# Patient Record
Sex: Female | Born: 1950 | Race: Black or African American | Hispanic: No | Marital: Married | Smoking: Former smoker
Health system: Southern US, Community
[De-identification: ages and names within clinical notes are randomized; demographics above are authoritative.]

## PROBLEM LIST (undated history)

## (undated) DIAGNOSIS — G4733 Obstructive sleep apnea (adult) (pediatric): Secondary | ICD-10-CM

## (undated) DIAGNOSIS — I051 Rheumatic mitral insufficiency: Secondary | ICD-10-CM

## (undated) DIAGNOSIS — R0600 Dyspnea, unspecified: Secondary | ICD-10-CM

## (undated) DIAGNOSIS — J189 Pneumonia, unspecified organism: Secondary | ICD-10-CM

## (undated) DIAGNOSIS — Z954 Presence of other heart-valve replacement: Secondary | ICD-10-CM

## (undated) DIAGNOSIS — E785 Hyperlipidemia, unspecified: Secondary | ICD-10-CM

## (undated) DIAGNOSIS — I5032 Chronic diastolic (congestive) heart failure: Secondary | ICD-10-CM

## (undated) DIAGNOSIS — Z9889 Other specified postprocedural states: Secondary | ICD-10-CM

## (undated) DIAGNOSIS — Z5189 Encounter for other specified aftercare: Secondary | ICD-10-CM

## (undated) DIAGNOSIS — I480 Paroxysmal atrial fibrillation: Secondary | ICD-10-CM

## (undated) DIAGNOSIS — I2510 Atherosclerotic heart disease of native coronary artery without angina pectoris: Secondary | ICD-10-CM

## (undated) DIAGNOSIS — D689 Coagulation defect, unspecified: Secondary | ICD-10-CM

## (undated) DIAGNOSIS — I272 Pulmonary hypertension, unspecified: Secondary | ICD-10-CM

## (undated) DIAGNOSIS — I639 Cerebral infarction, unspecified: Secondary | ICD-10-CM

## (undated) DIAGNOSIS — Z951 Presence of aortocoronary bypass graft: Secondary | ICD-10-CM

## (undated) DIAGNOSIS — T4145XA Adverse effect of unspecified anesthetic, initial encounter: Secondary | ICD-10-CM

## (undated) DIAGNOSIS — R011 Cardiac murmur, unspecified: Secondary | ICD-10-CM

## (undated) DIAGNOSIS — I050 Rheumatic mitral stenosis: Secondary | ICD-10-CM

## (undated) DIAGNOSIS — Z8679 Personal history of other diseases of the circulatory system: Secondary | ICD-10-CM

## (undated) DIAGNOSIS — I10 Essential (primary) hypertension: Secondary | ICD-10-CM

## (undated) DIAGNOSIS — T8859XA Other complications of anesthesia, initial encounter: Secondary | ICD-10-CM

## (undated) DIAGNOSIS — I4891 Unspecified atrial fibrillation: Secondary | ICD-10-CM

## (undated) HISTORY — PX: COLONOSCOPY W/ POLYPECTOMY: SHX1380

## (undated) HISTORY — DX: Coagulation defect, unspecified: D68.9

## (undated) HISTORY — DX: Rheumatic mitral stenosis: I05.0

## (undated) HISTORY — DX: Personal history of other diseases of the circulatory system: Z86.79

## (undated) HISTORY — DX: Unspecified atrial fibrillation: I48.91

## (undated) HISTORY — DX: Encounter for other specified aftercare: Z51.89

## (undated) HISTORY — PX: TUBAL LIGATION: SHX77

## (undated) HISTORY — DX: Other specified postprocedural states: Z98.890

## (undated) HISTORY — DX: Hyperlipidemia, unspecified: E78.5

## (undated) HISTORY — DX: Chronic diastolic (congestive) heart failure: I50.32

## (undated) HISTORY — DX: Obstructive sleep apnea (adult) (pediatric): G47.33

## (undated) HISTORY — DX: Cardiac murmur, unspecified: R01.1

---

## 1987-05-11 HISTORY — PX: APPENDECTOMY: SHX54

## 2006-05-10 HISTORY — PX: BALLOON VALVULOPLASTY: SHX5741

## 2012-05-10 DIAGNOSIS — D689 Coagulation defect, unspecified: Secondary | ICD-10-CM

## 2012-05-10 HISTORY — DX: Coagulation defect, unspecified: D68.9

## 2012-08-13 ENCOUNTER — Encounter (HOSPITAL_BASED_OUTPATIENT_CLINIC_OR_DEPARTMENT_OTHER): Payer: Self-pay | Admitting: *Deleted

## 2012-08-13 ENCOUNTER — Emergency Department (HOSPITAL_BASED_OUTPATIENT_CLINIC_OR_DEPARTMENT_OTHER)
Admission: EM | Admit: 2012-08-13 | Discharge: 2012-08-13 | Disposition: A | Discharge status: Home / Self Care | Payer: BC Managed Care – PPO | Attending: Emergency Medicine | Admitting: Emergency Medicine

## 2012-08-13 ENCOUNTER — Emergency Department (HOSPITAL_BASED_OUTPATIENT_CLINIC_OR_DEPARTMENT_OTHER): Payer: BC Managed Care – PPO

## 2012-08-13 DIAGNOSIS — R112 Nausea with vomiting, unspecified: Secondary | ICD-10-CM

## 2012-08-13 DIAGNOSIS — M545 Low back pain, unspecified: Secondary | ICD-10-CM | POA: Insufficient documentation

## 2012-08-13 DIAGNOSIS — Z8679 Personal history of other diseases of the circulatory system: Secondary | ICD-10-CM | POA: Insufficient documentation

## 2012-08-13 DIAGNOSIS — Z9089 Acquired absence of other organs: Secondary | ICD-10-CM | POA: Insufficient documentation

## 2012-08-13 DIAGNOSIS — R109 Unspecified abdominal pain: Secondary | ICD-10-CM | POA: Insufficient documentation

## 2012-08-13 DIAGNOSIS — Z79899 Other long term (current) drug therapy: Secondary | ICD-10-CM | POA: Insufficient documentation

## 2012-08-13 DIAGNOSIS — I10 Essential (primary) hypertension: Secondary | ICD-10-CM | POA: Insufficient documentation

## 2012-08-13 DIAGNOSIS — Z8673 Personal history of transient ischemic attack (TIA), and cerebral infarction without residual deficits: Secondary | ICD-10-CM | POA: Insufficient documentation

## 2012-08-13 DIAGNOSIS — Z7901 Long term (current) use of anticoagulants: Secondary | ICD-10-CM | POA: Insufficient documentation

## 2012-08-13 HISTORY — DX: Essential (primary) hypertension: I10

## 2012-08-13 HISTORY — DX: Cerebral infarction, unspecified: I63.9

## 2012-08-13 LAB — URINE MICROSCOPIC-ADD ON

## 2012-08-13 LAB — COMPREHENSIVE METABOLIC PANEL
AST: 44 U/L — ABNORMAL HIGH (ref 0–37)
Albumin: 4.4 g/dL (ref 3.5–5.2)
Alkaline Phosphatase: 108 U/L (ref 39–117)
BUN: 13 mg/dL (ref 6–23)
Chloride: 101 mEq/L (ref 96–112)
Creatinine, Ser: 0.8 mg/dL (ref 0.50–1.10)
Potassium: 3.5 mEq/L (ref 3.5–5.1)
Total Bilirubin: 1.1 mg/dL (ref 0.3–1.2)
Total Protein: 9.9 g/dL — ABNORMAL HIGH (ref 6.0–8.3)

## 2012-08-13 LAB — CBC WITH DIFFERENTIAL/PLATELET
Basophils Absolute: 0 10*3/uL (ref 0.0–0.1)
Basophils Relative: 0 % (ref 0–1)
Eosinophils Absolute: 0 10*3/uL (ref 0.0–0.7)
MCH: 27.8 pg (ref 26.0–34.0)
MCHC: 32.8 g/dL (ref 30.0–36.0)
Neutro Abs: 5.6 10*3/uL (ref 1.7–7.7)
Neutrophils Relative %: 82 % — ABNORMAL HIGH (ref 43–77)
RDW: 14.4 % (ref 11.5–15.5)

## 2012-08-13 LAB — URINALYSIS, ROUTINE W REFLEX MICROSCOPIC
Glucose, UA: NEGATIVE mg/dL
Hgb urine dipstick: NEGATIVE
Leukocytes, UA: NEGATIVE
Specific Gravity, Urine: 1.016 (ref 1.005–1.030)
pH: 6 (ref 5.0–8.0)

## 2012-08-13 LAB — LIPASE, BLOOD: Lipase: 16 U/L (ref 11–59)

## 2012-08-13 MED ORDER — HYDROMORPHONE HCL PF 1 MG/ML IJ SOLN
0.5000 mg | Freq: Once | INTRAMUSCULAR | Status: AC
  Administered 2012-08-13: 0.5 mg via INTRAVENOUS
  Filled 2012-08-13: qty 1

## 2012-08-13 MED ORDER — SODIUM CHLORIDE 0.9 % IV SOLN
1000.0000 mL | Freq: Once | INTRAVENOUS | Status: AC
  Administered 2012-08-13: 1000 mL via INTRAVENOUS

## 2012-08-13 MED ORDER — HYDROCODONE-ACETAMINOPHEN 5-325 MG PO TABS: 1.0000 | ORAL_TABLET | Freq: Three times a day (TID) | ORAL | Status: DC | PRN

## 2012-08-13 MED ORDER — ONDANSETRON HCL 4 MG/2ML IJ SOLN
4.0000 mg | Freq: Once | INTRAMUSCULAR | Status: AC
  Administered 2012-08-13: 4 mg via INTRAVENOUS
  Filled 2012-08-13: qty 2

## 2012-08-13 MED ORDER — METOCLOPRAMIDE HCL 10 MG PO TABS: 10.0000 mg | ORAL_TABLET | Freq: Four times a day (QID) | ORAL | Status: DC | PRN

## 2012-08-13 NOTE — ED Provider Notes (Signed)
History  This chart was scribed for Carmin Muskrat, MD by Roe Coombs, ED Scribe. The patient was seen in room MH01/MH01. Patient's care was started at 1549.   CSN: [id number]  Arrival date & time 08/13/12  1527   First MD Initiated Contact with Patient 08/13/12 1549      Chief Complaint  Patient presents with  . Abdominal Pain    The history is provided by the patient and a relative (daughter). No language interpreter was used.    HPI Comments: Marie Malone is a 62 y.o. female with history of hypertension and stroke who presents to the Emergency Department complaining of constant, moderate, right-sided and epigastric abdominal pain onset this morning. There is associated right lower back pain, nausea and vomiting. She had a couple of episodes of bilious emesis thi smorning and says that abdominal pain improved transiently after these episodes. Patient has been able to tolerate small amounts of water, but has had decreased appetite. She also reports that she has had some mild chest pain when abdominal pain is most severe, but states that this is not uncommon. Patient should be taking Warfarin daily. Daughter reports that patient is not always compliant with medicine regimen. She has a surgical history of angioplasty and appendectomy. There is no history of cholecystectomy. Patient does not smoke or use alcohol.    Past Medical History  Diagnosis Date  . Hypertension   . Stroke   . Rheumatic fever     Past Surgical History  Procedure Laterality Date  . Cardiac surgery    . Appendectomy      History reviewed. No pertinent family history.  History  Substance Use Topics  . Smoking status: Never Smoker   . Smokeless tobacco: Not on file  . Alcohol Use: No    OB History   Grav Para Term Preterm Abortions TAB SAB Ect Mult Living                  Review of Systems A complete 10 system review of systems was obtained and all systems are negative except as noted in the HPI and  PMH.   Allergies  Review of patient's allergies indicates no known allergies.  Home Medications   Current Outpatient Rx  Name  Route  Sig  Dispense  Refill  . BENZTROPINE MESYLATE PO   Oral   Take by mouth.         . METOPROLOL SUCCINATE ER PO   Oral   Take by mouth.         . WARFARIN SODIUM PO   Oral   Take by mouth.           Triage Vitals: BP 189/91  Pulse 82  Temp(Src) 98.9 F (37.2 C) (Oral)  Resp 18  Ht 5\' 2"  (1.575 m)  Wt 184 lb (83.462 kg)  BMI 33.65 kg/m2  SpO2 98%  Physical Exam  Nursing note and vitals reviewed. Constitutional: She is oriented to person, place, and time. She appears well-developed and well-nourished. No distress.  HENT:  Head: Normocephalic and atraumatic.  Eyes: Conjunctivae and EOM are normal.  Cardiovascular: Normal rate and regular rhythm.   Pulmonary/Chest: Effort normal and breath sounds normal. No stridor. No respiratory distress.  Abdominal: Soft. Bowel sounds are normal. She exhibits no distension. There is tenderness in the right upper quadrant and epigastric area.  Musculoskeletal: She exhibits no edema.  Neurological: She is alert and oriented to person, place, and time. No cranial nerve deficit.  Skin: Skin is warm and dry.  Psychiatric: She has a normal mood and affect.    ED Course  Procedures (including critical care time) DIAGNOSTIC STUDIES: Oxygen Saturation is 98% on room air, normal by my interpretation.    COORDINATION OF CARE: 4:09 PM- Patient informed of current plan for treatment and evaluation and agrees with plan at this time. Daughter is bedside.   Labs Reviewed  URINALYSIS, ROUTINE W REFLEX MICROSCOPIC - Abnormal; Notable for the following:    Ketones, ur 15 (*)    Protein, ur 30 (*)    All other components within normal limits  URINE MICROSCOPIC-ADD ON - Abnormal; Notable for the following:    Squamous Epithelial / LPF FEW (*)    Bacteria, UA FEW (*)    Casts HYALINE CASTS (*)    All  other components within normal limits  CBC WITH DIFFERENTIAL  COMPREHENSIVE METABOLIC PANEL  LIPASE, BLOOD    US Abdomen Complete  08/13/2012  *RADIOLOGY REPORT*  Clinical Data:  Abdominal pain and nausea.  COMPLETE ABDOMINAL ULTRASOUND  Comparison:  None.  Findings:  Gallbladder:  No shadowing gallstones or echogenic sludge.  No gallbladder wall thickening or pericholecystic fluid.  No sonographic Murphy's sign according to the ultrasound technologist. Wall thickness is 1.3 mm, within normal limits.  Common bile duct:  Normal in caliber. No biliary ductal dilation. The maximal diameter is 5.9 mm.  Liver:  The parenchyma is somewhat heterogeneous.  There may be mild fatty infiltration.  No discrete lesion is evident.  IVC:  Appears normal.  Pancreas:  Although the pancreas is difficult to visualize in its entirety, no focal pancreatic abnormality is identified.  Spleen:  Normal size and echotexture without focal parenchymal abnormality.  The maximal diameter is 6.7 cm.  Right Kidney:  No hydronephrosis.  Well-preserved cortex.  Normal size and parenchymal echotexture without focal abnormalities. The maximal length is 11.5 cm, within normal limits.  Left Kidney:  No hydronephrosis.  Well-preserved cortex.  Normal size and parenchymal echotexture without focal abnormalities. The maximal length is 9.9 cm, within normal limits.  Abdominal aorta:  Atherosclerotic changes are present without an aneurysm.  IMPRESSION: Negative abdominal ultrasound.   Original Report Authenticated By: San Morelle, M.D.      No diagnosis found.  6:38 PM Patient sleeping  I hope the patient, discussed the results with her and her daughter.  (The patient has capacity to follow up with her physician, here.  The patient has no new complaints, remains in no distress, with no hypoxia, evidence of decompensation.   MDM   I personally performed the services described in this documentation, which was scribed in my presence.  The recorded information has been reviewed and is accurate.   Patient presents with abdominal pain, nausea, vomiting.  Notably on my initial exam, and on subsequent exam she was in no distress, with stable vital signs.  She was mildly hypertensive initially, but this improved.  The patient's labs are notable for mild ketonuria, with no overt evidence of systemic infection.  Given the patient's description of nausea, vomiting, bilious emesis, ultrasound was conducted.  This was unremarkable.  However, this presentation seems most consistent with biliary colic.  The patient is a discharged in stable condition with analgesics, antiemetics, primary care followup for further evaluation and management.  Carmin Muskrat, MD 08/13/12 [phone]

## 2012-08-13 NOTE — ED Notes (Signed)
Pt c/o RLQ pain radiating to flank with N/V/D onset this a.m.

## 2012-08-30 ENCOUNTER — Encounter: Payer: Self-pay | Admitting: Family

## 2012-08-30 ENCOUNTER — Ambulatory Visit (INDEPENDENT_AMBULATORY_CARE_PROVIDER_SITE_OTHER): Payer: BC Managed Care – PPO | Admitting: Family

## 2012-08-30 VITALS — BP 140/100 | HR 96 | Temp 98.3°F | Resp 16 | Ht 63.0 in | Wt 180.1 lb

## 2012-08-30 DIAGNOSIS — Z8619 Personal history of other infectious and parasitic diseases: Secondary | ICD-10-CM

## 2012-08-30 DIAGNOSIS — E785 Hyperlipidemia, unspecified: Secondary | ICD-10-CM

## 2012-08-30 DIAGNOSIS — I10 Essential (primary) hypertension: Secondary | ICD-10-CM

## 2012-08-30 DIAGNOSIS — R079 Chest pain, unspecified: Secondary | ICD-10-CM | POA: Insufficient documentation

## 2012-08-30 DIAGNOSIS — Z8679 Personal history of other diseases of the circulatory system: Secondary | ICD-10-CM

## 2012-08-30 LAB — BASIC METABOLIC PANEL WITH GFR
CO2: 28 mEq/L (ref 19–32)
Calcium: 9.8 mg/dL (ref 8.4–10.5)
Chloride: 107 mEq/L (ref 96–112)
Glucose, Bld: 86 mg/dL (ref 70–99)
Sodium: 142 mEq/L (ref 135–145)

## 2012-08-30 LAB — LIPID PANEL
LDL Cholesterol: 107 mg/dL — ABNORMAL HIGH (ref 0–99)
Total CHOL/HDL Ratio: 3.4 Ratio
VLDL: 17 mg/dL (ref 0–40)

## 2012-08-30 LAB — HEPATIC FUNCTION PANEL
ALT: 16 U/L (ref 0–35)
AST: 22 U/L (ref 0–37)
Alkaline Phosphatase: 79 U/L (ref 39–117)
Bilirubin, Direct: 0.2 mg/dL (ref 0.0–0.3)
Total Bilirubin: 0.7 mg/dL (ref 0.3–1.2)

## 2012-08-30 MED ORDER — METOPROLOL SUCCINATE ER 50 MG PO TB24: 50.0000 mg | ORAL_TABLET | Freq: Every day | ORAL | Status: DC

## 2012-08-30 NOTE — Assessment & Plan Note (Signed)
We do not have any records re: her hx of valvular heart disease. Will request records from [facility].  Obtain 2-D echo.  Add aspirin 325mg . Will refer to cardiology. Defer coumadin therapy to cardiology.

## 2012-08-30 NOTE — Progress Notes (Signed)
Subjective:    Patient ID: Marie Malone, female    DOB: 1950/11/30, 62 y.o.   MRN: [id number]  HPI  Marie Malone is a 62 yr old female who presents today to establish care. 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  She was hospitalized at [facility] for this. She reports that this was followed by several TIA's.    Hx Rheumatic fever/valve problem- she reports that she had a "balloon procedure" for the valve performed at [facility] in 2008. She reports that she has occasional DOE.  She reports occasional chest pain with exertion.   This improves with rest.  She is not currently on coumadin- reports that she is supposed to be on the coumadin.    HTN-  Reports that she was first treated for HTN since in her 53's.       Review of Systems  Constitutional: Negative for unexpected weight change.  HENT: Negative for hearing loss and congestion.   Eyes: Negative for visual disturbance.  Respiratory: Negative for cough.   Cardiovascular:       See HPI  Gastrointestinal: Negative for nausea, vomiting and diarrhea.  Genitourinary: Negative for dysuria and hematuria.  Musculoskeletal: Negative for myalgias and arthralgias.  Skin: Negative for rash.  Neurological: Negative for headaches.  Hematological: Negative for adenopathy.  Psychiatric/Behavioral:       Denies depression/anxiety   Past Medical History  Diagnosis Date  . Hypertension   . Stroke   . Rheumatic fever   . History of rheumatic fever     as a child  . Hyperlipidemia   . Heart murmur     History   Social History  . Marital Status: Married    Spouse Name: N/A    Number of Children: N/A  . Years of Education: N/A   Occupational History  . Not on file.   Social History Main Topics  . Smoking status: Never Smoker   . Smokeless tobacco: Not on file  . Alcohol Use: No  . Drug Use: No  . Sexually Active: Not on file   Other Topics Concern  . Not on file   Social History Narrative    Retired Archivist   Married   She has 2 grown children-   Daughter lives with her   Son lives in [place]   6 grandchildren    Past Surgical History  Procedure Laterality Date  . Cardiac surgery  2008    "balloon surgery at [facility]"  . Appendectomy  1989    Family History  Problem Relation Age of Onset  . Diabetes Mother   . Stroke Mother   . Diabetes Father   . Heart disease Father   . Cancer Father     kidney    No Known Allergies  No current outpatient prescriptions on file prior to visit.   No current facility-administered medications on file prior to visit.    BP 140/100  Pulse 96  Temp(Src) 98.3 F (36.8 C) (Oral)  Resp 16  SpO2 98%       Objective:   Physical Exam  Constitutional: She is oriented to person, place, and time. She appears well-developed and well-nourished. No distress.  HENT:  Head: Normocephalic and atraumatic.  Eyes: No scleral icterus.  Cardiovascular: Normal rate and regular rhythm.   No murmur heard. Pulmonary/Chest: Effort normal and breath sounds normal. No respiratory distress. She has no wheezes. She has no rales. She exhibits  no tenderness.  Musculoskeletal: She exhibits no edema.  Lymphadenopathy:    She has no cervical adenopathy.  Neurological: She is alert and oriented to person, place, and time.  Psychiatric: She has a normal mood and affect. Her behavior is normal. Judgment and thought content normal.          Assessment & Plan:

## 2012-08-30 NOTE — Assessment & Plan Note (Signed)
Will add Toprol XL for BP control.

## 2012-08-30 NOTE — Patient Instructions (Addendum)
Complete lab work prior to leaving. You will be contacted about your stress test, echocardiogram, and referral to cardiology. Go to ER if you develop severe or worsening chest pain. Please follow up with [place] in 1 month for a complete physical and blood pressure check.  Welcome to [facility]!

## 2012-08-30 NOTE — Assessment & Plan Note (Signed)
Will obtain stress test.

## 2012-09-04 ENCOUNTER — Ambulatory Visit (HOSPITAL_COMMUNITY): Payer: BC Managed Care – PPO | Attending: Cardiology | Admitting: Radiology

## 2012-09-04 VITALS — BP 166/88 | Ht 62.0 in | Wt 186.0 lb

## 2012-09-04 DIAGNOSIS — R0989 Other specified symptoms and signs involving the circulatory and respiratory systems: Secondary | ICD-10-CM | POA: Insufficient documentation

## 2012-09-04 DIAGNOSIS — E785 Hyperlipidemia, unspecified: Secondary | ICD-10-CM | POA: Insufficient documentation

## 2012-09-04 DIAGNOSIS — R079 Chest pain, unspecified: Secondary | ICD-10-CM | POA: Insufficient documentation

## 2012-09-04 DIAGNOSIS — Z8673 Personal history of transient ischemic attack (TIA), and cerebral infarction without residual deficits: Secondary | ICD-10-CM | POA: Insufficient documentation

## 2012-09-04 DIAGNOSIS — Z8249 Family history of ischemic heart disease and other diseases of the circulatory system: Secondary | ICD-10-CM | POA: Insufficient documentation

## 2012-09-04 DIAGNOSIS — I10 Essential (primary) hypertension: Secondary | ICD-10-CM | POA: Insufficient documentation

## 2012-09-04 DIAGNOSIS — R0609 Other forms of dyspnea: Secondary | ICD-10-CM | POA: Insufficient documentation

## 2012-09-04 MED ORDER — TECHNETIUM TC 99M SESTAMIBI GENERIC - CARDIOLITE
33.0000 | Freq: Once | INTRAVENOUS | Status: AC | PRN
  Administered 2012-09-04: 33 via INTRAVENOUS

## 2012-09-04 MED ORDER — TECHNETIUM TC 99M SESTAMIBI GENERIC - CARDIOLITE
11.0000 | Freq: Once | INTRAVENOUS | Status: AC | PRN
  Administered 2012-09-04: 11 via INTRAVENOUS

## 2012-09-04 MED ORDER — REGADENOSON 0.4 MG/5ML IV SOLN
0.4000 mg | Freq: Once | INTRAVENOUS | Status: AC
  Administered 2012-09-04: 0.4 mg via INTRAVENOUS

## 2012-09-04 NOTE — Progress Notes (Signed)
[facility] [address] [place], [place] [address] [phone]    Cardiology Nuclear Med Study  Marie Malone is a 62 y.o. female     MRN : [id number]     DOB: Aug 28, 1950  Procedure Date: 09/04/2012  Nuclear Med Background Indication for Stress Test:  Evaluation for Ischemia History:  Rheumatic fever: valve problem H/O balloon procedure on valve at [facility] '08 Cardiac Risk Factors: CVA, Family History - CAD, Hypertension, Lipids and TIA  Symptoms:  Chest Pain and DOE   Nuclear Pre-Procedure Caffeine/Decaff Intake:  None NPO After: 11:00pm   Lungs:  clear O2 Sat: 97% on room air. IV 0.9% NS with Angio Cath:  22g  IV Site: R Wrist  IV Started by:  Eliezer Lofts, EMT-P  Chest Size (in):  38 Cup Size: C  Height: 5\' 2"  (1.575 m)  Weight:  186 lb (84.369 kg)  BMI:  Body mass index is 34.01 kg/(m^2). Tech Comments:  Toprol held this am, per patient.    Nuclear Med Study 1 or 2 day study: 1 day  Stress Test Type:  Carlton Adam  Reading MD: [name], MD  Order Authorizing Provider:  Erline Levine Blyth/ Debbrah Alar  Resting Radionuclide: Technetium 60m Sestamibi  Resting Radionuclide Dose: 11.0 mCi   Stress Radionuclide:  Technetium 65m Sestamibi  Stress Radionuclide Dose: 33.0 mCi           Stress Protocol Rest HR: 63 Stress HR: 72  Rest BP: 166/88 Stress BP: 167/96  Exercise Time (min): n/a METS: n/a   Predicted Max HR: 160 bpm % Max HR: 45 bpm Rate Pressure Product: 12024   Dose of Adenosine (mg):  n/a Dose of Lexiscan: 0.4 mg  Dose of Atropine (mg): n/a Dose of Dobutamine: n/a mcg/kg/min (at max HR)  Stress Test Technologist: Perrin Maltese, EMT-P  Nuclear Technologist:  Charlton Amor, CNMT     Rest Procedure:  Myocardial perfusion imaging was performed at rest 45 minutes following the intravenous administration of Technetium 67m Sestamibi. Rest ECG: NSR, first degree AV block  Stress Procedure:  The patient received IV Lexiscan  0.4 mg over 15-seconds.  Technetium 60m Sestamibi injected at 30-seconds. This patient had sob with the Lexiscan injection. Quantitative spect images were obtained after a 45 minute delay. Stress ECG: No significant ST segment change suggestive of ischemia.  QPS Raw Data Images:  Acquisition technically good; normal left ventricular size. Stress Images:  There is decreased uptake in the anterior wall. Rest Images:  There is decreased uptake in the anterior wall. Subtraction (SDS):  No evidence of ischemia. Transient Ischemic Dilatation (Normal <1.22):  1.08 Lung/Heart Ratio (Normal <0.45):  0.23  Quantitative Gated Spect Images QGS EDV:  71 ml QGS ESV:  31 ml  Impression Exercise Capacity:  Lexiscan with no exercise. BP Response:  Normal blood pressure response. Clinical Symptoms:  There is dyspnea. ECG Impression:  No significant ST segment change suggestive of ischemia. Comparison with Prior Nuclear Study: No images to compare  Overall Impression:  Low risk stress nuclear study with a moderate size, mild intensity, fixed anterior defect consistent with soft tissue attenauation; no ischemia.  LV Ejection Fraction: 56%.  LV Wall Motion:  NL LV Function; NL Wall Motion  [name]

## 2012-09-06 ENCOUNTER — Ambulatory Visit (HOSPITAL_BASED_OUTPATIENT_CLINIC_OR_DEPARTMENT_OTHER)
Admission: RE | Admit: 2012-09-06 | Discharge: 2012-09-06 | Disposition: A | Discharge status: Home / Self Care | Payer: BC Managed Care – PPO | Source: Ambulatory Visit | Attending: Family | Admitting: Family

## 2012-09-06 DIAGNOSIS — I10 Essential (primary) hypertension: Secondary | ICD-10-CM | POA: Insufficient documentation

## 2012-09-06 DIAGNOSIS — R079 Chest pain, unspecified: Secondary | ICD-10-CM | POA: Insufficient documentation

## 2012-09-06 DIAGNOSIS — Z8679 Personal history of other diseases of the circulatory system: Secondary | ICD-10-CM

## 2012-09-06 DIAGNOSIS — I359 Nonrheumatic aortic valve disorder, unspecified: Secondary | ICD-10-CM

## 2012-09-06 NOTE — Progress Notes (Signed)
  Echocardiogram 2D Echocardiogram has been performed.  Philipp Deputy 09/06/2012, 12:40 PM

## 2012-09-11 ENCOUNTER — Telehealth: Payer: Self-pay | Admitting: Family

## 2012-09-11 NOTE — Telephone Encounter (Signed)
Received medical records from [facility] °

## 2012-09-13 ENCOUNTER — Encounter: Payer: Self-pay | Admitting: Family

## 2012-09-22 ENCOUNTER — Ambulatory Visit (INDEPENDENT_AMBULATORY_CARE_PROVIDER_SITE_OTHER): Payer: BC Managed Care – PPO | Admitting: Family

## 2012-09-22 ENCOUNTER — Other Ambulatory Visit (HOSPITAL_COMMUNITY)
Admission: RE | Admit: 2012-09-22 | Discharge: 2012-09-22 | Disposition: A | Discharge status: Home / Self Care | Payer: BC Managed Care – PPO | Source: Ambulatory Visit | Attending: Family | Admitting: Family

## 2012-09-22 ENCOUNTER — Encounter: Payer: Self-pay | Admitting: Family

## 2012-09-22 VITALS — BP 140/100 | HR 50 | Temp 98.6°F | Resp 16 | Ht 63.0 in | Wt 181.1 lb

## 2012-09-22 DIAGNOSIS — Z0000 Encounter for general adult medical examination without abnormal findings: Secondary | ICD-10-CM

## 2012-09-22 DIAGNOSIS — Z01419 Encounter for gynecological examination (general) (routine) without abnormal findings: Secondary | ICD-10-CM

## 2012-09-22 DIAGNOSIS — E8809 Other disorders of plasma-protein metabolism, not elsewhere classified: Secondary | ICD-10-CM

## 2012-09-22 DIAGNOSIS — I10 Essential (primary) hypertension: Secondary | ICD-10-CM

## 2012-09-22 DIAGNOSIS — B360 Pityriasis versicolor: Secondary | ICD-10-CM

## 2012-09-22 LAB — CBC WITH DIFFERENTIAL/PLATELET
Basophils Absolute: 0.1 10*3/uL (ref 0.0–0.1)
Basophils Relative: 2 % — ABNORMAL HIGH (ref 0–1)
Eosinophils Absolute: 0.1 10*3/uL (ref 0.0–0.7)
Hemoglobin: 13.7 g/dL (ref 12.0–15.0)
MCH: 26.4 pg (ref 26.0–34.0)
MCHC: 32.3 g/dL (ref 30.0–36.0)
Monocytes Relative: 12 % (ref 3–12)
Neutrophils Relative %: 50 % (ref 43–77)
RDW: 15.4 % (ref 11.5–15.5)

## 2012-09-22 LAB — TSH: TSH: 1.403 u[IU]/mL (ref 0.350–4.500)

## 2012-09-22 LAB — PROTEIN, TOTAL: Total Protein: 8.5 g/dL — ABNORMAL HIGH (ref 6.0–8.3)

## 2012-09-22 MED ORDER — SELENIUM SULFIDE 2.5 % EX LOTN: TOPICAL_LOTION | CUTANEOUS | Status: DC

## 2012-09-22 MED ORDER — AMLODIPINE BESYLATE 5 MG PO TABS: 5.0000 mg | ORAL_TABLET | Freq: Every day | ORAL | Status: DC

## 2012-09-22 NOTE — Assessment & Plan Note (Signed)
New. Will rx with selsun.

## 2012-09-22 NOTE — Assessment & Plan Note (Signed)
Recommended healthy diet/exerise. Due for zostavax- she will check coverage with her insurer.  Declines colo at this time. Agreeable to mammo, dexa, labs.  Pap performed today with chaperone.

## 2012-09-22 NOTE — Progress Notes (Signed)
Subjective:    Patient ID: Marie Malone, female    DOB: [birth year], 62 y.o.   MRN: [id number]  HPI  Patient presents today for complete physical.  Immunizations: Reports tetanus shot was 2-3 years ago.  Due for zostavax. Diet: diet is OK. Exercise: exercises some days.   Colonoscopy: 5 yrs ago. She reports that this was done in [place]. Can't remember which doctor did the colo. Per pt they did remove a polyp.   Dexa: Due  Pap Smear: last pap was more than 3 yrs ago.   Mammogram: due  HTN- last visit toprol xl was added.  Tolerating med.   BP Readings from Last 3 Encounters:  09/22/12 140/100  09/04/12 166/88  08/30/12 140/100     Review of Systems     Past Medical History  Diagnosis Date  . Hypertension   . Stroke   . Rheumatic fever   . History of rheumatic fever     as a child  . Hyperlipidemia   . Heart murmur   . OSA (obstructive sleep apnea)     mild per sleep study 2008    History   Social History  . Marital Status: Married    Spouse Name: N/A    Number of Children: N/A  . Years of Education: N/A   Occupational History  . Not on file.   Social History Main Topics  . Smoking status: Never Smoker   . Smokeless tobacco: Not on file  . Alcohol Use: No  . Drug Use: No  . Sexually Active: Not on file   Other Topics Concern  . 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 Cardiac surgery  2008    "balloon surgery at [facility]"  . Appendectomy  1989    Family History  Problem Relation Age of Onset  . Diabetes Mother   . Stroke Mother   . Diabetes Father   . Heart disease Father   . Cancer Father     kidney    No Known Allergies  Current Outpatient Prescriptions on File Prior to Visit  Medication Sig Dispense Refill  . aspirin 325 MG EC tablet Take  325 mg by mouth daily.      . metoprolol succinate (TOPROL-XL) 50 MG 24 hr tablet Take 1 tablet (50 mg total) by mouth daily. Take with or immediately following a meal.  30 tablet  2   No current facility-administered medications on file prior to visit.    BP 140/100  Pulse 50  Temp(Src) 98.6 F (37 C) (Oral)  Resp 16  Ht 5\' 3"  (1.6 m)  Wt 181 lb 1.9 oz (82.155 kg)  BMI 32.09 kg/m2  SpO2 99%    Objective:   Physical Exam  Physical Exam  Constitutional: She is oriented to person, place, and time. She appears well-developed and well-nourished. No distress.  HENT:  Head: Normocephalic and atraumatic.  Right Ear: Tympanic membrane and ear canal normal.  Left Ear: Tympanic membrane and ear canal normal.  Mouth/Throat: Oropharynx is clear and moist.  Eyes: Pupils are equal, round, and reactive to light. No scleral icterus.  Neck: Normal range of motion. No thyromegaly present.  Cardiovascular: Normal rate and regular rhythm.   No murmur  heard. Pulmonary/Chest: Effort normal and breath sounds normal. No respiratory distress. He has no wheezes. She has no rales. She exhibits no tenderness.  Abdominal: Soft. Bowel sounds are normal. He exhibits no distension and no mass. There is no tenderness. There is no rebound and no guarding.  Musculoskeletal: She exhibits no edema.  Lymphadenopathy:    She has no cervical adenopathy.  Neurological: She is alert and oriented to person, place, and time.  She exhibits normal muscle tone. Coordination normal.  Skin: Skin is warm and dry. Hyperpigmented patches noted on back/chest Psychiatric: She has a normal mood and affect. Her behavior is normal. Judgment and thought content normal.  Breasts: Examined lying Right: Without masses, retractions, discharge or axillary adenopathy.  Left: Without masses, retractions, discharge or axillary adenopathy.  Inguinal/mons: Normal without inguinal adenopathy  External genitalia: Normal  BUS/Urethra/Skene's  glands: Normal  Bladder: Normal  Vagina: Normal  Cervix: Normal  Uterus: normal in size, shape and contour. Midline and mobile  Adnexa/parametria:  Rt: Without masses or tenderness.  Lt: Without masses or tenderness.  Anus and perineum: Normal           Assessment & Plan:         Assessment & Plan:

## 2012-09-22 NOTE — Patient Instructions (Addendum)
Please schedule mammogram on the first floor and bone density at the front desk. Let me know when you are ready to schedule your colonoscopy. Complete blood work prior to leaving. Cut Metoprolol 50mg  and take 1/2 tab (25mg ) once daily.  In addition, we are adding amlodipine 5 mg once daily.  Check with your insurance to see if they will cover Zostavax (shingles vaccine).  If so, we can give this to you next visit.  Follow up in 1 month.

## 2012-09-22 NOTE — Addendum Note (Signed)
Addended by: Kelle Darting A on: 09/22/2012 11:16 AM   Modules accepted: Orders

## 2012-09-22 NOTE — Assessment & Plan Note (Signed)
Some improvement with toprol, but still above goal. She is noted to be bradycardic (HR 50) since starting toprol. Will cut toprol down to 25 mg once daily, add amlodipine.

## 2012-09-23 LAB — URINALYSIS, ROUTINE W REFLEX MICROSCOPIC
Bilirubin Urine: NEGATIVE
Glucose, UA: NEGATIVE mg/dL
Hgb urine dipstick: NEGATIVE
Ketones, ur: NEGATIVE mg/dL
Leukocytes, UA: NEGATIVE
Nitrite: NEGATIVE
Protein, ur: NEGATIVE mg/dL
Specific Gravity, Urine: 1.022 (ref 1.005–1.030)
Urobilinogen, UA: 1 mg/dL (ref 0.0–1.0)
pH: 6 (ref 5.0–8.0)

## 2012-09-26 ENCOUNTER — Encounter: Payer: Self-pay | Admitting: Family

## 2012-09-26 ENCOUNTER — Telehealth: Payer: Self-pay | Admitting: Family

## 2012-09-26 DIAGNOSIS — E8809 Other disorders of plasma-protein metabolism, not elsewhere classified: Secondary | ICD-10-CM

## 2012-09-26 NOTE — Telephone Encounter (Addendum)
Thyroid function is normal. Protein remains elevated.  I would like her to return for spep/upep (pended below).  Pap smear is normal.

## 2012-09-27 NOTE — Telephone Encounter (Signed)
Left message for patient to return call.

## 2012-09-29 NOTE — Telephone Encounter (Signed)
Left message on voicemail to return my call.  

## 2012-10-03 NOTE — Telephone Encounter (Signed)
Attempted to reach pt and left detailed message on home # re: below results/instructions and to call if any questions.

## 2012-10-10 ENCOUNTER — Encounter: Payer: Self-pay | Admitting: *Deleted

## 2012-10-24 ENCOUNTER — Ambulatory Visit: Payer: BC Managed Care – PPO | Admitting: Family

## 2012-10-24 ENCOUNTER — Encounter: Payer: Self-pay | Admitting: Cardiology

## 2012-10-24 ENCOUNTER — Encounter: Payer: Self-pay | Admitting: *Deleted

## 2012-10-25 ENCOUNTER — Ambulatory Visit (INDEPENDENT_AMBULATORY_CARE_PROVIDER_SITE_OTHER): Payer: BC Managed Care – PPO | Admitting: Family

## 2012-10-25 ENCOUNTER — Ambulatory Visit (INDEPENDENT_AMBULATORY_CARE_PROVIDER_SITE_OTHER): Payer: BC Managed Care – PPO | Admitting: Cardiology

## 2012-10-25 ENCOUNTER — Encounter: Payer: Self-pay | Admitting: Family

## 2012-10-25 ENCOUNTER — Encounter: Payer: Self-pay | Admitting: Cardiology

## 2012-10-25 VITALS — BP 158/80 | HR 70 | Temp 98.4°F | Wt 182.8 lb

## 2012-10-25 VITALS — BP 150/82 | HR 72 | Wt 182.0 lb

## 2012-10-25 DIAGNOSIS — I10 Essential (primary) hypertension: Secondary | ICD-10-CM

## 2012-10-25 DIAGNOSIS — R079 Chest pain, unspecified: Secondary | ICD-10-CM

## 2012-10-25 DIAGNOSIS — I050 Rheumatic mitral stenosis: Secondary | ICD-10-CM

## 2012-10-25 MED ORDER — AMLODIPINE BESYLATE 10 MG PO TABS: 10.0000 mg | ORAL_TABLET | Freq: Every day | ORAL | Status: DC

## 2012-10-25 MED ORDER — METOPROLOL SUCCINATE ER 25 MG PO TB24: 25.0000 mg | ORAL_TABLET | Freq: Every day | ORAL | Status: DC

## 2012-10-25 NOTE — Progress Notes (Signed)
HPI: 62 year old female for evaluation of chest pain. Patient has a history of rheumatic fever. Cardiac catheterization in [facility] in June of 2008 showed normal LV function, no coronary disease, moderate mitral stenosis with a valve area of 1.1 cm, and severe pulmonary hypertension. TEE in [facility] in 2008 showed normal LV function and moderate mitral stenosis with a valve area of 1.4 cm, mild MR. Patient had valvuloplasty at [facility] in 2008. Echocardiogram in April of 2014 revealed normal LV function, mild aortic stenosis with a mean gradient of 8 mm of mercury, mild mitral stenosis, mild mitral regurgitation and mild biatrial enlargement. Nuclear study in April of 2014 showed an ejection fraction of 56%. There was a fixed anterior defect consistent with soft tissue attenuation but no ischemia. Abdominal ultrasound in April of 2014 normal. Patient describes occasional chest pain for the past one year. It is similar to her symptoms prior to her valvuloplasty. It is in the left chest area and described as a squeezing. It does not radiate. It occurs both with exertion and at rest. Not pleuritic, positional or related to food. It can last several days at a time. Improves with Tylenol and has also improved with recent addition of blood pressure medications. She has mild dyspnea on exertion but no orthopnea or PND or pedal edema. No syncope.  Current Outpatient Prescriptions  Medication Sig Dispense Refill  . amLODipine (NORVASC) 10 MG tablet Take 1 tablet (10 mg total) by mouth daily.  30 tablet  5  . aspirin 325 MG EC tablet Take 325 mg by mouth daily.      . metoprolol succinate (TOPROL-XL) 25 MG 24 hr tablet Take 1 tablet (25 mg total) by mouth daily.  30 tablet  5  . selenium sulfide (SELSUN) 2.5 % shampoo Massage daily into damp skin for seven days. Leave on for 10 minutes then rinse.  118 mL  0   No current facility-administered medications for this visit.    No Known Allergies  Past Medical  History  Diagnosis Date  . Hypertension   . Stroke   . History of rheumatic fever     as a child  . Hyperlipidemia   . Mitral stenosis   . OSA (obstructive sleep apnea)     mild per sleep study 2008    Past Surgical History  Procedure Laterality Date  . Cardiac surgery  2008    "balloon surgery at [facility]"  . Appendectomy  1989    History   Social History  . Marital Status: Married    Spouse Name: N/A    Number of Children: 2  . Years of Education: N/A   Occupational History  . Not on file.   Social History Main Topics  . Smoking status: Never Smoker   . Smokeless tobacco: Not on file  . Alcohol Use: No  . Drug Use: No  . Sexually Active: Not on file   Other Topics Concern  . 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 Diabetes Mother   . Stroke Mother   . Diabetes Father   . Heart disease Father     CHF  . Cancer Father     kidney    ROS: no fevers  or chills, productive cough, hemoptysis, dysphasia, odynophagia, melena, hematochezia, dysuria, hematuria, rash, seizure activity, orthopnea, PND, pedal edema, claudication. Remaining systems are negative.  Physical Exam:   Blood pressure 150/82, pulse 72, weight 182 lb (82.555 kg).  General:  Well developed/well nourished in NAD Skin warm/dry Patient not depressed No peripheral clubbing Back-normal HEENT-normal/normal eyelids Neck supple/normal carotid upstroke bilaterally; no bruits; no JVD; no thyromegaly chest - CTA/ normal expansion CV - RRR/normal S1 and S2; no rubs or gallops;  PMI nondisplaced, 2/6 systolic murmur apex. Abdomen -NT/ND, no HSM, no mass, + bowel sounds, no bruit 2+ femoral pulses, no bruits Ext-no edema, no chords, 2+ DP, varicosities noted. Neuro-grossly nonfocal  ECG 08/30/2012-sinus  rhythm, first degree AV block, left axis deviation.

## 2012-10-25 NOTE — Assessment & Plan Note (Signed)
History of valvuloplasty. Most recent echo shows mild mitral stenosis. Plan repeat study in one year.

## 2012-10-25 NOTE — Patient Instructions (Addendum)
Please follow up in 1 month.  

## 2012-10-25 NOTE — Assessment & Plan Note (Signed)
Blood pressure is mildly elevated. Her Norvasc was increased by primary care this morning. Continue to follow.

## 2012-10-25 NOTE — Assessment & Plan Note (Addendum)
Uncontrolled. Blood pressure remains above goal, but HR looks good.  Will continue current dose of toprol and plan to increase amlodipine from 5mg  to 10mg .  Follow up in 1 month.

## 2012-10-25 NOTE — Addendum Note (Signed)
Addended by: Earnstine Regal on: 10/25/2012 09:08 AM   Modules accepted: Orders

## 2012-10-25 NOTE — Progress Notes (Signed)
  Subjective:    Patient ID: Marie Malone, female    DOB: 1951-01-31, 62 y.o.   MRN: [id number]  HPI  Marie Malone is a 62 yr old female who presents today for follow up.  HTN- Last visit due to toprol down to 25 mg once daily and amlodipine was added.  Tolerating this regimen. Denies CP/sob or swelling.   Review of Systems See HPI  Past Medical History  Diagnosis Date  . Hypertension   . Stroke   . Rheumatic fever   . History of rheumatic fever     as a child  . Hyperlipidemia   . Heart murmur   . OSA (obstructive sleep apnea)     mild per sleep study 2008    History   Social History  . Marital Status: Married    Spouse Name: N/A    Number of Children: N/A  . Years of Education: N/A   Occupational History  . Not on file.   Social History Main Topics  . Smoking status: Never Smoker   . Smokeless tobacco: Not on file  . Alcohol Use: No  . Drug Use: No  . Sexually Active: Not on file   Other Topics Concern  . Not on file   Social History Narrative   Retired Archivist   Married   She has 2 grown children-   Daughter lives with her   Son lives in [place]   6 grandchildren    Past Surgical History  Procedure Laterality Date  . Cardiac surgery  2008    "balloon surgery at [facility]"  . Appendectomy  1989    Family History  Problem Relation Age of Onset  . Diabetes Mother   . Stroke Mother   . Diabetes Father   . Heart disease Father   . Cancer Father     kidney    No Known Allergies  Current Outpatient Prescriptions on File Prior to Visit  Medication Sig Dispense Refill  . amLODipine (NORVASC) 5 MG tablet Take 1 tablet (5 mg total) by mouth daily.  30 tablet  0  . aspirin 325 MG EC tablet Take 325 mg by mouth daily.      . metoprolol succinate (TOPROL-XL) 50 MG 24 hr tablet Take 25 mg by mouth daily. Take with or immediately following a meal.      . selenium sulfide (SELSUN) 2.5 % shampoo Massage daily into damp skin for seven  days. Leave on for 10 minutes then rinse.  118 mL  0   No current facility-administered medications on file prior to visit.    BP 158/80  Pulse 70  Temp(Src) 98.4 F (36.9 C) (Oral)  Wt 182 lb 12.8 oz (82.918 kg)  BMI 32.39 kg/m2  SpO2 93%       Objective:   Physical Exam  Constitutional: She is oriented to person, place, and time. She appears well-developed and well-nourished. No distress.  HENT:  Head: Normocephalic and atraumatic.  Cardiovascular: Normal rate and regular rhythm.   No murmur heard. Pulmonary/Chest: Effort normal and breath sounds normal. No respiratory distress. She has no wheezes. She has no rales. She exhibits no tenderness.  Neurological: She is alert and oriented to person, place, and time.  Psychiatric: She has a normal mood and affect. Her behavior is normal. Judgment and thought content normal.          Assessment & Plan:

## 2012-10-25 NOTE — Assessment & Plan Note (Signed)
Symptoms are atypical. Recent nuclear study showed no ischemia. No plans for further ischemia evaluation.

## 2012-10-25 NOTE — Patient Instructions (Addendum)
Your physician wants you to follow-up in: ONE YEAR WITH DR CRENSHAW You will receive a reminder letter in the mail two months in advance. If you don't receive a letter, please call our office to schedule the follow-up appointment.  

## 2013-01-05 ENCOUNTER — Other Ambulatory Visit: Payer: Self-pay | Admitting: Family

## 2013-01-05 NOTE — Telephone Encounter (Signed)
Received message from pt. She states she is taking 25mg  daily and did not mean to request the refill of the 50mg . Request discarded. Pt is due for follow up of her blood pressure. Please call pt to arrange appt.

## 2013-01-05 NOTE — Telephone Encounter (Signed)
Received metoprolol request form pharmacy for 50mg  daily. Current med list states 25mg  daily. Left message for pt to return my call with verification of strength at home. Pt also needs to schedule follow up as she was advised 1 month follow up from office visit in June due to medication adjustment.

## 2013-01-09 NOTE — Telephone Encounter (Signed)
Left detailed message informing patient to call our office to schedule an appointment for her blood pressure.

## 2013-02-06 ENCOUNTER — Encounter: Payer: Self-pay | Admitting: Family

## 2013-02-06 ENCOUNTER — Ambulatory Visit (INDEPENDENT_AMBULATORY_CARE_PROVIDER_SITE_OTHER): Payer: BC Managed Care – PPO | Admitting: Family

## 2013-02-06 VITALS — BP 144/78 | HR 89 | Temp 98.9°F | Resp 18 | Ht 63.0 in | Wt 193.1 lb

## 2013-02-06 DIAGNOSIS — J209 Acute bronchitis, unspecified: Secondary | ICD-10-CM

## 2013-02-06 MED ORDER — AZITHROMYCIN 250 MG PO TABS: ORAL_TABLET | ORAL | Status: DC

## 2013-02-06 MED ORDER — BENZONATATE 100 MG PO CAPS: 100.0000 mg | ORAL_CAPSULE | Freq: Three times a day (TID) | ORAL | Status: DC | PRN

## 2013-02-06 NOTE — Progress Notes (Signed)
Subjective:    Patient ID: Marie Malone, female    DOB: [birth year], 62 y.o.   MRN: [id number]  HPI Marie Malone is a 62 year old female who presents to with a chief complaint of cough.  Patient reports cough is productive with yellow green sputum and has been present for 2 weeks. Patient denies fever, reports right upper chest tightness, reports coughing and sputum production is worse in the AM. Patient has taken OTC cough suppressant and nasal decongestant without relief from cough.     Review of Systems  Constitutional: Negative for fever and chills.  HENT: Positive for congestion. Negative for ear pain, sore throat, postnasal drip and sinus pressure.        Mild nasal congestion in AM.  Eyes: Negative for redness and itching.  Respiratory: Positive for cough and chest tightness.        Chest tightness intermittently with cough.  Cough- See HPI  Cardiovascular: Negative for chest pain.  Neurological: Negative for headaches.  Hematological: Negative for adenopathy.   Past Medical History  Diagnosis Date  . Hypertension   . Stroke   . History of rheumatic fever     as a child  . Hyperlipidemia   . Mitral stenosis   . OSA (obstructive sleep apnea)     mild per sleep study 2008    History   Social History  . Marital Status: Married    Spouse Name: N/A    Number of Children: 2  . Years of Education: N/A   Occupational History  . Not on file.   Social History Main Topics  . Smoking status: Never Smoker   . Smokeless tobacco: Not on file  . Alcohol Use: No  . Drug Use: No  . Sexual Activity: Not on file   Other Topics Concern  . Not on file   Social History Narrative   Retired Archivist   Married   She has 2 grown children-   Daughter lives with her   Son lives in [place]   6 grandchildren    Past Surgical History  Procedure Laterality Date  . Cardiac surgery  2008    "balloon surgery at [facility]"  . Appendectomy  1989    Family History   Problem Relation Age of Onset  . Diabetes Mother   . Stroke Mother   . Diabetes Father   . Heart disease Father     CHF  . Cancer Father     kidney    No Known Allergies  Current Outpatient Prescriptions on File Prior to Visit  Medication Sig Dispense Refill  . amLODipine (NORVASC) 10 MG tablet Take 1 tablet (10 mg total) by mouth daily.  30 tablet  5  . aspirin 325 MG EC tablet Take 325 mg by mouth daily.      . metoprolol succinate (TOPROL-XL) 25 MG 24 hr tablet Take 1 tablet (25 mg total) by mouth daily.  30 tablet  5   No current facility-administered medications on file prior to visit.    BP 144/78  Pulse 89  Temp(Src) 98.9 F (37.2 C) (Oral)  Resp 18  Ht 5\' 3"  (1.6 m)  Wt 193 lb 1.9 oz (87.599 kg)  BMI 34.22 kg/m2  SpO2 99%        Objective:   Physical Exam  Vitals reviewed. Constitutional: She is oriented to person, place, and time. She appears well-nourished.  HENT:  Right Ear: External ear normal.  Left Ear:  External ear normal.  Nose: Nose normal.  Mouth/Throat: Oropharynx is clear and moist. No oropharyngeal exudate.  Eyes: Pupils are equal, round, and reactive to light.  Neck: Neck supple.  Cardiovascular: Normal rate, regular rhythm and normal heart sounds.   No murmur heard. Pulmonary/Chest: Breath sounds normal. No respiratory distress. She has no wheezes. She has no rales. She exhibits no tenderness.  Lymphadenopathy:    She has no cervical adenopathy.  Neurological: She is alert and oriented to person, place, and time.  Skin: Skin is warm and dry.  Psychiatric: Her behavior is normal.          Assessment & Plan:

## 2013-02-06 NOTE — Patient Instructions (Addendum)
Start zithromax.   You may use tessalon as needed for cough.  Please call if symptoms worsen, if you develop fever, or if you are not feeling better in 2-3 days.

## 2013-03-15 ENCOUNTER — Other Ambulatory Visit: Payer: Self-pay

## 2013-04-02 ENCOUNTER — Encounter: Payer: Self-pay | Admitting: Family

## 2013-04-02 ENCOUNTER — Ambulatory Visit (INDEPENDENT_AMBULATORY_CARE_PROVIDER_SITE_OTHER): Payer: BC Managed Care – PPO | Admitting: Family

## 2013-04-02 VITALS — BP 146/80 | HR 71 | Temp 98.6°F | Resp 16 | Ht 63.0 in | Wt 197.0 lb

## 2013-04-02 DIAGNOSIS — N611 Abscess of the breast and nipple: Secondary | ICD-10-CM

## 2013-04-02 DIAGNOSIS — N61 Mastitis without abscess: Secondary | ICD-10-CM

## 2013-04-02 DIAGNOSIS — Z23 Encounter for immunization: Secondary | ICD-10-CM

## 2013-04-02 MED ORDER — DOXYCYCLINE HYCLATE 100 MG PO TABS: 100.0000 mg | ORAL_TABLET | Freq: Two times a day (BID) | ORAL | Status: DC

## 2013-04-02 NOTE — Progress Notes (Signed)
  Subjective:    Patient ID: Marie Malone, female    DOB: 07-04-1950, 62 y.o.   MRN: [id number]  HPI  [name] is a 62 yr old female who presents today with chief complaint of boil.  Boil has been present x 1 week and has started to drain. Reports associated pain. Denies associated fever.    Review of Systems    see HPI  Past Medical History  Diagnosis Date  . Hypertension   . Stroke   . History of rheumatic fever     as a child  . Hyperlipidemia   . Mitral stenosis   . OSA (obstructive sleep apnea)     mild per sleep study 2008    History   Social History  . Marital Status: Married    Spouse Name: N/A    Number of Children: 2  . Years of Education: N/A   Occupational History  . Not on file.   Social History Main Topics  . Smoking status: Never Smoker   . Smokeless tobacco: Not on file  . Alcohol Use: No  . Drug Use: No  . Sexual Activity: Not on file   Other Topics Concern  . Not on file   Social History Narrative   Retired Archivist   Married   She has 2 grown children-   Daughter lives with her   Son lives in [place]   6 grandchildren    Past Surgical History  Procedure Laterality Date  . Cardiac surgery  2008    "balloon surgery at [facility]"  . Appendectomy  1989    Family History  Problem Relation Age of Onset  . Diabetes Mother   . Stroke Mother   . Diabetes Father   . Heart disease Father     CHF  . Cancer Father     kidney    No Known Allergies  Current Outpatient Prescriptions on File Prior to Visit  Medication Sig Dispense Refill  . amLODipine (NORVASC) 10 MG tablet Take 1 tablet (10 mg total) by mouth daily.  30 tablet  5  . aspirin 325 MG EC tablet Take 325 mg by mouth daily.      . metoprolol succinate (TOPROL-XL) 25 MG 24 hr tablet Take 1 tablet (25 mg total) by mouth daily.  30 tablet  5   No current facility-administered medications on file prior to visit.    BP 146/80  Pulse 71  Temp(Src) 98.6 F (37  C) (Oral)  Resp 16  Ht 5\' 3"  (1.6 m)  Wt 197 lb 0.6 oz (89.377 kg)  BMI 34.91 kg/m2  SpO2 96%    Objective:   Physical Exam  Constitutional: She appears well-developed and well-nourished. No distress.  Skin: Skin is warm and dry.  + ulcerated lesion noted beneath the left lateral breast with serosanguinous drainage.  Lesion is tender to the touch with surrounding induration.  No fluctuance  Psychiatric: She has a normal mood and affect. Her behavior is normal. Judgment and thought content normal.          Assessment & Plan:

## 2013-04-02 NOTE — Patient Instructions (Signed)
Start doxycycline. Call if you develop increased pain, fever, swelling or if not improved in 2-3 days. Follow up in 1 week.

## 2013-04-02 NOTE — Progress Notes (Signed)
Pre visit review using our clinic review tool, if applicable. No additional management support is needed unless otherwise documented below in the visit note. 

## 2013-04-03 DIAGNOSIS — N611 Abscess of the breast and nipple: Secondary | ICD-10-CM | POA: Insufficient documentation

## 2013-04-03 NOTE — Assessment & Plan Note (Signed)
rx with doxy. Dressing was changed.  Plan follow up in 1 week.

## 2013-04-06 ENCOUNTER — Inpatient Hospital Stay (HOSPITAL_BASED_OUTPATIENT_CLINIC_OR_DEPARTMENT_OTHER)
Admission: EM | Admit: 2013-04-06 | Discharge: 2013-04-10 | DRG: 040 | Disposition: A | Discharge status: Home / Self Care | Payer: BC Managed Care – PPO | Attending: Family Medicine | Admitting: Family Medicine

## 2013-04-06 ENCOUNTER — Emergency Department (HOSPITAL_BASED_OUTPATIENT_CLINIC_OR_DEPARTMENT_OTHER): Payer: BC Managed Care – PPO

## 2013-04-06 ENCOUNTER — Inpatient Hospital Stay (HOSPITAL_COMMUNITY): Payer: BC Managed Care – PPO

## 2013-04-06 ENCOUNTER — Encounter (HOSPITAL_BASED_OUTPATIENT_CLINIC_OR_DEPARTMENT_OTHER): Payer: Self-pay | Admitting: Emergency Medicine

## 2013-04-06 DIAGNOSIS — Z7982 Long term (current) use of aspirin: Secondary | ICD-10-CM

## 2013-04-06 DIAGNOSIS — Z9119 Patient's noncompliance with other medical treatment and regimen: Secondary | ICD-10-CM

## 2013-04-06 DIAGNOSIS — Z833 Family history of diabetes mellitus: Secondary | ICD-10-CM

## 2013-04-06 DIAGNOSIS — I10 Essential (primary) hypertension: Secondary | ICD-10-CM | POA: Diagnosis present

## 2013-04-06 DIAGNOSIS — M6281 Muscle weakness (generalized): Secondary | ICD-10-CM

## 2013-04-06 DIAGNOSIS — Z8249 Family history of ischemic heart disease and other diseases of the circulatory system: Secondary | ICD-10-CM

## 2013-04-06 DIAGNOSIS — Z79899 Other long term (current) drug therapy: Secondary | ICD-10-CM

## 2013-04-06 DIAGNOSIS — I2789 Other specified pulmonary heart diseases: Secondary | ICD-10-CM | POA: Diagnosis present

## 2013-04-06 DIAGNOSIS — Q211 Atrial septal defect: Secondary | ICD-10-CM

## 2013-04-06 DIAGNOSIS — E785 Hyperlipidemia, unspecified: Secondary | ICD-10-CM | POA: Diagnosis present

## 2013-04-06 DIAGNOSIS — E876 Hypokalemia: Secondary | ICD-10-CM | POA: Diagnosis present

## 2013-04-06 DIAGNOSIS — G4733 Obstructive sleep apnea (adult) (pediatric): Secondary | ICD-10-CM | POA: Diagnosis present

## 2013-04-06 DIAGNOSIS — Z6836 Body mass index (BMI) 36.0-36.9, adult: Secondary | ICD-10-CM

## 2013-04-06 DIAGNOSIS — I6350 Cerebral infarction due to unspecified occlusion or stenosis of unspecified cerebral artery: Principal | ICD-10-CM | POA: Diagnosis present

## 2013-04-06 DIAGNOSIS — I272 Pulmonary hypertension, unspecified: Secondary | ICD-10-CM

## 2013-04-06 DIAGNOSIS — E669 Obesity, unspecified: Secondary | ICD-10-CM | POA: Diagnosis present

## 2013-04-06 DIAGNOSIS — R4701 Aphasia: Secondary | ICD-10-CM

## 2013-04-06 DIAGNOSIS — Z8679 Personal history of other diseases of the circulatory system: Secondary | ICD-10-CM | POA: Diagnosis present

## 2013-04-06 DIAGNOSIS — I639 Cerebral infarction, unspecified: Secondary | ICD-10-CM

## 2013-04-06 DIAGNOSIS — I6529 Occlusion and stenosis of unspecified carotid artery: Secondary | ICD-10-CM | POA: Diagnosis present

## 2013-04-06 DIAGNOSIS — Z8673 Personal history of transient ischemic attack (TIA), and cerebral infarction without residual deficits: Secondary | ICD-10-CM

## 2013-04-06 DIAGNOSIS — Z91199 Patient's noncompliance with other medical treatment and regimen due to unspecified reason: Secondary | ICD-10-CM

## 2013-04-06 DIAGNOSIS — G936 Cerebral edema: Secondary | ICD-10-CM | POA: Diagnosis present

## 2013-04-06 DIAGNOSIS — I050 Rheumatic mitral stenosis: Secondary | ICD-10-CM | POA: Diagnosis present

## 2013-04-06 DIAGNOSIS — G8190 Hemiplegia, unspecified affecting unspecified side: Secondary | ICD-10-CM | POA: Diagnosis present

## 2013-04-06 DIAGNOSIS — Z823 Family history of stroke: Secondary | ICD-10-CM

## 2013-04-06 DIAGNOSIS — Z9889 Other specified postprocedural states: Secondary | ICD-10-CM

## 2013-04-06 DIAGNOSIS — Q2111 Secundum atrial septal defect: Secondary | ICD-10-CM

## 2013-04-06 HISTORY — DX: Other complications of anesthesia, initial encounter: T88.59XA

## 2013-04-06 HISTORY — DX: Pneumonia, unspecified organism: J18.9

## 2013-04-06 HISTORY — DX: Adverse effect of unspecified anesthetic, initial encounter: T41.45XA

## 2013-04-06 LAB — URINALYSIS, ROUTINE W REFLEX MICROSCOPIC
Glucose, UA: NEGATIVE mg/dL
Hgb urine dipstick: NEGATIVE
Leukocytes, UA: NEGATIVE
Protein, ur: NEGATIVE mg/dL
Specific Gravity, Urine: 1.013 (ref 1.005–1.030)
pH: 6.5 (ref 5.0–8.0)

## 2013-04-06 LAB — CBC
Hemoglobin: 12.5 g/dL (ref 12.0–15.0)
Hemoglobin: 13 g/dL (ref 12.0–15.0)
MCH: 27.4 pg (ref 26.0–34.0)
MCH: 27.6 pg (ref 26.0–34.0)
MCHC: 31.9 g/dL (ref 30.0–36.0)
MCHC: 32.7 g/dL (ref 30.0–36.0)
MCV: 85.8 fL (ref 78.0–100.0)
Platelets: 182 10*3/uL (ref 150–400)
RBC: 4.57 MIL/uL (ref 3.87–5.11)
RBC: 4.71 MIL/uL (ref 3.87–5.11)

## 2013-04-06 LAB — DIFFERENTIAL
Basophils Relative: 1 % (ref 0–1)
Eosinophils Relative: 3 % (ref 0–5)
Lymphs Abs: 1.5 10*3/uL (ref 0.7–4.0)
Monocytes Absolute: 0.4 10*3/uL (ref 0.1–1.0)
Myelocytes: 1 %
Neutro Abs: 2.8 10*3/uL (ref 1.7–7.7)

## 2013-04-06 LAB — COMPREHENSIVE METABOLIC PANEL
ALT: 23 U/L (ref 0–35)
Alkaline Phosphatase: 98 U/L (ref 39–117)
BUN: 18 mg/dL (ref 6–23)
CO2: 24 mEq/L (ref 19–32)
GFR calc Af Amer: >90 mL/min (ref >90)
GFR calc non Af Amer: >90 mL/min (ref >90)
Glucose, Bld: 105 mg/dL — ABNORMAL HIGH (ref 70–99)
Potassium: 3.2 mEq/L — ABNORMAL LOW (ref 3.5–5.1)
Sodium: 140 mEq/L (ref 135–145)
Total Bilirubin: 0.8 mg/dL (ref 0.3–1.2)

## 2013-04-06 LAB — MRSA PCR SCREENING: MRSA by PCR: NEGATIVE

## 2013-04-06 LAB — RAPID URINE DRUG SCREEN, HOSP PERFORMED
Cocaine: NOT DETECTED
Opiates: NOT DETECTED

## 2013-04-06 LAB — TROPONIN I: Troponin I: <0.3 ng/mL (ref <0.30)

## 2013-04-06 LAB — CREATININE, SERUM: Creatinine, Ser: 0.59 mg/dL (ref 0.50–1.10)

## 2013-04-06 LAB — PROTIME-INR: Prothrombin Time: 14.3 seconds (ref 11.6–15.2)

## 2013-04-06 LAB — GLUCOSE, CAPILLARY: Glucose-Capillary: 94 mg/dL (ref 70–99)

## 2013-04-06 MED ORDER — HEPARIN SODIUM (PORCINE) 5000 UNIT/ML IJ SOLN
5000.0000 [IU] | Freq: Three times a day (TID) | INTRAMUSCULAR | Status: DC
  Administered 2013-04-07 – 2013-04-10 (×9): 5000 [IU] via SUBCUTANEOUS
  Filled 2013-04-06 (×15): qty 1

## 2013-04-06 MED ORDER — ASPIRIN EC 325 MG PO TBEC
325.0000 mg | DELAYED_RELEASE_TABLET | Freq: Every day | ORAL | Status: DC
  Filled 2013-04-06: qty 1

## 2013-04-06 MED ORDER — DOXYCYCLINE HYCLATE 100 MG PO TABS
100.0000 mg | ORAL_TABLET | Freq: Two times a day (BID) | ORAL | Status: DC
  Administered 2013-04-06 – 2013-04-10 (×8): 100 mg via ORAL
  Filled 2013-04-06 (×10): qty 1

## 2013-04-06 MED ORDER — SODIUM CHLORIDE 0.9 % IV BOLUS (SEPSIS)
1000.0000 mL | Freq: Once | INTRAVENOUS | Status: AC
  Administered 2013-04-06: 1000 mL via INTRAVENOUS

## 2013-04-06 NOTE — ED Provider Notes (Signed)
CSN: [id number]     Arrival date & time 04/06/13  1259 History   First MD Initiated Contact with Patient 04/06/13 1307     Chief Complaint  Patient presents with  . Extremity Weakness  . Aphasia   (Consider location/radiation/quality/duration/timing/severity/associated sxs/prior Treatment) Patient is a 62 y.o. female presenting with extremity weakness.  Extremity Weakness Associated symptoms include weakness. Pertinent negatives include no fever, headaches or numbness.    62 year old female with right-sided upper extremity weakness, dysarthria, and slurred speech after a fall last night. He states that last night she is under the bathroom and fell to her knees and then all the way to the ground. She denies loss of consciousness or hitting her head. She states that she did not trip and was not dizzy but simply fell. When she got up she felt that her right arm felt "funny" in week. Today she's had weakness in her right hand dropping things that she was turned to carry. Her daughter is at bedside and states that her speech is slurred, lower than usual, and much lower than usual. The patient denies headache.   Past Medical History  Diagnosis Date  . Hypertension   . Stroke   . History of rheumatic fever     as a child  . Hyperlipidemia   . Mitral stenosis   . OSA (obstructive sleep apnea)     mild per sleep study 2008   Past Surgical History  Procedure Laterality Date  . Cardiac surgery  2008    "balloon surgery at [facility]"  . Appendectomy  1989   Family History  Problem Relation Age of Onset  . Diabetes Mother   . Stroke Mother   . Diabetes Father   . Heart disease Father     CHF  . Cancer Father     kidney   History  Substance Use Topics  . Smoking status: Never Smoker   . Smokeless tobacco: Not on file  . Alcohol Use: No   OB History   Grav Para Term Preterm Abortions TAB SAB Ect Mult Living                 Review of Systems  Constitutional: Negative for fever.   Musculoskeletal: Positive for extremity weakness.  Neurological: Positive for facial asymmetry, speech difficulty and weakness. Negative for dizziness, syncope, light-headedness, numbness and headaches.  All other systems reviewed and are negative.    Allergies  Review of patient's allergies indicates no known allergies.  Home Medications   Current Outpatient Rx  Name  Route  Sig  Dispense  Refill  . amLODipine (NORVASC) 10 MG tablet   Oral   Take 1 tablet (10 mg total) by mouth daily.   30 tablet   5   . aspirin 325 MG EC tablet   Oral   Take 325 mg by mouth daily.         [name] Kitchen doxycycline (VIBRA-TABS) 100 MG tablet   Oral   Take 1 tablet (100 mg total) by mouth 2 (two) times daily.   20 tablet   0   . metoprolol succinate (TOPROL-XL) 25 MG 24 hr tablet   Oral   Take 1 tablet (25 mg total) by mouth daily.   30 tablet   5    Pulse 80  Temp(Src) 98.2 F (36.8 C) (Oral)  Resp 16  Ht 5\' 2"  (1.575 m)  Wt 197 lb (89.359 kg)  BMI 36.02 kg/m2  SpO2 97% Physical Exam  Nursing  note and vitals reviewed. Constitutional: She is oriented to person, place, and time. She appears well-developed and well-nourished. No distress.  HENT:  Head: Normocephalic and atraumatic.  Eyes: EOM are normal. Pupils are equal, round, and reactive to light.  Neck: Neck supple.  Cardiovascular: Normal rate, regular rhythm and normal heart sounds.   No murmur heard. Pulmonary/Chest: Effort normal and breath sounds normal.  Abdominal: Soft. Bowel sounds are normal. There is no tenderness. There is no guarding.  Musculoskeletal: She exhibits no edema.  Neurological: She is alert and oriented to person, place, and time. She has normal strength. She displays no atrophy. A cranial nerve deficit is present. No sensory deficit. She exhibits normal muscle tone.  R toungue deviation, CN 2-12 otherwise intact R labial fold droop. Normal smile Strength 5/5 and sensation intact in all 4 extremities.   Impaired finger to nose on the R>L  Skin: Skin is warm and dry. She is not diaphoretic.  Psychiatric: She has a normal mood and affect.    ED Course  Procedures (including critical care time) Labs Review Labs Reviewed  GLUCOSE, CAPILLARY  ETHANOL  PROTIME-INR  APTT  CBC  DIFFERENTIAL  COMPREHENSIVE METABOLIC PANEL  URINE RAPID DRUG SCREEN (HOSP PERFORMED)  URINALYSIS, ROUTINE W REFLEX MICROSCOPIC  TROPONIN I   Imaging Review No results found.  EKG Interpretation   None       MDM   1. Stroke    62 year old female here with subjective right arm weakness and objective dysarthria starting last night around 11 PM. On exam she is hemodynamically stable and in no acute distress. Her daughter at bedside states that her speech is much different than baseline being louder than usual, slurred, and slower than usual. On exam she has right tongue deviation and cerebellar findings most pronounced on the R.   CT the head shows evolving infarct in the L parietal lobe in the distribution of the L MCA without hemorrhage or mass effect.    I have discussed this case with neurology and feels she needs an MRI admission for workup. I have discussed teh case with the hospitalist who accepts the patient for transfer.    Timmothy Euler, MD 04/06/13 [id number]

## 2013-04-06 NOTE — Progress Notes (Signed)
MRI completed, pt returned to unit, settled comfortable in bed. Pt remained oriented with slurred speech, rt side weakness and decrease sensation tort face,  rt arm and rt leg.  Daughter at bedside and is presently updating family members.B/P 150/56  HR 74 RR 15 02 Sat 97 R/A. We will continue to monitor.

## 2013-04-06 NOTE — H&P (Signed)
[facility] History and Physical  Estefanny Rynkiewicz Zingg [id number] DOB: 1950-12-05 DOA: 04/06/2013  Referring physician: Vanita Panda Ed-P PCP: [name], NP  Specialists: Neurology  Chief Complaint: Stroke  HPI: Marie Malone is a 62 y.o. female, known history of hypertension, prior stroke 2004 [facility], rheumatic fever as a child resulting in valvuloplasty [facility] 2008, mild obstructive sleep apnea per sleep study 2008 with severe pulmonary hypertension, moderate mitral stenosis [valve area 1.1 centimeters squared], low-risk nuclear study 08/27/12, who was last seen normal about 10 PM last night 11/27. She awoke in the morning of 11/28 and noticed that her right arm was weak and had a fall in the bathroom. Her husband who lives with her home had helped her back to bed she woke up again at 8 AM and had significant deficits again and slurred speech. He does this she called her daughter who advised her to seek immediate medical attention. She [facility] and was referred over to [facility] for workup-there was a question of fluctuating mental signs and stability and therefore she was accepted to step down unit. CT scan of the head showed left MCA artery parietal lobe infarct with no hemorrhage-patient presented outside TPA window initial NIHSS was 7 on admission   Review of Systems:  No chest pain no nausea no vomiting no shortness of breath Recently treated for oil with oral antibiotics No diarrhea no constipation no dark stool no tarry stool no falls other than this morning,. Next line  Past Medical History  Diagnosis Date  . Hypertension   . Stroke   . History of rheumatic fever     as a child  . Hyperlipidemia   . Mitral stenosis   . OSA (obstructive sleep apnea)     mild per sleep study 2008   Past Surgical History  Procedure Laterality Date  . Cardiac surgery  2008    "balloon surgery at [facility]"  . Appendectomy  1989    Social History:  reports that she has never smoked. She does not have any smokeless tobacco history on file. She reports that she does not drink alcohol or use illicit drugs. Lives at home with her husband who has significant aneurysms  No Known Allergies  Family History  Problem Relation Age of Onset  . Diabetes Mother   . Stroke Mother   . Diabetes Father   . Heart disease Father     CHF  . Cancer Father     kidney    Prior to Admission medications   Medication Sig Start Date End Date Taking? Authorizing Provider  amLODipine (NORVASC) 10 MG tablet Take 1 tablet (10 mg total) by mouth daily. 10/25/12   Debbrah Alar, NP  aspirin 325 MG EC tablet Take 325 mg by mouth daily.    Historical Provider, MD  doxycycline (VIBRA-TABS) 100 MG tablet Take 1 tablet (100 mg total) by mouth 2 (two) times daily. 04/02/13   Debbrah Alar, NP  metoprolol succinate (TOPROL-XL) 25 MG 24 hr tablet Take 1 tablet (25 mg total) by mouth daily. 10/25/12   Debbrah Alar, NP   Physical Exam: Filed Vitals:   04/06/13 1515  BP: 148/68  Pulse:   Temp:   Resp: 21     General:  Alert pleasant oriented x3  Eyes: Pupils 3 mm, no deviation, vision by direct confrontation is normal  ENT: Mild deviation of the face on the right, uvula is midline  Neck: Soft supple,  no JVD no thyromegaly  Cardiovascular: S1-S2 no murmur rub or gallop  Respiratory: Clinically clear  Abdomen: Soft nontender nondistended  Skin: No lower extremity edema  Musculoskeletal: Range of motion intact good power bilaterally  Psychiatric: Euthymic  Neurologic: Patient exhibits a little bit of confusion and does not follow commands completely although understands eventually when repeated he she has 4/5 power in right upper extremity flexor extensor sensory is decreased in the right side of the body entirely the cool touch. Power is 5 out 5 in both lower extremities and hips knees and ankles. Finger-nose-finger  test shows past-pointing on left side as well as on right side and she has dysdiadochokinesia more so on right and left she does not have any dysnomia or dysarthria currently  Labs on Admission:  Basic Metabolic Panel:  Recent Labs Lab 04/06/13 1325  NA 140  K 3.2*  CL 102  CO2 24  GLUCOSE 105*  BUN 18  CREATININE 0.70  CALCIUM 9.6   Liver Function Tests:  Recent Labs Lab 04/06/13 1325  AST 26  ALT 23  ALKPHOS 98  BILITOT 0.8  PROT 9.5*  ALBUMIN 4.2   No results found for this basename: LIPASE, AMYLASE,  in the last 168 hours No results found for this basename: AMMONIA,  in the last 168 hours CBC:  Recent Labs Lab 04/06/13 1325  WBC 4.8  NEUTROABS 2.8  HGB 12.5  HCT 39.2  MCV 85.8  PLT 182   Cardiac Enzymes:  Recent Labs Lab 04/06/13 1325  TROPONINI <0.30    BNP (last 3 results) No results found for this basename: PROBNP,  in the last 8760 hours CBG:  Recent Labs Lab 04/06/13 1323  GLUCAP 94    Radiological Exams on Admission: Ct Head Wo Contrast  04/06/2013   CLINICAL DATA:  Right-sided weakness and slurred speech  EXAM: CT HEAD WITHOUT CONTRAST  TECHNIQUE: Contiguous axial images were obtained from the base of the skull through the vertex without intravenous contrast.  COMPARISON:  09/25/2006  FINDINGS: The bony calvarium is intact. No gross soft tissue abnormality is noted. There is a geographic area identified in the left parietal lobe which roughly measures 3.9 x 3.4 cm in greatest dimension consistent with an evolving infarct. No significant mass effect is noted. No significant midline shift is seen. No acute hemorrhage or space-occupying mass lesion is noted.  IMPRESSION: Evolving infarct in the distribution of the left middle cerebral artery in the left parietal lobe. No associated hemorrhage is noted. No other focal abnormality is noted.  These results were called by telephone at the time of interpretation on 04/06/2013 at 1:58 PM to Dr.  Wendi Snipes , who verbally acknowledged these results.   Electronically Signed   By: Inez Catalina M.D.   On: 04/06/2013 14:00    EKG: Independently reviewed. Sinus rhythm first degree AV block  Assessment/Plan Principal Problem:   Stroke-left MCA distribution clinically-on stroke pathway.  Admitted to step down secondary to reported initial fluctuating mental status, symptoms and signs. Vital signs every 2 hourly initially, NIHSS now and q12, close step down monitoring-needs risk factor modification and possibly high-intensity statin--okay to travel off telemetry Active Problems:   HTN (hypertension)-allow permissive hypertension over 24-48 hours, reimplement amlodipine 10, metoprolol 25 XL on 11/29    History of rheumatic fever-currently stable    Mitral stenosis-stable   S/P aortic valvotomy 2008-stable   Moderate to severe pulmonary hypertension-patient will get echocardiogram for stroke workup. Monitor.  Possible independent risk  factor for stroke?   Obstructive sleep apnea-mild-patient snores at night, trial CPAP machine per her    Time spent: [place] Pager [phone]  If 7PM-7AM, please contact night-coverage [email or web address] Password [facility] 04/06/2013, 4:51 PM

## 2013-04-06 NOTE — ED Provider Notes (Signed)
The patient was seen in conjunction with the Resident Physician, Dr. Wendi Snipes.  The documentation is an accurate reflection of the patient encounter.  On my exam, the patient had dysarthria and expressive aphasia, very inconsistent speech patterns, and according to her daughter was acting in an unusual fashion.   She also had gross cerebellar deficits.  However, she was smiling, agreeable, and her airway was patent throughout.   With the passage of one day since the onset of symptoms, she was not a candidate for TPA.  However, her CT head evidence of evolving left stroke.  On subsequent repeat evaluation after the nursing staff the patient was becoming less responsive, patient again had a patent airway, was talkative.  I reviewed the CT images with the resident physician (and agree with the interpretation while is clearly visible), discussed all findings with the patient, her daughter, her sister.  Subsequently we discussed her care with our neurology team, our hospitalists, and the patient required transfer to the step down unit given the acute stroke.  I evaluated the EKG, which showed sinus rhythm, rate 65, nonspecific changes, and was abnormal.     CRITICAL CARE Performed by: Carmin Muskrat Total critical care time: 35 Critical care time was exclusive of separately billable procedures and treating other patients. Critical care was necessary to treat or prevent imminent or life-threatening deterioration. Critical care was time spent personally by me on the following activities: development of treatment plan with patient and/or surrogate as well as nursing, discussions with consultants, evaluation of patient's response to treatment, examination of patient, obtaining history from patient or surrogate, ordering and performing treatments and interventions, ordering and review of laboratory studies, ordering and review of radiographic studies, pulse oximetry and re-evaluation of patient's  condition.   Carmin Muskrat, MD 04/06/13 [phone]

## 2013-04-06 NOTE — Consult Note (Addendum)
Neurology Consultation Reason for Consult: Right-sided weakness Referring Physician: Nada Libman  CC: Right-sided weakness  History is obtained from: Patient  HPI: Marie Malone is a 62 y.o. female with a history of stroke who got up to go to the bathroom last night then subsequently fell and since that time has noticed right-sided weakness. It has never got away completely during this time frame. He has been dropping things.  Family has noticed slurred speech as well.  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  LKW: Prior to going to bed at 11/27 tpa given?: no, outside of window NIHSS: 6   ROS: A 14 point ROS was performed and is negative except as noted in the HPI.  Past Medical History  Diagnosis Date  . Hypertension   . Stroke   . History of rheumatic fever     as a child  . Hyperlipidemia   . Mitral stenosis   . OSA (obstructive sleep apnea)     mild per sleep study 2008    Family History: Mother-possible stroke  Social History: Tob: Denies  Exam: Current vital signs: BP 145/67  Pulse 70  Temp(Src) 98.1 F (36.7 C) (Oral)  Resp 21  Ht 5\' 2"  (1.575 m)  Wt 89.9 kg (198 lb 3.1 oz)  BMI 36.24 kg/m2  SpO2 97% Vital signs in last 24 hours: Temp:  [98.1 F (36.7 C)-98.2 F (36.8 C)] 98.1 F (36.7 C) (11/28 1654) Pulse Rate:  [70-80] 70 (11/28 1852) Resp:  [16-24] 21 (11/28 1515) BP: (129-158)/(67-108) 145/67 mmHg (11/28 1852) SpO2:  [97 %-99 %] 97 % (11/28 1852) Weight:  [89.359 kg (197 lb)-89.9 kg (198 lb 3.1 oz)] 89.9 kg (198 lb 3.1 oz) (11/28 1654)  General: In bed, NAD CV: Regular rate and rhythm Mental Status: Patient is awake, alert, oriented to person, place, month, year, and situation. Immediate and remote memory are  intact. Patient is able to give a clear and coherent history. No signs of aphasia or neglect Cranial Nerves: II: Visual Fields are full. Pupils are equal, round, and reactive to light.  Discs are difficult to visualize. III,IV, VI: EOMI without ptosis or diploplia.  V: Facial sensation is decreased on right.  VII: Facial movement is weak on right.  VIII: hearing is intact to voice X: Uvula elevates symmetrically XI: Shoulder shrug is symmetric. XII: tongue deviates mildly to the right Motor: Tone is normal. Bulk is normal. 5/5 strength was present on the left, on the right she has  gross discoordination of her right arm and leg She has at least 3-4/5 strength on the right.  Sensory: Sensation is decreased on the right compared to left  Deep Tendon Reflexes: 2+ and symmetric in the biceps and patellae.  Cerebellar: FNF and HKS are consistent with weakness on the right, intact on the left Gait: Not tested secondary to patient safety concerns  I have reviewed labs in epic and the results pertinent to this consultation are: CMP-mild hypokalemia  I have reviewed the images obtained: CT head-L MCA territory infarct  Impression: Left MCA territory infarct. Patient is outside of any intervention window. She has had some worsening of her symptoms, and therefore will give fluid bolus.   Recommendations: 1. HgbA1c, fasting lipid panel 2. MRI, MRA  of the brain without contrast 3. Frequent neuro checks 4. Echocardiogram 5. Carotid dopplers 6. Prophylactic therapy-Antiplatelet med: Aspirin - dose 325mg  7. Risk factor modification 8. Telemetry monitoring 9. PT consult, OT consult, Speech consult 10 NS 1 L    Roland Rack, MD [facility] [phone]  If 7pm- 7am, please page neurology on call at [phone].

## 2013-04-06 NOTE — Progress Notes (Signed)
Rapid Response nurse accompanied to pt to MRI

## 2013-04-06 NOTE — ED Notes (Addendum)
MD at bedside, notified MD about changes in patient (right upper extremity weaker and patient is more sleepy)

## 2013-04-06 NOTE — Progress Notes (Signed)
Called into room by family.Noted rt. Facial drooping and slurred speech. Able to follow commands; alert and oriented x 3. States I cant get my arm to act right. Noted swing rt. arm in the air. Dr. Verlon Au paged and in to see. Dr. Katherine Roan in to see. Orders for stat MRI .  B/p 145/67 hr 70.

## 2013-04-06 NOTE — ED Notes (Signed)
CBG 94 

## 2013-04-06 NOTE — Progress Notes (Addendum)
62 year old lady coming in from [facility] for evaluation of stroke. She has a h/o hypertension, stroke, last seen normal 11 hours ago, brought in by her daughter for right sided weakness, slurred speech and a fall. Her CT head without contrast is pending. If it is negative for intracranial bleed, she will be transferred to [facility] , for further stroke work up.    Hosie Poisson, MD. [phone]  Patient CT head shows evolving stroke in the left middle cerebral artery. Pt to be transferred to step down unit at [facility].    Hosie Poisson, MD  [phone]

## 2013-04-06 NOTE — ED Notes (Signed)
Pt having right sided weakness, slurred speech this am.  Last known normal last night approx 11pm.  Noted right arm drift.

## 2013-04-07 DIAGNOSIS — I059 Rheumatic mitral valve disease, unspecified: Secondary | ICD-10-CM

## 2013-04-07 DIAGNOSIS — I6350 Cerebral infarction due to unspecified occlusion or stenosis of unspecified cerebral artery: Secondary | ICD-10-CM

## 2013-04-07 LAB — GLUCOSE, CAPILLARY
Glucose-Capillary: 89 mg/dL (ref 70–99)
Glucose-Capillary: 83 mg/dL (ref 70–99)
Glucose-Capillary: 91 mg/dL (ref 70–99)
Glucose-Capillary: 94 mg/dL (ref 70–99)

## 2013-04-07 LAB — LIPID PANEL
Cholesterol: 185 mg/dL (ref 0–200)
LDL Cholesterol: 114 mg/dL — ABNORMAL HIGH (ref 0–99)
Total CHOL/HDL Ratio: 3.7 RATIO
Triglycerides: 106 mg/dL (ref <150)
VLDL: 21 mg/dL (ref 0–40)

## 2013-04-07 LAB — BASIC METABOLIC PANEL
CO2: 23 mEq/L (ref 19–32)
Chloride: 101 mEq/L (ref 96–112)
Glucose, Bld: 116 mg/dL — ABNORMAL HIGH (ref 70–99)
Potassium: 3.1 mEq/L — ABNORMAL LOW (ref 3.5–5.1)
Sodium: 139 mEq/L (ref 135–145)

## 2013-04-07 MED ORDER — AMLODIPINE BESYLATE 10 MG PO TABS
10.0000 mg | ORAL_TABLET | Freq: Every day | ORAL | Status: DC
  Administered 2013-04-07 – 2013-04-10 (×4): 10 mg via ORAL
  Filled 2013-04-07 (×4): qty 1

## 2013-04-07 MED ORDER — ATORVASTATIN CALCIUM 80 MG PO TABS
80.0000 mg | ORAL_TABLET | Freq: Every day | ORAL | Status: DC
  Administered 2013-04-07 – 2013-04-09 (×3): 80 mg via ORAL
  Filled 2013-04-07 (×4): qty 1

## 2013-04-07 MED ORDER — METOPROLOL SUCCINATE ER 25 MG PO TB24
25.0000 mg | ORAL_TABLET | Freq: Every day | ORAL | Status: DC
  Administered 2013-04-07 – 2013-04-10 (×4): 25 mg via ORAL
  Filled 2013-04-07 (×5): qty 1

## 2013-04-07 MED ORDER — POTASSIUM CHLORIDE CRYS ER 20 MEQ PO TBCR
20.0000 meq | EXTENDED_RELEASE_TABLET | Freq: Two times a day (BID) | ORAL | Status: DC
  Administered 2013-04-07 – 2013-04-10 (×6): 20 meq via ORAL
  Filled 2013-04-07 (×7): qty 1

## 2013-04-07 MED ORDER — CLOPIDOGREL BISULFATE 75 MG PO TABS
75.0000 mg | ORAL_TABLET | Freq: Every day | ORAL | Status: DC
  Administered 2013-04-08 – 2013-04-10 (×3): 75 mg via ORAL
  Filled 2013-04-07 (×5): qty 1

## 2013-04-07 MED ORDER — ACETAMINOPHEN 325 MG PO TABS
650.0000 mg | ORAL_TABLET | Freq: Four times a day (QID) | ORAL | Status: DC | PRN
  Administered 2013-04-07 – 2013-04-08 (×3): 650 mg via ORAL
  Filled 2013-04-07 (×3): qty 2

## 2013-04-07 NOTE — Progress Notes (Signed)
  Echocardiogram 2D Echocardiogram has been performed.  Devesh Monforte FRANCES 04/07/2013, 3:24 PM

## 2013-04-07 NOTE — Progress Notes (Signed)
04/07/13 2243  BiPAP/CPAP/SIPAP  BiPAP/CPAP/SIPAP Pt Type Adult  Mask Type Nasal mask  Mask Size Medium  Respiratory Rate 13 breaths/min  IPAP 20 cmH20  EPAP 5 cmH2O  Oxygen Percent 21 %  Flow Rate 0 lpm  BiPAP/CPAP/SIPAP CPAP  Patient Home Equipment No  Auto Titrate Yes  Patient placed on CPAP, Auto Mode with max IPAP 20 and min EPAP 5. She tolerates it very well at this time.

## 2013-04-07 NOTE — Progress Notes (Signed)
Utilization review completed. Lataria Courser, RN, BSN. 

## 2013-04-07 NOTE — Progress Notes (Signed)
VASCULAR LAB PRELIMINARY  PRELIMINARY  PRELIMINARY  PRELIMINARY  Carotid Dopplers completed.    Preliminary report:  There is 60-79% Right ICA stenosis.  There is 1-39% left ICA stenosis.  Vertebral artery flow is antegrade.  Keryl Gholson, RVT 04/07/2013, 10:33 AM

## 2013-04-07 NOTE — Progress Notes (Signed)
Stroke Team Progress Note  HISTORY  Marie Malone is a 61 y.o. female with a history of stroke who got up to go to the bathroom last night then subsequently fell and since that time has noticed right-sided weakness. It has never gone away completely during this time frame. He has been dropping things.  Family has noticed slurred speech as well.  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  LKW: Prior to going to bed at 11/27  tpa given?: no, outside of window  NIHSS: 6  Patient was not a TPA candidate secondary to duration of symptoms.   SUBJECTIVE Her daughter is at bedside. The patient complains of a headache and some ongoing right arm weakness.  OBJECTIVE Most recent Vital Signs: Filed Vitals:   04/07/13 0200 04/07/13 0314 04/07/13 0534 04/07/13 0800  BP: 155/79  148/53 137/64  Pulse: 73 75 71 78  Temp:   98 F (36.7 C) 98.2 F (36.8 C)  TempSrc:   Oral Oral  Resp: 17 17 25 12   Height:      Weight:      SpO2: 99% 99% 98% 98%   CBG (last 3)   Recent Labs  04/06/13 1323 04/07/13 0814  GLUCAP 94 89    IV Fluid Intake:     MEDICATIONS  . amLODipine  10 mg Oral Daily  . aspirin  325 mg Oral Daily  . atorvastatin  80 mg Oral q1800  . doxycycline  100 mg Oral BID  . heparin  5,000 Units Subcutaneous Q8H  . metoprolol succinate  25 mg Oral Daily   PRN:  acetaminophen  Diet:  Cardiac thin liquids Activity:  Bathroom privileges, Up with assistance DVT Prophylaxis:  SQ heparin  CLINICALLY SIGNIFICANT STUDIES Basic Metabolic Panel:  Recent Labs Lab 04/06/13 1325 04/06/13 2033 04/07/13 0920  NA 140  --  139  K 3.2*  --  3.1*  CL 102  --  101  CO2 24  --  23  GLUCOSE 105*  --  116*  BUN 18  --  10  CREATININE 0.70 0.59 0.65  CALCIUM 9.6  --  9.2    Liver Function Tests:  Recent Labs Lab 04/06/13 1325  AST 26  ALT 23  ALKPHOS 98  BILITOT 0.8  PROT 9.5*  ALBUMIN 4.2   CBC:  Recent Labs Lab 04/06/13 1325 04/06/13 2033  WBC 4.8 5.1  NEUTROABS 2.8  --   HGB 12.5 13.0  HCT 39.2 39.7  MCV 85.8 84.3  PLT 182 156   Coagulation:  Recent Labs Lab 04/06/13 1325  LABPROT 14.3  INR 1.13   Cardiac Enzymes:  Recent Labs Lab 04/06/13 1325  TROPONINI <0.30   Urinalysis:  Recent Labs Lab 04/06/13 1520  COLORURINE YELLOW  LABSPEC 1.013  PHURINE 6.5  GLUCOSEU NEGATIVE  HGBUR NEGATIVE  BILIRUBINUR NEGATIVE  KETONESUR NEGATIVE  PROTEINUR NEGATIVE  UROBILINOGEN 1.0  NITRITE NEGATIVE  LEUKOCYTESUR NEGATIVE   Lipid Panel    Component Value Date/Time   CHOL 185 04/07/2013 0527   TRIG 106 04/07/2013 0527   HDL 50 04/07/2013 0527   CHOLHDL 3.7 04/07/2013 0527   VLDL 21 04/07/2013 0527   LDLCALC 114* 04/07/2013 0527   HgbA1C  Lab Results  Component Value Date  HGBA1C 6.3* 04/06/2013    Urine Drug Screen:     Component Value Date/Time   LABOPIA NONE DETECTED 04/06/2013 1520   COCAINSCRNUR NONE DETECTED 04/06/2013 1520   LABBENZ NONE DETECTED 04/06/2013 1520   AMPHETMU NONE DETECTED 04/06/2013 1520   THCU NONE DETECTED 04/06/2013 1520   LABBARB NONE DETECTED 04/06/2013 1520    Alcohol Level:  Recent Labs Lab 04/06/13 1325  ETH <11    Ct Head Wo Contrast  04/06/2013   CLINICAL DATA:  Right-sided weakness and slurred speech  EXAM: CT HEAD WITHOUT CONTRAST  TECHNIQUE: Contiguous axial images were obtained from the base of the skull through the vertex without intravenous contrast.  COMPARISON:  09/25/2006  FINDINGS: The bony calvarium is intact. No gross soft tissue abnormality is noted. There is a geographic area identified in the left parietal lobe which roughly measures 3.9 x 3.4 cm in greatest dimension consistent with an evolving infarct. No significant mass effect is noted. No significant midline  shift is seen. No acute hemorrhage or space-occupying mass lesion is noted.  IMPRESSION: Evolving infarct in the distribution of the left middle cerebral artery in the left parietal lobe. No associated hemorrhage is noted. No other focal abnormality is noted.  These results were called by telephone at the time of interpretation on 04/06/2013 at 1:58 PM to Dr. Wendi Snipes , who verbally acknowledged these results.   Electronically Signed   By: Inez Catalina M.D.   On: 04/06/2013 14:00   Mri Brain Without Contrast  04/06/2013   CLINICAL DATA:  62 year old female with acute onset right side weakness and slurred speech. Left MCA infarcts suspected on CT. Initial encounter. History of remote infarcts.  EXAM: MRI HEAD WITHOUT CONTRAST  MRA HEAD WITHOUT CONTRAST  TECHNIQUE: Multiplanar, multiecho pulse sequences of the brain and surrounding structures were obtained without intravenous contrast. Angiographic images of the head were obtained using MRA technique without contrast.  COMPARISON:  Head CTs 04/06/2013 and earlier.  FINDINGS: MRI HEAD FINDINGS  Moderate size confluent restricted diffusion in the posterior left MCA territory corresponding to the earlier CT finding. The area of diffusion abnormality encompasses 5-6 cm. There is T2 an less pronounced FLAIR hyperintensity an gyral edema. There is an associated conspicuous focus of increased FLAIR signal within a left posterior MCA branch on series 7, image 8. Downstream branches also show asymmetric increased FLAIR signal (images 9 and 10). MRA findings are below.  No acute intracranial hemorrhage identified. No right hemisphere or posterior fossa restricted diffusion. Other Major intracranial vascular flow voids are preserved.  Small chronic left cerebellar lacunar infarct. Small bilateral chronic basal ganglia lacunar infarcts versus perivascular spaces. There is also a small focus of cortical encephalomalacia in the right sylvian fissure on series 7, image 10.  Otherwise minimal superimposed nonspecific right hemisphere periventricular white matter T2 and FLAIR hyperintensity.  No midline shift, mass effect, evidence of mass lesion, ventriculomegaly, extra-axial collection or acute intracranial hemorrhage. Cervicomedullary junction and pituitary are within normal limits. Negative visualized cervical spine. Visualized bone marrow signal is within normal limits. Visualized orbit soft tissues are within normal limits. Minor paranasal sinus mucosal thickening. Mild left mastoid fluid. Negative nasopharynx. Visualized scalp soft tissues are within normal limits.  MRA HEAD FINDINGS  Antegrade flow in the posterior circulation. Dominant distal right vertebral artery. Normal PICA origins. Patent vertebrobasilar junction. No basilar stenosis. SCA and PCA origins are normal. Posterior communicating arteries are diminutive or absent. Bilateral PCA branches are within normal limits.  Antegrade flow  in both ICA siphons. No ICA siphon stenosis. Patent carotid termini. Normal visualized bilateral ACA branches. The anterior communicating artery is diminutive or absent. Visualized right MCA branches are within normal limits.  Normal left MCA origin. The left M1 segment is patent. The dominant left MCA posterior sylvian branch is occluded about 10 mm beyond its origin (series 5, images 69 -73). This corresponds to the level of the abnormal FLAIR signal described above. The left MCA anterior division branches appear within normal limits.  IMPRESSION: 1. Moderate-sized acute posterior left MCA infarct. Edema without significant mass effect. No associated hemorrhage at this time. 2. Underlying small areas of chronic small and medium-sized vessel ischemia. 3. Occluded left MCA posterior M2 branch about 10 mm beyond its origin. 4. Negative more proximal anterior circulation and otherwise negative intracranial MRA.   Electronically Signed   By: Lars Pinks M.D.   On: 04/06/2013 20:38   Mr Jodene Nam  Head/brain Wo Cm  04/06/2013   CLINICAL DATA:  62 year old female with acute onset right side weakness and slurred speech. Left MCA infarcts suspected on CT. Initial encounter. History of remote infarcts.  EXAM: MRI HEAD WITHOUT CONTRAST  MRA HEAD WITHOUT CONTRAST  TECHNIQUE: Multiplanar, multiecho pulse sequences of the brain and surrounding structures were obtained without intravenous contrast. Angiographic images of the head were obtained using MRA technique without contrast.  COMPARISON:  Head CTs 04/06/2013 and earlier.  FINDINGS: MRI HEAD FINDINGS  Moderate size confluent restricted diffusion in the posterior left MCA territory corresponding to the earlier CT finding. The area of diffusion abnormality encompasses 5-6 cm. There is T2 an less pronounced FLAIR hyperintensity an gyral edema. There is an associated conspicuous focus of increased FLAIR signal within a left posterior MCA branch on series 7, image 8. Downstream branches also show asymmetric increased FLAIR signal (images 9 and 10). MRA findings are below.  No acute intracranial hemorrhage identified. No right hemisphere or posterior fossa restricted diffusion. Other Major intracranial vascular flow voids are preserved.  Small chronic left cerebellar lacunar infarct. Small bilateral chronic basal ganglia lacunar infarcts versus perivascular spaces. There is also a small focus of cortical encephalomalacia in the right sylvian fissure on series 7, image 10. Otherwise minimal superimposed nonspecific right hemisphere periventricular white matter T2 and FLAIR hyperintensity.  No midline shift, mass effect, evidence of mass lesion, ventriculomegaly, extra-axial collection or acute intracranial hemorrhage. Cervicomedullary junction and pituitary are within normal limits. Negative visualized cervical spine. Visualized bone marrow signal is within normal limits. Visualized orbit soft tissues are within normal limits. Minor paranasal sinus mucosal thickening.  Mild left mastoid fluid. Negative nasopharynx. Visualized scalp soft tissues are within normal limits.  MRA HEAD FINDINGS  Antegrade flow in the posterior circulation. Dominant distal right vertebral artery. Normal PICA origins. Patent vertebrobasilar junction. No basilar stenosis. SCA and PCA origins are normal. Posterior communicating arteries are diminutive or absent. Bilateral PCA branches are within normal limits.  Antegrade flow in both ICA siphons. No ICA siphon stenosis. Patent carotid termini. Normal visualized bilateral ACA branches. The anterior communicating artery is diminutive or absent. Visualized right MCA branches are within normal limits.  Normal left MCA origin. The left M1 segment is patent. The dominant left MCA posterior sylvian branch is occluded about 10 mm beyond its origin (series 5, images 69 -73). This corresponds to the level of the abnormal FLAIR signal described above. The left MCA anterior division branches appear within normal limits.  IMPRESSION: 1. Moderate-sized acute posterior left MCA infarct. Edema  without significant mass effect. No associated hemorrhage at this time. 2. Underlying small areas of chronic small and medium-sized vessel ischemia. 3. Occluded left MCA posterior M2 branch about 10 mm beyond its origin. 4. Negative more proximal anterior circulation and otherwise negative intracranial MRA.   Electronically Signed   By: Lars Pinks M.D.   On: 04/06/2013 20:38    CT of the brain   IMPRESSION:  Evolving infarct in the distribution of the left middle cerebral  artery in the left parietal lobe. No associated hemorrhage is noted.  No other focal abnormality is noted.  MRI of the brain   IMPRESSION:  1. Moderate-sized acute posterior left MCA infarct. Edema without  significant mass effect. No associated hemorrhage at this time.  2. Underlying small areas of chronic small and medium-sized vessel  ischemia.  3. Occluded left MCA posterior M2 branch about 10 mm  beyond its  origin.  4. Negative more proximal anterior circulation and otherwise  negative intracranial MRA.  MRA of the brain    See above. 2D Echocardiogram    Carotid Doppler    CXR    EKG   Sinus rhythm with 1st degree A-V block with Premature supraventricular complexes Nonspecific T wave abnormality Prolonged QT Abnormal ECG  Therapy Recommendations Pending  Physical Exam  General: The patient is alert and cooperative at the time of the examination. The patient is moderately obese.  Skin: No significant peripheral edema is noted.   Neurologic Exam  Mental status: The patient is oriented x 3.  Cranial nerves: Facial symmetry is not present. Minimal depression of the right NL fold. Speech is normal, no aphasia or dysarthria is noted. Extraocular movements are full. Visual fields are full.  Motor: The patient has good strength in all 4 extremities, with the exception that there is 4-/5 grip strength of the right hand, 4+/5 strength proximately in the right arm, left extremities are of normal strength. The right leg is trace weak.  Sensory examination: Soft touch sensation is symmetric on the legs, slightly decreased on the right face and arm.  Coordination: The patient has good finger-nose-finger and heel-to-shin bilaterally, except for minimal ataxia of the right arm.  Gait and station: The gait was not tested. No drift is seen.  Reflexes: Deep tendon reflexes are symmetric, but are depressed.    ASSESSMENT Ms. Marie Malone is a 62 y.o. female presenting with a mild right hemiparesis. The patient was on 325 mg aspirin prior to admission. Patient was not a TPA candidate secondary to delay in presentation. The patient has a residual weakness mainly involving the right upper extremity. The patient has unusual movement of the right arm, and a half "Alien limb syndrome". MRI showed occlusion of M2 branch is, moderate-sized left hemisphere infarct.   Left middle  cerebral artery infarct  Hypertension  History of rheumatic fever  Dyslipidemia  Obstructive sleep apnea  Obesity  Hospital day # 1  TREATMENT/PLAN  Change to plavix  Carotid Doppler  EEG study  Physical and occupational therapy evaluation  2-D echocardiogram  WILLIS,CHARLES KEITH  04/07/2013 10:17 AM

## 2013-04-07 NOTE — Progress Notes (Signed)
Marie Malone [id number] DOB: 1950/12/31 DOA: 04/06/2013 PCP: [name], NP  Brief narrative: 62 year old ?, prior stroke 2004, rheumatic fever S./B. valvuloplasty [facility] 2008, severe pulmonary hypertension, mild OSA for sleep study 2008, mod mitral valve stenosis area of 1.1 cm, low-risk nuclear study 08/27/12, admitted 11/28 right-sided weakness-CT head = left MCA artery parietal lobe infarct, MRI confirms moderate acute ST or left MCA infarct with edema and no significant hemorrhage TPA not given as outside of the window Initial NIHSS= 7  Past medical history-As per Problem list Chart reviewed as below- Reviewed as above  Consultants:  Neurology  Procedures:  MRI as above  Carotids pending  Echo pending  Antibiotics:  None   Subjective  Doing fair. No further issues overnight. Seems to be back at baseline. Patient's neurological exam is inconsistent   Objective    Interim History:   Telemetry: Sinus rhythm  Objective: Filed Vitals:   04/07/13 0200 04/07/13 0314 04/07/13 0534 04/07/13 0800  BP: 155/79  148/53 137/64  Pulse: 73 75 71 78  Temp:   98 F (36.7 C)   TempSrc:   Oral   Resp: 17 17 25 12   Height:      Weight:      SpO2: 99% 99% 98% 98%    Intake/Output Summary (Last 24 hours) at 04/07/13 0813 Last data filed at 04/07/13 0600  Gross per 24 hour  Intake      0 ml  Output   1650 ml  Net  -1650 ml    Exam:  General: EOMI, NCAT, no pallor Cardiovascular: S1-S2 no murmur rub or gallop Respiratory: Clinically clear Abdomen: Soft nontender nondistended Skin no lower extremity edema Neuro finger-nose-finger test on left side is normal, right side he should has past-pointing, however she is able to coordinate putting her that back on her head properly. Vision by direct confrontation is normal. Smile symmetrical Power 5/5 bilaterally Reflexes equivocal 8 not assessed  Data Reviewed: Basic Metabolic Panel:  Recent  Labs Lab 04/06/13 1325 04/06/13 2033  NA 140  --   K 3.2*  --   CL 102  --   CO2 24  --   GLUCOSE 105*  --   BUN 18  --   CREATININE 0.70 0.59  CALCIUM 9.6  --    Liver Function Tests:  Recent Labs Lab 04/06/13 1325  AST 26  ALT 23  ALKPHOS 98  BILITOT 0.8  PROT 9.5*  ALBUMIN 4.2   No results found for this basename: LIPASE, AMYLASE,  in the last 168 hours No results found for this basename: AMMONIA,  in the last 168 hours CBC:  Recent Labs Lab 04/06/13 1325 04/06/13 2033  WBC 4.8 5.1  NEUTROABS 2.8  --   HGB 12.5 13.0  HCT 39.2 39.7  MCV 85.8 84.3  PLT 182 156   Cardiac Enzymes:  Recent Labs Lab 04/06/13 1325  TROPONINI <0.30   BNP: No components found with this basename: POCBNP,  CBG:  Recent Labs Lab 04/06/13 1323  GLUCAP 94    Recent Results (from the past 240 hour(s))  MRSA PCR SCREENING     Status: None   Collection Time    04/06/13  5:08 PM      Result Value Range Status   MRSA by PCR NEGATIVE  NEGATIVE Final   Comment:            The GeneXpert MRSA Assay (FDA     approved for NASAL specimens  only), is one component of a     comprehensive MRSA colonization     surveillance program. It is not     intended to diagnose MRSA     infection nor to guide or     monitor treatment for     MRSA infections.     Studies:              All Imaging reviewed and is as per above notation   Scheduled Meds: . aspirin  325 mg Oral Daily  . doxycycline  100 mg Oral BID  . heparin  5,000 Units Subcutaneous Q8H   Continuous Infusions:    Assessment/Plan: Stroke-left MCA distribution clinically-on stroke pathway. Admitted to step down secondary to reported initial fluctuating mental status, symptoms and signs. Vital signs every 2 hourly initially, NIHSS now and q12, close step down monitoring-continue aspirin 325 mg daily Active Problems:   Hyperlipidemia-LDL 114, per guidelines needs high-intensity statin-started Lipitor 80 mg daily HTN  (hypertension)-allow permissive hypertension over 24-48 hours, reimplement amlodipine 10, metoprolol 25 XL on 11/29  History of rheumatic fever-currently stable  Mitral stenosis-stable.  Follow echocardiogram  S/P aortic valvotomy 2008-stable -follow echocardiogram Moderate to severe pulmonary hypertension-patient will get echocardiogram for stroke workup. Monitor. Possible independent risk factor for stroke?  Obstructive sleep apnea-mild-patient snores at night, trial CPAP machine.   Code Status: Full Family Communication: Discussed with daughter as well as sister at bedside Disposition Plan: For now keep in step down unit-may be able to transfer out 11/29/p.m.   Verneita Griffes, MD  [facility] Pager [phone] 04/07/2013, 8:13 AM    LOS: 1 day

## 2013-04-08 LAB — BASIC METABOLIC PANEL
Chloride: 103 mEq/L (ref 96–112)
GFR calc Af Amer: >90 mL/min (ref >90)
GFR calc non Af Amer: 90 mL/min — ABNORMAL LOW (ref >90)
Glucose, Bld: 95 mg/dL (ref 70–99)
Potassium: 3.5 mEq/L (ref 3.5–5.1)
Sodium: 139 mEq/L (ref 135–145)

## 2013-04-08 LAB — GLUCOSE, CAPILLARY
Glucose-Capillary: 94 mg/dL (ref 70–99)
Glucose-Capillary: 98 mg/dL (ref 70–99)
Glucose-Capillary: 83 mg/dL (ref 70–99)
Glucose-Capillary: 87 mg/dL (ref 70–99)

## 2013-04-08 MED ORDER — WHITE PETROLATUM GEL
Status: AC
  Administered 2013-04-08: 0.2
  Filled 2013-04-08: qty 5

## 2013-04-08 NOTE — Evaluation (Signed)
Occupational Therapy Evaluation Patient Details Name: Marie Malone MRN: [id number] DOB: December 20, 1950 Today's Date: 04/08/2013 Time: 1352-1430 OT Time Calculation (min): 38 min  OT Assessment / Plan / Recommendation History of present illness Marie Malone is a 62 y.o. female with a history of stroke who got up to go to the bathroom 04/05/2013 and then subsequently fell. Since that time has noticed right-sided weakness. It has never gone away completely during this time frame. She has been dropping things; MRI showed occlusion of M2 branch is, moderate-sized left hemisphere infarct.    Clinical Impression   Pt admitted with above.  Pt will benefit from continued acute OT services to address below problem list. Continues to present with decreased RUE strength and coordination. Recommend outpatient OT to further progress rehab of RUE.  Also recommend a speech consult due to pt with some expressive difficulty and pocketing food in left side of mouth per family and PT report.    OT Assessment  Patient needs continued OT Services    Follow Up Recommendations  Outpatient OT    Barriers to Discharge      Equipment Recommendations  None recommended by OT    Recommendations for Other Services Speech consult  Frequency  Min 3X/week    Precautions / Restrictions     Pertinent Vitals/Pain See vitals    ADL  Eating/Feeding: Performed;Set up Where Assessed - Eating/Feeding: Chair Grooming: Performed;Wash/dry hands;Wash/dry face;Set up Where Assessed - Grooming: Supported sitting Toilet Transfer: Simulated;Modified independent Toilet Transfer Method: Sit to Loss adjuster, chartered:  (chair>ambulating in room>return to chair) Transfers/Ambulation Related to ADLs: mod I ADL Comments: Pt presents with significant decreased functional use in RUE.  Pt with decreased sensation and unable to detect noxious stimuli (pinching nail bed of fingers, pinching hand).  Educated pt and family on  safety awareness at home and in community and to be aware RUE in order to prevent injury.  Pt is very impulsive with her RUE movements and requires VCs to slow down and focus.  Provided pt with therapy ball and soft theraputty (yellow) with handouts for exercises.  Also educated pt and family on household activities that pt can participate in to help increase functional use of RUE, such as folding laundry, picking up coins, turning pages in magazine.     OT Diagnosis: Paresis  OT Problem List: Decreased strength;Impaired UE functional use;Impaired sensation;Decreased coordination OT Treatment Interventions: Self-care/ADL training;Therapeutic exercise;Neuromuscular education;Patient/family education;Therapeutic activities   OT Goals(Current goals can be found in the care plan section) Acute Rehab OT Goals Patient Stated Goal: to be independent OT Goal Formulation: With patient/family Time For Goal Achievement: 04/15/13 Potential to Achieve Goals: Good  Visit Information  Last OT Received On: 04/08/13 Assistance Needed: +1 History of Present Illness: Marie Malone is a 62 y.o. female with a history of stroke who got up to go to the bathroom 04/05/2013 and then subsequently fell. Since that time has noticed right-sided weakness. It has never gone away completely during this time frame. She has been dropping things; MRI showed occlusion of M2 branch is, moderate-sized left hemisphere infarct.        Prior [place] expects to be discharged to:: Private residence Living Arrangements: Alone Available Help at Discharge: Family;Available 24 hours/day Type of Home: House Home Access: Stairs to enter [employer] of Steps: 4 Home Layout: One level Prior Function Level of Independence: Independent Dominant Hand: Left  Vision/Perception Vision - History Baseline Vision: Wears glasses all the time Patient Visual Report: No change from  baseline   Cognition  Cognition Arousal/Alertness: Awake/alert Behavior During Therapy: WFL for tasks assessed/performed (slightly impulsive with use of right hand) Overall Cognitive Status: Within Functional Limits for tasks assessed    Extremity/Trunk Assessment Upper Extremity Assessment Upper Extremity Assessment: RUE deficits/detail RUE Deficits / Details: 4/5 throughout RUE Sensation: decreased light touch;decreased proprioception RUE Coordination: decreased fine motor;decreased gross motor     Mobility Transfers Transfers: Stand to Sit;Sit to Stand Sit to Stand: 6: Modified independent (Device/Increase time);From chair/3-in-1 Stand to Sit: 6: Modified independent (Device/Increase time);To chair/3-in-1     Exercise     Balance     End of Session OT - End of Session Activity Tolerance: Patient tolerated treatment well Patient left: in chair;with call bell/phone within reach;with family/visitor present Nurse Communication: Mobility status  GO   04/08/2013 Darrol Jump OTR/L Pager [phone] Office [phone]   Darrol Jump 04/08/2013, 3:25 PM

## 2013-04-08 NOTE — Progress Notes (Signed)
Stroke Team Progress Note  HISTORY  Marie Malone is a 62 y.o. female with a history of stroke who got up to go to the bathroom 04/05/2013 and then subsequently fell. Since that time has noticed right-sided weakness. It has never gone away completely during this time frame. She has been dropping things.  Family has noticed slurred speech as well.  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    LKW: Prior to going to bed at 11/27  tpa given?: no, outside of window  NIHSS: 6  Patient was not a TPA candidate secondary to duration of symptoms.   SUBJECTIVE The patient is alert and cooperative, feels well. The patient wants to go home.  OBJECTIVE Most recent Vital Signs: Filed Vitals:   04/07/13 2027 04/07/13 2221 04/08/13 0529 04/08/13 1038  BP: 138/55 149/65 146/56 143/60  Pulse: 71 73 63 64  Temp: 98.5 F (36.9 C) 98.9 F (37.2 C) 98.9 F (37.2 C) 97.9 F (36.6 C)  TempSrc: Oral Oral Oral Oral  Resp: 18 16 18 20   Height:      Weight:      SpO2: 100% 96% 100% 100%   CBG (last 3)   Recent Labs  04/07/13 1650 04/07/13 2219 04/08/13 0711  GLUCAP 91 94 94    IV Fluid Intake:     MEDICATIONS  . amLODipine  10 mg Oral Daily  . atorvastatin  80 mg Oral q1800  . clopidogrel  75 mg Oral Q breakfast  . doxycycline  100 mg Oral BID  . heparin  5,000 Units Subcutaneous Q8H  . metoprolol succinate  25 mg Oral Daily  . potassium chloride  20 mEq Oral BID   PRN:  acetaminophen  Diet:  General thin liquids Activity:  Bathroom privileges, Up with assistance DVT Prophylaxis:  SQ heparin  CLINICALLY SIGNIFICANT STUDIES Basic Metabolic Panel:   Recent Labs Lab 04/07/13 0920 04/08/13 0630  NA 139 139  K 3.1* 3.5  CL 101 103  CO2 23 21  GLUCOSE 116* 95   BUN 10 14  CREATININE 0.65 0.74  CALCIUM 9.2 9.1   Liver Function Tests:   Recent Labs Lab 04/06/13 1325  AST 26  ALT 23  ALKPHOS 98  BILITOT 0.8  PROT 9.5*  ALBUMIN 4.2   CBC:   Recent Labs Lab 04/06/13 1325 04/06/13 2033  WBC 4.8 5.1  NEUTROABS 2.8  --   HGB 12.5 13.0  HCT 39.2 39.7  MCV 85.8 84.3  PLT 182 156   Coagulation:   Recent Labs Lab 04/06/13 1325  LABPROT 14.3  INR 1.13   Cardiac Enzymes:   Recent Labs Lab 04/06/13 1325  TROPONINI <0.30   Urinalysis:   Recent Labs Lab 04/06/13 1520  COLORURINE YELLOW  LABSPEC 1.013  PHURINE 6.5  GLUCOSEU NEGATIVE  HGBUR NEGATIVE  BILIRUBINUR NEGATIVE  KETONESUR NEGATIVE  PROTEINUR NEGATIVE  UROBILINOGEN 1.0  NITRITE NEGATIVE  LEUKOCYTESUR NEGATIVE   Lipid Panel    Component Value Date/Time   CHOL 185 04/07/2013 0527   TRIG 106 04/07/2013 0527   HDL 50 04/07/2013 0527   CHOLHDL 3.7 04/07/2013 0527   VLDL 21 04/07/2013 0527   LDLCALC 114* 04/07/2013 0527   HgbA1C  Lab Results  Component Value Date   HGBA1C  6.3* 04/06/2013    Urine Drug Screen:     Component Value Date/Time   LABOPIA NONE DETECTED 04/06/2013 1520   COCAINSCRNUR NONE DETECTED 04/06/2013 1520   LABBENZ NONE DETECTED 04/06/2013 1520   AMPHETMU NONE DETECTED 04/06/2013 1520   THCU NONE DETECTED 04/06/2013 1520   LABBARB NONE DETECTED 04/06/2013 1520    Alcohol Level:   Recent Labs Lab 04/06/13 1325  ETH <11    Ct Head Wo Contrast 04/06/2013    Evolving infarct in the distribution of the left middle cerebral artery in the left parietal lobe. No associated hemorrhage is noted. No other focal abnormality is noted.     Mri/ MRA Brain Without Contrast 04/06/2013     1. Moderate-sized acute posterior left MCA infarct. Edema without significant mass effect. No associated hemorrhage at this time. 2. Underlying small areas of chronic small and medium-sized vessel ischemia. 3. Occluded left MCA posterior M2 branch  about 10 mm beyond its origin. 4. Negative more proximal anterior circulation and otherwise negative intracranial MRA.     CT of the brain   IMPRESSION:  Evolving infarct in the distribution of the left middle cerebral  artery in the left parietal lobe. No associated hemorrhage is noted.  No other focal abnormality is noted.  MRI of the brain   IMPRESSION:  1. Moderate-sized acute posterior left MCA infarct. Edema without  significant mass effect. No associated hemorrhage at this time.  2. Underlying small areas of chronic small and medium-sized vessel  ischemia.  3. Occluded left MCA posterior M2 branch about 10 mm beyond its  origin.  4. Negative more proximal anterior circulation and otherwise  negative intracranial MRA.  MRA of the brain    See above.  2D Echocardiogram  - ejection fraction 55-60%. No cardiac source of emboli identified.  Carotid Doppler  Preliminary report: There is 60-79% Right ICA stenosis. There is 1-39% left ICA stenosis. Vertebral artery flow is antegrade.   CXR    EKG   Sinus rhythm with 1st degree A-V block with Premature supraventricular complexes Nonspecific T wave abnormality Prolonged QT Abnormal ECG  Therapy Recommendations Pending  Physical Exam  General: The patient is alert and cooperative at the time of the examination. The patient is moderately obese.  Skin: No significant peripheral edema is noted.   Neurologic Exam  Mental status: The patient is oriented x 3.  Cranial nerves: Facial symmetry is not present. Minimal depression of the right NL fold. Speech is normal, no aphasia or dysarthria is noted. Extraocular movements are full. Visual fields are full.  Motor: The patient has good strength in all 4 extremities, with the exception that there is 4/5 grip strength of the right hand, 4+/5 strength proximately in the right arm, left extremities are of normal strength. The right leg is trace weak.  Sensory examination: Soft  touch sensation is symmetric on the legs, slightly decreased on the right face and arm.  Coordination: The patient has good finger-nose-finger and heel-to-shin bilaterally, except for minimal ataxia of the right arm.  Gait and station: The gait was not tested. No drift is seen.  Reflexes: Deep tendon reflexes are symmetric, but are depressed.    ASSESSMENT Marie Malone is a 62 y.o. female presenting with a mild right hemiparesis. The patient was on 325 mg aspirin prior to admission. Patient was not a TPA candidate secondary to delay in presentation. The patient has a residual weakness mainly involving the right upper extremity. The patient  has unusual movement of the right arm, and a half "Alien limb syndrome". MRI showed occlusion of M2 branch is, moderate-sized left hemisphere infarct.   Left middle cerebral artery infarct  Hypertension  History of rheumatic fever  Dyslipidemia  Obstructive sleep apnea  Obesity  Hospital day # 2  No further events of right arm movements or confusion.  TREATMENT/PLAN   Changed to Plavix 75 mg daily.  Carotid Doppler - 60-79% right internal carotid artery stenosis - will need to follow, does not correlate with the current stroke event  EEG study - 04/07/2013 - report pending.  Physical and occupational therapy evaluation - pending   Lowry Ram [facility] Pager [phone] 04/08/2013, 11:25 AM  Lenor Coffin

## 2013-04-08 NOTE — Progress Notes (Addendum)
Marie Malone [id number] DOB: 11/21/1950 DOA: 04/06/2013 PCP: [name], NP  Brief narrative: 62 year old ?, prior stroke 2004, rheumatic fever S./B. valvuloplasty [facility] 2008, severe pulmonary hypertension, mild OSA for sleep study 2008, mod mitral valve stenosis area of 1.1 cm, low-risk nuclear study 08/27/12, admitted 11/28 right-sided weakness-CT head = left MCA artery parietal lobe infarct, MRI confirms moderate acute ST or left MCA infarct with edema and no significant hemorrhage TPA not given as outside of the window Initial NIHSS= 7 Has had repetitive event with mildly altered mental status as well as right arm weakness therefore an EEG was ordered by neurology to rule out seizure activity which could be causing this in the setting of CVA  Past medical history-As per Problem list Chart reviewed as below- Reviewed as above  Consultants:  Neurology  Procedures:  MRI as above  Carotids 1-39% L  Carotid 60-75% Right carotid  Echo grade 2 diastolic dysfunction, moderate mitral regurgitation with calcified mitral valve  Antibiotics:  None   Subjective  Doing fair. Still with some right-sided weakness and deficit Tolerating diet well-is noncompliant with salt restriction at home Otherwise doing fairly well   Objective    Interim History:   Telemetry: Sinus rhythm  Objective: Filed Vitals:   04/07/13 2027 04/07/13 2221 04/08/13 0529 04/08/13 1038  BP: 138/55 149/65 146/56 143/60  Pulse: 71 73 63 64  Temp: 98.5 F (36.9 C) 98.9 F (37.2 C) 98.9 F (37.2 C) 97.9 F (36.6 C)  TempSrc: Oral Oral Oral Oral  Resp: 18 16 18 20   Height:      Weight:      SpO2: 100% 96% 100% 100%    Intake/Output Summary (Last 24 hours) at 04/08/13 1123 Last data filed at 04/08/13 0845  Gross per 24 hour  Intake    240 ml  Output      0 ml  Net    240 ml    Exam:  General: EOMI, NCAT, no pallor Cardiovascular: S1-S2 no murmur rub or  gallop Respiratory: Clinically clear Abdomen: Soft nontender nondistended Skin no lower extremity edema Neuro finger-nose-finger test on left side is normal, right side he should has past-pointing, however she is able to coordinate putting her that back on her head properly. Vision by direct confrontation is normal. Smile symmetrical Power 5/5 bilaterally Reflexes equivocal HEENT not assessed  Data Reviewed: Basic Metabolic Panel:  Recent Labs Lab 04/06/13 1325 04/06/13 2033 04/07/13 0920 04/08/13 0630  NA 140  --  139 139  K 3.2*  --  3.1* 3.5  CL 102  --  101 103  CO2 24  --  23 21  GLUCOSE 105*  --  116* 95  BUN 18  --  10 14  CREATININE 0.70 0.59 0.65 0.74  CALCIUM 9.6  --  9.2 9.1   Liver Function Tests:  Recent Labs Lab 04/06/13 1325  AST 26  ALT 23  ALKPHOS 98  BILITOT 0.8  PROT 9.5*  ALBUMIN 4.2   No results found for this basename: LIPASE, AMYLASE,  in the last 168 hours No results found for this basename: AMMONIA,  in the last 168 hours CBC:  Recent Labs Lab 04/06/13 1325 04/06/13 2033  WBC 4.8 5.1  NEUTROABS 2.8  --   HGB 12.5 13.0  HCT 39.2 39.7  MCV 85.8 84.3  PLT 182 156   Cardiac Enzymes:  Recent Labs Lab 04/06/13 1325  TROPONINI <0.30   BNP: No components found with this basename:  POCBNP,  CBG:  Recent Labs Lab 04/07/13 0814 04/07/13 1155 04/07/13 1650 04/07/13 2219 04/08/13 0711  GLUCAP 89 83 91 94 94    Recent Results (from the past 240 hour(s))  MRSA PCR SCREENING     Status: None   Collection Time    04/06/13  5:08 PM      Result Value Range Status   MRSA by PCR NEGATIVE  NEGATIVE Final   Comment:            The GeneXpert MRSA Assay (FDA     approved for NASAL specimens     only), is one component of a     comprehensive MRSA colonization     surveillance program. It is not     intended to diagnose MRSA     infection nor to guide or     monitor treatment for     MRSA infections.     Studies:               All Imaging reviewed and is as per above notation   Scheduled Meds: . amLODipine  10 mg Oral Daily  . atorvastatin  80 mg Oral q1800  . clopidogrel  75 mg Oral Q breakfast  . doxycycline  100 mg Oral BID  . heparin  5,000 Units Subcutaneous Q8H  . metoprolol succinate  25 mg Oral Daily  . potassium chloride  20 mEq Oral BID   Continuous Infusions:    Assessment/Plan: Stroke-left MCA distribution clinically-on stroke pathway. Doing better-EEG ordered by neurology is pending -continue aspirin 325 mg daily for secondary prevention-this was changed to Plavix 75 mg by neurology Active Problems:   Hyperlipidemia-LDL 114, per guidelines needs high-intensity statin-started Lipitor 80 mg daily HTN (hypertension)-allow permissive hypertension over 24-48 hours, reimplement amlodipine 10, metoprolol 25 XL on 11/29  Compensated chronic grade 2 diastolic dysfunction-needs [facility]-continue metoprolol 25 daily History of rheumatic fever-currently stable  Mitral stenosis-stable.   Hypokalemia-continue replacement 20 mEq twice a day S/P aortic valvotomy 2008-stable -follow echocardiogram Moderate to severe pulmonary hypertension-Possible independent risk factor for stroke?  Obstructive sleep apnea-mild-patient snores at night, trial CPAP machine-need pulmonary referral as an outpt R Carotid disease 60-79%-needs follow up Dopplers in 6 mo c Vascular   Code Status: Full Family Communication: Discussed with daughter as well as sister at bedside Disposition Plan: per neurology   Verneita Griffes, MD  [facility] Pager [phone] 04/08/2013, 11:23 AM    LOS: 2 days

## 2013-04-08 NOTE — Evaluation (Signed)
Physical Therapy Evaluation Patient Details Name: Marie Malone MRN: [id number] DOB: [birth year] Today's Date: 04/08/2013 Time: [id number] PT Time Calculation (min): 25 min  PT Assessment / Plan / Recommendation History of Present Illness  Marie Malone is a 62 y.o. female with a history of stroke who got up to go to the bathroom 04/05/2013 and then subsequently fell. Since that time has noticed right-sided weakness. It has never gone away completely during this time frame. She has been dropping things; MRI showed occlusion of M2 branch is, moderate-sized left hemisphere infarct.   Clinical Impression  Patient evaluated by Physical Therapy with no further acute PT needs identified. All education has been completed and the patient has no further questions.  See below for any follow-up Physial Therapy or equipment needs. PT is signing off. Thank you for this referral.     PT Assessment  Patent does not need any further PT services    Follow Up Recommendations  Other (comment);Supervision/Assistance - 24 hour (Outpt OT)    Does the patient have the potential to tolerate intense rehabilitation      Barriers to Discharge        Equipment Recommendations  None recommended by PT    Recommendations for Other Services Speech consult (per daughter, pt is pocketing food)   Frequency      Precautions / Restrictions     Pertinent Vitals/Pain no apparent distress       Mobility  Bed Mobility Bed Mobility: Supine to Sit;Sitting - Scoot to Edge of Bed Supine to Sit: 6: Modified independent (Device/Increase time) Sitting - Scoot to Edge of Bed: 6: Modified independent (Device/Increase time) Details for Bed Mobility Assistance: smooth transitions Transfers Transfers: Sit to Stand;Stand to Sit Sit to Stand: 6: Modified independent (Device/Increase time);From chair/3-in-1 Stand to Sit: 6: Modified independent (Device/Increase time);To chair/3-in-1 Ambulation/Gait Ambulation/Gait  Assistance: 7: Independent Ambulation Distance (Feet): 520 Feet Assistive device: None Ambulation/Gait Assistance Details: Grossly WNL; no loss of balance noted Gait Pattern: Within Functional Limits;Step-through pattern Gait velocity: Grossly WNL General Gait Details: Educated pt on signs and symptoms of CVA during walk; she remembered the acronym BE FAST, but had trouble remembering what the letters stand for Stairs: Yes Stairs Assistance: 4: Min assist [name] Details (indicate cue type and reason): Handheld assist to simulate daughter assist at home; Pt able to step reciprocally Stair Management Technique: No rails Number of Stairs: 5    Exercises     PT Diagnosis:    PT Problem List:   PT Treatment Interventions:       PT Goals(Current goals can be found in the care plan section) Acute Rehab PT Goals Patient Stated Goal: to be independent PT Goal Formulation: No goals set, d/c therapy  Visit Information  Last PT Received On: 04/08/13 Assistance Needed: +1 History of Present Illness: Marie Malone is a 62 y.o. female with a history of stroke who got up to go to the bathroom 04/05/2013 and then subsequently fell. Since that time has noticed right-sided weakness. It has never gone away completely during this time frame. She has been dropping things; MRI showed occlusion of M2 branch is, moderate-sized left hemisphere infarct.        Prior [place] expects to be discharged to:: Private residence Living Arrangements: Alone Available Help at Discharge: Family;Available 24 hours/day Type of Home: House Home Access: Stairs to enter [employer] of Steps: 4 Entrance Stairs-Rails: None Home Layout: One level Prior Function Level  of Independence: Independent Dominant Hand: Left    Cognition  Cognition Arousal/Alertness: Awake/alert Behavior During Therapy: WFL for tasks assessed/performed (slightly impulsive with use of  right hand) Overall Cognitive Status: Within Functional Limits for tasks assessed    Extremity/Trunk Assessment Upper Extremity Assessment Upper Extremity Assessment: Defer to OT evaluation RUE Deficits / Details: 4/5 throughout RUE Sensation: decreased light touch;decreased proprioception RUE Coordination: decreased fine motor;decreased gross motor Lower Extremity Assessment Lower Extremity Assessment: Overall WFL for tasks assessed   Balance    End of Session PT - End of Session Activity Tolerance: Patient tolerated treatment well Patient left: in chair;with call bell/phone within reach;Other (comment) (with OT initiating session) Nurse Communication: Mobility status  GP     Roney Marion [facility] [place], [place]  04/08/2013, 4:42 PM

## 2013-04-09 ENCOUNTER — Ambulatory Visit: Payer: BC Managed Care – PPO | Admitting: Family

## 2013-04-09 ENCOUNTER — Telehealth: Payer: Self-pay | Admitting: Family

## 2013-04-09 ENCOUNTER — Inpatient Hospital Stay (HOSPITAL_COMMUNITY): Payer: BC Managed Care – PPO

## 2013-04-09 LAB — GLUCOSE, CAPILLARY
Glucose-Capillary: 85 mg/dL (ref 70–99)
Glucose-Capillary: 85 mg/dL (ref 70–99)
Glucose-Capillary: 109 mg/dL — ABNORMAL HIGH (ref 70–99)

## 2013-04-09 MED ORDER — CLOPIDOGREL BISULFATE 75 MG PO TABS: 75.0000 mg | ORAL_TABLET | Freq: Every day | ORAL | Status: DC

## 2013-04-09 MED ORDER — SODIUM CHLORIDE 0.9 % IV SOLN
INTRAVENOUS | Status: DC
  Administered 2013-04-09: 23:00:00 via INTRAVENOUS

## 2013-04-09 MED ORDER — ATORVASTATIN CALCIUM 80 MG PO TABS: 80.0000 mg | ORAL_TABLET | Freq: Every day | ORAL | Status: DC

## 2013-04-09 NOTE — Progress Notes (Signed)
Spoke with [name] who doesn't recall afib as a problem in the past Consulted [place] cards for possible TEE am 12/2.  Hold d/c home for now  Verneita Griffes, MD [facility] [phone]

## 2013-04-09 NOTE — Progress Notes (Signed)
Stroke Team Progress Note  HISTORY Marie Malone is a 62 y.o. female with a history of stroke who got up to go to the bathroom 04/05/2013 and then subsequently fell. Since that time has noticed right-sided weakness. It has never gone away completely during this time frame. She has been dropping things. Family has noticed slurred speech as well. 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 Patient was not a TPA candidate secondary to delay in arrival.  SUBJECTIVE Patient just back in room after having EEG.  OBJECTIVE Most recent Vital Signs: Filed Vitals:   04/09/13 0025 04/09/13 0118 04/09/13 0546 04/09/13 1049  BP:  155/81 156/71 133/61  Pulse: 76 71 67 76  Temp:   98.2 F (36.8 C) 97.8 F (36.6 C)  TempSrc:   Axillary Oral  Resp: 14 20 18 18   Height:      Weight:      SpO2: 96% 98% 95% 100%   CBG (last 3)   Recent Labs  04/08/13 1627 04/08/13 2141 04/09/13 0648  GLUCAP 83 87 85    IV Fluid Intake:     MEDICATIONS  . amLODipine  10 mg Oral Daily  . atorvastatin  80 mg Oral q1800  . clopidogrel  75 mg Oral Q breakfast  . doxycycline  100 mg Oral BID  . heparin  5,000 Units Subcutaneous Q8H  . metoprolol succinate  25 mg Oral Daily  . potassium chloride  20 mEq Oral BID   PRN:  acetaminophen  Diet:  Dysphagia 3 thin liquids Activity:  Bathroom privileges, Up with assistance DVT Prophylaxis:  SQ heparin  CLINICALLY SIGNIFICANT STUDIES Basic Metabolic Panel:   Recent Labs Lab 04/07/13 0920 04/08/13 0630  NA 139 139  K 3.1* 3.5  CL 101 103  CO2 23 21  GLUCOSE 116* 95  BUN 10 14  CREATININE 0.65 0.74  CALCIUM 9.2 9.1   Liver Function Tests:   Recent Labs Lab 04/06/13 1325  AST 26  ALT 23  ALKPHOS 98  BILITOT 0.8  PROT 9.5*  ALBUMIN 4.2    CBC:   Recent Labs Lab 04/06/13 1325 04/06/13 2033  WBC 4.8 5.1  NEUTROABS 2.8  --   HGB 12.5 13.0  HCT 39.2 39.7  MCV 85.8 84.3  PLT 182 156   Coagulation:   Recent Labs Lab 04/06/13 1325  LABPROT 14.3  INR 1.13   Cardiac Enzymes:   Recent Labs Lab 04/06/13 1325  TROPONINI <0.30   Urinalysis:   Recent Labs Lab 04/06/13 1520  COLORURINE YELLOW  LABSPEC 1.013  PHURINE 6.5  GLUCOSEU NEGATIVE  HGBUR NEGATIVE  BILIRUBINUR NEGATIVE  KETONESUR NEGATIVE  PROTEINUR NEGATIVE  UROBILINOGEN 1.0  NITRITE NEGATIVE  LEUKOCYTESUR NEGATIVE   Lipid Panel    Component Value Date/Time   CHOL 185 04/07/2013 0527   TRIG 106 04/07/2013 0527   HDL 50 04/07/2013 0527   CHOLHDL 3.7 04/07/2013 0527   VLDL 21 04/07/2013 0527   LDLCALC 114* 04/07/2013 0527   HgbA1C  Lab Results  Component Value Date   HGBA1C 6.3* 04/06/2013    Urine Drug Screen:     Component Value Date/Time   LABOPIA NONE DETECTED 04/06/2013 1520   COCAINSCRNUR NONE DETECTED 04/06/2013 1520   LABBENZ NONE DETECTED 04/06/2013 1520  AMPHETMU NONE DETECTED 04/06/2013 [place] DETECTED 04/06/2013 [place] DETECTED 04/06/2013 1520    Alcohol Level:   Recent Labs Lab 04/06/13 [place] <11    CT Head 04/06/2013   Evolving infarct in the distribution of the left middle cerebral artery in the left parietal lobe. No associated hemorrhage is noted. No other focal abnormality is noted.    MRI Brain  04/06/2013   1. Moderate-sized acute posterior left MCA infarct. Edema without significant mass effect. No associated hemorrhage at this time. 2. Underlying small areas of chronic small and medium-sized vessel ischemia.   MRA Brain  04/06/2013 Occluded left MCA posterior M2 branch about 10 mm beyond its origin. . Negative more proximal anterior circulation and otherwise negative intracranial MRA.     2D Echocardiogram  - ejection fraction 55-60%. No cardiac source of emboli  identified.  Carotid Doppler   There is 60-79% Right ICA stenosis. There is 1-39% left ICA stenosis. Vertebral artery flow is antegrade.  EEG no seizures    EKG  Sinus rhythm with 1st degree A-V block with. Premature supraventricular complexes. Nonspecific T wave abnormality. Prolonged QT. Abnormal ECG  Therapy Recommendations OP OT  Physical Exam General: The patient is alert and cooperative at the time of the examination. The patient is moderately obese. Skin: No significant peripheral edema is noted.  Neurologic Exam Mental status: The patient is oriented x 3. Cranial nerves: Facial symmetry is not present. Minimal depression of the right NL fold. Speech is normal, no aphasia or dysarthria is noted. Extraocular movements are full. Visual fields are full. Motor: The patient has good strength in all 4 extremities, with the exception that there is 4/5 grip strength of the right hand, 4+/5 strength proximately in the right arm, left extremities are of normal strength. The right leg is trace weak. Sensory examination: Soft touch sensation is symmetric on the legs, slightly decreased on the right face and arm.astereognosia right hand and mild sensory ataxia RUE Coordination: The patient has good finger-nose-finger and heel-to-shin bilaterally, except for minimal ataxia of the right arm. Gait and station: The gait was not tested. No drift is seen. Reflexes: Deep tendon reflexes are symmetric, but are depressed.  ASSESSMENT Ms. Marie Malone is a 63 y.o. female presenting with a mild right hemiparesis. 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  Infarct felt to be embolic  secondary to unknown source. The patient was on 325 mg aspirin prior to admission. The patient is now on Plavix 75 mg daily. The patient has a residual weakness mainly involving the right upper extremity,RUE with sensory ataxia, stereognosia.    Hypertension  History of rheumatic fever - pt has  been on couamdin in the past - ? Mitral stenosis vs atrial fibrillation - would like comment from cardiology  Dyslipidemia, LDL 114, on no statin prior to admission, now on lipitor 80 mg daily.  Obstructive sleep apnea, on CPAP  Obesity, Body mass index is 36.24 kg/(m^2).   60-79% right internal carotid artery stenosis - incidental finding.  does not correlate with the current stroke event  Hospital day # 3  TREATMENT/PLAN  Continue Plavix 75 mg daily for secondary stroke prevention  F/u Carotid Doppler in 6 mos to assess incidental  60-79% right internal carotid artery stenosis   OP OT  Continue statin at discharge.  Consult cardiology related ? Need for coumadin vs TEE to look for embolic source.  If positive for PFO (patent foramen ovale), check bilateral lower extremity venous dopplers to rule out DVT as possible source of stroke.   If TEE determined necessary and then negative, recommend implantable loop recorder to evaluate for atrial fibrillation as etiology of stroke.   Dr. Leonie Man discussed diagnosis, prognosis,  treatment options and plan of care with pt, sister and Dr. Verlon Au. Dr. Verlon Au will call cardiology - pt has seen [name] in the past.   Burnetta Sabin, MSN, RN, ANVP-BC, ANP-BC, GNP-BC [facility] Pager: [phone] 04/09/2013 12:12 PM  I have personally obtained a history, examined the patient, evaluated imaging results, and formulated the assessment and plan of care. I agree with the above. Antony Contras, MD

## 2013-04-09 NOTE — Progress Notes (Signed)
Occupational Therapy Treatment Patient Details Name: Marie Malone MRN: [id number] DOB: 1950-09-04 Today's Date: 04/09/2013 Time: [id number] OT Time Calculation (min): 31 min  OT Assessment / Plan / Recommendation  History of present illness Marie Malone is a 62 y.o. female with a history of stroke who got up to go to the bathroom 04/05/2013 and then subsequently fell. Since that time has noticed right-sided weakness. It has never gone away completely during this time frame. She has been dropping things; MRI showed occlusion of M2 branch is, moderate-sized left hemisphere infarct.    OT comments  Session focused on theraputty and squeeze ball exercises for RUE. Also, fine motor coordination handout given to pt. Provided education to pt and family member.   Follow Up Recommendations  Outpatient OT;Supervision/Assistance - 24 hour    Barriers to Discharge       Equipment Recommendations  None recommended by OT    Recommendations for Other Services Speech consult  Frequency Min 3X/week   Progress towards OT Goals Progress towards OT goals: Progressing toward goals  Plan Discharge plan needs to be updated    Precautions / Restrictions     Pertinent Vitals/Pain No pain reported.     ADL  Lower Body Dressing: Other (comment);Moderate assistance (tying shoe) Where Assessed - Lower Body Dressing: Supported sitting Toilet Transfer: Modified independent Toilet Transfer Method: Sit to Loss adjuster, chartered:  (from bed) Equipment Used: Other (comment) (theraputty and squeeze ball) Transfers/Ambulation Related to ADLs: Mod I ADL Comments: Session focused on exercises for Rt hand with theraputty and squeeze ball. Pt with decreased sensation in RUE as well as decreased coordination. Handout for fine motor coordination activities given to pt and explained to family member as well. Pt practiced tying shoe. Educated to avoid stove and knives for safety as pt has decreased sensation  in RUE. Educated that if pt uses scissors as fine motor activity that daughter be present for safety.  Educated on cutting back on sodium intake as it increases chance for stroke.    OT Diagnosis:    OT Problem List:   OT Treatment Interventions:     OT Goals(current goals can now be found in the care plan section) Acute Rehab OT Goals Patient Stated Goal: not stated OT Goal Formulation: With patient/family Time For Goal Achievement: 04/15/13 Potential to Achieve Goals: Good ADL Goals Additional ADL Goal #1: Pt will be able to indepednently demonstrate 3 fine motor activities she can perform at home using right hand. Additional ADL Goal #2: Pt will independently perform theraputty HEP 2x daily with RUE. Additional ADL Goal #3: Pt will demonstrate good safety awareness of RUE during all ADL and mobility tasks.  Visit Information  Last OT Received On: 04/09/13 Assistance Needed: +1 History of Present Illness: Marie Malone is a 62 y.o. female with a history of stroke who got up to go to the bathroom 04/05/2013 and then subsequently fell. Since that time has noticed right-sided weakness. It has never gone away completely during this time frame. She has been dropping things; MRI showed occlusion of M2 branch is, moderate-sized left hemisphere infarct.     Subjective Data      Prior Functioning       Cognition  Cognition Arousal/Alertness: Awake/alert Behavior During Therapy: WFL for tasks assessed/performed Overall Cognitive Status: Within Functional Limits for tasks assessed    Mobility  Bed Mobility Bed Mobility: Supine to Sit Supine to Sit: 5: Supervision Details for Bed Mobility Assistance: Cues to  be aware where RUE is at  Transfers Transfers: Sit to Stand;Stand to Sit Sit to Stand: From bed;6: Modified independent (Device/Increase time) Stand to Sit: To chair/3-in-1;6: Modified independent (Device/Increase time)    Exercises  Other Exercises Other Exercises:  theraputty and squeeze ball exercises   Balance     End of Session OT - End of Session Activity Tolerance: Patient tolerated treatment well Patient left: in chair;with call bell/phone within reach;with family/visitor present  GO     Benito Mccreedy OTR/L [id number] 04/09/2013, 2:47 PM

## 2013-04-09 NOTE — Procedures (Signed)
History: 62 yo F with stroke and abnormal arm movements  Background: Most of the EEG was recorded in drowsiness or sleep.There is irregular slow activity associated with these states.  There is an asymmetry to sleep structures, seen better on the right than left.   During periods of maximal wakefullness, there is a well defined posterior dominant rhythm of 9 Hz that attenuates with eye opening.   Photic stimulation: Physiologic driving is not performed  EEG Abnormalities: 1) asymetric sleep structures  Clinical Interpretation: This EEG is suggestive of a left sided cerebral dysfunction consistent with the patient's known infarct. There was no seizure or seizure predisposition recorded on this study.   Roland Rack, MD [facility] [phone]  If 7pm- 7am, please page neurology on call at [phone].

## 2013-04-09 NOTE — Progress Notes (Signed)
Talked to patient about DCP; patient will be staying with her daughter Marie Malone at discharge [phone]); patient is agreeable to Outpatient therapies at [facility]- orders/ clinical information faxed as requested to [phone]; Noted orders for CPAP - awaiting for CPAP settings for [facility] to deliver the CPAP to the room. Patient needs a sleep study also ; Sleep study scheduled for 05/07/2013 at 8pm at [facility]- sleep study orders need to be completed by  MD and faxed to confirm apt; Marie Malone

## 2013-04-09 NOTE — Progress Notes (Signed)
EEG Completed; Results Pending  

## 2013-04-09 NOTE — Discharge Summary (Addendum)
Physician Discharge Summary  Marie Malone [id number] DOB: 07-28-1950 DOA: 04/06/2013  PCP: [name], NP  Admit date: 04/06/2013 Discharge date: 04/09/2013  Time spent: 35 minutes  Recommendations for Outpatient Follow-up:  1. Needs outpatient followup and Outpt PT  2. Continue Plavix daily 3. Secondary prevention with statin another medication as well as tight blood pressure control 4. Followup with neurology in about one month to 2 months 5. followup with cardiology electrophysiologist Dr. Rayann Heman for loop recorder reading in about one to 2 months  Discharge Diagnoses:  Principal Problem:   Stroke Active Problems:   HTN (hypertension)   History of rheumatic fever   Mitral stenosis   S/P aortic valvotomy 2008   Moderate to severe pulmonary hypertension   Discharge Condition: Stable  Diet recommendation: Heart healthy  Filed Weights   04/06/13 1311 04/06/13 1654  Weight: 89.359 kg (197 lb) 89.9 kg (198 lb 3.1 oz)    History of present illness:  62 year old ?, prior stroke 2004, rheumatic fever S./B. valvuloplasty [facility] 2008, severe pulmonary hypertension, mild OSA for sleep study 2008, mod mitral valve stenosis area of 1.1 cm, low-risk nuclear study 08/27/12, admitted 11/28 right-sided weakness-CT head = left MCA artery parietal lobe infarct, MRI confirms moderate acute ST or left MCA infarct with edema and no significant hemorrhage  TPA not given as outside of the window  Initial NIHSS= 7  Has had repetitive event with mildly altered mental status as well as right arm weakness therefore an EEG was ordered by neurology to rule out seizure activity which could be causing this in the setting of CVA Neurology felt it was appropriate to get a rule out PFO and therefore TEE was performed on 04/10/13 which was negative and subsequently loop recorder was placed to rule out subclinical/occult atrial fibrillation   Hospital Course:  Stroke-left MCA distribution  clinically-on stroke pathway. Doing better-EEG ordered by neurology is negative for any type of seizure activity-aspirin 325 mg daily  changed to Plavix 75 mg by neurology Further workup inclusive of TEE as well as loop recorder were performed  Active Problems:  Hyperlipidemia-LDL 114, per guidelines needs high-intensity statin-started Lipitor 80 mg daily  HTN (hypertension)-allow permissive hypertension over 24-48 hours, reimplement amlodipine 10, metoprolol 25 XL on 11/29  Compensated chronic grade 2 diastolic dysfunction-needs followup at [facility] soon-continue metoprolol 25 daily  History of rheumatic fever-currently stable  Mitral stenosis-stable.  Hypokalemia-continue replacement 20 mEq twice a day  S/P aortic valvotomy 2008-stable -see above Moderate to severe pulmonary hypertension-Possible independent risk factor for stroke?  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%-needs follow up Dopplers in 6 mo c Vascular   Consultants:  Neurology Procedures:  MRI as above  Carotids 1-39% L  Carotid 60-75% Right carotid  Echo grade 2 diastolic dysfunction, moderate mitral regurgitation with calcified mitral valve TEE 04/10/13 Loop recorder implantation 04/11/39 Antibiotics:  None  Discharge Exam: Filed Vitals:   04/09/13 1049  BP: 133/61  Pulse: 76  Temp: 97.8 F (36.6 C)  Resp: 18   Alert pleasant oriented no apparent distress Just back from EEG  General: EOMI NCAT Resolving upper lower extremity deficit  Discharge Instructions     Medication List    STOP taking these medications       aspirin 325 MG EC tablet      TAKE these medications       amLODipine 10 MG tablet  Commonly known as:  NORVASC  Take 1 tablet (10 mg total) by mouth daily.     atorvastatin 80 MG tablet  Commonly known as:  LIPITOR  Take 1 tablet (80 mg total) by mouth daily at 6 PM.     clopidogrel 75 MG  tablet  Commonly known as:  PLAVIX  Take 1 tablet (75 mg total) by mouth daily with breakfast.     doxycycline 100 MG tablet  Commonly known as:  VIBRA-TABS  Take 1 tablet (100 mg total) by mouth 2 (two) times daily.     metoprolol succinate 25 MG 24 hr tablet  Commonly known as:  TOPROL-XL  Take 1 tablet (25 mg total) by mouth daily.       No Known Allergies     Follow-up Information   Follow up with Forbes Cellar, MD. Schedule an appointment as soon as possible for a visit in 2 months. (stroke clinic)    Specialties:  Neurology, Radiology   Contact information:   [address] [place] [address] [phone]        The results of significant diagnostics from this hospitalization (including imaging, microbiology, ancillary and laboratory) are listed below for reference.    Significant Diagnostic Studies: Ct Head Wo Contrast  04/06/2013   CLINICAL DATA:  Right-sided weakness and slurred speech  EXAM: CT HEAD WITHOUT CONTRAST  TECHNIQUE: Contiguous axial images were obtained from the base of the skull through the vertex without intravenous contrast.  COMPARISON:  09/25/2006  FINDINGS: The bony calvarium is intact. No gross soft tissue abnormality is noted. There is a geographic area identified in the left parietal lobe which roughly measures 3.9 x 3.4 cm in greatest dimension consistent with an evolving infarct. No significant mass effect is noted. No significant midline shift is seen. No acute hemorrhage or space-occupying mass lesion is noted.  IMPRESSION: Evolving infarct in the distribution of the left middle cerebral artery in the left parietal lobe. No associated hemorrhage is noted. No other focal abnormality is noted.  These results were called by telephone at the time of interpretation on 04/06/2013 at 1:58 PM to Dr. Wendi Snipes , who verbally acknowledged these results.   Electronically Signed   By: Inez Catalina M.D.   On: 04/06/2013 14:00   Mri Brain  Without Contrast  04/06/2013   CLINICAL DATA:  62 year old female with acute onset right side weakness and slurred speech. Left MCA infarcts suspected on CT. Initial encounter. History of remote infarcts.  EXAM: MRI HEAD WITHOUT CONTRAST  MRA HEAD WITHOUT CONTRAST  TECHNIQUE: Multiplanar, multiecho pulse sequences of the brain and surrounding structures were obtained without intravenous contrast. Angiographic images of the head were obtained using MRA technique without contrast.  COMPARISON:  Head CTs 04/06/2013 and earlier.  FINDINGS: MRI HEAD FINDINGS  Moderate size confluent restricted diffusion in the posterior left MCA territory corresponding to the earlier CT finding. The area of diffusion abnormality encompasses 5-6 cm. There is T2 an less pronounced FLAIR hyperintensity an gyral edema. There is an associated conspicuous focus of increased FLAIR signal within a left posterior MCA branch on series 7, image 8. Downstream branches also show asymmetric increased FLAIR signal (images 9 and 10). MRA findings are below.  No acute intracranial hemorrhage identified. No right hemisphere or posterior fossa restricted diffusion. Other Major intracranial vascular flow voids are preserved.  Small chronic left cerebellar lacunar infarct. Small bilateral chronic basal ganglia lacunar infarcts versus perivascular spaces. There is also a small focus of cortical encephalomalacia  in the right sylvian fissure on series 7, image 10. Otherwise minimal superimposed nonspecific right hemisphere periventricular white matter T2 and FLAIR hyperintensity.  No midline shift, mass effect, evidence of mass lesion, ventriculomegaly, extra-axial collection or acute intracranial hemorrhage. Cervicomedullary junction and pituitary are within normal limits. Negative visualized cervical spine. Visualized bone marrow signal is within normal limits. Visualized orbit soft tissues are within normal limits. Minor paranasal sinus mucosal thickening.  Mild left mastoid fluid. Negative nasopharynx. Visualized scalp soft tissues are within normal limits.  MRA HEAD FINDINGS  Antegrade flow in the posterior circulation. Dominant distal right vertebral artery. Normal PICA origins. Patent vertebrobasilar junction. No basilar stenosis. SCA and PCA origins are normal. Posterior communicating arteries are diminutive or absent. Bilateral PCA branches are within normal limits.  Antegrade flow in both ICA siphons. No ICA siphon stenosis. Patent carotid termini. Normal visualized bilateral ACA branches. The anterior communicating artery is diminutive or absent. Visualized right MCA branches are within normal limits.  Normal left MCA origin. The left M1 segment is patent. The dominant left MCA posterior sylvian branch is occluded about 10 mm beyond its origin (series 5, images 69 -73). This corresponds to the level of the abnormal FLAIR signal described above. The left MCA anterior division branches appear within normal limits.  IMPRESSION: 1. Moderate-sized acute posterior left MCA infarct. Edema without significant mass effect. No associated hemorrhage at this time. 2. Underlying small areas of chronic small and medium-sized vessel ischemia. 3. Occluded left MCA posterior M2 branch about 10 mm beyond its origin. 4. Negative more proximal anterior circulation and otherwise negative intracranial MRA.   Electronically Signed   By: Lars Pinks M.D.   On: 04/06/2013 20:38   Mr Jodene Nam Head/brain Wo Cm  04/06/2013   CLINICAL DATA:  62 year old female with acute onset right side weakness and slurred speech. Left MCA infarcts suspected on CT. Initial encounter. History of remote infarcts.  EXAM: MRI HEAD WITHOUT CONTRAST  MRA HEAD WITHOUT CONTRAST  TECHNIQUE: Multiplanar, multiecho pulse sequences of the brain and surrounding structures were obtained without intravenous contrast. Angiographic images of the head were obtained using MRA technique without contrast.  COMPARISON:  Head CTs  04/06/2013 and earlier.  FINDINGS: MRI HEAD FINDINGS  Moderate size confluent restricted diffusion in the posterior left MCA territory corresponding to the earlier CT finding. The area of diffusion abnormality encompasses 5-6 cm. There is T2 an less pronounced FLAIR hyperintensity an gyral edema. There is an associated conspicuous focus of increased FLAIR signal within a left posterior MCA branch on series 7, image 8. Downstream branches also show asymmetric increased FLAIR signal (images 9 and 10). MRA findings are below.  No acute intracranial hemorrhage identified. No right hemisphere or posterior fossa restricted diffusion. Other Major intracranial vascular flow voids are preserved.  Small chronic left cerebellar lacunar infarct. Small bilateral chronic basal ganglia lacunar infarcts versus perivascular spaces. There is also a small focus of cortical encephalomalacia in the right sylvian fissure on series 7, image 10. Otherwise minimal superimposed nonspecific right hemisphere periventricular white matter T2 and FLAIR hyperintensity.  No midline shift, mass effect, evidence of mass lesion, ventriculomegaly, extra-axial collection or acute intracranial hemorrhage. Cervicomedullary junction and pituitary are within normal limits. Negative visualized cervical spine. Visualized bone marrow signal is within normal limits. Visualized orbit soft tissues are within normal limits. Minor paranasal sinus mucosal thickening. Mild left mastoid fluid. Negative nasopharynx. Visualized scalp soft tissues are within normal limits.  MRA HEAD FINDINGS  Antegrade flow in  the posterior circulation. Dominant distal right vertebral artery. Normal PICA origins. Patent vertebrobasilar junction. No basilar stenosis. SCA and PCA origins are normal. Posterior communicating arteries are diminutive or absent. Bilateral PCA branches are within normal limits.  Antegrade flow in both ICA siphons. No ICA siphon stenosis. Patent carotid termini.  Normal visualized bilateral ACA branches. The anterior communicating artery is diminutive or absent. Visualized right MCA branches are within normal limits.  Normal left MCA origin. The left M1 segment is patent. The dominant left MCA posterior sylvian branch is occluded about 10 mm beyond its origin (series 5, images 69 -73). This corresponds to the level of the abnormal FLAIR signal described above. The left MCA anterior division branches appear within normal limits.  IMPRESSION: 1. Moderate-sized acute posterior left MCA infarct. Edema without significant mass effect. No associated hemorrhage at this time. 2. Underlying small areas of chronic small and medium-sized vessel ischemia. 3. Occluded left MCA posterior M2 branch about 10 mm beyond its origin. 4. Negative more proximal anterior circulation and otherwise negative intracranial MRA.   Electronically Signed   By: Lars Pinks M.D.   On: 04/06/2013 20:38    Microbiology: Recent Results (from the past 240 hour(s))  MRSA PCR SCREENING     Status: None   Collection Time    04/06/13  5:08 PM      Result Value Range Status   MRSA by PCR NEGATIVE  NEGATIVE Final   Comment:            The GeneXpert MRSA Assay (FDA     approved for NASAL specimens     only), is one component of a     comprehensive MRSA colonization     surveillance program. It is not     intended to diagnose MRSA     infection nor to guide or     monitor treatment for     MRSA infections.     Labs: Basic Metabolic Panel:  Recent Labs Lab 04/06/13 1325 04/06/13 2033 04/07/13 0920 04/08/13 0630  NA 140  --  139 139  K 3.2*  --  3.1* 3.5  CL 102  --  101 103  CO2 24  --  23 21  GLUCOSE 105*  --  116* 95  BUN 18  --  10 14  CREATININE 0.70 0.59 0.65 0.74  CALCIUM 9.6  --  9.2 9.1   Liver Function Tests:  Recent Labs Lab 04/06/13 1325  AST 26  ALT 23  ALKPHOS 98  BILITOT 0.8  PROT 9.5*  ALBUMIN 4.2   No results found for this basename: LIPASE, AMYLASE,  in  the last 168 hours No results found for this basename: AMMONIA,  in the last 168 hours CBC:  Recent Labs Lab 04/06/13 1325 04/06/13 2033  WBC 4.8 5.1  NEUTROABS 2.8  --   HGB 12.5 13.0  HCT 39.2 39.7  MCV 85.8 84.3  PLT 182 156   Cardiac Enzymes:  Recent Labs Lab 04/06/13 1325  TROPONINI <0.30   BNP: BNP (last 3 results) No results found for this basename: PROBNP,  in the last 8760 hours CBG:  Recent Labs Lab 04/08/13 0711 04/08/13 1135 04/08/13 1627 04/08/13 2141 04/09/13 0648  GLUCAP 94 98 83 87 85   Signed:  Layne Lebon, JAI-GURMUKH  [facility] 04/09/2013, 12:16 PM

## 2013-04-09 NOTE — Evaluation (Signed)
Clinical/Bedside Swallow Evaluation Patient Details  Name: Marie Malone MRN: [id number] Date of Birth: 12-09-1950  Today's Date: 04/09/2013 Time: [id number] SLP Time Calculation (min): 39 min  Past Medical History:  Past Medical History  Diagnosis Date  . Hypertension   . Stroke   . History of rheumatic fever     as a child  . Hyperlipidemia   . Mitral stenosis   . OSA (obstructive sleep apnea)     mild per sleep study 2008   Past Surgical History:  Past Surgical History  Procedure Laterality Date  . Cardiac surgery  2008    "balloon surgery at [facility]"  . Appendectomy  1989   HPI:  Marie Malone is a 62 y.o. female with a history of stroke who got up to go to the bathroom last night then subsequently fell and since that time has noticed right-sided weakness. It has never gone away completely during this time frame. He has been dropping things.  Family has noticed slurred speech as well.  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 appears to have a mild oral phase dysphagia, combined with missing and poor condition of existing dentition.  Her oral dysphagia is characterized by reduced labial ROM and decreased oral sensation, resulting in pocketing of foods in right lateral sulcus.  Pt. also has several missing teeth, and reports she normally chews on the right due to missing lower teeth on left.  Pt. wears a partial plate for front uppers.  Pharyngeal function appears to be intact.    Aspiration Risk  Mild    Diet Recommendation Dysphagia 3 (Mechanical Soft);Thin liquid (Chopped meats)   Liquid Administration via: Cup;Straw Medication Administration: Whole meds with liquid Supervision: Patient able to  self feed;Intermittent supervision to cue for compensatory strategies Compensations: Slow rate;Small sips/bites;Check for pocketing Postural Changes and/or Swallow Maneuvers: Seated upright 90 degrees    Other  Recommendations Oral Care Recommendations: Oral care Q4 per protocol (After each meal) Other Recommendations: Clarify dietary restrictions   Follow Up Recommendations  24 hour supervision/assistance    Frequency and Duration        Pertinent Vitals/Pain    Lung sounds clear; afebrile.    SLP Swallow Goals   n/a  Swallow Study Prior Functional Status       General HPI: Marie Malone is a 62 y.o. female with a history of stroke who got up to go to the bathroom last night then subsequently fell and since that time has noticed right-sided weakness. It has never gone away completely during this time frame. He has been dropping things.  Family has noticed slurred speech as well.  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 is currently in the hospital recovering from cardiac problems Type of Study: Bedside swallow evaluation Previous Swallow Assessment: None Diet Prior to this Study: Regular;Thin liquids Temperature Spikes Noted: No Respiratory Status: Room air History of Recent Intubation: No Behavior/Cognition: Alert;Cooperative;Pleasant mood;Distractible;Decreased sustained attention Oral Cavity - Dentition: Missing dentition;Poor condition Self-Feeding Abilities: Needs assist Patient Positioning: Upright in bed  Baseline Vocal Quality: Clear Volitional Cough: Strong Volitional Swallow: Able to elicit    Oral/Motor/Sensory Function Overall Oral Motor/Sensory Function: Impaired Labial ROM: Reduced right Labial Symmetry: Abnormal symmetry right Labial Strength: Reduced Labial Sensation: Reduced Lingual ROM:  (Unable to  elevate tongue tip) Lingual Strength: Reduced Lingual Sensation: Reduced Facial ROM: Reduced right Facial Symmetry: Right droop Facial Strength: Reduced Facial Sensation: Reduced Velum: Within Functional Limits Mandible: Within Functional Limits   Ice Chips Ice chips: Within functional limits Presentation: Spoon   Thin Liquid Thin Liquid: Within functional limits Presentation: Cup;Straw    Nectar Thick Nectar Thick Liquid: Not tested   Honey Thick Honey Thick Liquid: Not tested   Puree Puree: Within functional limits Presentation: Spoon   Solid   GO    Solid: Impaired Oral Phase Impairments: Reduced lingual movement/coordination Oral Phase Functional Implications: Right lateral sulci pocketing       Marie Malone 04/09/2013,9:46 AM

## 2013-04-09 NOTE — Telephone Encounter (Signed)
Please call pt and arrange hospital follow up in 1-2 weeks.

## 2013-04-09 NOTE — Progress Notes (Signed)
Pt. Was placed on CPAP auto titrate (Min: 5, Max: 20) via nasal mask. Pt. Is tolerating CPAP well at this time without any complications.

## 2013-04-10 ENCOUNTER — Encounter (HOSPITAL_COMMUNITY)
Admission: EM | Disposition: A | Discharge status: Home / Self Care | Payer: Self-pay | Source: Home / Self Care | Attending: Family Medicine

## 2013-04-10 ENCOUNTER — Encounter (HOSPITAL_COMMUNITY): Payer: Self-pay | Admitting: *Deleted

## 2013-04-10 DIAGNOSIS — I6350 Cerebral infarction due to unspecified occlusion or stenosis of unspecified cerebral artery: Secondary | ICD-10-CM

## 2013-04-10 DIAGNOSIS — I059 Rheumatic mitral valve disease, unspecified: Secondary | ICD-10-CM

## 2013-04-10 HISTORY — PX: LOOP RECORDER IMPLANT: SHX5477

## 2013-04-10 HISTORY — PX: LOOP RECORDER IMPLANT: SHX5954

## 2013-04-10 HISTORY — PX: TEE WITHOUT CARDIOVERSION: SHX5443

## 2013-04-10 LAB — GLUCOSE, CAPILLARY: Glucose-Capillary: 86 mg/dL (ref 70–99)

## 2013-04-10 SURGERY — LOOP RECORDER IMPLANT
Anesthesia: LOCAL

## 2013-04-10 SURGERY — ECHOCARDIOGRAM, TRANSESOPHAGEAL
Anesthesia: Moderate Sedation

## 2013-04-10 MED ORDER — MIDAZOLAM HCL 10 MG/2ML IJ SOLN
INTRAMUSCULAR | Status: DC | PRN
  Administered 2013-04-10 (×2): 2 mg via INTRAVENOUS

## 2013-04-10 MED ORDER — FENTANYL CITRATE 0.05 MG/ML IJ SOLN
INTRAMUSCULAR | Status: AC
  Filled 2013-04-10: qty 2

## 2013-04-10 MED ORDER — LIDOCAINE HCL (PF) 1 % IJ SOLN
INTRAMUSCULAR | Status: AC
  Filled 2013-04-10: qty 30

## 2013-04-10 MED ORDER — MIDAZOLAM HCL 5 MG/ML IJ SOLN
INTRAMUSCULAR | Status: AC
  Filled 2013-04-10: qty 2

## 2013-04-10 MED ORDER — FENTANYL CITRATE 0.05 MG/ML IJ SOLN
INTRAMUSCULAR | Status: DC | PRN
  Administered 2013-04-10 (×2): 25 ug via INTRAVENOUS

## 2013-04-10 NOTE — Telephone Encounter (Signed)
Left detailed message informing patient to call our office to schedule a hospital follow up visit.

## 2013-04-10 NOTE — H&P (View-Only) (Signed)
ELECTROPHYSIOLOGY CONSULT NOTE  Patient ID: Marie Malone MRN: [id number], DOB/AGE: 1950-10-30   Admit date: 04/06/2013 Date of Consult: 04/10/2013  Primary Physician: [name], NP Primary Cardiologist: [name], MD Reason for Consultation: Cryptogenic stroke; recommendations regarding Implantable Loop Recorder  History of Present Illness Marie Malone was admitted on 04/06/2013 with right sided weakness.  Imaging demonstrated moderate sized acute posterior left MCA infarct. Past medical history is notable for hypertension, hyperlipidemia, sleep apnea, and mitral stenosis (s/p valvuloplasty at [facility] in 2008). She has been monitored on telemetry which has demonstrated no arrhythmias. No cause has been identified. Inpatient stroke work-up is to be completed with a TEE. EP has been asked to evaluate for placement of an implantable loop recorder to monitor for atrial fibrillation.  Past Medical History Past Medical History  Diagnosis Date  . Hypertension   . Stroke   . History of rheumatic fever     as a child  . Hyperlipidemia   . Mitral stenosis   . OSA (obstructive sleep apnea)     mild per sleep study 2008    Past Surgical History Past Surgical History  Procedure Laterality Date  . Cardiac surgery  2008    "balloon surgery at [facility]  . Appendectomy  1989    Allergies/Intolerances No Known Allergies Inpatient Medications . amLODipine  10 mg Oral Daily  . atorvastatin  80 mg Oral q1800  . clopidogrel  75 mg Oral Q breakfast  . doxycycline  100 mg Oral BID  . heparin  5,000 Units Subcutaneous Q8H  . metoprolol succinate  25 mg Oral Daily  . potassium chloride  20 mEq Oral BID   . sodium chloride 20 mL/hr at 04/09/13 2248   Social History History   Social History  . Marital Status: Married    Spouse Name: N/A    Number of Children: 2  . Years of Education: N/A   Occupational History  . Not on file.   Social History Main Topics  . Smoking  status: Never Smoker   . Smokeless tobacco: Not on file  . Alcohol Use: No  . Drug Use: No  . Sexual Activity: Not on file   Other Topics Concern  . Not on file   Social History Narrative   Retired Archivist   Married   She has 2 grown children-   Daughter lives with her   Son lives in [place]   6 grandchildren    Review of Systems General: No chills, fever, night sweats or weight changes  Cardiovascular:  No chest pain, dyspnea on exertion, edema, orthopnea, palpitations, paroxysmal nocturnal dyspnea Dermatological: No rash, lesions or masses Respiratory: No cough, dyspnea Urologic: No hematuria, dysuria Abdominal: No nausea, vomiting, diarrhea, bright red blood per rectum, melena, or hematemesis Neurologic: No visual changes, weakness, changes in mental status All other systems reviewed and are otherwise negative except as noted above.  Physical Exam Blood pressure 139/84, pulse 64, temperature 97.7 F (36.5 C), temperature source Oral, resp. rate 20, height 5\' 2"  (1.575 m), weight 198 lb 3.1 oz (89.9 kg), SpO2 96.00%.  General: Well developed, well appearing 62 y.o. female in no acute distress. HEENT: Normocephalic, atraumatic. EOMs intact. Sclera nonicteric. Oropharynx clear.  Neck: Supple without bruits. No JVD. Lungs: Respirations regular and unlabored, CTA bilaterally. No wheezes, rales or rhonchi. Heart: RRR  Abdomen: Soft, non-tender, non-distended. BS present x 4 quadrants  Extremities: No clubbing, cyanosis or edema. DP/PT/Radials 2+ and equal bilaterally.  Psych: Normal affect. Musculoskeletal: No kyphosis. Skin: Intact. Warm and dry. No rashes or petechiae in exposed areas.   Labs Lab Results  Component Value Date   WBC 5.1 04/06/2013   HGB 13.0 04/06/2013   HCT 39.7 04/06/2013   MCV 84.3 04/06/2013   PLT 156 04/06/2013    Recent Labs Lab 04/06/13 1325  04/08/13 0630  NA 140  < > 139  K 3.2*  < > 3.5  CL 102  < > 103  CO2 24  < > 21   BUN 18  < > 14  CREATININE 0.70  < > 0.74  CALCIUM 9.6  < > 9.1  PROT 9.5*  --   --   BILITOT 0.8  --   --   ALKPHOS 98  --   --   ALT 23  --   --   AST 26  --   --   GLUCOSE 105*  < > 95  < > = values in this interval not displayed. No results found for this basename: INR,  in the last 72 hours  Radiology/Studies Ct Head Wo Contrast 04/06/2013   CLINICAL DATA:  Right-sided weakness and slurred speech  EXAM: CT HEAD WITHOUT CONTRAST  TECHNIQUE: Contiguous axial images were obtained from the base of the skull through the vertex without intravenous contrast.  COMPARISON:  09/25/2006  FINDINGS: The bony calvarium is intact. No gross soft tissue abnormality is noted. There is a geographic area identified in the left parietal lobe which roughly measures 3.9 x 3.4 cm in greatest dimension consistent with an evolving infarct. No significant mass effect is noted. No significant midline shift is seen. No acute hemorrhage or space-occupying mass lesion is noted.  IMPRESSION: Evolving infarct in the distribution of the left middle cerebral artery in the left parietal lobe. No associated hemorrhage is noted. No other focal abnormality is noted.  These results were called by telephone at the time of interpretation on 04/06/2013 at 1:58 PM to Dr. Wendi Snipes , who verbally acknowledged these results.   Electronically Signed   By: Inez Catalina M.D.   On: 04/06/2013 14:00   Mri Brain Without Contrast 04/06/2013   CLINICAL DATA:  62 year old female with acute onset right side weakness and slurred speech. Left MCA infarcts suspected on CT. Initial encounter. History of remote infarcts.  EXAM: MRI HEAD WITHOUT CONTRAST  MRA HEAD WITHOUT CONTRAST  TECHNIQUE: Multiplanar, multiecho pulse sequences of the brain and surrounding structures were obtained without intravenous contrast. Angiographic images of the head were obtained using MRA technique without contrast.  COMPARISON:  Head CTs 04/06/2013 and earlier.  FINDINGS: MRI  HEAD FINDINGS  Moderate size confluent restricted diffusion in the posterior left MCA territory corresponding to the earlier CT finding. The area of diffusion abnormality encompasses 5-6 cm. There is T2 an less pronounced FLAIR hyperintensity an gyral edema. There is an associated conspicuous focus of increased FLAIR signal within a left posterior MCA branch on series 7, image 8. Downstream branches also show asymmetric increased FLAIR signal (images 9 and 10). MRA findings are below.  No acute intracranial hemorrhage identified. No right hemisphere or posterior fossa restricted diffusion. Other Major intracranial vascular flow voids are preserved.  Small chronic left cerebellar lacunar infarct. Small bilateral chronic basal ganglia lacunar infarcts versus perivascular spaces. There is also a small focus of cortical encephalomalacia in the right sylvian fissure on series 7, image 10. Otherwise minimal superimposed nonspecific right hemisphere periventricular white matter T2 and FLAIR hyperintensity.  No midline shift, mass effect, evidence of mass lesion, ventriculomegaly, extra-axial collection or acute intracranial hemorrhage. Cervicomedullary junction and pituitary are within normal limits. Negative visualized cervical spine. Visualized bone marrow signal is within normal limits. Visualized orbit soft tissues are within normal limits. Minor paranasal sinus mucosal thickening. Mild left mastoid fluid. Negative nasopharynx. Visualized scalp soft tissues are within normal limits.  MRA HEAD FINDINGS  Antegrade flow in the posterior circulation. Dominant distal right vertebral artery. Normal PICA origins. Patent vertebrobasilar junction. No basilar stenosis. SCA and PCA origins are normal. Posterior communicating arteries are diminutive or absent. Bilateral PCA branches are within normal limits.  Antegrade flow in both ICA siphons. No ICA siphon stenosis. Patent carotid termini. Normal visualized bilateral ACA  branches. The anterior communicating artery is diminutive or absent. Visualized right MCA branches are within normal limits.  Normal left MCA origin. The left M1 segment is patent. The dominant left MCA posterior sylvian branch is occluded about 10 mm beyond its origin (series 5, images 69 -73). This corresponds to the level of the abnormal FLAIR signal described above. The left MCA anterior division branches appear within normal limits.  IMPRESSION: 1. Moderate-sized acute posterior left MCA infarct. Edema without significant mass effect. No associated hemorrhage at this time. 2. Underlying small areas of chronic small and medium-sized vessel ischemia. 3. Occluded left MCA posterior M2 branch about 10 mm beyond its origin. 4. Negative more proximal anterior circulation and otherwise negative intracranial MRA.   Electronically Signed   By: Lars Pinks M.D.   On: 04/06/2013 20:38   Echocardiogram  04-07-13 demonstrated an EF of 55-60%, no RWMA, grade 2 diastolic dysfunction, mild to moderate MS, LA 47, trivial pericardial effusion  12-lead ECG ekg shows sinus rhythm Telemetry sinus rhythm with no afib  Assessment and Plan:  1. Cryptogenic stroke The patient presents with cryptogenic stroke.  The patient has a TEE planned for this AM.  I spoke at length with the patient about monitoring for afib with either a 30 day event monitor or an implantable loop recorder.  Risks, benefits, and alteratives to implantable loop recorder were discussed with the patient today.   At this time, the patient is very clear in their decision to proceed with implantable loop recorder.   Please call with questions.

## 2013-04-10 NOTE — CV Procedure (Signed)
SURGEON:  Thompson Grayer, MD     PREPROCEDURE DIAGNOSIS:  Cryptogenic Stroke    POSTPROCEDURE DIAGNOSIS:  Cryptogenic Stroke     PROCEDURES:   1. Implantable loop recorder implantation    INTRODUCTION:  Marie Malone is a 62 y.o. female with a history of unexplained stroke who presents today for implantable loop implantation.  The patient has had a cryptogenic stroke.  Despite an extensive workup by neurology, no reversible causes have been identified.  she has worn telemetry during which she did not have arrhythmias.  There is significant concern for possible atrial fibrillation as the cause for the patients stroke.  The patient therefore presents today for implantable loop implantation.     DESCRIPTION OF PROCEDURE:  Informed written consent was obtained, and the patient was brought to the electrophysiology lab in a fasting state.  The patient required no sedation for the procedure today.  Mapping over the patient's chest was performed by the EP lab staff to identify the area where electrograms were most prominent for ILR recording.  This area was found to be the left parasternal region over the 3rd-4th intercostal space. The patients left chest was therefore prepped and draped in the usual sterile fashion by the EP lab staff. The skin overlying the left parasternal region was infiltrated with lidocaine for local analgesia.  A 0.5-cm incision was made over the left parasternal region over the 3rd intercostal space.  A subcutaneous ILR pocket was fashioned using a combination of sharp and blunt dissection.  A Medtronic Reveal [place] model [id number] implantable loop recorder was then placed into the pocket  R waves were very prominent and measured 0.60mV.  Steri- Strips and a sterile dressing were then applied.  There were no early apparent complications.     CONCLUSIONS:   1. Successful implantation of a Medtronic Reveal LINQ implantable loop recorder for cryptogenic stroke  2. No early  apparent complications.

## 2013-04-10 NOTE — Progress Notes (Signed)
Discharge instructions given. Patient and patient's daughter verbalized understanding.

## 2013-04-10 NOTE — CV Procedure (Signed)
See full TEE report in [employer].  Brian Crenshaw  

## 2013-04-10 NOTE — Progress Notes (Signed)
Occupational Therapy Treatment Patient Details Name: SHANAE BEDNAREK MRN: [id number] DOB: [birth year] Today's Date: 04/10/2013 Time: [id number] OT Time Calculation (min): 32 min  OT Assessment / Plan / Recommendation  History of present illness PRANSHI VANHOUTEN is a 62 y.o. female with a history of stroke who got up to go to the bathroom 04/05/2013 and then subsequently fell. Since that time has noticed right-sided weakness. It has never gone away completely during this time frame. She has been dropping things; MRI showed occlusion of M2 branch is, moderate-sized left hemisphere infarct.    OT comments  Session focused on activities for RUE. Continue to recommend Outpatient OT upon d/c.  Follow Up Recommendations  Outpatient OT;Supervision/Assistance - 24 hour    Barriers to Discharge       Equipment Recommendations  None recommended by OT    Recommendations for Other Services Speech consult  Frequency Min 3X/week   Progress towards OT Goals Progress towards OT goals: Progressing toward goals  Plan Discharge plan remains appropriate    Precautions / Restrictions     Pertinent Vitals/Pain No pain reported.     ADL  Toilet Transfer: Modified independent Toilet Transfer Method: Sit to Loss adjuster, chartered: Other (comment) (from bed) Transfers/Ambulation Related to ADLs: Mod I ADL Comments: Session focused on activities for RUE. Pt folded towels, completed dot to dot worksheet, stacked coins and cards, practiced dealing cards, finding coins in putty and placing them in cup. Pt seemed to have difficulty finding letters on dot to dot. OT briefly checked vision and pt inconsistent with field testing. Recommended having MD check pt's vision and talk about her driving prior to d/c.    OT Diagnosis:    OT Problem List:   OT Treatment Interventions:     OT Goals(current goals can now be found in the care plan section) Acute Rehab OT Goals Patient Stated Goal: not stated OT  Goal Formulation: With patient/family Time For Goal Achievement: 04/15/13 Potential to Achieve Goals: Good ADL Goals Additional ADL Goal #1: Pt will be able to indepednently demonstrate 3 fine motor activities she can perform at home using right hand. Additional ADL Goal #2: Pt will independently perform theraputty HEP 2x daily with RUE. Additional ADL Goal #3: Pt will demonstrate good safety awareness of RUE during all ADL and mobility tasks.  Visit Information  Last OT Received On: 04/10/13 Assistance Needed: +1 History of Present Illness: EDRIANA MOAN is a 62 y.o. female with a history of stroke who got up to go to the bathroom 04/05/2013 and then subsequently fell. Since that time has noticed right-sided weakness. It has never gone away completely during this time frame. She has been dropping things; MRI showed occlusion of M2 branch is, moderate-sized left hemisphere infarct.     Subjective Data      Prior Functioning       Cognition  Cognition Arousal/Alertness: Awake/alert Behavior During Therapy: WFL for tasks assessed/performed Overall Cognitive Status: Within Functional Limits for tasks assessed    Mobility  Bed Mobility Bed Mobility: Supine to Sit Supine to Sit: 5: Supervision Transfers Transfers: Sit to Stand;Stand to Sit Sit to Stand: 6: Modified independent (Device/Increase time);From bed Stand to Sit: 6: Modified independent (Device/Increase time);To chair/3-in-1    Exercises      Balance     End of Session OT - End of Session Activity Tolerance: Patient tolerated treatment well Patient left: in chair;with call bell/phone within reach;with family/visitor present  GO  Benito Mccreedy OTR/L [id number] 04/10/2013, 11:59 AM

## 2013-04-10 NOTE — Progress Notes (Signed)
Stroke Team Progress Note  HISTORY Marie Malone is a 62 y.o. female with a history of stroke who got up to go to the bathroom 04/05/2013 and then subsequently fell. Since that time has noticed right-sided weakness. It has never gone away completely during this time frame. She has been dropping things. Family has noticed slurred speech as well. 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 Patient was not a TPA candidate secondary to delay in arrival.  SUBJECTIVE Family at bedside. Dr. Leonie Man spoke with DR. Crenshaw, who does not think pt has had a hx of afib. He is agreeable to TEE and loop. This was explained to pt and her family.  OBJECTIVE Most recent Vital Signs: Filed Vitals:   04/09/13 1809 04/09/13 2116 04/10/13 0542 04/10/13 0944  BP: 133/52 139/60 166/75 139/84  Pulse: 77 80 72 64  Temp: 98.2 F (36.8 C) 98.3 F (36.8 C) 98 F (36.7 C) 97.7 F (36.5 C)  TempSrc: Oral Oral Oral Oral  Resp: 18 18 19 20   Height:      Weight:      SpO2: 100% 99% 98% 96%   CBG (last 3)   Recent Labs  04/09/13 1626 04/09/13 2252 04/10/13 0631  GLUCAP 85 109* 86    IV Fluid Intake:   . sodium chloride 20 mL/hr at 04/09/13 2248    MEDICATIONS  . amLODipine  10 mg Oral Daily  . atorvastatin  80 mg Oral q1800  . clopidogrel  75 mg Oral Q breakfast  . doxycycline  100 mg Oral BID  . heparin  5,000 Units Subcutaneous Q8H  . metoprolol succinate  25 mg Oral Daily  . potassium chloride  20 mEq Oral BID   PRN:  acetaminophen  Diet:  NPO  Activity:  Bathroom privileges, Up with assistance DVT Prophylaxis:  SQ heparin  CLINICALLY SIGNIFICANT STUDIES Basic Metabolic Panel:   Recent Labs Lab 04/07/13 0920 04/08/13 0630  NA 139 139  K 3.1* 3.5  CL 101 103  CO2 23 21   GLUCOSE 116* 95  BUN 10 14  CREATININE 0.65 0.74  CALCIUM 9.2 9.1   Liver Function Tests:   Recent Labs Lab 04/06/13 1325  AST 26  ALT 23  ALKPHOS 98  BILITOT 0.8  PROT 9.5*  ALBUMIN 4.2   CBC:   Recent Labs Lab 04/06/13 1325 04/06/13 2033  WBC 4.8 5.1  NEUTROABS 2.8  --   HGB 12.5 13.0  HCT 39.2 39.7  MCV 85.8 84.3  PLT 182 156   Coagulation:   Recent Labs Lab 04/06/13 1325  LABPROT 14.3  INR 1.13   Cardiac Enzymes:   Recent Labs Lab 04/06/13 1325  TROPONINI <0.30   Urinalysis:   Recent Labs Lab 04/06/13 1520  COLORURINE YELLOW  LABSPEC 1.013  PHURINE 6.5  GLUCOSEU NEGATIVE  HGBUR NEGATIVE  BILIRUBINUR NEGATIVE  KETONESUR NEGATIVE  PROTEINUR NEGATIVE  UROBILINOGEN 1.0  NITRITE NEGATIVE  LEUKOCYTESUR NEGATIVE   Lipid Panel    Component Value Date/Time   CHOL 185 04/07/2013 0527   TRIG 106 04/07/2013 0527   HDL 50 04/07/2013 0527   CHOLHDL 3.7 04/07/2013 0527   VLDL 21 04/07/2013 0527   LDLCALC 114* 04/07/2013 0527   HgbA1C  Lab Results  Component Value Date  HGBA1C 6.3* 04/06/2013    Urine Drug Screen:     Component Value Date/Time   LABOPIA NONE DETECTED 04/06/2013 1520   COCAINSCRNUR NONE DETECTED 04/06/2013 1520   LABBENZ NONE DETECTED 04/06/2013 1520   AMPHETMU NONE DETECTED 04/06/2013 1520   THCU NONE DETECTED 04/06/2013 1520   LABBARB NONE DETECTED 04/06/2013 1520    Alcohol Level:   Recent Labs Lab 04/06/13 [place] <11    CT Head 04/06/2013   Evolving infarct in the distribution of the left middle cerebral artery in the left parietal lobe. No associated hemorrhage is noted. No other focal abnormality is noted.    MRI Brain  04/06/2013   1. Moderate-sized acute posterior left MCA infarct. Edema without significant mass effect. No associated hemorrhage at this time. 2. Underlying small areas of chronic small and medium-sized vessel ischemia.   MRA Brain  04/06/2013 Occluded left MCA posterior M2 branch about  10 mm beyond its origin. . Negative more proximal anterior circulation and otherwise negative intracranial MRA.     2D Echocardiogram  - ejection fraction 55-60%. No cardiac source of emboli identified.  Carotid Doppler   There is 60-79% Right ICA stenosis. There is 1-39% left ICA stenosis. Vertebral artery flow is antegrade.  EEG no seizures    EKG  Sinus rhythm with 1st degree A-V block with. Premature supraventricular complexes. Nonspecific T wave abnormality. Prolonged QT. Abnormal ECG  Therapy Recommendations OP OT  Physical Exam General: The patient is alert and cooperative at the time of the examination. The patient is moderately obese. Skin: No significant peripheral edema is noted.  Neurologic Exam Mental status: The patient is oriented x 3. Cranial nerves: Facial symmetry is not present. Minimal depression of the right NL fold. Speech is normal, no aphasia or dysarthria is noted. Extraocular movements are full. Visual fields are full. Motor: The patient has good strength in all 4 extremities, with the exception that there is 4/5 grip strength of the right hand, 4+/5 strength proximately in the right arm, left extremities are of normal strength. The right leg is trace weak. Sensory examination: Soft touch sensation is symmetric on the legs, slightly decreased on the right face and arm.astereognosia right hand and mild sensory ataxia RUE Coordination: The patient has good finger-nose-finger and heel-to-shin bilaterally, except for minimal ataxia of the right arm. Gait and station: The gait was not tested. No drift is seen. Reflexes: Deep tendon reflexes are symmetric, but are depressed.  ASSESSMENT Ms. Marie Malone is a 62 y.o. female presenting with a mild right hemiparesis. 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  Infarct felt to be embolic  secondary to unknown source. The patient was on 325 mg aspirin prior to admission. The patient is now on  Plavix 75 mg daily. The patient has a residual weakness mainly involving the right upper extremity,RUE with sensory ataxia, stereognosia.    Hypertension  History of rheumatic fever - pt has been on couamdin in the past - ? Mitral stenosis vs atrial fibrillation - would like comment from cardiology  Dyslipidemia, LDL 114, on no statin prior to admission, now on lipitor 80 mg daily.  Obstructive sleep apnea, on CPAP  Obesity, Body mass index is 36.24 kg/(m^2).   60-79% right internal carotid artery stenosis - incidental finding.  does not correlate with the current stroke event  Hospital day # 4  TREATMENT/PLAN  Continue Plavix 75 mg daily for secondary stroke prevention  F/u Carotid  Doppler in 6 mos to assess incidental  60-79% right internal carotid artery stenosis   OP OT  Continue statin at discharge.  TEE today to look for embolic source.  If positive for PFO (patent foramen ovale), check bilateral lower extremity venous dopplers to rule out DVT as possible source of stroke.   If TEE negative, recommend implantable loop recorder to evaluate for atrial fibrillation as etiology of stroke. Cardiology has been notified.  [place] for discharge following TEE/loop today.   Burnetta Sabin, MSN, RN, ANVP-BC, ANP-BC, [facility] Pager: [phone] 04/10/2013 10:32 AM  I have personally obtained a history, examined the patient, evaluated imaging results, and formulated the assessment and plan of care. I agree with the above.  Antony Contras, MD

## 2013-04-10 NOTE — Progress Notes (Signed)
  Echocardiogram Echocardiogram Transesophageal has been performed.  Mauricio Po 04/10/2013, 1:53 PM

## 2013-04-10 NOTE — Consult Note (Signed)
ELECTROPHYSIOLOGY CONSULT NOTE  Patient ID: Marie Malone MRN: [id number], DOB/AGE: [birth year]   Admit date: 04/06/2013 Date of Consult: 04/10/2013  Primary Physician: [name], NP Primary Cardiologist: [name], MD Reason for Consultation: Cryptogenic stroke; recommendations regarding Implantable Loop Recorder  History of Present Illness Marie Malone was admitted on 04/06/2013 with right sided weakness.  Imaging demonstrated moderate sized acute posterior left MCA infarct. Past medical history is notable for hypertension, hyperlipidemia, sleep apnea, and mitral stenosis (s/p valvuloplasty at [facility] in 2008). She has been monitored on telemetry which has demonstrated no arrhythmias. No cause has been identified. Inpatient stroke work-up is to be completed with a TEE. EP has been asked to evaluate for placement of an implantable loop recorder to monitor for atrial fibrillation.  Past Medical History Past Medical History  Diagnosis Date  . Hypertension   . Stroke   . History of rheumatic fever     as a child  . Hyperlipidemia   . Mitral stenosis   . OSA (obstructive sleep apnea)     mild per sleep study 2008    Past Surgical History Past Surgical History  Procedure Laterality Date  . Cardiac surgery  2008    "balloon surgery at [facility]"  . Appendectomy  1989    Allergies/Intolerances No Known Allergies Inpatient Medications . amLODipine  10 mg Oral Daily  . atorvastatin  80 mg Oral q1800  . clopidogrel  75 mg Oral Q breakfast  . doxycycline  100 mg Oral BID  . heparin  5,000 Units Subcutaneous Q8H  . metoprolol succinate  25 mg Oral Daily  . potassium chloride  20 mEq Oral BID   . sodium chloride 20 mL/hr at 04/09/13 2248   Social History History   Social History  . Marital Status: Married    Spouse Name: N/A    Number of Children: 2  . Years of Education: N/A   Occupational History  . Not on file.   Social History Main Topics  . Smoking  status: Never Smoker   . Smokeless tobacco: Not on file  . Alcohol Use: No  . Drug Use: No  . Sexual Activity: Not on file   Other Topics Concern  . Not on file   Social History Narrative   Retired Archivist   Married   She has 2 grown children-   Daughter lives with her   Son lives in [place]   6 grandchildren    Review of Systems General: No chills, fever, night sweats or weight changes  Cardiovascular:  No chest pain, dyspnea on exertion, edema, orthopnea, palpitations, paroxysmal nocturnal dyspnea Dermatological: No rash, lesions or masses Respiratory: No cough, dyspnea Urologic: No hematuria, dysuria Abdominal: No nausea, vomiting, diarrhea, bright red blood per rectum, melena, or hematemesis Neurologic: No visual changes, weakness, changes in mental status All other systems reviewed and are otherwise negative except as noted above.  Physical Exam Blood pressure 139/84, pulse 64, temperature 97.7 F (36.5 C), temperature source Oral, resp. rate 20, height 5\' 2"  (1.575 m), weight 198 lb 3.1 oz (89.9 kg), SpO2 96.00%.  General: Well developed, well appearing 62 y.o. female in no acute distress. HEENT: Normocephalic, atraumatic. EOMs intact. Sclera nonicteric. Oropharynx clear.  Neck: Supple without bruits. No JVD. Lungs: Respirations regular and unlabored, CTA bilaterally. No wheezes, rales or rhonchi. Heart: RRR  Abdomen: Soft, non-tender, non-distended. BS present x 4 quadrants  Extremities: No clubbing, cyanosis or edema. DP/PT/Radials 2+ and equal bilaterally.  Psych: Normal affect. Musculoskeletal: No kyphosis. Skin: Intact. Warm and dry. No rashes or petechiae in exposed areas.   Labs Lab Results  Component Value Date   WBC 5.1 04/06/2013   HGB 13.0 04/06/2013   HCT 39.7 04/06/2013   MCV 84.3 04/06/2013   PLT 156 04/06/2013    Recent Labs Lab 04/06/13 1325  04/08/13 0630  NA 140  < > 139  K 3.2*  < > 3.5  CL 102  < > 103  CO2 24  < > 21   BUN 18  < > 14  CREATININE 0.70  < > 0.74  CALCIUM 9.6  < > 9.1  PROT 9.5*  --   --   BILITOT 0.8  --   --   ALKPHOS 98  --   --   ALT 23  --   --   AST 26  --   --   GLUCOSE 105*  < > 95  < > = values in this interval not displayed. No results found for this basename: INR,  in the last 72 hours  Radiology/Studies Ct Head Wo Contrast 04/06/2013   CLINICAL DATA:  Right-sided weakness and slurred speech  EXAM: CT HEAD WITHOUT CONTRAST  TECHNIQUE: Contiguous axial images were obtained from the base of the skull through the vertex without intravenous contrast.  COMPARISON:  09/25/2006  FINDINGS: The bony calvarium is intact. No gross soft tissue abnormality is noted. There is a geographic area identified in the left parietal lobe which roughly measures 3.9 x 3.4 cm in greatest dimension consistent with an evolving infarct. No significant mass effect is noted. No significant midline shift is seen. No acute hemorrhage or space-occupying mass lesion is noted.  IMPRESSION: Evolving infarct in the distribution of the left middle cerebral artery in the left parietal lobe. No associated hemorrhage is noted. No other focal abnormality is noted.  These results were called by telephone at the time of interpretation on 04/06/2013 at 1:58 PM to Dr. Wendi Snipes , who verbally acknowledged these results.   Electronically Signed   By: Inez Catalina M.D.   On: 04/06/2013 14:00   Mri Brain Without Contrast 04/06/2013   CLINICAL DATA:  62 year old female with acute onset right side weakness and slurred speech. Left MCA infarcts suspected on CT. Initial encounter. History of remote infarcts.  EXAM: MRI HEAD WITHOUT CONTRAST  MRA HEAD WITHOUT CONTRAST  TECHNIQUE: Multiplanar, multiecho pulse sequences of the brain and surrounding structures were obtained without intravenous contrast. Angiographic images of the head were obtained using MRA technique without contrast.  COMPARISON:  Head CTs 04/06/2013 and earlier.  FINDINGS: MRI  HEAD FINDINGS  Moderate size confluent restricted diffusion in the posterior left MCA territory corresponding to the earlier CT finding. The area of diffusion abnormality encompasses 5-6 cm. There is T2 an less pronounced FLAIR hyperintensity an gyral edema. There is an associated conspicuous focus of increased FLAIR signal within a left posterior MCA branch on series 7, image 8. Downstream branches also show asymmetric increased FLAIR signal (images 9 and 10). MRA findings are below.  No acute intracranial hemorrhage identified. No right hemisphere or posterior fossa restricted diffusion. Other Major intracranial vascular flow voids are preserved.  Small chronic left cerebellar lacunar infarct. Small bilateral chronic basal ganglia lacunar infarcts versus perivascular spaces. There is also a small focus of cortical encephalomalacia in the right sylvian fissure on series 7, image 10. Otherwise minimal superimposed nonspecific right hemisphere periventricular white matter T2 and FLAIR hyperintensity.  No midline shift, mass effect, evidence of mass lesion, ventriculomegaly, extra-axial collection or acute intracranial hemorrhage. Cervicomedullary junction and pituitary are within normal limits. Negative visualized cervical spine. Visualized bone marrow signal is within normal limits. Visualized orbit soft tissues are within normal limits. Minor paranasal sinus mucosal thickening. Mild left mastoid fluid. Negative nasopharynx. Visualized scalp soft tissues are within normal limits.  MRA HEAD FINDINGS  Antegrade flow in the posterior circulation. Dominant distal right vertebral artery. Normal PICA origins. Patent vertebrobasilar junction. No basilar stenosis. SCA and PCA origins are normal. Posterior communicating arteries are diminutive or absent. Bilateral PCA branches are within normal limits.  Antegrade flow in both ICA siphons. No ICA siphon stenosis. Patent carotid termini. Normal visualized bilateral ACA  branches. The anterior communicating artery is diminutive or absent. Visualized right MCA branches are within normal limits.  Normal left MCA origin. The left M1 segment is patent. The dominant left MCA posterior sylvian branch is occluded about 10 mm beyond its origin (series 5, images 69 -73). This corresponds to the level of the abnormal FLAIR signal described above. The left MCA anterior division branches appear within normal limits.  IMPRESSION: 1. Moderate-sized acute posterior left MCA infarct. Edema without significant mass effect. No associated hemorrhage at this time. 2. Underlying small areas of chronic small and medium-sized vessel ischemia. 3. Occluded left MCA posterior M2 branch about 10 mm beyond its origin. 4. Negative more proximal anterior circulation and otherwise negative intracranial MRA.   Electronically Signed   By: Lars Pinks M.D.   On: 04/06/2013 20:38   Echocardiogram  04-07-13 demonstrated an EF of 55-60%, no RWMA, grade 2 diastolic dysfunction, mild to moderate MS, LA 47, trivial pericardial effusion  12-lead ECG ekg shows sinus rhythm Telemetry sinus rhythm with no afib  Assessment and Plan:  1. Cryptogenic stroke The patient presents with cryptogenic stroke.  The patient has a TEE planned for this AM.  I spoke at length with the patient about monitoring for afib with either a 30 day event monitor or an implantable loop recorder.  Risks, benefits, and alteratives to implantable loop recorder were discussed with the patient today.   At this time, the patient is very clear in their decision to proceed with implantable loop recorder.   Please call with questions.

## 2013-04-10 NOTE — Progress Notes (Signed)
RT Note:  Placed pt. On CPAP via nasal mask, auto titrate settings.  Pt. Tolerating well at this time.  RN aware.

## 2013-04-10 NOTE — Progress Notes (Signed)
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 There is also note of spontaneous contrast in LAA and RA. Patient has been DCed. I have reviewed labs and hospital course. She has not been febrile and WBC normal. No hematuria and Hgb normal. I think based on above, SBE is unlikely but will contact patient in AM and have blood cultures and ESR drawn. I am also not clear that MV density caused CVA; she is certainly at risk for atrial fibrillation given mitral stenosis and spontaneous contrast noted in LAA. If blood cultures negative, will await results of implantable loop. If atrial fibrillation documented, she would need coumadin (no NOAC given valvular heart disease). Continue plavix for now; I will see her back in the office later this week if possible.  [name]

## 2013-04-10 NOTE — Interval H&P Note (Signed)
History and Physical Interval Note:  04/10/2013 1:03 PM  Marie Malone  has presented today for surgery, with the diagnosis of STROKE  The various methods of treatment have been discussed with the patient and family. After consideration of risks, benefits and other options for treatment, the patient has consented to  Procedure(s): TRANSESOPHAGEAL ECHOCARDIOGRAM (TEE) (N/A) as a surgical intervention .  The patient's history has been reviewed, patient examined, no change in status, stable for surgery.  I have reviewed the patient's chart and labs.  Questions were answered to the patient's satisfaction.     [name]

## 2013-04-11 ENCOUNTER — Encounter (HOSPITAL_COMMUNITY): Payer: Self-pay | Admitting: Cardiology

## 2013-04-11 ENCOUNTER — Other Ambulatory Visit: Payer: BC Managed Care – PPO

## 2013-04-11 ENCOUNTER — Telehealth: Payer: Self-pay | Admitting: *Deleted

## 2013-04-11 DIAGNOSIS — I059 Rheumatic mitral valve disease, unspecified: Secondary | ICD-10-CM

## 2013-04-11 NOTE — Telephone Encounter (Signed)
Spoke with pt dtr, they will go to [address] office today for lab work and appt made for pt to see [place] tomorrow

## 2013-04-11 NOTE — Telephone Encounter (Signed)
Appointment scheduled for 04/17/13

## 2013-04-11 NOTE — Progress Notes (Signed)
Talked to Golden with [facility] about delivery of CPAP machine; CM was informed that the patient is to take the respiratory tubing home with her at discharge and a tech will deliver the CPAP machine to her home today/ adjust settings; Patient was also encouraged to keep her sleep study apt 05/07/2013 and about the copay - daughter was present during the conversation; Aneta Mins [id number]

## 2013-04-11 NOTE — Telephone Encounter (Signed)
Left message for pt to call, per dr [name] the pt had a TEE yesterday and there is something on the mitral valve. He would like the pt to have 2 sets of blood cultures and ESR and then an appt to see him tomorrow.

## 2013-04-12 ENCOUNTER — Encounter: Payer: Self-pay | Admitting: Cardiology

## 2013-04-12 ENCOUNTER — Ambulatory Visit (INDEPENDENT_AMBULATORY_CARE_PROVIDER_SITE_OTHER): Payer: BC Managed Care – PPO | Admitting: Cardiology

## 2013-04-12 VITALS — BP 140/70 | HR 78 | Ht 62.0 in | Wt 186.4 lb

## 2013-04-12 DIAGNOSIS — I639 Cerebral infarction, unspecified: Secondary | ICD-10-CM

## 2013-04-12 DIAGNOSIS — E785 Hyperlipidemia, unspecified: Secondary | ICD-10-CM | POA: Insufficient documentation

## 2013-04-12 DIAGNOSIS — R079 Chest pain, unspecified: Secondary | ICD-10-CM

## 2013-04-12 DIAGNOSIS — I050 Rheumatic mitral stenosis: Secondary | ICD-10-CM

## 2013-04-12 DIAGNOSIS — I679 Cerebrovascular disease, unspecified: Secondary | ICD-10-CM

## 2013-04-12 DIAGNOSIS — Z8673 Personal history of transient ischemic attack (TIA), and cerebral infarction without residual deficits: Secondary | ICD-10-CM | POA: Insufficient documentation

## 2013-04-12 DIAGNOSIS — I059 Rheumatic mitral valve disease, unspecified: Secondary | ICD-10-CM

## 2013-04-12 DIAGNOSIS — I6350 Cerebral infarction due to unspecified occlusion or stenosis of unspecified cerebral artery: Secondary | ICD-10-CM

## 2013-04-12 DIAGNOSIS — I10 Essential (primary) hypertension: Secondary | ICD-10-CM

## 2013-04-12 NOTE — Assessment & Plan Note (Signed)
Continue statin. 

## 2013-04-12 NOTE — Assessment & Plan Note (Signed)
Mild on most recent echocardiogram.

## 2013-04-12 NOTE — Assessment & Plan Note (Signed)
Continue Plavix and statin. Followup carotid Dopplers November 2015.

## 2013-04-12 NOTE — Patient Instructions (Signed)
Your physician recommends that you schedule a follow-up appointment in: [facility] has requested that you have an echocardiogram. Echocardiography is a painless test that uses sound waves to create images of your heart. It provides your doctor with information about the size and shape of your heart and how well your heart's chambers and valves are working. This procedure takes approximately one hour. There are no restrictions for this procedure.SCHEDULE IN 2 WEEKS

## 2013-04-12 NOTE — Progress Notes (Signed)
HPI: FU MS and recent CVA. Patient has a history of rheumatic fever. Cardiac catheterization in [facility] in June of 2008 showed normal LV function, no coronary disease, moderate mitral stenosis with a valve area of 1.1 cm, and severe pulmonary hypertension. TEE in [facility] in 2008 showed normal LV function and moderate mitral stenosis with a valve area of 1.4 cm, mild MR. Patient had valvuloplasty at [facility] in 2008. Nuclear study in April of 2014 showed an ejection fraction of 56%. There was a fixed anterior defect consistent with soft tissue attenuation but no ischemia. Abdominal ultrasound in April of 2014 normal. Patient was admitted on November 28 with a CVA. Note white blood cell count was normal and she had no fevers. 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 Transesophageal echocardiogram on December 2 showed normal LV function. There was mild mitral stenosis and mild mitral regurgitation. The left atrium was dilated and there was spontaneous contrast in the left atrial appendage but no thrombus. There was mild spontaneous contrast in the right atrium. There was a small oscillating density on the anterior mitral valve leaflet of uncertain etiology and vegetation could not be excluded. There was a small oscillating density on the aortic valve felt most likely to be Lambl's excrescence. Sedimentation rate was checked yesterday and was 69. Patient did have an implantable loop placed prior to discharge. Note the patient did have a small abscess in the lateral axillary area develop approximately one and one half weeks ago. She was placed on doxycycline and is on day 9 today. Since discharge she denies any dyspnea. No chest pain. No syncope. No fevers or chills. She has not had recent dental work and had no fevers or chills in the past several months.   Current Outpatient Prescriptions  Medication Sig Dispense Refill  . amLODipine (NORVASC) 10 MG  tablet Take 1 tablet (10 mg total) by mouth daily.  30 tablet  5  . atorvastatin (LIPITOR) 80 MG tablet Take 1 tablet (80 mg total) by mouth daily at 6 PM.  30 tablet  0  . clopidogrel (PLAVIX) 75 MG tablet Take 1 tablet (75 mg total) by mouth daily with breakfast.  30 tablet  0  . doxycycline (VIBRA-TABS) 100 MG tablet Take 1 tablet (100 mg total) by mouth 2 (two) times daily.  20 tablet  0  . metoprolol succinate (TOPROL-XL) 25 MG 24 hr tablet Take 1 tablet (25 mg total) by mouth daily.  30 tablet  5   No current facility-administered medications for this visit.     Past Medical History  Diagnosis Date  . Hypertension   . Stroke   . History of rheumatic fever     as a child  . Hyperlipidemia   . Mitral stenosis   . OSA (obstructive sleep apnea)     mild per sleep study 2008  . Heart murmur   . Complication of anesthesia     difficult to wake up from anesthesia  . Pneumonia     double pneumonia once    Past Surgical History  Procedure Laterality Date  . Cardiac surgery  2008    "balloon surgery at [facility]"  . Abdominal hysterectomy    . Colonoscopy w/ polypectomy    . Appendectomy  1989    appendix ruptured,had peritonitis  . Tee without cardioversion N/A 04/10/2013    Procedure: TRANSESOPHAGEAL ECHOCARDIOGRAM (TEE);  Surgeon: [name], MD;  Location: [facility] ENDOSCOPY;  Service: Cardiovascular;  Laterality: N/A;  . Loop recorder implant  04-10-2013    MDT LinQ implanted by Dr Rayann Heman for cryptogenic stroke    History   Social History  . Marital Status: Married    Spouse Name: N/A    Number of Children: 2  . Years of Education: N/A   Occupational History  . Not on file.   Social History Main Topics  . Smoking status: Former Smoker -- 1.00 packs/day for 2 years  . Smokeless tobacco: Not on file  . Alcohol Use: No     Comment: drank years ago when young  . Drug Use: No  . Sexual Activity: Not on file   Other Topics Concern  . 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 Remaining systems are negative.  Physical Exam: Well-developed well-nourished in no acute distress.  Skin is warm and dry.  HEENT is normal.  Neck is supple.  Chest is clear to auscultation with normal expansion. Patient has a small fluctuant area and the lateral chest area that is improving by her report. Cardiovascular exam is regular rate and rhythm.  Abdominal exam nontender or distended. No masses palpated. Extremities show no edema. No peripheral stigmata of SBE. neuro residual weakness right upper extremity  ECG sinus rhythm with first degree AV block.

## 2013-04-12 NOTE — Assessment & Plan Note (Signed)
Continue present blood pressure medications. 

## 2013-04-12 NOTE — Assessment & Plan Note (Addendum)
Patient had a recent stroke. She is improving. Her transesophageal echocardiogram showed normal LV function and mild mitral stenosis/mitral insufficiency secondary to rheumatic disease. There was an oscillating density on the mitral valve and vegetation is a possibility. She also was noted to have spontaneous contrast in her left atrial appendage but no thrombus. She certainly could have had an embolic event from the density on the mitral valve but intermittent atrial fibrillation is also possible given her mitral stenosis and note of smoke in her left atrial appendage. Her implantable loop is in place. If atrial fibrillation is documented she would require long-term anticoagulation. Note her recent sedimentation rate was elevated. I also had blood cultures drawn yesterday to screen for endocarditis. However she had a recent small abscess in her left axillary area. She was placed on doxycycline 9 days ago for this and this is improving. I am concerned that this could possibly be a portal of entry for bacteria that could lead to endocarditis. However she has not had fevers and her white blood cell count was normal on recent admission. Her blood cultures may remain sterile given her recent doxycycline use. I discussed the patient with Dr. Drucilla Schmidt of infectious disease. He will see her tomorrow to assist with recommendations concerning whether she needs treatment for endocarditis. We will plan repeat echocardiogram in 2 weeks. Note she does have a first degree AV block on her electrocardiogram but there was no abscess noted at the time of her transesophageal echocardiogram and her first degree AV block was evident back in April of this year. Continue Plavix for recent stroke.

## 2013-04-13 ENCOUNTER — Encounter (HOSPITAL_COMMUNITY): Payer: Self-pay | Admitting: Cardiology

## 2013-04-13 ENCOUNTER — Inpatient Hospital Stay (HOSPITAL_COMMUNITY): Payer: BC Managed Care – PPO

## 2013-04-13 ENCOUNTER — Inpatient Hospital Stay (HOSPITAL_COMMUNITY)
Admission: AD | Admit: 2013-04-13 | Discharge: 2013-04-16 | DRG: 290 | Disposition: A | Discharge status: Home Health | Payer: BC Managed Care – PPO | Source: Ambulatory Visit | Attending: Internal Medicine | Admitting: Internal Medicine

## 2013-04-13 ENCOUNTER — Encounter: Payer: Self-pay | Admitting: Infectious Disease

## 2013-04-13 ENCOUNTER — Ambulatory Visit (INDEPENDENT_AMBULATORY_CARE_PROVIDER_SITE_OTHER): Payer: BC Managed Care – PPO | Admitting: Infectious Disease

## 2013-04-13 VITALS — BP 156/91 | HR 87 | Temp 98.4°F | Wt 186.0 lb

## 2013-04-13 DIAGNOSIS — Z7902 Long term (current) use of antithrombotics/antiplatelets: Secondary | ICD-10-CM

## 2013-04-13 DIAGNOSIS — I272 Pulmonary hypertension, unspecified: Secondary | ICD-10-CM

## 2013-04-13 DIAGNOSIS — R079 Chest pain, unspecified: Secondary | ICD-10-CM

## 2013-04-13 DIAGNOSIS — Z79899 Other long term (current) drug therapy: Secondary | ICD-10-CM

## 2013-04-13 DIAGNOSIS — I050 Rheumatic mitral stenosis: Secondary | ICD-10-CM

## 2013-04-13 DIAGNOSIS — Z113 Encounter for screening for infections with a predominantly sexual mode of transmission: Secondary | ICD-10-CM

## 2013-04-13 DIAGNOSIS — N61 Mastitis without abscess: Secondary | ICD-10-CM

## 2013-04-13 DIAGNOSIS — I38 Endocarditis, valve unspecified: Secondary | ICD-10-CM

## 2013-04-13 DIAGNOSIS — J209 Acute bronchitis, unspecified: Secondary | ICD-10-CM

## 2013-04-13 DIAGNOSIS — Z9889 Other specified postprocedural states: Secondary | ICD-10-CM

## 2013-04-13 DIAGNOSIS — G4733 Obstructive sleep apnea (adult) (pediatric): Secondary | ICD-10-CM | POA: Diagnosis present

## 2013-04-13 DIAGNOSIS — I4891 Unspecified atrial fibrillation: Secondary | ICD-10-CM | POA: Diagnosis present

## 2013-04-13 DIAGNOSIS — E785 Hyperlipidemia, unspecified: Secondary | ICD-10-CM

## 2013-04-13 DIAGNOSIS — Z0000 Encounter for general adult medical examination without abnormal findings: Secondary | ICD-10-CM

## 2013-04-13 DIAGNOSIS — Z8673 Personal history of transient ischemic attack (TIA), and cerebral infarction without residual deficits: Secondary | ICD-10-CM | POA: Diagnosis present

## 2013-04-13 DIAGNOSIS — I639 Cerebral infarction, unspecified: Secondary | ICD-10-CM

## 2013-04-13 DIAGNOSIS — I6350 Cerebral infarction due to unspecified occlusion or stenosis of unspecified cerebral artery: Secondary | ICD-10-CM

## 2013-04-13 DIAGNOSIS — I809 Phlebitis and thrombophlebitis of unspecified site: Secondary | ICD-10-CM | POA: Diagnosis not present

## 2013-04-13 DIAGNOSIS — L0291 Cutaneous abscess, unspecified: Secondary | ICD-10-CM

## 2013-04-13 DIAGNOSIS — I339 Acute and subacute endocarditis, unspecified: Secondary | ICD-10-CM

## 2013-04-13 DIAGNOSIS — B9562 Methicillin resistant Staphylococcus aureus infection as the cause of diseases classified elsewhere: Secondary | ICD-10-CM

## 2013-04-13 DIAGNOSIS — A4902 Methicillin resistant Staphylococcus aureus infection, unspecified site: Secondary | ICD-10-CM

## 2013-04-13 DIAGNOSIS — Z87891 Personal history of nicotine dependence: Secondary | ICD-10-CM

## 2013-04-13 DIAGNOSIS — I6340 Cerebral infarction due to embolism of unspecified cerebral artery: Secondary | ICD-10-CM

## 2013-04-13 DIAGNOSIS — I4430 Unspecified atrioventricular block: Secondary | ICD-10-CM

## 2013-04-13 DIAGNOSIS — I2789 Other specified pulmonary heart diseases: Secondary | ICD-10-CM | POA: Diagnosis present

## 2013-04-13 DIAGNOSIS — I440 Atrioventricular block, first degree: Secondary | ICD-10-CM | POA: Diagnosis present

## 2013-04-13 DIAGNOSIS — Z8679 Personal history of other diseases of the circulatory system: Secondary | ICD-10-CM

## 2013-04-13 DIAGNOSIS — I679 Cerebrovascular disease, unspecified: Secondary | ICD-10-CM

## 2013-04-13 DIAGNOSIS — I330 Acute and subacute infective endocarditis: Secondary | ICD-10-CM

## 2013-04-13 DIAGNOSIS — B360 Pityriasis versicolor: Secondary | ICD-10-CM

## 2013-04-13 DIAGNOSIS — N611 Abscess of the breast and nipple: Secondary | ICD-10-CM

## 2013-04-13 DIAGNOSIS — Z8619 Personal history of other infectious and parasitic diseases: Secondary | ICD-10-CM

## 2013-04-13 DIAGNOSIS — I10 Essential (primary) hypertension: Secondary | ICD-10-CM

## 2013-04-13 LAB — CBC
Hemoglobin: 14.6 g/dL (ref 12.0–15.0)
MCV: 85.9 fL (ref 78.0–100.0)
Platelets: 145 10*3/uL — ABNORMAL LOW (ref 150–400)
RBC: 5.33 MIL/uL — ABNORMAL HIGH (ref 3.87–5.11)
RDW: 14.6 % (ref 11.5–15.5)
WBC: 4.3 10*3/uL (ref 4.0–10.5)

## 2013-04-13 LAB — COMPREHENSIVE METABOLIC PANEL
ALT: 37 U/L — ABNORMAL HIGH (ref 0–35)
AST: 50 U/L — ABNORMAL HIGH (ref 0–37)
Albumin: 4.1 g/dL (ref 3.5–5.2)
CO2: 21 mEq/L (ref 19–32)
Calcium: 9.9 mg/dL (ref 8.4–10.5)
Chloride: 100 mEq/L (ref 96–112)
Creatinine, Ser: 0.96 mg/dL (ref 0.50–1.10)
GFR calc non Af Amer: 63 mL/min — ABNORMAL LOW (ref >90)
Glucose, Bld: 81 mg/dL (ref 70–99)
Sodium: 136 mEq/L (ref 135–145)
Total Bilirubin: 0.7 mg/dL (ref 0.3–1.2)

## 2013-04-13 MED ORDER — GUAIFENESIN-DM 100-10 MG/5ML PO SYRP: 5.0000 mL | ORAL_SOLUTION | ORAL | Status: DC | PRN

## 2013-04-13 MED ORDER — ALUM & MAG HYDROXIDE-SIMETH 200-200-20 MG/5ML PO SUSP: 30.0000 mL | Freq: Four times a day (QID) | ORAL | Status: DC | PRN

## 2013-04-13 MED ORDER — FLUCONAZOLE 100 MG PO TABS
100.0000 mg | ORAL_TABLET | Freq: Every day | ORAL | Status: DC
  Administered 2013-04-14 – 2013-04-16 (×3): 100 mg via ORAL
  Filled 2013-04-13 (×3): qty 1

## 2013-04-13 MED ORDER — CLOPIDOGREL BISULFATE 75 MG PO TABS
75.0000 mg | ORAL_TABLET | Freq: Every day | ORAL | Status: DC
  Administered 2013-04-14 – 2013-04-16 (×3): 75 mg via ORAL
  Filled 2013-04-13 (×5): qty 1

## 2013-04-13 MED ORDER — ONDANSETRON HCL 4 MG/2ML IJ SOLN: 4.0000 mg | Freq: Four times a day (QID) | INTRAMUSCULAR | Status: DC | PRN

## 2013-04-13 MED ORDER — ONDANSETRON HCL 4 MG PO TABS: 4.0000 mg | ORAL_TABLET | Freq: Four times a day (QID) | ORAL | Status: DC | PRN

## 2013-04-13 MED ORDER — DEXTROSE 5 % IV SOLN
2.0000 g | Freq: Two times a day (BID) | INTRAVENOUS | Status: DC
  Administered 2013-04-13 – 2013-04-16 (×7): 2 g via INTRAVENOUS
  Filled 2013-04-13 (×12): qty 2

## 2013-04-13 MED ORDER — ATORVASTATIN CALCIUM 80 MG PO TABS
80.0000 mg | ORAL_TABLET | Freq: Every day | ORAL | Status: DC
  Administered 2013-04-13 – 2013-04-15 (×3): 80 mg via ORAL
  Filled 2013-04-13 (×4): qty 1

## 2013-04-13 MED ORDER — ALBUTEROL SULFATE (5 MG/ML) 0.5% IN NEBU: 2.5000 mg | INHALATION_SOLUTION | RESPIRATORY_TRACT | Status: DC | PRN

## 2013-04-13 MED ORDER — POLYETHYLENE GLYCOL 3350 17 G PO PACK
17.0000 g | PACK | Freq: Every day | ORAL | Status: DC | PRN
  Filled 2013-04-13: qty 1

## 2013-04-13 MED ORDER — VANCOMYCIN HCL IN DEXTROSE 1-5 GM/200ML-% IV SOLN
1000.0000 mg | Freq: Two times a day (BID) | INTRAVENOUS | Status: DC
  Administered 2013-04-13 – 2013-04-16 (×6): 1000 mg via INTRAVENOUS
  Filled 2013-04-13 (×8): qty 200

## 2013-04-13 MED ORDER — AMLODIPINE BESYLATE 10 MG PO TABS
10.0000 mg | ORAL_TABLET | Freq: Every day | ORAL | Status: DC
  Administered 2013-04-14 – 2013-04-16 (×3): 10 mg via ORAL
  Filled 2013-04-13 (×4): qty 1

## 2013-04-13 MED ORDER — METOPROLOL SUCCINATE ER 25 MG PO TB24
25.0000 mg | ORAL_TABLET | Freq: Every day | ORAL | Status: DC
  Administered 2013-04-14 – 2013-04-16 (×3): 25 mg via ORAL
  Filled 2013-04-13 (×4): qty 1

## 2013-04-13 MED ORDER — HEPARIN SODIUM (PORCINE) 5000 UNIT/ML IJ SOLN
5000.0000 [IU] | Freq: Three times a day (TID) | INTRAMUSCULAR | Status: DC
  Administered 2013-04-13 – 2013-04-16 (×8): 5000 [IU] via SUBCUTANEOUS
  Filled 2013-04-13 (×12): qty 1

## 2013-04-13 MED ORDER — HYDROCODONE-ACETAMINOPHEN 5-325 MG PO TABS: 1.0000 | ORAL_TABLET | ORAL | Status: DC | PRN

## 2013-04-13 NOTE — Consult Note (Signed)
PHARMACY CONSULT NOTE - INITIAL  Pharmacy Consult for :   Vancomycin Indication:  R/O infectious endocarditis vs nonbacterial thrombotic endocarditis  Hospital Problems: Principal Problem:   Acute endocarditis, unspecified Active Problems:   HTN (hypertension)   History of rheumatic fever   Mitral stenosis   Stroke   S/P aortic valvotomy 2008   Moderate to severe pulmonary hypertension   Hyperlipidemia   Cerebrovascular disease   Endocarditis   Allergies: No Known Allergies  Patient Measurements: Height: 5\' 2"  (157.5 cm) Weight: 183 lb 3.2 oz (83.099 kg) IBW/kg (Calculated) : 50.1  Vital Signs: BP 144/77  Pulse 89  Temp(Src) 98.1 F (36.7 C) (Oral)  Resp 18  Ht 5\' 2"  (1.575 m)  Wt 183 lb 3.2 oz (83.099 kg)  BMI 33.50 kg/m2  SpO2 99%  Labs:  Estimated Creatinine Clearance: 73.8 ml/min (by C-G formula based on Cr of 0.74).   Microbiology: Recent Results (from the past 720 hour(s))  MRSA PCR SCREENING     Status: None   Collection Time    04/06/13  5:08 PM      Result Value Range Status   MRSA by PCR NEGATIVE  NEGATIVE Final  CULTURE, BLOOD (SINGLE)     Status: None   Collection Time    04/11/13  2:25 PM      Result Value Range Status   Preliminary Report Blood Culture received; No Growth to date;   Preliminary   Preliminary Report Culture will be held for 5 days before issuing   Preliminary   Preliminary Report a Final Negative report.   Preliminary  CULTURE, BLOOD (SINGLE)     Status: None   Collection Time    04/11/13  2:25 PM      Result Value Range Status   Preliminary Report Blood Culture received; No Growth to date;   Preliminary   Preliminary Report Culture will be held for 5 days before issuing   Preliminary   Preliminary Report a Final Negative report.   Preliminary    Medical/Surgical History: Past Medical History  Diagnosis Date  . Hypertension   . Stroke   . History of rheumatic fever     as a child  . Hyperlipidemia   . Mitral  stenosis   . OSA (obstructive sleep apnea)     mild per sleep study 2008  . Heart murmur   . Complication of anesthesia     difficult to wake up from anesthesia  . Pneumonia     double pneumonia once   Past Surgical History  Procedure Laterality Date  . Cardiac surgery  2008    "balloon surgery at [facility]"  . Abdominal hysterectomy    . Colonoscopy w/ polypectomy    . Appendectomy  1989    appendix ruptured,had peritonitis  . Tee without cardioversion N/A 04/10/2013    Procedure: TRANSESOPHAGEAL ECHOCARDIOGRAM (TEE);  Surgeon: [name], MD;  Location: [facility] ENDOSCOPY;  Service: Cardiovascular;  Laterality: N/A;  . Loop recorder implant  04-10-2013    MDT LinQ implanted by Dr Rayann Heman for cryptogenic stroke    Medications:  Medication Sig  . amLODipine (NORVASC) 10 MG tablet Take 1 tablet (10 mg total) by mouth daily.  [name] Kitchen atorvastatin (LIPITOR) 80 MG tablet Take 1 tablet (80 mg total) by mouth daily at 6 PM.  . clopidogrel (PLAVIX) 75 MG tablet Take 1 tablet (75 mg total) by mouth daily with breakfast.  . metoprolol succinate (TOPROL-XL) 25 MG 24 hr tablet Take 1 tablet (  25 mg total) by mouth daily.  [name] Kitchen DOXYCYCLINE 100 MG tablet Take 1 tablet (100 mg total) by mouth 2 (two) times daily.   Scheduled:  . amLODipine  10 mg Oral Daily  . atorvastatin  80 mg Oral q1800  . cefTRIAXone (ROCEPHIN)  IV  2 g Intravenous Q12H  . clopidogrel  75 mg Oral Q breakfast  . heparin  5,000 Units Subcutaneous Q8H  . metoprolol succinate  25 mg Oral Daily   Anti-infectives   Start     Dose/Rate Route Frequency Ordered Stop   04/13/13 1445  cefTRIAXone (ROCEPHIN) 2 g in dextrose 5 % 50 mL IVPB     2 g 100 mL/hr over 30 Minutes Intravenous Every 12 hours 04/13/13 1431        Assessment:  62 year old recently admitted to [facility] with CVA.  During hospitalization a TEE workup showed mild mitral stenosis/ regurgitation that appears c/w a vegetation in the mitral valve    Patient is admitted for Abx tx  for presumed infectious endocarditis.  Goal of Therapy:   Vancomycin trough level 15-20 mcg/ml Antibiotics selected for infection/cultures and adjusted for renal function.   Plan:  1. Begin Vancomycin 1 gm IV q 12 hours.  Continue Ceftriaxone as ordered. Follow up SCr, UOP, cultures, clinical course and adjust as clinically indicated.   Vancomycin as clinically indicated.  Rakwon Letourneau, Craig Guess,  Pharm.D.,    12/5/20143:06 PM

## 2013-04-13 NOTE — Progress Notes (Signed)
Subjective:    Patient ID: Marie Malone, female    DOB: February 17, 1951, 62 y.o.   MRN: [id number]  HPI  62 year old recently admitted to [facility] with CVA. She was found to have a Moderate-sized acute posterior left MCA infarct. Edema without  significant mass effect. No associated hemorrhage MRA showed an Occluded left MCA posterior M2 branch about 10 mm beyond its origin.  During the hospitalization she also had workup with TEE which showed :  mild mitral stenosis and mild mitral regurgitation. The left atrium was dilated and there was spontaneous contrast in the left atrial appendage but no thrombus. There was mild spontaneous contrast in the right atrium. There was a small oscillating density on the anterior mitral valve leaflet of uncertain etiology and vegetation could not be excluded. There was a small oscillating density on the aortic valve felt most likely to be Lambl's excrescence  Patient had a set of blood cultures drawn and was dc to home. [name] called me yestereday due to his concerns re the area that appeared c/w a vegetation in the mitral valve.   He noted that pt had had a recent course of doxycycline begun that was finished yesterday and which likely will obfuscate the blood cultures.  Pt has had a loop recorder placed yesterday to look for arrythmias including atrial fibrillation.     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  I arranged to see the pt in my office in consultation re the possibility of infectious vs non infectious endocarditis.  Note [name] did place an implantable loop recorder to look for atrial fibrillation and/or other arrhythmias  And labs have also been recently pertinent for elevated sedimentation rate. She has not been known to have a fever but she has not been checking her temperatures at home.  She has no history of having had a hypercoagulable state no history of any  miscarriages she has had no DVTs or pulmonary embolism. She has no family history for connective tissue disease.  She did have the colonoscopy although I believe was more than 10 years ago done in [facility] with a polypectomy. She has a few false teeth but not had any recent dental work.  [place]. There are no farm animals nearby she has a small young cat but has not been around the birth of this catheter any other animals there are no pets birds or recently deceased pet birds.  I thoroughly reviewed her history and her past medical history surgical social history family history and her clinical picture. I do not see away around not treating her for infectious endocarditis see below.  Review of Systems  Constitutional: Negative for fever, chills, diaphoresis, activity change, appetite change, fatigue and unexpected weight change.  HENT: Negative for congestion, dental problem, rhinorrhea, sinus pressure, sneezing, sore throat and trouble swallowing.   Eyes: Negative for photophobia and visual disturbance.  Respiratory: Negative for cough, chest tightness, shortness of breath, wheezing and stridor.   Cardiovascular: Negative for chest pain, palpitations and leg swelling.  Gastrointestinal: Negative for nausea, vomiting, abdominal pain, diarrhea, constipation, blood in stool, abdominal distention and anal bleeding.  Genitourinary: Negative for dysuria, hematuria, flank pain and difficulty urinating.  Musculoskeletal: Negative for arthralgias, back pain, gait problem, joint swelling and myalgias.  Skin: Positive for wound. Negative for color change, pallor  and rash.  Neurological: Positive for facial asymmetry, speech difficulty and weakness. Negative for dizziness, tremors and light-headedness.  Hematological: Negative for adenopathy. Does not bruise/bleed easily.  Psychiatric/Behavioral: Negative for behavioral problems, confusion, sleep  disturbance, dysphoric mood, decreased concentration and agitation.       Objective:   Physical Exam  Constitutional: She is oriented to person, place, and time. She appears well-developed and well-nourished. No distress.  HENT:  Head: Normocephalic and atraumatic.  Mouth/Throat: Oropharynx is clear and moist.    Eyes: Conjunctivae and EOM are normal. No scleral icterus.  Neck: Normal range of motion. Neck supple. No JVD present.  Cardiovascular: Normal rate, regular rhythm and normal heart sounds.  Exam reveals no gallop and no friction rub.   No murmur heard. Pulmonary/Chest: Effort normal and breath sounds normal. No respiratory distress. She has no wheezes. She has no rales. She exhibits no tenderness.  Abdominal: She exhibits no distension. There is no tenderness. There is no rebound and no guarding.  Musculoskeletal: She exhibits no edema and no tenderness.  Lymphadenopathy:    She has no cervical adenopathy.  Neurological: She is alert and oriented to person, place, and time. No cranial nerve deficit. She exhibits normal muscle tone. Coordination normal.  Her strength on right UE is less vigorous than left but still 5 out of 5  Skin: Skin is warm and dry. She is not diaphoretic. No erythema. No pallor.     She has a few areas of linear hyperpigmentation on 2 of her toenails that are of uncertain acuity. See picture  Her hands had no splinter hemorrhages Janeway lesions or Osler nodes.  Psychiatric: She has a normal mood and affect. Her behavior is normal. Judgment and thought content normal.              Assessment & Plan:   #1 Rule out Mitral valve endocarditis vs nonbacterial thrombotic endocarditis. I do NOT see away to quickly making one diagnosis or the other truly in light of her ten-day course of doxycycline.  Would feel uncomfortable however not treating her for infectious endocarditis especially if it is possible that her stroke was a sequela of an  embolization from her heart valve.  --I. am arranging for [facility] have talked to Dr. Barton Dubois.  --We'll obtain another set of blood cultures today and start the patient on Bactrim I said dosed by pharmacy for at goal trough of 15-20 along with  --Ceftriaxone 2 g IV daily --we will wait for at least 48 to 72 hours of negative blood cultures prior to placing PICC line and arranging for home abx  --obvious if cultures yield organism we can narrow on this  --I would also get panorex of teeth  --I. am not anxious at this point is to gentamicin though one could consider that.  --WE will also certainly consider NBTE and the location is the correct one for such pathology but there is nothing else in history to suggest that she has a colon cancer for ex to produce a hypercoagulable state or an antiphospholipid ab syndrome  --We will send and antiphospholipid , anticardiolipin antibodies and DIC panel   -- we will also send off coxiella, bartonella abs and chlamydia abs  I spent greater than 60 minutes with the patient including greater than 50% of time in face to face counsel of the patient and in coordination of their care.  #2 ? Atrial fibrillation:  Pt with implantable loop  recorder to look for this: My understanding is that this device is simply in the sub q tissue and muscle rather than in endovascular space  We will also have pt on tele with admission  #3 MRSA abscess: presumptive and highly likely and she had prior lesion on her chin, this is to be resolving.  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 Johnnye Sima will pick back up on Monday

## 2013-04-13 NOTE — H&P (Signed)
[facility]                                                                                    Patient Demographics  Marie Malone, is a 62 y.o. female  MRN: [id number]   DOB - February 01, 1951  Admit Date - 04/13/2013  Outpatient Primary MD for the patient is [name], NP   With History of -  Past Medical History  Diagnosis Date  . Hypertension   . Stroke   . History of rheumatic fever     as a child  . Hyperlipidemia   . Mitral stenosis   . OSA (obstructive sleep apnea)     mild per sleep study 2008  . Heart murmur   . Complication of anesthesia     difficult to wake up from anesthesia  . Pneumonia     double pneumonia once      Past Surgical History  Procedure Laterality Date  . Cardiac surgery  2008    "balloon surgery at [facility]"  . Abdominal hysterectomy    . Colonoscopy w/ polypectomy    . Appendectomy  1989    appendix ruptured,had peritonitis  . Tee without cardioversion N/A 04/10/2013    Procedure: TRANSESOPHAGEAL ECHOCARDIOGRAM (TEE);  Surgeon: [name], MD;  Location: [facility] ENDOSCOPY;  Service: Cardiovascular;  Laterality: N/A;  . Loop recorder implant  04-10-2013    MDT LinQ implanted by Dr Rayann Heman for cryptogenic stroke    in for   No chief complaint on file.    HPI  Marie Malone  is a 62 y.o. female, with known history of hypertension, prior stroke 2004 [facility], rheumatic fever as a child resulting in valvuloplasty [facility] 2008, mild obstructive sleep apnea per sleep study 2008 with severe pulmonary hypertension, moderate mitral stenosis [valve area 1.1 centimeters squared], low-risk nuclear study 08/27/12, who was last seen normal about 10 PM last night 11/27. She awoke in the morning of 11/28 and noticed that her right arm was weak and had a fall in the bathroom, she was diagnosed with left MCA territory embolic infarct, she was placed on antiplatelet therapy, of note patient has had a left breast abscess which has self drained  and is on oral doxycycline for it for the last several days. This abscess started prior to her CVA.   During her CVA workup patient had a TEE and TEE after her discharge her TEE results updated as being positive for a possible vegetation on the mitral valve, she was then seen by infectious disease physician Dr. Drucilla Schmidt on 04/13/2013 and referred to the hospital for admission, blood cultures, possible PICC line placement and prolonged IV antibiotics. Patient herself at this time denies any fever chills. Denies any new deficits, no headache, no chest pain, no problems with vision or hearing, no tender sores or boils on her body. Her left breast abscess is almost completely healed. Her right-sided deficit from previous CVA has almost completely resolved and she is almost regained complete strength has some tingling and numbness in the right upper extremity and some loss of sensation to fine touch.     Review of Systems  In addition to the HPI above, No Fever-chills, No Headache, No changes with Vision or hearing, No problems swallowing food or Liquids, No Chest pain, Cough or Shortness of Breath, No Abdominal pain, No Nausea or Vommitting, Bowel movements are regular, No Blood in stool or Urine, No dysuria, No new skin rashes or bruises, No new joints pains-aches,  No new weakness, tingling, numbness in any extremity, No recent weight gain or loss, No polyuria, polydypsia or polyphagia, No significant Mental Stressors.  A full 10 point Review of Systems was done, except as stated above, all other Review of Systems were negative.   Social History History  Substance Use Topics  . Smoking status: Former Smoker -- 1.00 packs/day for 2 years  . Smokeless tobacco: Not on file  . Alcohol Use: No     Comment: drank years ago when young      Family History Family History  Problem Relation Age of Onset  . Diabetes Mother   . Stroke Mother   . Diabetes Father   . Heart disease Father      CHF  . Cancer Father     kidney  . Diabetes Sister   . Diabetes Brother   . Hypertension Daughter       Prior to Admission medications   Medication Sig Start Date End Date Taking? Authorizing Provider  amLODipine (NORVASC) 10 MG tablet Take 1 tablet (10 mg total) by mouth daily. 10/25/12   Debbrah Alar, NP  atorvastatin (LIPITOR) 80 MG tablet Take 1 tablet (80 mg total) by mouth daily at 6 PM. 04/09/13   Nita Sells, MD  clopidogrel (PLAVIX) 75 MG tablet Take 1 tablet (75 mg total) by mouth daily with breakfast. 04/09/13   Nita Sells, MD  doxycycline (VIBRA-TABS) 100 MG tablet Take 1 tablet (100 mg total) by mouth 2 (two) times daily. 04/02/13   Debbrah Alar, NP  metoprolol succinate (TOPROL-XL) 25 MG 24 hr tablet Take 1 tablet (25 mg total) by mouth daily. 10/25/12   Debbrah Alar, NP    No Known Allergies  Physical Exam  Vitals  Blood pressure 144/77, pulse 89, temperature 98.1 F (36.7 C), temperature source Oral, resp. rate 18, height 5\' 2"  (1.575 m), weight 83.099 kg (183 lb 3.2 oz), SpO2 99.00%.   1. General elderly African American female lying in bed in NAD,    2. Normal affect and insight, Not Suicidal or Homicidal, Awake Alert, Oriented X 3.  3. No F.N deficits, ALL C.Nerves Intact, Strength 5/5 all 4 extremities, Sensation intact all 4 extremities, Plantars down going.  4. Ears and Eyes appear Normal, Conjunctivae clear, PERRLA. Moist Oral Mucosa.  5. Supple Neck, No JVD, No cervical lymphadenopathy appriciated, No Carotid Bruits.  6. Symmetrical Chest wall movement, Good air movement bilaterally, CTAB. Left breast under her axilla has a small open wound, no remaining fluctuance. No surrounding cellulitis. No warmth.  7. RRR, No Gallops, Rubs or Murmurs, No Parasternal Heave.  8. Positive Bowel Sounds, Abdomen Soft, Non tender, No organomegaly appriciated,No rebound -guarding or rigidity.  9.  No Cyanosis, Normal Skin Turgor, No  Skin Rash or Bruise.  10. Good muscle tone,  joints appear normal , no effusions, Normal ROM.  11. No Palpable Lymph Nodes in Neck or Axillae     Data Review  CBC  Recent Labs Lab 04/06/13 2033  WBC 5.1  HGB 13.0  HCT 39.7  PLT 156  MCV 84.3  MCH 27.6  MCHC 32.7  RDW 14.3   ------------------------------------------------------------------------------------------------------------------  Chemistries   Recent Labs Lab 04/06/13 2033 04/07/13 0920 04/08/13 0630  NA  --  139 139  K  --  3.1* 3.5  CL  --  101 103  CO2  --  23 21  GLUCOSE  --  116* 95  BUN  --  10 14  CREATININE 0.59 0.65 0.74  CALCIUM  --  9.2 9.1   ------------------------------------------------------------------------------------------------------------------ estimated creatinine clearance is 73.8 ml/min (by C-G formula based on Cr of 0.74). ------------------------------------------------------------------------------------------------------------------ No results found for this basename: TSH, T4TOTAL, FREET3, T3FREE, THYROIDAB,  in the last 72 hours   Coagulation profile No results found for this basename: INR, PROTIME,  in the last 168 hours ------------------------------------------------------------------------------------------------------------------- No results found for this basename: DDIMER,  in the last 72 hours -------------------------------------------------------------------------------------------------------------------  Cardiac Enzymes No results found for this basename: CK, CKMB, TROPONINI, MYOGLOBIN,  in the last 168 hours ------------------------------------------------------------------------------------------------------------------ No components found with this basename: POCBNP,    ---------------------------------------------------------------------------------------------------------------  Urinalysis    Component Value Date/Time   COLORURINE YELLOW 04/06/2013  [place] 04/06/2013 1520   LABSPEC 1.013 04/06/2013 1520   PHURINE 6.5 04/06/2013 [employer] 04/06/2013 [place] 04/06/2013 [place] 04/06/2013 [place] 04/06/2013 [place] 04/06/2013 1520   UROBILINOGEN 1.0 04/06/2013 1520   NITRITE NEGATIVE 04/06/2013 [place] 04/06/2013 1520    ----------------------------------------------------------------------------------------------------------------  Imaging results:   Ct Head Wo Contrast  04/06/2013   CLINICAL DATA:  Right-sided weakness and slurred speech  EXAM: CT HEAD WITHOUT CONTRAST  TECHNIQUE: Contiguous axial images were obtained from the base of the skull through the vertex without intravenous contrast.  COMPARISON:  09/25/2006  FINDINGS: The bony calvarium is intact. No gross soft tissue abnormality is noted. There is a geographic area identified in the left parietal lobe which roughly measures 3.9 x 3.4 cm in greatest dimension consistent with an evolving infarct. No significant mass effect is noted. No significant midline shift is seen. No acute hemorrhage or space-occupying mass lesion is noted.  IMPRESSION: Evolving infarct in the distribution of the left middle cerebral artery in the left parietal lobe. No associated hemorrhage is noted. No other focal abnormality is noted.  These results were called by telephone at the time of interpretation on 04/06/2013 at 1:58 PM to Dr. Wendi Snipes , who verbally acknowledged these results.   Electronically Signed   By: Inez Catalina M.D.   On: 04/06/2013 14:00   Mri Brain Without Contrast  04/06/2013   CLINICAL DATA:  62 year old female with acute onset right side weakness and slurred speech. Left MCA infarcts suspected on CT. Initial encounter. History of remote infarcts.  EXAM: MRI HEAD WITHOUT CONTRAST  MRA HEAD WITHOUT CONTRAST  TECHNIQUE: Multiplanar, multiecho pulse sequences of  the brain and surrounding structures were obtained without intravenous contrast. Angiographic images of the head were obtained using MRA technique without contrast.  COMPARISON:  Head CTs 04/06/2013 and earlier.  FINDINGS: MRI HEAD FINDINGS  Moderate size confluent restricted diffusion in the posterior left MCA territory corresponding to the earlier CT finding. The area of diffusion abnormality encompasses 5-6 cm. There is T2 an less pronounced FLAIR hyperintensity an gyral edema. There is an associated conspicuous focus of increased FLAIR signal within a left posterior MCA branch on series 7, image 8. Downstream branches also show asymmetric increased FLAIR signal (images 9 and 10). MRA findings are below.  No acute intracranial  hemorrhage identified. No right hemisphere or posterior fossa restricted diffusion. Other Major intracranial vascular flow voids are preserved.  Small chronic left cerebellar lacunar infarct. Small bilateral chronic basal ganglia lacunar infarcts versus perivascular spaces. There is also a small focus of cortical encephalomalacia in the right sylvian fissure on series 7, image 10. Otherwise minimal superimposed nonspecific right hemisphere periventricular white matter T2 and FLAIR hyperintensity.  No midline shift, mass effect, evidence of mass lesion, ventriculomegaly, extra-axial collection or acute intracranial hemorrhage. Cervicomedullary junction and pituitary are within normal limits. Negative visualized cervical spine. Visualized bone marrow signal is within normal limits. Visualized orbit soft tissues are within normal limits. Minor paranasal sinus mucosal thickening. Mild left mastoid fluid. Negative nasopharynx. Visualized scalp soft tissues are within normal limits.  MRA HEAD FINDINGS  Antegrade flow in the posterior circulation. Dominant distal right vertebral artery. Normal PICA origins. Patent vertebrobasilar junction. No basilar stenosis. SCA and PCA origins are normal.  Posterior communicating arteries are diminutive or absent. Bilateral PCA branches are within normal limits.  Antegrade flow in both ICA siphons. No ICA siphon stenosis. Patent carotid termini. Normal visualized bilateral ACA branches. The anterior communicating artery is diminutive or absent. Visualized right MCA branches are within normal limits.  Normal left MCA origin. The left M1 segment is patent. The dominant left MCA posterior sylvian branch is occluded about 10 mm beyond its origin (series 5, images 69 -73). This corresponds to the level of the abnormal FLAIR signal described above. The left MCA anterior division branches appear within normal limits.  IMPRESSION: 1. Moderate-sized acute posterior left MCA infarct. Edema without significant mass effect. No associated hemorrhage at this time. 2. Underlying small areas of chronic small and medium-sized vessel ischemia. 3. Occluded left MCA posterior M2 branch about 10 mm beyond its origin. 4. Negative more proximal anterior circulation and otherwise negative intracranial MRA.   Electronically Signed   By: Lars Pinks M.D.   On: 04/06/2013 20:38   Mr Jodene Nam Head/brain Wo Cm  04/06/2013   CLINICAL DATA:  62 year old female with acute onset right side weakness and slurred speech. Left MCA infarcts suspected on CT. Initial encounter. History of remote infarcts.  EXAM: MRI HEAD WITHOUT CONTRAST  MRA HEAD WITHOUT CONTRAST  TECHNIQUE: Multiplanar, multiecho pulse sequences of the brain and surrounding structures were obtained without intravenous contrast. Angiographic images of the head were obtained using MRA technique without contrast.  COMPARISON:  Head CTs 04/06/2013 and earlier.  FINDINGS: MRI HEAD FINDINGS  Moderate size confluent restricted diffusion in the posterior left MCA territory corresponding to the earlier CT finding. The area of diffusion abnormality encompasses 5-6 cm. There is T2 an less pronounced FLAIR hyperintensity an gyral edema. There is an  associated conspicuous focus of increased FLAIR signal within a left posterior MCA branch on series 7, image 8. Downstream branches also show asymmetric increased FLAIR signal (images 9 and 10). MRA findings are below.  No acute intracranial hemorrhage identified. No right hemisphere or posterior fossa restricted diffusion. Other Major intracranial vascular flow voids are preserved.  Small chronic left cerebellar lacunar infarct. Small bilateral chronic basal ganglia lacunar infarcts versus perivascular spaces. There is also a small focus of cortical encephalomalacia in the right sylvian fissure on series 7, image 10. Otherwise minimal superimposed nonspecific right hemisphere periventricular white matter T2 and FLAIR hyperintensity.  No midline shift, mass effect, evidence of mass lesion, ventriculomegaly, extra-axial collection or acute intracranial hemorrhage. Cervicomedullary junction and pituitary are within normal limits. Negative visualized cervical  spine. Visualized bone marrow signal is within normal limits. Visualized orbit soft tissues are within normal limits. Minor paranasal sinus mucosal thickening. Mild left mastoid fluid. Negative nasopharynx. Visualized scalp soft tissues are within normal limits.  MRA HEAD FINDINGS  Antegrade flow in the posterior circulation. Dominant distal right vertebral artery. Normal PICA origins. Patent vertebrobasilar junction. No basilar stenosis. SCA and PCA origins are normal. Posterior communicating arteries are diminutive or absent. Bilateral PCA branches are within normal limits.  Antegrade flow in both ICA siphons. No ICA siphon stenosis. Patent carotid termini. Normal visualized bilateral ACA branches. The anterior communicating artery is diminutive or absent. Visualized right MCA branches are within normal limits.  Normal left MCA origin. The left M1 segment is patent. The dominant left MCA posterior sylvian branch is occluded about 10 mm beyond its origin (series  5, images 69 -73). This corresponds to the level of the abnormal FLAIR signal described above. The left MCA anterior division branches appear within normal limits.  IMPRESSION: 1. Moderate-sized acute posterior left MCA infarct. Edema without significant mass effect. No associated hemorrhage at this time. 2. Underlying small areas of chronic small and medium-sized vessel ischemia. 3. Occluded left MCA posterior M2 branch about 10 mm beyond its origin. 4. Negative more proximal anterior circulation and otherwise negative intracranial MRA.   Electronically Signed   By: Lars Pinks M.D.   On: 04/06/2013 20:38         Assessment & Plan    1. 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 Drucilla Schmidt in his office, plan is to obtain 2 sets of blood cultures, once cultures obtained pharmacy will be requested to start dosing IV vancomycin and Rocephin, preferred dose of Rocephin will be 2 g twice a day per ID. If blood cultures remain negative for 72 hours we will proceed with PICC line for prolonged IV antibiotics to home health.   2. Recent left MCA embolic stroke. Continue antiplatelet treatment as above. Her deficits have almost completely resolved. Lasix and statin will be continued for secondary prevention.    3. Hypertension stable. Continue comminution of beta blocker and Norvasc.    4. Moderate to severe pulmonary hypertension. She's not short of breath, has no pedal edema, outpatient followup with pulmonary post discharge.     5. Recent left breast abscess. Site looks almost completely healed, it drained by itself, she would has completed 10 days of oral doxycycline. Will obtain an ultrasound of the left breast, for now vancomycin and Rocephin should suffice.    6. Recent implantable loop recorder  to look for paroxysmal atrial fibrillation, first degree AV block. Stable patient follows with [facility] in the outpatient setting.     DVT Prophylaxis Heparin    AM Labs Ordered, also please review Full Orders  Family Communication: Admission, patients condition and plan of care including tests being ordered have been discussed with the patient and family who indicate understanding and agree with the plan and Code Status.  Code Status Full  Likely DC to  Home  Condition GUARDED   Time spent in minutes : 35    SINGH,PRASHANT K M.D on 04/13/2013 at 1:26 PM  Between 7am to 7pm - Pager - [phone]  After 7pm go to [email or web address] - password [email or web address]  And look for the night coverage  person covering me after hours  [facility] Office  [phone]

## 2013-04-14 ENCOUNTER — Inpatient Hospital Stay (HOSPITAL_COMMUNITY): Payer: BC Managed Care – PPO

## 2013-04-14 DIAGNOSIS — I330 Acute and subacute infective endocarditis: Principal | ICD-10-CM

## 2013-04-14 LAB — HEPATITIS PANEL, ACUTE
HCV Ab: NEGATIVE
Hepatitis B Surface Ag: NEGATIVE

## 2013-04-14 LAB — BASIC METABOLIC PANEL
BUN: 22 mg/dL (ref 6–23)
Chloride: 102 mEq/L (ref 96–112)
Creatinine, Ser: 0.85 mg/dL (ref 0.50–1.10)
GFR calc Af Amer: 84 mL/min — ABNORMAL LOW (ref >90)
GFR calc non Af Amer: 72 mL/min — ABNORMAL LOW (ref >90)
Potassium: 3.8 mEq/L (ref 3.5–5.1)
Sodium: 137 mEq/L (ref 135–145)

## 2013-04-14 LAB — DIC (DISSEMINATED INTRAVASCULAR COAGULATION)PANEL
D-Dimer, Quant: 0.86 ug/mL-FEU — ABNORMAL HIGH (ref 0.00–0.48)
Platelets: 148 10*3/uL — ABNORMAL LOW (ref 150–400)
Smear Review: NONE SEEN

## 2013-04-14 LAB — CBC
HCT: 41.6 % (ref 36.0–46.0)
Hemoglobin: 13.8 g/dL (ref 12.0–15.0)
MCH: 27.9 pg (ref 26.0–34.0)
MCHC: 33.2 g/dL (ref 30.0–36.0)
RDW: 14.4 % (ref 11.5–15.5)

## 2013-04-14 NOTE — Progress Notes (Signed)
[facility] for Infectious Disease  Day # 2 rocephin and vancomycin   Subjective: No new complaints   Antibiotics:  Anti-infectives   Start     Dose/Rate Route Frequency Ordered Stop   04/14/13 1000  fluconazole (DIFLUCAN) tablet 100 mg     100 mg Oral Daily 04/13/13 2219     04/13/13 1545  cefTRIAXone (ROCEPHIN) 2 g in dextrose 5 % 50 mL IVPB     2 g 100 mL/hr over 30 Minutes Intravenous Every 12 hours 04/13/13 1431     04/13/13 1530  vancomycin (VANCOCIN) IVPB 1000 mg/200 mL premix     1,000 mg 200 mL/hr over 60 Minutes Intravenous Every 12 hours 04/13/13 1514        Medications: Scheduled Meds: . amLODipine  10 mg Oral Daily  . atorvastatin  80 mg Oral q1800  . cefTRIAXone (ROCEPHIN)  IV  2 g Intravenous Q12H  . clopidogrel  75 mg Oral Q breakfast  . fluconazole  100 mg Oral Daily  . heparin  5,000 Units Subcutaneous Q8H  . metoprolol succinate  25 mg Oral Daily  . vancomycin  1,000 mg Intravenous Q12H   Continuous Infusions:  PRN Meds:.albuterol, alum & mag hydroxide-simeth, guaiFENesin-dextromethorphan, HYDROcodone-acetaminophen, ondansetron (ZOFRAN) IV, ondansetron, polyethylene glycol   Objective: Weight change:   Intake/Output Summary (Last 24 hours) at 04/14/13 1406 Last data filed at 04/14/13 0700  Gross per 24 hour  Intake    970 ml  Output    300 ml  Net    670 ml   Blood pressure 144/71, pulse 77, temperature 98.2 F (36.8 C), temperature source Oral, resp. rate 17, height 5\' 2"  (1.575 m), weight 184 lb (83.462 kg), SpO2 98.00%. Temp:  [98.2 F (36.8 C)-98.3 F (36.8 C)] 98.2 F (36.8 C) (12/06 0406) Pulse Rate:  [76-77] 77 (12/06 0406) Resp:  [17-18] 17 (12/06 0406) BP: (144-150)/(63-71) 144/71 mmHg (12/06 0406) SpO2:  [98 %-100 %] 98 % (12/06 0406) Weight:  [184 lb (83.462 kg)] 184 lb (83.462 kg) (12/06 0406)  Physical Exam: General: Alert and awake, oriented x3, not in any acute distress. HEENT: anicteric sclera,  EOMI CVS regular  rate, normal r,  no murmur rubs or gallops Chest: clear to auscultation bilaterally, no wheezing, rales or rhonchi Abdomen: soft nontender, nondistended, normal bowel sounds, Extremities: no  clubbing or edema noted bilaterally Skin: no rashes, no new skin findings Neuro: nonfocal  Lab Results:  Recent Labs  04/13/13 1425 04/14/13 0345 04/14/13 0937  WBC 4.3 3.9*  --   HGB 14.6 13.8  --   HCT 45.8 41.6  --   PLT 145* 151 148*    BMET  Recent Labs  04/13/13 1425 04/14/13 0345  NA 136 137  K 4.1 3.8  CL 100 102  CO2 21 22  GLUCOSE 81 95  BUN 27* 22  CREATININE 0.96 0.85  CALCIUM 9.9 9.4    Micro Results: Recent Results (from the past 240 hour(s))  MRSA PCR SCREENING     Status: None   Collection Time    04/06/13  5:08 PM      Result Value Range Status   MRSA by PCR NEGATIVE  NEGATIVE Final   Comment:            The GeneXpert MRSA Assay (FDA     approved for NASAL specimens     only), is one component of a     comprehensive MRSA colonization     surveillance  program. It is not     intended to diagnose MRSA     infection nor to guide or     monitor treatment for     MRSA infections.  CULTURE, BLOOD (SINGLE)     Status: None   Collection Time    04/11/13  2:25 PM      Result Value Range Status   Preliminary Report Blood Culture received; No Growth to date;   Preliminary   Preliminary Report Culture will be held for 5 days before issuing   Preliminary   Preliminary Report a Final Negative report.   Preliminary  CULTURE, BLOOD (SINGLE)     Status: None   Collection Time    04/11/13  2:25 PM      Result Value Range Status   Preliminary Report Blood Culture received; No Growth to date;   Preliminary   Preliminary Report Culture will be held for 5 days before issuing   Preliminary   Preliminary Report a Final Negative report.   Preliminary  CULTURE, BLOOD (ROUTINE X 2)     Status: None   Collection Time    04/13/13  2:25 PM      Result Value Range Status    Specimen Description BLOOD LEFT ARM   Final   Special Requests BOTTLES DRAWN AEROBIC ONLY 8CC   Final   Culture  Setup Time     Final   Value: 04/13/2013 21:04     Performed at [facility]   Culture     Final   Value:        BLOOD CULTURE RECEIVED NO GROWTH TO DATE CULTURE WILL BE HELD FOR 5 DAYS BEFORE ISSUING A FINAL NEGATIVE REPORT     Performed at [facility]   Report Status PENDING   Incomplete  CULTURE, BLOOD (ROUTINE X 2)     Status: None   Collection Time    04/13/13  2:40 PM      Result Value Range Status   Specimen Description BLOOD RIGHT HAND   Final   Special Requests BOTTLES DRAWN AEROBIC ONLY 5CC   Final   Culture  Setup Time     Final   Value: 04/13/2013 21:03     Performed at [facility]   Culture     Final   Value:        BLOOD CULTURE RECEIVED NO GROWTH TO DATE CULTURE WILL BE HELD FOR 5 DAYS BEFORE ISSUING A FINAL NEGATIVE REPORT     Performed at [facility]   Report Status PENDING   Incomplete    Studies/Results: Dg Orthopantogram  04/14/2013   CLINICAL DATA:  Endocarditis.  EXAM: ORTHOPANTOGRAM/PANORAMIC  COMPARISON:  Brain MRI dated 04/04/2013  FINDINGS: There are multiple missing teeth. There is sclerotic bone around the roots of teeth 27, 28, and 29 which is felt to be a benign bone island. There is a tiny area of mixed density at the tip of the root of tooth number 21 which I doubt represents a periapical abscess.  There is a prominent cavity of the base of what is probably tooth 14 or 15.  IMPRESSION: Cavity at the base of tooth number 14 or 15.   Electronically Signed   By: Rozetta Nunnery M.D.   On: 04/14/2013 12:00   [place] Chest  04/13/2013   CLINICAL DATA:  Possible left breast abscess, status post antibiotic therapy  EXAM: CHEST ULTRASOUND  COMPARISON:  None.  FINDINGS: The lateral aspect  of the left breast and adjacent soft tissues were interrogated with grayscale ultrasound. Doppler interrogation was employed as well. There  is no evidence of an abnormal fluid collection. No discrete abscess is demonstrated. The echotexture of the image parenchyma is relatively nonspecific in appearance. No hypervascular areas are demonstrated.  IMPRESSION: There is no evidence of an abscess in the area of the known wound in the lateral aspect of the left breast   Electronically Signed   By: David  Martinique   On: 04/13/2013 15:44      Assessment/Plan: Marie Malone is a 62 y.o. 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

## 2013-04-14 NOTE — Progress Notes (Signed)
[facility]                                                                                Patient Demographics  Marie Malone, is a 62 y.o. female, DOB - 1950-05-27, [place] date - 04/13/2013   Admitting Physician No admitting provider for patient encounter.  Outpatient Primary MD for the patient is O'SULLIVAN,MELISSA S., NP  LOS - 1   No chief complaint on file.       Assessment & Plan    1. 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 Drucilla Schmidt who will follow the patient peripherally. If blood cultures remain negative for 72 hours we will proceed with PICC line for prolonged IV antibiotics to home health.      2. Recent left MCA embolic stroke. Continue antiplatelet treatment as above. Her deficits have almost completely resolved. Plavix and statin will be continued for secondary prevention.      3. Hypertension stable. Continue  beta blocker and Norvasc.      4. Moderate to severe pulmonary hypertension. She's not short of breath, has no pedal edema, outpatient followup with pulmonary post discharge.       5. Recent left breast abscess. Site looks almost completely healed, it drained by itself, the sound of the left breast shows no signs of abscess anymore.      6. Recent implantable loop recorder to look for paroxysmal atrial fibrillation, first degree AV block. Stable patient follows with [facility] in the outpatient setting.      Code Status: Full  Family Communication: daughter  Disposition Plan: Home   Procedures     Consults  ID   Medications  Scheduled Meds: . amLODipine  10 mg Oral Daily  . atorvastatin  80 mg Oral q1800  . cefTRIAXone (ROCEPHIN)  IV  2 g Intravenous Q12H  .  clopidogrel  75 mg Oral Q breakfast  . fluconazole  100 mg Oral Daily  . heparin  5,000 Units Subcutaneous Q8H  . metoprolol succinate  25 mg Oral Daily  . vancomycin  1,000 mg Intravenous Q12H   Continuous Infusions:  PRN Meds:.albuterol, alum & mag hydroxide-simeth, guaiFENesin-dextromethorphan, HYDROcodone-acetaminophen, ondansetron (ZOFRAN) IV, ondansetron, polyethylene glycol  DVT Prophylaxis   Heparin    Lab Results  Component Value Date   PLT 151 04/14/2013   PLT 148* 04/14/2013    Antibiotics     Anti-infectives   Start     Dose/Rate Route Frequency Ordered Stop   04/14/13 1000  fluconazole (DIFLUCAN) tablet 100 mg     100 mg Oral Daily 04/13/13 2219     04/13/13 1545  cefTRIAXone (ROCEPHIN) 2 g in dextrose 5 % 50 mL IVPB     2 g 100 mL/hr over 30 Minutes Intravenous Every 12 hours 04/13/13 1431     04/13/13 1530  vancomycin (VANCOCIN) IVPB 1000 mg/200 mL premix     1,000 mg 200 mL/hr over 60 Minutes Intravenous Every 12  hours 04/13/13 1514            Subjective:   Marie Malone today has, No headache, No chest pain, No abdominal pain - No Nausea, No new weakness tingling or numbness, No Cough - SOB.    Objective:   Filed Vitals:   04/13/13 1251 04/13/13 1302 04/13/13 2049 04/14/13 0406  BP: 144/77  150/63 144/71  Pulse: 89  76 77  Temp: 98.1 F (36.7 C)  98.3 F (36.8 C) 98.2 F (36.8 C)  TempSrc: Oral  Oral Oral  Resp: 18  18 17   Height:  5\' 2"  (1.575 m)    Weight:  83.099 kg (183 lb 3.2 oz)  83.462 kg (184 lb)  SpO2: 99%  100% 98%    Wt Readings from Last 3 Encounters:  04/14/13 83.462 kg (184 lb)  04/13/13 84.369 kg (186 lb)  04/12/13 84.55 kg (186 lb 6.4 oz)     Intake/Output Summary (Last 24 hours) at 04/14/13 1013 Last data filed at 04/14/13 0700  Gross per 24 hour  Intake    970 ml  Output    300 ml  Net    670 ml    Exam Awake Alert, Oriented X 3, No new F.N deficits, Normal affect [place].AT,PERRAL Supple Neck,No JVD, No cervical  lymphadenopathy appriciated.  Symmetrical Chest wall movement, Good air movement bilaterally, CTAB RRR,No Gallops,Rubs or new Murmurs, No Parasternal Heave +ve B.Sounds, Abd Soft, Non tender, No organomegaly appriciated, No rebound - guarding or rigidity. No Cyanosis, Clubbing or edema, No new Rash or bruise      Data Review   Micro Results Recent Results (from the past 240 hour(s))  MRSA PCR SCREENING     Status: None   Collection Time    04/06/13  5:08 PM      Result Value Range Status   MRSA by PCR NEGATIVE  NEGATIVE Final   Comment:            The GeneXpert MRSA Assay (FDA     approved for NASAL specimens     only), is one component of a     comprehensive MRSA colonization     surveillance program. It is not     intended to diagnose MRSA     infection nor to guide or     monitor treatment for     MRSA infections.  CULTURE, BLOOD (SINGLE)     Status: None   Collection Time    04/11/13  2:25 PM      Result Value Range Status   Preliminary Report Blood Culture received; No Growth to date;   Preliminary   Preliminary Report Culture will be held for 5 days before issuing   Preliminary   Preliminary Report a Final Negative report.   Preliminary  CULTURE, BLOOD (SINGLE)     Status: None   Collection Time    04/11/13  2:25 PM      Result Value Range Status   Preliminary Report Blood Culture received; No Growth to date;   Preliminary   Preliminary Report Culture will be held for 5 days before issuing   Preliminary   Preliminary Report a Final Negative report.   Preliminary  CULTURE, BLOOD (ROUTINE X 2)     Status: None   Collection Time    04/13/13  2:25 PM      Result Value Range Status   Specimen Description BLOOD LEFT ARM   Final   Special Requests BOTTLES DRAWN AEROBIC ONLY  [facility]   Final   Culture  Setup Time     Final   Value: 04/13/2013 21:04     Performed at [facility]   Culture     Final   Value:        BLOOD CULTURE RECEIVED NO GROWTH TO DATE CULTURE WILL  BE HELD FOR 5 DAYS BEFORE ISSUING A FINAL NEGATIVE REPORT     Performed at [facility]   Report Status PENDING   Incomplete  CULTURE, BLOOD (ROUTINE X 2)     Status: None   Collection Time    04/13/13  2:40 PM      Result Value Range Status   Specimen Description BLOOD RIGHT HAND   Final   Special Requests BOTTLES DRAWN AEROBIC ONLY 5CC   Final   Culture  Setup Time     Final   Value: 04/13/2013 21:03     Performed at [facility]   Culture     Final   Value:        BLOOD CULTURE RECEIVED NO GROWTH TO DATE CULTURE WILL BE HELD FOR 5 DAYS BEFORE ISSUING A FINAL NEGATIVE REPORT     Performed at [facility]   Report Status PENDING   Incomplete    Radiology Reports Ct Head Wo Contrast  04/06/2013   CLINICAL DATA:  Right-sided weakness and slurred speech  EXAM: CT HEAD WITHOUT CONTRAST  TECHNIQUE: Contiguous axial images were obtained from the base of the skull through the vertex without intravenous contrast.  COMPARISON:  09/25/2006  FINDINGS: The bony calvarium is intact. No gross soft tissue abnormality is noted. There is a geographic area identified in the left parietal lobe which roughly measures 3.9 x 3.4 cm in greatest dimension consistent with an evolving infarct. No significant mass effect is noted. No significant midline shift is seen. No acute hemorrhage or space-occupying mass lesion is noted.  IMPRESSION: Evolving infarct in the distribution of the left middle cerebral artery in the left parietal lobe. No associated hemorrhage is noted. No other focal abnormality is noted.  These results were called by telephone at the time of interpretation on 04/06/2013 at 1:58 PM to Dr. Wendi Snipes , who verbally acknowledged these results.   Electronically Signed   By: Inez Catalina M.D.   On: 04/06/2013 14:00   Mri Brain Without Contrast  04/06/2013   CLINICAL DATA:  62 year old female with acute onset right side weakness and slurred speech. Left MCA infarcts suspected on  CT. Initial encounter. History of remote infarcts.  EXAM: MRI HEAD WITHOUT CONTRAST  MRA HEAD WITHOUT CONTRAST  TECHNIQUE: Multiplanar, multiecho pulse sequences of the brain and surrounding structures were obtained without intravenous contrast. Angiographic images of the head were obtained using MRA technique without contrast.  COMPARISON:  Head CTs 04/06/2013 and earlier.  FINDINGS: MRI HEAD FINDINGS  Moderate size confluent restricted diffusion in the posterior left MCA territory corresponding to the earlier CT finding. The area of diffusion abnormality encompasses 5-6 cm. There is T2 an less pronounced FLAIR hyperintensity an gyral edema. There is an associated conspicuous focus of increased FLAIR signal within a left posterior MCA branch on series 7, image 8. Downstream branches also show asymmetric increased FLAIR signal (images 9 and 10). MRA findings are below.  No acute intracranial hemorrhage identified. No right hemisphere or posterior fossa restricted diffusion. Other Major intracranial vascular flow voids are preserved.  Small chronic left cerebellar lacunar infarct. Small bilateral chronic basal ganglia lacunar  infarcts versus perivascular spaces. There is also a small focus of cortical encephalomalacia in the right sylvian fissure on series 7, image 10. Otherwise minimal superimposed nonspecific right hemisphere periventricular white matter T2 and FLAIR hyperintensity.  No midline shift, mass effect, evidence of mass lesion, ventriculomegaly, extra-axial collection or acute intracranial hemorrhage. Cervicomedullary junction and pituitary are within normal limits. Negative visualized cervical spine. Visualized bone marrow signal is within normal limits. Visualized orbit soft tissues are within normal limits. Minor paranasal sinus mucosal thickening. Mild left mastoid fluid. Negative nasopharynx. Visualized scalp soft tissues are within normal limits.  MRA HEAD FINDINGS  Antegrade flow in the posterior  circulation. Dominant distal right vertebral artery. Normal PICA origins. Patent vertebrobasilar junction. No basilar stenosis. SCA and PCA origins are normal. Posterior communicating arteries are diminutive or absent. Bilateral PCA branches are within normal limits.  Antegrade flow in both ICA siphons. No ICA siphon stenosis. Patent carotid termini. Normal visualized bilateral ACA branches. The anterior communicating artery is diminutive or absent. Visualized right MCA branches are within normal limits.  Normal left MCA origin. The left M1 segment is patent. The dominant left MCA posterior sylvian branch is occluded about 10 mm beyond its origin (series 5, images 69 -73). This corresponds to the level of the abnormal FLAIR signal described above. The left MCA anterior division branches appear within normal limits.  IMPRESSION: 1. Moderate-sized acute posterior left MCA infarct. Edema without significant mass effect. No associated hemorrhage at this time. 2. Underlying small areas of chronic small and medium-sized vessel ischemia. 3. Occluded left MCA posterior M2 branch about 10 mm beyond its origin. 4. Negative more proximal anterior circulation and otherwise negative intracranial MRA.   Electronically Signed   By: Lars Pinks M.D.   On: 04/06/2013 20:38   [place] Chest  04/13/2013   CLINICAL DATA:  Possible left breast abscess, status post antibiotic therapy  EXAM: CHEST ULTRASOUND  COMPARISON:  None.  FINDINGS: The lateral aspect of the left breast and adjacent soft tissues were interrogated with grayscale ultrasound. Doppler interrogation was employed as well. There is no evidence of an abnormal fluid collection. No discrete abscess is demonstrated. The echotexture of the image parenchyma is relatively nonspecific in appearance. No hypervascular areas are demonstrated.  IMPRESSION: There is no evidence of an abscess in the area of the known wound in the lateral aspect of the left breast   Electronically Signed    By: David  [place]   On: 04/13/2013 15:44   Mr Jodene Nam Head/brain Wo Cm  04/06/2013   CLINICAL DATA:  62 year old female with acute onset right side weakness and slurred speech. Left MCA infarcts suspected on CT. Initial encounter. History of remote infarcts.  EXAM: MRI HEAD WITHOUT CONTRAST  MRA HEAD WITHOUT CONTRAST  TECHNIQUE: Multiplanar, multiecho pulse sequences of the brain and surrounding structures were obtained without intravenous contrast. Angiographic images of the head were obtained using MRA technique without contrast.  COMPARISON:  Head CTs 04/06/2013 and earlier.  FINDINGS: MRI HEAD FINDINGS  Moderate size confluent restricted diffusion in the posterior left MCA territory corresponding to the earlier CT finding. The area of diffusion abnormality encompasses 5-6 cm. There is T2 an less pronounced FLAIR hyperintensity an gyral edema. There is an associated conspicuous focus of increased FLAIR signal within a left posterior MCA branch on series 7, image 8. Downstream branches also show asymmetric increased FLAIR signal (images 9 and 10). MRA findings are below.  No acute intracranial hemorrhage identified. No right hemisphere or  posterior fossa restricted diffusion. Other Major intracranial vascular flow voids are preserved.  Small chronic left cerebellar lacunar infarct. Small bilateral chronic basal ganglia lacunar infarcts versus perivascular spaces. There is also a small focus of cortical encephalomalacia in the right sylvian fissure on series 7, image 10. Otherwise minimal superimposed nonspecific right hemisphere periventricular white matter T2 and FLAIR hyperintensity.  No midline shift, mass effect, evidence of mass lesion, ventriculomegaly, extra-axial collection or acute intracranial hemorrhage. Cervicomedullary junction and pituitary are within normal limits. Negative visualized cervical spine. Visualized bone marrow signal is within normal limits. Visualized orbit soft tissues are within  normal limits. Minor paranasal sinus mucosal thickening. Mild left mastoid fluid. Negative nasopharynx. Visualized scalp soft tissues are within normal limits.  MRA HEAD FINDINGS  Antegrade flow in the posterior circulation. Dominant distal right vertebral artery. Normal PICA origins. Patent vertebrobasilar junction. No basilar stenosis. SCA and PCA origins are normal. Posterior communicating arteries are diminutive or absent. Bilateral PCA branches are within normal limits.  Antegrade flow in both ICA siphons. No ICA siphon stenosis. Patent carotid termini. Normal visualized bilateral ACA branches. The anterior communicating artery is diminutive or absent. Visualized right MCA branches are within normal limits.  Normal left MCA origin. The left M1 segment is patent. The dominant left MCA posterior sylvian branch is occluded about 10 mm beyond its origin (series 5, images 69 -73). This corresponds to the level of the abnormal FLAIR signal described above. The left MCA anterior division branches appear within normal limits.  IMPRESSION: 1. Moderate-sized acute posterior left MCA infarct. Edema without significant mass effect. No associated hemorrhage at this time. 2. Underlying small areas of chronic small and medium-sized vessel ischemia. 3. Occluded left MCA posterior M2 branch about 10 mm beyond its origin. 4. Negative more proximal anterior circulation and otherwise negative intracranial MRA.   Electronically Signed   By: Lars Pinks M.D.   On: 04/06/2013 20:38    CBC  Recent Labs Lab 04/13/13 1425 04/14/13 0345  WBC 4.3 3.9*  HGB 14.6 13.8  HCT 45.8 41.6  PLT 145* 151  148*  MCV 85.9 84.0  MCH 27.4 27.9  MCHC 31.9 33.2  RDW 14.6 14.4    Chemistries   Recent Labs Lab 04/08/13 0630 04/13/13 1425 04/14/13 0345  NA 139 136 137  K 3.5 4.1 3.8  CL 103 100 102  CO2 21 21 22   GLUCOSE 95 81 95  BUN 14 27* 22  CREATININE 0.74 0.96 0.85  CALCIUM 9.1 9.9 9.4  AST  --  50*  --   ALT  --  37*   --   ALKPHOS  --  99  --   BILITOT  --  0.7  --    ------------------------------------------------------------------------------------------------------------------ estimated creatinine clearance is 69.7 ml/min (by C-G formula based on Cr of 0.85). ------------------------------------------------------------------------------------------------------------------ No results found for this basename: HGBA1C,  in the last 72 hours ------------------------------------------------------------------------------------------------------------------ No results found for this basename: CHOL, HDL, LDLCALC, TRIG, CHOLHDL, LDLDIRECT,  in the last 72 hours ------------------------------------------------------------------------------------------------------------------ No results found for this basename: TSH, T4TOTAL, FREET3, T3FREE, THYROIDAB,  in the last 72 hours ------------------------------------------------------------------------------------------------------------------ No results found for this basename: VITAMINB12, FOLATE, FERRITIN, TIBC, IRON, RETICCTPCT,  in the last 72 hours  Coagulation profile  Recent Labs Lab 04/14/13 0345  INR PENDING     Recent Labs  04/14/13 0345  DDIMER PENDING    Cardiac Enzymes No results found for this basename: CK, CKMB, TROPONINI, MYOGLOBIN,  in the last 168 hours ------------------------------------------------------------------------------------------------------------------  No components found with this basename: POCBNP,      Time Spent in minutes   35   Lala Lund K M.D on 04/14/2013 at 10:13 AM  Between 7am to 7pm - Pager - [phone]  After 7pm go to [email or web address] - password [email or web address]  And look for the night coverage person covering for me after hours  [facility] Office  [phone]

## 2013-04-15 ENCOUNTER — Inpatient Hospital Stay (HOSPITAL_COMMUNITY): Payer: BC Managed Care – PPO

## 2013-04-15 DIAGNOSIS — N61 Mastitis without abscess: Secondary | ICD-10-CM

## 2013-04-15 DIAGNOSIS — R52 Pain, unspecified: Secondary | ICD-10-CM

## 2013-04-15 LAB — VANCOMYCIN, TROUGH: Vancomycin Tr: 19.4 ug/mL (ref 10.0–20.0)

## 2013-04-15 MED ORDER — SODIUM CHLORIDE 0.9 % IJ SOLN
10.0000 mL | INTRAMUSCULAR | Status: DC | PRN
  Administered 2013-04-16: 10 mL

## 2013-04-15 MED ORDER — SODIUM CHLORIDE 0.9 % IJ SOLN: 10.0000 mL | Freq: Two times a day (BID) | INTRAMUSCULAR | Status: DC

## 2013-04-15 NOTE — Progress Notes (Signed)
[facility]                                                                                Patient Demographics  Marie Malone, is a 62 y.o. female, DOB - May 24, 1950, [place] date - 04/13/2013   Admitting Physician No admitting provider for patient encounter.  Outpatient Primary MD for the patient is O'SULLIVAN,MELISSA S., NP  LOS - 2   No chief complaint on file.       Assessment & Plan    1. 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 Drucilla Schmidt who will follow the patient peripherally. If blood cultures remain negative for 72 hours we will proceed with PICC line for prolonged IV antibiotics to home health.    Requested PICC line team to place a PICC line 04/16/2013, will review blood culture report again in the morning of 04/16/2013.    2. Recent left MCA embolic stroke. Continue antiplatelet treatment as above. Her deficits have almost completely resolved. Plavix and statin will be continued for secondary prevention.      3. Hypertension stable. Continue  beta blocker and Norvasc.      4. Moderate to severe pulmonary hypertension. She's not short of breath, has no pedal edema, outpatient followup with pulmonary post discharge.       5. Recent left breast abscess. Site looks almost completely healed, it drained by itself, the sound of the left breast shows no signs of abscess anymore.      6. Recent implantable loop recorder to look for paroxysmal atrial fibrillation, first degree AV block. Stable patient follows with [place] [facility] in the outpatient setting.      Code Status: Full  Family Communication: daughter  Disposition Plan: Home   Procedures     Consults  ID   Medications  Scheduled  Meds: . amLODipine  10 mg Oral Daily  . atorvastatin  80 mg Oral q1800  . cefTRIAXone (ROCEPHIN)  IV  2 g Intravenous Q12H  . clopidogrel  75 mg Oral Q breakfast  . fluconazole  100 mg Oral Daily  . heparin  5,000 Units Subcutaneous Q8H  . metoprolol succinate  25 mg Oral Daily  . vancomycin  1,000 mg Intravenous Q12H   Continuous Infusions:  PRN Meds:.albuterol, alum & mag hydroxide-simeth, guaiFENesin-dextromethorphan, HYDROcodone-acetaminophen, ondansetron (ZOFRAN) IV, ondansetron, polyethylene glycol  DVT Prophylaxis   Heparin    Lab Results  Component Value Date   PLT 148* 04/14/2013    Antibiotics     Anti-infectives   Start     Dose/Rate Route Frequency Ordered Stop   04/14/13 1000  fluconazole (DIFLUCAN) tablet 100 mg     100 mg Oral Daily 04/13/13 2219     04/13/13 1545  cefTRIAXone (ROCEPHIN) 2 g in dextrose 5 % 50 mL IVPB     2 g 100 mL/hr over 30 Minutes Intravenous Every 12 hours 04/13/13 1431     04/13/13 1530  vancomycin (VANCOCIN) IVPB 1000  mg/200 mL premix     1,000 mg 200 mL/hr over 60 Minutes Intravenous Every 12 hours 04/13/13 1514            Subjective:   Marie Malone today has, No headache, No chest pain, No abdominal pain - No Nausea, No new weakness tingling or numbness, No Cough - SOB.    Objective:   Filed Vitals:   04/14/13 0406 04/14/13 1421 04/14/13 2023 04/15/13 0414  BP: 144/71 129/53 119/70 115/72  Pulse: 77 77 75 74  Temp: 98.2 F (36.8 C) 98.3 F (36.8 C) 98.8 F (37.1 C) 98.5 F (36.9 C)  TempSrc: Oral Oral Oral Oral  Resp: 17 16 17 15   Height:      Weight: 83.462 kg (184 lb)   83.416 kg (183 lb 14.4 oz)  SpO2: 98% 100% 96% 98%    Wt Readings from Last 3 Encounters:  04/15/13 83.416 kg (183 lb 14.4 oz)  04/13/13 84.369 kg (186 lb)  04/12/13 84.55 kg (186 lb 6.4 oz)     Intake/Output Summary (Last 24 hours) at 04/15/13 0941 Last data filed at 04/15/13 0700  Gross per 24 hour  Intake   1280 ml  Output    550 ml   Net    730 ml    Exam Awake Alert, Oriented X 3, No new F.N deficits, Normal affect [place].AT,PERRAL Supple Neck,No JVD, No cervical lymphadenopathy appriciated.  Symmetrical Chest wall movement, Good air movement bilaterally, CTAB RRR,No Gallops,Rubs or new Murmurs, No Parasternal Heave +ve B.Sounds, Abd Soft, Non tender, No organomegaly appriciated, No rebound - guarding or rigidity. No Cyanosis, Clubbing or edema, No new Rash or bruise      Data Review   Micro Results Recent Results (from the past 240 hour(s))  MRSA PCR SCREENING     Status: None   Collection Time    04/06/13  5:08 PM      Result Value Range Status   MRSA by PCR NEGATIVE  NEGATIVE Final   Comment:            The GeneXpert MRSA Assay (FDA     approved for NASAL specimens     only), is one component of a     comprehensive MRSA colonization     surveillance program. It is not     intended to diagnose MRSA     infection nor to guide or     monitor treatment for     MRSA infections.  CULTURE, BLOOD (SINGLE)     Status: None   Collection Time    04/11/13  2:25 PM      Result Value Range Status   Preliminary Report Blood Culture received; No Growth to date;   Preliminary   Preliminary Report Culture will be held for 5 days before issuing   Preliminary   Preliminary Report a Final Negative report.   Preliminary  CULTURE, BLOOD (SINGLE)     Status: None   Collection Time    04/11/13  2:25 PM      Result Value Range Status   Preliminary Report Blood Culture received; No Growth to date;   Preliminary   Preliminary Report Culture will be held for 5 days before issuing   Preliminary   Preliminary Report a Final Negative report.   Preliminary  CULTURE, BLOOD (ROUTINE X 2)     Status: None   Collection Time    04/13/13  2:25 PM      Result Value Range Status  Specimen Description BLOOD LEFT ARM   Final   Special Requests BOTTLES DRAWN AEROBIC ONLY 8CC   Final   Culture  Setup Time     Final   Value: 04/13/2013  21:04     Performed at [facility]   Culture     Final   Value:        BLOOD CULTURE RECEIVED NO GROWTH TO DATE CULTURE WILL BE HELD FOR 5 DAYS BEFORE ISSUING A FINAL NEGATIVE REPORT     Performed at [facility]   Report Status PENDING   Incomplete  CULTURE, BLOOD (ROUTINE X 2)     Status: None   Collection Time    04/13/13  2:40 PM      Result Value Range Status   Specimen Description BLOOD RIGHT HAND   Final   Special Requests BOTTLES DRAWN AEROBIC ONLY 5CC   Final   Culture  Setup Time     Final   Value: 04/13/2013 21:03     Performed at [facility]   Culture     Final   Value:        BLOOD CULTURE RECEIVED NO GROWTH TO DATE CULTURE WILL BE HELD FOR 5 DAYS BEFORE ISSUING A FINAL NEGATIVE REPORT     Performed at [facility]   Report Status PENDING   Incomplete    Radiology Reports Ct Head Wo Contrast  04/06/2013   CLINICAL DATA:  Right-sided weakness and slurred speech  EXAM: CT HEAD WITHOUT CONTRAST  TECHNIQUE: Contiguous axial images were obtained from the base of the skull through the vertex without intravenous contrast.  COMPARISON:  09/25/2006  FINDINGS: The bony calvarium is intact. No gross soft tissue abnormality is noted. There is a geographic area identified in the left parietal lobe which roughly measures 3.9 x 3.4 cm in greatest dimension consistent with an evolving infarct. No significant mass effect is noted. No significant midline shift is seen. No acute hemorrhage or space-occupying mass lesion is noted.  IMPRESSION: Evolving infarct in the distribution of the left middle cerebral artery in the left parietal lobe. No associated hemorrhage is noted. No other focal abnormality is noted.  These results were called by telephone at the time of interpretation on 04/06/2013 at 1:58 PM to Dr. Wendi Snipes , who verbally acknowledged these results.   Electronically Signed   By: Inez Catalina M.D.   On: 04/06/2013 14:00   Mri Brain Without  Contrast  04/06/2013   CLINICAL DATA:  61 year old female with acute onset right side weakness and slurred speech. Left MCA infarcts suspected on CT. Initial encounter. History of remote infarcts.  EXAM: MRI HEAD WITHOUT CONTRAST  MRA HEAD WITHOUT CONTRAST  TECHNIQUE: Multiplanar, multiecho pulse sequences of the brain and surrounding structures were obtained without intravenous contrast. Angiographic images of the head were obtained using MRA technique without contrast.  COMPARISON:  Head CTs 04/06/2013 and earlier.  FINDINGS: MRI HEAD FINDINGS  Moderate size confluent restricted diffusion in the posterior left MCA territory corresponding to the earlier CT finding. The area of diffusion abnormality encompasses 5-6 cm. There is T2 an less pronounced FLAIR hyperintensity an gyral edema. There is an associated conspicuous focus of increased FLAIR signal within a left posterior MCA branch on series 7, image 8. Downstream branches also show asymmetric increased FLAIR signal (images 9 and 10). MRA findings are below.  No acute intracranial hemorrhage identified. No right hemisphere or posterior fossa restricted diffusion. Other Major intracranial vascular flow  voids are preserved.  Small chronic left cerebellar lacunar infarct. Small bilateral chronic basal ganglia lacunar infarcts versus perivascular spaces. There is also a small focus of cortical encephalomalacia in the right sylvian fissure on series 7, image 10. Otherwise minimal superimposed nonspecific right hemisphere periventricular white matter T2 and FLAIR hyperintensity.  No midline shift, mass effect, evidence of mass lesion, ventriculomegaly, extra-axial collection or acute intracranial hemorrhage. Cervicomedullary junction and pituitary are within normal limits. Negative visualized cervical spine. Visualized bone marrow signal is within normal limits. Visualized orbit soft tissues are within normal limits. Minor paranasal sinus mucosal thickening. Mild  left mastoid fluid. Negative nasopharynx. Visualized scalp soft tissues are within normal limits.  MRA HEAD FINDINGS  Antegrade flow in the posterior circulation. Dominant distal right vertebral artery. Normal PICA origins. Patent vertebrobasilar junction. No basilar stenosis. SCA and PCA origins are normal. Posterior communicating arteries are diminutive or absent. Bilateral PCA branches are within normal limits.  Antegrade flow in both ICA siphons. No ICA siphon stenosis. Patent carotid termini. Normal visualized bilateral ACA branches. The anterior communicating artery is diminutive or absent. Visualized right MCA branches are within normal limits.  Normal left MCA origin. The left M1 segment is patent. The dominant left MCA posterior sylvian branch is occluded about 10 mm beyond its origin (series 5, images 69 -73). This corresponds to the level of the abnormal FLAIR signal described above. The left MCA anterior division branches appear within normal limits.  IMPRESSION: 1. Moderate-sized acute posterior left MCA infarct. Edema without significant mass effect. No associated hemorrhage at this time. 2. Underlying small areas of chronic small and medium-sized vessel ischemia. 3. Occluded left MCA posterior M2 branch about 10 mm beyond its origin. 4. Negative more proximal anterior circulation and otherwise negative intracranial MRA.   Electronically Signed   By: Lars Pinks M.D.   On: 04/06/2013 20:38   [place] Chest  04/13/2013   CLINICAL DATA:  Possible left breast abscess, status post antibiotic therapy  EXAM: CHEST ULTRASOUND  COMPARISON:  None.  FINDINGS: The lateral aspect of the left breast and adjacent soft tissues were interrogated with grayscale ultrasound. Doppler interrogation was employed as well. There is no evidence of an abnormal fluid collection. No discrete abscess is demonstrated. The echotexture of the image parenchyma is relatively nonspecific in appearance. No hypervascular areas are  demonstrated.  IMPRESSION: There is no evidence of an abscess in the area of the known wound in the lateral aspect of the left breast   Electronically Signed   By: David  [place]   On: 04/13/2013 15:44   Mr Jodene Nam Head/brain Wo Cm  04/06/2013   CLINICAL DATA:  62 year old female with acute onset right side weakness and slurred speech. Left MCA infarcts suspected on CT. Initial encounter. History of remote infarcts.  EXAM: MRI HEAD WITHOUT CONTRAST  MRA HEAD WITHOUT CONTRAST  TECHNIQUE: Multiplanar, multiecho pulse sequences of the brain and surrounding structures were obtained without intravenous contrast. Angiographic images of the head were obtained using MRA technique without contrast.  COMPARISON:  Head CTs 04/06/2013 and earlier.  FINDINGS: MRI HEAD FINDINGS  Moderate size confluent restricted diffusion in the posterior left MCA territory corresponding to the earlier CT finding. The area of diffusion abnormality encompasses 5-6 cm. There is T2 an less pronounced FLAIR hyperintensity an gyral edema. There is an associated conspicuous focus of increased FLAIR signal within a left posterior MCA branch on series 7, image 8. Downstream branches also show asymmetric increased FLAIR signal (images 9  and 10). MRA findings are below.  No acute intracranial hemorrhage identified. No right hemisphere or posterior fossa restricted diffusion. Other Major intracranial vascular flow voids are preserved.  Small chronic left cerebellar lacunar infarct. Small bilateral chronic basal ganglia lacunar infarcts versus perivascular spaces. There is also a small focus of cortical encephalomalacia in the right sylvian fissure on series 7, image 10. Otherwise minimal superimposed nonspecific right hemisphere periventricular white matter T2 and FLAIR hyperintensity.  No midline shift, mass effect, evidence of mass lesion, ventriculomegaly, extra-axial collection or acute intracranial hemorrhage. Cervicomedullary junction and pituitary  are within normal limits. Negative visualized cervical spine. Visualized bone marrow signal is within normal limits. Visualized orbit soft tissues are within normal limits. Minor paranasal sinus mucosal thickening. Mild left mastoid fluid. Negative nasopharynx. Visualized scalp soft tissues are within normal limits.  MRA HEAD FINDINGS  Antegrade flow in the posterior circulation. Dominant distal right vertebral artery. Normal PICA origins. Patent vertebrobasilar junction. No basilar stenosis. SCA and PCA origins are normal. Posterior communicating arteries are diminutive or absent. Bilateral PCA branches are within normal limits.  Antegrade flow in both ICA siphons. No ICA siphon stenosis. Patent carotid termini. Normal visualized bilateral ACA branches. The anterior communicating artery is diminutive or absent. Visualized right MCA branches are within normal limits.  Normal left MCA origin. The left M1 segment is patent. The dominant left MCA posterior sylvian branch is occluded about 10 mm beyond its origin (series 5, images 69 -73). This corresponds to the level of the abnormal FLAIR signal described above. The left MCA anterior division branches appear within normal limits.  IMPRESSION: 1. Moderate-sized acute posterior left MCA infarct. Edema without significant mass effect. No associated hemorrhage at this time. 2. Underlying small areas of chronic small and medium-sized vessel ischemia. 3. Occluded left MCA posterior M2 branch about 10 mm beyond its origin. 4. Negative more proximal anterior circulation and otherwise negative intracranial MRA.   Electronically Signed   By: Lars Pinks M.D.   On: 04/06/2013 20:38    CBC  Recent Labs Lab 04/13/13 1425 04/14/13 0345 04/14/13 0937  WBC 4.3 3.9*  --   HGB 14.6 13.8  --   HCT 45.8 41.6  --   PLT 145* 151 148*  MCV 85.9 84.0  --   MCH 27.4 27.9  --   MCHC 31.9 33.2  --   RDW 14.6 14.4  --     Chemistries   Recent Labs Lab 04/13/13 1425  04/14/13 0345  NA 136 137  K 4.1 3.8  CL 100 102  CO2 21 22  GLUCOSE 81 95  BUN 27* 22  CREATININE 0.96 0.85  CALCIUM 9.9 9.4  AST 50*  --   ALT 37*  --   ALKPHOS 99  --   BILITOT 0.7  --    ------------------------------------------------------------------------------------------------------------------ estimated creatinine clearance is 69.6 ml/min (by C-G formula based on Cr of 0.85). ------------------------------------------------------------------------------------------------------------------ No results found for this basename: HGBA1C,  in the last 72 hours ------------------------------------------------------------------------------------------------------------------ No results found for this basename: CHOL, HDL, LDLCALC, TRIG, CHOLHDL, LDLDIRECT,  in the last 72 hours ------------------------------------------------------------------------------------------------------------------ No results found for this basename: TSH, T4TOTAL, FREET3, T3FREE, THYROIDAB,  in the last 72 hours ------------------------------------------------------------------------------------------------------------------ No results found for this basename: VITAMINB12, FOLATE, FERRITIN, TIBC, IRON, RETICCTPCT,  in the last 72 hours  Coagulation profile  Recent Labs Lab 04/14/13 0937  INR 1.07     Recent Labs  04/14/13 0937  DDIMER 0.86*    Cardiac Enzymes  No results found for this basename: CK, CKMB, TROPONINI, MYOGLOBIN,  in the last 168 hours ------------------------------------------------------------------------------------------------------------------ No components found with this basename: POCBNP,      Time Spent in minutes   35   Hakim Minniefield K M.D on 04/15/2013 at 9:41 AM  Between 7am to 7pm - Pager - [phone]  After 7pm go to [email or web address] - password [email or web address]  And look for the night coverage person covering for me after hours  [facility] Office   [phone]

## 2013-04-15 NOTE — Progress Notes (Addendum)
[facility]   Day # 3 rocephin and vancomycin   Subjective: No new complaints   Antibiotics:  Anti-infectives   Start     Dose/Rate Route Frequency Ordered Stop   04/14/13 1000  fluconazole (DIFLUCAN) tablet 100 mg     100 mg Oral Daily 04/13/13 2219     04/13/13 1545  cefTRIAXone (ROCEPHIN) 2 g in dextrose 5 % 50 mL IVPB     2 g 100 mL/hr over 30 Minutes Intravenous Every 12 hours 04/13/13 1431     04/13/13 1530  vancomycin (VANCOCIN) IVPB 1000 mg/200 mL premix     1,000 mg 200 mL/hr over 60 Minutes Intravenous Every 12 hours 04/13/13 1514        Medications: Scheduled Meds: . amLODipine  10 mg Oral Daily  . atorvastatin  80 mg Oral q1800  . cefTRIAXone (ROCEPHIN)  IV  2 g Intravenous Q12H  . clopidogrel  75 mg Oral Q breakfast  . fluconazole  100 mg Oral Daily  . heparin  5,000 Units Subcutaneous Q8H  . metoprolol succinate  25 mg Oral Daily  . vancomycin  1,000 mg Intravenous Q12H   Continuous Infusions:  PRN Meds:.albuterol, alum & mag hydroxide-simeth, guaiFENesin-dextromethorphan, HYDROcodone-acetaminophen, ondansetron (ZOFRAN) IV, ondansetron, polyethylene glycol   Objective: Weight change: 11.2 oz (0.318 kg)  Intake/Output Summary (Last 24 hours) at 04/15/13 1310 Last data filed at 04/15/13 0700  Gross per 24 hour  Intake   1160 ml  Output    350 ml  Net    810 ml   Blood pressure 115/72, pulse 74, temperature 98.5 F (36.9 C), temperature source Oral, resp. rate 15, height 5\' 2"  (1.575 m), weight 183 lb 14.4 oz (83.416 kg), SpO2 98.00%. Temp:  [98.3 F (36.8 C)-98.8 F (37.1 C)] 98.5 F (36.9 C) (12/07 0414) Pulse Rate:  [74-77] 74 (12/07 0414) Resp:  [15-17] 15 (12/07 0414) BP: (115-129)/(53-72) 115/72 mmHg (12/07 0414) SpO2:  [96 %-100 %] 98 % (12/07 0414) Weight:  [183 lb 14.4 oz (83.416 kg)] 183 lb 14.4 oz (83.416 kg) (12/07 0414)  Physical Exam: General: Alert and awake, oriented x3, not in any acute  distress. HEENT: anicteric sclera,  EOMI CVS regular rate, normal r,  no murmur rubs or gallops Chest: clear to auscultation bilaterally, no wheezing, rales or rhonchi Abdomen: soft nontender, nondistended, normal bowel sounds, Extremities: she has tenderness around her PIV Skin: no rashes, no new skin findings that I can appreciate, I took pix, see below:            Neuro: nonfocal  Lab Results:  Recent Labs  04/13/13 1425 04/14/13 0345 04/14/13 0937  WBC 4.3 3.9*  --   HGB 14.6 13.8  --   HCT 45.8 41.6  --   PLT 145* 151 148*    BMET  Recent Labs  04/13/13 1425 04/14/13 0345  NA 136 137  K 4.1 3.8  CL 100 102  CO2 21 22  GLUCOSE 81 95  BUN 27* 22  CREATININE 0.96 0.85  CALCIUM 9.9 9.4    Micro Results: Recent Results (from the past 240 hour(s))  MRSA PCR SCREENING     Status: None   Collection Time    04/06/13  5:08 PM      Result Value Range Status   MRSA by PCR NEGATIVE  NEGATIVE Final   Comment:            The GeneXpert MRSA Assay (FDA  approved for NASAL specimens     only), is one component of a     comprehensive MRSA colonization     surveillance program. It is not     intended to diagnose MRSA     infection nor to guide or     monitor treatment for     MRSA infections.  CULTURE, BLOOD (SINGLE)     Status: None   Collection Time    04/11/13  2:25 PM      Result Value Range Status   Preliminary Report Blood Culture received; No Growth to date;   Preliminary   Preliminary Report Culture will be held for 5 days before issuing   Preliminary   Preliminary Report a Final Negative report.   Preliminary  CULTURE, BLOOD (SINGLE)     Status: None   Collection Time    04/11/13  2:25 PM      Result Value Range Status   Preliminary Report Blood Culture received; No Growth to date;   Preliminary   Preliminary Report Culture will be held for 5 days before issuing   Preliminary   Preliminary Report a Final Negative report.   Preliminary    CULTURE, BLOOD (ROUTINE X 2)     Status: None   Collection Time    04/13/13  2:25 PM      Result Value Range Status   Specimen Description BLOOD LEFT ARM   Final   Special Requests BOTTLES DRAWN AEROBIC ONLY 8CC   Final   Culture  Setup Time     Final   Value: 04/13/2013 21:04     Performed at [facility]   Culture     Final   Value:        BLOOD CULTURE RECEIVED NO GROWTH TO DATE CULTURE WILL BE HELD FOR 5 DAYS BEFORE ISSUING A FINAL NEGATIVE REPORT     Performed at [facility]   Report Status PENDING   Incomplete  CULTURE, BLOOD (ROUTINE X 2)     Status: None   Collection Time    04/13/13  2:40 PM      Result Value Range Status   Specimen Description BLOOD RIGHT HAND   Final   Special Requests BOTTLES DRAWN AEROBIC ONLY 5CC   Final   Culture  Setup Time     Final   Value: 04/13/2013 21:03     Performed at [facility]   Culture     Final   Value:        BLOOD CULTURE RECEIVED NO GROWTH TO DATE CULTURE WILL BE HELD FOR 5 DAYS BEFORE ISSUING A FINAL NEGATIVE REPORT     Performed at [facility]   Report Status PENDING   Incomplete    Studies/Results: Dg Orthopantogram  04/14/2013   CLINICAL DATA:  Endocarditis.  EXAM: ORTHOPANTOGRAM/PANORAMIC  COMPARISON:  Brain MRI dated 04/04/2013  FINDINGS: There are multiple missing teeth. There is sclerotic bone around the roots of teeth 27, 28, and 29 which is felt to be a benign bone island. There is a tiny area of mixed density at the tip of the root of tooth number 21 which I doubt represents a periapical abscess.  There is a prominent cavity of the base of what is probably tooth 14 or 15.  IMPRESSION: Cavity at the base of tooth number 14 or 15.   Electronically Signed   By: Rozetta Nunnery M.D.   On: 04/14/2013 12:00   [place] Chest  04/13/2013   CLINICAL DATA:  Possible left breast abscess, status post antibiotic therapy  EXAM: CHEST ULTRASOUND  COMPARISON:  None.  FINDINGS: The lateral aspect of the left  breast and adjacent soft tissues were interrogated with grayscale ultrasound. Doppler interrogation was employed as well. There is no evidence of an abnormal fluid collection. No discrete abscess is demonstrated. The echotexture of the image parenchyma is relatively nonspecific in appearance. No hypervascular areas are demonstrated.  IMPRESSION: There is no evidence of an abscess in the area of the known wound in the lateral aspect of the left breast   Electronically Signed   By: David  [place]   On: 04/13/2013 15:44      Assessment/Plan: Stpehanie Andert Vandenbrink is a 62 y.o. 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  #1 Endocarditis of Mitral valve: --followup blood cultures --followup serologies --followup anti-cardiolipin abs, labs --if all cultures negative and we do not establish a cause for NBTE then would make sure that she receives 6 weeks of effective antibiotics --OK to place PICC today  #2 PIV pain: likely with some peripheral thrombophlebitis would apply warm compresses and monitor site  #3 Screening: checking HIV and hep panel is negative  #4 breast abscess: was appearing to be resolving though some area of induration, will follow as outpt  I think she can be DC tomorrow with vancomycin and rocephin with plan of 6 weeks of these abx  She will need weekly cbc, bmp and vancomycin faxed to my office at [phone]  Home infusion pharmacy will need to dose the vancomycin for goal trough of 15-20  I will Arrange followup in my clinic in the next one to 2 weeks.    LOS: 2 days   Alcide Evener 04/15/2013, 1:10 PM

## 2013-04-15 NOTE — Progress Notes (Signed)
ANTIBIOTIC CONSULT NOTE-Preliminary  Pharmacy Consult for  Vancomycin Indication:  infectious endocarditis   No Known Allergies  Dosing Weight:  83 kg   Vital Signs: Temp: 98.5 F (36.9 C) (12/07 1343) Temp src: Oral (12/07 1343) BP: 133/80 mmHg (12/07 1343) Pulse Rate: 77 (12/07 1343)  Labs:  Recent Labs  04/13/13 1425 04/14/13 0345 04/14/13 0937  WBC 4.3 3.9*  --   HGB 14.6 13.8  --   PLT 145* 151 148*  CREATININE 0.96 0.85  --     Estimated Creatinine Clearance: 69.6 ml/min (by C-G formula based on Cr of 0.85).   Recent Labs  04/15/13 1600  VANCOTROUGH 19.4     Medical History: Past Medical History  Diagnosis Date  . Hypertension   . Stroke   . History of rheumatic fever     as a child  . Hyperlipidemia   . Mitral stenosis   . OSA (obstructive sleep apnea)     mild per sleep study 2008  . Heart murmur   . Complication of anesthesia     difficult to wake up from anesthesia  . Pneumonia     double pneumonia once    Medications:  Scheduled:  . amLODipine  10 mg Oral Daily  . atorvastatin  80 mg Oral q1800  . cefTRIAXone (ROCEPHIN)  IV  2 g Intravenous Q12H  . clopidogrel  75 mg Oral Q breakfast  . fluconazole  100 mg Oral Daily  . heparin  5,000 Units Subcutaneous Q8H  . metoprolol succinate  25 mg Oral Daily  . sodium chloride  10-40 mL Intracatheter Q12H  . vancomycin  1,000 mg Intravenous Q12H   Infusions:   Anti-infectives   Start     Dose/Rate Route Frequency Ordered Stop   04/14/13 1000  fluconazole (DIFLUCAN) tablet 100 mg     100 mg Oral Daily 04/13/13 2219     04/13/13 1545  cefTRIAXone (ROCEPHIN) 2 g in dextrose 5 % 50 mL IVPB     2 g 100 mL/hr over 30 Minutes Intravenous Every 12 hours 04/13/13 1431     04/13/13 1530  vancomycin (VANCOCIN) IVPB 1000 mg/200 mL premix     1,000 mg 200 mL/hr over 60 Minutes Intravenous Every 12 hours 04/13/13 1514        Assessment:  Day # 3 Vancomycin and Ceftriaxone for presumed infectious  endocarditis.  Patient is afebrile, WBC 3.9.  Cultures negative to date.  Renal function stable.  Goal of Therapy:  Vancomycin trough level 15-20 mcg/ml  Plan:  1. Continue Vancomycin 1000 mg IV q 12 hours. Follow up SCr, UOP, cultures, clinical course and adjust as clinically indicated.   Akisha Sturgill, Craig Guess,  Pharm.D. 04/15/2013,5:30 PM

## 2013-04-15 NOTE — Progress Notes (Signed)
Peripherally Inserted Central Catheter/Midline Placement  The IV Nurse has discussed with the patient and/or persons authorized to consent for the patient, the purpose of this procedure and the potential benefits and risks involved with this procedure.  The benefits include less needle sticks, lab draws from the catheter and patient may be discharged home with the catheter.  Risks include, but not limited to, infection, bleeding, blood clot (thrombus formation), and puncture of an artery; nerve damage and irregular heat beat.  Alternatives to this procedure were also discussed.  PICC/Midline Placement Documentation  PICC / Midline Single Lumen [id number] PICC Right Basilic 43 cm 1 cm (Active)  Indication for Insertion or Continuance of Line Home intravenous therapies (PICC only) 04/15/2013  4:00 PM  Exposed Catheter (cm) 1 cm 04/15/2013  4:00 PM  Site Assessment Clean;Dry;Intact 04/15/2013  4:00 PM  Line Status Flushed;Saline locked;Blood return noted 04/15/2013  4:00 PM  Dressing Type Transparent 04/15/2013  4:00 PM  Dressing Status Clean;Dry;Intact 04/15/2013  4:00 PM  Dressing Intervention New dressing 04/15/2013  4:00 PM  Dressing Change Due 04/22/13 04/15/2013  4:00 PM       Gordan Payment 04/15/2013, 4:26 PM

## 2013-04-16 ENCOUNTER — Telehealth: Payer: Self-pay | Admitting: Family

## 2013-04-16 ENCOUNTER — Telehealth: Payer: Self-pay | Admitting: *Deleted

## 2013-04-16 LAB — CARDIOLIPIN ANTIBODIES, IGG, IGM, IGA: Anticardiolipin IgA: 26 APL U/mL — ABNORMAL HIGH (ref <22)

## 2013-04-16 MED ORDER — HEPARIN SOD (PORK) LOCK FLUSH 100 UNIT/ML IV SOLN
250.0000 [IU] | INTRAVENOUS | Status: DC | PRN
  Administered 2013-04-16: 250 [IU]
  Filled 2013-04-16: qty 3

## 2013-04-16 MED ORDER — DEXTROSE 5 % IV SOLN: 2.0000 g | Freq: Two times a day (BID) | INTRAVENOUS | Status: DC

## 2013-04-16 MED ORDER — VANCOMYCIN HCL IN DEXTROSE 1-5 GM/200ML-% IV SOLN: 1000.0000 mg | Freq: Two times a day (BID) | INTRAVENOUS | Status: DC

## 2013-04-16 MED ORDER — HEPARIN SOD (PORK) LOCK FLUSH 100 UNIT/ML IV SOLN
250.0000 [IU] | Freq: Every day | INTRAVENOUS | Status: DC
  Filled 2013-04-16: qty 3

## 2013-04-16 NOTE — Progress Notes (Signed)
    Subjective:  Denies CP or dyspnea   Objective:  Filed Vitals:   04/15/13 0414 04/15/13 1343 04/15/13 1957 04/16/13 0536  BP: 115/72 133/80 141/83 130/76  Pulse: 74 77 77 75  Temp: 98.5 F (36.9 C) 98.5 F (36.9 C) 99 F (37.2 C) 97.8 F (36.6 C)  TempSrc: Oral Oral Oral Oral  Resp: 15 16 18 18   Height:      Weight: 183 lb 14.4 oz (83.416 kg)   184 lb 9.6 oz (83.734 kg)  SpO2: 98% 95% 94% 97%    Intake/Output from previous day:  Intake/Output Summary (Last 24 hours) at 04/16/13 [id number] Last data filed at 04/16/13 [id number]  Gross per 24 hour  Intake    610 ml  Output    400 ml  Net    210 ml    Physical Exam: Physical exam: Well-developed well-nourished in no acute distress.  Skin is warm and dry.  HEENT is normal.  Neck is supple.  Chest is clear to auscultation with normal expansion. Implantable loop recorder site without evidence of infection. Cardiovascular exam is regular rate and rhythm. 1/6 systolic murmur apex. Abdominal exam nontender or distended. No masses palpated. Extremities show no edema. neuro grossly intact    Lab Results: Basic Metabolic Panel:  Recent Labs  04/13/13 1425 04/14/13 0345  NA 136 137  K 4.1 3.8  CL 100 102  CO2 21 22  GLUCOSE 81 95  BUN 27* 22  CREATININE 0.96 0.85  CALCIUM 9.9 9.4   CBC:  Recent Labs  04/13/13 1425 04/14/13 0345 04/14/13 0937  WBC 4.3 3.9*  --   HGB 14.6 13.8  --   HCT 45.8 41.6  --   MCV 85.9 84.0  --   PLT 145* 151 148*     Assessment/Plan:  1 question subacute bacterial endocarditis-previous transesophageal echocardiogram showed oscillating density on mitral valve and vegetation could not be excluded. Her blood cultures are negative to date. However she was on po antibiotics at the time they were drawn for small abscess left lateral chest area. Blood cultures could therefore be falsely negative. It is not clear that she definitively has endocarditis. However infectious disease has seen and  recommended 6 weeks of antibiotics. We will plan to repeat her echocardiogram in approximately 6 weeks. She will need repeat blood cultures once she completes her course of antibiotics. 2 recent CVA-question related to vegetation on mitral valve. However she has mitral stenosis and there was note of spontaneous contrast in her left atrial appendage at the time of her transesophageal echocardiogram. Implantable loop recorder is in place and if she has documented atrial fibrillation she will need long-term anticoagulation. Continue Plavix. 3 cerebrovascular disease-continue Plavix and statin. Followup carotid Dopplers November 2015.  Please schedule patient to see me in the office today for weeks following discharge.   [name] 04/16/2013, 6:52 AM

## 2013-04-16 NOTE — Progress Notes (Signed)
Pt. Has cpap from home. RT inspected pt. Cpap, no frayed wires or cords. Machine appears to be intact and is a new unit. Pt. Family member states she will help pt. Place on nasal mask. Family member states that pt. Wears cpap every night at home. RN is aware and has paged MD to make him aware that pt. Has home cpap.

## 2013-04-16 NOTE — Telephone Encounter (Signed)
Called patient in regards to her recent LINQ transmission.  All NSR.  Left the patient a message

## 2013-04-16 NOTE — Discharge Summary (Signed)
[facility]                                                                                   DEZARAY TOMBS, is a 62 y.o. female  DOB April 27, 1951  MRN [id number].  Admission date:  04/13/2013  Admitting Physician  No admitting provider for patient encounter.  Discharge Date:  04/16/2013   Primary MD  [name], NP  Recommendations for primary care physician for things to follow:   Monitor vancomycin levels, BMP   Admission Diagnosis  Endocarditis  Discharge Diagnosis   endocarditis  Principal Problem:   Acute endocarditis, unspecified Active Problems:   HTN (hypertension)   History of rheumatic fever   Mitral stenosis   Stroke   S/P aortic valvotomy 2008   Moderate to severe pulmonary hypertension   Hyperlipidemia   Cerebrovascular disease   Endocarditis      Past Medical History  Diagnosis Date  . Hypertension   . Stroke   . History of rheumatic fever     as a child  . Hyperlipidemia   . Mitral stenosis   . OSA (obstructive sleep apnea)     mild per sleep study 2008  . Heart murmur   . Complication of anesthesia     difficult to wake up from anesthesia  . Pneumonia     double pneumonia once    Past Surgical History  Procedure Laterality Date  . Cardiac surgery  2008    "balloon surgery at [facility]"  . Abdominal hysterectomy    . Colonoscopy w/ polypectomy    . Appendectomy  1989    appendix ruptured,had peritonitis  . Tee without cardioversion N/A 04/10/2013    Procedure: TRANSESOPHAGEAL ECHOCARDIOGRAM (TEE);  Surgeon: [name], MD;  Location: [facility] ENDOSCOPY;  Service: Cardiovascular;  Laterality: N/A;  . Loop recorder implant  04-10-2013    MDT LinQ implanted by Dr Rayann Heman for cryptogenic stroke     Discharge Condition: Stable   Follow-up Information   Follow up with [name], NP. Schedule an appointment as soon as possible for a visit in 1 week.   Specialty:  Internal Medicine   Contact information:   [place] [address] [phone]       Follow up with Alcide Evener, MD. Schedule an appointment as soon as possible for a visit in 2 days.   Specialty:  Infectious Diseases   Contact information:   [address] [place] [address] [phone]         Consults obtained - ID   Discharge Medications      Medication List    STOP taking these medications       doxycycline 100 MG tablet  Commonly known as:  VIBRA-TABS      TAKE these medications       amLODipine 10 MG tablet  Commonly known as:  NORVASC  Take 1 tablet (10 mg total) by mouth daily.     atorvastatin 80 MG tablet  Commonly known as:  LIPITOR  Take 1 tablet (80 mg total) by mouth daily at 6 PM.  clopidogrel 75 MG tablet  Commonly known as:  PLAVIX  Take 1 tablet (75 mg total) by mouth daily with breakfast.     dextrose 5 % SOLN 50 mL with cefTRIAXone 2 G SOLR 2 g  Inject 2 g into the vein every 12 (twelve) hours. 6 week supply     metoprolol succinate 25 MG 24 hr tablet  Commonly known as:  TOPROL-XL  Take 1 tablet (25 mg total) by mouth daily.     vancomycin 1 GM/200ML Soln  Commonly known as:  VANCOCIN  Inject 200 mLs (1,000 mg total) into the vein every 12 (twelve) hours.         Diet and Activity recommendation: See Discharge Instructions below   Discharge Instructions     Follow with Primary MD [name], NP in 7 days   Get CBC, CMP, checked 7 days by Primary MD and again as instructed by your Primary MD. Get a 2 view Chest X ray done next visit if you had Pneumonia of Lung problems at the [place] reviewed and adjusted.  Please request your Prim.MD to go over all Hospital Tests and Procedure/Radiological results at the follow up, please get all Hospital records sent to your Prim MD by signing hospital release before you go home.  Activity: As tolerated with Full fall precautions use walker/cane &  assistance as needed   Diet:  Heart Healthy  For Heart failure patients - Check your Weight same time everyday, if you gain over 2 pounds, or you develop in leg swelling, experience more shortness of breath or chest pain, call your Primary MD immediately. Follow Cardiac Low Salt Diet and 1.8 lit/day fluid restriction.  Disposition Home    If you experience worsening of your admission symptoms, develop shortness of breath, life threatening emergency, suicidal or homicidal thoughts you must seek medical attention immediately by calling 911 or calling your MD immediately  if symptoms less severe.  You Must read complete instructions/literature along with all the possible adverse reactions/side effects for all the Medicines you take and that have been prescribed to you. Take any new Medicines after you have completely understood and accpet all the possible adverse reactions/side effects.   Do not drive and provide baby sitting services if your were admitted for syncope or siezures until you have seen by Primary MD or a Neurologist and advised to do so again.  Do not drive when taking Pain medications.    Do not take more than prescribed Pain, Sleep and Anxiety Medications  Special Instructions: If you have smoked or chewed Tobacco  in the last 2 yrs please stop smoking, stop any regular Alcohol  and or any Recreational drug use.  Wear Seat belts while driving.   Please note  You were cared for by a hospitalist during your hospital stay. If you have any questions about your discharge medications or the care you received while you were in the hospital after you are discharged, you can call the unit and asked to speak with the hospitalist on call if the hospitalist that took care of you is not available. Once you are discharged, your primary care physician will handle any further medical issues. Please note that NO REFILLS for any discharge medications will be authorized once you are discharged,  as it is imperative that you return to your primary care physician (or establish a relationship with a primary care physician if you do not have one) for your aftercare needs so that  they can reassess your need for medications and monitor your lab values.   Major procedures and Radiology Reports - PLEASE review detailed and final reports for all details, in brief -     Right arm single lumen PICC line placed 04/15/2013 .    Dg Orthopantogram  04/14/2013   CLINICAL DATA:  Endocarditis.  EXAM: ORTHOPANTOGRAM/PANORAMIC  COMPARISON:  Brain MRI dated 04/04/2013  FINDINGS: There are multiple missing teeth. There is sclerotic bone around the roots of teeth 27, 28, and 29 which is felt to be a benign bone island. There is a tiny area of mixed density at the tip of the root of tooth number 21 which I doubt represents a periapical abscess.  There is a prominent cavity of the base of what is probably tooth 14 or 15.  IMPRESSION: Cavity at the base of tooth number 14 or 15.   Electronically Signed   By: Rozetta Nunnery M.D.   On: 04/14/2013 12:00      CHEST ULTRASOUND   FINDINGS:  The lateral aspect of the left breast and adjacent soft tissues were interrogated with grayscale ultrasound. Doppler interrogation was employed as well. There is no evidence of an abnormal fluid collection. No discrete abscess is demonstrated. The echotexture of the image parenchyma is relatively nonspecific in appearance. No hypervascular areas are demonstrated.   IMPRESSION:  There is no evidence of an abscess in the area of the known wound in the lateral aspect of the left breast     Micro Results      Recent Results (from the past 240 hour(s))  MRSA PCR SCREENING     Status: None   Collection Time    04/06/13  5:08 PM      Result Value Range Status   MRSA by PCR NEGATIVE  NEGATIVE Final   Comment:            The GeneXpert MRSA Assay (FDA     approved for NASAL specimens     only), is one component of a      comprehensive MRSA colonization     surveillance program. It is not     intended to diagnose MRSA     infection nor to guide or     monitor treatment for     MRSA infections.  CULTURE, BLOOD (SINGLE)     Status: None   Collection Time    04/11/13  2:25 PM      Result Value Range Status   Preliminary Report Blood Culture received; No Growth to date;   Preliminary   Preliminary Report Culture will be held for 5 days before issuing   Preliminary   Preliminary Report a Final Negative report.   Preliminary  CULTURE, BLOOD (SINGLE)     Status: None   Collection Time    04/11/13  2:25 PM      Result Value Range Status   Preliminary Report Blood Culture received; No Growth to date;   Preliminary   Preliminary Report Culture will be held for 5 days before issuing   Preliminary   Preliminary Report a Final Negative report.   Preliminary  CULTURE, BLOOD (ROUTINE X 2)     Status: None   Collection Time    04/13/13  2:25 PM      Result Value Range Status   Specimen Description BLOOD LEFT ARM   Final   Special Requests BOTTLES DRAWN AEROBIC ONLY [place]   Final   Culture  Setup Time  Final   Value: 04/13/2013 21:04     Performed at [facility]   Culture     Final   Value:        BLOOD CULTURE RECEIVED NO GROWTH TO DATE CULTURE WILL BE HELD FOR 5 DAYS BEFORE ISSUING A FINAL NEGATIVE REPORT     Performed at [facility]   Report Status PENDING   Incomplete  CULTURE, BLOOD (ROUTINE X 2)     Status: None   Collection Time    04/13/13  2:40 PM      Result Value Range Status   Specimen Description BLOOD RIGHT HAND   Final   Special Requests BOTTLES DRAWN AEROBIC ONLY 5CC   Final   Culture  Setup Time     Final   Value: 04/13/2013 21:03     Performed at [facility]   Culture     Final   Value:        BLOOD CULTURE RECEIVED NO GROWTH TO DATE CULTURE WILL BE HELD FOR 5 DAYS BEFORE ISSUING A FINAL NEGATIVE REPORT     Performed at [facility]   Report Status  PENDING   Incomplete     History of present illness and  Hospital Course:     Kindly see H&P for history of present illness and admission details, please review complete Labs, Consult reports and Test reports for all details in brief GYDA BRICKER, is a 62 y.o. female, patient with history of  rheumatic heart fever, recent CVA with right-sided hemiparesis which is almost completely resolved, left breast abscess which is now healed, essential hypertension, who had her finding consistent with endocarditis/vegetation on the mitral valve on a TEE done on a previous hospital visit was admitted here from the ID office of Dr. Drucilla Schmidt for endocarditis workup. She had blood cultures drawn which are negative to date, she is currently afebrile and has had no further sequelae of CVA.    She has been kept on vancomycin along with Rocephin under the guidance of ID, she underwent PICC line placement in the evening of 04/15/2013 for prolonged IV vancomycin and Rocephin treatment. For now we are shooting for 6 weeks of treatment however patient will closely follow with Dr. Drucilla Schmidt who titrate the dose and duration as needed. She will also get a home health RN who drove vancomycin levels and forward them to Dr. Arlyss Queen office.     Patient recently had left breast abscess which has not completely healed. Ultrasound obtained this admission was unremarkable. She also has underlying history of essential hypertension, recent history of left MCA territory ischemic CVA, she will commence her home medications unchanged for her chronic medical problems.   Is completely symptom free this morning either to go home the    Today   Subjective:   Shanquilla Mancias today has no headache,no chest abdominal pain,no new weakness tingling or numbness, feels much better wants to go home today.    Objective:   Blood pressure 130/76, pulse 75, temperature 97.8 F (36.6 C), temperature source Oral, resp. rate 18, height 5\' 2"   (1.575 m), weight 83.734 kg (184 lb 9.6 oz), SpO2 97.00%.   Intake/Output Summary (Last 24 hours) at 04/16/13 1012 Last data filed at 04/16/13 0339  Gross per 24 hour  Intake    490 ml  Output    400 ml  Net     90 ml    Exam Awake Alert, Oriented *3, No new F.N deficits,  Normal affect [place].AT,PERRAL Supple Neck,No JVD, No cervical lymphadenopathy appriciated.  Symmetrical Chest wall movement, Good air movement bilaterally, CTAB RRR,No Gallops,Rubs or new Murmurs, No Parasternal Heave +ve B.Sounds, Abd Soft, Non tender, No organomegaly appriciated, No rebound -guarding or rigidity. No Cyanosis, Clubbing or edema, No new Rash or bruise him a right arm single lumen PICC line is placed 04/15/2013  Data Review   CBC w Diff: Lab Results  Component Value Date   WBC 3.9* 04/14/2013   HGB 13.8 04/14/2013   HCT 41.6 04/14/2013   PLT 148* 04/14/2013   LYMPHOPCT 31 04/06/2013   MONOPCT 8 04/06/2013   EOSPCT 3 04/06/2013   BASOPCT 1 04/06/2013    CMP: Lab Results  Component Value Date   NA 137 04/14/2013   K 3.8 04/14/2013   CL 102 04/14/2013   CO2 22 04/14/2013   BUN 22 04/14/2013   CREATININE 0.85 04/14/2013   CREATININE 0.86 08/30/2012   PROT 9.8* 04/13/2013   ALBUMIN 4.1 04/13/2013   BILITOT 0.7 04/13/2013   ALKPHOS 99 04/13/2013   AST 50* 04/13/2013   ALT 37* 04/13/2013  .   Total Time in preparing paper work, data evaluation and todays exam - 35 minutes  Thurnell Lose M.D on 04/16/2013 at 10:12 AM  [place]  [phone]

## 2013-04-16 NOTE — Telephone Encounter (Signed)
Please call pt and arrange a hospital follow up visit in 1 week.

## 2013-04-16 NOTE — Progress Notes (Signed)
PICC line flushed and prepped per IVT. [facility] to see patient. Discharge instructions provided. Patient transported via wheelchair to lobby for d/c home. Belongings with patient.Cindee Salt

## 2013-04-16 NOTE — Care Management Note (Signed)
    Page 1 of 2   04/16/2013     4:31:28 PM   CARE MANAGEMENT NOTE 04/16/2013  Patient:  Marie Malone, Marie Malone   Account Number:  [id number]  Date Initiated:  04/16/2013  Documentation initiated by:  Rion Catala  Subjective/Objective Assessment:   PT ADM ON 04/13/13 WITH ENDOCARDITIS.  PTA, PT LIVES AT HOME WITH HUSBAND AND DAUGHTER.     Action/Plan:   CM REFERRAL FOR HH NEEDS.  PT WILL REQUIRE 6 WEEKS IV ANTIBIOTICS AND HHPT AT DC.   Anticipated DC Date:  04/16/2013   Anticipated DC Plan:  [place]  CM consult      [facility] Choice  HOME HEALTH   Choice offered to / List presented to:  C-1 Patient   DME arranged  IV PUMP/EQUIPMENT      DME agency  [place] arranged  HH-1 RN  [place].   Status of service:  Completed, signed off Medicare Important Message given?   (If response is "NO", the following Medicare IM given date fields will be blank) Date Medicare IM given:   Date Additional Medicare IM given:    Discharge Disposition:  [place]  Per UR Regulation:  Reviewed for med. necessity/level of care/duration of stay  If discussed at [place] of Stay Meetings, dates discussed:    Comments:  04/16/13 Wade Sigala,RN,BSN [id number] PT [place].  MET WITH PT TO DISCUSS HOME ARRANGEMENTS.  PT STATES HUSBAND AND DTR CAN ASSIST WITH IV INFUSIONS.  REFERRAL TO [facility] FOR HH NEEDS, PER PT CHOICE. PT WILL HAVE COPAY WITH INSURANCE, BUT [facility] ALLOWING PT TO WORK OUT PAYMENT PLAN, TO PT/DTR'S SATISFACTION.  START OF CARE 04/16/13 PM.

## 2013-04-16 NOTE — Telephone Encounter (Signed)
Can you call this pt please and schedule appt

## 2013-04-17 ENCOUNTER — Encounter: Payer: Self-pay | Admitting: Family

## 2013-04-17 ENCOUNTER — Ambulatory Visit (INDEPENDENT_AMBULATORY_CARE_PROVIDER_SITE_OTHER): Payer: BC Managed Care – PPO | Admitting: Family

## 2013-04-17 VITALS — BP 120/70 | HR 80 | Temp 97.5°F | Resp 16 | Ht 63.0 in | Wt 186.0 lb

## 2013-04-17 DIAGNOSIS — I6350 Cerebral infarction due to unspecified occlusion or stenosis of unspecified cerebral artery: Secondary | ICD-10-CM

## 2013-04-17 DIAGNOSIS — I10 Essential (primary) hypertension: Secondary | ICD-10-CM

## 2013-04-17 DIAGNOSIS — I639 Cerebral infarction, unspecified: Secondary | ICD-10-CM

## 2013-04-17 DIAGNOSIS — I38 Endocarditis, valve unspecified: Secondary | ICD-10-CM

## 2013-04-17 DIAGNOSIS — I4891 Unspecified atrial fibrillation: Secondary | ICD-10-CM

## 2013-04-17 DIAGNOSIS — E785 Hyperlipidemia, unspecified: Secondary | ICD-10-CM

## 2013-04-17 LAB — CULTURE, BLOOD (SINGLE): Organism ID, Bacteria: NO GROWTH

## 2013-04-17 NOTE — Progress Notes (Signed)
Subjective:    Patient ID: Marie Malone, female    DOB: 04-Apr-1951, 62 y.o.   MRN: [id number]  HPI  Marie Malone is a 62 yr old female who presents today for hospital follow up. Hospital records are reviewed. She was admitted initially on 04/06/13 with right sided weakness and slurred speech.  Daughter noted right sided facial weakness.  CT head noted evolving infarct in the distribution of the LMCA and the left parietal lobe. TEE was performed as part of her stroke evaluation on 12/2.  Note was made of possible vegetation on the anterior MV leaflet. She was started on plavix and a statin during her hospitalization.    She has a referral for [facility] OT.  But she went back to the hospital. She has a Higher education careers adviser from [facility].  She has regained full speech. Right arm is still numb.    Cardiology ([place]shaw) and ID Tommy Medal) were consulted.  Blood cultures were drawn. 12/5 blood cultures remain NGTD. 12/3 blood cultures neg x 2.  She was started on ceftriaxone 2g iv daily along with vancomycin 200mg  every 12 hrs. Daughter has been trained to administer iv abx under supervision of [facility].   Review of Systems See HPI  Past Medical History  Diagnosis Date  . Hypertension   . Stroke   . History of rheumatic fever     as a child  . Hyperlipidemia   . Mitral stenosis   . OSA (obstructive sleep apnea)     mild per sleep study 2008  . Heart murmur   . Complication of anesthesia     difficult to wake up from anesthesia  . Pneumonia     double pneumonia once    History   Social History  . Marital Status: Married    Spouse Name: N/A    Number of Children: 2  . Years of Education: N/A   Occupational History  . Not on file.   Social History Main Topics  . Smoking status: Former Smoker -- 1.00 packs/day for 2 years  . Smokeless tobacco: Not on file  . Alcohol Use: No     Comment: drank years ago when young  . Drug Use: No  . Sexual Activity: Not on file   Other Topics  Concern  . Not on file   Social History Narrative   Retired Archivist   Married   She has 2 grown children-   Daughter lives with her   Son lives in [place]   6 grandchildren    Past Surgical History  Procedure Laterality Date  . Cardiac surgery  2008    "balloon surgery at [facility]"  . Abdominal hysterectomy    . Colonoscopy w/ polypectomy    . Appendectomy  1989    appendix ruptured,had peritonitis  . Tee without cardioversion N/A 04/10/2013    Procedure: TRANSESOPHAGEAL ECHOCARDIOGRAM (TEE);  Surgeon: [name], MD;  Location: [facility] ENDOSCOPY;  Service: Cardiovascular;  Laterality: N/A;  . Loop recorder implant  04-10-2013    MDT LinQ implanted by Dr Rayann Heman for cryptogenic stroke    Family History  Problem Relation Age of Onset  . Diabetes Mother   . Stroke Mother   . Diabetes Father   . Heart disease Father     CHF  . Cancer Father     kidney  . Diabetes Sister   . Diabetes Brother   . Hypertension Daughter  No Known Allergies  Current Outpatient Prescriptions on File Prior to Visit  Medication Sig Dispense Refill  . amLODipine (NORVASC) 10 MG tablet Take 1 tablet (10 mg total) by mouth daily.  30 tablet  5  . atorvastatin (LIPITOR) 80 MG tablet Take 1 tablet (80 mg total) by mouth daily at 6 PM.  30 tablet  0  . clopidogrel (PLAVIX) 75 MG tablet Take 1 tablet (75 mg total) by mouth daily with breakfast.  30 tablet  0  . dextrose 5 % SOLN 50 mL with cefTRIAXone 2 G SOLR 2 g Inject 2 g into the vein every 12 (twelve) hours. 6 week supply  60 ampule  1  . metoprolol succinate (TOPROL-XL) 25 MG 24 hr tablet Take 1 tablet (25 mg total) by mouth daily.  30 tablet  5  . vancomycin (VANCOCIN) 1 GM/200ML SOLN Inject 200 mLs (1,000 mg total) into the vein every 12 (twelve) hours.  4000 mL  1   No current facility-administered medications on file prior to visit.    BP 120/70  Pulse 80  Temp(Src) 97.5 F (36.4 C) (Oral)  Resp 16  Ht 5\' 3"  (1.6 m)   Wt 186 lb (84.369 kg)  BMI 32.96 kg/m2  SpO2 97%       Objective:   Physical Exam  Constitutional: She is oriented to person, place, and time. She appears well-developed and well-nourished. No distress.  HENT:  Head: Normocephalic and atraumatic.  Cardiovascular: Normal rate and regular rhythm.   No murmur heard. Pulmonary/Chest: Effort normal and breath sounds normal. No respiratory distress. She has no wheezes. She has no rales. She exhibits no tenderness.  Musculoskeletal: She exhibits no edema.  Neurological: She is alert and oriented to person, place, and time.  Bilateral UE/LE strength is 5/5 + facial symmetry Bilateral UE/LE strength is 5/5  Psychiatric: She has a normal mood and affect. Her behavior is normal. Judgment and thought content normal.          Assessment & Plan:

## 2013-04-17 NOTE — Patient Instructions (Signed)
Please follow up in 1 month. Call if you have problems or concerns.

## 2013-04-17 NOTE — Progress Notes (Signed)
Pre visit review using our clinic review tool, if applicable. No additional management support is needed unless otherwise documented below in the visit note. 

## 2013-04-18 ENCOUNTER — Ambulatory Visit: Payer: BC Managed Care – PPO | Admitting: Infectious Disease

## 2013-04-18 LAB — CHLAMYDIA ANTIBODIES, IGG
Chlamydia Pneumoniae IgG: <1:64 {titer}
Chlamydia Psittaci IgG: <1:64 {titer}
Chlamydia trachomatis IgM: <1:10 {titer}

## 2013-04-18 LAB — BARTONELLA ANTIBODY PANEL

## 2013-04-18 LAB — BARTONELLA ANITBODY PANEL: B henselae IgG: NEGATIVE

## 2013-04-18 NOTE — Telephone Encounter (Signed)
All set- pt was seen yesterday.  Thanks.

## 2013-04-19 ENCOUNTER — Encounter: Payer: Self-pay | Admitting: Cardiology

## 2013-04-19 ENCOUNTER — Encounter: Payer: Self-pay | Admitting: Internal Medicine

## 2013-04-19 ENCOUNTER — Ambulatory Visit (INDEPENDENT_AMBULATORY_CARE_PROVIDER_SITE_OTHER): Payer: BC Managed Care – PPO | Admitting: Infectious Disease

## 2013-04-19 ENCOUNTER — Ambulatory Visit (INDEPENDENT_AMBULATORY_CARE_PROVIDER_SITE_OTHER): Payer: BC Managed Care – PPO | Admitting: Internal Medicine

## 2013-04-19 ENCOUNTER — Ambulatory Visit (INDEPENDENT_AMBULATORY_CARE_PROVIDER_SITE_OTHER): Payer: BC Managed Care – PPO | Admitting: *Deleted

## 2013-04-19 ENCOUNTER — Encounter: Payer: Self-pay | Admitting: Infectious Disease

## 2013-04-19 VITALS — BP 126/79 | HR 93 | Temp 98.4°F | Wt 187.0 lb

## 2013-04-19 VITALS — BP 137/79 | HR 90

## 2013-04-19 DIAGNOSIS — R110 Nausea: Secondary | ICD-10-CM

## 2013-04-19 DIAGNOSIS — I38 Endocarditis, valve unspecified: Secondary | ICD-10-CM

## 2013-04-19 DIAGNOSIS — I480 Paroxysmal atrial fibrillation: Secondary | ICD-10-CM | POA: Insufficient documentation

## 2013-04-19 DIAGNOSIS — N61 Mastitis without abscess: Secondary | ICD-10-CM

## 2013-04-19 DIAGNOSIS — I4891 Unspecified atrial fibrillation: Secondary | ICD-10-CM

## 2013-04-19 DIAGNOSIS — I6350 Cerebral infarction due to unspecified occlusion or stenosis of unspecified cerebral artery: Secondary | ICD-10-CM

## 2013-04-19 DIAGNOSIS — N611 Abscess of the breast and nipple: Secondary | ICD-10-CM

## 2013-04-19 DIAGNOSIS — Z959 Presence of cardiac and vascular implant and graft, unspecified: Secondary | ICD-10-CM | POA: Insufficient documentation

## 2013-04-19 DIAGNOSIS — R197 Diarrhea, unspecified: Secondary | ICD-10-CM

## 2013-04-19 DIAGNOSIS — I639 Cerebral infarction, unspecified: Secondary | ICD-10-CM

## 2013-04-19 LAB — HIV-1 RNA QUANT-NO REFLEX-BLD
HIV 1 RNA Quant: <20 copies/mL (ref <20)
HIV-1 RNA Quant, Log: <1.3 {Log} (ref <1.30)

## 2013-04-19 LAB — MDC_IDC_ENUM_SESS_TYPE_INCLINIC: Zone Setting Detection Interval: 2000 ms

## 2013-04-19 LAB — CULTURE, BLOOD (ROUTINE X 2)

## 2013-04-19 LAB — Q FEVER ABS IGG, IGM W/ REFLEX TITER
Q fever IgG phase II screen 1: NEGATIVE
Q fever IgM phase I screen 1: NEGATIVE

## 2013-04-19 MED ORDER — WARFARIN SODIUM 5 MG PO TABS: 5.0000 mg | ORAL_TABLET | Freq: Every day | ORAL | Status: DC

## 2013-04-19 MED ORDER — ONDANSETRON HCL 4 MG PO TABS: 4.0000 mg | ORAL_TABLET | Freq: Three times a day (TID) | ORAL | Status: DC | PRN

## 2013-04-19 MED ORDER — DEXTROSE 5 % IV SOLN: 2.0000 g | INTRAVENOUS | Status: DC

## 2013-04-19 NOTE — Patient Instructions (Addendum)
Your physician has recommended you make the following change in your medication:  1) Start Coumadin 5mg  2) Stop Plavix  Your physician recommends that you schedule a follow-up appointment on Monday with Coumadin clinic to establish yourself with them.   No follow up with Dr Caryl Comes is necessary

## 2013-04-19 NOTE — Progress Notes (Signed)
Subjective:    Patient ID: Marie Malone, female    DOB: 1950-09-27, 62 y.o.   MRN: [id number]  Diarrhea  This is a new problem. The current episode started in the past 7 days. The problem occurs 2 to 4 times per day. The problem has been gradually worsening. The stool consistency is described as watery. Pertinent negatives include no abdominal pain, arthralgias, chills, coughing, fever, myalgias or vomiting. The symptoms are aggravated by diary products. Risk factors include recent antibiotic use and suspect food intake. She has tried nothing for the symptoms. There is no history of bowel resection, inflammatory bowel disease, irritable bowel syndrome, malabsorption, a recent abdominal surgery or short gut syndrome.    62 year old recently admitted [facility] with [facility]A. She was found to have a Moderate-sized acute posterior left MCA infarct. Edema without  significant mass effect. No associated hemorrhage MRA showed an Occluded left MCA posterior M2 branch about 10 mm beyond its origin.  During the hospitalization she also had workup with TEE which showed :  mild mitral stenosis and mild mitral regurgitation. The left atrium was dilated and there was spontaneous contrast in the left atrial appendage but no thrombus. There was mild spontaneous contrast in the right atrium. There was a small oscillating density on the anterior mitral valve leaflet of uncertain etiology and vegetation could not be excluded. There was a small oscillating density on the aortic valve felt most likely to be Lambl's excrescence  Patient had a set of blood cultures drawn and was dc to home. [name] called medue to his concerns re the area that appeared c/w a vegetation in the mitral valve.   He noted that pt had had a recent course of doxycycline begun that was finished yesterday and which likely will obfuscate the blood cultures.  Pt has had a loop recorder placed yesterday to look for arrythmias including atrial  fibrillation.     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  I arranged to see the pt in my office in consultation  And then arrange for her to be admitted to the hospital where we perform blood cultures and check serologies for Coxiella Bartonella and Chlamydia.  We started the patient on vancomycin dose for trough of 15-20 and on ceftriaxone which we dosed at 2 g every 12 hours to achieve maximal central nervous system penetration.  When I last saw the patient on Sunday she appear to be doing well but after having gone home she has developed loose stools at least 3 times a day whenever she. The stool comes out quickly and she's been wearing depends to protect herself from soiling her close. She has not tried antimotility agents she feels fatigued and also has nausea but no vomiting. She has been taking a probiotic. And labs have also been recently pertinent for elevated sedimentation rate. She has not been known to have a fever but she has not been checking her temperatures at home.  She has no history of having had a hypercoagulable state no history of any miscarriages she has had no DVTs or pulmonary embolism. She has no family history for connective tissue disease.  She did have the colonoscopy although I believe was more than 10 years ago done in [facility] with a polypectomy. She has a few false teeth but not had any recent dental work.  [place]  [place]. There are no farm animals nearby she has a small young cat but has not been around the birth of this catheter any other animals there are no pets birds or recently deceased pet birds.  I thoroughly reviewed her history and her past medical history surgical social history family history and her clinical picture. I do not see away around not treating her for infectious endocarditis see below.  Review of Systems    Constitutional: Negative for fever, chills, diaphoresis, activity change, appetite change, fatigue and unexpected weight change.  HENT: Negative for congestion, dental problem, rhinorrhea, sinus pressure, sneezing, sore throat and trouble swallowing.   Eyes: Negative for photophobia and visual disturbance.  Respiratory: Negative for cough, chest tightness, shortness of breath, wheezing and stridor.   Cardiovascular: Negative for chest pain, palpitations and leg swelling.  Gastrointestinal: Positive for nausea and diarrhea. Negative for vomiting, abdominal pain, constipation, blood in stool, abdominal distention and anal bleeding.  Genitourinary: Negative for dysuria, hematuria, flank pain and difficulty urinating.  Musculoskeletal: Negative for arthralgias, back pain, gait problem, joint swelling and myalgias.  Skin: Positive for wound. Negative for color change, pallor and rash.  Neurological: Positive for facial asymmetry and weakness. Negative for dizziness, tremors, speech difficulty and light-headedness.  Hematological: Negative for adenopathy. Does not bruise/bleed easily.  Psychiatric/Behavioral: Negative for behavioral problems, confusion, sleep disturbance, dysphoric mood, decreased concentration and agitation.       Objective:   Physical Exam  Constitutional: She is oriented to person, place, and time. She appears well-developed and well-nourished. No distress.  HENT:  Head: Normocephalic and atraumatic.  Mouth/Throat: Oropharynx is clear and moist.    Eyes: Conjunctivae and EOM are normal. No scleral icterus.  Neck: Normal range of motion. Neck supple. No JVD present.  Cardiovascular: Normal rate, regular rhythm and normal heart sounds.  Exam reveals no gallop and no friction rub.   No murmur heard. Pulmonary/Chest: Effort normal and breath sounds normal. No respiratory distress. She has no wheezes. She has no rales. She exhibits no tenderness.  Abdominal: She exhibits no  distension. There is tenderness. There is no rebound and no guarding.  Musculoskeletal: She exhibits no edema and no tenderness.  Lymphadenopathy:    She has no cervical adenopathy.  Neurological: She is alert and oriented to person, place, and time. No cranial nerve deficit. She exhibits normal muscle tone. Coordination normal.  Her strength on right UE is less vigorous than left but still 5 out of 5  Skin: Skin is warm and dry. She is not diaphoretic. No erythema. No pallor.     She has a few areas of linear hyperpigmentation on 2 of her toenails that are of uncertain acuity. See picture  Her hands had no splinter hemorrhages Janeway lesions or Osler nodes.  Psychiatric: She has a normal mood and affect. Her behavior is normal. Judgment and thought content normal.                  Assessment & Plan:   #1 Rule out Mitral valve endocarditis vs nonbacterial thrombotic endocarditis. I  --Her blood cultures on doxycycline and her blood cultures after doxycycline and a continued have been negative.  --Continue ceftriaxone but reduce dose to 2 g daily due to her GI intolerance.  Continue vancomycin dose for trough of 15-20.  Finish 4 weeks of ceftriaxone and finished 6 weeks of vancomycin.  Check surveillance cultures 2 weeks after completing antibiotics and recheck serologies for Bartonella Coxiella  and Chlamydia) were not targeting these agents with current drugs).  # 2 Diarrhea: Sounds to be antibiotic related. We'll reduce dose of ceftriaxone. If she has worsening of her diarrhea will check C. difficile PCR.  I spent greater than 20 minutes with the patient including greater than 50% of time in face to face counsel of the patient and in coordination of their care.   #3 nausea malaise: We'll give her Zofran as needed for nausea.  #4 ? Atrial fibrillation:  Pt with implantable loop recorder to look for this: My understanding is that this device is simply in the sub q  tissue and muscle rather than in endovascular space  #5  MRSA abscess: presumptive and highly likely and she had prior lesion on her chin, this is to be resolving.

## 2013-04-19 NOTE — Progress Notes (Signed)
      Patient Care Team: Debbrah Alar, NP as PCP - General (Internal Medicine)   HPI  Marie Malone is a 62 y.o. female Seen today because her LINQ monitor implanted for cryptogenic stroke as demonstrated atrial fibrillation.  She has a history of mitral stenosis for which underwent.tuboplasty 2008 and most recent echocardiogram demonstrated mild mitral stenosis. She has some carotid obstructive disease. She by atrial enlargement.  She is being treated for endocarditis   Past Medical History  Diagnosis Date  . Hypertension   . Stroke   . History of rheumatic fever     as a child  . Hyperlipidemia   . Mitral stenosis   . OSA (obstructive sleep apnea)     mild per sleep study 2008  . Heart murmur   . Complication of anesthesia     difficult to wake up from anesthesia  . Pneumonia     double pneumonia once    Past Surgical History  Procedure Laterality Date  . Cardiac surgery  2008    "balloon surgery at [facility]"  . Abdominal hysterectomy    . Colonoscopy w/ polypectomy    . Appendectomy  1989    appendix ruptured,had peritonitis  . Tee without cardioversion N/A 04/10/2013    Procedure: TRANSESOPHAGEAL ECHOCARDIOGRAM (TEE);  Surgeon: [name], MD;  Location: [facility] ENDOSCOPY;  Service: Cardiovascular;  Laterality: N/A;  . Loop recorder implant  04-10-2013    MDT LinQ implanted by Dr Rayann Heman for cryptogenic stroke    Current Outpatient Prescriptions  Medication Sig Dispense Refill  . amLODipine (NORVASC) 10 MG tablet Take 1 tablet (10 mg total) by mouth daily.  30 tablet  5  . atorvastatin (LIPITOR) 80 MG tablet Take 1 tablet (80 mg total) by mouth daily at 6 PM.  30 tablet  0  . clopidogrel (PLAVIX) 75 MG tablet Take 1 tablet (75 mg total) by mouth daily with breakfast.  30 tablet  0  . dextrose 5 % SOLN 50 mL with cefTRIAXone 2 G SOLR 2 g Inject 2 g into the vein daily. 6 week supply  60 ampule  1  . metoprolol succinate (TOPROL-XL) 25 MG 24 hr tablet Take 1  tablet (25 mg total) by mouth daily.  30 tablet  5  . ondansetron (ZOFRAN) 4 MG tablet Take 1 tablet (4 mg total) by mouth every 8 (eight) hours as needed for nausea or vomiting.  120 tablet  4  . vancomycin (VANCOCIN) 1 GM/200ML SOLN Inject 200 mLs (1,000 mg total) into the vein every 12 (twelve) hours.  4000 mL  1   No current facility-administered medications for this visit.    No Known Allergies  Review of Systems negative except from HPI and PMH  Physical Exam into during the  BP 137/79  Pulse 90  Well developed and well nourished in no acute distress HENT normal E scleral and icterus clear Neck Supple JVP flat; carotids brisk and full Clear to ausculation irr egular rate and rhythm, no murmurs gallops or rub Soft with active bowel sounds No clubbing cyanosis no Edema Alert and oriented, grossly normal motor and sensory function Skin Warm and Dry  ECG demonstrated atrial fibrillation at 90 Intervals-/07/36  Assessment and  Plan

## 2013-04-19 NOTE — Assessment & Plan Note (Signed)
She is now identified with atrial fibrillation in the setting of prior mitral stenosis status post valvuloplasty. This constitutes valvular atrial fibrillation and hence she is not a candidate for a NOAC. She has previously been on Coumadin so we will stop her Plavix and resume warfarin and have her follow up with the Coumadin clinic next week. She previously had been on about 5 mg a day so we will start it at that dose. She is aware of the risks. I reviewed this with [name]

## 2013-04-19 NOTE — Assessment & Plan Note (Signed)
Will need to be removed

## 2013-04-19 NOTE — Progress Notes (Signed)
Loop check in clinic (wound check).  Pt with 0 tachy episodes; 0 brady episodes; 0 asystole. 2 symptom episodes---no arrhythmia present. 168 AF episodes(22.6%)---max dur. 1 hour, Max V 150, Max Avg V 109---AF noted (implanted for CVA). Episodes were appropriate/undersensing/oversensing.  Plan will follow up with [facility] today to discuss anticoagulation.

## 2013-04-20 ENCOUNTER — Telehealth: Payer: Self-pay | Admitting: Surgery

## 2013-04-20 ENCOUNTER — Telehealth: Payer: Self-pay | Admitting: Family

## 2013-04-20 NOTE — Telephone Encounter (Addendum)
Message copied by Gena Fray on Fri Apr 20, 2013 11:47 AM ------      Message from: Denman George      Created: Thu Apr 19, 2013  6:41 PM      Regarding: RE: ? follow up      Contact: [place], I noted this statement in Dr. Clydene Fake consult note from 04/10/13: " F/u Carotid Doppler in 6 mos to assess incidental  60-79% right internal carotid artery stenosis ".   This may be scheduled in his office.  We did not consult on her with this recent hospitalization.  I couldn't tell if we have ever seen her.              ----- Message -----         From: Gena Fray         Sent: 04/19/2013   9:41 AM           To: Sherrye Payor, RN      Subject: ? follow up                                              [name],            Marie Lipfords daughter Marie Malone (pronounced Jen-ay) called to question follow up. She states that her mothers discharge papers instruct her to see our office. I cannot locate any information in EPIC referring to our office. Do you mind looking her up, at your convenience,  to see if I have missed something?             Thank you,      Hinton Dyer       ------        04/20/13: I spoke with [name] to notify that we were not consulted in the hospital, and our records indicate that Dr Leonie Man is following up with her. I advised to speak with either his office or Dr Sudie Bailey office at their post hosp follow up appts. If a referral is needed to Vascular Surgery, we would be happy to see her once a referral has been made. Marie Malone expressed understanding and will speak with [place] or Sethi and get back in touch if needed, dpm

## 2013-04-20 NOTE — Telephone Encounter (Signed)
Message copied by Debbrah Alar on Fri Apr 20, 2013 12:23 PM ------      Message from: Tommy Medal, CORNELIUS N      Created: Fri Apr 20, 2013 10:52 AM       They should be sending them to me and their pharmacy should be adjusting the dose      ----- Message -----         From: Debbrah Alar, NP         Sent: 04/19/2013   9:09 PM           To: Truman Hayward, MD            Dr. Tommy Medal,            Will you be following her vanc trough levels through [facility] or should I be following up on these?            Thanks,            Narada Uzzle       ------

## 2013-04-22 ENCOUNTER — Telehealth: Payer: Self-pay | Admitting: Family

## 2013-04-22 DIAGNOSIS — E785 Hyperlipidemia, unspecified: Secondary | ICD-10-CM

## 2013-04-22 DIAGNOSIS — I639 Cerebral infarction, unspecified: Secondary | ICD-10-CM | POA: Insufficient documentation

## 2013-04-22 NOTE — Assessment & Plan Note (Signed)
BP Readings from Last 3 Encounters:  04/19/13 137/79  04/19/13 126/79  04/17/13 120/70   BP stable on current meds.  Continue same.

## 2013-04-22 NOTE — Assessment & Plan Note (Signed)
Rate stable on beta blocker. On coumadin, management per coumadin clinic.

## 2013-04-22 NOTE — Telephone Encounter (Signed)
I would like her to complete FLP/LFT in 1 month please dx is hyperlipidemia.

## 2013-04-22 NOTE — Assessment & Plan Note (Addendum)
Coumadin, strict control of blood pressure, lipids.

## 2013-04-22 NOTE — Assessment & Plan Note (Signed)
IV abx per home health.  ID to manage dosing/management of abx.

## 2013-04-22 NOTE — Assessment & Plan Note (Signed)
Lab Results  Component Value Date   LDLCALC 114* 04/07/2013   Was started on atorvastatin during hospitalization.

## 2013-04-23 ENCOUNTER — Encounter: Payer: Self-pay | Admitting: Internal Medicine

## 2013-04-23 ENCOUNTER — Telehealth: Payer: Self-pay | Admitting: *Deleted

## 2013-04-23 ENCOUNTER — Ambulatory Visit (INDEPENDENT_AMBULATORY_CARE_PROVIDER_SITE_OTHER): Payer: BC Managed Care – PPO | Admitting: *Deleted

## 2013-04-23 DIAGNOSIS — I4891 Unspecified atrial fibrillation: Secondary | ICD-10-CM

## 2013-04-23 DIAGNOSIS — I639 Cerebral infarction, unspecified: Secondary | ICD-10-CM

## 2013-04-23 DIAGNOSIS — I6350 Cerebral infarction due to unspecified occlusion or stenosis of unspecified cerebral artery: Secondary | ICD-10-CM

## 2013-04-23 LAB — POCT INR: INR: 2.1

## 2013-04-23 NOTE — Telephone Encounter (Signed)
[facility] nurse notified Marie Malone

## 2013-04-23 NOTE — Patient Instructions (Signed)

## 2013-04-23 NOTE — Telephone Encounter (Signed)
Can you make sure they do a C difficile PCR?

## 2013-04-23 NOTE — Telephone Encounter (Signed)
Call from [place], nurse with [facility], patient is c/o worsening diarrhea, several loose stools a day. Per Dr. Derek Mound office note if this continued he wanted to check for C-diff. Nurse will go out and obtain a stool sample.  Myrtis Hopping

## 2013-04-23 NOTE — Telephone Encounter (Signed)
Notified pt and she voices understanding. Lab order entered. 

## 2013-04-24 ENCOUNTER — Ambulatory Visit (INDEPENDENT_AMBULATORY_CARE_PROVIDER_SITE_OTHER): Payer: BC Managed Care – PPO | Admitting: Emergency Medicine

## 2013-04-24 ENCOUNTER — Encounter: Payer: Self-pay | Admitting: Emergency Medicine

## 2013-04-24 VITALS — BP 160/100 | HR 95 | Ht 62.0 in | Wt 184.9 lb

## 2013-04-24 DIAGNOSIS — I2789 Other specified pulmonary heart diseases: Secondary | ICD-10-CM

## 2013-04-24 DIAGNOSIS — I272 Pulmonary hypertension, unspecified: Secondary | ICD-10-CM

## 2013-04-24 NOTE — Assessment & Plan Note (Signed)
PAH identified by echocardiogram in her hospitalization. Suspect that this relates to her long-standing mitral valve disease and now to the contribution of her endocarditis. At this point it would seem to be most important that she complete her antibiotic therapy. She has a followup echocardiogram planned and then discuss with [name] in cardiology. Depending on her pulmonary pressures and the suspected contributions to her pulmonary hypertension we may decide to evaluate her further or other secondary causes. I will defer to [name] regarding the need or timing for this. The patient will call me as needed or for any questions

## 2013-04-24 NOTE — Patient Instructions (Signed)
Please continue your antibiotics as directed by Dr Tommy Medal Get your echocardiogram as planned and follow with Dr [name] Dr [name] and Dr Lamonte Sakai will speak if there is any reason to pursue pulmonary hypertension or a breathing problem.  Call our office with any questions or concerns.

## 2013-04-24 NOTE — Progress Notes (Signed)
Subjective:    Patient ID: Marie Malone, female    DOB: February 22, 1951, 62 y.o.   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 She went home on IV abx to complete 6 weeks. Part of her eval included TTE and TEE, showed some evidence PAH. No ASD was seen. She follows here post-hospital.    Review of Systems  Constitutional: Negative for fever and unexpected weight change.  HENT: Positive for postnasal drip and trouble swallowing. Negative for congestion, dental problem, ear pain, nosebleeds, rhinorrhea, sinus pressure, sneezing and sore throat.   Eyes: Negative for redness and itching.  Respiratory: Positive for cough and shortness of breath. Negative for chest tightness and wheezing.   Cardiovascular: Negative for palpitations and leg swelling.  Gastrointestinal: Negative for nausea and vomiting.  Genitourinary: Negative for dysuria.  Musculoskeletal: Negative for joint swelling.  Skin: Negative for rash.  Neurological: Negative for headaches.  Hematological: Bruises/bleeds easily.  Psychiatric/Behavioral: Negative for dysphoric mood. The patient is nervous/anxious.     Past Medical History  Diagnosis Date  . Hypertension   . Stroke   . History of rheumatic fever     as a child  . Hyperlipidemia   . Mitral stenosis   . OSA (obstructive sleep apnea)     mild per sleep study 2008  . Heart murmur   . Complication of anesthesia     difficult to wake up from anesthesia  . Pneumonia     double pneumonia once     Family History  Problem Relation Age of Onset  . Diabetes Mother   . Stroke Mother   . Diabetes Father   . Heart disease Father     CHF  . Cancer Father     kidney  . Diabetes Sister   . Diabetes Brother   . Hypertension Daughter      History   Social History  . Marital Status: Married    Spouse Name: N/A    Number of Children: 2  . Years of Education: N/A   Occupational History   . Not on file.   Social History Main Topics  . Smoking status: Former Smoker -- 1.00 packs/day for 2 years    Types: Cigarettes    Quit date: 05/11/1975  . Smokeless tobacco: Not on file  . Alcohol Use: No     Comment: drank years ago when young  . Drug Use: No  . Sexual Activity: Not on file   Other Topics Concern  . Not on file   Social History Narrative   Retired Archivist   Married   She has 2 grown children-   Daughter lives with her   Son lives in [place]   6 grandchildren     No Known Allergies   Outpatient Prescriptions Prior to Visit  Medication Sig Dispense Refill  . amLODipine (NORVASC) 10 MG tablet Take 1 tablet (10 mg total) by mouth daily.  30 tablet  5  . atorvastatin (LIPITOR) 80 MG tablet Take 1 tablet (80 mg total) by mouth daily at 6 PM.  30 tablet  0  . dextrose 5 % SOLN 50 mL with cefTRIAXone 2 G SOLR 2 g Inject 2 g into the vein daily. 6 week supply  60 ampule  1  . metoprolol succinate (TOPROL-XL) 25 MG 24 hr tablet Take 1 tablet (25 mg total) by mouth daily.  30 tablet  5  . ondansetron (ZOFRAN) 4 MG tablet Take 1 tablet (4 mg total) by mouth every 8 (eight) hours as needed for nausea or vomiting.  120 tablet  4  . vancomycin (VANCOCIN) 1 GM/200ML SOLN Inject 200 mLs (1,000 mg total) into the vein every 12 (twelve) hours.  4000 mL  1  . warfarin (COUMADIN) 5 MG tablet Take 1 tablet (5 mg total) by mouth daily.  30 tablet  11   No facility-administered medications prior to visit.        Objective:   Physical Exam Filed Vitals:   04/24/13 1427  BP: 160/100  Pulse: 95  Height: 5\' 2"  (1.575 m)  Weight: 184 lb 14.4 oz (83.87 kg)  SpO2: 99%  Gen: Pleasant, well-nourished, in no distress,  normal affect  ENT: No lesions,  mouth clear,  oropharynx clear, no postnasal drip  Neck: No JVD, no TMG, no carotid bruits  Lungs: No use of accessory muscles, clear without rales or rhonchi  Cardiovascular: RRR, heart sounds normal, no  murmur or gallops, no peripheral edema  Musculoskeletal: No deformities, no cyanosis or clubbing  Neuro: alert, non focal, decreased fine motor R hand, good strength throughout  Skin: Warm, no lesions or rashes    04/24/13 -- TEE Study Conclusions - Left ventricle: Systolic function was normal. The estimated ejection fraction was in the range of 55% to 60%. Wall motion was normal; there were no regional wall motion abnormalities. - Aortic valve: No evidence of vegetation. - Mitral valve: Moderate thickening, consistent with rheumatic disease. The findings are consistent with mild stenosis. Mild regurgitation. - Left atrium: The atrium was moderately dilated. There was mild spontaneous echo contrast ("smoke") in the appendage. - Right atrium: There was mild spontaneous echo contrast ("smoke") in the cavity. - Atrial septum: No defect or patent foramen ovale was identified. Echo contrast study showed no right-to-left atrial level shunt, following an increase in RA pressure induced by provocative maneuvers. - Tricuspid valve: No evidence of vegetation. - Pulmonic valve: No evidence of vegetation. - Pericardium, extracardiac: A trivial pericardial effusion was identified.  Impressions: - Small oscillating density on anteriorMV leaflet of uncertain etiology (cannot R/O vegetation); suggest blood cultures and clinical correlation. 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 Negative saline microcavitation study.       Assessment & Plan:  Moderate to severe pulmonary hypertension PAH identified by echocardiogram in her hospitalization. Suspect that this relates to her long-standing mitral valve disease and now to the contribution of her endocarditis. At this point it would seem to be most important that she complete her antibiotic therapy. She has a followup echocardiogram planned and then discuss with [name] in cardiology. Depending on her  pulmonary pressures and the suspected contributions to her pulmonary hypertension we may decide to evaluate her further or other secondary causes. I will defer to [name] regarding the need or timing for this. The patient will call me as needed or for any questions

## 2013-04-25 ENCOUNTER — Ambulatory Visit (HOSPITAL_BASED_OUTPATIENT_CLINIC_OR_DEPARTMENT_OTHER)
Admission: RE | Admit: 2013-04-25 | Discharge: 2013-04-25 | Disposition: A | Discharge status: Home / Self Care | Payer: BC Managed Care – PPO | Source: Ambulatory Visit | Attending: Cardiology | Admitting: Cardiology

## 2013-04-25 DIAGNOSIS — I4891 Unspecified atrial fibrillation: Secondary | ICD-10-CM | POA: Insufficient documentation

## 2013-04-25 DIAGNOSIS — I050 Rheumatic mitral stenosis: Secondary | ICD-10-CM | POA: Insufficient documentation

## 2013-04-25 DIAGNOSIS — I079 Rheumatic tricuspid valve disease, unspecified: Secondary | ICD-10-CM | POA: Insufficient documentation

## 2013-04-25 DIAGNOSIS — I059 Rheumatic mitral valve disease, unspecified: Secondary | ICD-10-CM

## 2013-04-25 DIAGNOSIS — I379 Nonrheumatic pulmonary valve disorder, unspecified: Secondary | ICD-10-CM | POA: Insufficient documentation

## 2013-04-25 DIAGNOSIS — I2789 Other specified pulmonary heart diseases: Secondary | ICD-10-CM | POA: Insufficient documentation

## 2013-04-25 DIAGNOSIS — E785 Hyperlipidemia, unspecified: Secondary | ICD-10-CM | POA: Insufficient documentation

## 2013-04-25 DIAGNOSIS — R079 Chest pain, unspecified: Secondary | ICD-10-CM

## 2013-04-25 NOTE — Progress Notes (Signed)
  Echocardiogram 2D Echocardiogram has been performed.  Marie Malone 04/25/2013, 11:37 AM

## 2013-04-26 ENCOUNTER — Ambulatory Visit (INDEPENDENT_AMBULATORY_CARE_PROVIDER_SITE_OTHER): Payer: BC Managed Care – PPO | Admitting: Pharmacist

## 2013-04-26 DIAGNOSIS — I6350 Cerebral infarction due to unspecified occlusion or stenosis of unspecified cerebral artery: Secondary | ICD-10-CM

## 2013-04-26 DIAGNOSIS — I639 Cerebral infarction, unspecified: Secondary | ICD-10-CM

## 2013-04-26 DIAGNOSIS — I4891 Unspecified atrial fibrillation: Secondary | ICD-10-CM

## 2013-04-26 LAB — POCT INR: INR: 2.8

## 2013-04-27 ENCOUNTER — Encounter: Payer: Self-pay | Admitting: Internal Medicine

## 2013-05-02 ENCOUNTER — Encounter: Payer: Self-pay | Admitting: Internal Medicine

## 2013-05-04 ENCOUNTER — Ambulatory Visit (INDEPENDENT_AMBULATORY_CARE_PROVIDER_SITE_OTHER): Payer: BC Managed Care – PPO | Admitting: Pharmacist

## 2013-05-04 ENCOUNTER — Telehealth: Payer: Self-pay | Admitting: Pharmacist

## 2013-05-04 DIAGNOSIS — I4891 Unspecified atrial fibrillation: Secondary | ICD-10-CM

## 2013-05-04 DIAGNOSIS — I639 Cerebral infarction, unspecified: Secondary | ICD-10-CM

## 2013-05-04 DIAGNOSIS — I6350 Cerebral infarction due to unspecified occlusion or stenosis of unspecified cerebral artery: Secondary | ICD-10-CM

## 2013-05-04 NOTE — Telephone Encounter (Signed)
I called [facility] to get result as nothing was posted in Epic, and the nurse Norva Pavlov) told me INR was 6.13 (done at [facility]).  I notified patient's daughter of the result.  They deny any bleeding or bruising problems.  Patient's appetite is still not back to normal since her stroke, and the diarrhea has continued.  Patient and daughter were instructed to skip warfarin today and tomorrow, then reduce to 2.5 mg daily.  [facility] has already been given orders to recheck INR in 3 days and call result to coumadin clinic.  I instructed patient's daughter Marcie Bal) to take her mother to the ER if any significant bleeding starts, and she agrees to this.  I gave them my cell phone to call this weekend if any questions or problems as she is relatively new to coumadin.  To [name] who was DOD today as Juluis Rainier.  Encounter also created under anticoag template.

## 2013-05-04 NOTE — Telephone Encounter (Signed)
Patient had INR drawn today by [facility] and INR on point of care machine was > 8.0.  Nurse took a venipuncture sample to be run at lab STAT, however the vial wasn't labeled correctly, so nurse had to return to patient's home after 4 pm to redraw sample.  Patient has apparently had lots of diarrhea and hasn't been eating well for past few days.  This could contribute to an elevated INR. Nurse was instructed to take sample to [facility] so I could review result later today.  I will log on to [facility] and review the lab in a few hours and call result and instruction to patient and/or daughter.  I have already notified [facility] to recheck INR again in 3 days on Monday 05/07/13.

## 2013-05-07 ENCOUNTER — Encounter: Payer: Self-pay | Admitting: Internal Medicine

## 2013-05-07 ENCOUNTER — Ambulatory Visit (HOSPITAL_BASED_OUTPATIENT_CLINIC_OR_DEPARTMENT_OTHER): Payer: BC Managed Care – PPO | Attending: Family Medicine | Admitting: Radiology

## 2013-05-07 VITALS — Ht 62.0 in | Wt 184.0 lb

## 2013-05-07 DIAGNOSIS — G4710 Hypersomnia, unspecified: Secondary | ICD-10-CM | POA: Insufficient documentation

## 2013-05-07 DIAGNOSIS — G4733 Obstructive sleep apnea (adult) (pediatric): Secondary | ICD-10-CM

## 2013-05-08 ENCOUNTER — Telehealth: Payer: Self-pay | Admitting: Pharmacist

## 2013-05-08 NOTE — Telephone Encounter (Signed)
Patient was suppose to have a protime drawn yesterday by [facility], but the lab was never drawn.  I called [facility], and they told me blood work wasn't done, but that they will go tomorrow Marie Malone 12/31) to do it, and will call us with result.  I called daughter Marcie Bal to instruct her to continue warfarin 2.5 mg qd and we will call tomorrow once we get INR result.  Daughter shows verbal understanding of plan.

## 2013-05-09 ENCOUNTER — Telehealth: Payer: Self-pay | Admitting: Licensed Clinical Social Worker

## 2013-05-09 ENCOUNTER — Ambulatory Visit (INDEPENDENT_AMBULATORY_CARE_PROVIDER_SITE_OTHER): Payer: BC Managed Care – PPO | Admitting: Pharmacist

## 2013-05-09 ENCOUNTER — Telehealth: Payer: Self-pay | Admitting: Pharmacist

## 2013-05-09 DIAGNOSIS — I6350 Cerebral infarction due to unspecified occlusion or stenosis of unspecified cerebral artery: Secondary | ICD-10-CM

## 2013-05-09 DIAGNOSIS — I4891 Unspecified atrial fibrillation: Secondary | ICD-10-CM

## 2013-05-09 DIAGNOSIS — I639 Cerebral infarction, unspecified: Secondary | ICD-10-CM

## 2013-05-09 LAB — PROTIME-INR: INR: 5.4 — AB (ref 0.9–1.1)

## 2013-05-09 LAB — POCT INR: INR: 7.9

## 2013-05-09 NOTE — Telephone Encounter (Signed)
RN called stating that patient is having itching after infusion of antibiotics. RN would like an order to administer benadryl prior to infusion. Patient is also having some loose stools that have gotten better over the past week. RN collected a sample for C-Diff last week but did not get the results back and I did see the results here. RN suspects it is not C-Diff since symptoms have gotten better. Can patient take something like imodium as needed? Please advise.

## 2013-05-09 NOTE — Telephone Encounter (Signed)
[place] for benadryl and ok for immodium

## 2013-05-09 NOTE — Telephone Encounter (Signed)
Received call from [employer] from [facility], STAT INR 5.40. POCT INR to be checked via [facility] on 05/11/2013. Patient instructed to hold doses until then.   Oval Linsey. Leitha Schuller, PharmD Clinical Pharmacist - Resident Pager: [phone] Phone: [phone] 05/09/2013 1:18 PM

## 2013-05-11 ENCOUNTER — Ambulatory Visit (INDEPENDENT_AMBULATORY_CARE_PROVIDER_SITE_OTHER): Payer: BC Managed Care – PPO | Admitting: *Deleted

## 2013-05-11 ENCOUNTER — Ambulatory Visit (INDEPENDENT_AMBULATORY_CARE_PROVIDER_SITE_OTHER): Payer: BC Managed Care – PPO | Admitting: Pharmacist

## 2013-05-11 DIAGNOSIS — I639 Cerebral infarction, unspecified: Secondary | ICD-10-CM

## 2013-05-11 DIAGNOSIS — I6350 Cerebral infarction due to unspecified occlusion or stenosis of unspecified cerebral artery: Secondary | ICD-10-CM

## 2013-05-11 DIAGNOSIS — I4891 Unspecified atrial fibrillation: Secondary | ICD-10-CM

## 2013-05-11 LAB — POCT INR: INR: 2.6

## 2013-05-11 LAB — MDC_IDC_ENUM_SESS_TYPE_REMOTE

## 2013-05-11 MED ORDER — WARFARIN SODIUM 1 MG PO TABS: 1.0000 mg | ORAL_TABLET | Freq: Every day | ORAL | Status: DC

## 2013-05-12 DIAGNOSIS — G4733 Obstructive sleep apnea (adult) (pediatric): Secondary | ICD-10-CM

## 2013-05-12 NOTE — Sleep Study (Signed)
   NAME: Marie Malone DATE OF BIRTH:  04/08/1951 MEDICAL RECORD NUMBER [id number]  LOCATION: [facility]  PHYSICIAN: Charletha Dalpe D  DATE OF STUDY: 05/07/2013  SLEEP STUDY TYPE: Nocturnal Polysomnogram               REFERRING PHYSICIAN: Nita Sells, MD  INDICATION FOR STUDY: Hypersomnia with sleep apnea  EPWORTH SLEEPINESS SCORE:   22/24 HEIGHT: 5\' 2"  (157.5 cm)  WEIGHT: 184 lb (83.462 kg)    Body mass index is 33.65 kg/(m^2).  NECK SIZE: 14 in.  MEDICATIONS: Charted for review  SLEEP ARCHITECTURE: Total sleep time 446.5 minutes with sleep efficiency 96.7%. Stage I was 1.2%, stage II 80.2%, stage III 0.2%, REM at 18.4% of total sleep time. Sleep latency 1.5 minutes, REM latency 79.5 minutes, awake after sleep onset 13.5 minutes, arousal index 7.8. Bedtime medication: None  RESPIRATORY DATA: Apnea hypopnea index(AHI) 1.1 per hour. A total of 8 events were scored including 2 central apneas and 6 hypopneas. The events were not positional. REM AHI of 4.4 per hour.  OXYGEN DATA: Moderate snoring with oxygen desaturation to a nadir of 91% and mean oxygen saturation to the study of 96.8% on room air.  CARDIAC DATA: Sinus rhythm with occasional PVC  MOVEMENT/PARASOMNIA: A total of 95 limb jerks were counted of which 7 were associated with arousals or awakenings for periodic limb movement with arousal index of 0.9 per hour. No bathroom trips.  IMPRESSION/ RECOMMENDATION:   1) Normal sleep architecture was noted with long sleep time and little apparent disturbance.  2) Occasional respiratory sleep disturbance, within normal limits. AHI 1.1 per hour (the normal range for adults is an AHI between 0 and 5 events per hour). Moderate snoring with oxygen desaturation 20 dear of 91% and mean oxygen saturation through the study of 96.8% on room air.   Signed Baird Lyons M.D. Deneise Lever Diplomate, American Board of Sleep Medicine  ELECTRONICALLY SIGNED ON:   05/12/2013, 9:41 AM [place] PH: [phone]   FX: [phone] [place]

## 2013-05-15 ENCOUNTER — Ambulatory Visit (INDEPENDENT_AMBULATORY_CARE_PROVIDER_SITE_OTHER): Payer: BC Managed Care – PPO | Admitting: Pharmacist

## 2013-05-15 DIAGNOSIS — I639 Cerebral infarction, unspecified: Secondary | ICD-10-CM

## 2013-05-15 DIAGNOSIS — I6350 Cerebral infarction due to unspecified occlusion or stenosis of unspecified cerebral artery: Secondary | ICD-10-CM

## 2013-05-15 DIAGNOSIS — I4891 Unspecified atrial fibrillation: Secondary | ICD-10-CM

## 2013-05-15 LAB — POCT INR: INR: 3.3

## 2013-05-18 ENCOUNTER — Ambulatory Visit: Payer: BC Managed Care – PPO | Admitting: Cardiology

## 2013-05-20 ENCOUNTER — Telehealth: Payer: Self-pay | Admitting: Infectious Diseases

## 2013-05-20 NOTE — Telephone Encounter (Signed)
Cr now 1.8 from 1.62 (05-16-13). Asked [facility] to hold vancno til her primary Dr Tommy Medal can address in AM. They will fax labs to Dr Tommy Medal.

## 2013-05-21 ENCOUNTER — Encounter: Payer: Self-pay | Admitting: Infectious Disease

## 2013-05-21 ENCOUNTER — Ambulatory Visit (INDEPENDENT_AMBULATORY_CARE_PROVIDER_SITE_OTHER): Payer: BC Managed Care – PPO | Admitting: Infectious Disease

## 2013-05-21 ENCOUNTER — Ambulatory Visit (INDEPENDENT_AMBULATORY_CARE_PROVIDER_SITE_OTHER): Payer: BC Managed Care – PPO | Admitting: Pharmacist

## 2013-05-21 VITALS — BP 127/75 | HR 90 | Temp 98.1°F | Wt 174.0 lb

## 2013-05-21 DIAGNOSIS — I639 Cerebral infarction, unspecified: Secondary | ICD-10-CM

## 2013-05-21 DIAGNOSIS — A4902 Methicillin resistant Staphylococcus aureus infection, unspecified site: Secondary | ICD-10-CM

## 2013-05-21 DIAGNOSIS — R197 Diarrhea, unspecified: Secondary | ICD-10-CM

## 2013-05-21 DIAGNOSIS — L0291 Cutaneous abscess, unspecified: Secondary | ICD-10-CM

## 2013-05-21 DIAGNOSIS — K521 Toxic gastroenteritis and colitis: Secondary | ICD-10-CM

## 2013-05-21 DIAGNOSIS — I38 Endocarditis, valve unspecified: Secondary | ICD-10-CM

## 2013-05-21 DIAGNOSIS — L0390 Cellulitis, unspecified: Secondary | ICD-10-CM

## 2013-05-21 DIAGNOSIS — I6350 Cerebral infarction due to unspecified occlusion or stenosis of unspecified cerebral artery: Secondary | ICD-10-CM

## 2013-05-21 DIAGNOSIS — T3695XA Adverse effect of unspecified systemic antibiotic, initial encounter: Secondary | ICD-10-CM

## 2013-05-21 DIAGNOSIS — I4891 Unspecified atrial fibrillation: Secondary | ICD-10-CM

## 2013-05-21 LAB — BASIC METABOLIC PANEL WITH GFR
BUN: 10 mg/dL (ref 6–23)
CO2: 29 mEq/L (ref 19–32)
Calcium: 8.5 mg/dL (ref 8.4–10.5)
Chloride: 99 mEq/L (ref 96–112)
Creat: 1.6 mg/dL — ABNORMAL HIGH (ref 0.50–1.10)
GFR, EST AFRICAN AMERICAN: 40 mL/min — AB
GFR, Est Non African American: 35 mL/min — ABNORMAL LOW
Glucose, Bld: 76 mg/dL (ref 70–99)
POTASSIUM: 2.9 meq/L — AB (ref 3.5–5.3)
SODIUM: 140 meq/L (ref 135–145)

## 2013-05-21 LAB — POCT INR: INR: 1.4

## 2013-05-21 NOTE — Progress Notes (Signed)
RN received verbal order to discontinue the patient's PICC line.  Patient identified with name and date of birth. PICC dressing removed, site unremarkable.    PICC line removed using sterile procedure @ 0930. PICC length equal to that noted in patient's hospital chart of 43 cm. Sterile petroleum gauze + sterile 4X4 applied to PICC site, pressure applied for 10 minutes and covered with Medipore tape as a pressure dressing. Patient tolerated procedure without complaints.  Patient instructed to limit use of arm for 1 hour. Patient instructed that the pressure dressing should remain in place for 24 hours. Patient verbalized understanding of these instructions.

## 2013-05-21 NOTE — Progress Notes (Signed)
Subjective:    Patient ID: Marie Malone, female    DOB: [birth year], 63 y.o.   MRN: [id number]  HPI  63 year old [facility]. She was found to have a Moderate-sized acute posterior left MCA infarct. Edema without  significant mass effect. No associated hemorrhage MRA showed an Occluded left MCA posterior M2 branch about 10 mm beyond its origin.  During the hospitalization she also had workup with TEE which showed :  mild mitral stenosis and mild mitral regurgitation. The left atrium was dilated and there was spontaneous contrast in the left atrial appendage but no thrombus. There was mild spontaneous contrast in the right atrium. There was a small oscillating density on the anterior mitral valve leaflet of uncertain etiology and vegetation could not be excluded. There was a small oscillating density on the aortic valve felt most likely to be Lambl's excrescence  Patient had a set of blood cultures drawn and was dc to home. [name] called [facility] to his concerns re the area that appeared c/w a vegetation in the mitral valve.   He noted that pt had had a recent course of doxycycline begun that was finished yesterday and which likely will obfuscate the blood cultures.  Pt has had a loop recorder placed yesterday to look for arrythmias including atrial fibrillation.     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  I arranged to see the pt in my office in consultation  And then arrange for her to be admitted to the hospital where we perform blood cultures and check serologies for Coxiella Bartonella and Chlamydia.  We started the patient on vancomycin dose for trough of 15-20 and on ceftriaxone which we dosed at 2 g every 12 hours to achieve maximal central nervous system penetration.   When I saw her in clinic in followup she was suffering from diarrhea and had down titrated her ceftriaxone dose to 2 g  daily. This resulted in improvement in her diarrhea. We also checked her for C. difficile by PCR and this was negative.  Since then she has done well and she is nearly completed 6 weeks of both ceftriaxone and vancomycin. She recently had elevated serum creatinine to 1.81 over the weekend her vancomycin trough of the time was 11.5. My partner Dr.*pace and he had the patient continue to hold the vancomycin until the patient is seen today in followup. She doing quite well without fevers or malaise. 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    Review of Systems  Constitutional: Negative for diaphoresis, activity change, appetite change, fatigue and unexpected weight change.  HENT: Negative for congestion, dental problem, rhinorrhea, sinus pressure, sneezing, sore throat and trouble swallowing.   Eyes: Negative for photophobia and visual disturbance.  Respiratory: Negative for chest tightness, shortness of breath, wheezing and stridor.   Cardiovascular: Negative for chest pain, palpitations and leg swelling.  Gastrointestinal: Negative for nausea, diarrhea, constipation, blood in stool, abdominal distention and anal bleeding.  Genitourinary: Negative for dysuria, hematuria, flank pain and difficulty urinating.  Musculoskeletal: Negative for back pain, gait problem and joint swelling.  Skin: Positive for wound. Negative for color change, pallor and rash.  Neurological: Negative for dizziness, tremors, speech difficulty and light-headedness.  Hematological: Negative for adenopathy. Does not bruise/bleed easily.  Psychiatric/Behavioral: Negative for behavioral problems, confusion, sleep disturbance, dysphoric mood, decreased  concentration and agitation.       Objective:   Physical Exam  Constitutional: She is oriented to person, place, and time. She appears well-developed and well-nourished. No distress.  HENT:  Head: Normocephalic and atraumatic.    Mouth/Throat: Oropharynx is clear and moist. No oropharyngeal exudate.    Eyes: Conjunctivae and EOM are normal. No scleral icterus.  Neck: Normal range of motion. Neck supple. No JVD present.  Cardiovascular: Normal rate, regular rhythm and normal heart sounds.  Exam reveals no gallop and no friction rub.   No murmur heard. Pulmonary/Chest: Effort normal and breath sounds normal. No respiratory distress. She has no wheezes. She has no rales. She exhibits no tenderness.  Abdominal: She exhibits no distension. There is tenderness. There is no rebound and no guarding.  Musculoskeletal: She exhibits no edema and no tenderness.  Lymphadenopathy:    She has no cervical adenopathy.  Neurological: She is alert and oriented to person, place, and time. No cranial nerve deficit. She exhibits normal muscle tone. Coordination normal.  Her strength on right UE is less vigorous than left but still 5 out of 5  Skin: Skin is warm and dry. She is not diaphoretic. No erythema. No pallor.  She has a few areas of linear hyperpigmentation on 2 of her toenails that are of uncertain acuity. See picture  Her hands had no splinter hemorrhages Janeway lesions or Osler nodes.  Psychiatric: She has a normal mood and affect. Her behavior is normal. Judgment and thought content normal.   PICC is clean and without erythema                   Assessment & Plan:   #1 Rule out Mitral valve endocarditis vs nonbacterial thrombotic endocarditis. I  --Discontinue her ceftriaxone and vancomycin today  She did have one elevated anticardiolipin antibody but thinks is of no clinical significance.  Have her come back to clinic in 2 weeks' time to check surveillance blood cultures and recheck serologies for Bartonella and Coxiella were not targeting these agents with current drugs).  Pull PICC line I spent greater than 20 minutes with the patient including greater than 50% of time in face to face counsel of  the patient and in coordination of their care.  # 2 Diarrhea: Sounds to be antibiotic related. It has resolved  #3  MRSA abscess: presumptive and highly likely and she had prior lesion on her chin, this is resolved

## 2013-05-22 ENCOUNTER — Encounter: Payer: Self-pay | Admitting: Infectious Disease

## 2013-05-24 ENCOUNTER — Encounter: Payer: Self-pay | Admitting: Family

## 2013-05-24 DIAGNOSIS — E785 Hyperlipidemia, unspecified: Secondary | ICD-10-CM

## 2013-05-24 MED ORDER — ATORVASTATIN CALCIUM 80 MG PO TABS: 80.0000 mg | ORAL_TABLET | Freq: Every day | ORAL | Status: DC

## 2013-05-24 NOTE — Telephone Encounter (Signed)
See orders/mychart message.

## 2013-05-25 NOTE — Telephone Encounter (Signed)
Lab orders entered

## 2013-05-28 ENCOUNTER — Encounter: Payer: Self-pay | Admitting: *Deleted

## 2013-05-28 ENCOUNTER — Ambulatory Visit (INDEPENDENT_AMBULATORY_CARE_PROVIDER_SITE_OTHER): Payer: BC Managed Care – PPO | Admitting: Pharmacist

## 2013-05-28 ENCOUNTER — Encounter: Payer: Self-pay | Admitting: Internal Medicine

## 2013-05-28 ENCOUNTER — Encounter: Payer: Self-pay | Admitting: Cardiology

## 2013-05-28 ENCOUNTER — Ambulatory Visit (INDEPENDENT_AMBULATORY_CARE_PROVIDER_SITE_OTHER): Payer: BC Managed Care – PPO | Admitting: Cardiology

## 2013-05-28 ENCOUNTER — Encounter: Payer: Self-pay | Admitting: Infectious Disease

## 2013-05-28 VITALS — BP 138/72 | HR 85 | Ht 62.0 in | Wt 171.0 lb

## 2013-05-28 DIAGNOSIS — I10 Essential (primary) hypertension: Secondary | ICD-10-CM

## 2013-05-28 DIAGNOSIS — I639 Cerebral infarction, unspecified: Secondary | ICD-10-CM

## 2013-05-28 DIAGNOSIS — I6350 Cerebral infarction due to unspecified occlusion or stenosis of unspecified cerebral artery: Secondary | ICD-10-CM

## 2013-05-28 DIAGNOSIS — I679 Cerebrovascular disease, unspecified: Secondary | ICD-10-CM

## 2013-05-28 DIAGNOSIS — E785 Hyperlipidemia, unspecified: Secondary | ICD-10-CM

## 2013-05-28 DIAGNOSIS — I050 Rheumatic mitral stenosis: Secondary | ICD-10-CM

## 2013-05-28 DIAGNOSIS — I38 Endocarditis, valve unspecified: Secondary | ICD-10-CM

## 2013-05-28 DIAGNOSIS — I4891 Unspecified atrial fibrillation: Secondary | ICD-10-CM

## 2013-05-28 DIAGNOSIS — I059 Rheumatic mitral valve disease, unspecified: Secondary | ICD-10-CM

## 2013-05-28 LAB — POCT INR: INR: 1.3

## 2013-05-28 NOTE — Progress Notes (Signed)
HPI: FU MS and recent CVA. Patient has a history of rheumatic fever. Cardiac catheterization in [facility] in June of 2008 showed normal LV function, no coronary disease, moderate mitral stenosis with a valve area of 1.1 cm, and severe pulmonary hypertension. TEE in [facility] in 2008 showed normal LV function and moderate mitral stenosis with a valve area of 1.4 cm, mild MR. Patient had valvuloplasty at [facility] in 2008. Nuclear study in April of 2014 showed an ejection fraction of 56%. There was a fixed anterior defect consistent with soft tissue attenuation but no ischemia. Abdominal ultrasound in April of 2014 normal. Patient was admitted on November 28 with a CVA. 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 Transesophageal echocardiogram on December 2 showed normal LV function. There was mild mitral stenosis and mild mitral regurgitation. The left atrium was dilated and there was spontaneous contrast in the left atrial appendage but no thrombus. There was mild spontaneous contrast in the right atrium. There was a small oscillating density on the anterior mitral valve leaflet of uncertain etiology and vegetation could not be excluded. There was a small oscillating density on the aortic valve felt most likely to be Lambl's excrescence. Sedimentation rate was checked yesterday and was 69. Patient did have an implantable loop placed prior to discharge. followup interrogation showed atrial fibrillation. She was also seen by infectious disease and it was felt she could have endocarditis based on TEE results and she was treated. Since I last saw her, she denies dyspnea, chest pain, palpitations, syncope, bleeding or fever. She has completed her course of antibiotics.   Current Outpatient Prescriptions  Medication Sig Dispense Refill  . amLODipine (NORVASC) 10 MG tablet Take 1 tablet (10 mg total) by mouth daily.  30 tablet  5  . atorvastatin (LIPITOR) 80 MG  tablet Take 1 tablet (80 mg total) by mouth daily at 6 PM.  30 tablet  2  . metoprolol succinate (TOPROL-XL) 25 MG 24 hr tablet Take 1 tablet (25 mg total) by mouth daily.  30 tablet  5  . warfarin (COUMADIN) 1 MG tablet Take 1 tablet (1 mg total) by mouth daily.  30 tablet  3   No current facility-administered medications for this visit.     Past Medical History  Diagnosis Date  . Hypertension   . Stroke   . History of rheumatic fever     as a child  . Hyperlipidemia   . Mitral stenosis   . OSA (obstructive sleep apnea)     mild per sleep study 2008  . Heart murmur   . Complication of anesthesia     difficult to wake up from anesthesia  . Pneumonia     double pneumonia once  . Atrial fibrillation     Past Surgical History  Procedure Laterality Date  . Cardiac surgery  2008    "balloon surgery at [facility]"  . Abdominal hysterectomy    . Colonoscopy w/ polypectomy    . Appendectomy  1989    appendix ruptured,had peritonitis  . Tee without cardioversion N/A 04/10/2013    Procedure: TRANSESOPHAGEAL ECHOCARDIOGRAM (TEE);  Surgeon: [name], MD;  Location: [facility] ENDOSCOPY;  Service: Cardiovascular;  Laterality: N/A;  . Loop recorder implant  04-10-2013    MDT LinQ implanted by Dr Rayann Heman for cryptogenic stroke    History   Social History  . Marital Status: Married    Spouse Name: N/A  Number of Children: 2  . Years of Education: N/A   Occupational History  . Not on file.   Social History Main Topics  . Smoking status: Former Smoker -- 1.00 packs/day for 2 years    Types: Cigarettes    Quit date: 05/11/1975  . Smokeless tobacco: Not on file  . Alcohol Use: No     Comment: drank years ago when young  . Drug Use: No  . Sexual Activity: Not on file   Other Topics Concern  . Not on file   Social History Narrative   Retired Archivist   Married   She has 2 grown children-   Daughter lives with her   Son lives in [place]   6 grandchildren     ROS: no fevers or chills, productive cough, hemoptysis, dysphasia, odynophagia, melena, hematochezia, dysuria, hematuria, rash, seizure activity, orthopnea, PND, pedal edema, claudication. Remaining systems are negative.  Physical Exam: Well-developed well-nourished in no acute distress.  Skin is warm and dry.  HEENT is normal.  Neck is supple.  Chest is clear to auscultation with normal expansion.  Cardiovascular exam is irregular Abdominal exam nontender or distended. No masses palpated. Extremities show no edema. neuro grossly intact  ECG atrial fibrillation at a rate of 85. Right axis deviation. Nonspecific ST changes.

## 2013-05-28 NOTE — Assessment & Plan Note (Signed)
Continue statin. Not on aspirin given need for Coumadin. Followup carotid Dopplers November 2015.

## 2013-05-28 NOTE — Assessment & Plan Note (Addendum)
Patient was found to have atrial fibrillation on her implantable loop. She is also in atrial fibrillation today. She has a recent CVA. She will therefore require lifelong Coumadin. Continue Toprol for rate control. She is not symptomatic from her atrial fibrillation. We will arrange to have her loop recorder explanted.

## 2013-05-28 NOTE — Assessment & Plan Note (Signed)
Continue statin. 

## 2013-05-28 NOTE — Assessment & Plan Note (Signed)
Blood pressure controlled.continue present medications. 

## 2013-05-28 NOTE — Assessment & Plan Note (Signed)
Patient noted previously to have a small oscillating density on the mitral valve. This was felt to potentially have caused her prior CVA prior to her atrial fibrillation been diagnosed. It was also felt to potentially be a vegetation. She was treated with antibiotics empirically. She is scheduled to have repeat blood cultures in 2 weeks now that she is off of antibiotics. Plan repeat echocardiogram.

## 2013-05-28 NOTE — Patient Instructions (Signed)
Your physician wants you to follow-up in: 3 MONTHS WITH DR CRENSHAW You will receive a reminder letter in the mail two months in advance. If you don't receive a letter, please call our office to schedule the follow-up appointment.   Your physician has requested that you have an echocardiogram. Echocardiography is a painless test that uses sound waves to create images of your heart. It provides your doctor with information about the size and shape of your heart and how well your heart's chambers and valves are working. This procedure takes approximately one hour. There are no restrictions for this procedure.   

## 2013-05-28 NOTE — Assessment & Plan Note (Signed)
Continue SBE prophylaxis. 

## 2013-06-01 ENCOUNTER — Ambulatory Visit (INDEPENDENT_AMBULATORY_CARE_PROVIDER_SITE_OTHER): Payer: BC Managed Care – PPO | Admitting: Cardiology

## 2013-06-01 DIAGNOSIS — I4891 Unspecified atrial fibrillation: Secondary | ICD-10-CM

## 2013-06-01 DIAGNOSIS — I6350 Cerebral infarction due to unspecified occlusion or stenosis of unspecified cerebral artery: Secondary | ICD-10-CM

## 2013-06-01 DIAGNOSIS — I639 Cerebral infarction, unspecified: Secondary | ICD-10-CM

## 2013-06-01 LAB — HEPATIC FUNCTION PANEL
ALT: 21 U/L (ref 0–35)
AST: 33 U/L (ref 0–37)
Albumin: 3.7 g/dL (ref 3.5–5.2)
Alkaline Phosphatase: 111 U/L (ref 39–117)
BILIRUBIN INDIRECT: 0.6 mg/dL (ref 0.0–0.9)
Bilirubin, Direct: 0.2 mg/dL (ref 0.0–0.3)
Total Bilirubin: 0.8 mg/dL (ref 0.3–1.2)
Total Protein: 7.9 g/dL (ref 6.0–8.3)

## 2013-06-01 LAB — LIPID PANEL
Cholesterol: 126 mg/dL (ref 0–200)
HDL: 45 mg/dL (ref >39)
LDL CALC: 62 mg/dL (ref 0–99)
Total CHOL/HDL Ratio: 2.8 Ratio
Triglycerides: 96 mg/dL (ref <150)
VLDL: 19 mg/dL (ref 0–40)

## 2013-06-01 LAB — POCT INR: INR: 1.8

## 2013-06-04 ENCOUNTER — Ambulatory Visit (INDEPENDENT_AMBULATORY_CARE_PROVIDER_SITE_OTHER): Payer: BC Managed Care – PPO | Admitting: Infectious Disease

## 2013-06-04 ENCOUNTER — Encounter: Payer: Self-pay | Admitting: Infectious Disease

## 2013-06-04 ENCOUNTER — Encounter: Payer: Self-pay | Admitting: Family

## 2013-06-04 ENCOUNTER — Ambulatory Visit (INDEPENDENT_AMBULATORY_CARE_PROVIDER_SITE_OTHER): Payer: BC Managed Care – PPO | Admitting: *Deleted

## 2013-06-04 VITALS — BP 136/78 | HR 96 | Temp 97.6°F | Wt 171.5 lb

## 2013-06-04 DIAGNOSIS — I639 Cerebral infarction, unspecified: Secondary | ICD-10-CM

## 2013-06-04 DIAGNOSIS — L270 Generalized skin eruption due to drugs and medicaments taken internally: Secondary | ICD-10-CM

## 2013-06-04 DIAGNOSIS — I4891 Unspecified atrial fibrillation: Secondary | ICD-10-CM

## 2013-06-04 DIAGNOSIS — Z5181 Encounter for therapeutic drug level monitoring: Secondary | ICD-10-CM

## 2013-06-04 DIAGNOSIS — I6350 Cerebral infarction due to unspecified occlusion or stenosis of unspecified cerebral artery: Secondary | ICD-10-CM

## 2013-06-04 DIAGNOSIS — I38 Endocarditis, valve unspecified: Secondary | ICD-10-CM

## 2013-06-04 DIAGNOSIS — I6340 Cerebral infarction due to embolism of unspecified cerebral artery: Secondary | ICD-10-CM

## 2013-06-04 LAB — POCT INR: INR: 4.3

## 2013-06-04 NOTE — Progress Notes (Signed)
Subjective:    Patient ID: Marie Malone, female    DOB: 07-19-1950, 63 y.o.   MRN: [id number]  HPI   63 year old recently admitted [facility] with [facility]. She was found to have a Moderate-sized acute posterior left MCA infarct. Edema without  significant mass effect. No associated hemorrhage MRA showed an Occluded left MCA posterior M2 branch about 10 mm beyond its origin.  During the hospitalization she also had workup with TEE which showed :  mild mitral stenosis and mild mitral regurgitation. The left atrium was dilated and there was spontaneous contrast in the left atrial appendage but no thrombus. There was mild spontaneous contrast in the right atrium. There was a small oscillating density on the anterior mitral valve leaflet of uncertain etiology and vegetation could not be excluded. There was a small oscillating density on the aortic valve felt most likely to be Lambl's excrescence  Patient had a set of blood cultures drawn and was dc to home. [name] called [facility] to his concerns re the area that appeared c/w a vegetation in the mitral valve.   He noted that pt had had a recent course of doxycycline begun that was finished yesterday and which likely will obfuscate the blood cultures.  Pt has had a loop recorder placed yesterday to look for arrythmias including atrial fibrillation.     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  I arranged to see the pt in my office in consultation  And then arrange for her to be admitted to the hospital where we perform blood cultures and check serologies for Coxiella Bartonella and Chlamydia.  We started the patient on vancomycin dose for trough of 15-20 and on ceftriaxone which we dosed at 2 g every 12 hours to achieve maximal central nervous system penetration.   When I saw her in clinic in followup she was suffering from diarrhea and had down titrated her ceftriaxone dose to 2  g daily. This resulted in improvement in her diarrhea. We also checked her for C. difficile by PCR and this was negative.  Since then she has done well and she iscompleted 6 weeks of both ceftriaxone and vancomycin.   She has been found on implantable loop recorder to be suffering from ATrial fibrillation by [name] and she is no on coumadin therapy  SHe is in good spirits and playing a video game on her smart phone. No fevers, nausea, malaise, focal weakness.  She did have very dry skin on hands, lips with exfoliation on hands still recovering from this.    Review of Systems  Constitutional: Negative for diaphoresis, activity change, appetite change, fatigue and unexpected weight change.  HENT: Negative for congestion, dental problem, rhinorrhea, sinus pressure, sneezing, sore throat and trouble swallowing.   Eyes: Negative for photophobia and visual disturbance.  Respiratory: Negative for chest tightness, shortness of breath, wheezing and stridor.   Cardiovascular: Negative for chest pain, palpitations and leg swelling.  Gastrointestinal: Negative for nausea, diarrhea, constipation, blood in stool, abdominal distention and anal bleeding.  Genitourinary: Negative for dysuria, hematuria, flank pain and difficulty urinating.  Musculoskeletal: Negative for back pain, gait problem and joint swelling.  Skin: Negative for color change, pallor and rash.  Neurological: Negative for dizziness, tremors, speech difficulty and light-headedness.  Hematological: Negative for adenopathy. Does not bruise/bleed easily.  Psychiatric/Behavioral: Negative for behavioral problems, confusion, sleep disturbance, dysphoric mood, decreased  concentration and agitation.       Objective:   Physical Exam  Constitutional: She is oriented to person, place, and time. She appears well-developed and well-nourished. No distress.  HENT:  Head: Normocephalic and atraumatic.  Mouth/Throat: Oropharynx is clear and  moist. No oropharyngeal exudate.    Eyes: Conjunctivae and EOM are normal. No scleral icterus.  Neck: Normal range of motion. Neck supple. No JVD present.  Cardiovascular: Normal rate, regular rhythm and normal heart sounds.  Exam reveals no gallop and no friction rub.   No murmur heard. Pulmonary/Chest: Effort normal and breath sounds normal. No respiratory distress. She has no wheezes. She has no rales. She exhibits no tenderness.  Abdominal: She exhibits no distension. There is no rebound and no guarding.  Musculoskeletal: She exhibits no edema and no tenderness.  Lymphadenopathy:    She has no cervical adenopathy.  Neurological: She is alert and oriented to person, place, and time. No cranial nerve deficit. She exhibits normal muscle tone. Coordination normal.  Her strength on right UE is less vigorous than left but still 5 out of 5  Skin: Skin is warm. She is not diaphoretic. No erythema. No pallor.     She has a few areas of linear hyperpigmentation on 2 of her toenails that are of uncertain acuity.   inter hemorrhages Janeway lesions or Osler nodes. Her hands had no spl  Psychiatric: She has a normal mood and affect. Her behavior is normal. Judgment and thought content normal.   Hands:                        Assessment & Plan:   #1 Rule out Mitral valve endocarditis vs nonbacterial thrombotic endocarditis. I  --recheck blood cultures x 2 sites --recheck serologies for Bartonella and Coxiella   She did have one elevated anticardiolipin antibody but thinks is of no clinical significance--and now she will be on coumadin anyway   #2  MRSA abscess: presumptive and highly likely and she had prior lesion on her chin, this is resolved  #3 Dry skin with exfoliation: could have been severe "[place]" would be cautious with vancomycin (and rocephin /cephalosporins as well) since these were 2 drugs she was on when she developed this cutaneous reaction    #4  Atrial fibrillation: on coumadin and followed closely by [name].

## 2013-06-06 LAB — BARTONELLA ANTIBODY PANEL: B henselae IgG: NEGATIVE

## 2013-06-06 LAB — BARTONELLA ANITBODY PANEL: B HENSELAE IGM: NEGATIVE

## 2013-06-11 ENCOUNTER — Ambulatory Visit (INDEPENDENT_AMBULATORY_CARE_PROVIDER_SITE_OTHER): Payer: BC Managed Care – PPO

## 2013-06-11 ENCOUNTER — Ambulatory Visit (INDEPENDENT_AMBULATORY_CARE_PROVIDER_SITE_OTHER): Payer: BC Managed Care – PPO | Admitting: *Deleted

## 2013-06-11 DIAGNOSIS — I6350 Cerebral infarction due to unspecified occlusion or stenosis of unspecified cerebral artery: Secondary | ICD-10-CM

## 2013-06-11 DIAGNOSIS — Z5181 Encounter for therapeutic drug level monitoring: Secondary | ICD-10-CM

## 2013-06-11 DIAGNOSIS — I639 Cerebral infarction, unspecified: Secondary | ICD-10-CM

## 2013-06-11 DIAGNOSIS — I4891 Unspecified atrial fibrillation: Secondary | ICD-10-CM

## 2013-06-11 LAB — MDC_IDC_ENUM_SESS_TYPE_REMOTE: Date Time Interrogation Session: 20150220050500

## 2013-06-11 LAB — POCT INR: INR: 1.6

## 2013-06-11 LAB — CULTURE, BLOOD (SINGLE)
Organism ID, Bacteria: NO GROWTH
Organism ID, Bacteria: NO GROWTH

## 2013-06-15 ENCOUNTER — Ambulatory Visit (HOSPITAL_COMMUNITY): Payer: BC Managed Care – PPO | Attending: Internal Medicine | Admitting: Radiology

## 2013-06-15 DIAGNOSIS — E669 Obesity, unspecified: Secondary | ICD-10-CM | POA: Insufficient documentation

## 2013-06-15 DIAGNOSIS — I639 Cerebral infarction, unspecified: Secondary | ICD-10-CM

## 2013-06-15 DIAGNOSIS — I339 Acute and subacute endocarditis, unspecified: Secondary | ICD-10-CM

## 2013-06-15 DIAGNOSIS — I10 Essential (primary) hypertension: Secondary | ICD-10-CM | POA: Insufficient documentation

## 2013-06-15 DIAGNOSIS — I052 Rheumatic mitral stenosis with insufficiency: Secondary | ICD-10-CM

## 2013-06-15 DIAGNOSIS — I4891 Unspecified atrial fibrillation: Secondary | ICD-10-CM | POA: Insufficient documentation

## 2013-06-15 DIAGNOSIS — Z6831 Body mass index (BMI) 31.0-31.9, adult: Secondary | ICD-10-CM | POA: Insufficient documentation

## 2013-06-15 DIAGNOSIS — I38 Endocarditis, valve unspecified: Secondary | ICD-10-CM

## 2013-06-15 DIAGNOSIS — I050 Rheumatic mitral stenosis: Secondary | ICD-10-CM

## 2013-06-15 DIAGNOSIS — Z8673 Personal history of transient ischemic attack (TIA), and cerebral infarction without residual deficits: Secondary | ICD-10-CM | POA: Insufficient documentation

## 2013-06-15 DIAGNOSIS — E785 Hyperlipidemia, unspecified: Secondary | ICD-10-CM | POA: Insufficient documentation

## 2013-06-15 DIAGNOSIS — I059 Rheumatic mitral valve disease, unspecified: Secondary | ICD-10-CM

## 2013-06-15 DIAGNOSIS — I679 Cerebrovascular disease, unspecified: Secondary | ICD-10-CM

## 2013-06-15 NOTE — Progress Notes (Signed)
Echocardiogram performed.  

## 2013-06-20 ENCOUNTER — Telehealth: Payer: Self-pay | Admitting: *Deleted

## 2013-06-20 NOTE — Telephone Encounter (Signed)
Spoke with patient's daughter and informed that we will place on cancellation list, no sooner appt and for her to call back if any questions and concerns. She said that patient is not having any problems at present time,verbalized understanding.

## 2013-06-21 ENCOUNTER — Ambulatory Visit (INDEPENDENT_AMBULATORY_CARE_PROVIDER_SITE_OTHER): Payer: BC Managed Care – PPO | Admitting: *Deleted

## 2013-06-21 DIAGNOSIS — I6350 Cerebral infarction due to unspecified occlusion or stenosis of unspecified cerebral artery: Secondary | ICD-10-CM

## 2013-06-21 DIAGNOSIS — I639 Cerebral infarction, unspecified: Secondary | ICD-10-CM

## 2013-06-21 DIAGNOSIS — Z5181 Encounter for therapeutic drug level monitoring: Secondary | ICD-10-CM

## 2013-06-21 DIAGNOSIS — I4891 Unspecified atrial fibrillation: Secondary | ICD-10-CM

## 2013-06-21 LAB — POCT INR: INR: 1.6

## 2013-06-26 ENCOUNTER — Ambulatory Visit: Payer: Self-pay | Admitting: Neurology

## 2013-06-29 ENCOUNTER — Encounter: Payer: Self-pay | Admitting: Internal Medicine

## 2013-07-03 ENCOUNTER — Encounter: Payer: Self-pay | Admitting: Infectious Disease

## 2013-07-09 ENCOUNTER — Ambulatory Visit (INDEPENDENT_AMBULATORY_CARE_PROVIDER_SITE_OTHER): Payer: BC Managed Care – PPO

## 2013-07-09 DIAGNOSIS — I639 Cerebral infarction, unspecified: Secondary | ICD-10-CM

## 2013-07-09 DIAGNOSIS — I4891 Unspecified atrial fibrillation: Secondary | ICD-10-CM

## 2013-07-09 DIAGNOSIS — I6350 Cerebral infarction due to unspecified occlusion or stenosis of unspecified cerebral artery: Secondary | ICD-10-CM

## 2013-07-09 DIAGNOSIS — Z5181 Encounter for therapeutic drug level monitoring: Secondary | ICD-10-CM

## 2013-07-09 LAB — POCT INR: INR: 2.4

## 2013-07-09 MED ORDER — WARFARIN SODIUM 1 MG PO TABS: ORAL_TABLET | ORAL | Status: DC

## 2013-07-11 ENCOUNTER — Encounter: Payer: Self-pay | Admitting: Family

## 2013-07-11 ENCOUNTER — Ambulatory Visit (INDEPENDENT_AMBULATORY_CARE_PROVIDER_SITE_OTHER): Payer: BC Managed Care – PPO | Admitting: *Deleted

## 2013-07-11 DIAGNOSIS — I6350 Cerebral infarction due to unspecified occlusion or stenosis of unspecified cerebral artery: Secondary | ICD-10-CM

## 2013-07-11 DIAGNOSIS — I639 Cerebral infarction, unspecified: Secondary | ICD-10-CM

## 2013-07-12 NOTE — Telephone Encounter (Signed)
Please let pt know that I reviewed her mychart message. She needs to be re-evaluated in office please.

## 2013-07-12 NOTE — Telephone Encounter (Signed)
Spoke with Marie Malone. Reports she ate dinner keeping it down.  Reports 4-5 soft stools a day. Poor appetite.  Offered her my only apt tomorrow at [facility].  She has another appointment and cannot come at that time.  Advised her that I would have someone call her tomorrow to schedule an appointment for Monday.  Also, advised Marie Malone to to to ER if unable to keep down food/liquid or if diarrhea becomes severe. She verbalizes understanding. Colletta Maryland, can you pls call Marie Malone and arrange apt for Monday?

## 2013-07-16 ENCOUNTER — Encounter: Payer: Self-pay | Admitting: Family

## 2013-07-16 ENCOUNTER — Ambulatory Visit (INDEPENDENT_AMBULATORY_CARE_PROVIDER_SITE_OTHER): Payer: BC Managed Care – PPO | Admitting: Family

## 2013-07-16 VITALS — BP 122/80 | HR 77 | Temp 98.4°F | Resp 16 | Ht 63.0 in | Wt 155.1 lb

## 2013-07-16 DIAGNOSIS — R197 Diarrhea, unspecified: Secondary | ICD-10-CM

## 2013-07-16 DIAGNOSIS — R10814 Left lower quadrant abdominal tenderness: Secondary | ICD-10-CM

## 2013-07-16 LAB — CBC WITH DIFFERENTIAL/PLATELET
BASOS PCT: 1 % (ref 0–1)
Basophils Absolute: 0 10*3/uL (ref 0.0–0.1)
Eosinophils Absolute: 0.1 10*3/uL (ref 0.0–0.7)
Eosinophils Relative: 2 % (ref 0–5)
HCT: 33.5 % — ABNORMAL LOW (ref 36.0–46.0)
Hemoglobin: 11.3 g/dL — ABNORMAL LOW (ref 12.0–15.0)
LYMPHS ABS: 1.5 10*3/uL (ref 0.7–4.0)
Lymphocytes Relative: 34 % (ref 12–46)
MCH: 27.7 pg (ref 26.0–34.0)
MCHC: 33.7 g/dL (ref 30.0–36.0)
MCV: 82.1 fL (ref 78.0–100.0)
Monocytes Absolute: 0.7 10*3/uL (ref 0.1–1.0)
Monocytes Relative: 16 % — ABNORMAL HIGH (ref 3–12)
NEUTROS ABS: 2.1 10*3/uL (ref 1.7–7.7)
NEUTROS PCT: 47 % (ref 43–77)
PLATELETS: 174 10*3/uL (ref 150–400)
RBC: 4.08 MIL/uL (ref 3.87–5.11)
RDW: 15.7 % — ABNORMAL HIGH (ref 11.5–15.5)
WBC: 4.5 10*3/uL (ref 4.0–10.5)

## 2013-07-16 LAB — BASIC METABOLIC PANEL WITH GFR
BUN: 11 mg/dL (ref 6–23)
CALCIUM: 7.6 mg/dL — AB (ref 8.4–10.5)
CO2: 33 mEq/L — ABNORMAL HIGH (ref 19–32)
Chloride: 101 mEq/L (ref 96–112)
Creat: 1.04 mg/dL (ref 0.50–1.10)
GFR, EST AFRICAN AMERICAN: 67 mL/min
GFR, Est Non African American: 58 mL/min — ABNORMAL LOW
GLUCOSE: 81 mg/dL (ref 70–99)
POTASSIUM: 2.6 meq/L — AB (ref 3.5–5.3)
SODIUM: 145 meq/L (ref 135–145)

## 2013-07-16 MED ORDER — CIPROFLOXACIN HCL 500 MG PO TABS: 500.0000 mg | ORAL_TABLET | Freq: Two times a day (BID) | ORAL | Status: DC

## 2013-07-16 MED ORDER — METRONIDAZOLE 500 MG PO TABS: 500.0000 mg | ORAL_TABLET | Freq: Two times a day (BID) | ORAL | Status: DC

## 2013-07-16 NOTE — Patient Instructions (Signed)
Please complete your lab work prior to leaving. Complete stool kits and return at your earliest convenience. Make sure you are drinking plenty of fluid. Call if symptoms worsen.  Follow up in 1 week.

## 2013-07-16 NOTE — Progress Notes (Signed)
Pre visit review using our clinic review tool, if applicable. No additional management support is needed unless otherwise documented below in the visit note. 

## 2013-07-16 NOTE — Progress Notes (Signed)
Subjective:    Patient ID: Marie Malone, female    DOB: 1951-04-14, 63 y.o.   MRN: [id number]  GI Problem The primary symptoms include fatigue, abdominal pain and diarrhea. Primary symptoms do not include nausea or vomiting.   Marie Malone is a 63 year old female who presents today with a chief complaint of diarrhea, fatigue, and decrease in appetite.  Patient reports watery diarrhea at least three times daily for about four to six weeks since being on antibiotics both in the hospital and at home. Patient has not and is not taking any medications for diarrhea.   Patient reports decrease in appetite and a decreased sensation of taste as well. Patient also reporting generalized fatigue and change in activity level since her stroke in November 2014.    Review of Systems  Constitutional: Positive for appetite change and fatigue.  Respiratory: Negative for shortness of breath.   Cardiovascular: Negative for chest pain.  Gastrointestinal: Positive for abdominal pain and diarrhea. Negative for nausea and vomiting.       Reports intermittent LLQ abdominal pain.  Genitourinary: Negative for difficulty urinating.  Neurological: Negative for headaches.   Past Medical History  Diagnosis Date  . Hypertension   . Stroke   . History of rheumatic fever     as a child  . Hyperlipidemia   . Mitral stenosis   . OSA (obstructive sleep apnea)     mild per sleep study 2008  . Heart murmur   . Complication of anesthesia     difficult to wake up from anesthesia  . Pneumonia     double pneumonia once  . Atrial fibrillation     History   Social History  . Marital Status: Married    Spouse Name: N/A    Number of Children: 2  . Years of Education: N/A   Occupational History  . Not on file.   Social History Main Topics  . Smoking status: Former Smoker -- 1.00 packs/day for 2 years    Types: Cigarettes    Quit date: 05/11/1975  . Smokeless tobacco: Not on file  . Alcohol Use: No   Comment: drank years ago when young  . Drug Use: No  . Sexual Activity: Not on file   Other Topics Concern  . Not on file   Social History Narrative   Retired Archivist   Married   She has 2 grown children-   Daughter lives with her   Son lives in [place]   6 grandchildren    Past Surgical History  Procedure Laterality Date  . Cardiac surgery  2008    "balloon surgery at [facility]"  . Abdominal hysterectomy    . Colonoscopy w/ polypectomy    . Appendectomy  1989    appendix ruptured,had peritonitis  . Tee without cardioversion N/A 04/10/2013    Procedure: TRANSESOPHAGEAL ECHOCARDIOGRAM (TEE);  Surgeon: [name], MD;  Location: [facility] ENDOSCOPY;  Service: Cardiovascular;  Laterality: N/A;  . Loop recorder implant  04-10-2013    MDT LinQ implanted by Dr Rayann Heman for cryptogenic stroke    Family History  Problem Relation Age of Onset  . Diabetes Mother   . Stroke Mother   . Diabetes Father   . Heart disease Father     CHF  . Cancer Father     kidney  . Diabetes Sister   . Diabetes Brother   . Hypertension Daughter     No Known Allergies  Current  Outpatient Prescriptions on File Prior to Visit  Medication Sig Dispense Refill  . amLODipine (NORVASC) 10 MG tablet Take 1 tablet (10 mg total) by mouth daily.  30 tablet  5  . atorvastatin (LIPITOR) 80 MG tablet Take 1 tablet (80 mg total) by mouth daily at 6 PM.  30 tablet  2  . metoprolol succinate (TOPROL-XL) 25 MG 24 hr tablet Take 1 tablet (25 mg total) by mouth daily.  30 tablet  5  . warfarin (COUMADIN) 1 MG tablet Take as directed by anticoagulation clinic  70 tablet  3   No current facility-administered medications on file prior to visit.    BP 122/80  Pulse 77  Temp(Src) 98.4 F (36.9 C) (Oral)  Resp 16  Ht 5\' 3"  (1.6 m)  Wt 155 lb 1.3 oz (70.344 kg)  BMI 27.48 kg/m2  SpO2 99%       Objective:   Physical Exam  Constitutional: She is oriented to person, place, and time. She appears  well-nourished.  Cardiovascular: Normal rate, S1 normal and S2 normal.  An irregularly irregular rhythm present.  Pulses:      Radial pulses are 2+ on the right side, and 2+ on the left side.  Pulmonary/Chest: Breath sounds normal. She is in respiratory distress.  Abdominal: Soft. Bowel sounds are normal. She exhibits no distension. There is tenderness in the left upper quadrant and left lower quadrant. There is no rebound and no guarding.    Neurological: She is alert and oriented to person, place, and time.  Skin: Skin is warm and dry.  Psychiatric: She has a normal mood and affect.          Assessment & Plan:  I have personally seen and examined patient and agree with Marie Ivory NP student's assessment and plan.

## 2013-07-16 NOTE — Assessment & Plan Note (Signed)
Differential includes viral etiology, c-diff, diverticulitis. Ff workup is negative consider sending patient to GI for consult.  Stool culture, c-diff, and O&P labs to be obtained as well as CBC and BMET. CT abdomen/pelvis. Follow up in one week.

## 2013-07-17 ENCOUNTER — Encounter (HOSPITAL_BASED_OUTPATIENT_CLINIC_OR_DEPARTMENT_OTHER): Payer: Self-pay

## 2013-07-17 ENCOUNTER — Ambulatory Visit (HOSPITAL_BASED_OUTPATIENT_CLINIC_OR_DEPARTMENT_OTHER)
Admission: RE | Admit: 2013-07-17 | Discharge: 2013-07-17 | Disposition: A | Discharge status: Home / Self Care | Payer: BC Managed Care – PPO | Source: Ambulatory Visit | Attending: Family | Admitting: Family

## 2013-07-17 ENCOUNTER — Emergency Department (HOSPITAL_BASED_OUTPATIENT_CLINIC_OR_DEPARTMENT_OTHER)
Admission: EM | Admit: 2013-07-17 | Discharge: 2013-07-17 | Disposition: A | Discharge status: Home / Self Care | Payer: BC Managed Care – PPO | Attending: Emergency Medicine | Admitting: Emergency Medicine

## 2013-07-17 ENCOUNTER — Telehealth: Payer: Self-pay | Admitting: *Deleted

## 2013-07-17 ENCOUNTER — Other Ambulatory Visit: Payer: Self-pay | Admitting: Family

## 2013-07-17 ENCOUNTER — Encounter (HOSPITAL_BASED_OUTPATIENT_CLINIC_OR_DEPARTMENT_OTHER): Payer: Self-pay | Admitting: Emergency Medicine

## 2013-07-17 ENCOUNTER — Telehealth: Payer: Self-pay | Admitting: Family

## 2013-07-17 DIAGNOSIS — Z79899 Other long term (current) drug therapy: Secondary | ICD-10-CM | POA: Insufficient documentation

## 2013-07-17 DIAGNOSIS — Z8669 Personal history of other diseases of the nervous system and sense organs: Secondary | ICD-10-CM | POA: Insufficient documentation

## 2013-07-17 DIAGNOSIS — R197 Diarrhea, unspecified: Secondary | ICD-10-CM

## 2013-07-17 DIAGNOSIS — E876 Hypokalemia: Secondary | ICD-10-CM

## 2013-07-17 DIAGNOSIS — Z7901 Long term (current) use of anticoagulants: Secondary | ICD-10-CM | POA: Insufficient documentation

## 2013-07-17 DIAGNOSIS — R1032 Left lower quadrant pain: Secondary | ICD-10-CM | POA: Insufficient documentation

## 2013-07-17 DIAGNOSIS — R10814 Left lower quadrant abdominal tenderness: Secondary | ICD-10-CM

## 2013-07-17 DIAGNOSIS — Z8701 Personal history of pneumonia (recurrent): Secondary | ICD-10-CM | POA: Insufficient documentation

## 2013-07-17 DIAGNOSIS — E785 Hyperlipidemia, unspecified: Secondary | ICD-10-CM | POA: Insufficient documentation

## 2013-07-17 DIAGNOSIS — I10 Essential (primary) hypertension: Secondary | ICD-10-CM | POA: Insufficient documentation

## 2013-07-17 DIAGNOSIS — D649 Anemia, unspecified: Secondary | ICD-10-CM

## 2013-07-17 DIAGNOSIS — R011 Cardiac murmur, unspecified: Secondary | ICD-10-CM | POA: Insufficient documentation

## 2013-07-17 DIAGNOSIS — I4891 Unspecified atrial fibrillation: Secondary | ICD-10-CM | POA: Insufficient documentation

## 2013-07-17 DIAGNOSIS — Z8673 Personal history of transient ischemic attack (TIA), and cerebral infarction without residual deficits: Secondary | ICD-10-CM | POA: Insufficient documentation

## 2013-07-17 DIAGNOSIS — Z87891 Personal history of nicotine dependence: Secondary | ICD-10-CM | POA: Insufficient documentation

## 2013-07-17 LAB — BASIC METABOLIC PANEL
BUN: 11 mg/dL (ref 6–23)
CALCIUM: 7.9 mg/dL — AB (ref 8.4–10.5)
CO2: 25 meq/L (ref 19–32)
Chloride: 98 mEq/L (ref 96–112)
Creatinine, Ser: 1.2 mg/dL — ABNORMAL HIGH (ref 0.50–1.10)
GFR, EST AFRICAN AMERICAN: 55 mL/min — AB (ref >90)
GFR, EST NON AFRICAN AMERICAN: 48 mL/min — AB (ref >90)
Glucose, Bld: 91 mg/dL (ref 70–99)
Potassium: 2.7 mEq/L — CL (ref 3.7–5.3)
Sodium: 141 mEq/L (ref 137–147)

## 2013-07-17 MED ORDER — POTASSIUM CHLORIDE CRYS ER 20 MEQ PO TBCR
40.0000 meq | EXTENDED_RELEASE_TABLET | Freq: Once | ORAL | Status: AC
  Administered 2013-07-17: 40 meq via ORAL
  Filled 2013-07-17: qty 2

## 2013-07-17 MED ORDER — POTASSIUM CHLORIDE CRYS ER 20 MEQ PO TBCR: EXTENDED_RELEASE_TABLET | ORAL | Status: DC

## 2013-07-17 MED ORDER — IOHEXOL 300 MG/ML  SOLN: 100.0000 mL | Freq: Once | INTRAMUSCULAR | Status: DC | PRN

## 2013-07-17 MED ORDER — POTASSIUM CHLORIDE ER 10 MEQ PO TBCR: 20.0000 meq | EXTENDED_RELEASE_TABLET | Freq: Two times a day (BID) | ORAL | Status: DC

## 2013-07-17 MED ORDER — SODIUM CHLORIDE 0.9 % IV BOLUS (SEPSIS)
1000.0000 mL | Freq: Once | INTRAVENOUS | Status: AC
  Administered 2013-07-17: 1000 mL via INTRAVENOUS

## 2013-07-17 MED ORDER — POTASSIUM CHLORIDE 10 MEQ/100ML IV SOLN
10.0000 meq | Freq: Once | INTRAVENOUS | Status: AC
  Administered 2013-07-17: 10 meq via INTRAVENOUS
  Filled 2013-07-17: qty 100

## 2013-07-17 NOTE — Telephone Encounter (Signed)
Also, she is mildly anemic. I would like her to complete IFOB dx anemia please.

## 2013-07-17 NOTE — ED Notes (Signed)
[facility] office called and sts they are sending pt here due to diarrhea and potassium level 2.6 yesterday.

## 2013-07-17 NOTE — ED Notes (Signed)
Pt states she has had diarrhea for several days she saw Livingston Diones and had labs drawn also came in today for ct scan pt has had 2 episodes of severe diarrhea while at ct scan they called Marie Malone and she had pt sent here for further evaluation. She had a low potassium yesterday was prescribed KDur pt has not started taking it yet

## 2013-07-17 NOTE — Telephone Encounter (Signed)
Potassium is very low likely due to recent diarrhea.  I would like her to pick up potassium rx today.  She should take first dose ASAP, second dose in 4 hours, third dose in 8 hours.  Come to lab tomorrow and repeat BMET and magnesium level- dx is hypokalemia.

## 2013-07-17 NOTE — ED Provider Notes (Signed)
CSN: [id number]     Arrival date & time 07/17/13  1030 History   First MD Initiated Contact with Patient 07/17/13 1046     Chief Complaint  Patient presents with  . Diarrhea     (Consider location/radiation/quality/duration/timing/severity/associated sxs/prior Treatment) HPI Comments: Patient is a 63 year old female with history of hypertension, hypertension, CVA. She was recently admitted and diagnosed with endocarditis. She has been on IV antibiotics for approximately 6 weeks. She developed diarrhea towards the end of the antibiotic course which has persisted. She was seen by her primary Dr. yesterday and had a CT scan and labs ordered. She returned today for CT scan is having persistent diarrhea. She was informed that her potassium was low and that she should come to the ER to be evaluated.  Patient is a 63 y.o. female presenting with diarrhea. The history is provided by the patient.  Diarrhea Quality:  Watery Severity:  Moderate Onset quality:  Gradual Duration:  4 weeks Timing:  Constant Progression:  Worsening Relieved by:  Nothing Worsened by:  Nothing tried Ineffective treatments:  None tried Associated symptoms: no abdominal pain, no chills and no fever   Risk factors: recent antibiotic use     Past Medical History  Diagnosis Date  . Hypertension   . Stroke   . History of rheumatic fever     as a child  . Hyperlipidemia   . Mitral stenosis   . OSA (obstructive sleep apnea)     mild per sleep study 2008  . Heart murmur   . Complication of anesthesia     difficult to wake up from anesthesia  . Pneumonia     double pneumonia once  . Atrial fibrillation    Past Surgical History  Procedure Laterality Date  . Cardiac surgery  2008    "balloon surgery at [facility]"  . Abdominal hysterectomy    . Colonoscopy w/ polypectomy    . Appendectomy  1989    appendix ruptured,had peritonitis  . Tee without cardioversion N/A 04/10/2013    Procedure: TRANSESOPHAGEAL  ECHOCARDIOGRAM (TEE);  Surgeon: [name], MD;  Location: [facility] ENDOSCOPY;  Service: Cardiovascular;  Laterality: N/A;  . Loop recorder implant  04-10-2013    MDT LinQ implanted by Dr Rayann Heman for cryptogenic stroke   Family History  Problem Relation Age of Onset  . Diabetes Mother   . Stroke Mother   . Diabetes Father   . Heart disease Father     CHF  . Cancer Father     kidney  . Diabetes Sister   . Diabetes Brother   . Hypertension Daughter    History  Substance Use Topics  . Smoking status: Former Smoker -- 1.00 packs/day for 2 years    Types: Cigarettes    Quit date: 05/11/1975  . Smokeless tobacco: Not on file  . Alcohol Use: No     Comment: drank years ago when young   OB History   Grav Para Term Preterm Abortions TAB SAB Ect Mult Living                 Review of Systems  Constitutional: Negative for fever and chills.  Gastrointestinal: Positive for diarrhea. Negative for abdominal pain.  All other systems reviewed and are negative.      Allergies  Review of patient's allergies indicates no known allergies.  Home Medications   Current Outpatient Rx  Name  Route  Sig  Dispense  Refill  . amLODipine (NORVASC) 10 MG tablet  Oral   Take 1 tablet (10 mg total) by mouth daily.   30 tablet   5   . atorvastatin (LIPITOR) 80 MG tablet   Oral   Take 1 tablet (80 mg total) by mouth daily at 6 PM.   30 tablet   2   . ciprofloxacin (CIPRO) 500 MG tablet   Oral   Take 1 tablet (500 mg total) by mouth 2 (two) times daily.   20 tablet   0   . metoprolol succinate (TOPROL-XL) 25 MG 24 hr tablet   Oral   Take 1 tablet (25 mg total) by mouth daily.   30 tablet   5   . metroNIDAZOLE (FLAGYL) 500 MG tablet   Oral   Take 1 tablet (500 mg total) by mouth 2 (two) times daily.   20 tablet   0   . potassium chloride SA (K-DUR,KLOR-CON) 20 MEQ tablet      2 tabs by mouth now, then 2 tabs in 4 hours and 2 tabs in 8 hours   15 tablet   0   . warfarin  (COUMADIN) 1 MG tablet      Take as directed by anticoagulation clinic   70 tablet   3     30 day supply    BP 112/58  Pulse 87  Temp(Src) 98.5 F (36.9 C) (Oral)  Resp 18  Ht 5\' 2"  (1.575 m)  Wt 155 lb (70.308 kg)  BMI 28.34 kg/m2  SpO2 100% Physical Exam  Nursing note and vitals reviewed. Constitutional: She is oriented to person, place, and time. She appears well-developed and well-nourished. No distress.  HENT:  Head: Normocephalic and atraumatic.  Neck: Normal range of motion. Neck supple.  Cardiovascular: Normal rate and regular rhythm.  Exam reveals no gallop and no friction rub.   No murmur heard. Pulmonary/Chest: Effort normal and breath sounds normal. No respiratory distress. She has no wheezes.  Abdominal: Soft. Bowel sounds are normal. She exhibits no distension. There is tenderness. There is no rebound and no guarding.  There is mild tenderness to palpation in the left lower quadrant with no rebound and no guarding.  Musculoskeletal: Normal range of motion.  Neurological: She is alert and oriented to person, place, and time.  Skin: Skin is warm and dry. She is not diaphoretic.    ED Course  Procedures (including critical care time) Labs Review Labs Reviewed - No data to display Imaging Review Ct Abdomen Pelvis Wo Contrast  07/17/2013   CLINICAL DATA:  Diarrhea and left lower quadrant tenderness  EXAM: CT ABDOMEN AND PELVIS WITHOUT CONTRAST  TECHNIQUE: Multidetector CT imaging of the abdomen and pelvis was performed following the standard protocol without intravenous contrast.  COMPARISON:  None.  FINDINGS: BODY WALL: Unremarkable.  LOWER CHEST: Mild cardiomegaly. Mitral and aortic annular calcification. Trace pericardial fluid or thickening. Calcified granuloma at the right base  ABDOMEN/PELVIS:  Liver: No focal abnormality.  Biliary: Layering calcified gallstones in the non dilated , noninflamed gallbladder.  Pancreas: Unremarkable.  Spleen: Unremarkable.   Adrenals: Unremarkable.  Kidneys and ureters: No hydronephrosis or stone.  Bladder: Limited evaluation given decompressed state.  Reproductive: Unremarkable.  Bowel: Liquid stool, compatible with history of diarrhea. There is no bowel wall thickening to suggest bowel inflammation. No obstruction. Suspect appendectomy. No pericecal inflammation.  Retroperitoneum: No mass or adenopathy.  Peritoneum: No free fluid or gas.  Vascular: No acute abnormality.  OSSEOUS: No acute abnormalities.  IMPRESSION: 1. No acute intra-abdominal findings. 2.  Cholelithiasis without cholecystitis.   Electronically Signed   By: Jorje Guild M.D.   On: 07/17/2013 10:49     EKG Interpretation None      MDM   Final diagnoses:  None    Patient is a 63 year old female referred here for evaluation of low potassium and diarrhea. She was seen by her primary Dr. yesterday and was started on Cipro and Flagyl for presumed C. difficile. She denies abdominal pain and her abdominal exam is benign. Workup reveals a potassium of 2.7 which is virtually unchanged from 2.6 yesterday. She was given IV and oral potassium in the ER and appears to be quite stable. She will be discharged with potassium replacement and when necessary followup. She is to return if her symptoms worsen or change.    Veryl Speak, MD 07/17/13 [phone]

## 2013-07-17 NOTE — Telephone Encounter (Signed)
Notified pt and she voices understanding. Scheduled follow up for 07/24/13 at 8am.

## 2013-07-17 NOTE — Telephone Encounter (Signed)
Please call pt and let her know that I reviewed her ED record.  CT does not show diverticulitis.  I would like her to continue metronidazole and stop cipro.  She should follow up in our office in 1 week. Continue K dur 58m MeQ once daily.  Return stool studies as soon as possible if she has not already. Important that she keep coumadin clinic appointment.

## 2013-07-17 NOTE — Telephone Encounter (Signed)
Per verbal from Provider, notified charge nurse in ER that pt is completing CT abdomen and then has been advised to be evaluated in the ER for low Potassium and worsened diarrhea per radiology.  Pt was prescribed potassium supplement this morning but we do not think pt was able to start it yet.

## 2013-07-17 NOTE — Discharge Instructions (Signed)
Potassium as prescribed.  Followup with your Dr. towards the end of the week for a recheck and a potassium checked. Return to the ER if you develop bloody stool, severe abdominal pain, high fever, or other new or concerning symptoms.   Diarrhea Diarrhea is frequent loose and watery bowel movements. It can cause you to feel weak and dehydrated. Dehydration can cause you to become tired and thirsty, have a dry mouth, and have decreased urination that often is dark yellow. Diarrhea is a sign of another problem, most often an infection that will not last long. In most cases, diarrhea typically lasts 2 3 days. However, it can last longer if it is a sign of something more serious. It is important to treat your diarrhea as directed by your caregive to lessen or prevent future episodes of diarrhea. CAUSES  Some common causes include:  Gastrointestinal infections caused by viruses, bacteria, or parasites.  Food poisoning or food allergies.  Certain medicines, such as antibiotics, chemotherapy, and laxatives.  Artificial sweeteners and fructose.  Digestive disorders. HOME CARE INSTRUCTIONS  Ensure adequate fluid intake (hydration): have 1 cup (8 oz) of fluid for each diarrhea episode. Avoid fluids that contain simple sugars or sports drinks, fruit juices, whole milk products, and sodas. Your urine should be clear or pale yellow if you are drinking enough fluids. Hydrate with an oral rehydration solution that you can purchase at pharmacies, retail stores, and online. You can prepare an oral rehydration solution at home by mixing the following ingredients together:    tsp table salt.   tsp baking soda.   tsp salt substitute containing potassium chloride.  1  tablespoons sugar.  1 L (34 oz) of water.  Certain foods and beverages may increase the speed at which food moves through the gastrointestinal (GI) tract. These foods and beverages should be avoided and include:  Caffeinated and alcoholic  beverages.  High-fiber foods, such as raw fruits and vegetables, nuts, seeds, and whole grain breads and cereals.  Foods and beverages sweetened with sugar alcohols, such as xylitol, sorbitol, and mannitol.  Some foods may be well tolerated and may help thicken stool including:  Starchy foods, such as rice, toast, pasta, low-sugar cereal, oatmeal, grits, baked potatoes, crackers, and bagels.  Bananas.  Applesauce.  Add probiotic-rich foods to help increase healthy bacteria in the GI tract, such as yogurt and fermented milk products.  Wash your hands well after each diarrhea episode.  Only take over-the-counter or prescription medicines as directed by your caregiver.  Take a warm bath to relieve any burning or pain from frequent diarrhea episodes. SEEK IMMEDIATE MEDICAL CARE IF:   You are unable to keep fluids down.  You have persistent vomiting.  You have blood in your stool, or your stools are black and tarry.  You do not urinate in 6 8 hours, or there is only a small amount of very dark urine.  You have abdominal pain that increases or localizes.  You have weakness, dizziness, confusion, or lightheadedness.  You have a severe headache.  Your diarrhea gets worse or does not get better.  You have a fever or persistent symptoms for more than 2 3 days.  You have a fever and your symptoms suddenly get worse. MAKE SURE YOU:   Understand these instructions.  Will watch your condition.  Will get help right away if you are not doing well or get worse. Document Released: 04/16/2002 Document Revised: 04/12/2012 Document Reviewed: 01/02/2012 [facility] Patient Information 2014 [place], [place].

## 2013-07-17 NOTE — Telephone Encounter (Signed)
Notified pt and she voices understanding.  Lab order entered and IFOB placed at front desk for pick up.

## 2013-07-18 ENCOUNTER — Telehealth: Payer: Self-pay | Admitting: Family

## 2013-07-18 LAB — OVA AND PARASITE EXAMINATION: OP: NONE SEEN

## 2013-07-18 LAB — CLOSTRIDIUM DIFFICILE BY PCR: CDIFFPCR: DETECTED — AB

## 2013-07-18 MED ORDER — METRONIDAZOLE 500 MG PO TABS: 500.0000 mg | ORAL_TABLET | Freq: Three times a day (TID) | ORAL | Status: DC

## 2013-07-18 NOTE — Telephone Encounter (Signed)
Left message on voicemail to return my call.  

## 2013-07-18 NOTE — Telephone Encounter (Signed)
Lab called and reports + C .Diff. Please notify pt that she has overgrowth of bad bacteria in her colon. This is likely related to recent prolonged antibiotics.  I would like her to add a probiotic such as florastor one cap twice daily and continue flagyl. Flagyl should be given 3 times daily for 14 days. Original rx was for 2 times daily. I am sending additional abx to her pharmacy as she will not have enough.  Follow up on 3/17 as scheduled.

## 2013-07-19 NOTE — Telephone Encounter (Signed)
Patient returning your call.

## 2013-07-20 NOTE — Telephone Encounter (Signed)
Notified pt and she voices understanding. 

## 2013-07-21 LAB — STOOL CULTURE

## 2013-07-23 ENCOUNTER — Ambulatory Visit (INDEPENDENT_AMBULATORY_CARE_PROVIDER_SITE_OTHER): Payer: BC Managed Care – PPO

## 2013-07-23 DIAGNOSIS — I4891 Unspecified atrial fibrillation: Secondary | ICD-10-CM

## 2013-07-23 DIAGNOSIS — Z5181 Encounter for therapeutic drug level monitoring: Secondary | ICD-10-CM

## 2013-07-23 DIAGNOSIS — I6350 Cerebral infarction due to unspecified occlusion or stenosis of unspecified cerebral artery: Secondary | ICD-10-CM

## 2013-07-23 DIAGNOSIS — I639 Cerebral infarction, unspecified: Secondary | ICD-10-CM

## 2013-07-23 LAB — POCT INR: INR: 3.9

## 2013-07-24 ENCOUNTER — Ambulatory Visit (INDEPENDENT_AMBULATORY_CARE_PROVIDER_SITE_OTHER): Payer: BC Managed Care – PPO | Admitting: Family

## 2013-07-24 ENCOUNTER — Telehealth: Payer: Self-pay | Admitting: Family

## 2013-07-24 ENCOUNTER — Encounter: Payer: Self-pay | Admitting: Family

## 2013-07-24 VITALS — BP 104/64 | HR 76 | Temp 97.5°F | Ht 63.0 in | Wt 155.1 lb

## 2013-07-24 DIAGNOSIS — I10 Essential (primary) hypertension: Secondary | ICD-10-CM

## 2013-07-24 DIAGNOSIS — A0472 Enterocolitis due to Clostridium difficile, not specified as recurrent: Secondary | ICD-10-CM

## 2013-07-24 DIAGNOSIS — E876 Hypokalemia: Secondary | ICD-10-CM

## 2013-07-24 LAB — BASIC METABOLIC PANEL
BUN: 6 mg/dL (ref 6–23)
CALCIUM: 8.7 mg/dL (ref 8.4–10.5)
CO2: 28 meq/L (ref 19–32)
CREATININE: 1.08 mg/dL (ref 0.50–1.10)
Chloride: 103 mEq/L (ref 96–112)
Glucose, Bld: 78 mg/dL (ref 70–99)
Potassium: 3.3 mEq/L — ABNORMAL LOW (ref 3.5–5.3)
Sodium: 141 mEq/L (ref 135–145)

## 2013-07-24 MED ORDER — METRONIDAZOLE 500 MG PO TABS: 500.0000 mg | ORAL_TABLET | Freq: Three times a day (TID) | ORAL | Status: DC

## 2013-07-24 MED ORDER — CHOLESTYRAMINE 4 G PO PACK: 4.0000 g | PACK | Freq: Three times a day (TID) | ORAL | Status: DC

## 2013-07-24 NOTE — Progress Notes (Signed)
Subjective:    Patient ID: Marie Malone, female    DOB: 1951-04-01, 63 y.o.   MRN: [id number]  HPI  Ms. Marie Malone is a 63 yr old female who presents today for ER follow up.  ED records are reviewed.  She was seen on 07/17/13 with chief complaint of diarrhea.  She was noted to be significantly hypokalemic with a potassium of 2.7.  She was discharged home with a potassium supplement. She was noted on CT scan to have cholelithiasis without cholecystitis. She continues to have multiple loose stools. Denies associated fever.  Reports that the diarrhea is "uncontrollable."  Denies associated abdominal pain. She denies associated nausea/vomitting.  Reports anorexia. At an apple last night.  She is taking in water.    Wt Readings from Last 3 Encounters:  07/24/13 155 lb 1.3 oz (70.344 kg)  07/17/13 155 lb (70.308 kg)  07/16/13 155 lb 1.3 oz (70.344 kg)      Review of Systems See HPI  Past Medical History  Diagnosis Date  . Hypertension   . Stroke   . History of rheumatic fever     as a child  . Hyperlipidemia   . Mitral stenosis   . OSA (obstructive sleep apnea)     mild per sleep study 2008  . Heart murmur   . Complication of anesthesia     difficult to wake up from anesthesia  . Pneumonia     double pneumonia once  . Atrial fibrillation     History   Social History  . Marital Status: Married    Spouse Name: Marie Malone    Number of Children: 2  . Years of Education: Marie Malone   Occupational History  . Not on file.   Social History Main Topics  . Smoking status: Former Smoker -- 1.00 packs/day for 2 years    Types: Cigarettes    Quit date: 05/11/1975  . Smokeless tobacco: Not on file  . Alcohol Use: No     Comment: drank years ago when young  . Drug Use: No  . Sexual Activity: Not on file   Other Topics Concern  . Not on file   Social History Narrative   Retired Archivist   Married   She has 2 grown children-   Daughter lives with her   Son lives in [place]   6 grandchildren    Past Surgical History  Procedure Laterality Date  . Cardiac surgery  2008    "balloon surgery at [facility]"  . Abdominal hysterectomy    . Colonoscopy w/ polypectomy    . Appendectomy  1989    appendix ruptured,had peritonitis  . Tee without cardioversion Marie Malone 04/10/2013    Procedure: TRANSESOPHAGEAL ECHOCARDIOGRAM (TEE);  Surgeon: [name], MD;  Location: [facility] ENDOSCOPY;  Service: Cardiovascular;  Laterality: Marie Malone;  . Loop recorder implant  04-10-2013    MDT LinQ implanted by Dr Rayann Heman for cryptogenic stroke    Family History  Problem Relation Age of Onset  . Diabetes Mother   . Stroke Mother   . Diabetes Father   . Heart disease Father     CHF  . Cancer Father     kidney  . Diabetes Sister   . Diabetes Brother   . Hypertension Daughter     No Known Allergies  Current Outpatient Prescriptions on File Prior to Visit  Medication Sig Dispense Refill  . amLODipine (NORVASC) 10 MG tablet Take 1 tablet (10 mg total) by  mouth daily.  30 tablet  5  . atorvastatin (LIPITOR) 80 MG tablet Take 1 tablet (80 mg total) by mouth daily at 6 PM.  30 tablet  2  . metoprolol succinate (TOPROL-XL) 25 MG 24 hr tablet Take 1 tablet (25 mg total) by mouth daily.  30 tablet  5  . potassium chloride SA (K-DUR,KLOR-CON) 20 MEQ tablet Take 20 mEq by mouth daily.      [name] Kitchen warfarin (COUMADIN) 1 MG tablet Take as directed by anticoagulation clinic  70 tablet  3  . metroNIDAZOLE (FLAGYL) 500 MG tablet Take 1 tablet (500 mg total) by mouth 3 (three) times daily.  21 tablet  0  . potassium chloride (K-DUR) 10 MEQ tablet Take 2 tablets (20 mEq total) by mouth 2 (two) times daily.  20 tablet  0  . saccharomyces boulardii (FLORASTOR) 250 MG capsule Take 250 mg by mouth.       No current facility-administered medications on file prior to visit.    BP 104/64  Pulse 76  Temp(Src) 97.5 F (36.4 C) (Oral)  Ht 5\' 3"  (1.6 m)  Wt 155 lb 1.3 oz (70.344 kg)  BMI 27.48 kg/m2  SpO2  99%       Objective:   Physical Exam  Constitutional: She is oriented to person, place, and time. She appears well-developed and well-nourished. No distress.  Cardiovascular: Normal rate, regular rhythm and intact distal pulses.   No murmur heard. Pulmonary/Chest: Effort normal and breath sounds normal. No respiratory distress. She has no wheezes. She has no rales. She exhibits no tenderness.  Abdominal: Soft. Bowel sounds are decreased. There is no rigidity, no rebound and no guarding.  + generalized abdominal tenderness without guarding.  Musculoskeletal: She exhibits no edema.  Neurological: She is alert and oriented to person, place, and time.  Psychiatric: She has a normal mood and affect. Her behavior is normal. Judgment and thought content normal.          Assessment & Plan:

## 2013-07-24 NOTE — Telephone Encounter (Signed)
Potassium better but still mildly low.  Take potassium 40 mEQ 3 times daily on 3/18, then go back to 40 mEQ bid. Follow up next week as scheduled.

## 2013-07-24 NOTE — Assessment & Plan Note (Signed)
Continue potassium supplement, obtain bmet.

## 2013-07-24 NOTE — Progress Notes (Signed)
Pre visit review using our clinic review tool, if applicable. No additional management support is needed unless otherwise documented below in the visit note. 

## 2013-07-24 NOTE — Patient Instructions (Signed)
Start questran for diarrhea. Continue florastor (Probiotic for your bowels) Make sure you are drinking plenty of fluids to avoid becoming dehydrated. Continue metronidazole 3 times daily- I have sent an additional 7 days to your pharmacy. I would like you to continue for a total of 14 days. Continue potassium supplement.  Call if you develop increased weakness, if diarrhea worsens, or if diarrhea does not ease up over the next few days. Follow up in 1 week.   Clostridium Difficile Infection Clostridium difficile (C. difficile) is a germ found in the intestines. C. difficile infection can occur after taking some medicines. C. difficile infection can cause watery poop (diarrhea) or severe disease. HOME CARE   Drink enough fluids to keep your pee (urine) clear or pale yellow. Avoid milk, caffeine, and alcohol.  Ask your doctor how to replace body fluid losses (rehydrate).  Eat small meals more often rather than large meals.  Take your medicine (antibiotics) as told. Finish it even if you start to feel better.  Do not  use medicines to slow the watery poop.  Wash your hands well after using the bathroom and before preparing food.  Make sure people who live with you wash their hands often.  Clean all surfaces. Use a product that contains chlorine bleach. GET HELP RIGHT AWAY IF:   The watery poop does not stop, or it comes back after you finish your medicine.  You feel very dry or thirsty (dehydrated).  You have a fever.  You have more belly (abdominal) pain or tenderness.  There is blood in your poop (stool), or your poop is black and tar-like.  You cannot eat food or drink liquids without throwing up (vomiting). MAKE SURE YOU:   Understand these instructions.  Will watch your condition.  Will get help right away if you are not doing well or get worse. Document Released: 02/21/2009 Document Revised: 08/21/2012 Document Reviewed: 10/02/2010 [facility] Patient Information  2014 [place], [place].

## 2013-07-24 NOTE — Assessment & Plan Note (Signed)
Continues to have severe diarrhea. Recommended that she add questran to help with diarrhea, continue probiotic, extend metronidazole x 14 days total.  Increase hydration to avoid dehydration.

## 2013-07-25 ENCOUNTER — Telehealth: Payer: Self-pay | Admitting: Family

## 2013-07-25 MED ORDER — POTASSIUM CHLORIDE CRYS ER 20 MEQ PO TBCR: 40.0000 meq | EXTENDED_RELEASE_TABLET | Freq: Two times a day (BID) | ORAL | Status: DC

## 2013-07-25 NOTE — Telephone Encounter (Signed)
Relevant patient education assigned to patient using Emmi. ° °

## 2013-07-25 NOTE — Telephone Encounter (Signed)
Notified pt and she voices understanding and requests refill be sent to pharmacy. Rx sent.

## 2013-07-30 ENCOUNTER — Ambulatory Visit (INDEPENDENT_AMBULATORY_CARE_PROVIDER_SITE_OTHER): Payer: BC Managed Care – PPO | Admitting: *Deleted

## 2013-07-30 DIAGNOSIS — I4891 Unspecified atrial fibrillation: Secondary | ICD-10-CM

## 2013-07-30 DIAGNOSIS — Z5181 Encounter for therapeutic drug level monitoring: Secondary | ICD-10-CM

## 2013-07-30 DIAGNOSIS — I6350 Cerebral infarction due to unspecified occlusion or stenosis of unspecified cerebral artery: Secondary | ICD-10-CM

## 2013-07-30 DIAGNOSIS — I639 Cerebral infarction, unspecified: Secondary | ICD-10-CM

## 2013-07-30 LAB — POCT INR: INR: 5

## 2013-07-31 ENCOUNTER — Encounter: Payer: Self-pay | Admitting: Family

## 2013-07-31 ENCOUNTER — Ambulatory Visit (INDEPENDENT_AMBULATORY_CARE_PROVIDER_SITE_OTHER): Payer: BC Managed Care – PPO | Admitting: Family

## 2013-07-31 VITALS — BP 120/90 | HR 77 | Temp 97.9°F | Ht 63.0 in | Wt 154.1 lb

## 2013-07-31 DIAGNOSIS — E876 Hypokalemia: Secondary | ICD-10-CM

## 2013-07-31 DIAGNOSIS — A0472 Enterocolitis due to Clostridium difficile, not specified as recurrent: Secondary | ICD-10-CM

## 2013-07-31 LAB — BASIC METABOLIC PANEL
BUN: 2 mg/dL — AB (ref 6–23)
CO2: 27 mEq/L (ref 19–32)
Calcium: 8.4 mg/dL (ref 8.4–10.5)
Chloride: 104 mEq/L (ref 96–112)
Creat: 0.92 mg/dL (ref 0.50–1.10)
Glucose, Bld: 82 mg/dL (ref 70–99)
POTASSIUM: 3.4 meq/L — AB (ref 3.5–5.3)
Sodium: 141 mEq/L (ref 135–145)

## 2013-07-31 MED ORDER — ATORVASTATIN CALCIUM 80 MG PO TABS: 80.0000 mg | ORAL_TABLET | Freq: Every day | ORAL | Status: DC

## 2013-07-31 MED ORDER — AMLODIPINE BESYLATE 10 MG PO TABS
10.0000 mg | ORAL_TABLET | Freq: Every day | ORAL | Status: DC
Start: 2013-07-31 — End: 2013-09-19

## 2013-07-31 NOTE — Assessment & Plan Note (Signed)
Clinically improving, complete metronidazole, continue probiotic, check follow up stool for C. Diff.

## 2013-07-31 NOTE — Patient Instructions (Signed)
Please complete lab work prior to leaving. Complete stool sample and return at your earliest convenience. Call if diarrhea does not continue to resolved or if appetite does not continue to improve. Complete metronidazole, continue probiotic. Follow up in 1 month.

## 2013-07-31 NOTE — Progress Notes (Signed)
Subjective:    Patient ID: Marie Malone, female    DOB: 09/27/1950, 63 y.o.   MRN: [id number]  HPI  Pt presents today for follow up.  Last week she was seen for ED follow up of hypokalemia, diarrhea, and C. Diff colitis. She was continued on metronidazole and questran was added.  Follow up potassium level was improved at 3.3.  She reports 3 loose stools a day. Reports that she is tolerating liquids but eating very little due to anorexia. Reports that she has not been using [place].  Wt Readings from Last 3 Encounters:  07/31/13 154 lb 1.3 oz (69.89 kg)  07/24/13 155 lb 1.3 oz (70.344 kg)  07/17/13 155 lb (70.308 kg)     Review of Systems See HPI  Past Medical History  Diagnosis Date  . Hypertension   . Stroke   . History of rheumatic fever     as a child  . Hyperlipidemia   . Mitral stenosis   . OSA (obstructive sleep apnea)     mild per sleep study 2008  . Heart murmur   . Complication of anesthesia     difficult to wake up from anesthesia  . Pneumonia     double pneumonia once  . Atrial fibrillation     History   Social History  . Marital Status: Married    Spouse Name: N/A    Number of Children: 2  . Years of Education: N/A   Occupational History  . Not on file.   Social History Main Topics  . Smoking status: Former Smoker -- 1.00 packs/day for 2 years    Types: Cigarettes    Quit date: 05/11/1975  . Smokeless tobacco: Not on file  . Alcohol Use: No     Comment: drank years ago when young  . Drug Use: No  . Sexual Activity: Not on file   Other Topics Concern  . Not on file   Social History Narrative   Retired Archivist   Married   She has 2 grown children-   Daughter lives with her   Son lives in [place]   6 grandchildren    Past Surgical History  Procedure Laterality Date  . Cardiac surgery  2008    "balloon surgery at [facility]"  . Abdominal hysterectomy    . Colonoscopy w/ polypectomy    . Appendectomy  1989   appendix ruptured,had peritonitis  . Tee without cardioversion N/A 04/10/2013    Procedure: TRANSESOPHAGEAL ECHOCARDIOGRAM (TEE);  Surgeon: [name], MD;  Location: [facility] ENDOSCOPY;  Service: Cardiovascular;  Laterality: N/A;  . Loop recorder implant  04-10-2013    MDT LinQ implanted by Dr Rayann Heman for cryptogenic stroke    Family History  Problem Relation Age of Onset  . Diabetes Mother   . Stroke Mother   . Diabetes Father   . Heart disease Father     CHF  . Cancer Father     kidney  . Diabetes Sister   . Diabetes Brother   . Hypertension Daughter     No Known Allergies  Current Outpatient Prescriptions on File Prior to Visit  Medication Sig Dispense Refill  . amLODipine (NORVASC) 10 MG tablet Take 1 tablet (10 mg total) by mouth daily.  30 tablet  5  . atorvastatin (LIPITOR) 80 MG tablet Take 1 tablet (80 mg total) by mouth daily at 6 PM.  30 tablet  2  . cholestyramine (QUESTRAN) 4 G packet Take  1 packet (4 g total) by mouth 3 (three) times daily with meals.  30 each  0  . metoprolol succinate (TOPROL-XL) 25 MG 24 hr tablet Take 1 tablet (25 mg total) by mouth daily.  30 tablet  5  . metroNIDAZOLE (FLAGYL) 500 MG tablet Take 1 tablet (500 mg total) by mouth 3 (three) times daily.  21 tablet  0  . potassium chloride (K-DUR) 10 MEQ tablet Take 2 tablets (20 mEq total) by mouth 2 (two) times daily.  20 tablet  0  . potassium chloride SA (K-DUR,KLOR-CON) 20 MEQ tablet Take 2 tablets (40 mEq total) by mouth 2 (two) times daily.  120 tablet  0  . saccharomyces boulardii (FLORASTOR) 250 MG capsule Take 250 mg by mouth.      . warfarin (COUMADIN) 1 MG tablet Take as directed by anticoagulation clinic  70 tablet  3   No current facility-administered medications on file prior to visit.    BP 120/90  Pulse 77  Temp(Src) 97.9 F (36.6 C) (Oral)  Ht 5\' 3"  (1.6 m)  Wt 154 lb 1.3 oz (69.89 kg)  BMI 27.30 kg/m2  SpO2 99%       Objective:   Physical Exam  Constitutional: She  is oriented to person, place, and time. She appears well-developed and well-nourished. No distress.  Cardiovascular: Normal rate and regular rhythm.   No murmur heard. Pulmonary/Chest: Effort normal and breath sounds normal. No respiratory distress. She has no wheezes. She has no rales. She exhibits no tenderness.  Abdominal: Soft.  Mild abdominal tenderness to palpation without guarding or rebound tenderness.  Musculoskeletal: She exhibits no edema.  Lymphadenopathy:    She has no cervical adenopathy.  Neurological: She is alert and oriented to person, place, and time.  Psychiatric: She has a normal mood and affect. Her behavior is normal. Judgment and thought content normal.          Assessment & Plan:

## 2013-07-31 NOTE — Assessment & Plan Note (Signed)
Repeat bmet. She admits to not taking K dur regularly.

## 2013-07-31 NOTE — Progress Notes (Signed)
Pre visit review using our clinic review tool, if applicable. No additional management support is needed unless otherwise documented below in the visit note. 

## 2013-08-01 NOTE — Telephone Encounter (Signed)
Please call pt and let her know potassium still a little low.  Please ask her to take 2 tabs of Kdur today, then make sure to take one every day to keep level up.

## 2013-08-03 NOTE — Telephone Encounter (Signed)
Left message for pt to return my call.

## 2013-08-03 NOTE — Telephone Encounter (Signed)
Left message on home # re: below instructions and to call if any questions.

## 2013-08-06 ENCOUNTER — Ambulatory Visit (INDEPENDENT_AMBULATORY_CARE_PROVIDER_SITE_OTHER): Payer: BC Managed Care – PPO | Admitting: Pharmacist

## 2013-08-06 DIAGNOSIS — I4891 Unspecified atrial fibrillation: Secondary | ICD-10-CM

## 2013-08-06 DIAGNOSIS — I639 Cerebral infarction, unspecified: Secondary | ICD-10-CM

## 2013-08-06 DIAGNOSIS — Z5181 Encounter for therapeutic drug level monitoring: Secondary | ICD-10-CM

## 2013-08-06 DIAGNOSIS — I6350 Cerebral infarction due to unspecified occlusion or stenosis of unspecified cerebral artery: Secondary | ICD-10-CM

## 2013-08-06 LAB — POCT INR: INR: 1.7

## 2013-08-10 ENCOUNTER — Ambulatory Visit (INDEPENDENT_AMBULATORY_CARE_PROVIDER_SITE_OTHER): Payer: BC Managed Care – PPO | Admitting: *Deleted

## 2013-08-10 DIAGNOSIS — I6350 Cerebral infarction due to unspecified occlusion or stenosis of unspecified cerebral artery: Secondary | ICD-10-CM

## 2013-08-10 DIAGNOSIS — I639 Cerebral infarction, unspecified: Secondary | ICD-10-CM

## 2013-08-11 LAB — MDC_IDC_ENUM_SESS_TYPE_REMOTE: MDC IDC SESS DTM: 20150404040500

## 2013-08-13 ENCOUNTER — Ambulatory Visit (INDEPENDENT_AMBULATORY_CARE_PROVIDER_SITE_OTHER): Payer: BC Managed Care – PPO

## 2013-08-13 DIAGNOSIS — I6350 Cerebral infarction due to unspecified occlusion or stenosis of unspecified cerebral artery: Secondary | ICD-10-CM

## 2013-08-13 DIAGNOSIS — Z5181 Encounter for therapeutic drug level monitoring: Secondary | ICD-10-CM

## 2013-08-13 DIAGNOSIS — I639 Cerebral infarction, unspecified: Secondary | ICD-10-CM

## 2013-08-13 DIAGNOSIS — I4891 Unspecified atrial fibrillation: Secondary | ICD-10-CM

## 2013-08-13 LAB — POCT INR: INR: 1.4

## 2013-08-14 ENCOUNTER — Encounter: Payer: Self-pay | Admitting: Internal Medicine

## 2013-08-20 ENCOUNTER — Other Ambulatory Visit: Payer: Self-pay | Admitting: Family

## 2013-08-20 ENCOUNTER — Ambulatory Visit (INDEPENDENT_AMBULATORY_CARE_PROVIDER_SITE_OTHER): Payer: BC Managed Care – PPO | Admitting: Pharmacist

## 2013-08-20 DIAGNOSIS — I639 Cerebral infarction, unspecified: Secondary | ICD-10-CM

## 2013-08-20 DIAGNOSIS — I6350 Cerebral infarction due to unspecified occlusion or stenosis of unspecified cerebral artery: Secondary | ICD-10-CM

## 2013-08-20 DIAGNOSIS — I4891 Unspecified atrial fibrillation: Secondary | ICD-10-CM

## 2013-08-20 DIAGNOSIS — Z5181 Encounter for therapeutic drug level monitoring: Secondary | ICD-10-CM

## 2013-08-20 LAB — POCT INR: INR: 1.2

## 2013-08-20 NOTE — Telephone Encounter (Signed)
Informed patient of medication refill and she states that she will have to call back to reschedule

## 2013-08-20 NOTE — Telephone Encounter (Signed)
Pt is due for 1 month follow up with Melissa after 08/31/13. Please call pt to arrange appt.

## 2013-08-26 LAB — MDC_IDC_ENUM_SESS_TYPE_REMOTE: Date Time Interrogation Session: 20150324040500

## 2013-08-27 ENCOUNTER — Ambulatory Visit (INDEPENDENT_AMBULATORY_CARE_PROVIDER_SITE_OTHER): Payer: BC Managed Care – PPO | Admitting: *Deleted

## 2013-08-27 DIAGNOSIS — Z5181 Encounter for therapeutic drug level monitoring: Secondary | ICD-10-CM

## 2013-08-27 DIAGNOSIS — I6350 Cerebral infarction due to unspecified occlusion or stenosis of unspecified cerebral artery: Secondary | ICD-10-CM

## 2013-08-27 DIAGNOSIS — I4891 Unspecified atrial fibrillation: Secondary | ICD-10-CM

## 2013-08-27 DIAGNOSIS — I639 Cerebral infarction, unspecified: Secondary | ICD-10-CM

## 2013-08-27 LAB — POCT INR: INR: 1.2

## 2013-08-28 ENCOUNTER — Encounter: Payer: Self-pay | Admitting: Internal Medicine

## 2013-09-03 ENCOUNTER — Ambulatory Visit (INDEPENDENT_AMBULATORY_CARE_PROVIDER_SITE_OTHER): Payer: BC Managed Care – PPO

## 2013-09-03 DIAGNOSIS — Z5181 Encounter for therapeutic drug level monitoring: Secondary | ICD-10-CM

## 2013-09-03 DIAGNOSIS — I6350 Cerebral infarction due to unspecified occlusion or stenosis of unspecified cerebral artery: Secondary | ICD-10-CM

## 2013-09-03 DIAGNOSIS — I4891 Unspecified atrial fibrillation: Secondary | ICD-10-CM

## 2013-09-03 DIAGNOSIS — I639 Cerebral infarction, unspecified: Secondary | ICD-10-CM

## 2013-09-03 LAB — POCT INR: INR: 1.2

## 2013-09-11 ENCOUNTER — Encounter: Payer: Self-pay | Admitting: Internal Medicine

## 2013-09-12 ENCOUNTER — Ambulatory Visit (INDEPENDENT_AMBULATORY_CARE_PROVIDER_SITE_OTHER): Payer: BC Managed Care – PPO | Admitting: *Deleted

## 2013-09-12 DIAGNOSIS — I639 Cerebral infarction, unspecified: Secondary | ICD-10-CM

## 2013-09-12 DIAGNOSIS — I6350 Cerebral infarction due to unspecified occlusion or stenosis of unspecified cerebral artery: Secondary | ICD-10-CM

## 2013-09-13 ENCOUNTER — Encounter: Payer: Self-pay | Admitting: Cardiology

## 2013-09-13 ENCOUNTER — Ambulatory Visit (INDEPENDENT_AMBULATORY_CARE_PROVIDER_SITE_OTHER): Payer: BC Managed Care – PPO | Admitting: Cardiology

## 2013-09-13 ENCOUNTER — Ambulatory Visit (INDEPENDENT_AMBULATORY_CARE_PROVIDER_SITE_OTHER): Payer: BC Managed Care – PPO

## 2013-09-13 VITALS — BP 116/70 | HR 61 | Ht 63.0 in | Wt 154.0 lb

## 2013-09-13 DIAGNOSIS — I050 Rheumatic mitral stenosis: Secondary | ICD-10-CM

## 2013-09-13 DIAGNOSIS — I6350 Cerebral infarction due to unspecified occlusion or stenosis of unspecified cerebral artery: Secondary | ICD-10-CM

## 2013-09-13 DIAGNOSIS — Z5181 Encounter for therapeutic drug level monitoring: Secondary | ICD-10-CM

## 2013-09-13 DIAGNOSIS — E785 Hyperlipidemia, unspecified: Secondary | ICD-10-CM

## 2013-09-13 DIAGNOSIS — I10 Essential (primary) hypertension: Secondary | ICD-10-CM

## 2013-09-13 DIAGNOSIS — I679 Cerebrovascular disease, unspecified: Secondary | ICD-10-CM

## 2013-09-13 DIAGNOSIS — I4891 Unspecified atrial fibrillation: Secondary | ICD-10-CM

## 2013-09-13 DIAGNOSIS — I639 Cerebral infarction, unspecified: Secondary | ICD-10-CM

## 2013-09-13 LAB — POCT INR: INR: 1.2

## 2013-09-13 NOTE — Patient Instructions (Signed)
Your physician wants you to follow-up in: [place] will receive a reminder letter in the mail two months in advance. If you don't receive a letter, please call our office to schedule the follow-up appointment.   Your physician has requested that you have a carotid duplex. This test is an ultrasound of the carotid arteries in your neck. It looks at blood flow through these arteries that supply the brain with blood. Allow one hour for this exam. There are no restrictions or special instructions.DUE IN [place]

## 2013-09-13 NOTE — Assessment & Plan Note (Signed)
Continue statin. Not on aspirin given need for Coumadin. Followup carotid Dopplers November 2015.

## 2013-09-13 NOTE — Assessment & Plan Note (Signed)
Patient was found to have atrial fibrillation on her implantable loop. She remains in atrial fibrillation today. She has a previous CVA. She will therefore require lifelong Coumadin. Continue Toprol for rate control. She is not symptomatic from her atrial fibrillation.

## 2013-09-13 NOTE — Assessment & Plan Note (Signed)
Blood pressure controlled. Continue present medications. 

## 2013-09-13 NOTE — Assessment & Plan Note (Addendum)
Continue SBE prophylaxis. History a mitral valve valvuloplasty.

## 2013-09-13 NOTE — Progress Notes (Signed)
HPI: FU MS and atrial fibrillation. Patient has a history of rheumatic fever. Cardiac catheterization in [facility] in June of 2008 showed normal LV function, no coronary disease, moderate mitral stenosis with a valve area of 1.1 cm, and severe pulmonary hypertension. TEE in [facility] in 2008 showed normal LV function and moderate mitral stenosis with a valve area of 1.4 cm, mild MR. Patient had valvuloplasty at [facility] in 2008. Nuclear study in April of 2014 showed an ejection fraction of 56%. There was a fixed anterior defect consistent with soft tissue attenuation but no ischemia. Abdominal ultrasound in April of 2014 normal. Patient was admitted on November 28 with a CVA. 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 Transesophageal echocardiogram on December 2 showed normal LV function. There was mild mitral stenosis and mild mitral regurgitation. The left atrium was dilated and there was spontaneous contrast in the left atrial appendage but no thrombus. There was mild spontaneous contrast in the right atrium. There was a small oscillating density on the anterior mitral valve leaflet of uncertain etiology and vegetation could not be excluded. There was a small oscillating density on the aortic valve felt most likely to be Lambl's excrescence. Patient was treated for SBE blood cultures negative. Patient did have an implantable loop placed prior to discharge. This ultimately revealed paroxysmal atrial fibrillation and anticoagulation was initiated. Echo repeated in February 2013 and showed normal LV function, mild mitral stenosis and mitral regurgitation, moderate biatrial enlargement and trace aortic insufficiency. Since last seen, the patient denies any dyspnea on exertion, orthopnea, PND, pedal edema, palpitations, syncope or chest pain.    Current Outpatient Prescriptions  Medication Sig Dispense Refill  . amLODipine (NORVASC) 10 MG tablet Take 1  tablet (10 mg total) by mouth daily.  30 tablet  0  . atorvastatin (LIPITOR) 80 MG tablet Take 1 tablet (80 mg total) by mouth daily at 6 PM.  30 tablet  0  . cholestyramine (QUESTRAN) 4 G packet Take 1 packet (4 g total) by mouth 3 (three) times daily with meals.  30 each  0  . metoprolol succinate (TOPROL-XL) 25 MG 24 hr tablet TAKE ONE TABLET BY MOUTH ONCE DAILY  30 tablet  3  . metroNIDAZOLE (FLAGYL) 500 MG tablet Take 1 tablet (500 mg total) by mouth 3 (three) times daily.  21 tablet  0  . potassium chloride (K-DUR) 10 MEQ tablet Take 2 tablets (20 mEq total) by mouth 2 (two) times daily.  20 tablet  0  . potassium chloride SA (K-DUR,KLOR-CON) 20 MEQ tablet Take 2 tablets (40 mEq total) by mouth 2 (two) times daily.  120 tablet  0  . warfarin (COUMADIN) 1 MG tablet Take as directed by anticoagulation clinic  70 tablet  3   No current facility-administered medications for this visit.     Past Medical History  Diagnosis Date  . Hypertension   . Stroke   . History of rheumatic fever     as a child  . Hyperlipidemia   . Mitral stenosis   . OSA (obstructive sleep apnea)     mild per sleep study 2008  . Heart murmur   . Complication of anesthesia     difficult to wake up from anesthesia  . Pneumonia     double pneumonia once  . Atrial fibrillation     Past Surgical History  Procedure Laterality Date  . Cardiac surgery  2008    "  balloon surgery at [facility]"  . Abdominal hysterectomy    . Colonoscopy w/ polypectomy    . Appendectomy  1989    appendix ruptured,had peritonitis  . Tee without cardioversion N/A 04/10/2013    Procedure: TRANSESOPHAGEAL ECHOCARDIOGRAM (TEE);  Surgeon: [name], MD;  Location: [facility] ENDOSCOPY;  Service: Cardiovascular;  Laterality: N/A;  . Loop recorder implant  04-10-2013    MDT LinQ implanted by Dr Rayann Heman for cryptogenic stroke    History   Social History  . Marital Status: Married    Spouse Name: N/A    Number of Children: 2  . Years of  Education: N/A   Occupational History  . Not on file.   Social History Main Topics  . Smoking status: Former Smoker -- 1.00 packs/day for 2 years    Types: Cigarettes    Quit date: 05/11/1975  . Smokeless tobacco: Not on file  . Alcohol Use: No     Comment: drank years ago when young  . Drug Use: No  . Sexual Activity: Not on file   Other Topics Concern  . 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 Remaining systems are negative.  Physical Exam: Well-developed well-nourished in no acute distress.  Skin is warm and dry.  HEENT is normal.  Neck is supple.  Chest is clear to auscultation with normal expansion.  Cardiovascular exam is irregular Abdominal exam nontender or distended. No masses palpated. Extremities show no edema. neuro grossly intact  ECG Atrial fibrillationAt a rate of 61. Low voltage.

## 2013-09-13 NOTE — Assessment & Plan Note (Signed)
Continue statin. 

## 2013-09-18 ENCOUNTER — Ambulatory Visit (INDEPENDENT_AMBULATORY_CARE_PROVIDER_SITE_OTHER): Payer: BC Managed Care – PPO | Admitting: *Deleted

## 2013-09-18 DIAGNOSIS — I639 Cerebral infarction, unspecified: Secondary | ICD-10-CM

## 2013-09-18 DIAGNOSIS — I4891 Unspecified atrial fibrillation: Secondary | ICD-10-CM

## 2013-09-18 DIAGNOSIS — Z5181 Encounter for therapeutic drug level monitoring: Secondary | ICD-10-CM

## 2013-09-18 DIAGNOSIS — I6350 Cerebral infarction due to unspecified occlusion or stenosis of unspecified cerebral artery: Secondary | ICD-10-CM

## 2013-09-18 LAB — POCT INR: INR: 1.6

## 2013-09-19 ENCOUNTER — Other Ambulatory Visit: Payer: Self-pay | Admitting: Family

## 2013-09-20 NOTE — Telephone Encounter (Signed)
Rx request to pharmacy/SLS  

## 2013-09-24 ENCOUNTER — Ambulatory Visit (INDEPENDENT_AMBULATORY_CARE_PROVIDER_SITE_OTHER): Payer: BC Managed Care – PPO | Admitting: Surgery

## 2013-09-24 DIAGNOSIS — I6350 Cerebral infarction due to unspecified occlusion or stenosis of unspecified cerebral artery: Secondary | ICD-10-CM

## 2013-09-24 DIAGNOSIS — I639 Cerebral infarction, unspecified: Secondary | ICD-10-CM

## 2013-09-24 DIAGNOSIS — Z5181 Encounter for therapeutic drug level monitoring: Secondary | ICD-10-CM

## 2013-09-24 DIAGNOSIS — I4891 Unspecified atrial fibrillation: Secondary | ICD-10-CM

## 2013-09-24 LAB — POCT INR: INR: 2.1

## 2013-09-24 MED ORDER — WARFARIN SODIUM 5 MG PO TABS: ORAL_TABLET | ORAL | Status: DC

## 2013-10-08 ENCOUNTER — Ambulatory Visit (INDEPENDENT_AMBULATORY_CARE_PROVIDER_SITE_OTHER): Payer: BC Managed Care – PPO | Admitting: *Deleted

## 2013-10-08 DIAGNOSIS — I639 Cerebral infarction, unspecified: Secondary | ICD-10-CM

## 2013-10-08 DIAGNOSIS — Z5181 Encounter for therapeutic drug level monitoring: Secondary | ICD-10-CM

## 2013-10-08 DIAGNOSIS — I4891 Unspecified atrial fibrillation: Secondary | ICD-10-CM

## 2013-10-08 DIAGNOSIS — I6350 Cerebral infarction due to unspecified occlusion or stenosis of unspecified cerebral artery: Secondary | ICD-10-CM

## 2013-10-08 LAB — POCT INR: INR: 2.6

## 2013-10-09 ENCOUNTER — Ambulatory Visit (INDEPENDENT_AMBULATORY_CARE_PROVIDER_SITE_OTHER): Payer: BC Managed Care – PPO | Admitting: Nurse Practitioner

## 2013-10-09 ENCOUNTER — Encounter: Payer: Self-pay | Admitting: Nurse Practitioner

## 2013-10-09 VITALS — BP 136/78 | HR 79 | Temp 98.7°F | Ht 63.0 in | Wt 158.0 lb

## 2013-10-09 DIAGNOSIS — I639 Cerebral infarction, unspecified: Secondary | ICD-10-CM

## 2013-10-09 DIAGNOSIS — I6350 Cerebral infarction due to unspecified occlusion or stenosis of unspecified cerebral artery: Secondary | ICD-10-CM

## 2013-10-09 NOTE — Progress Notes (Signed)
Loop recorder 

## 2013-10-09 NOTE — Patient Instructions (Signed)
Continue coumadin  for atrial fibrillation and secondary stroke prevention and maintain strict control of hypertension with blood pressure goal below 140/90, and lipids with LDL cholesterol goal below 100 mg/dL.  Followup in the future in 6 months, sooner as needed.  Stroke Prevention Some medical conditions and behaviors are associated with an increased chance of having a stroke. You may prevent a stroke by making healthy choices and managing medical conditions. HOW CAN I REDUCE MY RISK OF HAVING A STROKE?   Stay physically active. Get at least 30 minutes of activity on most or all days.  Do not smoke. It may also be helpful to avoid exposure to secondhand smoke.  Limit alcohol use. Moderate alcohol use is considered to be:  No more than 2 drinks per day for men.  No more than 1 drink per day for nonpregnant women.  Eat healthy foods. This involves  Eating 5 or more servings of fruits and vegetables a day.  Following a diet that addresses high blood pressure (hypertension), high cholesterol, diabetes, or obesity.  Manage your cholesterol levels.  A diet low in saturated fat, trans fat, and cholesterol and high in fiber may control cholesterol levels.  Take any prescribed medicines to control cholesterol as directed by your health care provider.  Manage your diabetes.  A controlled-carbohydrate, controlled-sugar diet is recommended to manage diabetes.  Take any prescribed medicines to control diabetes as directed by your health care provider.  Control your hypertension.  A low-salt (sodium), low-saturated fat, low-trans fat, and low-cholesterol diet is recommended to manage hypertension.  Take any prescribed medicines to control hypertension as directed by your health care provider.  Maintain a healthy weight.  A reduced-calorie, low-sodium, low-saturated fat, low-trans fat, low-cholesterol diet is recommended to manage weight.  Stop drug abuse.  Avoid taking birth  control pills.  Talk to your health care provider about the risks of taking birth control pills if you are over 75 years old, smoke, get migraines, or have ever had a blood clot.  Get evaluated for sleep disorders (sleep apnea).  Talk to your health care provider about getting a sleep evaluation if you snore a lot or have excessive sleepiness.  Take medicines as directed by your health care provider.  For some people, aspirin or blood thinners (anticoagulants) are helpful in reducing the risk of forming abnormal blood clots that can lead to stroke. If you have the irregular heart rhythm of atrial fibrillation, you should be on a blood thinner unless there is a good reason you cannot take them.  Understand all your medicine instructions.  Make sure that other other conditions (such as anemia or atherosclerosis) are addressed. SEEK IMMEDIATE MEDICAL CARE IF:   You have sudden weakness or numbness of the face, arm, or leg, especially on one side of the body.  Your face or eyelid droops to one side.  You have sudden confusion.  You have trouble speaking (aphasia) or understanding.  You have sudden trouble seeing in one or both eyes.  You have sudden trouble walking.  You have dizziness.  You have a loss of balance or coordination.  You have a sudden, severe headache with no known cause.  You have new chest pain or an irregular heartbeat. Any of these symptoms may represent a serious problem that is an emergency. Do not wait to see if the symptoms will go away. Get medical help at once. Call your local emergency services  (911 in [place] Do not drive yourself to the  hospital. Document Released: 06/03/2004 Document Revised: 02/14/2013 Document Reviewed: 10/27/2012 [facility] Patient Information 2014 [place].

## 2013-10-09 NOTE — Progress Notes (Signed)
PATIENT: Marie Malone DOB: 09/05/1950  REASON FOR VISIT: hospital follow up for stroke HISTORY FROM: patient  HISTORY OF PRESENT ILLNESS: Marie Malone is a 63 y.o. AA female with a history of stroke who got up to go to the bathroom 04/05/2013 and then subsequently fell. Since that time has noticed right-sided weakness. Family noticed slurred speech as well. MRI showed a moderate-sized acute posterior left MCA infarct.  MRA Brain showed an occluded left MCA posterior M2 branch about 10 mm beyond its origin.  Negative more proximal anterior circulation and otherwise negative intracranial MRA. Carotid Doppler showed 60-79% Right ICA stenosis. There was 1-39% left ICA stenosis.  Cardiology has plans to follow up with dopplers in November.  Patient did have an implantable loop placed prior to discharge. Followup interrogation showed atrial fibrillation and she was started on Coumadin.  She was also seen by infectious disease and it was felt she could have endocarditis based on TEE results and she was treated with several weeks of IV antibiotics. Her blood pressure has ben well controlled, it is 136/78 in the office today.  She was started on atorvastatin during hospitalization and she has tolerated it well, with good response.  REVIEW OF SYSTEMS: Full 14 system review of systems performed and notable only for:  No complaints.  ALLERGIES: No Known Allergies  HOME MEDICATIONS: Outpatient Prescriptions Prior to Visit  Medication Sig Dispense Refill  . amLODipine (NORVASC) 10 MG tablet TAKE ONE TABLET BY MOUTH ONCE DAILY  30 tablet  0  . atorvastatin (LIPITOR) 80 MG tablet Take 1 tablet (80 mg total) by mouth daily at 6 PM.  30 tablet  0  . metoprolol succinate (TOPROL-XL) 25 MG 24 hr tablet TAKE ONE TABLET BY MOUTH ONCE DAILY  30 tablet  3  . warfarin (COUMADIN) 5 MG tablet Take as directed by coumadin clinic  40 tablet  3  . potassium chloride SA (K-DUR,KLOR-CON) 20 MEQ tablet Take 2  tablets (40 mEq total) by mouth 2 (two) times daily.  120 tablet  0  . cholestyramine (QUESTRAN) 4 G packet Take 1 packet (4 g total) by mouth 3 (three) times daily with meals.  30 each  0  . metroNIDAZOLE (FLAGYL) 500 MG tablet Take 1 tablet (500 mg total) by mouth 3 (three) times daily.  21 tablet  0  . potassium chloride (K-DUR) 10 MEQ tablet Take 2 tablets (20 mEq total) by mouth 2 (two) times daily.  20 tablet  0   No facility-administered medications prior to visit.     PHYSICAL EXAM  Filed Vitals:   10/09/13 1524  BP: 136/78  Pulse: 79  Temp: 98.7 F (37.1 C)  TempSrc: Oral  Height: 5\' 3"  (1.6 m)  Weight: 158 lb (71.668 kg)   Body mass index is 28 kg/(m^2). No exam data present   Generalized: Well developed, in no acute distress  Head: normocephalic and atraumatic. Oropharynx benign  Neck: Supple, no carotid bruits  Cardiac: Irregular rate and rhythm, soft 2/6 systolic murmur  Musculoskeletal: No deformity   Neurological examination  Mentation: Alert oriented to time, place, history taking. Follows all commands speech and language fluent Cranial nerve II-XII: Pupils were equal round reactive to light extraocular movements were full, visual field were full on confrontational test. Facial symmetry is not present. Minimal depression of the right NL fold.  Sensation diminished on the right side of the face. Hearing was intact to finger rubbing bilaterally. Uvula tongue midline. head  turning and shoulder shrug and were normal and symmetric.Tongue protrusion into cheek strength was normal. Motor: The patient has good strength in all 4 extremities, with the exception that there is 4/5 grip strength of the right hand, 4+/5 strength proximately in the right arm, left extremities are of normal strength.  Sensory: Soft touch sensation is symmetric on the legs, slightly decreased on the right face and arm. astereognosia right hand and mild sensory ataxia RUE  Coordination: The patient  has good finger-nose-finger and heel-to-shin bilaterally, except for minimal ataxia of the right arm.  Gait and station: Gait is normal. Tandem gait is normal. Romberg is negative.  Reflexes: Deep tendon reflexes are symmetric, but are depressed.    NIHSS: 2 mRs: 1  ASSESSMENT AND PLAN  63 y.o. year old female  has a past medical history of Hypertension; Stroke; History of rheumatic fever; Hyperlipidemia; Mitral stenosis; OSA (obstructive sleep apnea); Heart murmur; Complication of anesthesia; Pneumonia; and Atrial fibrillation. here with posterior left MCA infarct due to M2 branch occlusion on 11/15. Infarct felt to be embolic secondary to atrial fibrillation which was detected by loop recorder post-discharge.   The patient has a residual weakness mainly involving the right upper extremity, with sensory ataxia and stereognosia.   Vascular riak factors of Hypertension, History of rheumatic fever, atrial fibrillation, and Dyslipidemia.  PLAN: I had a long discussion with the patient and family regarding her recent strokes, discussed results of evaluation in the hospital and answered questions.  Continue coumadin  for atrial fibrillation and secondary stroke prevention and maintain strict control of hypertension with blood pressure goal below 140/90, and lipids with LDL cholesterol goal below 100 mg/dL.  Followup in the future in 6 months, sooner as needed.  Marie Pali, MSN, NP-C 10/09/2013, 3:34 PM [facility] [address], [place], [place] [address] [phone]  Note: This document was prepared with digital dictation and possible smart phrase technology. Any transcriptional errors that result from this process are unintentional.

## 2013-10-12 ENCOUNTER — Ambulatory Visit (INDEPENDENT_AMBULATORY_CARE_PROVIDER_SITE_OTHER): Payer: BC Managed Care – PPO | Admitting: *Deleted

## 2013-10-12 DIAGNOSIS — I639 Cerebral infarction, unspecified: Secondary | ICD-10-CM

## 2013-10-12 DIAGNOSIS — I6350 Cerebral infarction due to unspecified occlusion or stenosis of unspecified cerebral artery: Secondary | ICD-10-CM

## 2013-10-12 LAB — MDC_IDC_ENUM_SESS_TYPE_REMOTE
MDC IDC SESS DTM: 20150618232516
MDC IDC SET ZONE DETECTION INTERVAL: 360 ms
Zone Setting Detection Interval: 2000 ms
Zone Setting Detection Interval: 3000 ms

## 2013-10-16 NOTE — Progress Notes (Signed)
I agree with above 

## 2013-10-22 ENCOUNTER — Other Ambulatory Visit: Payer: Self-pay | Admitting: Family

## 2013-10-22 ENCOUNTER — Ambulatory Visit (INDEPENDENT_AMBULATORY_CARE_PROVIDER_SITE_OTHER): Payer: BC Managed Care – PPO | Admitting: *Deleted

## 2013-10-22 DIAGNOSIS — I4891 Unspecified atrial fibrillation: Secondary | ICD-10-CM

## 2013-10-22 DIAGNOSIS — Z5181 Encounter for therapeutic drug level monitoring: Secondary | ICD-10-CM

## 2013-10-22 DIAGNOSIS — I6350 Cerebral infarction due to unspecified occlusion or stenosis of unspecified cerebral artery: Secondary | ICD-10-CM

## 2013-10-22 DIAGNOSIS — I639 Cerebral infarction, unspecified: Secondary | ICD-10-CM

## 2013-10-22 DIAGNOSIS — I10 Essential (primary) hypertension: Secondary | ICD-10-CM

## 2013-10-22 DIAGNOSIS — E876 Hypokalemia: Secondary | ICD-10-CM

## 2013-10-22 LAB — POCT INR: INR: 2.4

## 2013-10-22 NOTE — Progress Notes (Signed)
I agree with above 

## 2013-10-24 ENCOUNTER — Encounter: Payer: Self-pay | Admitting: Internal Medicine

## 2013-10-29 LAB — MDC_IDC_ENUM_SESS_TYPE_REMOTE

## 2013-11-13 ENCOUNTER — Ambulatory Visit (INDEPENDENT_AMBULATORY_CARE_PROVIDER_SITE_OTHER): Payer: BC Managed Care – PPO | Admitting: *Deleted

## 2013-11-13 DIAGNOSIS — I639 Cerebral infarction, unspecified: Secondary | ICD-10-CM

## 2013-11-13 DIAGNOSIS — I6350 Cerebral infarction due to unspecified occlusion or stenosis of unspecified cerebral artery: Secondary | ICD-10-CM

## 2013-11-13 LAB — MDC_IDC_ENUM_SESS_TYPE_REMOTE
MDC IDC SESS DTM: 20150729110220
MDC IDC SET ZONE DETECTION INTERVAL: 360 ms
Zone Setting Detection Interval: 2000 ms
Zone Setting Detection Interval: 3000 ms

## 2013-11-19 ENCOUNTER — Ambulatory Visit (INDEPENDENT_AMBULATORY_CARE_PROVIDER_SITE_OTHER): Payer: BC Managed Care – PPO | Admitting: *Deleted

## 2013-11-19 DIAGNOSIS — Z5181 Encounter for therapeutic drug level monitoring: Secondary | ICD-10-CM

## 2013-11-19 DIAGNOSIS — I4891 Unspecified atrial fibrillation: Secondary | ICD-10-CM

## 2013-11-19 LAB — POCT INR: INR: 2.7

## 2013-11-20 ENCOUNTER — Other Ambulatory Visit: Payer: Self-pay | Admitting: Family

## 2013-11-20 ENCOUNTER — Other Ambulatory Visit: Payer: Self-pay | Admitting: Physician Assistant

## 2013-11-20 DIAGNOSIS — E876 Hypokalemia: Secondary | ICD-10-CM

## 2013-11-20 DIAGNOSIS — I10 Essential (primary) hypertension: Secondary | ICD-10-CM

## 2013-11-20 MED ORDER — AMLODIPINE BESYLATE 10 MG PO TABS: ORAL_TABLET | ORAL | Status: DC

## 2013-11-20 MED ORDER — POTASSIUM CHLORIDE CRYS ER 20 MEQ PO TBCR: 40.0000 meq | EXTENDED_RELEASE_TABLET | Freq: Every day | ORAL | Status: DC

## 2013-11-20 NOTE — Telephone Encounter (Signed)
2 week supply sent for amlodipine, atorvastatin and potassium as pt is past due for follow up with Melissa. Please call pt to arrange appt before further refills are needed.

## 2013-11-22 NOTE — Telephone Encounter (Signed)
Left detailed message informing patient of medication refill and that she needs to be seen before further refills can be given.

## 2013-11-22 NOTE — Telephone Encounter (Signed)
Appointment scheduled for 12/03/13

## 2013-11-23 ENCOUNTER — Encounter: Payer: Self-pay | Admitting: Internal Medicine

## 2013-11-26 NOTE — Progress Notes (Signed)
Loop recorder 

## 2013-12-03 ENCOUNTER — Encounter: Payer: Self-pay | Admitting: Family

## 2013-12-03 ENCOUNTER — Ambulatory Visit (INDEPENDENT_AMBULATORY_CARE_PROVIDER_SITE_OTHER): Payer: BC Managed Care – PPO | Admitting: Family

## 2013-12-03 VITALS — BP 124/86 | HR 71 | Temp 98.8°F | Resp 16 | Ht 63.0 in | Wt 163.0 lb

## 2013-12-03 DIAGNOSIS — A0472 Enterocolitis due to Clostridium difficile, not specified as recurrent: Secondary | ICD-10-CM

## 2013-12-03 DIAGNOSIS — I10 Essential (primary) hypertension: Secondary | ICD-10-CM

## 2013-12-03 DIAGNOSIS — E785 Hyperlipidemia, unspecified: Secondary | ICD-10-CM

## 2013-12-03 DIAGNOSIS — E876 Hypokalemia: Secondary | ICD-10-CM

## 2013-12-03 DIAGNOSIS — R3915 Urgency of urination: Secondary | ICD-10-CM | POA: Insufficient documentation

## 2013-12-03 DIAGNOSIS — R350 Frequency of micturition: Secondary | ICD-10-CM

## 2013-12-03 MED ORDER — METOPROLOL SUCCINATE ER 25 MG PO TB24: ORAL_TABLET | ORAL | Status: DC

## 2013-12-03 MED ORDER — POTASSIUM CHLORIDE CRYS ER 20 MEQ PO TBCR: 20.0000 meq | EXTENDED_RELEASE_TABLET | Freq: Every day | ORAL | Status: DC

## 2013-12-03 MED ORDER — AMLODIPINE BESYLATE 10 MG PO TABS: ORAL_TABLET | ORAL | Status: DC

## 2013-12-03 MED ORDER — ATORVASTATIN CALCIUM 80 MG PO TABS: ORAL_TABLET | ORAL | Status: DC

## 2013-12-03 NOTE — Assessment & Plan Note (Signed)
Taking atorvastatin. Reports no myalgias. Obtain LFTs today.

## 2013-12-03 NOTE — Assessment & Plan Note (Addendum)
Stable on amlodipine and metoprolol. Follow up in three months. BP Readings from Last 3 Encounters:  12/03/13 124/86  10/09/13 136/78  09/13/13 116/70

## 2013-12-03 NOTE — Assessment & Plan Note (Signed)
Taking one chlor-con daily. Obtain BMET to monitor.

## 2013-12-03 NOTE — Patient Instructions (Signed)
Please complete lab work prior to leaving. Follow up in 3 months.  

## 2013-12-03 NOTE — Progress Notes (Signed)
Subjective:    Patient ID: Marie Malone, female    DOB: November 09, 1950, 63 y.o.   MRN: [id number]  HPI Marie Malone is a 63 year old female here today for follow up.  HTN: Denies chest pain, SOB, cough. She has some edema in feet bilaterally left worse than right. Reports taking her potassium. BP Readings from Last 3 Encounters:  12/03/13 124/86  10/09/13 136/78  09/13/13 116/70   A fib: follows with [name]. Last saw him in May 2015. She will go back in November and will have study on carotids.  She reports urgency when she urinates. When she urinates, she feels like she needs to have a bowel movement also.  Review of Systems  Constitutional: Negative for fever, chills and diaphoresis.  Cardiovascular: Negative for chest pain and palpitations.  Gastrointestinal: Negative for diarrhea and blood in stool.  Genitourinary: Negative for hematuria.  Musculoskeletal: Negative for myalgias.   Past Medical History  Diagnosis Date  . Hypertension   . Stroke   . History of rheumatic fever     as a child  . Hyperlipidemia   . Mitral stenosis   . OSA (obstructive sleep apnea)     mild per sleep study 2008  . Heart murmur   . Complication of anesthesia     difficult to wake up from anesthesia  . Pneumonia     double pneumonia once  . Atrial fibrillation     History   Social History  . Marital Status: Married    Spouse Name: N/A    Number of Children: 2  . Years of Education: N/A   Occupational History  . Not on file.   Social History Main Topics  . Smoking status: Former Smoker -- 1.00 packs/day for 2 years    Types: Cigarettes    Quit date: 05/11/1975  . Smokeless tobacco: Not on file  . Alcohol Use: No     Comment: drank years ago when young  . Drug Use: No  . Sexual Activity: Not on file   Other Topics Concern  . Not on file   Social History Narrative   Retired Archivist   Married   She has 2 grown children-   Daughter lives with her   Son lives in [place]   6 grandchildren    Past Surgical History  Procedure Laterality Date  . Cardiac surgery  2008    "balloon surgery at [facility]"  . Abdominal hysterectomy    . Colonoscopy w/ polypectomy    . Appendectomy  1989    appendix ruptured,had peritonitis  . Tee without cardioversion N/A 04/10/2013    Procedure: TRANSESOPHAGEAL ECHOCARDIOGRAM (TEE);  Surgeon: [name], MD;  Location: [facility] ENDOSCOPY;  Service: Cardiovascular;  Laterality: N/A;  . Loop recorder implant  04-10-2013    MDT LinQ implanted by Dr Rayann Heman for cryptogenic stroke    Family History  Problem Relation Age of Onset  . Diabetes Mother   . Stroke Mother   . Diabetes Father   . Heart disease Father     CHF  . Cancer Father     kidney  . Diabetes Sister   . Diabetes Brother   . Hypertension Daughter     No Known Allergies  Current Outpatient Prescriptions on File Prior to Visit  Medication Sig Dispense Refill  . warfarin (COUMADIN) 5 MG tablet Take as directed by coumadin clinic  40 tablet  3   No current facility-administered medications  on file prior to visit.    BP 124/86  Pulse 71  Temp(Src) 98.8 F (37.1 C) (Oral)  Resp 16  Ht 5\' 3"  (1.6 m)  Wt 163 lb (73.936 kg)  BMI 28.88 kg/m2  SpO2 99%       Objective:   Physical Exam  Constitutional: She is oriented to person, place, and time. She appears well-developed and well-nourished. No distress.  HENT:  Head: Normocephalic and atraumatic.  Mouth/Throat: No oropharyngeal exudate.  Eyes: Pupils are equal, round, and reactive to light. No scleral icterus.  Cardiovascular: Normal rate and intact distal pulses.  An irregular rhythm present. Exam reveals no gallop and no friction rub.   No murmur heard. Pulmonary/Chest: Effort normal. No respiratory distress. She has no wheezes. She has no rales.  Abdominal: Soft. Bowel sounds are normal. She exhibits no distension and no mass. There is no tenderness. There is no rebound and no  guarding.  Musculoskeletal:  Slight edema in feet bilaterally, left > right.  Lymphadenopathy:    She has no cervical adenopathy.  Neurological: She is alert and oriented to person, place, and time.  Skin: Skin is warm and dry. She is not diaphoretic.  Psychiatric: She has a normal mood and affect. Her behavior is normal. Judgment and thought content normal.          Assessment & Plan:  Patient seen by [facility] NP-student.  I have personally seen and examined patient and agree with Ms. Whitmire's assessment and plan.

## 2013-12-03 NOTE — Progress Notes (Signed)
Pre visit review using our clinic review tool, if applicable. No additional management support is needed unless otherwise documented below in the visit note. 

## 2013-12-03 NOTE — Assessment & Plan Note (Signed)
She reports diarrhea is resolved.

## 2013-12-04 LAB — HEPATIC FUNCTION PANEL
ALT: 33 U/L (ref 0–35)
AST: 35 U/L (ref 0–37)
Albumin: 4 g/dL (ref 3.5–5.2)
Alkaline Phosphatase: 93 U/L (ref 39–117)
Bilirubin, Direct: 0.2 mg/dL (ref 0.0–0.3)
Indirect Bilirubin: 0.8 mg/dL (ref 0.2–1.2)
Total Bilirubin: 1 mg/dL (ref 0.2–1.2)
Total Protein: 8.4 g/dL — ABNORMAL HIGH (ref 6.0–8.3)

## 2013-12-04 LAB — BASIC METABOLIC PANEL
BUN: 19 mg/dL (ref 6–23)
CALCIUM: 8.9 mg/dL (ref 8.4–10.5)
CO2: 23 mEq/L (ref 19–32)
Chloride: 105 mEq/L (ref 96–112)
Creat: 0.93 mg/dL (ref 0.50–1.10)
Glucose, Bld: 88 mg/dL (ref 70–99)
Potassium: 4.4 mEq/L (ref 3.5–5.3)
Sodium: 140 mEq/L (ref 135–145)

## 2013-12-04 LAB — URINALYSIS, ROUTINE W REFLEX MICROSCOPIC
Bilirubin Urine: NEGATIVE
Glucose, UA: NEGATIVE mg/dL
Hgb urine dipstick: NEGATIVE
Ketones, ur: NEGATIVE mg/dL
Leukocytes, UA: NEGATIVE
NITRITE: NEGATIVE
Protein, ur: NEGATIVE mg/dL
SPECIFIC GRAVITY, URINE: 1.019 (ref 1.005–1.030)
UROBILINOGEN UA: 1 mg/dL (ref 0.0–1.0)
pH: 5.5 (ref 5.0–8.0)

## 2013-12-04 LAB — URINE CULTURE
Colony Count: NO GROWTH
Organism ID, Bacteria: NO GROWTH

## 2013-12-06 ENCOUNTER — Telehealth: Payer: Self-pay | Admitting: Family

## 2013-12-06 DIAGNOSIS — R779 Abnormality of plasma protein, unspecified: Secondary | ICD-10-CM

## 2013-12-06 NOTE — Telephone Encounter (Signed)
Potassium looks good and urine culture is negative for infeciton.

## 2013-12-06 NOTE — Telephone Encounter (Signed)
Please contact pt and let her know that protein is mildly elevated.  I would like to her to return for additional labs.

## 2013-12-07 NOTE — Telephone Encounter (Signed)
Notified pt and she voices understanding. Lab orders signed.

## 2013-12-13 ENCOUNTER — Encounter: Payer: Self-pay | Admitting: Internal Medicine

## 2013-12-13 ENCOUNTER — Ambulatory Visit (INDEPENDENT_AMBULATORY_CARE_PROVIDER_SITE_OTHER): Payer: BC Managed Care – PPO | Admitting: *Deleted

## 2013-12-13 DIAGNOSIS — I6350 Cerebral infarction due to unspecified occlusion or stenosis of unspecified cerebral artery: Secondary | ICD-10-CM

## 2013-12-13 LAB — MDC_IDC_ENUM_SESS_TYPE_REMOTE: Date Time Interrogation Session: 20150807040500

## 2013-12-17 ENCOUNTER — Ambulatory Visit (INDEPENDENT_AMBULATORY_CARE_PROVIDER_SITE_OTHER): Payer: BC Managed Care – PPO | Admitting: *Deleted

## 2013-12-17 DIAGNOSIS — Z5181 Encounter for therapeutic drug level monitoring: Secondary | ICD-10-CM

## 2013-12-17 DIAGNOSIS — I4891 Unspecified atrial fibrillation: Secondary | ICD-10-CM

## 2013-12-17 LAB — POCT INR: INR: 2.8

## 2013-12-17 NOTE — Progress Notes (Signed)
Loop recorder 

## 2013-12-18 ENCOUNTER — Encounter: Payer: Self-pay | Admitting: Family

## 2013-12-18 LAB — BASIC METABOLIC PANEL WITH GFR
BUN: 24 mg/dL — ABNORMAL HIGH (ref 6–23)
CHLORIDE: 108 meq/L (ref 96–112)
CO2: 25 meq/L (ref 19–32)
Calcium: 9 mg/dL (ref 8.4–10.5)
Creat: 0.96 mg/dL (ref 0.50–1.10)
GFR, Est African American: 74 mL/min
GFR, Est Non African American: 64 mL/min
Glucose, Bld: 76 mg/dL (ref 70–99)
Potassium: 3.7 mEq/L (ref 3.5–5.3)
SODIUM: 139 meq/L (ref 135–145)

## 2013-12-18 LAB — HEMOGLOBIN A1C
Hgb A1c MFr Bld: 5.8 % — ABNORMAL HIGH (ref <5.7)
MEAN PLASMA GLUCOSE: 120 mg/dL — AB (ref <117)

## 2013-12-19 LAB — PROTEIN ELECTROPHORESIS, SERUM
ALPHA-1-GLOBULIN: 3.5 % (ref 2.9–4.9)
ALPHA-2-GLOBULIN: 8.3 % (ref 7.1–11.8)
Albumin ELP: 48.5 % — ABNORMAL LOW (ref 55.8–66.1)
BETA 2: 8 % — AB (ref 3.2–6.5)
Beta Globulin: 6.9 % (ref 4.7–7.2)
GAMMA GLOBULIN: 24.8 % — AB (ref 11.1–18.8)
Total Protein, Serum Electrophoresis: 8.5 g/dL — ABNORMAL HIGH (ref 6.0–8.3)

## 2013-12-19 LAB — PROTEIN ELECTROPHORESIS, URINE REFLEX
Total Protein, Urine/Day: 43 mg/d — ABNORMAL LOW (ref 50–100)
Total Protein, Urine: 5 mg/dL

## 2013-12-20 ENCOUNTER — Telehealth: Payer: Self-pay | Admitting: Family

## 2013-12-20 DIAGNOSIS — R778 Other specified abnormalities of plasma proteins: Secondary | ICD-10-CM

## 2013-12-20 NOTE — Telephone Encounter (Signed)
Please contact pt and let her know that I reviewed the lab work that she complete and it shows some elevations of protein in her blood.  I would like to have her meet with hematology for further evaluation.

## 2013-12-21 NOTE — Telephone Encounter (Signed)
Notified pt and she voices understanding and is agreeable to proceed with referral. 

## 2013-12-25 ENCOUNTER — Encounter: Payer: Self-pay | Admitting: Internal Medicine

## 2013-12-26 ENCOUNTER — Encounter: Payer: Self-pay | Admitting: Internal Medicine

## 2014-01-11 ENCOUNTER — Ambulatory Visit (INDEPENDENT_AMBULATORY_CARE_PROVIDER_SITE_OTHER): Payer: BC Managed Care – PPO | Admitting: *Deleted

## 2014-01-11 DIAGNOSIS — I6350 Cerebral infarction due to unspecified occlusion or stenosis of unspecified cerebral artery: Secondary | ICD-10-CM

## 2014-01-11 DIAGNOSIS — I639 Cerebral infarction, unspecified: Secondary | ICD-10-CM

## 2014-01-11 LAB — MDC_IDC_ENUM_SESS_TYPE_REMOTE: Date Time Interrogation Session: 20150808040500

## 2014-01-18 NOTE — Progress Notes (Signed)
Loop recorder 

## 2014-01-21 ENCOUNTER — Ambulatory Visit (INDEPENDENT_AMBULATORY_CARE_PROVIDER_SITE_OTHER): Payer: BC Managed Care – PPO | Admitting: *Deleted

## 2014-01-21 DIAGNOSIS — I4891 Unspecified atrial fibrillation: Secondary | ICD-10-CM

## 2014-01-21 DIAGNOSIS — Z5181 Encounter for therapeutic drug level monitoring: Secondary | ICD-10-CM

## 2014-01-21 LAB — POCT INR: INR: 2.6

## 2014-01-23 ENCOUNTER — Encounter: Payer: Self-pay | Admitting: Internal Medicine

## 2014-01-30 ENCOUNTER — Encounter: Payer: Self-pay | Admitting: Internal Medicine

## 2014-02-11 ENCOUNTER — Ambulatory Visit (INDEPENDENT_AMBULATORY_CARE_PROVIDER_SITE_OTHER): Payer: BC Managed Care – PPO | Admitting: *Deleted

## 2014-02-11 DIAGNOSIS — I639 Cerebral infarction, unspecified: Secondary | ICD-10-CM

## 2014-02-11 LAB — MDC_IDC_ENUM_SESS_TYPE_REMOTE

## 2014-02-12 ENCOUNTER — Telehealth: Payer: Self-pay | Admitting: Hematology & Oncology

## 2014-02-12 NOTE — Telephone Encounter (Signed)
I spoke w NEW PATIENT today to remind them of their appointment with Dr. Ennever. Also, advised them to bring all medication bottles and insurance card information. ° °

## 2014-02-13 ENCOUNTER — Ambulatory Visit (HOSPITAL_BASED_OUTPATIENT_CLINIC_OR_DEPARTMENT_OTHER): Payer: BC Managed Care – PPO | Admitting: Lab

## 2014-02-13 ENCOUNTER — Encounter: Payer: Self-pay | Admitting: Hematology & Oncology

## 2014-02-13 ENCOUNTER — Ambulatory Visit: Payer: BC Managed Care – PPO

## 2014-02-13 ENCOUNTER — Ambulatory Visit (HOSPITAL_BASED_OUTPATIENT_CLINIC_OR_DEPARTMENT_OTHER): Payer: BC Managed Care – PPO | Admitting: Hematology & Oncology

## 2014-02-13 ENCOUNTER — Ambulatory Visit (HOSPITAL_BASED_OUTPATIENT_CLINIC_OR_DEPARTMENT_OTHER): Payer: BC Managed Care – PPO

## 2014-02-13 VITALS — BP 141/64 | HR 73 | Temp 98.2°F | Resp 14 | Ht 62.0 in | Wt 171.0 lb

## 2014-02-13 DIAGNOSIS — I10 Essential (primary) hypertension: Secondary | ICD-10-CM

## 2014-02-13 DIAGNOSIS — D472 Monoclonal gammopathy: Secondary | ICD-10-CM

## 2014-02-13 DIAGNOSIS — D72819 Decreased white blood cell count, unspecified: Secondary | ICD-10-CM

## 2014-02-13 DIAGNOSIS — Z23 Encounter for immunization: Secondary | ICD-10-CM

## 2014-02-13 LAB — CBC WITH DIFFERENTIAL (CANCER CENTER ONLY)
BASO#: 0 10*3/uL (ref 0.0–0.2)
BASO%: 0.3 % (ref 0.0–2.0)
EOS%: 3.1 % (ref 0.0–7.0)
Eosinophils Absolute: 0.1 10*3/uL (ref 0.0–0.5)
HCT: 37.9 % (ref 34.8–46.6)
HGB: 12.2 g/dL (ref 11.6–15.9)
LYMPH#: 1.1 10*3/uL (ref 0.9–3.3)
LYMPH%: 29.5 % (ref 14.0–48.0)
MCH: 27.9 pg (ref 26.0–34.0)
MCHC: 32.2 g/dL (ref 32.0–36.0)
MCV: 87 fL (ref 81–101)
MONO#: 0.5 10*3/uL (ref 0.1–0.9)
MONO%: 14.2 % — ABNORMAL HIGH (ref 0.0–13.0)
NEUT%: 52.9 % (ref 39.6–80.0)
NEUTROS ABS: 1.9 10*3/uL (ref 1.5–6.5)
Platelets: 157 10*3/uL (ref 145–400)
RBC: 4.37 10*6/uL (ref 3.70–5.32)
RDW: 14.6 % (ref 11.1–15.7)
WBC: 3.6 10*3/uL — AB (ref 3.9–10.0)

## 2014-02-13 LAB — CHCC SATELLITE - SMEAR

## 2014-02-13 MED ORDER — INFLUENZA VAC SPLIT QUAD 0.5 ML IM SUSY
0.5000 mL | PREFILLED_SYRINGE | Freq: Once | INTRAMUSCULAR | Status: AC
  Administered 2014-02-13: 0.5 mL via INTRAMUSCULAR
  Filled 2014-02-13: qty 0.5

## 2014-02-13 NOTE — Progress Notes (Signed)
Referral MD  Reason for Referral: Elevated serum protein   Chief Complaint  Patient presents with  . NEW PATIENT  : Not sure why I am here  HPI: Marie Malone is a very nice 63 year old Afro-American female. She has had a past history of stroke. His family have been because of endocarditis. He did have atrial fibrillation. 10 mitral stenosis. She had cardiac surgery at [facility].  She had a stroke in the last year. She has some right-sided affected weakness. However, this appears to be getting better.  She has some routine lab work done. This apparently showed she had an elevated total protein. A person with her pheresis was done. She did not have a monoclonal spike. She had an elevated gamma globulin. She had a urine electropheresis. I don't think this was a 24-hour urine. Again, there is no Bence-Jones protein noted.  She was referred to the [facility] because of concern about some underlying plasma cell disorder.  Her last lab work that showed is a minimal anemia back in March. She's not leukopenia or thrombocytopenia. Her total protein back in July was 8.4. Her albumin was 4.0.  She's had no weight loss or weight gain. She's had no headache. She's had no bleeding. She is on blood thinner. She's had no rashes. She's had no cough. There's been no change in bowel or bladder habits. She says she's never had a mammogram. She's never had a colonoscopy. She needs both of these.  She comes in with her daughter. Her daughter says that she's been doing fairly well.  Again, she is eating okay. There is no nausea or vomiting. She's had no issues smoking. She's had no alcohol use.  Overall, her performance status is ECOG 1.        Past Medical History  Diagnosis Date  . Hypertension   . Stroke   . History of rheumatic fever     as a child  . Hyperlipidemia   . Mitral stenosis   . OSA (obstructive sleep apnea)     mild per sleep study 2008  . Heart murmur   . Complication of anesthesia      difficult to wake up from anesthesia  . Pneumonia     double pneumonia once  . Atrial fibrillation   :  Past Surgical History  Procedure Laterality Date  . Cardiac surgery  2008    "balloon surgery at [facility]"  . Abdominal hysterectomy    . Colonoscopy w/ polypectomy    . Appendectomy  1989    appendix ruptured,had peritonitis  . Tee without cardioversion N/A 04/10/2013    Procedure: TRANSESOPHAGEAL ECHOCARDIOGRAM (TEE);  Surgeon: [name], MD;  Location: [facility] ENDOSCOPY;  Service: Cardiovascular;  Laterality: N/A;  . Loop recorder implant  04-10-2013    MDT LinQ implanted by Dr Rayann Heman for cryptogenic stroke  :  Current outpatient prescriptions:acetaminophen (TYLENOL) 325 MG tablet, Take 650 mg by mouth as needed., Disp: , Rfl: ;  amLODipine (NORVASC) 10 MG tablet, TAKE ONE TABLET BY MOUTH ONCE DAILY, Disp: 30 tablet, Rfl: 3;  atorvastatin (LIPITOR) 80 MG tablet, TAKE ONE TABLET BY MOUTH ONCE DAILY AT 6 PM, Disp: 30 tablet, Rfl: 3;  diphenhydrAMINE (BENADRYL) 25 mg capsule, Take 25 mg by mouth as needed., Disp: , Rfl:  metoprolol succinate (TOPROL-XL) 25 MG 24 hr tablet, TAKE ONE TABLET BY MOUTH ONCE DAILY, Disp: 30 tablet, Rfl: 3;  potassium chloride SA (K-DUR,KLOR-CON) 20 MEQ tablet, Take 1 tablet (20 mEq total) by  mouth daily., Disp: 30 tablet, Rfl: 3;  warfarin (COUMADIN) 5 MG tablet, Take as directed by coumadin clinic, Disp: 40 tablet, Rfl: 3:  :  No Known Allergies:  Family History  Problem Relation Age of Onset  . Diabetes Mother   . Stroke Mother   . Diabetes Father   . Heart disease Father     CHF  . Cancer Father     kidney  . Diabetes Sister   . Diabetes Brother   . Hypertension Daughter   :  History   Social History  . Marital Status: Married    Spouse Name: N/A    Number of Children: 2  . Years of Education: N/A   Occupational History  . Not on file.   Social History Main Topics  . Smoking status: Former Smoker -- 1.00 packs/day for 5 years     Types: Cigarettes    Start date: 07/15/1970    Quit date: 05/11/1975  . Smokeless tobacco: Never Used     Comment: quit smoking 36 years ago  . Alcohol Use: No     Comment: drank years ago when young  . Drug Use: No  . Sexual Activity: Not on file   Other Topics Concern  . Not on file   Social History Narrative   Retired Archivist   Married   She has 2 grown children-   Daughter lives with her   Son lives in [place]   6 grandchildren  :  Pertinent items are noted in HPI.  Exam: @IPVITALS @  well-developed and well-nourished Afro-American female in no obvious distress. Her vital signs show a temperature of 98.2. Pulse 73. Blood pressure 141/64. Weight is 171 pounds. Head and neck exam shows no ocular or oral lesions. There are no palpable cervical or supraclavicular lymph nodes. Lungs are clear. Cardiac exam regular in rhythm with no murmurs, rubs or bruits. Abdomen soft. Has good bowel sounds. There is no fluid wave. There is no palpable liver or spleen tip. Exam shows no tenderness over the spine, ribs or hips. Extremities shows no clubbing, cyanosis or edema. She has a slight weakness in lower right side. There may be some slight muscle atrophy on the right side. Neurological exam shows no focal neurological deficits.    Recent Labs  02/13/14 1331  WBC 3.6*  HGB 12.2  HCT 37.9  PLT 157   No results found for this basename: NA, K, CL, CO2, GLUCOSE, BUN, CREATININE, CALCIUM,  in the last 72 hours  Blood smear review: Normochromic and normocytic oblational red blood cells. She is no rouleau formation. There is no teardrop cells. There is no nucleated red cells. There is no target cells. There is no she sites or spherocytes. White cells are normal in morphology maturation. There is no immature myeloid or lymphoid forms. Platelets appear adequate in number and size.  Pathology: None     Assessment and Plan: 63 year old Afro-American female. She has minimal  leukopenia. I again, I don't think this is going to be a problem for her.  I do not see any evidence of a plasma cell disorder. She does not have a monoclonal spike on her protein or urine electrophoresis. Since there is no monoclonal spike, there is no reason to suspect any type of plasma cell dyscrasia.  She does not be a bone marrow biopsy. I would not do a 24-hour urine on her.  Her total serum protein was mildly elevated. Her albumin was not low.  Typically, with myeloma or similar problems, one would see a low albumin.  She came to daughter. I spent a good 45 minutes with him. I gave her a prayer blanket which she was very appreciative of.  I explained lab work. I find her why I did not think that she had a problem.  She left quite happy.  We do not have to see her back. She really needs a mammogram and colonoscopy. Her daughter understands this and will see that Dr. Inda Castle will arrange this.

## 2014-02-15 LAB — PROTEIN ELECTROPHORESIS, SERUM, WITH REFLEX
Albumin ELP: 46.7 % — ABNORMAL LOW (ref 55.8–66.1)
Alpha-1-Globulin: 3.3 % (ref 2.9–4.9)
Alpha-2-Globulin: 8.4 % (ref 7.1–11.8)
BETA GLOBULIN: 6.4 % (ref 4.7–7.2)
Beta 2: 9.4 % — ABNORMAL HIGH (ref 3.2–6.5)
Gamma Globulin: 25.8 % — ABNORMAL HIGH (ref 11.1–18.8)
Total Protein, Serum Electrophoresis: 8.6 g/dL — ABNORMAL HIGH (ref 6.0–8.3)

## 2014-02-15 LAB — COMPREHENSIVE METABOLIC PANEL
ALBUMIN: 4 g/dL (ref 3.5–5.2)
ALK PHOS: 87 U/L (ref 39–117)
ALT: 24 U/L (ref 0–35)
AST: 29 U/L (ref 0–37)
BILIRUBIN TOTAL: 0.6 mg/dL (ref 0.2–1.2)
BUN: 26 mg/dL — AB (ref 6–23)
CO2: 21 meq/L (ref 19–32)
CREATININE: 0.94 mg/dL (ref 0.50–1.10)
Calcium: 9.3 mg/dL (ref 8.4–10.5)
Chloride: 107 mEq/L (ref 96–112)
GLUCOSE: 89 mg/dL (ref 70–99)
Potassium: 3.8 mEq/L (ref 3.5–5.3)
SODIUM: 139 meq/L (ref 135–145)
Total Protein: 8.6 g/dL — ABNORMAL HIGH (ref 6.0–8.3)

## 2014-02-15 LAB — KAPPA/LAMBDA LIGHT CHAINS
Kappa free light chain: 7.18 mg/dL — ABNORMAL HIGH (ref 0.33–1.94)
Kappa:Lambda Ratio: 1.37 (ref 0.26–1.65)
LAMBDA FREE LGHT CHN: 5.26 mg/dL — AB (ref 0.57–2.63)

## 2014-02-15 LAB — IGG, IGA, IGM
IGA: 980 mg/dL — AB (ref 69–380)
IGG (IMMUNOGLOBIN G), SERUM: 2110 mg/dL — AB (ref 690–1700)
IgM, Serum: 114 mg/dL (ref 52–322)

## 2014-02-15 LAB — BETA 2 MICROGLOBULIN, SERUM: BETA 2 MICROGLOBULIN: 5.19 mg/L — AB (ref <=2.51)

## 2014-02-15 LAB — IFE INTERPRETATION

## 2014-02-15 NOTE — Progress Notes (Signed)
Loop recorder 

## 2014-02-26 ENCOUNTER — Other Ambulatory Visit: Payer: Self-pay | Admitting: Internal Medicine

## 2014-02-26 ENCOUNTER — Encounter: Payer: Self-pay | Admitting: Internal Medicine

## 2014-03-04 ENCOUNTER — Ambulatory Visit (INDEPENDENT_AMBULATORY_CARE_PROVIDER_SITE_OTHER): Payer: BC Managed Care – PPO | Admitting: *Deleted

## 2014-03-04 DIAGNOSIS — I4891 Unspecified atrial fibrillation: Secondary | ICD-10-CM

## 2014-03-04 DIAGNOSIS — Z5181 Encounter for therapeutic drug level monitoring: Secondary | ICD-10-CM

## 2014-03-04 LAB — POCT INR: INR: 2.3

## 2014-03-13 ENCOUNTER — Ambulatory Visit (INDEPENDENT_AMBULATORY_CARE_PROVIDER_SITE_OTHER): Payer: BC Managed Care – PPO | Admitting: *Deleted

## 2014-03-13 DIAGNOSIS — I639 Cerebral infarction, unspecified: Secondary | ICD-10-CM

## 2014-03-14 LAB — MDC_IDC_ENUM_SESS_TYPE_REMOTE

## 2014-03-19 NOTE — Progress Notes (Signed)
Loop recorder 

## 2014-03-27 ENCOUNTER — Encounter: Payer: Self-pay | Admitting: Internal Medicine

## 2014-04-09 ENCOUNTER — Ambulatory Visit (INDEPENDENT_AMBULATORY_CARE_PROVIDER_SITE_OTHER): Payer: BC Managed Care – PPO | Admitting: Neurology

## 2014-04-09 ENCOUNTER — Encounter: Payer: Self-pay | Admitting: Neurology

## 2014-04-09 VITALS — BP 127/64 | HR 71 | Ht 62.0 in | Wt 178.8 lb

## 2014-04-09 DIAGNOSIS — I63412 Cerebral infarction due to embolism of left middle cerebral artery: Secondary | ICD-10-CM

## 2014-04-09 DIAGNOSIS — E785 Hyperlipidemia, unspecified: Secondary | ICD-10-CM

## 2014-04-09 DIAGNOSIS — I6529 Occlusion and stenosis of unspecified carotid artery: Secondary | ICD-10-CM | POA: Insufficient documentation

## 2014-04-09 DIAGNOSIS — I6521 Occlusion and stenosis of right carotid artery: Secondary | ICD-10-CM

## 2014-04-09 DIAGNOSIS — I10 Essential (primary) hypertension: Secondary | ICD-10-CM

## 2014-04-09 DIAGNOSIS — I330 Acute and subacute infective endocarditis: Secondary | ICD-10-CM

## 2014-04-09 NOTE — Patient Instructions (Signed)
-   continue coumadin and lipitor for stroke prevention - follow up with PCP for INR monitoring, INR goal 2-3 - check BP at home - continue to follow up with your cardiologist - Follow up with your primary care physician for stroke risk factor modification. Recommend maintain blood pressure goal <130/80, diabetes with hemoglobin A1c goal below 6.5% and lipids with LDL cholesterol goal below 70 mg/dL.  - will check carotid doppler for monitoring right ICA stenosis - follow up in 6 months.

## 2014-04-09 NOTE — Progress Notes (Signed)
PATIENT: Marie Malone DOB: 04-12-1951  REASON FOR VISIT: hospital follow up for stroke HISTORY FROM: patient  HISTORY OF PRESENT ILLNESS: Marie Malone is a 63 y.o. AA female with a history of HTN who was admitted to [facility] on 04/05/2013 due to right-sided weakness and slurred speech. MRI showed a moderate-sized acute posterior left MCA infarct.  MRA Brain showed an occluded left MCA posterior M2 branch about 10 mm beyond its origin. Carotid Doppler showed 60-79% Right ICA stenosis. There was 1-39% left ICA stenosis. Patient did have an implantable loop placed prior to discharge. Shortly after placement, she was found to have atrial fibrillation and she was started on Coumadin. However, her TEE on 04/10/13 did show a small oscillating density on anterior MV leaflet ofuncertain etiology (cannot R/O vegetation) and a small oscillation density on aortic valve most likey Lambl's excresence. ID involved and blood culture negative but she got PICC line and finished off IV antibiotics for 6 weeks. She was also started on atorvastatin during hospitalization and she has tolerated it well with good response.   She was last seen by LL in 10/2013 and she was doing well on coumadin and lipitor. BP well controlled.  Interval history: During the interval time, she was doing well without acute issues. She continued on coumadin without problems. INR last check 2.3. Her BP well controlled at home, today is 127/64. She stated that she is compliant with medications.  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  REVIEW OF SYSTEMS: Full 14 system review of systems performed and notable only for:  No complaints.  ALLERGIES: No Known Allergies  HOME MEDICATIONS: Outpatient Prescriptions Prior to Visit  Medication Sig Dispense Refill  . acetaminophen (TYLENOL) 325 MG tablet Take 650 mg by mouth as needed.    [name] Kitchen amLODipine  (NORVASC) 10 MG tablet TAKE ONE TABLET BY MOUTH ONCE DAILY 30 tablet 3  . atorvastatin (LIPITOR) 80 MG tablet TAKE ONE TABLET BY MOUTH ONCE DAILY AT 6 PM 30 tablet 3  . diphenhydrAMINE (BENADRYL) 25 mg capsule Take 25 mg by mouth as needed.    . metoprolol succinate (TOPROL-XL) 25 MG 24 hr tablet TAKE ONE TABLET BY MOUTH ONCE DAILY 30 tablet 3  . potassium chloride SA (K-DUR,KLOR-CON) 20 MEQ tablet Take 1 tablet (20 mEq total) by mouth daily. 30 tablet 3  . warfarin (COUMADIN) 5 MG tablet TAKE AS DIRECTED BY COUMADIN CLINIC 40 tablet 3   No facility-administered medications prior to visit.     PHYSICAL EXAM  Filed Vitals:   04/09/14 1341  BP: 127/64  Pulse: 71  Height: 5\' 2"  (1.575 m)  Weight: 178 lb 12.8 oz (81.103 kg)   Body mass index is 32.69 kg/(m^2). No exam data present   Generalized: Well developed, in no acute distress  Head: normocephalic and atraumatic. Oropharynx benign  Neck: Supple, no carotid bruits  Cardiac: Irregular rate and rhythm, soft 2/6 systolic murmur  Musculoskeletal: No deformity   Neurological examination  Mentation: Alert oriented to time, place, history taking. Follows all commands speech and language fluent Cranial nerve II-XII: Pupils were equal round reactive to light extraocular movements were full, visual field were full on confrontational test. Facial asymmetry is not present. Minimal depression of the right NL fold.  Sensation diminished on the right side of the face. Hearing was intact to finger rubbing bilaterally. Uvula tongue midline. head turning and shoulder  shrug and were normal and symmetric.Tongue protrusion into cheek strength was normal. Motor: The patient has good strength in all 4 extremities, with the exception that there is 4/5 grip strength of the right hand with decreased dexterity, 4+/5 strength proximately in the right leg, left extremities are of normal strength.  Sensory: Soft touch sensation is symmetric on the legs, slightly  decreased on the right face and arm. astereognosia right hand and mild sensory ataxia RUE  Coordination: The patient has good finger-nose-finger and heel-to-shin bilaterally, except for minimal ataxia of the right arm.  Gait and station: Gait is normal. Tandem gait is normal. Romberg is negative.  Reflexes: Deep tendon reflexes are symmetric, but are depressed.    Images and labs  I have personally reviewed the radiological images below and agree with the radiology interpretations.  04/06/13 - MRI and MRA 1. Moderate-sized acute posterior left MCA infarct. Edema without significant mass effect. No associated hemorrhage at this time. 2. Underlying small areas of chronic small and medium-sized vessel ischemia. 3. Occluded left MCA posterior M2 branch about 10 mm beyond its origin. 4. Negative more proximal anterior circulation and otherwise negative intracranial MRA.  07/17/13 CT abdomen and pelvis IMPRESSION: 1. No acute intra-abdominal findings. 2. Cholelithiasis without cholecystitis.  04/07/14 CUS Right: moderate to severe soft plaque origin and proximal ICA and ECA. 60-79% ICA stenosis. Left: mild to moderate soft plaque CCA and origin and proximal ICA and ECA. 1-39% ICA stenosis. Bilateral vertebral artery flow is antegrade. ICA/CCA ratio: R-3.15 L-1.0  06/15/13 2D echo - Left ventricle: The cavity size was normal. Wall thickness was normal. Systolic function was normal. The estimated ejection fraction was in the range of 55% to 60%. Wall motion was normal; there were no regional wall motion abnormalities. - Aortic valve: Trivial regurgitation. - Mitral valve: The findings are consistent with mild stenosis. Mild regurgitation. Mean gradient: 91mm Hg (D). Peak gradient: 4mm Hg (D). Valve area by pressure half-time: 1.72cm^2. Valve area by continuity equation (using LVOT flow): 1.09cm^2. - Left atrium: The atrium was moderately dilated. - Right atrium: The  atrium was moderately dilated.  04/10/13 TEE - Left ventricle: Systolic function was normal. The estimated ejection fraction was in the range of 55% to 60%. Wall motion was normal; there were no regional wall motion abnormalities. - Aortic valve: No evidence of vegetation. - Mitral valve: Moderate thickening, consistent with rheumatic disease. The findings are consistent with mild stenosis. Mild regurgitation. - Left atrium: The atrium was moderately dilated. There was mild spontaneous echo contrast ("smoke") in the appendage. - Right atrium: There was mild spontaneous echo contrast ("smoke") in the cavity. - Atrial septum: No defect or patent foramen ovale was identified. Echo contrast study showed no right-to-left atrial level shunt, following an increase in RA pressure induced by provocative maneuvers. - Tricuspid valve: No evidence of vegetation. - Pulmonic valve: No evidence of vegetation. - Pericardium, extracardiac: A trivial pericardial effusion was identified. Impressions: - Small oscillating density on anteriorMV leaflet of uncertain etiology (cannot R/O vegetation); suggest blood cultures and clinical correlation. 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 Negative saline microcavitation study.  Component     Latest Ref Rng 08/30/2012 04/06/2013 05/31/2013  Cholesterol     0 - 200 mg/dL 176  126  Triglycerides     <150 mg/dL 86  96  HDL     >39 mg/dL 52  45  Total CHOL/HDL Ratio      3.4  2.8  VLDL  0 - 40 mg/dL 17  19  LDL (calc)     0 - 99 mg/dL 107 (H)  62  Hgb A1c MFr Bld     <5.7 %  6.3 (H)   Mean Plasma Glucose     <117 mg/dL  134 (H)     ASSESSMENT   63 y.o. year old female  has a past medical history of HTN, Hx of rheumatic fever, HLD Hyperlipidemia, Mitral stenosis, OSA (obstructive sleep apnea) was admitted in 03/2013 for posterior left MCA infarct due to M2 branch occlusion.  Infarct felt to be embolic secondary to atrial fibrillation which was detected by loop recorder post-discharge.  TEE also found possible vegetations on MV and AV, endocarditis vs. Lambl's excresence. Pt finished 6 weeks of IV antibiotics anyway. The patient has a residual mild weakness mainly involving the right hand dexterity and right leg proximally. Pt on coumadin and lipitor for stroke prevention and INR on the goal.  PLAN: - continue coumadin and lipitor for stroke prevention - INR goal 2-3 - check BP at home - follow up with cardiologist - Follow up with your primary care physician for stroke risk factor modification. Recommend maintain blood pressure goal <130/80, diabetes with hemoglobin A1c goal below 6.5% and lipids with LDL cholesterol goal below 70 mg/dL.  - will monitor CUS for right ICA stenosis - RTC in 6 months.  Rosalin Hawking, MD PhD [facility] [address] [place], [place] [address] [phone]  Orders Placed This Encounter  Procedures  . US Carotid Bilateral    Standing Status: Future     Number of Occurrences:      Standing Expiration Date: 06/11/2015    Order Specific Question:  Reason for Exam (SYMPTOM  OR DIAGNOSIS REQUIRED)    Answer:  right ICA stenosis    Order Specific Question:  Preferred imaging location?    Answer:  Internal   Patient Instructions  - continue coumadin and lipitor for stroke prevention - follow up with PCP for INR monitoring, INR goal 2-3 - check BP at home - continue to follow up with your cardiologist - Follow up with your primary care physician for stroke risk factor modification. Recommend maintain blood pressure goal <130/80, diabetes with hemoglobin A1c goal below 6.5% and lipids with LDL cholesterol goal below 70 mg/dL.  - will check carotid doppler for monitoring right ICA stenosis - follow up in 6 months.

## 2014-04-12 ENCOUNTER — Ambulatory Visit (INDEPENDENT_AMBULATORY_CARE_PROVIDER_SITE_OTHER): Payer: BC Managed Care – PPO | Admitting: *Deleted

## 2014-04-12 ENCOUNTER — Encounter: Payer: Self-pay | Admitting: Internal Medicine

## 2014-04-12 DIAGNOSIS — I639 Cerebral infarction, unspecified: Secondary | ICD-10-CM

## 2014-04-12 LAB — MDC_IDC_ENUM_SESS_TYPE_REMOTE

## 2014-04-15 ENCOUNTER — Ambulatory Visit (INDEPENDENT_AMBULATORY_CARE_PROVIDER_SITE_OTHER): Payer: BC Managed Care – PPO | Admitting: *Deleted

## 2014-04-15 DIAGNOSIS — I4891 Unspecified atrial fibrillation: Secondary | ICD-10-CM

## 2014-04-15 DIAGNOSIS — Z5181 Encounter for therapeutic drug level monitoring: Secondary | ICD-10-CM

## 2014-04-15 LAB — POCT INR: INR: 2.5

## 2014-04-16 ENCOUNTER — Ambulatory Visit: Payer: BC Managed Care – PPO | Admitting: Neurology

## 2014-04-16 ENCOUNTER — Other Ambulatory Visit: Payer: Self-pay

## 2014-04-16 ENCOUNTER — Other Ambulatory Visit: Payer: Self-pay | Admitting: Family

## 2014-04-16 DIAGNOSIS — I10 Essential (primary) hypertension: Secondary | ICD-10-CM

## 2014-04-16 MED ORDER — AMLODIPINE BESYLATE 10 MG PO TABS: ORAL_TABLET | ORAL | Status: DC

## 2014-04-17 NOTE — Progress Notes (Signed)
Loop recorder 

## 2014-04-18 ENCOUNTER — Encounter (HOSPITAL_COMMUNITY): Payer: Self-pay | Admitting: Internal Medicine

## 2014-04-22 ENCOUNTER — Ambulatory Visit (INDEPENDENT_AMBULATORY_CARE_PROVIDER_SITE_OTHER): Payer: BC Managed Care – PPO | Admitting: Family

## 2014-04-22 ENCOUNTER — Encounter: Payer: Self-pay | Admitting: Family

## 2014-04-22 VITALS — BP 110/70 | HR 69 | Temp 98.6°F | Resp 16 | Ht 63.0 in | Wt 174.4 lb

## 2014-04-22 DIAGNOSIS — I10 Essential (primary) hypertension: Secondary | ICD-10-CM

## 2014-04-22 DIAGNOSIS — E785 Hyperlipidemia, unspecified: Secondary | ICD-10-CM

## 2014-04-22 DIAGNOSIS — M25579 Pain in unspecified ankle and joints of unspecified foot: Secondary | ICD-10-CM

## 2014-04-22 MED ORDER — METOPROLOL SUCCINATE ER 25 MG PO TB24: ORAL_TABLET | ORAL | Status: DC

## 2014-04-22 MED ORDER — ATORVASTATIN CALCIUM 80 MG PO TABS: ORAL_TABLET | ORAL | Status: DC

## 2014-04-22 MED ORDER — POTASSIUM CHLORIDE CRYS ER 20 MEQ PO TBCR: 20.0000 meq | EXTENDED_RELEASE_TABLET | Freq: Every day | ORAL | Status: DC

## 2014-04-22 MED ORDER — AMLODIPINE BESYLATE 10 MG PO TABS: ORAL_TABLET | ORAL | Status: DC

## 2014-04-22 NOTE — Assessment & Plan Note (Signed)
BP stable on current meds, continue same. Obtain bmet.

## 2014-04-22 NOTE — Patient Instructions (Signed)
Please complete lab work prior to leaving. Please follow up in 4 months.

## 2014-04-22 NOTE — Progress Notes (Signed)
Pre visit review using our clinic review tool, if applicable. No additional management support is needed unless otherwise documented below in the visit note. 

## 2014-04-22 NOTE — Progress Notes (Signed)
Subjective:    Patient ID: Marie Malone, female    DOB: 06/15/1950, 63 y.o.   MRN: [id number]  HPI   Marie Malone is a 63 yr old female who presents today for follow up of multiple medical problems.  1) HTN- Patient is currently maintained on the following medications for blood pressure: amlodipine, toprol xl Patient reports good compliance with blood pressure medications. Patient denies chest pain, shortness of breath or swelling. Last 3 blood pressure readings in our office are as follows: BP Readings from Last 3 Encounters:  04/22/14 110/70  04/09/14 127/64  02/13/14 141/64   2) Hyperlipidemia- Patient is currently maintained on the following medication for hyperlipidemia: lipitor 80mg . Last lipid panel as follows:  Lab Results  Component Value Date   CHOL 126 05/31/2013   HDL 45 05/31/2013   LDLCALC 62 05/31/2013   TRIG 96 05/31/2013   CHOLHDL 2.8 05/31/2013  Patient denies myalgia. Patient reports fair compliance with low fat/low cholesterol diet.   3) Pain- reports pain in hands/feet at night x 5 months. She reports that the pain is worse at night when she tries to sleep. Like cramping maybe- but she cannot describe.  Resolves with tablespoon of mustard. Only occurs about once a week "after a real busy day.          Review of Systems See HPI  Past Medical History  Diagnosis Date  . Hypertension   . Stroke   . History of rheumatic fever     as a child  . Hyperlipidemia   . Mitral stenosis   . OSA (obstructive sleep apnea)     mild per sleep study 2008  . Heart murmur   . Complication of anesthesia     difficult to wake up from anesthesia  . Pneumonia     double pneumonia once  . Atrial fibrillation     History   Social History  . Marital Status: Married    Spouse Name: N/A    Number of Children: 2  . Years of Education: Associates   Occupational History  . Not on file.   Social History Main Topics  . Smoking status: Former Smoker -- 1.00  packs/day for 5 years    Types: Cigarettes    Start date: 07/15/1970    Quit date: 05/11/1975  . Smokeless tobacco: Never Used     Comment: quit smoking 36 years ago  . Alcohol Use: No     Comment: drank years ago when young  . Drug Use: No  . Sexual Activity: Not on file   Other Topics Concern  . Not on file   Social History Narrative   Retired Archivist   Married   She has 2 grown children-   Daughter lives with her   Son lives in [place]   6 grandchildren    Past Surgical History  Procedure Laterality Date  . Cardiac surgery  2008    "balloon surgery at [facility]"  . Abdominal hysterectomy    . Colonoscopy w/ polypectomy    . Appendectomy  1989    appendix ruptured,had peritonitis  . Tee without cardioversion N/A 04/10/2013    Procedure: TRANSESOPHAGEAL ECHOCARDIOGRAM (TEE);  Surgeon: [name], MD;  Location: [facility] ENDOSCOPY;  Service: Cardiovascular;  Laterality: N/A;  . Loop recorder implant  04-10-2013    MDT LinQ implanted by Dr Rayann Heman for cryptogenic stroke  . Loop recorder implant N/A 04/10/2013    Procedure: LOOP RECORDER IMPLANT;  Surgeon: Coralyn Mark, MD;  Location: [facility] CATH LAB;  Service: Cardiovascular;  Laterality: N/A;    Family History  Problem Relation Age of Onset  . Diabetes Mother   . Stroke Mother   . Diabetes Father   . Heart disease Father     CHF  . Cancer Father     kidney  . Diabetes Sister   . Diabetes Brother   . Hypertension Daughter     No Known Allergies  Current Outpatient Prescriptions on File Prior to Visit  Medication Sig Dispense Refill  . acetaminophen (TYLENOL) 325 MG tablet Take 650 mg by mouth as needed.    [name] Kitchen amLODipine (NORVASC) 10 MG tablet TAKE ONE TABLET BY MOUTH ONCE DAILY 30 tablet 1  . atorvastatin (LIPITOR) 80 MG tablet TAKE ONE TABLET BY MOUTH ONCE DAILY AT 6 PM 30 tablet 3  . diphenhydrAMINE (BENADRYL) 25 mg capsule Take 25 mg by mouth as needed.    . metoprolol succinate (TOPROL-XL) 25 MG  24 hr tablet TAKE ONE TABLET BY MOUTH ONCE DAILY 30 tablet 3  . potassium chloride SA (K-DUR,KLOR-CON) 20 MEQ tablet Take 1 tablet (20 mEq total) by mouth daily. 30 tablet 3  . warfarin (COUMADIN) 5 MG tablet TAKE AS DIRECTED BY COUMADIN CLINIC 40 tablet 3   No current facility-administered medications on file prior to visit.    BP 110/70 mmHg  Pulse 69  Temp(Src) 98.6 F (37 C) (Oral)  Resp 16  Ht 5\' 3"  (1.6 m)  Wt 174 lb 6.4 oz (79.107 kg)  BMI 30.90 kg/m2  SpO2 99%       Objective:   Physical Exam  Constitutional: She is oriented to person, place, and time. She appears well-developed and well-nourished. No distress.  Cardiovascular: Normal rate and regular rhythm.   No murmur heard. Pulmonary/Chest: Effort normal and breath sounds normal. No respiratory distress. She has no wheezes. She has no rales. She exhibits no tenderness.  Musculoskeletal: She exhibits no edema.  Neurological: She is alert and oriented to person, place, and time.  Psychiatric: She has a normal mood and affect. Her behavior is normal. Judgment and thought content normal.          Assessment & Plan:  Pain in hands/feet- new- suspect OA, but will check b12 and folate to rule out deficiency.

## 2014-04-22 NOTE — Assessment & Plan Note (Signed)
Obtain non-fasting lipids. Continue statin.

## 2014-04-23 ENCOUNTER — Encounter: Payer: Self-pay | Admitting: Family

## 2014-04-23 LAB — LIPID PANEL
CHOL/HDL RATIO: 3
CHOLESTEROL: 128 mg/dL (ref 0–200)
HDL: 44.5 mg/dL (ref >39.00)
LDL CALC: 55 mg/dL (ref 0–99)
NonHDL: 83.5
Triglycerides: 145 mg/dL (ref 0.0–149.0)
VLDL: 29 mg/dL (ref 0.0–40.0)

## 2014-04-23 LAB — BASIC METABOLIC PANEL
BUN: 23 mg/dL (ref 6–23)
CHLORIDE: 111 meq/L (ref 96–112)
CO2: 19 meq/L (ref 19–32)
Calcium: 9.4 mg/dL (ref 8.4–10.5)
Creatinine, Ser: 1.1 mg/dL (ref 0.4–1.2)
GFR: 66.04 mL/min (ref >60.00)
Glucose, Bld: 78 mg/dL (ref 70–99)
Potassium: 3.8 mEq/L (ref 3.5–5.1)
Sodium: 138 mEq/L (ref 135–145)

## 2014-04-23 LAB — FOLATE: FOLATE: 9 ng/mL (ref >5.9)

## 2014-04-23 LAB — VITAMIN B12: Vitamin B-12: 309 pg/mL (ref 211–911)

## 2014-05-13 ENCOUNTER — Ambulatory Visit (INDEPENDENT_AMBULATORY_CARE_PROVIDER_SITE_OTHER): Payer: BC Managed Care – PPO | Admitting: *Deleted

## 2014-05-13 DIAGNOSIS — I639 Cerebral infarction, unspecified: Secondary | ICD-10-CM

## 2014-05-13 LAB — MDC_IDC_ENUM_SESS_TYPE_REMOTE

## 2014-05-16 ENCOUNTER — Other Ambulatory Visit: Payer: BC Managed Care – PPO

## 2014-05-17 NOTE — Progress Notes (Signed)
Loop recorder 

## 2014-05-27 ENCOUNTER — Ambulatory Visit (INDEPENDENT_AMBULATORY_CARE_PROVIDER_SITE_OTHER): Payer: BC Managed Care – PPO

## 2014-05-27 DIAGNOSIS — I4891 Unspecified atrial fibrillation: Secondary | ICD-10-CM

## 2014-05-27 DIAGNOSIS — Z5181 Encounter for therapeutic drug level monitoring: Secondary | ICD-10-CM

## 2014-05-27 DIAGNOSIS — I6310 Cerebral infarction due to embolism of unspecified precerebral artery: Secondary | ICD-10-CM

## 2014-05-27 LAB — POCT INR: INR: 4.4

## 2014-06-11 ENCOUNTER — Ambulatory Visit (INDEPENDENT_AMBULATORY_CARE_PROVIDER_SITE_OTHER): Payer: BC Managed Care – PPO | Admitting: *Deleted

## 2014-06-11 DIAGNOSIS — I6310 Cerebral infarction due to embolism of unspecified precerebral artery: Secondary | ICD-10-CM

## 2014-06-11 LAB — MDC_IDC_ENUM_SESS_TYPE_REMOTE

## 2014-06-13 ENCOUNTER — Encounter: Payer: Self-pay | Admitting: Internal Medicine

## 2014-06-13 ENCOUNTER — Other Ambulatory Visit: Payer: BC Managed Care – PPO

## 2014-06-14 NOTE — Progress Notes (Signed)
Loop recorder 

## 2014-06-26 ENCOUNTER — Ambulatory Visit (INDEPENDENT_AMBULATORY_CARE_PROVIDER_SITE_OTHER): Payer: BC Managed Care – PPO | Admitting: *Deleted

## 2014-06-26 DIAGNOSIS — I6310 Cerebral infarction due to embolism of unspecified precerebral artery: Secondary | ICD-10-CM

## 2014-06-26 DIAGNOSIS — I4891 Unspecified atrial fibrillation: Secondary | ICD-10-CM

## 2014-06-26 DIAGNOSIS — Z5181 Encounter for therapeutic drug level monitoring: Secondary | ICD-10-CM

## 2014-06-26 LAB — POCT INR: INR: 2.2

## 2014-06-27 ENCOUNTER — Ambulatory Visit (INDEPENDENT_AMBULATORY_CARE_PROVIDER_SITE_OTHER): Payer: BC Managed Care – PPO

## 2014-06-27 DIAGNOSIS — I6521 Occlusion and stenosis of right carotid artery: Secondary | ICD-10-CM

## 2014-07-11 ENCOUNTER — Ambulatory Visit (INDEPENDENT_AMBULATORY_CARE_PROVIDER_SITE_OTHER): Payer: BC Managed Care – PPO | Admitting: *Deleted

## 2014-07-11 DIAGNOSIS — I6310 Cerebral infarction due to embolism of unspecified precerebral artery: Secondary | ICD-10-CM

## 2014-07-11 LAB — MDC_IDC_ENUM_SESS_TYPE_REMOTE

## 2014-07-12 ENCOUNTER — Encounter: Payer: Self-pay | Admitting: Internal Medicine

## 2014-07-16 NOTE — Progress Notes (Signed)
Loop recorder 

## 2014-07-31 ENCOUNTER — Ambulatory Visit (INDEPENDENT_AMBULATORY_CARE_PROVIDER_SITE_OTHER): Payer: BC Managed Care – PPO | Admitting: *Deleted

## 2014-07-31 DIAGNOSIS — I4891 Unspecified atrial fibrillation: Secondary | ICD-10-CM

## 2014-07-31 DIAGNOSIS — Z5181 Encounter for therapeutic drug level monitoring: Secondary | ICD-10-CM

## 2014-07-31 LAB — POCT INR: INR: 2.6

## 2014-08-09 ENCOUNTER — Ambulatory Visit (INDEPENDENT_AMBULATORY_CARE_PROVIDER_SITE_OTHER): Payer: BC Managed Care – PPO | Admitting: *Deleted

## 2014-08-09 DIAGNOSIS — I639 Cerebral infarction, unspecified: Secondary | ICD-10-CM | POA: Diagnosis not present

## 2014-08-13 NOTE — Progress Notes (Signed)
Loop recorder 

## 2014-08-14 ENCOUNTER — Encounter: Payer: Self-pay | Admitting: Internal Medicine

## 2014-09-03 LAB — MDC_IDC_ENUM_SESS_TYPE_REMOTE

## 2014-09-04 ENCOUNTER — Ambulatory Visit (INDEPENDENT_AMBULATORY_CARE_PROVIDER_SITE_OTHER): Payer: BC Managed Care – PPO | Admitting: *Deleted

## 2014-09-04 DIAGNOSIS — I4891 Unspecified atrial fibrillation: Secondary | ICD-10-CM | POA: Diagnosis not present

## 2014-09-04 DIAGNOSIS — Z5181 Encounter for therapeutic drug level monitoring: Secondary | ICD-10-CM

## 2014-09-04 LAB — POCT INR: INR: 2.7

## 2014-09-09 ENCOUNTER — Ambulatory Visit (INDEPENDENT_AMBULATORY_CARE_PROVIDER_SITE_OTHER): Payer: BC Managed Care – PPO | Admitting: *Deleted

## 2014-09-09 DIAGNOSIS — I639 Cerebral infarction, unspecified: Secondary | ICD-10-CM

## 2014-09-11 NOTE — Progress Notes (Signed)
Loop recorder 

## 2014-09-17 ENCOUNTER — Encounter: Payer: Self-pay | Admitting: Internal Medicine

## 2014-09-26 LAB — CUP PACEART REMOTE DEVICE CHECK: Date Time Interrogation Session: 20160519114004

## 2014-10-09 ENCOUNTER — Ambulatory Visit (INDEPENDENT_AMBULATORY_CARE_PROVIDER_SITE_OTHER): Payer: BC Managed Care – PPO | Admitting: *Deleted

## 2014-10-09 ENCOUNTER — Ambulatory Visit: Payer: BC Managed Care – PPO | Admitting: Neurology

## 2014-10-09 DIAGNOSIS — I639 Cerebral infarction, unspecified: Secondary | ICD-10-CM | POA: Diagnosis not present

## 2014-10-10 ENCOUNTER — Encounter: Payer: Self-pay | Admitting: Internal Medicine

## 2014-10-11 NOTE — Progress Notes (Signed)
Loop recorder 

## 2014-10-16 ENCOUNTER — Ambulatory Visit (INDEPENDENT_AMBULATORY_CARE_PROVIDER_SITE_OTHER): Payer: BC Managed Care – PPO | Admitting: *Deleted

## 2014-10-16 DIAGNOSIS — I4891 Unspecified atrial fibrillation: Secondary | ICD-10-CM

## 2014-10-16 DIAGNOSIS — I6310 Cerebral infarction due to embolism of unspecified precerebral artery: Secondary | ICD-10-CM

## 2014-10-16 DIAGNOSIS — Z5181 Encounter for therapeutic drug level monitoring: Secondary | ICD-10-CM | POA: Diagnosis not present

## 2014-10-16 LAB — POCT INR: INR: 2.6

## 2014-10-23 LAB — CUP PACEART REMOTE DEVICE CHECK: Date Time Interrogation Session: 20160615134920

## 2014-11-05 ENCOUNTER — Encounter: Payer: Self-pay | Admitting: Internal Medicine

## 2014-11-08 ENCOUNTER — Ambulatory Visit (INDEPENDENT_AMBULATORY_CARE_PROVIDER_SITE_OTHER): Payer: BC Managed Care – PPO | Admitting: *Deleted

## 2014-11-08 DIAGNOSIS — I6310 Cerebral infarction due to embolism of unspecified precerebral artery: Secondary | ICD-10-CM

## 2014-11-13 LAB — CUP PACEART REMOTE DEVICE CHECK: Date Time Interrogation Session: 20160706170648

## 2014-11-13 NOTE — Progress Notes (Signed)
Loop recorder 

## 2014-11-19 ENCOUNTER — Encounter: Payer: Self-pay | Admitting: Internal Medicine

## 2014-11-19 ENCOUNTER — Telehealth: Payer: Self-pay | Admitting: *Deleted

## 2014-11-19 ENCOUNTER — Ambulatory Visit (INDEPENDENT_AMBULATORY_CARE_PROVIDER_SITE_OTHER): Payer: BC Managed Care – PPO | Admitting: *Deleted

## 2014-11-19 DIAGNOSIS — R55 Syncope and collapse: Secondary | ICD-10-CM

## 2014-11-19 LAB — CUP PACEART INCLINIC DEVICE CHECK: Date Time Interrogation Session: 20160712091027

## 2014-11-19 MED ORDER — METOPROLOL SUCCINATE ER 25 MG PO TB24: ORAL_TABLET | ORAL | Status: DC

## 2014-11-19 NOTE — Telephone Encounter (Signed)
Received fax request for toprol XL 25mg  refill. 30 day supply sent to pharmacy. Pt was due for follow up in April and is past due. Please call pt to arrange appt. Thanks!

## 2014-11-19 NOTE — Progress Notes (Signed)
ILR software upgraded. Pt informed to send manual transmission. Follow up as planned.

## 2014-11-22 NOTE — Telephone Encounter (Signed)
Patient scheduled follow up for 12/10/2014

## 2014-11-26 ENCOUNTER — Encounter: Payer: Self-pay | Admitting: Internal Medicine

## 2014-11-27 ENCOUNTER — Encounter: Payer: Self-pay | Admitting: Internal Medicine

## 2014-11-27 ENCOUNTER — Ambulatory Visit (INDEPENDENT_AMBULATORY_CARE_PROVIDER_SITE_OTHER): Payer: BC Managed Care – PPO | Admitting: *Deleted

## 2014-11-27 DIAGNOSIS — I6310 Cerebral infarction due to embolism of unspecified precerebral artery: Secondary | ICD-10-CM | POA: Diagnosis not present

## 2014-11-27 DIAGNOSIS — Z5181 Encounter for therapeutic drug level monitoring: Secondary | ICD-10-CM

## 2014-11-27 DIAGNOSIS — I4891 Unspecified atrial fibrillation: Secondary | ICD-10-CM

## 2014-11-27 LAB — POCT INR: INR: 2.6

## 2014-12-02 NOTE — Progress Notes (Signed)
HPI: FU MS and atrial fibrillation. Patient has a history of rheumatic fever. Cardiac catheterization in [facility] in June of 2008 showed normal LV function, no coronary disease, moderate mitral stenosis with a valve area of 1.1 cm, and severe pulmonary hypertension. TEE in [facility] in 2008 showed normal LV function and moderate mitral stenosis with a valve area of 1.4 cm, mild MR. Patient had valvuloplasty at [facility] in 2008. Nuclear study in April of 2014 showed an ejection fraction of 56%. There was a fixed anterior defect consistent with soft tissue attenuation but no ischemia. Abdominal ultrasound in April of 2014 normal. Patient was admitted 11/14 with a CVA. Transesophageal echocardiogram 12/14 showed normal LV function. There was mild mitral stenosis and mild mitral regurgitation. The left atrium was dilated and there was spontaneous contrast in the left atrial appendage but no thrombus. There was mild spontaneous contrast in the right atrium. There was a small oscillating density on the anterior mitral valve leaflet of uncertain etiology and vegetation could not be excluded. There was a small oscillating density on the aortic valve felt most likely to be Lambl's excrescence. Patient was treated for SBE; blood cultures negative. Patient did have an implantable loop placed prior to discharge. This ultimately revealed paroxysmal atrial fibrillation and anticoagulation was initiated. Echo repeated in February 2015 and showed normal LV function, mild mitral stenosis and mitral regurgitation, moderate biatrial enlargement and trace aortic insufficiency. Carotid Dopplers February 2016 showed 50-60% right stenosis. Since last seen,   Current Outpatient Prescriptions  Medication Sig Dispense Refill  . acetaminophen (TYLENOL) 325 MG tablet Take 650 mg by mouth as needed.    [name] Kitchen amLODipine (NORVASC) 10 MG tablet TAKE ONE TABLET BY MOUTH ONCE DAILY 30 tablet 5  . atorvastatin (LIPITOR) 80 MG tablet TAKE  ONE TABLET BY MOUTH ONCE DAILY AT 6 PM 30 tablet 5  . diphenhydrAMINE (BENADRYL) 25 mg capsule Take 25 mg by mouth as needed.    . metoprolol succinate (TOPROL-XL) 25 MG 24 hr tablet TAKE ONE TABLET BY MOUTH ONCE DAILY 30 tablet 0  . potassium chloride SA (K-DUR,KLOR-CON) 20 MEQ tablet Take 1 tablet (20 mEq total) by mouth daily. 30 tablet 5  . warfarin (COUMADIN) 5 MG tablet TAKE AS DIRECTED BY COUMADIN CLINIC 40 tablet 3   No current facility-administered medications for this visit.     Past Medical History  Diagnosis Date  . Hypertension   . Stroke   . History of rheumatic fever     as a child  . Hyperlipidemia   . Mitral stenosis   . OSA (obstructive sleep apnea)     mild per sleep study 2008  . Heart murmur   . Complication of anesthesia     difficult to wake up from anesthesia  . Pneumonia     double pneumonia once  . Atrial fibrillation     Past Surgical History  Procedure Laterality Date  . Cardiac surgery  2008    "balloon surgery at [facility]"  . Abdominal hysterectomy    . Colonoscopy w/ polypectomy    . Appendectomy  1989    appendix ruptured,had peritonitis  . Tee without cardioversion N/A 04/10/2013    Procedure: TRANSESOPHAGEAL ECHOCARDIOGRAM (TEE);  Surgeon: [name], MD;  Location: [facility] ENDOSCOPY;  Service: Cardiovascular;  Laterality: N/A;  . Loop recorder implant  04-10-2013    MDT LinQ implanted by Dr Rayann Heman for cryptogenic stroke  . Loop recorder implant N/A 04/10/2013  Procedure: LOOP RECORDER IMPLANT;  Surgeon: Coralyn Mark, MD;  Location: [facility] CATH LAB;  Service: Cardiovascular;  Laterality: N/A;    History   Social History  . Marital Status: Married    Spouse Name: N/A  . Number of Children: 2  . Years of Education: Associates   Occupational History  . Not on file.   Social History Main Topics  . Smoking status: Former Smoker -- 1.00 packs/day for 5 years    Types: Cigarettes    Start date: 07/15/1970    Quit date: 05/11/1975  .  Smokeless tobacco: Never Used     Comment: quit smoking 36 years ago  . Alcohol Use: No     Comment: drank years ago when young  . Drug Use: No  . Sexual Activity: Not on file   Other Topics Concern  . 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 Remaining systems are negative.  Physical Exam: Well-developed well-nourished in no acute distress.  Skin is warm and dry.  HEENT is normal.  Neck is supple.  Chest is clear to auscultation with normal expansion.  Cardiovascular exam is regular rate and rhythm.  Abdominal exam nontender or distended. No masses palpated. Extremities show no edema. neuro grossly intact  ECG sinus rhythm at a rate of 72. First-degree AV block.

## 2014-12-03 ENCOUNTER — Ambulatory Visit (INDEPENDENT_AMBULATORY_CARE_PROVIDER_SITE_OTHER): Payer: BC Managed Care – PPO | Admitting: Cardiology

## 2014-12-03 ENCOUNTER — Encounter: Payer: Self-pay | Admitting: *Deleted

## 2014-12-03 ENCOUNTER — Encounter: Payer: Self-pay | Admitting: Cardiology

## 2014-12-03 VITALS — BP 138/74 | HR 72 | Ht 62.0 in | Wt 180.0 lb

## 2014-12-03 DIAGNOSIS — I480 Paroxysmal atrial fibrillation: Secondary | ICD-10-CM | POA: Diagnosis not present

## 2014-12-03 DIAGNOSIS — E785 Hyperlipidemia, unspecified: Secondary | ICD-10-CM | POA: Diagnosis not present

## 2014-12-03 DIAGNOSIS — I10 Essential (primary) hypertension: Secondary | ICD-10-CM

## 2014-12-03 DIAGNOSIS — I059 Rheumatic mitral valve disease, unspecified: Secondary | ICD-10-CM | POA: Diagnosis not present

## 2014-12-03 NOTE — Assessment & Plan Note (Signed)
Continue statin. 

## 2014-12-03 NOTE — Assessment & Plan Note (Signed)
Blood pressure controlled. Continue present medications. 

## 2014-12-03 NOTE — Assessment & Plan Note (Signed)
Patient in sinus rhythm today. Given history of CVA she will need long-term Coumadin. Check hemoglobin. Continue Toprol.

## 2014-12-03 NOTE — Assessment & Plan Note (Signed)
History of balloon valvuloplasty. Mild mitral stenosis on previous echo. Plan repeat study. Continue SBE prophylaxis.

## 2014-12-03 NOTE — Patient Instructions (Addendum)
Your physician wants you to follow-up in: ONE YEAR WITH DR CRENSHAW You will receive a reminder letter in the mail two months in advance. If you don't receive a letter, please call our office to schedule the follow-up appointment.   Your physician has requested that you have an echocardiogram. Echocardiography is a painless test that uses sound waves to create images of your heart. It provides your doctor with information about the size and shape of your heart and how well your heart's chambers and valves are working. This procedure takes approximately one hour. There are no restrictions for this procedure.   Your physician recommends that you HAVE LAB WORK TODAY 

## 2014-12-03 NOTE — Assessment & Plan Note (Signed)
Follow-up carotid Dopplers February 2017.

## 2014-12-04 LAB — CBC
HCT: 38.2 % (ref 36.0–46.0)
Hemoglobin: 12.2 g/dL (ref 12.0–15.0)
MCH: 27.3 pg (ref 26.0–34.0)
MCHC: 31.9 g/dL (ref 30.0–36.0)
MCV: 85.5 fL (ref 78.0–100.0)
PLATELETS: 139 10*3/uL — AB (ref 150–400)
RBC: 4.47 MIL/uL (ref 3.87–5.11)
RDW: 14.8 % (ref 11.5–15.5)
WBC: 3.9 10*3/uL — ABNORMAL LOW (ref 4.0–10.5)

## 2014-12-09 ENCOUNTER — Ambulatory Visit (INDEPENDENT_AMBULATORY_CARE_PROVIDER_SITE_OTHER): Payer: BC Managed Care – PPO | Admitting: *Deleted

## 2014-12-09 DIAGNOSIS — I6310 Cerebral infarction due to embolism of unspecified precerebral artery: Secondary | ICD-10-CM | POA: Diagnosis not present

## 2014-12-10 ENCOUNTER — Encounter: Payer: Self-pay | Admitting: Family

## 2014-12-10 ENCOUNTER — Ambulatory Visit (INDEPENDENT_AMBULATORY_CARE_PROVIDER_SITE_OTHER): Payer: BC Managed Care – PPO | Admitting: Family

## 2014-12-10 VITALS — BP 134/80 | HR 76 | Temp 98.2°F | Resp 16 | Ht 63.0 in | Wt 181.4 lb

## 2014-12-10 DIAGNOSIS — I10 Essential (primary) hypertension: Secondary | ICD-10-CM | POA: Diagnosis not present

## 2014-12-10 DIAGNOSIS — Z0000 Encounter for general adult medical examination without abnormal findings: Secondary | ICD-10-CM

## 2014-12-10 DIAGNOSIS — E785 Hyperlipidemia, unspecified: Secondary | ICD-10-CM

## 2014-12-10 DIAGNOSIS — E876 Hypokalemia: Secondary | ICD-10-CM | POA: Diagnosis not present

## 2014-12-10 LAB — BASIC METABOLIC PANEL
BUN: 16 mg/dL (ref 6–23)
CALCIUM: 9.3 mg/dL (ref 8.4–10.5)
CO2: 29 meq/L (ref 19–32)
Chloride: 106 mEq/L (ref 96–112)
Creatinine, Ser: 0.89 mg/dL (ref 0.40–1.20)
GFR: 82.4 mL/min (ref >60.00)
Glucose, Bld: 88 mg/dL (ref 70–99)
Potassium: 3.9 mEq/L (ref 3.5–5.1)
Sodium: 140 mEq/L (ref 135–145)

## 2014-12-10 LAB — LIPID PANEL
Cholesterol: 170 mg/dL (ref 0–200)
HDL: 58.3 mg/dL (ref >39.00)
LDL CALC: 77 mg/dL (ref 0–99)
NONHDL: 111.48
Total CHOL/HDL Ratio: 3
Triglycerides: 170 mg/dL — ABNORMAL HIGH (ref 0.0–149.0)
VLDL: 34 mg/dL (ref 0.0–40.0)

## 2014-12-10 MED ORDER — METOPROLOL SUCCINATE ER 25 MG PO TB24: ORAL_TABLET | ORAL | Status: DC

## 2014-12-10 MED ORDER — AMLODIPINE BESYLATE 10 MG PO TABS: ORAL_TABLET | ORAL | Status: DC

## 2014-12-10 MED ORDER — ATORVASTATIN CALCIUM 80 MG PO TABS: ORAL_TABLET | ORAL | Status: DC

## 2014-12-10 MED ORDER — POTASSIUM CHLORIDE CRYS ER 20 MEQ PO TBCR: 20.0000 meq | EXTENDED_RELEASE_TABLET | Freq: Every day | ORAL | Status: DC

## 2014-12-10 NOTE — Progress Notes (Signed)
Subjective:    Patient ID: Marie Malone, female    DOB: August 21, 1950, 64 y.o.   MRN: [id number]  HPI  Marie Malone is a 64 yr old female who presents today for follow up. She follows with [name] (cardiology) for hx of mitral stenosis, carotid disease, AF- maintained on coumadin given hx of CVA. Per coumadin clinic.   HTN-  Currently maintained on toprol xl, amlodipine.  Denies CP/SOB or swelling.  BP Readings from Last 3 Encounters:  12/10/14 134/80  12/03/14 138/74  04/22/14 110/70   Hyperlipidemia-  Pt is currently maintained on atorvastatin. Denies myalgia.  Lab Results  Component Value Date   CHOL 128 04/22/2014   HDL 44.50 04/22/2014   LDLCALC 55 04/22/2014   TRIG 145.0 04/22/2014   CHOLHDL 3 04/22/2014   Hypokalemia- maintained on kdur once daily.    Review of Systems    see HPI  Past Medical History  Diagnosis Date  . Hypertension   . Stroke   . History of rheumatic fever     as a child  . Hyperlipidemia   . Mitral stenosis   . OSA (obstructive sleep apnea)     mild per sleep study 2008  . Heart murmur   . Complication of anesthesia     difficult to wake up from anesthesia  . Pneumonia     double pneumonia once  . Atrial fibrillation     History   Social History  . Marital Status: Married    Spouse Name: N/A  . Number of Children: 2  . Years of Education: Associates   Occupational History  . Not on file.   Social History Main Topics  . Smoking status: Former Smoker -- 1.00 packs/day for 5 years    Types: Cigarettes    Start date: 07/15/1970    Quit date: 05/11/1975  . Smokeless tobacco: Never Used     Comment: quit smoking 36 years ago  . Alcohol Use: No     Comment: drank years ago when young  . Drug Use: No  . Sexual Activity: Not on file   Other Topics Concern  . 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 Cardiac surgery  2008    "balloon surgery at [facility]"  . Abdominal hysterectomy    . Colonoscopy w/ polypectomy    . Appendectomy  1989    appendix ruptured,had peritonitis  . Tee without cardioversion N/A 04/10/2013    Procedure: TRANSESOPHAGEAL ECHOCARDIOGRAM (TEE);  Surgeon: [name], MD;  Location: [facility] ENDOSCOPY;  Service: Cardiovascular;  Laterality: N/A;  . Loop recorder implant  04-10-2013    MDT LinQ implanted by Dr Rayann Heman for cryptogenic stroke  . Loop recorder implant N/A 04/10/2013    Procedure: LOOP RECORDER IMPLANT;  Surgeon: Coralyn Mark, MD;  Location: [place] CATH LAB;  Service: Cardiovascular;  Laterality: N/A;    Family History  Problem Relation Age of Onset  . Diabetes Mother   . Stroke Mother   . Diabetes Father   . Heart disease Father     CHF  . Cancer Father     kidney  . Diabetes Sister   . Diabetes Brother   . Hypertension Daughter  No Known Allergies  Current Outpatient Prescriptions on File Prior to Visit  Medication Sig Dispense Refill  . acetaminophen (TYLENOL) 325 MG tablet Take 650 mg by mouth as needed.    [name] Kitchen amLODipine (NORVASC) 10 MG tablet TAKE ONE TABLET BY MOUTH ONCE DAILY 30 tablet 5  . atorvastatin (LIPITOR) 80 MG tablet TAKE ONE TABLET BY MOUTH ONCE DAILY AT 6 PM 30 tablet 5  . diphenhydrAMINE (BENADRYL) 25 mg capsule Take 25 mg by mouth as needed.    . metoprolol succinate (TOPROL-XL) 25 MG 24 hr tablet TAKE ONE TABLET BY MOUTH ONCE DAILY 30 tablet 0  . potassium chloride SA (K-DUR,KLOR-CON) 20 MEQ tablet Take 1 tablet (20 mEq total) by mouth daily. 30 tablet 5  . warfarin (COUMADIN) 5 MG tablet TAKE AS DIRECTED BY COUMADIN CLINIC 40 tablet 3   No current facility-administered medications on file prior to visit.    BP 134/80 mmHg  Pulse 76  Temp(Src) 98.2 F (36.8 C) (Oral)  Resp 16  Ht 5\' 3"  (1.6 m)  Wt 181 lb 6.4 oz (82.283 kg)  BMI 32.14  kg/m2  SpO2 98%    Objective:   Physical Exam  Constitutional: She is oriented to person, place, and time. She appears well-developed and well-nourished.  HENT:  Head: Normocephalic and atraumatic.  Cardiovascular: Normal rate, regular rhythm and normal heart sounds.   No murmur heard. Pulmonary/Chest: Effort normal and breath sounds normal. No respiratory distress. She has no wheezes.  Musculoskeletal: She exhibits no edema.  Neurological: She is alert and oriented to person, place, and time.  Psychiatric: She has a normal mood and affect. Her behavior is normal. Judgment and thought content normal.          Assessment & Plan:

## 2014-12-10 NOTE — Progress Notes (Signed)
Pre visit review using our clinic review tool, if applicable. No additional management support is needed unless otherwise documented below in the visit note. 

## 2014-12-10 NOTE — Patient Instructions (Addendum)
Please complete lab work prior to leaving. Please schedule a complete physical at the front desk.

## 2014-12-10 NOTE — Assessment & Plan Note (Signed)
LDL at goal in December, tolerating statin. Continue same, obtain follow up FLP.

## 2014-12-10 NOTE — Assessment & Plan Note (Signed)
Obtain follow up bmet to assess K.

## 2014-12-10 NOTE — Assessment & Plan Note (Signed)
BP stable on current meds.   

## 2014-12-11 ENCOUNTER — Encounter: Payer: Self-pay | Admitting: Family

## 2014-12-11 NOTE — Progress Notes (Signed)
Loop recorder 

## 2014-12-12 ENCOUNTER — Other Ambulatory Visit: Payer: Self-pay

## 2014-12-12 ENCOUNTER — Ambulatory Visit (HOSPITAL_COMMUNITY): Payer: BC Managed Care – PPO | Attending: Cardiovascular Disease

## 2014-12-12 DIAGNOSIS — I10 Essential (primary) hypertension: Secondary | ICD-10-CM | POA: Diagnosis not present

## 2014-12-12 DIAGNOSIS — I517 Cardiomegaly: Secondary | ICD-10-CM | POA: Insufficient documentation

## 2014-12-12 DIAGNOSIS — I059 Rheumatic mitral valve disease, unspecified: Secondary | ICD-10-CM | POA: Diagnosis not present

## 2014-12-12 DIAGNOSIS — I340 Nonrheumatic mitral (valve) insufficiency: Secondary | ICD-10-CM | POA: Diagnosis not present

## 2015-01-02 ENCOUNTER — Encounter: Payer: Self-pay | Admitting: Internal Medicine

## 2015-01-07 ENCOUNTER — Ambulatory Visit (INDEPENDENT_AMBULATORY_CARE_PROVIDER_SITE_OTHER): Payer: BC Managed Care – PPO | Admitting: *Deleted

## 2015-01-07 ENCOUNTER — Encounter: Payer: Self-pay | Admitting: Internal Medicine

## 2015-01-07 DIAGNOSIS — I6310 Cerebral infarction due to embolism of unspecified precerebral artery: Secondary | ICD-10-CM

## 2015-01-08 ENCOUNTER — Ambulatory Visit (INDEPENDENT_AMBULATORY_CARE_PROVIDER_SITE_OTHER): Payer: BC Managed Care – PPO | Admitting: *Deleted

## 2015-01-08 DIAGNOSIS — Z5181 Encounter for therapeutic drug level monitoring: Secondary | ICD-10-CM

## 2015-01-08 DIAGNOSIS — I6310 Cerebral infarction due to embolism of unspecified precerebral artery: Secondary | ICD-10-CM

## 2015-01-08 DIAGNOSIS — I480 Paroxysmal atrial fibrillation: Secondary | ICD-10-CM

## 2015-01-08 DIAGNOSIS — I4891 Unspecified atrial fibrillation: Secondary | ICD-10-CM | POA: Diagnosis not present

## 2015-01-08 LAB — POCT INR: INR: 4.3

## 2015-01-08 NOTE — Progress Notes (Signed)
Loop recorder 

## 2015-01-22 ENCOUNTER — Ambulatory Visit (INDEPENDENT_AMBULATORY_CARE_PROVIDER_SITE_OTHER): Payer: BC Managed Care – PPO | Admitting: *Deleted

## 2015-01-22 DIAGNOSIS — I6310 Cerebral infarction due to embolism of unspecified precerebral artery: Secondary | ICD-10-CM

## 2015-01-22 DIAGNOSIS — I4891 Unspecified atrial fibrillation: Secondary | ICD-10-CM

## 2015-01-22 DIAGNOSIS — I480 Paroxysmal atrial fibrillation: Secondary | ICD-10-CM

## 2015-01-22 DIAGNOSIS — Z5181 Encounter for therapeutic drug level monitoring: Secondary | ICD-10-CM

## 2015-01-22 LAB — POCT INR: INR: 2.6

## 2015-02-05 LAB — CUP PACEART REMOTE DEVICE CHECK: MDC IDC SESS DTM: 20160831024106

## 2015-02-05 NOTE — Progress Notes (Signed)
Carelink summary report received. Battery status OK. Normal device function. No new symptom, tachy, brady, or pause episodes. 353 AF episodes (burden 28.3%), +warfarin and Toprol-XL 25mg , V rates well controlled. Monthly summary reports and ROV with SK PRN.

## 2015-02-06 ENCOUNTER — Ambulatory Visit (INDEPENDENT_AMBULATORY_CARE_PROVIDER_SITE_OTHER): Payer: BC Managed Care – PPO | Admitting: *Deleted

## 2015-02-06 ENCOUNTER — Encounter: Payer: Self-pay | Admitting: Internal Medicine

## 2015-02-06 DIAGNOSIS — I6310 Cerebral infarction due to embolism of unspecified precerebral artery: Secondary | ICD-10-CM | POA: Diagnosis not present

## 2015-02-07 NOTE — Progress Notes (Signed)
Loop recorder 

## 2015-02-10 ENCOUNTER — Other Ambulatory Visit: Payer: Self-pay | Admitting: Internal Medicine

## 2015-02-18 ENCOUNTER — Encounter: Payer: BC Managed Care – PPO | Admitting: Family

## 2015-02-19 ENCOUNTER — Ambulatory Visit (INDEPENDENT_AMBULATORY_CARE_PROVIDER_SITE_OTHER): Payer: BC Managed Care – PPO | Admitting: *Deleted

## 2015-02-19 DIAGNOSIS — I480 Paroxysmal atrial fibrillation: Secondary | ICD-10-CM

## 2015-02-19 DIAGNOSIS — Z5181 Encounter for therapeutic drug level monitoring: Secondary | ICD-10-CM

## 2015-02-19 DIAGNOSIS — I6310 Cerebral infarction due to embolism of unspecified precerebral artery: Secondary | ICD-10-CM | POA: Diagnosis not present

## 2015-02-19 DIAGNOSIS — I4891 Unspecified atrial fibrillation: Secondary | ICD-10-CM

## 2015-02-19 LAB — POCT INR: INR: 2.6

## 2015-03-03 LAB — CUP PACEART REMOTE DEVICE CHECK: Date Time Interrogation Session: 20160930023727

## 2015-03-03 NOTE — Progress Notes (Signed)
Carelink summary report received. Battery status OK. Normal device function. No new symptom episodes, tachy episodes, brady, or pause episodes. 299 AF episodes (burden 16.2%), +warfarin, avg V rate controlled. Monthly summary reports and ROV with JA PRN.

## 2015-03-10 ENCOUNTER — Ambulatory Visit (INDEPENDENT_AMBULATORY_CARE_PROVIDER_SITE_OTHER): Payer: BC Managed Care – PPO | Admitting: *Deleted

## 2015-03-10 DIAGNOSIS — I6310 Cerebral infarction due to embolism of unspecified precerebral artery: Secondary | ICD-10-CM

## 2015-03-13 NOTE — Progress Notes (Signed)
Loop recorder 

## 2015-03-19 ENCOUNTER — Ambulatory Visit (INDEPENDENT_AMBULATORY_CARE_PROVIDER_SITE_OTHER): Payer: BC Managed Care – PPO | Admitting: *Deleted

## 2015-03-19 DIAGNOSIS — I4891 Unspecified atrial fibrillation: Secondary | ICD-10-CM

## 2015-03-19 DIAGNOSIS — Z5181 Encounter for therapeutic drug level monitoring: Secondary | ICD-10-CM | POA: Diagnosis not present

## 2015-03-19 DIAGNOSIS — I480 Paroxysmal atrial fibrillation: Secondary | ICD-10-CM | POA: Diagnosis not present

## 2015-03-19 DIAGNOSIS — I6310 Cerebral infarction due to embolism of unspecified precerebral artery: Secondary | ICD-10-CM | POA: Diagnosis not present

## 2015-03-19 LAB — POCT INR: INR: 2.1

## 2015-04-05 LAB — CUP PACEART REMOTE DEVICE CHECK: MDC IDC SESS DTM: 20161030030656

## 2015-04-05 NOTE — Progress Notes (Signed)
Carelink summary report received. Battery status OK. Normal device function. No new symptom episodes, tachy episodes, brady, or pause episodes. 300 AF episodes (burden 8.6%), +warfarin and metoprolol, avg V rate well controlled. Monthly summary reports and ROV with JA PRN.

## 2015-04-07 ENCOUNTER — Ambulatory Visit (INDEPENDENT_AMBULATORY_CARE_PROVIDER_SITE_OTHER): Payer: BC Managed Care – PPO | Admitting: *Deleted

## 2015-04-07 DIAGNOSIS — I6310 Cerebral infarction due to embolism of unspecified precerebral artery: Secondary | ICD-10-CM | POA: Diagnosis not present

## 2015-04-08 NOTE — Progress Notes (Signed)
Carelink Summary Report / Loop Recorder 

## 2015-04-11 ENCOUNTER — Encounter: Payer: Self-pay | Admitting: Internal Medicine

## 2015-04-30 ENCOUNTER — Ambulatory Visit (INDEPENDENT_AMBULATORY_CARE_PROVIDER_SITE_OTHER): Payer: BC Managed Care – PPO | Admitting: *Deleted

## 2015-04-30 DIAGNOSIS — Z5181 Encounter for therapeutic drug level monitoring: Secondary | ICD-10-CM | POA: Diagnosis not present

## 2015-04-30 DIAGNOSIS — I480 Paroxysmal atrial fibrillation: Secondary | ICD-10-CM | POA: Diagnosis not present

## 2015-04-30 DIAGNOSIS — I4891 Unspecified atrial fibrillation: Secondary | ICD-10-CM

## 2015-04-30 DIAGNOSIS — I6310 Cerebral infarction due to embolism of unspecified precerebral artery: Secondary | ICD-10-CM

## 2015-04-30 LAB — POCT INR: INR: 2.6

## 2015-05-07 ENCOUNTER — Ambulatory Visit (INDEPENDENT_AMBULATORY_CARE_PROVIDER_SITE_OTHER): Payer: BC Managed Care – PPO | Admitting: *Deleted

## 2015-05-07 DIAGNOSIS — I6310 Cerebral infarction due to embolism of unspecified precerebral artery: Secondary | ICD-10-CM

## 2015-05-08 NOTE — Progress Notes (Signed)
Carelink Summary Report / Loop Recorder 

## 2015-05-17 LAB — CUP PACEART REMOTE DEVICE CHECK: Date Time Interrogation Session: 20161129030824

## 2015-06-06 ENCOUNTER — Ambulatory Visit (INDEPENDENT_AMBULATORY_CARE_PROVIDER_SITE_OTHER): Payer: BC Managed Care – PPO | Admitting: *Deleted

## 2015-06-06 DIAGNOSIS — I6310 Cerebral infarction due to embolism of unspecified precerebral artery: Secondary | ICD-10-CM | POA: Diagnosis not present

## 2015-06-09 NOTE — Progress Notes (Signed)
Carelink Summary Report / Loop Recorder 

## 2015-06-11 ENCOUNTER — Ambulatory Visit (INDEPENDENT_AMBULATORY_CARE_PROVIDER_SITE_OTHER): Payer: BC Managed Care – PPO | Admitting: *Deleted

## 2015-06-11 DIAGNOSIS — Z5181 Encounter for therapeutic drug level monitoring: Secondary | ICD-10-CM

## 2015-06-11 DIAGNOSIS — I6310 Cerebral infarction due to embolism of unspecified precerebral artery: Secondary | ICD-10-CM

## 2015-06-11 DIAGNOSIS — I480 Paroxysmal atrial fibrillation: Secondary | ICD-10-CM

## 2015-06-11 DIAGNOSIS — I4891 Unspecified atrial fibrillation: Secondary | ICD-10-CM

## 2015-06-11 LAB — POCT INR: INR: 2.3

## 2015-06-14 LAB — CUP PACEART REMOTE DEVICE CHECK: Date Time Interrogation Session: 20161229033650

## 2015-06-30 ENCOUNTER — Telehealth: Payer: Self-pay | Admitting: Family

## 2015-06-30 NOTE — Telephone Encounter (Signed)
30 day supply amlodipine sent to pharmacy. Pt is due for fasting physical.  Please call pt to arrange before further refills are needed. Thanks!

## 2015-07-01 MED ORDER — POTASSIUM CHLORIDE CRYS ER 20 MEQ PO TBCR: 20.0000 meq | EXTENDED_RELEASE_TABLET | Freq: Every day | ORAL | Status: DC

## 2015-07-01 MED ORDER — METOPROLOL SUCCINATE ER 25 MG PO TB24: ORAL_TABLET | ORAL | Status: DC

## 2015-07-01 NOTE — Telephone Encounter (Signed)
Pt has been scheduled her CPE for 08/12/15

## 2015-07-01 NOTE — Telephone Encounter (Signed)
Shiquita-- can we find a CPE slot on or before 07/28/15. As indicated below, pt needs CPE before further refills are due and 08/12/15 is beyond 30 days.  Thanks! 30 day supply of metoprolol and potassium sent to pharmacy.

## 2015-07-01 NOTE — Telephone Encounter (Signed)
Pt is also requesting a refill on her potassium chloride , metoprolol succinate     Pharmacy: [facility] [address], [place].

## 2015-07-07 ENCOUNTER — Ambulatory Visit (INDEPENDENT_AMBULATORY_CARE_PROVIDER_SITE_OTHER): Payer: BC Managed Care – PPO | Admitting: *Deleted

## 2015-07-07 DIAGNOSIS — I6310 Cerebral infarction due to embolism of unspecified precerebral artery: Secondary | ICD-10-CM | POA: Diagnosis not present

## 2015-07-07 NOTE — Telephone Encounter (Signed)
Pt has been scheduled for 3/14 at 1:45.

## 2015-07-07 NOTE — Progress Notes (Signed)
Carelink Summary Report / Loop Recorder 

## 2015-07-21 ENCOUNTER — Telehealth: Payer: Self-pay | Admitting: Behavioral Health

## 2015-07-21 ENCOUNTER — Encounter: Payer: Self-pay | Admitting: Behavioral Health

## 2015-07-21 NOTE — Telephone Encounter (Signed)
Pre-Visit Call completed with patient and chart updated.   Pre-Visit Info documented in Specialty Comments under SnapShot.    

## 2015-07-22 ENCOUNTER — Ambulatory Visit (INDEPENDENT_AMBULATORY_CARE_PROVIDER_SITE_OTHER): Payer: BC Managed Care – PPO | Admitting: Family

## 2015-07-22 ENCOUNTER — Other Ambulatory Visit (HOSPITAL_COMMUNITY)
Admission: RE | Admit: 2015-07-22 | Discharge: 2015-07-22 | Disposition: A | Discharge status: Home / Self Care | Payer: BC Managed Care – PPO | Source: Ambulatory Visit | Attending: Family | Admitting: Family

## 2015-07-22 ENCOUNTER — Encounter: Payer: Self-pay | Admitting: Family

## 2015-07-22 ENCOUNTER — Telehealth: Payer: Self-pay | Admitting: Family

## 2015-07-22 VITALS — BP 136/59 | HR 77 | Temp 98.7°F | Resp 18 | Ht 62.5 in | Wt 180.4 lb

## 2015-07-22 DIAGNOSIS — N63 Unspecified lump in unspecified breast: Secondary | ICD-10-CM

## 2015-07-22 DIAGNOSIS — Z959 Presence of cardiac and vascular implant and graft, unspecified: Secondary | ICD-10-CM

## 2015-07-22 DIAGNOSIS — Z01411 Encounter for gynecological examination (general) (routine) with abnormal findings: Secondary | ICD-10-CM | POA: Insufficient documentation

## 2015-07-22 DIAGNOSIS — Z0000 Encounter for general adult medical examination without abnormal findings: Secondary | ICD-10-CM | POA: Diagnosis not present

## 2015-07-22 MED ORDER — AMLODIPINE BESYLATE 10 MG PO TABS: 10.0000 mg | ORAL_TABLET | Freq: Every day | ORAL | Status: DC

## 2015-07-22 MED ORDER — METOPROLOL SUCCINATE ER 25 MG PO TB24: ORAL_TABLET | ORAL | Status: DC

## 2015-07-22 MED ORDER — POTASSIUM CHLORIDE CRYS ER 20 MEQ PO TBCR: 20.0000 meq | EXTENDED_RELEASE_TABLET | Freq: Every day | ORAL | Status: DC

## 2015-07-22 MED ORDER — ATORVASTATIN CALCIUM 80 MG PO TABS: ORAL_TABLET | ORAL | Status: DC

## 2015-07-22 NOTE — Progress Notes (Signed)
Subjective:    Patient ID: Marie Malone, female    DOB: Aug 01, 1950, 65 y.o.   MRN: [id number]  HPI Patient presents today for complete physical.  Immunizations: tetanus 2011.  Diet: poor, feels that her diet could be improved by eating less junk food, fast food.  Exercise: not exercising  Colonoscopy: 2006  Dexa: due Pap Smear: due 5/17 Mammogram: due Dental:  due Vision:  due    Review of Systems  Constitutional: Negative for unexpected weight change.  HENT: Negative for rhinorrhea.   Respiratory: Negative for cough and shortness of breath.   Cardiovascular: Negative for chest pain.  Gastrointestinal: Negative for diarrhea and constipation.  Genitourinary: Negative for dysuria and frequency.  Musculoskeletal: Negative for myalgias.       Notes some arthritis in hands/feet  Skin: Negative for rash.  Neurological: Negative for headaches.  Hematological: Negative for adenopathy.  Psychiatric/Behavioral:       Denies depression/anxiety.   Past Medical History  Diagnosis Date  . Hypertension   . Stroke ([place])   . History of rheumatic fever     as a child  . Hyperlipidemia   . Mitral stenosis   . OSA (obstructive sleep apnea)     mild per sleep study 2008  . Heart murmur   . Complication of anesthesia     difficult to wake up from anesthesia  . Pneumonia     double pneumonia once  . Atrial fibrillation [facility])     Social History   Social History  . Marital Status: Married    Spouse Name: N/A  . Number of Children: 2  . Years of Education: Associates   Occupational History  . Not on file.   Social History Main Topics  . Smoking status: Former Smoker -- 1.00 packs/day for 5 years    Types: Cigarettes    Start date: 07/15/1970    Quit date: 05/11/1975  . Smokeless tobacco: Never Used     Comment: quit smoking 36 years ago  . Alcohol Use: No     Comment: drank years ago when young  . Drug Use: No  . Sexual Activity: Not on file   Other Topics  Concern  . Not on file   Social History Narrative   Retired Archivist   Married   She has 2 grown children-   Daughter lives with her   Son lives in [place]   6 grandchildren    Past Surgical History  Procedure Laterality Date  . Cardiac surgery  2008    "balloon surgery at [facility]"  . Abdominal hysterectomy    . Colonoscopy w/ polypectomy    . Appendectomy  1989    appendix ruptured,had peritonitis  . Tee without cardioversion N/A 04/10/2013    Procedure: TRANSESOPHAGEAL ECHOCARDIOGRAM (TEE);  Surgeon: [name], MD;  Location: [facility] ENDOSCOPY;  Service: Cardiovascular;  Laterality: N/A;  . Loop recorder implant  04-10-2013    MDT LinQ implanted by Dr Rayann Heman for cryptogenic stroke  . Loop recorder implant N/A 04/10/2013    Procedure: LOOP RECORDER IMPLANT;  Surgeon: Coralyn Mark, MD;  Location: [place] CATH LAB;  Service: Cardiovascular;  Laterality: N/A;    Family History  Problem Relation Age of Onset  . Diabetes Mother   . Stroke Mother   . Diabetes Father   . Heart disease Father     CHF  . Cancer Father     kidney  . Diabetes Sister   .  Diabetes Brother   . Hypertension Daughter     No Known Allergies  Current Outpatient Prescriptions on File Prior to Visit  Medication Sig Dispense Refill  . acetaminophen (TYLENOL) 325 MG tablet Take 650 mg by mouth as needed.    [name] Kitchen amLODipine (NORVASC) 10 MG tablet TAKE ONE TABLET BY MOUTH ONCE DAILY 30 tablet 0  . atorvastatin (LIPITOR) 80 MG tablet TAKE ONE TABLET BY MOUTH ONCE DAILY AT 6 PM 30 tablet 5  . diphenhydrAMINE (BENADRYL) 25 mg capsule Take 25 mg by mouth as needed.    . metoprolol succinate (TOPROL-XL) 25 MG 24 hr tablet TAKE ONE TABLET BY MOUTH ONCE DAILY 30 tablet 0  . potassium chloride SA (K-DUR,KLOR-CON) 20 MEQ tablet Take 1 tablet (20 mEq total) by mouth daily. 30 tablet 0  . warfarin (COUMADIN) 5 MG tablet TAKE AS DIRECTED BY  COUMADIN  CLINIC 40 tablet 3   No current facility-administered  medications on file prior to visit.    Pulse 77  Temp(Src) 98.7 F (37.1 C) (Oral)  Resp 18  Ht 5' 2.5" (1.588 m)  Wt 180 lb 6.4 oz (81.829 kg)  BMI 32.45 kg/m2  SpO2 100%       Objective:   Physical Exam Physical Exam  Constitutional: She is oriented to person, place, and time. She appears well-developed and well-nourished. No distress.  HENT:  Head: Normocephalic and atraumatic.  Right Ear: Tympanic membrane and ear canal normal.  Left Ear: Tympanic membrane and ear canal normal.  Mouth/Throat: Oropharynx is clear and moist.  Eyes: Pupils are equal, round, and reactive to light. No scleral icterus.  Neck: Normal range of motion. No thyromegaly present.  Cardiovascular: Normal rate and regular rhythm.   No murmur heard. Pulmonary/Chest: Effort normal and breath sounds normal. No respiratory distress. He has no wheezes. She has no rales. She exhibits no tenderness.  Abdominal: Soft. Bowel sounds are normal. He exhibits no distension and no mass. There is no tenderness. There is no rebound and no guarding.  Musculoskeletal: She exhibits no edema.  Lymphadenopathy:    She has no cervical adenopathy.  Neurological: She is alert and oriented to person, place, and time. She has normal reflexes. She exhibits normal muscle tone. Coordination normal.  Skin: Skin is warm and dry.  Psychiatric: She has a normal mood and affect. Her behavior is normal. Judgment and thought content normal.  Breasts: Examined lying Right: Without masses, retractions, discharge or axillary adenopathy.  Left: palpable mass left breast at 11 oclock  Inguinal/mons: Normal without inguinal adenopathy  External genitalia: Normal  BUS/Urethra/Skene's glands: Normal  Bladder: Normal  Vagina: Normal  Cervix: Normal  Uterus: normal in size, shape and contour. Midline and mobile- 1st degress uterine prolapse Adnexa/parametria:  Rt: Without masses or tenderness.  Lt: Without masses or tenderness.  Anus and  perineum: Normal           Assessment & Plan:          Assessment & Plan:

## 2015-07-22 NOTE — Progress Notes (Signed)
Pre visit review using our clinic review tool, if applicable. No additional management support is needed unless otherwise documented below in the visit note. 

## 2015-07-22 NOTE — Assessment & Plan Note (Signed)
She is interested in removal. I did review with [name] and his office will arrange follow up with [name].

## 2015-07-22 NOTE — Patient Instructions (Addendum)
Please schedule follow up eye exam and dental exam. Schedule mammogram on the first floor. We will contact you about your appointment for colonoscopy and bone density. Try to add regular exercise such as walking. Goal is to work toward 30 minutes 5 days a week.

## 2015-07-22 NOTE — Telephone Encounter (Signed)
See mychart.  

## 2015-07-22 NOTE — Assessment & Plan Note (Signed)
Discussed healthy diet, exercise.  Refer for colo, dexa, mammo (diagnositic due to breast mass- it is possible that the mass is the loop recorder that I am palpating). Advised pt to arrange vision/dental follow up.  She has a uterine prolapse. It is not causing her any discomfort. Monitor.

## 2015-07-23 ENCOUNTER — Ambulatory Visit (INDEPENDENT_AMBULATORY_CARE_PROVIDER_SITE_OTHER): Payer: BC Managed Care – PPO | Admitting: *Deleted

## 2015-07-23 DIAGNOSIS — I480 Paroxysmal atrial fibrillation: Secondary | ICD-10-CM

## 2015-07-23 DIAGNOSIS — I4891 Unspecified atrial fibrillation: Secondary | ICD-10-CM | POA: Diagnosis not present

## 2015-07-23 DIAGNOSIS — I6310 Cerebral infarction due to embolism of unspecified precerebral artery: Secondary | ICD-10-CM | POA: Diagnosis not present

## 2015-07-23 DIAGNOSIS — Z5181 Encounter for therapeutic drug level monitoring: Secondary | ICD-10-CM

## 2015-07-23 LAB — HEPATIC FUNCTION PANEL
ALBUMIN: 4 g/dL (ref 3.5–5.2)
ALT: 23 U/L (ref 0–35)
AST: 31 U/L (ref 0–37)
Alkaline Phosphatase: 82 U/L (ref 39–117)
Bilirubin, Direct: 0.1 mg/dL (ref 0.0–0.3)
TOTAL PROTEIN: 8.7 g/dL — AB (ref 6.0–8.3)
Total Bilirubin: 0.6 mg/dL (ref 0.2–1.2)

## 2015-07-23 LAB — LIPID PANEL
CHOL/HDL RATIO: 2
Cholesterol: 164 mg/dL (ref 0–200)
HDL: 68.9 mg/dL (ref >39.00)
LDL CALC: 77 mg/dL (ref 0–99)
NonHDL: 94.72
Triglycerides: 91 mg/dL (ref 0.0–149.0)
VLDL: 18.2 mg/dL (ref 0.0–40.0)

## 2015-07-23 LAB — CBC WITH DIFFERENTIAL/PLATELET
BASOS PCT: 0.1 % (ref 0.0–3.0)
Basophils Absolute: 0 10*3/uL (ref 0.0–0.1)
Eosinophils Absolute: 0.2 10*3/uL (ref 0.0–0.7)
Eosinophils Relative: 3.1 % (ref 0.0–5.0)
HCT: 38.5 % (ref 36.0–46.0)
Hemoglobin: 12.4 g/dL (ref 12.0–15.0)
LYMPHS ABS: 1.1 10*3/uL (ref 0.7–4.0)
Lymphocytes Relative: 22.9 % (ref 12.0–46.0)
MCHC: 32.3 g/dL (ref 30.0–36.0)
MCV: 84.8 fl (ref 78.0–100.0)
MONO ABS: 0.6 10*3/uL (ref 0.1–1.0)
Monocytes Relative: 11.9 % (ref 3.0–12.0)
Neutro Abs: 3.1 10*3/uL (ref 1.4–7.7)
Neutrophils Relative %: 62 % (ref 43.0–77.0)
Platelets: 131 10*3/uL — ABNORMAL LOW (ref 150.0–400.0)
RBC: 4.55 Mil/uL (ref 3.87–5.11)
RDW: 16.1 % — AB (ref 11.5–15.5)
WBC: 4.9 10*3/uL (ref 4.0–10.5)

## 2015-07-23 LAB — BASIC METABOLIC PANEL
BUN: 21 mg/dL (ref 6–23)
CHLORIDE: 107 meq/L (ref 96–112)
CO2: 26 mEq/L (ref 19–32)
CREATININE: 0.84 mg/dL (ref 0.40–1.20)
Calcium: 9.7 mg/dL (ref 8.4–10.5)
GFR: 87.91 mL/min (ref >60.00)
GLUCOSE: 83 mg/dL (ref 70–99)
Potassium: 4.4 mEq/L (ref 3.5–5.1)
Sodium: 139 mEq/L (ref 135–145)

## 2015-07-23 LAB — URINALYSIS, ROUTINE W REFLEX MICROSCOPIC
Bilirubin Urine: NEGATIVE
Ketones, ur: NEGATIVE
Leukocytes, UA: NEGATIVE
NITRITE: NEGATIVE
RBC / HPF: NONE SEEN (ref 0–?)
Specific Gravity, Urine: 1.025 (ref 1.000–1.030)
Total Protein, Urine: NEGATIVE
Urine Glucose: NEGATIVE
Urobilinogen, UA: 0.2 (ref 0.0–1.0)
WBC UA: NONE SEEN (ref 0–?)
pH: 5.5 (ref 5.0–8.0)

## 2015-07-23 LAB — POCT INR: INR: 2.4

## 2015-07-23 LAB — TSH: TSH: 1.48 u[IU]/mL (ref 0.35–4.50)

## 2015-07-23 MED ORDER — WARFARIN SODIUM 5 MG PO TABS: ORAL_TABLET | ORAL | Status: DC

## 2015-07-23 NOTE — Addendum Note (Signed)
Addended by: Harl Bowie on: 07/23/2015 03:23 PM   Modules accepted: Orders

## 2015-07-24 LAB — CYTOLOGY - PAP

## 2015-07-24 NOTE — Addendum Note (Signed)
Addended by: Debbrah Alar on: 07/24/2015 09:29 PM   Modules accepted: Orders, SmartSet

## 2015-07-25 ENCOUNTER — Encounter: Payer: Self-pay | Admitting: Family

## 2015-07-27 LAB — CUP PACEART REMOTE DEVICE CHECK: Date Time Interrogation Session: 20170128033820

## 2015-07-27 NOTE — Progress Notes (Signed)
Carelink summary report received. Battery status OK. Normal device function. No new symptom episodes, tachy episodes, brady, or pause episodes. 37 AF episodes 0.2% burden +Warfarin. Monthly summary reports and ROV/PRN

## 2015-08-06 ENCOUNTER — Ambulatory Visit (INDEPENDENT_AMBULATORY_CARE_PROVIDER_SITE_OTHER): Payer: BC Managed Care – PPO | Admitting: *Deleted

## 2015-08-06 DIAGNOSIS — I6310 Cerebral infarction due to embolism of unspecified precerebral artery: Secondary | ICD-10-CM | POA: Diagnosis not present

## 2015-08-06 NOTE — Progress Notes (Signed)
Carelink Summary Report / Loop Recorder 

## 2015-08-12 ENCOUNTER — Encounter: Payer: BC Managed Care – PPO | Admitting: Family

## 2015-08-22 ENCOUNTER — Other Ambulatory Visit: Payer: Self-pay | Admitting: Family

## 2015-09-03 ENCOUNTER — Ambulatory Visit (INDEPENDENT_AMBULATORY_CARE_PROVIDER_SITE_OTHER): Payer: BC Managed Care – PPO | Admitting: *Deleted

## 2015-09-03 DIAGNOSIS — Z5181 Encounter for therapeutic drug level monitoring: Secondary | ICD-10-CM

## 2015-09-03 DIAGNOSIS — I6310 Cerebral infarction due to embolism of unspecified precerebral artery: Secondary | ICD-10-CM

## 2015-09-03 DIAGNOSIS — I480 Paroxysmal atrial fibrillation: Secondary | ICD-10-CM | POA: Diagnosis not present

## 2015-09-03 DIAGNOSIS — I4891 Unspecified atrial fibrillation: Secondary | ICD-10-CM | POA: Diagnosis not present

## 2015-09-03 LAB — POCT INR: INR: 2

## 2015-09-05 ENCOUNTER — Ambulatory Visit (INDEPENDENT_AMBULATORY_CARE_PROVIDER_SITE_OTHER): Payer: BC Managed Care – PPO | Admitting: *Deleted

## 2015-09-05 DIAGNOSIS — I6310 Cerebral infarction due to embolism of unspecified precerebral artery: Secondary | ICD-10-CM

## 2015-09-05 NOTE — Progress Notes (Signed)
Carelink Summary Report / Loop Recorder 

## 2015-09-30 LAB — CUP PACEART REMOTE DEVICE CHECK: MDC IDC SESS DTM: 20170227040727

## 2015-09-30 NOTE — Progress Notes (Signed)
Carelink summary report received. Battery status OK. Normal device function. No new symptom episodes, tachy episodes, brady, or pause episodes. 56 AF episodes- 3.8% AF burden. 1 with ECG appears true. +Warfarin. Monthly summary reports and ROV/PRN

## 2015-10-04 LAB — CUP PACEART REMOTE DEVICE CHECK: MDC IDC SESS DTM: 20170329133955

## 2015-10-04 NOTE — Progress Notes (Signed)
Carelink summary report received. Battery status OK. Normal device function. No new symptom episodes, tachy episodes, brady, or pause episodes. 2 AF episodes, 0% burden, +Warfarin. Monthly summary reports and ROV/PRN

## 2015-10-07 ENCOUNTER — Encounter: Payer: Self-pay | Admitting: Medical

## 2015-10-07 ENCOUNTER — Ambulatory Visit (INDEPENDENT_AMBULATORY_CARE_PROVIDER_SITE_OTHER): Payer: BC Managed Care – PPO | Admitting: Medical

## 2015-10-07 ENCOUNTER — Ambulatory Visit (INDEPENDENT_AMBULATORY_CARE_PROVIDER_SITE_OTHER): Payer: BC Managed Care – PPO | Admitting: *Deleted

## 2015-10-07 ENCOUNTER — Ambulatory Visit (HOSPITAL_BASED_OUTPATIENT_CLINIC_OR_DEPARTMENT_OTHER)
Admission: RE | Admit: 2015-10-07 | Discharge: 2015-10-07 | Disposition: A | Discharge status: Home / Self Care | Payer: BC Managed Care – PPO | Source: Ambulatory Visit | Attending: Medical | Admitting: Medical

## 2015-10-07 VITALS — BP 119/86 | HR 94 | Temp 100.6°F | Ht 62.5 in | Wt 176.6 lb

## 2015-10-07 DIAGNOSIS — R05 Cough: Secondary | ICD-10-CM

## 2015-10-07 DIAGNOSIS — J309 Allergic rhinitis, unspecified: Secondary | ICD-10-CM

## 2015-10-07 DIAGNOSIS — R059 Cough, unspecified: Secondary | ICD-10-CM

## 2015-10-07 DIAGNOSIS — R195 Other fecal abnormalities: Secondary | ICD-10-CM | POA: Diagnosis not present

## 2015-10-07 DIAGNOSIS — R509 Fever, unspecified: Secondary | ICD-10-CM

## 2015-10-07 DIAGNOSIS — R5383 Other fatigue: Secondary | ICD-10-CM | POA: Diagnosis not present

## 2015-10-07 DIAGNOSIS — I6310 Cerebral infarction due to embolism of unspecified precerebral artery: Secondary | ICD-10-CM

## 2015-10-07 DIAGNOSIS — I517 Cardiomegaly: Secondary | ICD-10-CM | POA: Insufficient documentation

## 2015-10-07 LAB — COMPREHENSIVE METABOLIC PANEL
ALBUMIN: 3.6 g/dL (ref 3.6–5.1)
ALT: 22 U/L (ref 6–29)
AST: 41 U/L — ABNORMAL HIGH (ref 10–35)
Alkaline Phosphatase: 59 U/L (ref 33–130)
BILIRUBIN TOTAL: 1 mg/dL (ref 0.2–1.2)
BUN: 21 mg/dL (ref 7–25)
CALCIUM: 8.7 mg/dL (ref 8.6–10.4)
CHLORIDE: 102 mmol/L (ref 98–110)
CO2: 21 mmol/L (ref 20–31)
Creat: 1.37 mg/dL — ABNORMAL HIGH (ref 0.50–0.99)
Glucose, Bld: 88 mg/dL (ref 65–99)
POTASSIUM: 4.6 mmol/L (ref 3.5–5.3)
Sodium: 134 mmol/L — ABNORMAL LOW (ref 135–146)
Total Protein: 8.6 g/dL — ABNORMAL HIGH (ref 6.1–8.1)

## 2015-10-07 LAB — CBC WITH DIFFERENTIAL/PLATELET
BASOS ABS: 0 {cells}/uL (ref 0–200)
Basophils Relative: 0 %
Eosinophils Absolute: 0 cells/uL — ABNORMAL LOW (ref 15–500)
Eosinophils Relative: 0 %
HEMATOCRIT: 36.2 % (ref 35.0–45.0)
HEMOGLOBIN: 11.7 g/dL (ref 11.7–15.5)
LYMPHS ABS: 1615 {cells}/uL (ref 850–3900)
Lymphocytes Relative: 19 %
MCH: 27.1 pg (ref 27.0–33.0)
MCHC: 32.3 g/dL (ref 32.0–36.0)
MCV: 84 fL (ref 80.0–100.0)
MONO ABS: 935 {cells}/uL (ref 200–950)
Monocytes Relative: 11 %
NEUTROS PCT: 70 %
Neutro Abs: 5950 cells/uL (ref 1500–7800)
Platelets: 132 10*3/uL — ABNORMAL LOW (ref 140–400)
RBC: 4.31 MIL/uL (ref 3.80–5.10)
RDW: 14.3 % (ref 11.0–15.0)
WBC: 8.5 10*3/uL (ref 3.8–10.8)

## 2015-10-07 MED ORDER — LEVOCETIRIZINE DIHYDROCHLORIDE 5 MG PO TABS: 5.0000 mg | ORAL_TABLET | Freq: Every evening | ORAL | Status: DC

## 2015-10-07 MED ORDER — CEPHALEXIN 500 MG PO CAPS: 500.0000 mg | ORAL_CAPSULE | Freq: Two times a day (BID) | ORAL | Status: DC

## 2015-10-07 NOTE — Progress Notes (Signed)
Pre visit review using our clinic review tool, if applicable. No additional management support is needed unless otherwise documented below in the visit note. 

## 2015-10-07 NOTE — Progress Notes (Signed)
Subjective:    Patient ID: Marie Malone, female    DOB: Nov 29, 1950, 65 y.o.   MRN: [id number]  HPI  Pt in with some nasal congestion with some occasional pnd. Pt feels congested for about 1 wk. Decreased smell and taste. Pt states no mucous when blows nose. Non productive cough. Some sneezing.   Friday and Saturday had loose stools. Since Saturday that has gotten better. But some fatigue since then. Pt appetite decreased.  Pt states on Friday and Saturday maybe 7-8 loose stools.None since Monday. She is not wanting to eat much decreased appetite.   Fever today of 100.6. Today.    Review of Systems  Constitutional: Positive for fever and fatigue. Negative for chills.  HENT: Positive for congestion and postnasal drip. Negative for rhinorrhea, sinus pressure, sneezing and sore throat.   Respiratory: Positive for cough. Negative for choking, shortness of breath and wheezing.   Cardiovascular: Negative for chest pain and palpitations.  Gastrointestinal: Negative for abdominal pain.  Musculoskeletal: Negative for back pain.  Skin: Negative for pallor and rash.  Neurological: Negative for dizziness and headaches.  Hematological: Negative for adenopathy. Does not bruise/bleed easily.  Psychiatric/Behavioral: Negative for behavioral problems and confusion.    Past Medical History  Diagnosis Date  . Hypertension   . Stroke ([place])   . History of rheumatic fever     as a child  . Hyperlipidemia   . Mitral stenosis   . OSA (obstructive sleep apnea)     mild per sleep study 2008  . Heart murmur   . Complication of anesthesia     difficult to wake up from anesthesia  . Pneumonia     double pneumonia once  . Atrial fibrillation [facility])      Social History   Social History  . Marital Status: Married    Spouse Name: N/A  . Number of Children: 2  . Years of Education: Associates   Occupational History  . Not on file.   Social History Main Topics  . Smoking status: Former  Smoker -- 1.00 packs/day for 5 years    Types: Cigarettes    Start date: 07/15/1970    Quit date: 05/11/1975  . Smokeless tobacco: Never Used     Comment: quit smoking 36 years ago  . Alcohol Use: No     Comment: drank years ago when young  . Drug Use: No  . Sexual Activity: Not on file   Other Topics Concern  . 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 Cardiac surgery  2008    "balloon surgery at [facility]"  . Abdominal hysterectomy    . Colonoscopy w/ polypectomy    . Appendectomy  1989    appendix ruptured,had peritonitis  . Tee without cardioversion N/A 04/10/2013    Procedure: TRANSESOPHAGEAL ECHOCARDIOGRAM (TEE);  Surgeon: [name], MD;  Location: [facility] ENDOSCOPY;  Service: Cardiovascular;  Laterality: N/A;  . Loop recorder implant  04-10-2013    MDT LinQ implanted by Dr Rayann Heman for cryptogenic stroke  . Loop recorder implant N/A 04/10/2013    Procedure: LOOP RECORDER IMPLANT;  Surgeon: Coralyn Mark, MD;  Location: [place] CATH LAB;  Service: Cardiovascular;  Laterality: N/A;  Family History  Problem Relation Age of Onset  . Diabetes Mother   . Stroke Mother   . Diabetes Father   . Heart disease Father     CHF  . Cancer Father     kidney  . Diabetes Sister   . Diabetes Brother   . Hypertension Daughter     No Known Allergies  Current Outpatient Prescriptions on File Prior to Visit  Medication Sig Dispense Refill  . acetaminophen (TYLENOL) 325 MG tablet Take 650 mg by mouth as needed.    [name] Kitchen amLODipine (NORVASC) 10 MG tablet Take 1 tablet (10 mg total) by mouth daily. 30 tablet 5  . atorvastatin (LIPITOR) 80 MG tablet TAKE ONE TABLET BY MOUTH ONCE DAILY AT 6 PM 30 tablet 5  . diphenhydrAMINE (BENADRYL) 25 mg capsule Take 25 mg by mouth as needed.    .  metoprolol succinate (TOPROL-XL) 25 MG 24 hr tablet TAKE ONE TABLET BY MOUTH ONCE DAILY 30 tablet 5  . potassium chloride SA (K-DUR,KLOR-CON) 20 MEQ tablet Take 1 tablet (20 mEq total) by mouth daily. 30 tablet 5  . warfarin (COUMADIN) 5 MG tablet Take as directed by coumadin clinic 45 tablet 3   No current facility-administered medications on file prior to visit.    BP 119/86 mmHg  Pulse 94  Temp(Src) 100.6 F (38.1 C) (Oral)  Ht 5' 2.5" (1.588 m)  Wt 176 lb 9.6 oz (80.105 kg)  BMI 31.77 kg/m2       Objective:   Physical Exam  General  Mental Status - Alert. General Appearance - Well groomed. Not in acute distress.  Skin Rashes- No Rashes.  HEENT Head- Normal. Ear Auditory Canal - Left- Normal. Right - Normal.Tympanic Membrane- Left- Normal. Right- Normal. Eye Sclera/Conjunctiva- Left- Normal. Right- Normal. Nose & Sinuses Nasal Mucosa- Left-  Boggy and Congested. Right-  Boggy and  Congested.Bilateral  No maxillary and no  frontal sinus pressure. Mouth & Throat Lips: Upper Lip- Normal: no dryness, cracking, pallor, cyanosis, or vesicular eruption. Lower Lip-Normal: no dryness, cracking, pallor, cyanosis or vesicular eruption. Buccal Mucosa- Bilateral- No Aphthous ulcers. Oropharynx- No Discharge or Erythema. +pnd. Tonsils: Characteristics- Bilateral- No Erythema or Congestion. Size/Enlargement- Bilateral- No enlargement. Discharge- bilateral-None.  Neck Neck- Supple. No Masses.   Chest and Lung Exam Auscultation: Breath Sounds:-Clear even and unlabored.  Cardiovascular Auscultation:Rythm- Regular, rate and rhythm. Murmurs & Other Heart Sounds:Ausculatation of the heart reveal- No Murmurs.  Lymphatic Head & Neck General Head & Neck Lymphatics: Bilateral: Description- No Localized lymphadenopathy.       Assessment & Plan:  For allergies rx flonase and xyzal.  For fever, fatigue and cough will get cbc, cmp and cxr. If fever worsening with produuctive  cough then start cephalexin. Otherwise advised wait on advise on taking antibiotic after labs reviewed.  For fever take tylenol   If any recurrent loose stools then would need to get stool panel kit. I think you had viral gi illness that is now resolved.  You may have some mild dehydration related to loose stools. Recommend propel hydration and eat bland foods.  Follow up in 7 days or as needed

## 2015-10-07 NOTE — Patient Instructions (Addendum)
For allergies rx flonase and xyzal.  For fever, fatigue and cough will get cbc, cmp and cxr. If fever worsening with produuctive cough then start cephalexin. Otherwise advised wait on advise on taking antibiotic after labs reviewed.  For fever take tylenol   If any recurrent loose stools then would need to get stool panel kit. I think you had viral gi illness that is now resolved.  You may have some mild dehydration related to loose stools. Recommend propel hydration and eat bland foods.  Follow up in 7 days or as needed

## 2015-10-08 DIAGNOSIS — I517 Cardiomegaly: Secondary | ICD-10-CM

## 2015-10-08 DIAGNOSIS — R0989 Other specified symptoms and signs involving the circulatory and respiratory systems: Secondary | ICD-10-CM

## 2015-10-08 NOTE — Progress Notes (Signed)
Quick Note:  Pt has seen results on MyChart and message also sent for patient to call back if any questions. ______ 

## 2015-10-08 NOTE — Telephone Encounter (Signed)
bnp order placed.

## 2015-10-08 NOTE — Progress Notes (Signed)
Carelink Summary Report / Loop Recorder 

## 2015-10-09 NOTE — Telephone Encounter (Signed)
Spoke with pt and she was concerned why the doctor has ordered repeat lab work and I advised her that per E. Saguier he wanted her to have routine blood work. Pt did not have any further questions.

## 2015-10-09 NOTE — Telephone Encounter (Signed)
Patient would like to speak with CMA directly on the phone regarding why she has to come in for labs. Please advise best [phone] (M)

## 2015-10-10 ENCOUNTER — Other Ambulatory Visit (INDEPENDENT_AMBULATORY_CARE_PROVIDER_SITE_OTHER): Payer: BC Managed Care – PPO

## 2015-10-10 DIAGNOSIS — R0989 Other specified symptoms and signs involving the circulatory and respiratory systems: Secondary | ICD-10-CM | POA: Diagnosis not present

## 2015-10-10 DIAGNOSIS — I517 Cardiomegaly: Secondary | ICD-10-CM | POA: Diagnosis not present

## 2015-10-10 LAB — BRAIN NATRIURETIC PEPTIDE: PRO B NATRI PEPTIDE: 238 pg/mL — AB (ref 0.0–100.0)

## 2015-10-15 ENCOUNTER — Ambulatory Visit (INDEPENDENT_AMBULATORY_CARE_PROVIDER_SITE_OTHER): Payer: BC Managed Care – PPO | Admitting: *Deleted

## 2015-10-15 DIAGNOSIS — I4891 Unspecified atrial fibrillation: Secondary | ICD-10-CM

## 2015-10-15 DIAGNOSIS — I480 Paroxysmal atrial fibrillation: Secondary | ICD-10-CM

## 2015-10-15 DIAGNOSIS — Z5181 Encounter for therapeutic drug level monitoring: Secondary | ICD-10-CM

## 2015-10-15 DIAGNOSIS — I6310 Cerebral infarction due to embolism of unspecified precerebral artery: Secondary | ICD-10-CM | POA: Diagnosis not present

## 2015-10-15 LAB — PROTIME-INR
INR: 5.91 (ref 0.00–1.49)
PROTHROMBIN TIME: 50.9 s — AB (ref 11.6–15.2)

## 2015-10-15 LAB — POCT INR

## 2015-10-17 LAB — CUP PACEART REMOTE DEVICE CHECK: MDC IDC SESS DTM: 20170428043652

## 2015-10-24 ENCOUNTER — Ambulatory Visit (INDEPENDENT_AMBULATORY_CARE_PROVIDER_SITE_OTHER): Payer: BC Managed Care – PPO | Admitting: *Deleted

## 2015-10-24 DIAGNOSIS — Z5181 Encounter for therapeutic drug level monitoring: Secondary | ICD-10-CM

## 2015-10-24 DIAGNOSIS — I6310 Cerebral infarction due to embolism of unspecified precerebral artery: Secondary | ICD-10-CM | POA: Diagnosis not present

## 2015-10-24 DIAGNOSIS — I4891 Unspecified atrial fibrillation: Secondary | ICD-10-CM | POA: Diagnosis not present

## 2015-10-24 DIAGNOSIS — I480 Paroxysmal atrial fibrillation: Secondary | ICD-10-CM | POA: Diagnosis not present

## 2015-10-24 LAB — POCT INR: INR: 3.4

## 2015-10-28 ENCOUNTER — Ambulatory Visit (HOSPITAL_BASED_OUTPATIENT_CLINIC_OR_DEPARTMENT_OTHER)
Admission: RE | Admit: 2015-10-28 | Discharge: 2015-10-28 | Disposition: A | Discharge status: Home / Self Care | Payer: BC Managed Care – PPO | Source: Ambulatory Visit | Attending: Family | Admitting: Family

## 2015-10-28 DIAGNOSIS — Z780 Asymptomatic menopausal state: Secondary | ICD-10-CM | POA: Diagnosis not present

## 2015-10-28 DIAGNOSIS — Z13820 Encounter for screening for osteoporosis: Secondary | ICD-10-CM | POA: Diagnosis not present

## 2015-10-28 DIAGNOSIS — Z0000 Encounter for general adult medical examination without abnormal findings: Secondary | ICD-10-CM | POA: Insufficient documentation

## 2015-10-28 DIAGNOSIS — Z87891 Personal history of nicotine dependence: Secondary | ICD-10-CM | POA: Insufficient documentation

## 2015-11-04 ENCOUNTER — Ambulatory Visit (INDEPENDENT_AMBULATORY_CARE_PROVIDER_SITE_OTHER): Payer: BC Managed Care – PPO | Admitting: *Deleted

## 2015-11-04 ENCOUNTER — Ambulatory Visit (INDEPENDENT_AMBULATORY_CARE_PROVIDER_SITE_OTHER): Payer: BC Managed Care – PPO | Admitting: Pharmacist

## 2015-11-04 DIAGNOSIS — I6310 Cerebral infarction due to embolism of unspecified precerebral artery: Secondary | ICD-10-CM | POA: Diagnosis not present

## 2015-11-04 DIAGNOSIS — Z5181 Encounter for therapeutic drug level monitoring: Secondary | ICD-10-CM | POA: Diagnosis not present

## 2015-11-04 DIAGNOSIS — I480 Paroxysmal atrial fibrillation: Secondary | ICD-10-CM

## 2015-11-04 DIAGNOSIS — I4891 Unspecified atrial fibrillation: Secondary | ICD-10-CM | POA: Diagnosis not present

## 2015-11-04 LAB — POCT INR: INR: 2.9

## 2015-11-04 NOTE — Progress Notes (Signed)
Carelink Summary Report / Loop Recorder 

## 2015-11-13 LAB — CUP PACEART REMOTE DEVICE CHECK
Date Time Interrogation Session: 20170528050743
MDC IDC SESS DTM: 20170627050740

## 2015-12-01 ENCOUNTER — Other Ambulatory Visit: Payer: Self-pay | Admitting: Cardiology

## 2015-12-01 ENCOUNTER — Encounter: Payer: Self-pay | Admitting: Cardiology

## 2015-12-02 ENCOUNTER — Ambulatory Visit (INDEPENDENT_AMBULATORY_CARE_PROVIDER_SITE_OTHER): Payer: BC Managed Care – PPO | Admitting: *Deleted

## 2015-12-02 DIAGNOSIS — Z5181 Encounter for therapeutic drug level monitoring: Secondary | ICD-10-CM | POA: Diagnosis not present

## 2015-12-02 DIAGNOSIS — I4891 Unspecified atrial fibrillation: Secondary | ICD-10-CM

## 2015-12-02 DIAGNOSIS — I6310 Cerebral infarction due to embolism of unspecified precerebral artery: Secondary | ICD-10-CM | POA: Diagnosis not present

## 2015-12-02 LAB — POCT INR: INR: 2.8

## 2015-12-04 ENCOUNTER — Ambulatory Visit (INDEPENDENT_AMBULATORY_CARE_PROVIDER_SITE_OTHER): Payer: BC Managed Care – PPO | Admitting: *Deleted

## 2015-12-04 DIAGNOSIS — I6310 Cerebral infarction due to embolism of unspecified precerebral artery: Secondary | ICD-10-CM

## 2015-12-04 NOTE — Progress Notes (Signed)
Carelink Summary Report / Loop Recorder 

## 2015-12-10 NOTE — Progress Notes (Signed)
HPI: FU MS and atrial fibrillation. Patient has a history of rheumatic fever. Cardiac catheterization in [facility] in June of 2008 showed normal LV function, no coronary disease, moderate mitral stenosis with a valve area of 1.1 cm, and severe pulmonary hypertension. TEE in [facility] in 2008 showed normal LV function and moderate mitral stenosis with a valve area of 1.4 cm, mild MR. Patient had valvuloplasty at [facility] in 2008. Nuclear study in April of 2014 showed an ejection fraction of 56%. There was a fixed anterior defect consistent with soft tissue attenuation but no ischemia. Abdominal ultrasound in April of 2014 normal. Patient was admitted 11/14 with a CVA. Transesophageal echocardiogram 12/14 showed normal LV function. There was mild mitral stenosis and mild mitral regurgitation. The left atrium was dilated and there was spontaneous contrast in the left atrial appendage but no thrombus. There was mild spontaneous contrast in the right atrium. There was a small oscillating density on the anterior mitral valve leaflet of uncertain etiology and vegetation could not be excluded. There was a small oscillating density on the aortic valve felt most likely to be Lambl's excrescence. Patient was treated for SBE; blood cultures negative. Patient did have an implantable loop placed prior to discharge. This ultimately revealed paroxysmal atrial fibrillation and anticoagulation was initiated. Carotid Dopplers February 2016 showed 50-60% right stenosis. Last echocardiogram August 2016 showed normal LV systolic function, mild mitral stenosis and regurgitation, mitral valve area 1.7 cm2, biatrial enlargement. Since last seen, she denies dyspnea, chest pain, palpitations or syncope. No bleeding. Occasional mild pedal edema.   Current Outpatient Prescriptions  Medication Sig Dispense Refill  . acetaminophen (TYLENOL) 325 MG tablet Take 650 mg by mouth as needed.    [name] Kitchen amLODipine (NORVASC) 10 MG tablet Take 1  tablet (10 mg total) by mouth daily. 30 tablet 5  . atorvastatin (LIPITOR) 80 MG tablet TAKE ONE TABLET BY MOUTH ONCE DAILY AT 6 PM 30 tablet 5  . diphenhydrAMINE (BENADRYL) 25 mg capsule Take 25 mg by mouth as needed.    . metoprolol succinate (TOPROL-XL) 25 MG 24 hr tablet TAKE ONE TABLET BY MOUTH ONCE DAILY 30 tablet 5  . potassium chloride SA (K-DUR,KLOR-CON) 20 MEQ tablet Take 1 tablet (20 mEq total) by mouth daily. 30 tablet 5  . warfarin (COUMADIN) 5 MG tablet TAKE AS DIRECTED BY  COUMADIN  CLINIC 45 tablet 0   No current facility-administered medications for this visit.      Past Medical History:  Diagnosis Date  . Atrial fibrillation ([place])   . Complication of anesthesia    difficult to wake up from anesthesia  . Heart murmur   . History of rheumatic fever    as a child  . Hyperlipidemia   . Hypertension   . Mitral stenosis   . OSA (obstructive sleep apnea)    mild per sleep study 2008  . Pneumonia    double pneumonia once  . Stroke [facility])     Past Surgical History:  Procedure Laterality Date  . ABDOMINAL HYSTERECTOMY    . APPENDECTOMY  1989   appendix ruptured,had peritonitis  . CARDIAC SURGERY  2008   "balloon surgery at [facility]"  . COLONOSCOPY W/ POLYPECTOMY    . 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 LOOP RECORDER IMPLANT N/A 04/10/2013   Procedure: LOOP RECORDER IMPLANT;  Surgeon: Coralyn Mark, MD;  Location: [place] CATH LAB;  Service:  Cardiovascular;  Laterality: N/A;  . TEE WITHOUT CARDIOVERSION N/A 04/10/2013   Procedure: TRANSESOPHAGEAL ECHOCARDIOGRAM (TEE);  Surgeon: [name], MD;  Location: [facility] ENDOSCOPY;  Service: Cardiovascular;  Laterality: N/A;    Social History   Social History  . Marital status: Married    Spouse name: N/A  . Number of children: 2  . Years of education: Associates   Occupational History  . Not on file.   Social History Main Topics  . Smoking status: Former Smoker     Packs/day: 1.00    Years: 5.00    Types: Cigarettes    Start date: 07/15/1970    Quit date: 05/11/1975  . Smokeless tobacco: Never Used     Comment: quit smoking 36 years ago  . Alcohol use No     Comment: drank years ago when young  . Drug use: No  . Sexual activity: Not on file   Other Topics Concern  . 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 Diabetes Mother   . Stroke Mother   . Diabetes Father   . Heart disease Father     CHF  . Cancer Father     kidney  . Diabetes Sister   . Diabetes Brother   . Hypertension Daughter     ROS: no fevers or chills, productive cough, hemoptysis, dysphasia, odynophagia, melena, hematochezia, dysuria, hematuria, rash, seizure activity, orthopnea, PND, pedal edema, claudication. Remaining systems are negative.  Physical Exam: Well-developed well-nourished in no acute distress.  Skin is warm and dry.  HEENT is normal.  Neck is supple.  Chest is clear to auscultation with normal expansion.  Cardiovascular exam is irregular, 2/6 systolic murmur LSB Abdominal exam nontender or distended. No masses palpated. Extremities show no edema. neuro grossly intact  ECG Atrial flutter at a rate of 64. Low voltage.  A/P  1 Paroxysmal atrial fibrillation/flutter-patient is in atrial flutter today. Her rate is controlled.Continue metoprolol at present dose. Continue Coumadin.  2 history of implantable loop recorder-we will have [name] see patient back for removal.  3 hypertension-blood pressure controlled. Continue present medications.  4 history of mitral stenosis/balloon valvuloplasty-continue SBE prophylaxis. Repeat echocardiogram.  5 carotid artery disease-continue statin. No aspirin given need for anticoagulation. Schedule follow-up  carotid Dopplers.  6 hyperlipidemia-Continue statin. Lipids and liver monitored by primary care.  [name], MD

## 2015-12-18 ENCOUNTER — Encounter: Payer: Self-pay | Admitting: Cardiology

## 2015-12-18 ENCOUNTER — Ambulatory Visit (INDEPENDENT_AMBULATORY_CARE_PROVIDER_SITE_OTHER): Payer: BC Managed Care – PPO | Admitting: Cardiology

## 2015-12-18 VITALS — BP 128/74 | HR 64 | Ht 62.0 in | Wt 176.0 lb

## 2015-12-18 DIAGNOSIS — E785 Hyperlipidemia, unspecified: Secondary | ICD-10-CM | POA: Diagnosis not present

## 2015-12-18 DIAGNOSIS — I059 Rheumatic mitral valve disease, unspecified: Secondary | ICD-10-CM | POA: Diagnosis not present

## 2015-12-18 DIAGNOSIS — I480 Paroxysmal atrial fibrillation: Secondary | ICD-10-CM

## 2015-12-18 DIAGNOSIS — I10 Essential (primary) hypertension: Secondary | ICD-10-CM

## 2015-12-18 NOTE — Patient Instructions (Signed)
Procedures  Your physician has requested that you have a carotid duplex. This test is an ultrasound of the carotid arteries in your neck. It looks at blood flow through these arteries that supply the brain with blood. Allow one hour for this exam. There are no restrictions or special instructions.  Your physician has requested that you have an echocardiogram. Echocardiography is a painless test that uses sound waves to create images of your heart. It provides your doctor with information about the size and shape of your heart and how well your heart's chambers and valves are working. This procedure takes approximately one hour. There are no restrictions for this procedure.   Follow-up  Schedule appointment with [name] for implantable loop removal   Your physician wants you to follow-up in: 1 year with [name]. You will receive a reminder letter in the mail two months in advance. If you don't receive a letter, please call our office to schedule the follow-up appointment.  If you need a refill on your cardiac medications before your next appointment, please call your pharmacy.

## 2015-12-26 ENCOUNTER — Encounter: Payer: BC Managed Care – PPO | Admitting: Internal Medicine

## 2015-12-26 DIAGNOSIS — R0989 Other specified symptoms and signs involving the circulatory and respiratory systems: Secondary | ICD-10-CM

## 2015-12-29 ENCOUNTER — Other Ambulatory Visit: Payer: Self-pay | Admitting: Cardiology

## 2015-12-29 ENCOUNTER — Encounter: Payer: Self-pay | Admitting: Internal Medicine

## 2015-12-29 DIAGNOSIS — I6523 Occlusion and stenosis of bilateral carotid arteries: Secondary | ICD-10-CM

## 2015-12-30 ENCOUNTER — Other Ambulatory Visit: Payer: Self-pay

## 2015-12-30 ENCOUNTER — Ambulatory Visit (HOSPITAL_COMMUNITY): Payer: BC Managed Care – PPO | Attending: Cardiology

## 2015-12-30 ENCOUNTER — Ambulatory Visit (INDEPENDENT_AMBULATORY_CARE_PROVIDER_SITE_OTHER): Payer: BC Managed Care – PPO | Admitting: *Deleted

## 2015-12-30 DIAGNOSIS — G4733 Obstructive sleep apnea (adult) (pediatric): Secondary | ICD-10-CM | POA: Insufficient documentation

## 2015-12-30 DIAGNOSIS — I6310 Cerebral infarction due to embolism of unspecified precerebral artery: Secondary | ICD-10-CM | POA: Diagnosis not present

## 2015-12-30 DIAGNOSIS — I4891 Unspecified atrial fibrillation: Secondary | ICD-10-CM | POA: Diagnosis not present

## 2015-12-30 DIAGNOSIS — I059 Rheumatic mitral valve disease, unspecified: Secondary | ICD-10-CM

## 2015-12-30 DIAGNOSIS — I358 Other nonrheumatic aortic valve disorders: Secondary | ICD-10-CM | POA: Diagnosis not present

## 2015-12-30 DIAGNOSIS — I340 Nonrheumatic mitral (valve) insufficiency: Secondary | ICD-10-CM | POA: Diagnosis not present

## 2015-12-30 DIAGNOSIS — Z5181 Encounter for therapeutic drug level monitoring: Secondary | ICD-10-CM | POA: Diagnosis not present

## 2015-12-30 DIAGNOSIS — I050 Rheumatic mitral stenosis: Secondary | ICD-10-CM | POA: Diagnosis present

## 2015-12-30 DIAGNOSIS — I119 Hypertensive heart disease without heart failure: Secondary | ICD-10-CM | POA: Insufficient documentation

## 2015-12-30 LAB — POCT INR: INR: 2.8

## 2016-01-01 ENCOUNTER — Encounter: Payer: Self-pay | Admitting: Cardiology

## 2016-01-01 ENCOUNTER — Telehealth: Payer: Self-pay | Admitting: *Deleted

## 2016-01-01 ENCOUNTER — Ambulatory Visit (HOSPITAL_COMMUNITY)
Admission: RE | Admit: 2016-01-01 | Discharge: 2016-01-01 | Disposition: A | Discharge status: Home / Self Care | Payer: BC Managed Care – PPO | Source: Ambulatory Visit | Attending: Cardiology | Admitting: Cardiology

## 2016-01-01 DIAGNOSIS — I6523 Occlusion and stenosis of bilateral carotid arteries: Secondary | ICD-10-CM

## 2016-01-01 DIAGNOSIS — I10 Essential (primary) hypertension: Secondary | ICD-10-CM | POA: Diagnosis not present

## 2016-01-01 DIAGNOSIS — G4733 Obstructive sleep apnea (adult) (pediatric): Secondary | ICD-10-CM | POA: Insufficient documentation

## 2016-01-01 DIAGNOSIS — E785 Hyperlipidemia, unspecified: Secondary | ICD-10-CM | POA: Insufficient documentation

## 2016-01-01 NOTE — Telephone Encounter (Signed)
Left message for pt to call.

## 2016-01-01 NOTE — Telephone Encounter (Signed)
Returning call,thinks it might been about her test results.

## 2016-01-01 NOTE — Telephone Encounter (Signed)
-----   Message from [name], MD sent at 01/01/2016  5:09 PM EDT ----- Arrange PV eval with Dr Fletcher Anon or Dr Helayne Seminole

## 2016-01-01 NOTE — Telephone Encounter (Signed)
This encounter was created in error - please disregard.

## 2016-01-02 ENCOUNTER — Other Ambulatory Visit: Payer: Self-pay | Admitting: Cardiology

## 2016-01-02 NOTE — Telephone Encounter (Signed)
Spoke to patient. She understands recommendation for PV consult.  Has further questions which I will relay to Dr. Kennon Holter nurse.  PV consult ordered.

## 2016-01-02 NOTE — Telephone Encounter (Signed)
New Message ° °Pt returning RN call. Please call back to discuss  °

## 2016-01-02 NOTE — Telephone Encounter (Signed)
Returned call to patient. She wanted to know what she is supposed to do next. Explained to her that the referral is in the system and someone will call her to schedule an appointment with on of the [facility] doctors. Then at that point the doctor will make his evaluation. She verbalized understanding and did not have any further questions.

## 2016-01-02 NOTE — Telephone Encounter (Signed)
Left msg to call.

## 2016-01-04 LAB — CUP PACEART REMOTE DEVICE CHECK: Date Time Interrogation Session: 20170727050801

## 2016-01-05 ENCOUNTER — Ambulatory Visit (INDEPENDENT_AMBULATORY_CARE_PROVIDER_SITE_OTHER): Payer: BC Managed Care – PPO | Admitting: *Deleted

## 2016-01-05 DIAGNOSIS — I6310 Cerebral infarction due to embolism of unspecified precerebral artery: Secondary | ICD-10-CM

## 2016-01-06 NOTE — Progress Notes (Signed)
Carelink Summary Report / Loop Recorder 

## 2016-01-20 ENCOUNTER — Encounter: Payer: Self-pay | Admitting: Family

## 2016-01-20 ENCOUNTER — Ambulatory Visit (INDEPENDENT_AMBULATORY_CARE_PROVIDER_SITE_OTHER): Payer: BC Managed Care – PPO | Admitting: Family

## 2016-01-20 VITALS — BP 152/78 | HR 65 | Temp 98.7°F | Resp 18 | Ht 62.5 in | Wt 173.6 lb

## 2016-01-20 DIAGNOSIS — E785 Hyperlipidemia, unspecified: Secondary | ICD-10-CM

## 2016-01-20 DIAGNOSIS — R5383 Other fatigue: Secondary | ICD-10-CM

## 2016-01-20 DIAGNOSIS — R079 Chest pain, unspecified: Secondary | ICD-10-CM

## 2016-01-20 DIAGNOSIS — I10 Essential (primary) hypertension: Secondary | ICD-10-CM

## 2016-01-20 NOTE — Progress Notes (Signed)
Pre visit review using our clinic review tool, if applicable. No additional management support is needed unless otherwise documented below in the visit note. 

## 2016-01-20 NOTE — Patient Instructions (Addendum)
Please complete your lab work prior to leaving. You will be contacted by Dr. Jacalyn Lefevre nurse about follow up with him in the office.  Please go to the Emergency Room if you develop recurrent chest pain.

## 2016-01-20 NOTE — Assessment & Plan Note (Signed)
Stable on statin, monitor.

## 2016-01-20 NOTE — Assessment & Plan Note (Signed)
BP mildly elevated today. Monitor on current meds for now.

## 2016-01-20 NOTE — Progress Notes (Signed)
Subjective:    Patient ID: Marie Malone, female    DOB: Dec 19, 1950, 65 y.o.   MRN: [id number]  HPI   Marie Malone is a 65 yr old female who presents today for follow up.  1) HTN- reports good compliance with current medications.  BP Readings from Last 3 Encounters:  01/20/16 (!) 141/61  12/18/15 128/74  10/07/15 119/86   2) Hyperlipidemia- on lipitor 80mg .  Lab Results  Component Value Date   CHOL 164 07/22/2015   HDL 68.90 07/22/2015   LDLCALC 77 07/22/2015   TRIG 91.0 07/22/2015   CHOLHDL 2 07/22/2015   3) Chest pain- pt reports intermittent tightness in her chest.  This occurs with exertion. Has had weakness with activity x 2 months.     Review of Systems See HPI  Past Medical History:  Diagnosis Date  . Atrial fibrillation ([place])   . Complication of anesthesia    difficult to wake up from anesthesia  . Heart murmur   . History of rheumatic fever    as a child  . Hyperlipidemia   . Hypertension   . Mitral stenosis   . OSA (obstructive sleep apnea)    mild per sleep study 2008  . Pneumonia    double pneumonia once  . Stroke [facility])      Social History   Social History  . Marital status: Married    Spouse name: N/A  . Number of children: 2  . Years of education: Associates   Occupational History  . Not on file.   Social History Main Topics  . Smoking status: Former Smoker    Packs/day: 1.00    Years: 5.00    Types: Cigarettes    Start date: 07/15/1970    Quit date: 05/11/1975  . Smokeless tobacco: Never Used     Comment: quit smoking 36 years ago  . Alcohol use No     Comment: drank years ago when young  . Drug use: No  . Sexual activity: Not on file   Other Topics Concern  . Not on file   Social History Narrative   Retired Archivist   Married   She has 2 grown children-   Daughter lives in [place]   Son lives in [place]   6 grandchildren        Past Surgical History:  Procedure Laterality Date  . ABDOMINAL  HYSTERECTOMY    . APPENDECTOMY  1989   appendix ruptured,had peritonitis  . CARDIAC SURGERY  2008   "balloon surgery at [facility]"  . COLONOSCOPY W/ POLYPECTOMY    . 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 LOOP RECORDER IMPLANT N/A 04/10/2013   Procedure: LOOP RECORDER IMPLANT;  Surgeon: Coralyn Mark, MD;  Location: [place] CATH LAB;  Service: Cardiovascular;  Laterality: N/A;  . TEE WITHOUT CARDIOVERSION N/A 04/10/2013   Procedure: TRANSESOPHAGEAL ECHOCARDIOGRAM (TEE);  Surgeon: [name], MD;  Location: [facility] ENDOSCOPY;  Service: Cardiovascular;  Laterality: N/A;    Family History  Problem Relation Age of Onset  . Diabetes Mother   . Stroke Mother   . Diabetes Father   . Heart disease Father     CHF  . Cancer Father     kidney  . Diabetes Sister   . Diabetes Brother   . Hypertension Daughter     No Known Allergies  Current Outpatient Prescriptions on File Prior to Visit  Medication Sig  Dispense Refill  . acetaminophen (TYLENOL) 325 MG tablet Take 650 mg by mouth as needed.    [name] Kitchen amLODipine (NORVASC) 10 MG tablet Take 1 tablet (10 mg total) by mouth daily. 30 tablet 5  . atorvastatin (LIPITOR) 80 MG tablet TAKE ONE TABLET BY MOUTH ONCE DAILY AT 6 PM 30 tablet 5  . diphenhydrAMINE (BENADRYL) 25 mg capsule Take 25 mg by mouth as needed.    . metoprolol succinate (TOPROL-XL) 25 MG 24 hr tablet TAKE ONE TABLET BY MOUTH ONCE DAILY 30 tablet 5  . potassium chloride SA (K-DUR,KLOR-CON) 20 MEQ tablet Take 1 tablet (20 mEq total) by mouth daily. 30 tablet 5  . warfarin (COUMADIN) 5 MG tablet Take 1 to 1.5 tablets by mouth daily as directed by coumadin clinic 45 tablet 3   No current facility-administered medications on file prior to visit.     BP (!) 152/78   Pulse 65   Temp 98.7 F (37.1 C) (Oral)   Resp 18   Ht 5' 2.5" (1.588 m)   Wt 173 lb 9.6 oz (78.7 kg)   SpO2 100% Comment: room air  BMI 31.25 kg/m       Objective:    Physical Exam  Constitutional: She is oriented to person, place, and time. She appears well-developed and well-nourished.  HENT:  Head: Normocephalic and atraumatic.  Cardiovascular: Normal rate, regular rhythm and normal heart sounds.   No murmur heard. Pulmonary/Chest: Effort normal and breath sounds normal. No respiratory distress. She has no wheezes.  Musculoskeletal: She exhibits no edema.  Neurological: She is alert and oriented to person, place, and time.  Psychiatric: She has a normal mood and affect. Her behavior is normal. Judgment and thought content normal.          Assessment & Plan:  Chest pain with exertion- EKG is reviewed and show no acute ischemic changes.  Hx is concerning. I reviewed the case with her cardiologist, [name] and he will make arrangements to bring her back into his office for further evaluation.   Fatigue- will check CBC and TSH to further evaluate.

## 2016-01-20 NOTE — Progress Notes (Signed)
HPI: FU MS and atrial fibrillation. Patient has a history of rheumatic fever. Cardiac catheterization in [facility] in June of 2008 showed normal LV function, no coronary disease, moderate mitral stenosis with a valve area of 1.1 cm, and severe pulmonary hypertension. TEE in [facility] in 2008 showed normal LV function and moderate mitral stenosis with a valve area of 1.4 cm, mild MR. Patient had valvuloplasty at [facility] in 2008. Nuclear study in April of 2014 showed an ejection fraction of 56%. There was a fixed anterior defect consistent with soft tissue attenuation but no ischemia. Abdominal ultrasound in April of 2014 normal. Patient was admitted 11/14 with a CVA. Transesophageal echocardiogram 12/14 showed normal LV function. There was mild mitral stenosis and mild mitral regurgitation. The left atrium was dilated and there was spontaneous contrast in the left atrial appendage but no thrombus. There was mild spontaneous contrast in the right atrium. There was a small oscillating density on the anterior mitral valve leaflet of uncertain etiology and vegetation could not be excluded. There was a small oscillating density on the aortic valve felt most likely to be Lambl's excrescence. Patient was treated for SBE; blood cultures negative. Patient did have an implantable loop placed prior to discharge. This ultimately revealed paroxysmal atrial fibrillation and anticoagulation was initiated. Carotid dopplers 8/17 showed 60-79 Right and 1-39 left stenosis; CTA recommended. Echo 8/17 showed normal LV function; moderate MS (mean gradient 8 mmHg), moderate MR, severe LAE. Since last seen, Patient notes some tightnessIn her chest and dyspnea with exertion. It is typically relieved with rest but can last up to one hour. There is no orthopnea, PND or pedal edema. No syncope or bleeding.  Current Outpatient Prescriptions  Medication Sig Dispense Refill  . acetaminophen (TYLENOL) 325 MG tablet Take 650 mg by  mouth as needed.    [name] Kitchen amLODipine (NORVASC) 10 MG tablet Take 1 tablet (10 mg total) by mouth daily. 30 tablet 5  . atorvastatin (LIPITOR) 80 MG tablet TAKE ONE TABLET BY MOUTH ONCE DAILY AT 6 PM 30 tablet 5  . diphenhydrAMINE (BENADRYL) 25 mg capsule Take 25 mg by mouth as needed.    . metoprolol succinate (TOPROL-XL) 25 MG 24 hr tablet TAKE ONE TABLET BY MOUTH ONCE DAILY 30 tablet 5  . potassium chloride SA (K-DUR,KLOR-CON) 20 MEQ tablet Take 1 tablet (20 mEq total) by mouth daily. 30 tablet 5  . warfarin (COUMADIN) 5 MG tablet Take 1 to 1.5 tablets by mouth daily as directed by coumadin clinic 45 tablet 3   No current facility-administered medications for this visit.      Past Medical History:  Diagnosis Date  . Atrial fibrillation ([place])   . Complication of anesthesia    difficult to wake up from anesthesia  . Heart murmur   . History of rheumatic fever    as a child  . Hyperlipidemia   . Hypertension   . Mitral stenosis   . OSA (obstructive sleep apnea)    mild per sleep study 2008  . Pneumonia    double pneumonia once  . Stroke [facility])     Past Surgical History:  Procedure Laterality Date  . ABDOMINAL HYSTERECTOMY    . APPENDECTOMY  1989   appendix ruptured,had peritonitis  . CARDIAC SURGERY  2008   "balloon surgery at [facility]"  . COLONOSCOPY W/ POLYPECTOMY    . 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  LOOP RECORDER IMPLANT N/A 04/10/2013   Procedure: LOOP RECORDER IMPLANT;  Surgeon: Coralyn Mark, MD;  Location: [place] CATH LAB;  Service: Cardiovascular;  Laterality: N/A;  . TEE WITHOUT CARDIOVERSION N/A 04/10/2013   Procedure: TRANSESOPHAGEAL ECHOCARDIOGRAM (TEE);  Surgeon: [name], MD;  Location: [facility] ENDOSCOPY;  Service: Cardiovascular;  Laterality: N/A;    Social History   Social History  . Marital status: Married    Spouse name: N/A  . Number of children: 2  . Years of education: Associates   Occupational  History  . Not on file.   Social History Main Topics  . Smoking status: Former Smoker    Packs/day: 1.00    Years: 5.00    Types: Cigarettes    Start date: 07/15/1970    Quit date: 05/11/1975  . Smokeless tobacco: Never Used     Comment: quit smoking 36 years ago  . Alcohol use No     Comment: drank years ago when young  . Drug use: No  . Sexual activity: Not on file   Other Topics Concern  . 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 Diabetes Mother   . Stroke Mother   . Diabetes Father   . Heart disease Father     CHF  . Cancer Father     kidney  . Diabetes Sister   . Diabetes Brother   . Hypertension Daughter     ROS: no fevers or chills, productive cough, hemoptysis, dysphasia, odynophagia, melena, hematochezia, dysuria, hematuria, rash, seizure activity, orthopnea, PND, pedal edema, claudication. Remaining systems are negative.  Physical Exam: Well-developed obese in no acute distress.  Skin is warm and dry.  HEENT is normal.  Neck is supple.  Chest is clear to auscultation with normal expansion.  Cardiovascular exam is regular rate and rhythm.  Abdominal exam nontender or distended. No masses palpated. Extremities show no edema. neuro grossly intact  A/P  1 Paroxysmal atrial fibrillation/flutter-continue metoprolol. Continue Coumadin.  2 exertional shortness of breath/chest tightness-symptoms with both typical and atypical features. Plan Lexiscan nuclear study for risk stratification.  3 history of implantable loop recorder-patient has an appointment with [name] to have her device removed.  4 Carotid artery disease-continue statin. No aspirin given need for anticoagulation. I will arrange a CTA to further assess recent documentation of significant  disease. She has an appointment to see Dr. Gwenlyn Found as well. We will check renal function and BNP prior to CTA.  5 hypertension-blood pressure controlled. Continue present medications.  6 history of mitral stenosis/balloon valvuloplasty-follow-up echocardiogram shows moderate mitral stenosis and moderate mitral regurgitation. I have explained she may require mitral valve replacement in the future.  7 Hyperlipidemia-continue statin.  [name], MD

## 2016-01-21 ENCOUNTER — Telehealth: Payer: Self-pay | Admitting: *Deleted

## 2016-01-21 ENCOUNTER — Ambulatory Visit (INDEPENDENT_AMBULATORY_CARE_PROVIDER_SITE_OTHER): Payer: BC Managed Care – PPO | Admitting: Cardiology

## 2016-01-21 ENCOUNTER — Encounter: Payer: Self-pay | Admitting: Family

## 2016-01-21 ENCOUNTER — Encounter: Payer: Self-pay | Admitting: Cardiology

## 2016-01-21 VITALS — BP 129/75 | HR 79 | Ht 62.5 in | Wt 175.0 lb

## 2016-01-21 DIAGNOSIS — I480 Paroxysmal atrial fibrillation: Secondary | ICD-10-CM | POA: Diagnosis not present

## 2016-01-21 DIAGNOSIS — R072 Precordial pain: Secondary | ICD-10-CM

## 2016-01-21 DIAGNOSIS — I6523 Occlusion and stenosis of bilateral carotid arteries: Secondary | ICD-10-CM | POA: Diagnosis not present

## 2016-01-21 DIAGNOSIS — I059 Rheumatic mitral valve disease, unspecified: Secondary | ICD-10-CM | POA: Diagnosis not present

## 2016-01-21 DIAGNOSIS — I10 Essential (primary) hypertension: Secondary | ICD-10-CM

## 2016-01-21 DIAGNOSIS — E875 Hyperkalemia: Secondary | ICD-10-CM

## 2016-01-21 DIAGNOSIS — E785 Hyperlipidemia, unspecified: Secondary | ICD-10-CM

## 2016-01-21 DIAGNOSIS — R0602 Shortness of breath: Secondary | ICD-10-CM

## 2016-01-21 LAB — BASIC METABOLIC PANEL
BUN: 17 mg/dL (ref 6–23)
CHLORIDE: 109 meq/L (ref 96–112)
CO2: 27 mEq/L (ref 19–32)
Calcium: 9.3 mg/dL (ref 8.4–10.5)
Creatinine, Ser: 0.95 mg/dL (ref 0.40–1.20)
GFR: 76.15 mL/min (ref >60.00)
Glucose, Bld: 79 mg/dL (ref 70–99)
POTASSIUM: 5.2 meq/L — AB (ref 3.5–5.1)
Sodium: 142 mEq/L (ref 135–145)

## 2016-01-21 LAB — CBC WITH DIFFERENTIAL/PLATELET
BASOS ABS: 0 10*3/uL (ref 0.0–0.1)
Basophils Relative: 0.4 % (ref 0.0–3.0)
Eosinophils Absolute: 0.1 10*3/uL (ref 0.0–0.7)
Eosinophils Relative: 2.8 % (ref 0.0–5.0)
HCT: 39.4 % (ref 36.0–46.0)
Hemoglobin: 12.7 g/dL (ref 12.0–15.0)
LYMPHS ABS: 1.5 10*3/uL (ref 0.7–4.0)
Lymphocytes Relative: 36.9 % (ref 12.0–46.0)
MCHC: 32.3 g/dL (ref 30.0–36.0)
MCV: 83.8 fl (ref 78.0–100.0)
MONO ABS: 0.7 10*3/uL (ref 0.1–1.0)
MONOS PCT: 17.3 % — AB (ref 3.0–12.0)
NEUTROS PCT: 42.6 % — AB (ref 43.0–77.0)
Neutro Abs: 1.8 10*3/uL (ref 1.4–7.7)
Platelets: 170 10*3/uL (ref 150.0–400.0)
RBC: 4.7 Mil/uL (ref 3.87–5.11)
RDW: 15.6 % — ABNORMAL HIGH (ref 11.5–15.5)
WBC: 4.1 10*3/uL (ref 4.0–10.5)

## 2016-01-21 LAB — TSH: TSH: 0.92 u[IU]/mL (ref 0.35–4.50)

## 2016-01-21 LAB — BRAIN NATRIURETIC PEPTIDE: PRO B NATRI PEPTIDE: 136 pg/mL — AB (ref 0.0–100.0)

## 2016-01-21 NOTE — Patient Instructions (Signed)
.  Medication Instructions:   NO CHANGE  Labwork:  Your physician recommends that you HAVE LAB WORK TODAY ON THE SECOND FLOOR- [facility]  Testing/Procedures:  Non-Cardiac CT Angiography (CTA), is a special type of CT scan that uses a computer to produce multi-dimensional views of major blood vessels throughout the body. In CT angiography, a contrast material is injected through an IV to help visualize the blood vessels   Your physician has requested that you have a lexiscan myoview. For further information please visit [email or web address]. Please follow instruction sheet, as given.    Follow-Up:  Your physician recommends that you schedule a follow-up appointment in: [place]

## 2016-01-21 NOTE — Telephone Encounter (Signed)
Noted and agree. 

## 2016-01-21 NOTE — Progress Notes (Signed)
Debra-- see bmet and BNP results that were added on for your pt today.

## 2016-01-21 NOTE — Addendum Note (Signed)
Addended by: Peggyann Shoals on: 01/21/2016 09:42 AM   Modules accepted: Orders

## 2016-01-21 NOTE — Telephone Encounter (Signed)
Received call from Hilda Blades, [place] with Dr [name] (cardiology) asking if we could add labs to bloodwork we did yesterday for basic metabolic panel and BNP for CTA carotids (carotid stenosis and sob). Add on form faxed to the lab.

## 2016-01-22 ENCOUNTER — Telehealth (HOSPITAL_COMMUNITY): Payer: Self-pay

## 2016-01-22 NOTE — Telephone Encounter (Signed)
Encounter complete. 

## 2016-01-23 ENCOUNTER — Encounter (HOSPITAL_COMMUNITY): Payer: BC Managed Care – PPO

## 2016-01-23 NOTE — Telephone Encounter (Signed)
-----   Message from Debbrah Alar, NP sent at 01/21/2016  3:02 PM EDT ----- Potassium is high. Stop Kdur, repeat bmet in 1 week, dx hyperkalemia.

## 2016-01-23 NOTE — Telephone Encounter (Signed)
Notified pt and she voices understanding. Scheduled lab appt for 01/30/16, order entered.

## 2016-01-23 NOTE — Progress Notes (Signed)
This encounter was created in error - please disregard.

## 2016-01-27 ENCOUNTER — Ambulatory Visit (INDEPENDENT_AMBULATORY_CARE_PROVIDER_SITE_OTHER): Payer: BC Managed Care – PPO | Admitting: Pharmacist

## 2016-01-27 ENCOUNTER — Ambulatory Visit (HOSPITAL_COMMUNITY)
Admission: RE | Admit: 2016-01-27 | Discharge: 2016-01-27 | Disposition: A | Discharge status: Home / Self Care | Payer: BC Managed Care – PPO | Source: Ambulatory Visit | Attending: Cardiovascular Disease | Admitting: Cardiovascular Disease

## 2016-01-27 DIAGNOSIS — Z5181 Encounter for therapeutic drug level monitoring: Secondary | ICD-10-CM | POA: Diagnosis not present

## 2016-01-27 DIAGNOSIS — I4891 Unspecified atrial fibrillation: Secondary | ICD-10-CM

## 2016-01-27 DIAGNOSIS — I6310 Cerebral infarction due to embolism of unspecified precerebral artery: Secondary | ICD-10-CM

## 2016-01-27 DIAGNOSIS — R072 Precordial pain: Secondary | ICD-10-CM

## 2016-01-27 LAB — MYOCARDIAL PERFUSION IMAGING
LVDIAVOL: 70 mL (ref 46–106)
LVSYSVOL: 29 mL
Peak HR: 72 {beats}/min
Rest HR: 55 {beats}/min
SDS: 3
SRS: 0
SSS: 3
TID: 1.32

## 2016-01-27 LAB — POCT INR: INR: 4.1

## 2016-01-27 MED ORDER — REGADENOSON 0.4 MG/5ML IV SOLN
0.4000 mg | Freq: Once | INTRAVENOUS | Status: AC
Start: 2016-01-27 — End: 2016-01-27
  Administered 2016-01-27: 0.4 mg via INTRAVENOUS

## 2016-01-27 MED ORDER — TECHNETIUM TC 99M TETROFOSMIN IV KIT
31.3000 | PACK | Freq: Once | INTRAVENOUS | Status: AC | PRN
  Administered 2016-01-27: 31.3 via INTRAVENOUS
  Filled 2016-01-27: qty 31

## 2016-01-27 MED ORDER — AMINOPHYLLINE 25 MG/ML IV SOLN
75.0000 mg | Freq: Once | INTRAVENOUS | Status: AC
  Administered 2016-01-27: 75 mg via INTRAVENOUS

## 2016-01-27 MED ORDER — TECHNETIUM TC 99M TETROFOSMIN IV KIT
9.3000 | PACK | Freq: Once | INTRAVENOUS | Status: AC | PRN
  Administered 2016-01-27: 9.3 via INTRAVENOUS
  Filled 2016-01-27: qty 9

## 2016-01-29 ENCOUNTER — Ambulatory Visit (HOSPITAL_BASED_OUTPATIENT_CLINIC_OR_DEPARTMENT_OTHER)
Admission: RE | Admit: 2016-01-29 | Discharge: 2016-01-29 | Disposition: A | Discharge status: Home / Self Care | Payer: BC Managed Care – PPO | Source: Ambulatory Visit | Attending: Cardiology | Admitting: Cardiology

## 2016-01-29 ENCOUNTER — Encounter (HOSPITAL_BASED_OUTPATIENT_CLINIC_OR_DEPARTMENT_OTHER): Payer: Self-pay

## 2016-01-29 DIAGNOSIS — I6523 Occlusion and stenosis of bilateral carotid arteries: Secondary | ICD-10-CM

## 2016-01-29 DIAGNOSIS — I6503 Occlusion and stenosis of bilateral vertebral arteries: Secondary | ICD-10-CM | POA: Diagnosis not present

## 2016-01-29 DIAGNOSIS — I700 Atherosclerosis of aorta: Secondary | ICD-10-CM | POA: Insufficient documentation

## 2016-01-29 DIAGNOSIS — M4804 Spinal stenosis, thoracic region: Secondary | ICD-10-CM | POA: Diagnosis not present

## 2016-01-29 MED ORDER — IOPAMIDOL (ISOVUE-370) INJECTION 76%
100.0000 mL | Freq: Once | INTRAVENOUS | Status: AC | PRN
  Administered 2016-01-29: 100 mL via INTRAVENOUS

## 2016-01-30 ENCOUNTER — Other Ambulatory Visit (INDEPENDENT_AMBULATORY_CARE_PROVIDER_SITE_OTHER): Payer: BC Managed Care – PPO

## 2016-01-30 DIAGNOSIS — E875 Hyperkalemia: Secondary | ICD-10-CM

## 2016-01-30 NOTE — Addendum Note (Signed)
Addended by: Caffie Pinto on: 01/30/2016 02:10 PM   Modules accepted: Orders

## 2016-01-30 NOTE — Addendum Note (Signed)
Addended by: Caffie Pinto on: 01/30/2016 02:09 PM   Modules accepted: Orders

## 2016-01-31 LAB — CUP PACEART REMOTE DEVICE CHECK: Date Time Interrogation Session: 20170826051040

## 2016-01-31 LAB — BASIC METABOLIC PANEL
BUN: 15 mg/dL (ref 7–25)
CHLORIDE: 105 mmol/L (ref 98–110)
CO2: 22 mmol/L (ref 20–31)
Calcium: 9.4 mg/dL (ref 8.6–10.4)
Creat: 0.98 mg/dL (ref 0.50–0.99)
GLUCOSE: 82 mg/dL (ref 65–99)
POTASSIUM: 3.4 mmol/L — AB (ref 3.5–5.3)
Sodium: 141 mmol/L (ref 135–146)

## 2016-01-31 NOTE — Progress Notes (Signed)
Carelink summary report received. Battery status OK. Normal device function. No new symptom episodes, tachy episodes, brady, or pause episodes. 45% AF, V rates controlled, +Warfarin. Monthly summary reports and ROV/PRN

## 2016-02-01 ENCOUNTER — Telehealth: Payer: Self-pay | Admitting: Family

## 2016-02-01 DIAGNOSIS — E876 Hypokalemia: Secondary | ICD-10-CM

## 2016-02-01 MED ORDER — POTASSIUM CHLORIDE ER 10 MEQ PO TBCR: 10.0000 meq | EXTENDED_RELEASE_TABLET | Freq: Every day | ORAL | 2 refills | Status: DC

## 2016-02-01 NOTE — Telephone Encounter (Signed)
Potassium is now a bit low.  Lets have her restart Kdur at a lower dose once daily. Repeat bmet in 2 weeks, dx hypokalemia.

## 2016-02-02 ENCOUNTER — Ambulatory Visit (INDEPENDENT_AMBULATORY_CARE_PROVIDER_SITE_OTHER): Payer: BC Managed Care – PPO | Admitting: *Deleted

## 2016-02-02 DIAGNOSIS — I6310 Cerebral infarction due to embolism of unspecified precerebral artery: Secondary | ICD-10-CM

## 2016-02-02 NOTE — Progress Notes (Signed)
Carelink Summary Report / Loop Recorder 

## 2016-02-03 NOTE — Telephone Encounter (Signed)
Attempted to reach pt. Left message to check mychart acct. Message sent. Lab order entered.

## 2016-02-04 ENCOUNTER — Other Ambulatory Visit: Payer: Self-pay | Admitting: Family

## 2016-02-04 DIAGNOSIS — Z0000 Encounter for general adult medical examination without abnormal findings: Secondary | ICD-10-CM

## 2016-02-10 ENCOUNTER — Encounter: Payer: Self-pay | Admitting: Cardiovascular Disease

## 2016-02-10 ENCOUNTER — Ambulatory Visit (INDEPENDENT_AMBULATORY_CARE_PROVIDER_SITE_OTHER): Payer: BC Managed Care – PPO | Admitting: Cardiovascular Disease

## 2016-02-10 ENCOUNTER — Encounter: Payer: Self-pay | Admitting: Surgery

## 2016-02-10 ENCOUNTER — Ambulatory Visit (INDEPENDENT_AMBULATORY_CARE_PROVIDER_SITE_OTHER): Payer: BC Managed Care – PPO | Admitting: Pharmacist Clinician (PhC)/ Clinical Pharmacy Specialist

## 2016-02-10 VITALS — BP 128/79 | HR 78 | Ht 62.0 in | Wt 170.8 lb

## 2016-02-10 DIAGNOSIS — I4891 Unspecified atrial fibrillation: Secondary | ICD-10-CM | POA: Diagnosis not present

## 2016-02-10 DIAGNOSIS — Z5181 Encounter for therapeutic drug level monitoring: Secondary | ICD-10-CM

## 2016-02-10 DIAGNOSIS — I6529 Occlusion and stenosis of unspecified carotid artery: Secondary | ICD-10-CM | POA: Diagnosis not present

## 2016-02-10 LAB — POCT INR: INR: 2.1

## 2016-02-10 NOTE — Progress Notes (Signed)
02/10/2016 Marie Malone   09-14-1950  [id number]  Primary Physician [name], NP Primary Cardiologist: Lorretta Harp MD Renae Gloss  HPI:  Marie Malone is a 65 year old mildly overweight married African-American female mother of 2, her mother of 6 grandchildren who is retired from working at the [employer]. She was referred by [name] for peripheral vascular evaluation because of a high-grade asymptomatic right ICA stenosis. Recent Doppler performed 8/24's/17 suggested a "string sign of the right ICA. A follow-up CTA performed 01/29/16 confirm this. She has a history of rheumatic heart disease status post valvuloplasty performed at [facility] in 2008. Her problems include treated hypertension and hyperlipidemia. She apparently had a remote stroke. She denies chest pain or shortness of breath. Recent 2-D echo revealed preserved LV function and Myoview stress test was low risk.   Current Outpatient Prescriptions  Medication Sig Dispense Refill  . acetaminophen (TYLENOL) 325 MG tablet Take 650 mg by mouth as needed.    [name] Kitchen amLODipine (NORVASC) 10 MG tablet TAKE ONE TABLET BY MOUTH ONCE DAILY 30 tablet 5  . atorvastatin (LIPITOR) 80 MG tablet TAKE ONE TABLET BY MOUTH ONCE DAILY AT 6 PM 30 tablet 5  . diphenhydrAMINE (BENADRYL) 25 mg capsule Take 25 mg by mouth as needed.    . metoprolol succinate (TOPROL-XL) 25 MG 24 hr tablet TAKE ONE TABLET BY MOUTH ONCE DAILY 30 tablet 5  . potassium chloride (K-DUR) 10 MEQ tablet Take 1 tablet (10 mEq total) by mouth daily. 30 tablet 2  . warfarin (COUMADIN) 5 MG tablet Take 1 to 1.5 tablets by mouth daily as directed by coumadin clinic 45 tablet 3   No current facility-administered medications for this visit.     No Known Allergies  Social History   Social History  . Marital status: Married    Spouse name: N/A  . Number of children: 2  . Years of education: Associates   Occupational History  . Not on file.    Social History Main Topics  . Smoking status: Former Smoker    Packs/day: 1.00    Years: 5.00    Types: Cigarettes    Start date: 07/15/1970    Quit date: 05/11/1975  . Smokeless tobacco: Never Used     Comment: quit smoking 36 years ago  . Alcohol use No     Comment: drank years ago when young  . Drug use: No  . Sexual activity: Not on file   Other Topics Concern  . 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  Cardiovascular: negative for chest pain, dyspnea on exertion, edema, orthopnea, palpitations, paroxysmal nocturnal dyspnea or shortness of breath Dermatological: negative for rash Respiratory: negative for cough or wheezing Urologic: negative for hematuria Abdominal: negative for nausea, vomiting, diarrhea, bright red blood per rectum, melena, or hematemesis Neurologic: negative for visual changes, syncope, or dizziness All other systems reviewed and are otherwise negative except as noted above.    Blood pressure 128/79, pulse 78, height 5\' 2"  (1.575 m), weight 170 lb 12.8 oz (77.5 kg), SpO2 96 %.  General appearance: alert and no distress Neck: no adenopathy, no carotid bruit, no JVD,  supple, symmetrical, trachea midline and thyroid not enlarged, symmetric, no tenderness/mass/nodules Lungs: clear to auscultation bilaterally Heart: regular rate and rhythm, S1, S2 normal, no murmur, click, rub or gallop Extremities: extremities normal, atraumatic, no cyanosis or edema  EKG not performed today  ASSESSMENT AND PLAN:   Carotid stenosis Marie Creer  has high-grade right ICA stenosis both by duplex ultrasound and CTA (string sign). She apparently has had a remote stroke. Recent 2-D echo revealed normal LV function and Myoview stress  test was low risk. She is a suitable candidate for endarterectomy. She is on Coumadin for PAF. I'm going to refer her to [facility] for elective right carotid endarterectomy.      Lorretta Harp MD FACP,FACC,FAHA, FSCAI 02/10/2016 9:00 AM

## 2016-02-10 NOTE — Patient Instructions (Signed)
Medication Instructions:  NO CHANGES.   Follow-Up: You have been referred to [facility] FOR EVALUATION IN THE NEXT 7-10 DAYS.  Your physician recommends that you schedule a follow-up appointment WITH DR CRENSHAW AS SCHEDULED.   If you need a refill on your cardiac medications before your next appointment, please call your pharmacy.

## 2016-02-10 NOTE — Assessment & Plan Note (Signed)
Marie Malone  has high-grade right ICA stenosis both by duplex ultrasound and CTA (string sign). She apparently has had a remote stroke. Recent 2-D echo revealed normal LV function and Myoview stress test was low risk. She is a suitable candidate for endarterectomy. She is on Coumadin for PAF. I'm going to refer her to [facility] for elective right carotid endarterectomy.

## 2016-02-11 ENCOUNTER — Other Ambulatory Visit: Payer: Self-pay

## 2016-02-11 ENCOUNTER — Encounter: Payer: Self-pay | Admitting: Surgery

## 2016-02-11 ENCOUNTER — Ambulatory Visit (INDEPENDENT_AMBULATORY_CARE_PROVIDER_SITE_OTHER): Payer: BC Managed Care – PPO | Admitting: Surgery

## 2016-02-11 ENCOUNTER — Telehealth: Payer: Self-pay | Admitting: Cardiology

## 2016-02-11 VITALS — BP 154/75 | HR 70 | Temp 98.2°F | Ht 62.0 in | Wt 169.0 lb

## 2016-02-11 DIAGNOSIS — I6521 Occlusion and stenosis of right carotid artery: Secondary | ICD-10-CM

## 2016-02-11 NOTE — Telephone Encounter (Signed)
Ok for surgery Marie Malone  

## 2016-02-11 NOTE — Progress Notes (Signed)
[facility]  Patient name: Marie Malone MRN: [id number] DOB: 1950/07/21 Sex: female  REFERRING PHYSICIAN: Dr. Gwenlyn Found  REASON FOR CONSULT: Carotid stenosis  HPI: Marie Malone is a 65 y.o. female, who is referred for management of a high grade asymptomatic right carotid stenosis.  The patient underwent u/s testing which revealed 60-79% stenosis (at the top of the range) which led to a CTA of the neck revealing a radiographic string sign.  She denies any current symptoms.  She does have a history of a stroke in the remote past in 2014 where she developed slurred speech or facial droop and right arm weakness.  She will occasionally get fatigue in her right arm.  She will occasionally slower her words.  She has never had any symptoms affecting the left side of her body.  She denies amaurosis.  The patient has a history of rheumatic heart disease and underwent valvuloplasty at [facility] in 2008.  She is medically managed for hypercholesterolemia with a statin.  She is treated for hypertension.  She is a former smoker.  She does take Coumadin for atrial fibrillation  Past Medical History:  Diagnosis Date  . Atrial fibrillation ([place])   . Complication of anesthesia    difficult to wake up from anesthesia  . Heart murmur   . History of rheumatic fever    as a child  . Hyperlipidemia   . Hypertension   . Mitral stenosis   . OSA (obstructive sleep apnea)    mild per sleep study 2008  . Pneumonia    double pneumonia once  . Stroke [facility])     Family History  Problem Relation Age of Onset  . Diabetes Mother   . Stroke Mother   . Diabetes Father   . Heart disease Father     CHF  . Cancer Father     kidney  . Diabetes Sister   . Diabetes Brother   . Hypertension Daughter     SOCIAL HISTORY: Social History   Social History  . Marital status: Married    Spouse name: N/A  . Number of children: 2  . Years of education:  Associates   Occupational History  . Not on file.   Social History Main Topics  . Smoking status: Former Smoker    Packs/day: 1.00    Years: 5.00    Types: Cigarettes    Start date: 07/15/1970    Quit date: 05/11/1975  . Smokeless tobacco: Never Used     Comment: quit smoking 36 years ago  . Alcohol use No     Comment: drank years ago when young  . Drug use: No  . Sexual activity: Not on file   Other Topics Concern  . Not on file   Social History Narrative   Retired Archivist   Married   She has 2 grown children-   Daughter lives in [place]   Son lives in [place]   6 grandchildren        No Known Allergies  Current Outpatient Prescriptions  Medication Sig Dispense Refill  . acetaminophen (TYLENOL) 325 MG tablet Take 650 mg by mouth as needed.    [name] Kitchen amLODipine (NORVASC) 10 MG tablet TAKE ONE TABLET BY MOUTH ONCE DAILY 30 tablet 5  . atorvastatin (LIPITOR) 80 MG tablet TAKE ONE TABLET BY MOUTH ONCE DAILY AT 6 PM 30 tablet 5  . diphenhydrAMINE (BENADRYL) 25 mg capsule Take 25 mg by mouth as  needed.    . metoprolol succinate (TOPROL-XL) 25 MG 24 hr tablet TAKE ONE TABLET BY MOUTH ONCE DAILY 30 tablet 5  . potassium chloride (K-DUR) 10 MEQ tablet Take 1 tablet (10 mEq total) by mouth daily. 30 tablet 2  . warfarin (COUMADIN) 5 MG tablet Take 1 to 1.5 tablets by mouth daily as directed by coumadin clinic 45 tablet 3   No current facility-administered medications for this visit.     REVIEW OF SYSTEMS:  [X]  denotes positive finding, [ ]  denotes negative finding Cardiac  Comments:  Chest pain or chest pressure:    Shortness of breath upon exertion:    Short of breath when lying flat:    Irregular heart rhythm: x       Vascular    Pain in calf, thigh, or hip brought on by ambulation:    Pain in feet at night that wakes you up from your sleep:  x   Blood clot in your veins:    Leg swelling:         Pulmonary    Oxygen at home:    Productive cough:       Wheezing:         Neurologic    Sudden weakness in arms or legs:     Sudden numbness in arms or legs:     Sudden onset of difficulty speaking or slurred speech:    Temporary loss of vision in one eye:     Problems with dizziness:         Gastrointestinal    Blood in stool:     Vomited blood:         Genitourinary    Burning when urinating:     Blood in urine:        Psychiatric    Malone depression:         Hematologic    Bleeding problems:    Problems with blood clotting too easily:        Skin    Rashes or ulcers:        Constitutional    Fever or chills:      PHYSICAL EXAM: Vitals:   02/11/16 0933 02/11/16 0935  BP: 129/73 (!) 154/75  Pulse: 68 70  Temp:  98.2 F (36.8 C)  TempSrc:  Oral  Weight:  169 lb (76.7 kg)  Height:  5\' 2"  (1.575 m)    GENERAL: The patient is a well-nourished female, in no acute distress. The vital signs are documented above. CARDIAC: There is a regular rate and rhythm.  VASCULAR: No carotid bruits PULMONARY: There is good air exchange bilaterally without wheezing or rales. ABDOMEN: Soft and non-tender with normal pitched bowel sounds.  MUSCULOSKELETAL: There are no Malone deformities or cyanosis. NEUROLOGIC: No focal weakness or paresthesias are detected. SKIN: There are no ulcers or rashes noted. PSYCHIATRIC: The patient has a normal affect.  DATA:  I have reviewed her ultrasound studies as well as her CT angiogram which reveals a high-grade right carotid stenosis.  The bifurcation is in the mid neck.  ASSESSMENT AND PLAN: The matted right carotid stenosis.  We discussed proceeding with right carotid endarterectomy.  I discussed the risks and benefits of the procedure with the patient as well as with her daughter via telephone.  We discussed risks of stroke, nerve injury, cardiopulmonary complications.  All other questions were answered.  Her operations been scheduled for Wednesday, November 1.  The patient takes Coumadin for  atrial fibrillation.  I suspect that this was the likely cause of her stroke several years ago and therefore I would recommend bridging her with Lovenox.  I would also like for her to begin a baby aspirin starting one week prior to her operation.   Marie Major, MD [facility] [phone] Pager [phone]

## 2016-02-11 NOTE — Telephone Encounter (Signed)
New message      Request for surgical clearance:  What type of surgery is being performed? Carotid endarterectomy 1. When is this surgery scheduled? 03-10-16  Are there any medications that need to be held prior to surgery and how long? Hold coumadin 5 days prior 2. Name of physician performing surgery?  Dr Trula Slade  3. What is your office phone and fax number?  Fax [phone] or send thru epic

## 2016-02-11 NOTE — Telephone Encounter (Signed)
Clearance and coumadin hold request routed to provider and coumadin clinic.

## 2016-02-12 NOTE — Telephone Encounter (Signed)
Routed via e-fax to requesting provider.

## 2016-02-12 NOTE — Telephone Encounter (Signed)
Pt with AFIB and history of embolic stroke will need to bridge with lovenox. She has appt scheduled for 10/24 to discuss Lovenox bridge.

## 2016-02-18 ENCOUNTER — Other Ambulatory Visit (INDEPENDENT_AMBULATORY_CARE_PROVIDER_SITE_OTHER): Payer: BC Managed Care – PPO

## 2016-02-18 DIAGNOSIS — E876 Hypokalemia: Secondary | ICD-10-CM

## 2016-02-18 LAB — BASIC METABOLIC PANEL
BUN: 20 mg/dL (ref 6–23)
CO2: 24 mEq/L (ref 19–32)
Calcium: 9.8 mg/dL (ref 8.4–10.5)
Chloride: 104 mEq/L (ref 96–112)
Creatinine, Ser: 1.12 mg/dL (ref 0.40–1.20)
GFR: 62.96 mL/min (ref >60.00)
GLUCOSE: 72 mg/dL (ref 70–99)
POTASSIUM: 3.5 meq/L (ref 3.5–5.1)
SODIUM: 139 meq/L (ref 135–145)

## 2016-02-20 ENCOUNTER — Encounter (HOSPITAL_COMMUNITY): Payer: BC Managed Care – PPO

## 2016-02-20 ENCOUNTER — Encounter: Payer: BC Managed Care – PPO | Admitting: Vascular Surgery

## 2016-02-24 ENCOUNTER — Encounter: Payer: Self-pay | Admitting: Internal Medicine

## 2016-02-24 ENCOUNTER — Ambulatory Visit (INDEPENDENT_AMBULATORY_CARE_PROVIDER_SITE_OTHER): Payer: BC Managed Care – PPO | Admitting: Internal Medicine

## 2016-02-24 VITALS — BP 128/64 | HR 88 | Ht 62.0 in | Wt 171.2 lb

## 2016-02-24 DIAGNOSIS — Z959 Presence of cardiac and vascular implant and graft, unspecified: Secondary | ICD-10-CM

## 2016-02-24 DIAGNOSIS — I4891 Unspecified atrial fibrillation: Secondary | ICD-10-CM | POA: Diagnosis not present

## 2016-02-24 NOTE — Patient Instructions (Signed)
Medication Instructions: - Your physician recommends that you continue on your current medications as directed. Please refer to the Current Medication list given to you today.  Labwork: - none ordered  Procedures/Testing: - [name] will remove your loop recorder on 03/10/16 when you go to the hospital for your carotid surgery.   Follow-Up: - [name] will see you back on an as needed basis.  Any Additional Special Instructions Will Be Listed Below (If Applicable).     If you need a refill on your cardiac medications before your next appointment, please call your pharmacy.

## 2016-02-24 NOTE — Progress Notes (Signed)
Patient Care Team: Debbrah Alar, NP as PCP - General (Internal Medicine) Deneise Lever, MD as Consulting Physician (Pulmonary Disease) Collene Gobble, MD as Consulting Physician (Pulmonary Disease) Deboraha Sprang, MD as Consulting Physician (Cardiology) [name], MD as Consulting Physician (Cardiology) Lorretta Harp, MD as Consulting Physician (Cardiology)   HPI  Marie Malone is a 65 y.o. female Seen at the request of Dr. London Sheer to discuss removal of a link implanted 2014 for cryptogenic stroke is also having demonstrated atrial fibrillation     Past Medical History:  Diagnosis Date  . Atrial fibrillation ([place])   . Complication of anesthesia    difficult to wake up from anesthesia  . Heart murmur   . History of rheumatic fever    as a child  . Hyperlipidemia   . Hypertension   . Mitral stenosis   . OSA (obstructive sleep apnea)    mild per sleep study 2008  . Pneumonia    double pneumonia once  . Stroke [facility])     Past Surgical History:  Procedure Laterality Date  . ABDOMINAL HYSTERECTOMY    . APPENDECTOMY  1989   appendix ruptured,had peritonitis  . CARDIAC SURGERY  2008   "balloon surgery at [facility]"  . COLONOSCOPY W/ POLYPECTOMY    . 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 LOOP RECORDER IMPLANT N/A 04/10/2013   Procedure: LOOP RECORDER IMPLANT;  Surgeon: Coralyn Mark, MD;  Location: [place] CATH LAB;  Service: Cardiovascular;  Laterality: N/A;  . TEE WITHOUT CARDIOVERSION N/A 04/10/2013   Procedure: TRANSESOPHAGEAL ECHOCARDIOGRAM (TEE);  Surgeon: [name], MD;  Location: [facility] ENDOSCOPY;  Service: Cardiovascular;  Laterality: N/A;    Current Outpatient Prescriptions  Medication Sig Dispense Refill  . acetaminophen (TYLENOL) 325 MG tablet Take 650 mg by mouth as needed.    [name] Kitchen amLODipine (NORVASC) 10 MG tablet TAKE ONE TABLET BY MOUTH ONCE DAILY 30 tablet 5  . atorvastatin (LIPITOR) 80  MG tablet TAKE ONE TABLET BY MOUTH ONCE DAILY AT 6 PM 30 tablet 5  . diphenhydrAMINE (BENADRYL) 25 mg capsule Take 25 mg by mouth as needed.    . metoprolol succinate (TOPROL-XL) 25 MG 24 hr tablet TAKE ONE TABLET BY MOUTH ONCE DAILY 30 tablet 5  . potassium chloride (K-DUR) 10 MEQ tablet Take 1 tablet (10 mEq total) by mouth daily. 30 tablet 2  . warfarin (COUMADIN) 5 MG tablet Take 1 to 1.5 tablets by mouth daily as directed by coumadin clinic 45 tablet 3   No current facility-administered medications for this visit.     No Known Allergies    Review of Systems negative except from HPI and PMH  Physical Exam BP 128/64   Pulse 88   Ht 5\' 2"  (1.575 m)   Wt 171 lb 4 oz (77.7 kg)   SpO2 98%   BMI 31.32 kg/m  Well developed and well nourished in no acute distress HENT normal E scleral and icterus clear Neck Supple JVP flat; carotids brisk and full Clear to ausculation Device pocket well healed; without hematoma or erythema.  There is no tethering  Regular rate and rhythm, no murmurs gallops or rub Soft with active bowel sounds No clubbing cyanosis  Edema Alert and oriented, grossly normal motor and sensory function Skin Warm and Dry  ECG demonstrates sinus rhythm at 76 Intervals 23/07/38   Assessment and  Plan  Implantable loop recorder  Cryptogenic stroke  Atrial fibrillation   The patient is currently managed with warfarin. It is the desire for primary care physician to get the loop recorder out. She is scheduled to have carotid endarterectomy on 03/10/16. I have discussed with [name] as to undertake loop recorder explantation at the time of her endarterectomy. He is agreeable. We will arrange wound check appointment with him as well       Current medicines are reviewed at length with the patient today .  The patient does not  have concerns regarding medicines.

## 2016-02-26 ENCOUNTER — Encounter: Payer: BC Managed Care – PPO | Admitting: Internal Medicine

## 2016-02-26 LAB — CUP PACEART INCLINIC DEVICE CHECK: Date Time Interrogation Session: 20171019170229

## 2016-02-27 ENCOUNTER — Telehealth: Payer: Self-pay | Admitting: Internal Medicine

## 2016-02-27 NOTE — Telephone Encounter (Signed)
Marie Malone with Dr. Vito Berger office calling regarding surgery scheduled for patient to remove her loop 03-11-16 at 830a, [name] to follow, OR # [id number]

## 2016-03-01 NOTE — Telephone Encounter (Signed)
Colletta Maryland called back- confirmed the patient's procedure is set for 03/10/16 at 8:30 am. She just asked that I send this for pre-cert. Pre-cert dept notified.

## 2016-03-01 NOTE — Telephone Encounter (Signed)
I left a message for Colletta Maryland to call.

## 2016-03-02 ENCOUNTER — Ambulatory Visit (INDEPENDENT_AMBULATORY_CARE_PROVIDER_SITE_OTHER): Payer: BC Managed Care – PPO | Admitting: Pharmacist Clinician (PhC)/ Clinical Pharmacy Specialist

## 2016-03-02 DIAGNOSIS — I4891 Unspecified atrial fibrillation: Secondary | ICD-10-CM

## 2016-03-02 DIAGNOSIS — Z5181 Encounter for therapeutic drug level monitoring: Secondary | ICD-10-CM | POA: Diagnosis not present

## 2016-03-02 LAB — POCT INR: INR: 3

## 2016-03-02 MED ORDER — ENOXAPARIN SODIUM 80 MG/0.8ML ~~LOC~~ SOLN: 80.0000 mg | Freq: Two times a day (BID) | SUBCUTANEOUS | 0 refills | Status: DC

## 2016-03-02 NOTE — Patient Instructions (Addendum)
Enoxaprin Dosing Schedule  Enoxparin dose: 80 mg  Date  Warfarin Dose (evenings) Enoxaprin Dose  10-26 6 5  mg (1 tab)   10-27 5 0   10-28 4 0 8 am   8 pm  10-29 3 0 8 am   8 pm  10-30 2 0 8 am   8 pm  10-31 1 0 8 am  11-1 Procedure    11-2 1 7.5 mg (1.5 tabs) 8 am   8 pm  11-3 2 7.5 mg (1.5 tabs) 8 am   8 pm  11-4 3 7.5 mg (1.5 tabs) 8 am   8 pm  11-5 4 5  mg (1 tab) 8 am   8 pm  11-6 5 5  mg (1 tab) 8 am   8 pm  11-7 6 Repeat INR

## 2016-03-03 ENCOUNTER — Ambulatory Visit (INDEPENDENT_AMBULATORY_CARE_PROVIDER_SITE_OTHER): Payer: BC Managed Care – PPO | Admitting: *Deleted

## 2016-03-03 DIAGNOSIS — I6310 Cerebral infarction due to embolism of unspecified precerebral artery: Secondary | ICD-10-CM | POA: Diagnosis not present

## 2016-03-03 NOTE — Progress Notes (Signed)
Carelink Summary Report / Loop Recorder 

## 2016-03-04 ENCOUNTER — Telehealth: Payer: Self-pay | Admitting: Internal Medicine

## 2016-03-04 ENCOUNTER — Encounter (HOSPITAL_COMMUNITY): Payer: Self-pay

## 2016-03-04 ENCOUNTER — Encounter (HOSPITAL_COMMUNITY)
Admission: RE | Admit: 2016-03-04 | Discharge: 2016-03-04 | Disposition: A | Discharge status: Home / Self Care | Payer: BC Managed Care – PPO | Source: Ambulatory Visit | Attending: Surgery | Admitting: Surgery

## 2016-03-04 DIAGNOSIS — I10 Essential (primary) hypertension: Secondary | ICD-10-CM | POA: Diagnosis not present

## 2016-03-04 DIAGNOSIS — Z7901 Long term (current) use of anticoagulants: Secondary | ICD-10-CM | POA: Diagnosis not present

## 2016-03-04 DIAGNOSIS — Z87891 Personal history of nicotine dependence: Secondary | ICD-10-CM | POA: Diagnosis not present

## 2016-03-04 DIAGNOSIS — Z0183 Encounter for blood typing: Secondary | ICD-10-CM | POA: Diagnosis not present

## 2016-03-04 DIAGNOSIS — G4733 Obstructive sleep apnea (adult) (pediatric): Secondary | ICD-10-CM | POA: Diagnosis not present

## 2016-03-04 DIAGNOSIS — I4891 Unspecified atrial fibrillation: Secondary | ICD-10-CM | POA: Insufficient documentation

## 2016-03-04 DIAGNOSIS — E785 Hyperlipidemia, unspecified: Secondary | ICD-10-CM | POA: Diagnosis not present

## 2016-03-04 DIAGNOSIS — I6521 Occlusion and stenosis of right carotid artery: Secondary | ICD-10-CM | POA: Diagnosis not present

## 2016-03-04 DIAGNOSIS — Z01818 Encounter for other preprocedural examination: Secondary | ICD-10-CM | POA: Insufficient documentation

## 2016-03-04 DIAGNOSIS — Z79899 Other long term (current) drug therapy: Secondary | ICD-10-CM | POA: Diagnosis not present

## 2016-03-04 DIAGNOSIS — Z95818 Presence of other cardiac implants and grafts: Secondary | ICD-10-CM | POA: Insufficient documentation

## 2016-03-04 DIAGNOSIS — Z01812 Encounter for preprocedural laboratory examination: Secondary | ICD-10-CM | POA: Diagnosis not present

## 2016-03-04 DIAGNOSIS — Z8673 Personal history of transient ischemic attack (TIA), and cerebral infarction without residual deficits: Secondary | ICD-10-CM | POA: Diagnosis not present

## 2016-03-04 LAB — TYPE AND SCREEN
ABO/RH(D): A POS
ANTIBODY SCREEN: NEGATIVE

## 2016-03-04 LAB — COMPREHENSIVE METABOLIC PANEL
ALBUMIN: 3.7 g/dL (ref 3.5–5.0)
ALK PHOS: 87 U/L (ref 38–126)
ALT: 23 U/L (ref 14–54)
AST: 33 U/L (ref 15–41)
Anion gap: 9 (ref 5–15)
BILIRUBIN TOTAL: 0.7 mg/dL (ref 0.3–1.2)
BUN: 14 mg/dL (ref 6–20)
CO2: 23 mmol/L (ref 22–32)
Calcium: 9.3 mg/dL (ref 8.9–10.3)
Chloride: 107 mmol/L (ref 101–111)
Creatinine, Ser: 0.97 mg/dL (ref 0.44–1.00)
GFR calc Af Amer: >60 mL/min (ref >60)
GFR calc non Af Amer: >60 mL/min (ref >60)
GLUCOSE: 92 mg/dL (ref 65–99)
POTASSIUM: 3.4 mmol/L — AB (ref 3.5–5.1)
SODIUM: 139 mmol/L (ref 135–145)
TOTAL PROTEIN: 9.2 g/dL — AB (ref 6.5–8.1)

## 2016-03-04 LAB — CBC
HEMATOCRIT: 38.5 % (ref 36.0–46.0)
HEMOGLOBIN: 12.3 g/dL (ref 12.0–15.0)
MCH: 26.9 pg (ref 26.0–34.0)
MCHC: 31.9 g/dL (ref 30.0–36.0)
MCV: 84.2 fL (ref 78.0–100.0)
Platelets: 156 10*3/uL (ref 150–400)
RBC: 4.57 MIL/uL (ref 3.87–5.11)
RDW: 14.7 % (ref 11.5–15.5)
WBC: 6 10*3/uL (ref 4.0–10.5)

## 2016-03-04 LAB — ABO/RH: ABO/RH(D): A POS

## 2016-03-04 LAB — URINALYSIS, ROUTINE W REFLEX MICROSCOPIC
BILIRUBIN URINE: NEGATIVE
Glucose, UA: NEGATIVE mg/dL
HGB URINE DIPSTICK: NEGATIVE
KETONES UR: NEGATIVE mg/dL
Leukocytes, UA: NEGATIVE
NITRITE: NEGATIVE
PROTEIN: NEGATIVE mg/dL
SPECIFIC GRAVITY, URINE: 1.02 (ref 1.005–1.030)
pH: 6 (ref 5.0–8.0)

## 2016-03-04 LAB — SURGICAL PCR SCREEN
MRSA, PCR: NEGATIVE
STAPHYLOCOCCUS AUREUS: NEGATIVE

## 2016-03-04 LAB — APTT: APTT: 41 s — AB (ref 24–36)

## 2016-03-04 LAB — PROTIME-INR
INR: 2.64
Prothrombin Time: 28.7 seconds — ABNORMAL HIGH (ref 11.4–15.2)

## 2016-03-04 NOTE — Pre-Procedure Instructions (Signed)
    Marie Malone  03/04/2016      [facility] - [place], [place] - [address] [place] [address] Phone: [phone] Fax: [phone]    Your procedure is scheduled on  Wed.  Nov. 1  Report to [facility] Admitting at 6:30A.M.  Call this number if you have problems the morning of surgery:  [phone]   Remember:  Do not eat food or drink liquids after midnight.  Take these medicines the morning of surgery with A SIP OF WATER : tylenol if needed, amlodipine, aspirin, metoprolol              Stop motrin, advil, ibuprofen, aleve,BC Powders, goody's.   Do not wear jewelry, make-up or nail polish.  Do not wear lotions, powders, or perfumes, or deoderant.  Do not shave 48 hours prior to surgery.  Men may shave face and neck.  Do not bring valuables to the hospital.  [facility] is not responsible for any belongings or valuables.  Contacts, dentures or bridgework may not be worn into surgery.  Leave your suitcase in the car.  After surgery it may be brought to your room.  For patients admitted to the hospital, discharge time will be determined by your treatment team.  Patients discharged the day of surgery will not be allowed to drive home.    Special instructions: review preparing for surgery  Please read over the following fact sheets that you were given. Coughing and Deep Breathing and MRSA Information

## 2016-03-04 NOTE — Progress Notes (Signed)
PCP;Dr. Debbrah Alar Cardiologist: Der. Crenshaw, [name]  Pt. States she is to have loop recorded removed by [name]. No orders in epic from him. Called his office requesting orders.  Pt. States she is to continue taking aspirin up to day of surgery and to stop coumadin  Last dose today and will start lovenox on 03/06/16.

## 2016-03-04 NOTE — Telephone Encounter (Signed)
Will forward to [name] to place orders upon his return.  He will not be back in the office until 03/08/16.

## 2016-03-04 NOTE — Telephone Encounter (Signed)
Pt is scheduled to have loop recorder removed on 11/1, there are no orders placed for this procedure

## 2016-03-05 NOTE — Progress Notes (Signed)
Anesthesia Chart Review: Patient is a 65 year old female scheduled for removal of loop recorder ([name]) followed by right carotid endarterectomy ([name]) on 03/10/16.  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 Reports she is difficult to awaken from anesthesia.  Of note on TEE (following CVA) 04/10/13 there was a small oscillating on anterior MV leaflet ofuncertain etiology (cannot R/O vegetation) and a small oscillation density on aortic valve most likey Lambl's excresence. ID involved and blood culture negative but she got PICC line and finished off IV antibiotics for 6 weeks. Loop recorder showed afib, and she was started on anticoagulation therapy.  PCP is Debbrah Alar, NP. Primary cardiologist is [name]. PV cardiologist is Dr. Gwenlyn Found.  EP Cardiologist is [name].  Meds include amlodipine, ASA 81 mg (start 7 days prior to surgery), Lipitor, Lovenox (start 03/06/16), warfarin (hold 5 days prior to surgery; held 03/04/16), KCL, Toprol XL.   02/24/16 EKG: SR with first degree AV block.  01/27/16 Nuclear stress test:  The left ventricular ejection fraction is normal (55-65%).  Nuclear stress EF: 58%.  There was no ST segment deviation noted during stress.  No T wave inversion was noted during stress.  The study is normal.  This is a low risk study.  12/30/15 Echo: Study Conclusions - Left ventricle: The cavity size was normal. Systolic function was   normal. The estimated ejection fraction was in the range of 55%   to 60%. Wall motion was normal; there were no regional wall   motion abnormalities. Doppler parameters are consistent with high   ventricular filling pressure. - Aortic valve: Severe diffuse thickening and calcification. - Mitral valve: S/P mitral valvuloplasty with  severely thickened   mitral valve leaflets with significantly reduced leaflet   mobility. There is significant calcification of the chordal   structures and subvalvular apparatus. Findings consistent with   moderate mitral stenosis. The mean MV gradient is 4mmHg. There is   moderate MR with ERO of 0.24 and regurgitant volume 36cc.   Severely calcified annulus. Severe diffuse thickening of the   anterior leaflet and posterior leaflet. Mobility was restricted.   The findings are consistent with moderate stenosis. There was   moderate regurgitation. Valve area by pressure half-time: 1.96   cm^2. Valve area by continuity equation (using LVOT flow): 0.7   cm^2. - Left atrium: The atrium was severely dilated. - Pulmonary arteries: PA peak pressure: 36 mm Hg (S). - Impressions: Compared to study 12/2014, the mitral stenosis has   progressed and is now moderate. The mean mitral valve gradient   has increased from 61mmHg to 4mmHg . Mitral regurgitation is now   moderate. The right ventricular systolic pressure was increased   consistent with mild pulmonary hypertension.  11/01/06 Cardiac catheterization [facility]):  Right dominant Coronary WNL Normal LV systolic function Trivial MR Moderate to severe mitral stenosis, MVA 1.1 cm Severe pulmonary hypertension (Patient s/p MV annuloplasty 02/02/07.)  01/29/16 CTA neck: IMPRESSION: 1. Positive for RADIOGRAPHIC STRING SIGN STENOSIS affecting the proximal 8 mm of the right ICA. See series 10, image 70. 2. Mild left carotid bifurcation atherosclerosis without stenosis. 3. Calcified plaque of the right vertebral artery V1 segment with mild stenosis. Soft plaque with mild stenosis at the left vertebral artery origin. 4.  Calcified aortic atherosclerosis. 5. 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  01/01/16 Carotid U/S:  Progression to 60-79% RICA stenosis, hight end of scale. By color Doppler, flow is string-scale over 2-3 cms, and velocities may underestimate the severity of stenosis. Stable 0-14% LICA stenosis. > 50% RECA stenosis. Normal subclavian arteries bilaterally. Patent vertebral arteries with antegrade flow. Recommend CTA to further assess "string" sign.  10/07/15 CXR: IMPRESSION: 1. Cardiomegaly.  Question mild pulmonary vascular congestion. 2. No pneumonia or effusion is seen.  Preoperative labs noted. UA WNL. K 3.4. Cr 0.97. CBC WNL. PTT 41. INR 2.64. Plan repeat PT/PTT on arrival.  If same day labs acceptable and otherwise no acute changes then I anticipate that she can proceed as planned.  [facility] Short Stay Center/Anesthesiology Phone [phone] 03/05/2016 1:50 PM

## 2016-03-07 ENCOUNTER — Other Ambulatory Visit: Payer: Self-pay | Admitting: Internal Medicine

## 2016-03-07 DIAGNOSIS — Z959 Presence of cardiac and vascular implant and graft, unspecified: Secondary | ICD-10-CM

## 2016-03-10 ENCOUNTER — Inpatient Hospital Stay (HOSPITAL_COMMUNITY): Payer: BC Managed Care – PPO | Admitting: Vascular Surgery

## 2016-03-10 ENCOUNTER — Encounter (HOSPITAL_COMMUNITY)
Admission: RE | Disposition: A | Discharge status: Home / Self Care | Payer: Self-pay | Source: Ambulatory Visit | Attending: Surgery

## 2016-03-10 ENCOUNTER — Encounter (HOSPITAL_COMMUNITY): Payer: Self-pay | Admitting: *Deleted

## 2016-03-10 ENCOUNTER — Inpatient Hospital Stay (HOSPITAL_COMMUNITY)
Admission: RE | Admit: 2016-03-10 | Discharge: 2016-03-11 | DRG: 039 | Disposition: A | Discharge status: Home / Self Care | Payer: BC Managed Care – PPO | Source: Ambulatory Visit | Attending: Surgery | Admitting: Surgery

## 2016-03-10 DIAGNOSIS — I4891 Unspecified atrial fibrillation: Secondary | ICD-10-CM | POA: Diagnosis present

## 2016-03-10 DIAGNOSIS — I10 Essential (primary) hypertension: Secondary | ICD-10-CM | POA: Diagnosis present

## 2016-03-10 DIAGNOSIS — Z833 Family history of diabetes mellitus: Secondary | ICD-10-CM

## 2016-03-10 DIAGNOSIS — Z959 Presence of cardiac and vascular implant and graft, unspecified: Secondary | ICD-10-CM

## 2016-03-10 DIAGNOSIS — Z87891 Personal history of nicotine dependence: Secondary | ICD-10-CM

## 2016-03-10 DIAGNOSIS — G4733 Obstructive sleep apnea (adult) (pediatric): Secondary | ICD-10-CM | POA: Diagnosis present

## 2016-03-10 DIAGNOSIS — E7800 Pure hypercholesterolemia, unspecified: Secondary | ICD-10-CM | POA: Diagnosis present

## 2016-03-10 DIAGNOSIS — Z823 Family history of stroke: Secondary | ICD-10-CM | POA: Diagnosis not present

## 2016-03-10 DIAGNOSIS — I6521 Occlusion and stenosis of right carotid artery: Secondary | ICD-10-CM | POA: Diagnosis present

## 2016-03-10 DIAGNOSIS — Z7901 Long term (current) use of anticoagulants: Secondary | ICD-10-CM | POA: Diagnosis not present

## 2016-03-10 DIAGNOSIS — E785 Hyperlipidemia, unspecified: Secondary | ICD-10-CM | POA: Diagnosis present

## 2016-03-10 DIAGNOSIS — Z8249 Family history of ischemic heart disease and other diseases of the circulatory system: Secondary | ICD-10-CM | POA: Diagnosis not present

## 2016-03-10 DIAGNOSIS — Z8673 Personal history of transient ischemic attack (TIA), and cerebral infarction without residual deficits: Secondary | ICD-10-CM

## 2016-03-10 HISTORY — PX: ENDARTERECTOMY: SHX5162

## 2016-03-10 HISTORY — PX: PATCH ANGIOPLASTY: SHX6230

## 2016-03-10 LAB — APTT: aPTT: 34 seconds (ref 24–36)

## 2016-03-10 LAB — PROTIME-INR
INR: 1.25
PROTHROMBIN TIME: 15.8 s — AB (ref 11.4–15.2)

## 2016-03-10 SURGERY — ENDARTERECTOMY, CAROTID
Anesthesia: General | Site: Neck | Laterality: Right

## 2016-03-10 MED ORDER — SODIUM CHLORIDE 0.9 % IV SOLN
INTRAVENOUS | Status: DC
  Administered 2016-03-10: 50 mL/h via INTRAVENOUS

## 2016-03-10 MED ORDER — SODIUM CHLORIDE 0.9 % IV SOLN: INTRAVENOUS | Status: DC

## 2016-03-10 MED ORDER — ONDANSETRON HCL 4 MG/2ML IJ SOLN: 4.0000 mg | Freq: Four times a day (QID) | INTRAMUSCULAR | Status: DC | PRN

## 2016-03-10 MED ORDER — SODIUM CHLORIDE 0.9 % IV SOLN
INTRAVENOUS | Status: DC | PRN
  Administered 2016-03-10: 08:00:00

## 2016-03-10 MED ORDER — METOPROLOL TARTRATE 5 MG/5ML IV SOLN: 2.0000 mg | INTRAVENOUS | Status: DC | PRN

## 2016-03-10 MED ORDER — SODIUM CHLORIDE 0.9 % IR SOLN: 80.0000 mg | Status: DC

## 2016-03-10 MED ORDER — HEPARIN SODIUM (PORCINE) 1000 UNIT/ML IJ SOLN
INTRAMUSCULAR | Status: AC
  Filled 2016-03-10: qty 1

## 2016-03-10 MED ORDER — AMLODIPINE BESYLATE 10 MG PO TABS
10.0000 mg | ORAL_TABLET | Freq: Every day | ORAL | Status: DC
  Administered 2016-03-11: 10 mg via ORAL
  Filled 2016-03-10: qty 1

## 2016-03-10 MED ORDER — LABETALOL HCL 5 MG/ML IV SOLN: 10.0000 mg | INTRAVENOUS | Status: DC | PRN

## 2016-03-10 MED ORDER — ROCURONIUM BROMIDE 10 MG/ML (PF) SYRINGE
PREFILLED_SYRINGE | INTRAVENOUS | Status: AC
  Filled 2016-03-10: qty 10

## 2016-03-10 MED ORDER — OXYCODONE HCL 5 MG/5ML PO SOLN: 5.0000 mg | Freq: Once | ORAL | Status: DC | PRN

## 2016-03-10 MED ORDER — ACETAMINOPHEN 325 MG PO TABS
650.0000 mg | ORAL_TABLET | ORAL | Status: DC | PRN
  Administered 2016-03-10: 650 mg via ORAL
  Filled 2016-03-10: qty 2

## 2016-03-10 MED ORDER — SODIUM CHLORIDE 0.9 % IV SOLN
500.0000 mL | Freq: Once | INTRAVENOUS | Status: AC | PRN
  Administered 2016-03-10: 500 mL via INTRAVENOUS

## 2016-03-10 MED ORDER — DIPHENHYDRAMINE HCL 25 MG PO CAPS: 25.0000 mg | ORAL_CAPSULE | Freq: Four times a day (QID) | ORAL | Status: DC | PRN

## 2016-03-10 MED ORDER — PANTOPRAZOLE SODIUM 40 MG PO TBEC
40.0000 mg | DELAYED_RELEASE_TABLET | Freq: Every day | ORAL | Status: DC
  Administered 2016-03-11: 40 mg via ORAL
  Filled 2016-03-10: qty 1

## 2016-03-10 MED ORDER — DEXTROSE 5 % IV SOLN
1.5000 g | INTRAVENOUS | Status: AC
  Administered 2016-03-10: 1.5 g via INTRAVENOUS

## 2016-03-10 MED ORDER — OXYCODONE HCL 5 MG PO TABS: 5.0000 mg | ORAL_TABLET | Freq: Once | ORAL | Status: DC | PRN

## 2016-03-10 MED ORDER — DOCUSATE SODIUM 100 MG PO CAPS
100.0000 mg | ORAL_CAPSULE | Freq: Every day | ORAL | Status: DC
  Administered 2016-03-11: 100 mg via ORAL
  Filled 2016-03-10: qty 1

## 2016-03-10 MED ORDER — FENTANYL CITRATE (PF) 100 MCG/2ML IJ SOLN
INTRAMUSCULAR | Status: AC
  Administered 2016-03-10: 25 ug via INTRAVENOUS
  Filled 2016-03-10: qty 2

## 2016-03-10 MED ORDER — CHLORHEXIDINE GLUCONATE CLOTH 2 % EX PADS: 6.0000 | MEDICATED_PAD | Freq: Once | CUTANEOUS | Status: DC

## 2016-03-10 MED ORDER — PROPOFOL 10 MG/ML IV BOLUS
INTRAVENOUS | Status: AC
  Filled 2016-03-10: qty 20

## 2016-03-10 MED ORDER — MIDAZOLAM HCL 5 MG/5ML IJ SOLN
INTRAMUSCULAR | Status: DC | PRN
  Administered 2016-03-10: 2 mg via INTRAVENOUS

## 2016-03-10 MED ORDER — OXYCODONE-ACETAMINOPHEN 5-325 MG PO TABS
ORAL_TABLET | ORAL | Status: AC
  Filled 2016-03-10: qty 2

## 2016-03-10 MED ORDER — REMIFENTANIL HCL 1 MG IV SOLR
0.0125 ug/kg/min | INTRAVENOUS | Status: AC
  Administered 2016-03-10: .1 ug/kg/min via INTRAVENOUS
  Filled 2016-03-10: qty 2000

## 2016-03-10 MED ORDER — ASPIRIN EC 81 MG PO TBEC
81.0000 mg | DELAYED_RELEASE_TABLET | Freq: Every day | ORAL | Status: DC
  Administered 2016-03-11: 81 mg via ORAL
  Filled 2016-03-10: qty 1

## 2016-03-10 MED ORDER — GLYCOPYRROLATE 0.2 MG/ML IJ SOLN
INTRAMUSCULAR | Status: DC | PRN
  Administered 2016-03-10: 0.2 mg via INTRAVENOUS

## 2016-03-10 MED ORDER — ALUM & MAG HYDROXIDE-SIMETH 200-200-20 MG/5ML PO SUSP: 15.0000 mL | ORAL | Status: DC | PRN

## 2016-03-10 MED ORDER — LIDOCAINE 2% (20 MG/ML) 5 ML SYRINGE
INTRAMUSCULAR | Status: AC
  Filled 2016-03-10: qty 5

## 2016-03-10 MED ORDER — HYDRALAZINE HCL 20 MG/ML IJ SOLN: 5.0000 mg | INTRAMUSCULAR | Status: DC | PRN

## 2016-03-10 MED ORDER — ONDANSETRON HCL 4 MG/2ML IJ SOLN
INTRAMUSCULAR | Status: AC
  Filled 2016-03-10: qty 2

## 2016-03-10 MED ORDER — MORPHINE SULFATE (PF) 4 MG/ML IV SOLN
INTRAVENOUS | Status: AC
  Administered 2016-03-10: 4 mg
  Filled 2016-03-10: qty 1

## 2016-03-10 MED ORDER — GUAIFENESIN-DM 100-10 MG/5ML PO SYRP: 15.0000 mL | ORAL_SOLUTION | ORAL | Status: DC | PRN

## 2016-03-10 MED ORDER — SUGAMMADEX SODIUM 200 MG/2ML IV SOLN
INTRAVENOUS | Status: DC | PRN
  Administered 2016-03-10: 150 mg via INTRAVENOUS

## 2016-03-10 MED ORDER — PHENYLEPHRINE HCL 10 MG/ML IJ SOLN
INTRAMUSCULAR | Status: DC | PRN
  Administered 2016-03-10: 30 ug/min via INTRAVENOUS

## 2016-03-10 MED ORDER — LACTATED RINGERS IV SOLN
INTRAVENOUS | Status: DC | PRN
  Administered 2016-03-10 (×2): via INTRAVENOUS

## 2016-03-10 MED ORDER — HEMOSTATIC AGENTS (NO CHARGE) OPTIME
TOPICAL | Status: DC | PRN
  Administered 2016-03-10: 1 via TOPICAL

## 2016-03-10 MED ORDER — ONDANSETRON HCL 4 MG/2ML IJ SOLN
INTRAMUSCULAR | Status: DC | PRN
  Administered 2016-03-10: 4 mg via INTRAVENOUS

## 2016-03-10 MED ORDER — PHENOL 1.4 % MT LIQD: 1.0000 | OROMUCOSAL | Status: DC | PRN

## 2016-03-10 MED ORDER — LIDOCAINE HCL (PF) 1 % IJ SOLN
INTRAMUSCULAR | Status: AC
  Filled 2016-03-10: qty 30

## 2016-03-10 MED ORDER — METOPROLOL SUCCINATE ER 25 MG PO TB24
25.0000 mg | ORAL_TABLET | Freq: Every day | ORAL | Status: DC
  Filled 2016-03-10: qty 1

## 2016-03-10 MED ORDER — FENTANYL CITRATE (PF) 100 MCG/2ML IJ SOLN
INTRAMUSCULAR | Status: AC
  Filled 2016-03-10: qty 2

## 2016-03-10 MED ORDER — DEXTROSE 5 % IV SOLN
1.5000 g | Freq: Two times a day (BID) | INTRAVENOUS | Status: AC
  Administered 2016-03-10 – 2016-03-11 (×2): 1.5 g via INTRAVENOUS
  Filled 2016-03-10 (×2): qty 1.5

## 2016-03-10 MED ORDER — FENTANYL CITRATE (PF) 100 MCG/2ML IJ SOLN
25.0000 ug | INTRAMUSCULAR | Status: DC | PRN
  Administered 2016-03-10: 25 ug via INTRAVENOUS
  Administered 2016-03-10: 50 ug via INTRAVENOUS
  Administered 2016-03-10: 25 ug via INTRAVENOUS

## 2016-03-10 MED ORDER — POTASSIUM CHLORIDE ER 10 MEQ PO TBCR
10.0000 meq | EXTENDED_RELEASE_TABLET | Freq: Every day | ORAL | Status: DC
  Administered 2016-03-10 – 2016-03-11 (×2): 10 meq via ORAL
  Filled 2016-03-10 (×4): qty 1

## 2016-03-10 MED ORDER — CEFAZOLIN SODIUM-DEXTROSE 2-4 GM/100ML-% IV SOLN
INTRAVENOUS | Status: AC
  Filled 2016-03-10: qty 100

## 2016-03-10 MED ORDER — MORPHINE SULFATE (PF) 2 MG/ML IV SOLN
2.0000 mg | INTRAVENOUS | Status: DC | PRN
  Administered 2016-03-11: 4 mg via INTRAVENOUS
  Filled 2016-03-10: qty 2

## 2016-03-10 MED ORDER — HEPARIN SODIUM (PORCINE) 1000 UNIT/ML IJ SOLN
INTRAMUSCULAR | Status: DC | PRN
  Administered 2016-03-10: 7.5 mL via INTRAVENOUS

## 2016-03-10 MED ORDER — CEFAZOLIN SODIUM-DEXTROSE 2-4 GM/100ML-% IV SOLN: 2.0000 g | INTRAVENOUS | Status: DC

## 2016-03-10 MED ORDER — FENTANYL CITRATE (PF) 100 MCG/2ML IJ SOLN
INTRAMUSCULAR | Status: DC | PRN
  Administered 2016-03-10: 100 ug via INTRAVENOUS

## 2016-03-10 MED ORDER — DEXTROSE 5 % IV SOLN
INTRAVENOUS | Status: AC
  Filled 2016-03-10: qty 1.5

## 2016-03-10 MED ORDER — ATORVASTATIN CALCIUM 80 MG PO TABS
80.0000 mg | ORAL_TABLET | Freq: Every day | ORAL | Status: DC
  Administered 2016-03-10: 80 mg via ORAL
  Filled 2016-03-10: qty 1

## 2016-03-10 MED ORDER — POTASSIUM CHLORIDE CRYS ER 20 MEQ PO TBCR
20.0000 meq | EXTENDED_RELEASE_TABLET | Freq: Every day | ORAL | Status: DC | PRN
Start: 2016-03-10 — End: 2016-03-11

## 2016-03-10 MED ORDER — EPHEDRINE SULFATE 50 MG/ML IJ SOLN
INTRAMUSCULAR | Status: DC | PRN
  Administered 2016-03-10: 10 mg via INTRAVENOUS

## 2016-03-10 MED ORDER — MAGNESIUM SULFATE 2 GM/50ML IV SOLN: 2.0000 g | Freq: Every day | INTRAVENOUS | Status: DC | PRN

## 2016-03-10 MED ORDER — SUGAMMADEX SODIUM 200 MG/2ML IV SOLN
INTRAVENOUS | Status: AC
  Filled 2016-03-10: qty 2

## 2016-03-10 MED ORDER — OXYCODONE-ACETAMINOPHEN 5-325 MG PO TABS
1.0000 | ORAL_TABLET | ORAL | Status: DC | PRN
  Administered 2016-03-10 (×2): 2 via ORAL
  Filled 2016-03-10: qty 2

## 2016-03-10 MED ORDER — OXYCODONE-ACETAMINOPHEN 5-325 MG PO TABS
ORAL_TABLET | ORAL | Status: AC
  Administered 2016-03-10: 2 via ORAL
  Filled 2016-03-10: qty 2

## 2016-03-10 MED ORDER — LIDOCAINE HCL (CARDIAC) 20 MG/ML IV SOLN
INTRAVENOUS | Status: DC | PRN
  Administered 2016-03-10: 40 mg via INTRAVENOUS

## 2016-03-10 MED ORDER — PROPOFOL 10 MG/ML IV BOLUS
INTRAVENOUS | Status: DC | PRN
Start: 2016-03-10 — End: 2016-03-10
  Administered 2016-03-10: 140 mg via INTRAVENOUS

## 2016-03-10 MED ORDER — PROTAMINE SULFATE 10 MG/ML IV SOLN
INTRAVENOUS | Status: AC
  Filled 2016-03-10: qty 5

## 2016-03-10 MED ORDER — PROTAMINE SULFATE 10 MG/ML IV SOLN
INTRAVENOUS | Status: DC | PRN
  Administered 2016-03-10: 50 mg via INTRAVENOUS

## 2016-03-10 MED ORDER — ONDANSETRON HCL 4 MG/2ML IJ SOLN: 4.0000 mg | Freq: Once | INTRAMUSCULAR | Status: DC | PRN

## 2016-03-10 MED ORDER — MIDAZOLAM HCL 2 MG/2ML IJ SOLN
INTRAMUSCULAR | Status: AC
  Filled 2016-03-10: qty 2

## 2016-03-10 MED ORDER — ROCURONIUM BROMIDE 100 MG/10ML IV SOLN
INTRAVENOUS | Status: DC | PRN
  Administered 2016-03-10: 50 mg via INTRAVENOUS

## 2016-03-10 MED ORDER — 0.9 % SODIUM CHLORIDE (POUR BTL) OPTIME
TOPICAL | Status: DC | PRN
  Administered 2016-03-10: 2000 mL

## 2016-03-10 SURGICAL SUPPLY — 41 items
CANISTER SUCTION 2500CC (MISCELLANEOUS) ×4 IMPLANT
CATH ROBINSON RED A/P 18FR (CATHETERS) ×4 IMPLANT
CATH SUCT 10FR WHISTLE TIP (CATHETERS) ×4 IMPLANT
CLIP TI MEDIUM 6 (CLIP) ×4 IMPLANT
CLIP TI WIDE RED SMALL 6 (CLIP) ×4 IMPLANT
CRADLE DONUT ADULT HEAD (MISCELLANEOUS) ×4 IMPLANT
DERMABOND ADVANCED (GAUZE/BANDAGES/DRESSINGS) ×2
DERMABOND ADVANCED .7 DNX12 (GAUZE/BANDAGES/DRESSINGS) ×2 IMPLANT
DRAIN CHANNEL 15F RND FF W/TCR (WOUND CARE) IMPLANT
ELECT REM PT RETURN 9FT ADLT (ELECTROSURGICAL) ×4
ELECTRODE REM PT RTRN 9FT ADLT (ELECTROSURGICAL) ×2 IMPLANT
EVACUATOR SILICONE 100CC (DRAIN) IMPLANT
GLOVE BIO SURGEON STRL SZ 6.5 (GLOVE) ×9 IMPLANT
GLOVE BIO SURGEONS STRL SZ 6.5 (GLOVE) ×3
GLOVE BIOGEL PI IND STRL 6.5 (GLOVE) ×6 IMPLANT
GLOVE BIOGEL PI IND STRL 7.0 (GLOVE) ×2 IMPLANT
GLOVE BIOGEL PI IND STRL 7.5 (GLOVE) ×2 IMPLANT
GLOVE BIOGEL PI INDICATOR 6.5 (GLOVE) ×6
GLOVE BIOGEL PI INDICATOR 7.0 (GLOVE) ×2
GLOVE BIOGEL PI INDICATOR 7.5 (GLOVE) ×2
GLOVE SURG SS PI 7.5 STRL IVOR (GLOVE) ×4 IMPLANT
GOWN STRL REUS W/ TWL LRG LVL3 (GOWN DISPOSABLE) ×6 IMPLANT
GOWN STRL REUS W/ TWL XL LVL3 (GOWN DISPOSABLE) ×2 IMPLANT
GOWN STRL REUS W/TWL LRG LVL3 (GOWN DISPOSABLE) ×6
GOWN STRL REUS W/TWL XL LVL3 (GOWN DISPOSABLE) ×2
HEMOSTAT SNOW SURGICEL 2X4 (HEMOSTASIS) ×4 IMPLANT
KIT BASIN OR (CUSTOM PROCEDURE TRAY) ×4 IMPLANT
KIT ROOM TURNOVER OR (KITS) ×4 IMPLANT
NS IRRIG 1000ML POUR BTL (IV SOLUTION) ×12 IMPLANT
PACK CAROTID (CUSTOM PROCEDURE TRAY) ×4 IMPLANT
PAD ARMBOARD 7.5X6 YLW CONV (MISCELLANEOUS) ×8 IMPLANT
PATCH VASC XENOSURE 1CMX6CM (Vascular Products) ×2 IMPLANT
PATCH VASC XENOSURE 1X6 (Vascular Products) ×2 IMPLANT
SPONGE INTESTINAL PEANUT (DISPOSABLE) ×4 IMPLANT
SUT ETHILON 3 0 PS 1 (SUTURE) IMPLANT
SUT PROLENE 6 0 BV (SUTURE) ×8 IMPLANT
SUT PROLENE 7 0 BV 1 (SUTURE) IMPLANT
SUT VIC AB 3-0 SH 27 (SUTURE) ×4
SUT VIC AB 3-0 SH 27X BRD (SUTURE) ×4 IMPLANT
SUT VICRYL 4-0 PS2 18IN ABS (SUTURE) ×4 IMPLANT
WATER STERILE IRR 1000ML POUR (IV SOLUTION) ×4 IMPLANT

## 2016-03-10 NOTE — Progress Notes (Signed)
  Day of Surgery Note    Subjective:  Sleepy-awakes to follow commands  Vitals:   03/10/16 1053 03/10/16 1100  BP: 116/65 104/75  Pulse: 82 66  Resp: 15 14  Temp: 98 F (36.7 C)     Incisions:   Clean and dry Extremities:  Moving all extremities equally Cardiac:  regular Lungs:  Non labored Neuro:  In tact; tongue is midline   Assessment/Plan:  This is a 65 y.o. female who is s/p   right carotid Endarterectomy with bovine pericardial patch angioplasty  -pt doing well in pacu-still sleepy, but wakes and follows commands. -neuro in tact -to [place] when bed available   Leontine Locket, PA-C 03/10/2016 11:45 AM [phone]

## 2016-03-10 NOTE — Anesthesia Preprocedure Evaluation (Addendum)
Anesthesia Evaluation  Patient identified by MRN, date of birth, ID band Patient awake    Reviewed: Allergy & Precautions, NPO status , Patient's Chart, lab work & pertinent test results  Airway Mallampati: II  TM Distance: >3 FB Neck ROM: Full    Dental  (+) Partial Upper, Dental Advisory Given   Pulmonary former smoker,    breath sounds clear to auscultation       Cardiovascular hypertension,  Rhythm:Regular Rate:Normal     Neuro/Psych    GI/Hepatic   Endo/Other    Renal/GU      Musculoskeletal   Abdominal   Peds  Hematology   Anesthesia Other Findings   Reproductive/Obstetrics                            Anesthesia Physical Anesthesia Plan  ASA: III  Anesthesia Plan: General   Post-op Pain Management:    Induction: Intravenous  Airway Management Planned: Oral ETT  Additional Equipment: Arterial line  Intra-op Plan:   Post-operative Plan: Extubation in OR  Informed Consent: I have reviewed the patients History and Physical, chart, labs and discussed the procedure including the risks, benefits and alternatives for the proposed anesthesia with the patient or authorized representative who has indicated his/her understanding and acceptance.   Dental advisory given  Plan Discussed with: CRNA and Anesthesiologist  Anesthesia Plan Comments:         Anesthesia Quick Evaluation

## 2016-03-10 NOTE — H&P (View-Only) (Signed)
[facility]  Patient name: Marie Malone MRN: [id number] DOB: [birth year] Sex: female  REFERRING PHYSICIAN: Dr. Gwenlyn Found  REASON FOR CONSULT: Carotid stenosis  HPI: Marie Malone is a 65 y.o. female, who is referred for management of a high grade asymptomatic right carotid stenosis.  The patient underwent u/s testing which revealed 60-79% stenosis (at the top of the range) which led to a CTA of the neck revealing a radiographic string sign.  She denies any current symptoms.  She does have a history of a stroke in the remote past in 2014 where she developed slurred speech or facial droop and right arm weakness.  She will occasionally get fatigue in her right arm.  She will occasionally slower her words.  She has never had any symptoms affecting the left side of her body.  She denies amaurosis.  The patient has a history of rheumatic heart disease and underwent valvuloplasty at [facility] in 2008.  She is medically managed for hypercholesterolemia with a statin.  She is treated for hypertension.  She is a former smoker.  She does take Coumadin for atrial fibrillation  Past Medical History:  Diagnosis Date  . Atrial fibrillation ([employer])   . Complication of anesthesia    difficult to wake up from anesthesia  . Heart murmur   . History of rheumatic fever    as a child  . Hyperlipidemia   . Hypertension   . Mitral stenosis   . OSA (obstructive sleep apnea)    mild per sleep study 2008  . Pneumonia    double pneumonia once  . Stroke [facility])     Family History  Problem Relation Age of Onset  . Diabetes Mother   . Stroke Mother   . Diabetes Father   . Heart disease Father     CHF  . Cancer Father     kidney  . Diabetes Sister   . Diabetes Brother   . Hypertension Daughter     SOCIAL HISTORY: Social History   Social History  . Marital status: Married    Spouse name: N/A  . Number of children: 2  . Years of education:  Associates   Occupational History  . Not on file.   Social History Main Topics  . Smoking status: Former Smoker    Packs/day: 1.00    Years: 5.00    Types: Cigarettes    Start date: 07/15/1970    Quit date: 05/11/1975  . Smokeless tobacco: Never Used     Comment: quit smoking 36 years ago  . Alcohol use No     Comment: drank years ago when young  . Drug use: No  . Sexual activity: Not on file   Other Topics Concern  . Not on file   Social History Narrative   Retired Archivist   Married   She has 2 grown children-   Daughter lives in [place]   Son lives in [place]   6 grandchildren        No Known Allergies  Current Outpatient Prescriptions  Medication Sig Dispense Refill  . acetaminophen (TYLENOL) 325 MG tablet Take 650 mg by mouth as needed.    [name] Kitchen amLODipine (NORVASC) 10 MG tablet TAKE ONE TABLET BY MOUTH ONCE DAILY 30 tablet 5  . atorvastatin (LIPITOR) 80 MG tablet TAKE ONE TABLET BY MOUTH ONCE DAILY AT 6 PM 30 tablet 5  . diphenhydrAMINE (BENADRYL) 25 mg capsule Take 25 mg by mouth as  needed.    . metoprolol succinate (TOPROL-XL) 25 MG 24 hr tablet TAKE ONE TABLET BY MOUTH ONCE DAILY 30 tablet 5  . potassium chloride (K-DUR) 10 MEQ tablet Take 1 tablet (10 mEq total) by mouth daily. 30 tablet 2  . warfarin (COUMADIN) 5 MG tablet Take 1 to 1.5 tablets by mouth daily as directed by coumadin clinic 45 tablet 3   No current facility-administered medications for this visit.     REVIEW OF SYSTEMS:  [X]  denotes positive finding, [ ]  denotes negative finding Cardiac  Comments:  Chest pain or chest pressure:    Shortness of breath upon exertion:    Short of breath when lying flat:    Irregular heart rhythm: x       Vascular    Pain in calf, thigh, or hip brought on by ambulation:    Pain in feet at night that wakes you up from your sleep:  x   Blood clot in your veins:    Leg swelling:         Pulmonary    Oxygen at home:    Productive cough:       Wheezing:         Neurologic    Sudden weakness in arms or legs:     Sudden numbness in arms or legs:     Sudden onset of difficulty speaking or slurred speech:    Temporary loss of vision in one eye:     Problems with dizziness:         Gastrointestinal    Blood in stool:     Vomited blood:         Genitourinary    Burning when urinating:     Blood in urine:        Psychiatric    Major depression:         Hematologic    Bleeding problems:    Problems with blood clotting too easily:        Skin    Rashes or ulcers:        Constitutional    Fever or chills:      PHYSICAL EXAM: Vitals:   02/11/16 0933 02/11/16 0935  BP: 129/73 (!) 154/75  Pulse: 68 70  Temp:  98.2 F (36.8 C)  TempSrc:  Oral  Weight:  169 lb (76.7 kg)  Height:  5\' 2"  (1.575 m)    GENERAL: The patient is a well-nourished female, in no acute distress. The vital signs are documented above. CARDIAC: There is a regular rate and rhythm.  VASCULAR: No carotid bruits PULMONARY: There is good air exchange bilaterally without wheezing or rales. ABDOMEN: Soft and non-tender with normal pitched bowel sounds.  MUSCULOSKELETAL: There are no major deformities or cyanosis. NEUROLOGIC: No focal weakness or paresthesias are detected. SKIN: There are no ulcers or rashes noted. PSYCHIATRIC: The patient has a normal affect.  DATA:  I have reviewed her ultrasound studies as well as her CT angiogram which reveals a high-grade right carotid stenosis.  The bifurcation is in the mid neck.  ASSESSMENT AND PLAN: The matted right carotid stenosis.  We discussed proceeding with right carotid endarterectomy.  I discussed the risks and benefits of the procedure with the patient as well as with her daughter via telephone.  We discussed risks of stroke, nerve injury, cardiopulmonary complications.  All other questions were answered.  Her operations been scheduled for Wednesday, November 1.  The patient takes Coumadin for  atrial fibrillation.  I suspect that this was the likely cause of her stroke several years ago and therefore I would recommend bridging her with Lovenox.  I would also like for her to begin a baby aspirin starting one week prior to her operation.   [name], MD [facility] [phone] Pager [phone]

## 2016-03-10 NOTE — Interval H&P Note (Signed)
History and Physical Interval Note:  03/10/2016 8:25 AM  Marie Malone  has presented today for surgery, with the diagnosis of Right carotid artery stenosis I65.21  The various methods of treatment have been discussed with the patient and family. After consideration of risks, benefits and other options for treatment, the patient has consented to  Procedure(s) with comments: ENDARTERECTOMY CAROTID (Right) - Caryl Comes will go first [place] (N/A) as a surgical intervention .  The patient's history has been reviewed, patient examined, no change in status, stable for surgery.  I have reviewed the patient's chart and labs.  Questions were answered to the patient's satisfaction.     [name]

## 2016-03-10 NOTE — Op Note (Signed)
Patient name: Marie Malone MRN: [id number] DOB: 12-29-1950 Sex: female  03/10/2016 Pre-operative Diagnosis: Asymptomatic   right carotid stenosis Post-operative diagnosis:  Same Surgeon:  [name] Assistants:  Silva Bandy Procedure:    right carotid Endarterectomy with bovine pericardial patch angioplasty Anesthesia:  General Blood Loss:  See anesthesia record Specimens:  Carotid Plaque to pathology  Findings:  80 %stenosis; Thrombus:  none  Indications:  65 year old with history of CVA, likely secondary to AFIB, now with stenosis by CTA as a string sign.  Procedure:  The patient was identified in the holding area and taken to [place] 10  The patient was then placed supine on the table.   General endotrachial anesthesia was administered.  The patient was prepped and draped in the usual sterile fashion.  A time out was called and antibiotics were administered.  The incision was made along the anterior border of the right sternocleidomastoid muscle.  Cautery was used to dissect through the subcutaneous tissue.  The platysma muscle was divided with cautery.  The internal jugular vein was exposed along its anterior medial border.  The common facial vein was exposed and then divided between 2-0 silk ties and metal clips.  The common carotid artery was then circumferentially exposed and encircled with an umbilical tape.  The vagus nerve was identified and protected.  Next sharp dissection was used to expose the external carotid artery and the superior thyroid artery.  The were encircled with a blue vessel loop and a 2-0 silk tie respectively.  Finally, the internal carotid was carefully dissected free.  An umbilical tape was placed around the internal carotid artery distal to the diseased segment.  The hypoglossal nerve was visualized throughout and protected.  The patient was given systemic heparinization.  A bovine carotid patch was selected and prepared on the back table.  A 10 french shunt  was also prepared.  After blood pressure readings were appropriate and the heparin had been given time to circulate, the internal carotid artery was occluded with a baby Gregory clamp.  The external and common carotid arteries were then occluded with vascular clamps and the 2-0 tie tightened on the superior thyroid artery.  A #11 blade was used to make an arteriotomy in the common carotid artery.  This was extended with Potts scissors along the anterior and lateral border of the common and internal carotid artery.  Approximately 80% stenosis was identified.  There was no thrombus identified.  The 10 french shunt was not placed due to excellent backbleeding.  A kleiner kuntz elevator was used to perform endarterectomy.  An eversion endarterectomy was performed in the external carotid artery.  A good distal endpoint was obtained in the internal carotid artery.  The specimen was removed and sent to pathology.  Heparinized saline was used to irrigate the endarterectomized field.  All potential embolic debris was removed.  Bovine pericardial patch angioplasty was then performed using a running 6-0 Prolene.  The common internal and external carotid arteries were all appropriately flushed. The artery was again irrigated with heparin saline.  The anastomosis was then secured. The clamp was first released on the external carotid artery followed by the common carotid artery approximately 30 seconds later, bloodflow was reestablish through the internal carotid artery.  Next, a hand-held Doppler was used to evaluate the signals in the common, external, and internal carotid arteries, all of which had appropriate signals. I then administered 50 mg protamine. The wound was then irrigated.  After hemostasis was achieved, the carotid sheath was reapproximated with 3-0 Vicryl. The platysma muscle was reapproximated with running 3-0 Vicryl. The skin was closed with 4-0 Vicryl. Dermabond was placed on the skin. The patient was then  successfully extubated. His neurologic exam was similar to his preprocedural exam. The patient was then taken to recovery room  in stable condition. There were no complications.     Disposition:  To PACU in stable condition.  Relevant Operative Details:  80% stenosis at the bifurcation.  No shunt utilized due to excellent back bleeding.  Bifurcation was mid neck.  Hypoglossal was not mobilized.    Theotis Burrow, M.D. [facility] [place] Office: [phone] Pager:  [phone]

## 2016-03-10 NOTE — Anesthesia Procedure Notes (Signed)
Procedure Name: Intubation Date/Time: 03/10/2016 9:01 AM Performed by: Lance Coon Pre-anesthesia Checklist: Patient identified, Emergency Drugs available, Suction available, Patient being monitored and Timeout performed Patient Re-evaluated:Patient Re-evaluated prior to inductionOxygen Delivery Method: Circle system utilized Preoxygenation: Pre-oxygenation with 100% oxygen Intubation Type: IV induction Ventilation: Mask ventilation without difficulty Laryngoscope Size: Miller and 2 Grade View: Grade I Tube type: Oral Tube size: 7.0 mm Number of attempts: 1 Airway Equipment and Method: Stylet Placement Confirmation: ETT inserted through vocal cords under direct vision,  positive ETCO2 and breath sounds checked- equal and bilateral Secured at: 21 cm Tube secured with: Tape Dental Injury: Teeth and Oropharynx as per pre-operative assessment

## 2016-03-10 NOTE — Transfer of Care (Signed)
Immediate Anesthesia Transfer of Care Note  Patient: Marie Malone  Procedure(s) Performed: Procedure(s): ENDARTERECTOMY CAROTID RIGHT (Right) PATCH ANGIOPLASTY CAROTID RIGHT USING XENOSURE BIOLOGIC PATCH (Right)  Patient Location: PACU  Anesthesia Type:General  Level of Consciousness: awake, alert  and patient cooperative  Airway & Oxygen Therapy: Patient Spontanous Breathing and Patient connected to face mask oxygen  Post-op Assessment: Report given to RN and Post -op Vital signs reviewed and stable  Post vital signs: Reviewed and stable  Last Vitals:  Vitals:   03/10/16 0649  BP: 131/67  Pulse: 79  Resp: 20  Temp: 36.8 C    Last Pain:  Vitals:   03/10/16 0649  TempSrc: Oral      Patients Stated Pain Goal: 6 (68/86/48 4720)  Complications: No apparent anesthesia complications

## 2016-03-10 NOTE — Anesthesia Postprocedure Evaluation (Signed)
Anesthesia Post Note  Patient: Marie Malone  Procedure(s) Performed: Procedure(s) (LRB): ENDARTERECTOMY CAROTID RIGHT (Right) PATCH ANGIOPLASTY CAROTID RIGHT USING XENOSURE BIOLOGIC PATCH (Right)  Patient location during evaluation: PACU Anesthesia Type: General Level of consciousness: awake and awake and alert Pain management: pain level controlled Vital Signs Assessment: post-procedure vital signs reviewed and stable Respiratory status: spontaneous breathing, nonlabored ventilation and respiratory function stable Cardiovascular status: blood pressure returned to baseline Anesthetic complications: no    Last Vitals:  Vitals:   03/10/16 1053 03/10/16 1100  BP: 116/65 104/75  Pulse: 82 66  Resp: 15 14  Temp: 36.7 C     Last Pain:  Vitals:   03/10/16 1100  TempSrc:   PainSc: 7                  [name] COKER

## 2016-03-11 ENCOUNTER — Encounter (HOSPITAL_COMMUNITY)
Admission: RE | Disposition: A | Discharge status: Home / Self Care | Payer: Self-pay | Source: Ambulatory Visit | Attending: Surgery

## 2016-03-11 ENCOUNTER — Encounter (HOSPITAL_COMMUNITY): Payer: Self-pay | Admitting: Surgery

## 2016-03-11 ENCOUNTER — Ambulatory Visit (HOSPITAL_COMMUNITY)
Admission: RE | Admit: 2016-03-11 | Payer: BC Managed Care – PPO | Source: Ambulatory Visit | Admitting: Internal Medicine

## 2016-03-11 DIAGNOSIS — Z959 Presence of cardiac and vascular implant and graft, unspecified: Secondary | ICD-10-CM

## 2016-03-11 HISTORY — PX: EP IMPLANTABLE DEVICE: SHX172B

## 2016-03-11 LAB — CBC
HEMATOCRIT: 33.2 % — AB (ref 36.0–46.0)
Hemoglobin: 10.1 g/dL — ABNORMAL LOW (ref 12.0–15.0)
MCH: 26.3 pg (ref 26.0–34.0)
MCHC: 30.4 g/dL (ref 30.0–36.0)
MCV: 86.5 fL (ref 78.0–100.0)
Platelets: 131 10*3/uL — ABNORMAL LOW (ref 150–400)
RBC: 3.84 MIL/uL — ABNORMAL LOW (ref 3.87–5.11)
RDW: 15.5 % (ref 11.5–15.5)
WBC: 5 10*3/uL (ref 4.0–10.5)

## 2016-03-11 LAB — BASIC METABOLIC PANEL
ANION GAP: 4 — AB (ref 5–15)
BUN: 10 mg/dL (ref 6–20)
CALCIUM: 8.9 mg/dL (ref 8.9–10.3)
CO2: 24 mmol/L (ref 22–32)
Chloride: 112 mmol/L — ABNORMAL HIGH (ref 101–111)
Creatinine, Ser: 0.69 mg/dL (ref 0.44–1.00)
Glucose, Bld: 84 mg/dL (ref 65–99)
Potassium: 3.5 mmol/L (ref 3.5–5.1)
SODIUM: 140 mmol/L (ref 135–145)

## 2016-03-11 SURGERY — LOOP RECORDER REMOVAL

## 2016-03-11 MED ORDER — WARFARIN SODIUM 5 MG PO TABS: ORAL_TABLET | ORAL | 3 refills | Status: DC

## 2016-03-11 MED ORDER — OXYCODONE-ACETAMINOPHEN 5-325 MG PO TABS: 1.0000 | ORAL_TABLET | ORAL | 0 refills | Status: DC | PRN

## 2016-03-11 MED ORDER — ENOXAPARIN SODIUM 80 MG/0.8ML ~~LOC~~ SOLN: 80.0000 mg | Freq: Two times a day (BID) | SUBCUTANEOUS | 1 refills | Status: DC

## 2016-03-11 MED ORDER — LIDOCAINE-EPINEPHRINE 1 %-1:100000 IJ SOLN
INTRAMUSCULAR | Status: DC | PRN
  Administered 2016-03-11: 20 mL

## 2016-03-11 SURGICAL SUPPLY — 1 items: PACK LOOP INSERTION (CUSTOM PROCEDURE TRAY) ×3 IMPLANT

## 2016-03-11 NOTE — Discharge Summary (Signed)
[facility] Discharge Summary  Marie Malone 02-20-1951 65 y.o. female  [id number]  Admission Date: 03/10/2016  Discharge Date: 03/11/2016  Physician: [name], MD  Admission Diagnosis: Right carotid artery stenosis I65.21 needs out  HPI:   This is a 65 y.o. female who was referred for management of a high grade asymptomatic right carotid stenosis.  The patient underwent u/s testing which revealed 60-79% stenosis (at the top of the range) which led to a CTA of the neck revealing a radiographic string sign.  She denies any current symptoms.  She does have a history of a stroke in the remote past in 2014 where she developed slurred speech or facial droop and right arm weakness.  She will occasionally get fatigue in her right arm. She will occasionally slur her words.  She has never had any symptoms affecting the left side of her body.  She denies amaurosis.  The patient has a history of rheumatic heart disease and underwent valvuloplasty at [facility] in 2008.  She is medically managed for hypercholesterolemia with a statin.  She is treated for hypertension.  She is a former smoker.  She does take Coumadin for atrial fibrillation  Hospital Course:  The patient was admitted to the hospital and taken to the operating room on 03/10/2016 and underwent right carotid endarterectomy.  The patient tolerated the procedure well and was transported to the PACU in stable condition.  By POD 1, the patient's neuro status was intact. Her right neck was without hematoma. She was also taken to the cath lab by [name] for removal of her left chest loop recorder. Her lovenox bridge was held. She was to resume her coumadin that evening. She was instructed to continue her lovenox and coumadin as instructed by pharmacy. She has an INR follow-up on 03/16/16. She had a brief episode of asymptomatic bradycardia rate 39, then 47. Her metoprolol was held. She was ambulating, tolerating a diet and  pain well controlled. She was discharged home on POD 1 in good condition.     Recent Labs  03/11/16 0342  NA 140  K 3.5  CL 112*  CO2 24  GLUCOSE 84  BUN 10  CALCIUM 8.9    Recent Labs  03/11/16 0342  WBC 5.0  HGB 10.1*  HCT 33.2*  PLT 131*    Recent Labs  03/10/16 0700  INR 1.25    Discharge Instructions:   The patient is discharged to home with extensive instructions on wound care and progressive ambulation.  They are instructed not to drive or perform any heavy lifting until returning to see the physician in his office.  Discharge Instructions    Call MD for:  redness, tenderness, or signs of infection (pain, swelling, bleeding, redness, odor or green/yellow discharge around incision site)    Complete by:  As directed    Call MD for:  severe or increased pain, loss or decreased feeling  in affected limb(s)    Complete by:  As directed    Call MD for:  temperature >100.5    Complete by:  As directed    Discharge wound care:    Complete by:  As directed    Shower daily. Wash right neck incision gently with soap and water and pat dry. Do not peel off the skin glue on your right neck. It will peel off on its own.   You may take off the dressing on your left chest tomorrow. There are strips on your  incision. You can wash over the strips. Do not pull off the strips. They will fall off on their own.   Driving Restrictions    Complete by:  As directed    No driving for 1 weeks   Increase activity slowly    Complete by:  As directed    Walk with assistance use walker or cane as needed   Lifting restrictions    Complete by:  As directed    No lifting for 2 weeks   Resume previous diet    Complete by:  As directed       Discharge Diagnosis:  Right carotid artery stenosis I65.21 needs out  Secondary Diagnosis: Patient Active Problem List   Diagnosis Date Noted  . Asymptomatic stenosis of right carotid artery 03/10/2016  . Carotid stenosis 04/09/2014  .  Cerebral infarction due to embolism of left middle cerebral artery ([place]) 04/09/2014  . HLD (hyperlipidemia) 04/09/2014  . Valvular vegetation 04/09/2014  . Urinary urgency 12/03/2013  . Hypokalemia 07/24/2013  . Encounter for therapeutic drug monitoring 06/04/2013  . CVA (cerebral infarction) 04/22/2013  . Atrial fibrillation ([place]) 04/19/2013  . Cardiac device in situ 04/19/2013  . Endocarditis 04/13/2013  . Hyperlipidemia 04/12/2013  . Cerebrovascular disease 04/12/2013  . S/P aortic valvotomy 2008 04/06/2013  . Moderate to severe pulmonary hypertension 04/06/2013  . Mitral stenosis 10/25/2012  . Routine general medical examination at a health care facility 09/22/2012  . HTN (hypertension) 08/30/2012  . History of rheumatic fever 08/30/2012  . Chest pain 08/30/2012   Past Medical History:  Diagnosis Date  . Atrial fibrillation ([place])   . Complication of anesthesia    difficult to wake up from anesthesia  . Heart murmur   . History of rheumatic fever    as a child  . Hyperlipidemia   . Hypertension   . Mitral stenosis   . OSA (obstructive sleep apnea)    mild per sleep study 2008  . Pneumonia    double pneumonia once  . Stroke [facility])       Medication List    TAKE these medications   amLODipine 10 MG tablet Commonly known as:  NORVASC TAKE ONE TABLET BY MOUTH ONCE DAILY   aspirin EC 81 MG tablet Take 81 mg by mouth daily. will start taking 03-03-16 prior to procedure   atorvastatin 80 MG tablet Commonly known as:  LIPITOR TAKE ONE TABLET BY MOUTH ONCE DAILY AT 6 PM   BENADRYL 25 mg capsule Generic drug:  diphenhydrAMINE Take 25 mg by mouth as needed for allergies.   enoxaparin 80 MG/0.8ML injection Commonly known as:  LOVENOX Inject 0.8 mLs (80 mg total) into the skin every 12 (twelve) hours. Will start injections on 03-12-16 Start taking on:  03/12/2016 What changed:  additional instructions   metoprolol succinate 25 MG 24 hr tablet Commonly known as:   TOPROL-XL TAKE ONE TABLET BY MOUTH ONCE DAILY   oxyCODONE-acetaminophen 5-325 MG tablet Commonly known as:  PERCOCET/ROXICET Take 1-2 tablets by mouth every 4 (four) hours as needed for moderate pain.   potassium chloride 10 MEQ tablet Commonly known as:  K-DUR Take 1 tablet (10 mEq total) by mouth daily.   TYLENOL 325 MG tablet Generic drug:  acetaminophen Take 650 mg by mouth as needed for mild pain.   warfarin 5 MG tablet Commonly known as:  COUMADIN Take 7.5 mg (1.5 tabs) 03/11/16, 7.5 mg (1.5 tabs) 03/12/16, 7.5 mg (1.5 tabs) 03/13/16, 5 mg (1 tab) on 03/14/16,  5 mg (1 tab) on 03/15/16, INR check on 03/16/16 What changed:  additional instructions       Percocet #10 No Refill  Disposition: home  Patient's condition: is Good  Follow up: 1. Dr.  Trula Slade in 2 weeks.   Virgina Jock, PA-C [facility] [phone]  --- For [facility] use --- Instructions: Press F2 to tab through selections.  Delete question if not applicable.   Modified Rankin score at D/C (0-6): 0  IV medication needed for:  1. Hypertension: No 2. Hypotension: No  Post-op Complications: No  1. Post-op CVA or TIA: No  2. CN injury: No  3. Myocardial infarction: No  4.  CHF: No  5.  Dysrhythmia (new): No  6. Wound infection: No  7. Reperfusion symptoms: No  8. Return to OR: No  Discharge medications: Statin use:  Yes If No: [ ]  For Medical reasons, [ ]  Non-compliant, [ ]  Not-indicated ASA use:  Yes  If No: [ ]  For Medical reasons, [ ]  Non-compliant, [ ]  Not-indicated Beta blocker use:  Yes If No: [ ]  For Medical reasons, [ ]  Non-compliant, [ ]  Not-indicated ACE-Inhibitor use:  No If No: [ ]  For Medical reasons, [ ]  Non-compliant, [x ] Not-indicated P2Y12 Antagonist use: No, [ ]  Plavix, [ ]  Plasugrel, [ ]  Ticlopinine, [ ]  Ticagrelor, [ ]  Other, [ ]  No for medical reason, [ ]  Non-compliant, [x ] Not-indicated Anti-coagulant use:  Yes, [ x] Warfarin, [ ]  Rivaroxaban, [ ]   Dabigatran, [ ]  Other, [ ]  No for medical reason, [ ]  Non-compliant, [ ]  Not-indicated

## 2016-03-11 NOTE — Progress Notes (Signed)
    Subjective  - POD #1  No complaints   Physical Exam:  Incision ok, no hematoma Neuro intact Tongue midline Equal strength       Assessment/Plan:  POD #1  Loop recorder removed this am Will plan for d/c if tolerates breakfast, ambulates  Carriann Hesse, [place] 03/11/2016 7:45 AM --  Vitals:   03/11/16 0333 03/11/16 0742  BP: (!) 107/53 (P) 126/66  Pulse: (!) 58 (P) 80  Resp: 17   Temp: 98.2 F (36.8 C) (P) 98.1 F (36.7 C)    Intake/Output Summary (Last 24 hours) at 03/11/16 0745 Last data filed at 03/11/16 0050  Gross per 24 hour  Intake          1414.17 ml  Output              370 ml  Net          1044.17 ml     Laboratory CBC    Component Value Date/Time   WBC 5.0 03/11/2016 0342   HGB 10.1 (L) 03/11/2016 0342   HGB 12.2 02/13/2014 1331   HCT 33.2 (L) 03/11/2016 0342   HCT 37.9 02/13/2014 1331   PLT 131 (L) 03/11/2016 0342   PLT 157 02/13/2014 1331    BMET    Component Value Date/Time   NA 140 03/11/2016 0342   K 3.5 03/11/2016 0342   CL 112 (H) 03/11/2016 0342   CO2 24 03/11/2016 0342   GLUCOSE 84 03/11/2016 0342   BUN 10 03/11/2016 0342   CREATININE 0.69 03/11/2016 0342   CREATININE 0.98 01/30/2016 1410   CALCIUM 8.9 03/11/2016 0342   GFRNONAA >60 03/11/2016 0342   GFRNONAA 64 12/17/2013 1607   GFRAA >60 03/11/2016 0342   GFRAA 74 12/17/2013 1607    COAG Lab Results  Component Value Date   INR 1.25 03/10/2016   INR 2.64 03/04/2016   INR 3.0 03/02/2016   No results found for: PTT  Antibiotics Anti-infectives    Start     Dose/Rate Route Frequency Ordered Stop   03/10/16 2100  cefUROXime (ZINACEF) 1.5 g in dextrose 5 % 50 mL IVPB     1.5 g 100 mL/hr over 30 Minutes Intravenous Every 12 hours 03/10/16 1640 03/11/16 2059   03/10/16 0715  ceFAZolin (ANCEF) IVPB 2g/100 mL premix  Status:  Discontinued     2 g 200 mL/hr over 30 Minutes Intravenous On call 03/10/16 0706 03/10/16 1639   03/10/16 0715  gentamicin (GARAMYCIN) 80 mg  in sodium chloride irrigation 0.9 % 500 mL irrigation  Status:  Discontinued     80 mg Irrigation On call 03/10/16 0706 03/10/16 1639   03/10/16 0708  ceFAZolin (ANCEF) 2-4 GM/100ML-% IVPB    Comments:  Precious Haws   : cabinet override      03/10/16 0708 03/10/16 1914   03/10/16 0650  cefUROXime (ZINACEF) 1.5 g in dextrose 5 % 50 mL IVPB     1.5 g 100 mL/hr over 30 Minutes Intravenous 30 min pre-op 03/10/16 0650 03/10/16 0910   03/10/16 0631  dextrose 5 % with cefUROXime (ZINACEF) ADS Med    Comments:  Maryjean Ka   : cabinet override      03/10/16 0631 03/10/16 1844       V. Leia Alf, M.D. [facility] [place] Office: [phone] Pager:  [phone]

## 2016-03-11 NOTE — Care Management Note (Signed)
Case Management Note  Patient Details  Name: Marie Malone MRN: [id number] Date of Birth: 08-Jan-1951  Subjective/Objective:  S/p CEA, loop recorder removed, patient dc to home , no needs.                  Action/Plan:   Expected Discharge Date:                  Expected Discharge Plan:  Home/Self Care  In-House Referral:     Discharge planning Services  CM Consult  Post Acute Care Choice:    Choice offered to:     DME Arranged:    DME Agency:     HH Arranged:    HH Agency:     Status of Service:  Completed, signed off  If discussed at [employer] of Stay Meetings, dates discussed:    Additional Comments:  Zenon Mayo, RN 03/11/2016, 12:50 PM

## 2016-03-11 NOTE — Progress Notes (Signed)
Discharge education done with the patient as well as with the patient's husband and daughter. Went over medications, follow up appointments, when to call the Md, and signs and symptoms of infection. Patient discharged in wheelchair by nurse tech. Basil Dess, RN

## 2016-03-11 NOTE — H&P (View-Only) (Signed)
Patient Care Team: Debbrah Alar, NP as PCP - General (Internal Medicine) Deneise Lever, MD as Consulting Physician (Pulmonary Disease) Collene Gobble, MD as Consulting Physician (Pulmonary Disease) Deboraha Sprang, MD as Consulting Physician (Cardiology) [name], MD as Consulting Physician (Cardiology) Lorretta Harp, MD as Consulting Physician (Cardiology)   HPI  Marie Malone is a 65 y.o. female Seen at the request of Dr. London Sheer to discuss removal of a link implanted 2014 for cryptogenic stroke is also having demonstrated atrial fibrillation     Past Medical History:  Diagnosis Date  . Atrial fibrillation ([place])   . Complication of anesthesia    difficult to wake up from anesthesia  . Heart murmur   . History of rheumatic fever    as a child  . Hyperlipidemia   . Hypertension   . Mitral stenosis   . OSA (obstructive sleep apnea)    mild per sleep study 2008  . Pneumonia    double pneumonia once  . Stroke [facility])     Past Surgical History:  Procedure Laterality Date  . ABDOMINAL HYSTERECTOMY    . APPENDECTOMY  1989   appendix ruptured,had peritonitis  . CARDIAC SURGERY  2008   "balloon surgery at [facility]"  . COLONOSCOPY W/ POLYPECTOMY    . 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 LOOP RECORDER IMPLANT N/A 04/10/2013   Procedure: LOOP RECORDER IMPLANT;  Surgeon: Coralyn Mark, MD;  Location: [place] CATH LAB;  Service: Cardiovascular;  Laterality: N/A;  . TEE WITHOUT CARDIOVERSION N/A 04/10/2013   Procedure: TRANSESOPHAGEAL ECHOCARDIOGRAM (TEE);  Surgeon: [name], MD;  Location: [facility] ENDOSCOPY;  Service: Cardiovascular;  Laterality: N/A;    Current Outpatient Prescriptions  Medication Sig Dispense Refill  . acetaminophen (TYLENOL) 325 MG tablet Take 650 mg by mouth as needed.    [name] amLODipine (NORVASC) 10 MG tablet TAKE ONE TABLET BY MOUTH ONCE DAILY 30 tablet 5  . atorvastatin (LIPITOR) 80  MG tablet TAKE ONE TABLET BY MOUTH ONCE DAILY AT 6 PM 30 tablet 5  . diphenhydrAMINE (BENADRYL) 25 mg capsule Take 25 mg by mouth as needed.    . metoprolol succinate (TOPROL-XL) 25 MG 24 hr tablet TAKE ONE TABLET BY MOUTH ONCE DAILY 30 tablet 5  . potassium chloride (K-DUR) 10 MEQ tablet Take 1 tablet (10 mEq total) by mouth daily. 30 tablet 2  . warfarin (COUMADIN) 5 MG tablet Take 1 to 1.5 tablets by mouth daily as directed by coumadin clinic 45 tablet 3   No current facility-administered medications for this visit.     No Known Allergies    Review of Systems negative except from HPI and PMH  Physical Exam BP 128/64   Pulse 88   Ht 5\' 2"  (1.575 m)   Wt 171 lb 4 oz (77.7 kg)   SpO2 98%   BMI 31.32 kg/m  Well developed and well nourished in no acute distress HENT normal E scleral and icterus clear Neck Supple JVP flat; carotids brisk and full Clear to ausculation Device pocket well healed; without hematoma or erythema.  There is no tethering  Regular rate and rhythm, no murmurs gallops or rub Soft with active bowel sounds No clubbing cyanosis  Edema Alert and oriented, grossly normal motor and sensory function Skin Warm and Dry  ECG demonstrates sinus rhythm at 76 Intervals 23/07/38   Assessment and  Plan  Implantable loop recorder  Cryptogenic stroke  Atrial fibrillation   The patient is currently managed with warfarin. It is the desire for primary care physician to get the loop recorder out. She is scheduled to have carotid endarterectomy on 03/10/16. I have discussed with [name] as to undertake loop recorder explantation at the time of her endarterectomy. He is agreeable. We will arrange wound check appointment with him as well       Current medicines are reviewed at length with the patient today .  The patient does not  have concerns regarding medicines.

## 2016-03-11 NOTE — Progress Notes (Signed)
Patient's heart rate dropped down to 39 sinus bradycardia then 47 sinus brady asymptomatic. Heart rate returned to the 80s normal sinus. Blood pressure 136/60. Spoke with Virgina Jock, Pa. Told to hold metoprolol and restart tomorrow. Stated to give amlodipine. Patient discharging home today. Will educate on restarting metoprolol tomorrow.  Basil Dess, RN

## 2016-03-11 NOTE — Interval H&P Note (Signed)
History and Physical Interval Note:  03/11/2016 7:04 AM  Marie Malone  has presented today for surgery, with the diagnosis of needs out  The various methods of treatment have been discussed with the patient and family. After consideration of risks, benefits and other options for treatment, the patient has consented to  Procedure(s): Loop Recorder Removal (N/A) as a surgical intervention .  The patient's history has been reviewed, patient examined, no change in status, stable for surgery.  I have reviewed the patient's chart and labs.  Questions were answered to the patient's satisfaction.     Virl Axe

## 2016-03-12 ENCOUNTER — Encounter (HOSPITAL_COMMUNITY): Payer: Self-pay | Admitting: Internal Medicine

## 2016-03-12 ENCOUNTER — Telehealth: Payer: Self-pay | Admitting: Surgery

## 2016-03-12 LAB — CUP PACEART REMOTE DEVICE CHECK
Date Time Interrogation Session: 20171025060748
Implantable Pulse Generator Implant Date: 20141202

## 2016-03-12 NOTE — Telephone Encounter (Signed)
Spoke to pt about appt on home # for 11/16

## 2016-03-12 NOTE — Telephone Encounter (Signed)
-----   Message from Mena Goes, RN sent at 03/11/2016  1:17 PM EDT ----- Regarding: schedule   ----- Message ----- From: Alvia Grove, PA-C Sent: 03/11/2016   9:11 AM To: [facility] Charge Pool  S/p right carotid endarterectomy 03/11/16  F/u with [name] in 2 weeks  Thanks Maudie Mercury

## 2016-03-15 ENCOUNTER — Telehealth: Payer: Self-pay | Admitting: Family

## 2016-03-15 DIAGNOSIS — Z0000 Encounter for general adult medical examination without abnormal findings: Secondary | ICD-10-CM

## 2016-03-15 MED ORDER — POTASSIUM CHLORIDE ER 10 MEQ PO TBCR: 10.0000 meq | EXTENDED_RELEASE_TABLET | Freq: Every day | ORAL | 5 refills | Status: DC

## 2016-03-15 MED ORDER — ATORVASTATIN CALCIUM 80 MG PO TABS: ORAL_TABLET | ORAL | 5 refills | Status: DC

## 2016-03-15 MED ORDER — METOPROLOL SUCCINATE ER 25 MG PO TB24: 25.0000 mg | ORAL_TABLET | Freq: Every day | ORAL | 5 refills | Status: DC

## 2016-03-15 NOTE — Telephone Encounter (Signed)
Sent refills on below meds except amlodipine 10mg . Last Rx went to [facility] [address] on 02/04/16, #30 x 5 refills. Advised pt to contact pharmacy to use refills on hold and pt voices understanding.

## 2016-03-15 NOTE — Telephone Encounter (Signed)
Patient is requesting refills of metoprolol succinate (TOPROL-XL) 25 MG 24 hr tablet amLODipine (NORVASC) 10 MG tablet atorvastatin (LIPITOR) 80 MG tablet potassium chloride (K-DUR) 10 MEQ tablet  Pharmacy:  [place], [place] - [address]

## 2016-03-16 ENCOUNTER — Ambulatory Visit (INDEPENDENT_AMBULATORY_CARE_PROVIDER_SITE_OTHER): Payer: BC Managed Care – PPO | Admitting: Pharmacist

## 2016-03-16 DIAGNOSIS — I4891 Unspecified atrial fibrillation: Secondary | ICD-10-CM

## 2016-03-16 DIAGNOSIS — Z5181 Encounter for therapeutic drug level monitoring: Secondary | ICD-10-CM

## 2016-03-16 LAB — POCT INR: INR: 3.3

## 2016-03-18 LAB — CUP PACEART REMOTE DEVICE CHECK
Date Time Interrogation Session: 20170925053636
Implantable Pulse Generator Implant Date: 20141202

## 2016-03-18 NOTE — Progress Notes (Signed)
Carelink summary report received. Battery status OK. Normal device function. No new symptom episodes, tachy episodes, brady, or pause episodes.764 AF 11.0% available ECGs appear: some SR w/ PACs & PVCs, others appear AF. See ECGs. +warfarin +metoprolol.  Monthly summary reports and ROV/PRN

## 2016-03-19 ENCOUNTER — Observation Stay (HOSPITAL_BASED_OUTPATIENT_CLINIC_OR_DEPARTMENT_OTHER)
Admission: EM | Admit: 2016-03-19 | Discharge: 2016-03-20 | Disposition: A | Discharge status: Home / Self Care | Payer: BC Managed Care – PPO | Source: Home / Self Care | Attending: Family Medicine | Admitting: Family Medicine

## 2016-03-19 ENCOUNTER — Encounter: Payer: Self-pay | Admitting: Surgery

## 2016-03-19 ENCOUNTER — Emergency Department (HOSPITAL_COMMUNITY): Payer: BC Managed Care – PPO

## 2016-03-19 ENCOUNTER — Encounter (HOSPITAL_COMMUNITY): Payer: Self-pay | Admitting: Emergency Medicine

## 2016-03-19 ENCOUNTER — Observation Stay (HOSPITAL_COMMUNITY): Payer: BC Managed Care – PPO

## 2016-03-19 DIAGNOSIS — I2582 Chronic total occlusion of coronary artery: Secondary | ICD-10-CM | POA: Diagnosis present

## 2016-03-19 DIAGNOSIS — I5032 Chronic diastolic (congestive) heart failure: Secondary | ICD-10-CM | POA: Diagnosis present

## 2016-03-19 DIAGNOSIS — E860 Dehydration: Secondary | ICD-10-CM

## 2016-03-19 DIAGNOSIS — R404 Transient alteration of awareness: Secondary | ICD-10-CM | POA: Diagnosis present

## 2016-03-19 DIAGNOSIS — Z7901 Long term (current) use of anticoagulants: Secondary | ICD-10-CM

## 2016-03-19 DIAGNOSIS — R4182 Altered mental status, unspecified: Secondary | ICD-10-CM

## 2016-03-19 DIAGNOSIS — L7632 Postprocedural hematoma of skin and subcutaneous tissue following other procedure: Secondary | ICD-10-CM

## 2016-03-19 DIAGNOSIS — E785 Hyperlipidemia, unspecified: Secondary | ICD-10-CM | POA: Diagnosis present

## 2016-03-19 DIAGNOSIS — I441 Atrioventricular block, second degree: Secondary | ICD-10-CM | POA: Diagnosis present

## 2016-03-19 DIAGNOSIS — M26213 Malocclusion, Angle's class III: Secondary | ICD-10-CM | POA: Diagnosis present

## 2016-03-19 DIAGNOSIS — R51 Headache: Secondary | ICD-10-CM | POA: Insufficient documentation

## 2016-03-19 DIAGNOSIS — I050 Rheumatic mitral stenosis: Secondary | ICD-10-CM | POA: Diagnosis present

## 2016-03-19 DIAGNOSIS — I440 Atrioventricular block, first degree: Secondary | ICD-10-CM | POA: Insufficient documentation

## 2016-03-19 DIAGNOSIS — I6521 Occlusion and stenosis of right carotid artery: Secondary | ICD-10-CM | POA: Diagnosis present

## 2016-03-19 DIAGNOSIS — Z7982 Long term (current) use of aspirin: Secondary | ICD-10-CM

## 2016-03-19 DIAGNOSIS — R791 Abnormal coagulation profile: Secondary | ICD-10-CM

## 2016-03-19 DIAGNOSIS — I951 Orthostatic hypotension: Secondary | ICD-10-CM | POA: Diagnosis not present

## 2016-03-19 DIAGNOSIS — E876 Hypokalemia: Secondary | ICD-10-CM | POA: Insufficient documentation

## 2016-03-19 DIAGNOSIS — I272 Pulmonary hypertension, unspecified: Secondary | ICD-10-CM | POA: Diagnosis present

## 2016-03-19 DIAGNOSIS — Z8249 Family history of ischemic heart disease and other diseases of the circulatory system: Secondary | ICD-10-CM

## 2016-03-19 DIAGNOSIS — K06020 Generalized gingival recession, unspecified: Secondary | ICD-10-CM | POA: Diagnosis present

## 2016-03-19 DIAGNOSIS — I110 Hypertensive heart disease with heart failure: Secondary | ICD-10-CM | POA: Diagnosis present

## 2016-03-19 DIAGNOSIS — Z79899 Other long term (current) drug therapy: Secondary | ICD-10-CM

## 2016-03-19 DIAGNOSIS — Z833 Family history of diabetes mellitus: Secondary | ICD-10-CM | POA: Diagnosis not present

## 2016-03-19 DIAGNOSIS — I480 Paroxysmal atrial fibrillation: Secondary | ICD-10-CM | POA: Diagnosis present

## 2016-03-19 DIAGNOSIS — Z8673 Personal history of transient ischemic attack (TIA), and cerebral infarction without residual deficits: Secondary | ICD-10-CM

## 2016-03-19 DIAGNOSIS — K5900 Constipation, unspecified: Secondary | ICD-10-CM | POA: Diagnosis present

## 2016-03-19 DIAGNOSIS — K0889 Other specified disorders of teeth and supporting structures: Secondary | ICD-10-CM | POA: Diagnosis present

## 2016-03-19 DIAGNOSIS — Z87891 Personal history of nicotine dependence: Secondary | ICD-10-CM | POA: Insufficient documentation

## 2016-03-19 DIAGNOSIS — I4581 Long QT syndrome: Secondary | ICD-10-CM

## 2016-03-19 DIAGNOSIS — G4733 Obstructive sleep apnea (adult) (pediatric): Secondary | ICD-10-CM | POA: Diagnosis present

## 2016-03-19 DIAGNOSIS — I10 Essential (primary) hypertension: Secondary | ICD-10-CM | POA: Diagnosis present

## 2016-03-19 DIAGNOSIS — I2510 Atherosclerotic heart disease of native coronary artery without angina pectoris: Secondary | ICD-10-CM | POA: Diagnosis present

## 2016-03-19 DIAGNOSIS — Z823 Family history of stroke: Secondary | ICD-10-CM

## 2016-03-19 DIAGNOSIS — R55 Syncope and collapse: Secondary | ICD-10-CM | POA: Diagnosis present

## 2016-03-19 DIAGNOSIS — K045 Chronic apical periodontitis: Secondary | ICD-10-CM | POA: Diagnosis present

## 2016-03-19 DIAGNOSIS — K148 Other diseases of tongue: Secondary | ICD-10-CM | POA: Diagnosis present

## 2016-03-19 DIAGNOSIS — Z952 Presence of prosthetic heart valve: Secondary | ICD-10-CM

## 2016-03-19 DIAGNOSIS — K029 Dental caries, unspecified: Secondary | ICD-10-CM | POA: Diagnosis present

## 2016-03-19 DIAGNOSIS — R9431 Abnormal electrocardiogram [ECG] [EKG]: Secondary | ICD-10-CM | POA: Diagnosis present

## 2016-03-19 LAB — COMPREHENSIVE METABOLIC PANEL
ALT: 24 U/L (ref 14–54)
AST: 28 U/L (ref 15–41)
Albumin: 3.7 g/dL (ref 3.5–5.0)
Alkaline Phosphatase: 75 U/L (ref 38–126)
Anion gap: 13 (ref 5–15)
BUN: 12 mg/dL (ref 6–20)
CHLORIDE: 103 mmol/L (ref 101–111)
CO2: 22 mmol/L (ref 22–32)
Calcium: 9.7 mg/dL (ref 8.9–10.3)
Creatinine, Ser: 0.95 mg/dL (ref 0.44–1.00)
Glucose, Bld: 143 mg/dL — ABNORMAL HIGH (ref 65–99)
POTASSIUM: 3.1 mmol/L — AB (ref 3.5–5.1)
SODIUM: 138 mmol/L (ref 135–145)
Total Bilirubin: 1.2 mg/dL (ref 0.3–1.2)
Total Protein: 8.5 g/dL — ABNORMAL HIGH (ref 6.5–8.1)

## 2016-03-19 LAB — URINALYSIS, ROUTINE W REFLEX MICROSCOPIC
Bilirubin Urine: NEGATIVE
GLUCOSE, UA: NEGATIVE mg/dL
HGB URINE DIPSTICK: NEGATIVE
Ketones, ur: NEGATIVE mg/dL
Leukocytes, UA: NEGATIVE
Nitrite: NEGATIVE
PH: 7.5 (ref 5.0–8.0)
Protein, ur: NEGATIVE mg/dL
SPECIFIC GRAVITY, URINE: 1.011 (ref 1.005–1.030)

## 2016-03-19 LAB — APTT: aPTT: 40 seconds — ABNORMAL HIGH (ref 24–36)

## 2016-03-19 LAB — RAPID URINE DRUG SCREEN, HOSP PERFORMED
Amphetamines: NOT DETECTED
BENZODIAZEPINES: NOT DETECTED
Barbiturates: NOT DETECTED
COCAINE: NOT DETECTED
OPIATES: NOT DETECTED
Tetrahydrocannabinol: NOT DETECTED

## 2016-03-19 LAB — DIFFERENTIAL
BASOS ABS: 0 10*3/uL (ref 0.0–0.1)
BASOS PCT: 0 %
EOS ABS: 0 10*3/uL (ref 0.0–0.7)
Eosinophils Relative: 1 %
Lymphocytes Relative: 29 %
Lymphs Abs: 1.6 10*3/uL (ref 0.7–4.0)
MONO ABS: 0.6 10*3/uL (ref 0.1–1.0)
MONOS PCT: 10 %
Neutro Abs: 3.4 10*3/uL (ref 1.7–7.7)
Neutrophils Relative %: 60 %

## 2016-03-19 LAB — CBC
HCT: 37.6 % (ref 36.0–46.0)
HEMOGLOBIN: 11.9 g/dL — AB (ref 12.0–15.0)
MCH: 26.7 pg (ref 26.0–34.0)
MCHC: 31.6 g/dL (ref 30.0–36.0)
MCV: 84.3 fL (ref 78.0–100.0)
PLATELETS: 183 10*3/uL (ref 150–400)
RBC: 4.46 MIL/uL (ref 3.87–5.11)
RDW: 15.4 % (ref 11.5–15.5)
WBC: 5.6 10*3/uL (ref 4.0–10.5)

## 2016-03-19 LAB — MAGNESIUM: MAGNESIUM: 2 mg/dL (ref 1.7–2.4)

## 2016-03-19 LAB — PROTIME-INR
INR: 3.92
Prothrombin Time: 39.4 seconds — ABNORMAL HIGH (ref 11.4–15.2)

## 2016-03-19 LAB — I-STAT TROPONIN, ED: TROPONIN I, POC: 0 ng/mL (ref 0.00–0.08)

## 2016-03-19 LAB — ETHANOL: Alcohol, Ethyl (B): <5 mg/dL (ref <5)

## 2016-03-19 LAB — PHOSPHORUS: PHOSPHORUS: 3.7 mg/dL (ref 2.5–4.6)

## 2016-03-19 MED ORDER — SODIUM CHLORIDE 0.9 % IV SOLN
INTRAVENOUS | Status: DC
  Administered 2016-03-20: 02:00:00 via INTRAVENOUS

## 2016-03-19 MED ORDER — IOPAMIDOL (ISOVUE-370) INJECTION 76%
INTRAVENOUS | Status: AC
  Administered 2016-03-19: 50 mL
  Filled 2016-03-19: qty 50

## 2016-03-19 MED ORDER — POLYETHYLENE GLYCOL 3350 17 G PO PACK
17.0000 g | PACK | Freq: Every day | ORAL | Status: DC
  Administered 2016-03-19: 17 g via ORAL
  Filled 2016-03-19 (×2): qty 1

## 2016-03-19 MED ORDER — ACETAMINOPHEN 325 MG PO TABS
650.0000 mg | ORAL_TABLET | Freq: Four times a day (QID) | ORAL | Status: DC | PRN
Start: 2016-03-19 — End: 2016-03-20
  Administered 2016-03-20: 650 mg via ORAL
  Filled 2016-03-19: qty 2

## 2016-03-19 MED ORDER — SODIUM CHLORIDE 0.9% FLUSH
3.0000 mL | Freq: Two times a day (BID) | INTRAVENOUS | Status: DC
  Administered 2016-03-19 – 2016-03-20 (×2): 3 mL via INTRAVENOUS

## 2016-03-19 MED ORDER — SODIUM CHLORIDE 0.9 % IV SOLN
Freq: Once | INTRAVENOUS | Status: AC
  Administered 2016-03-19: 07:00:00 via INTRAVENOUS
  Filled 2016-03-19: qty 1000

## 2016-03-19 MED ORDER — DOCUSATE SODIUM 100 MG PO CAPS
100.0000 mg | ORAL_CAPSULE | Freq: Two times a day (BID) | ORAL | Status: DC
Start: 2016-03-19 — End: 2016-03-20
  Administered 2016-03-19 – 2016-03-20 (×2): 100 mg via ORAL
  Filled 2016-03-19 (×3): qty 1

## 2016-03-19 MED ORDER — WARFARIN - PHARMACIST DOSING INPATIENT: Freq: Every day | Status: DC

## 2016-03-19 MED ORDER — ENSURE ENLIVE PO LIQD
237.0000 mL | Freq: Two times a day (BID) | ORAL | Status: DC
  Administered 2016-03-20: 237 mL via ORAL

## 2016-03-19 MED ORDER — KETOROLAC TROMETHAMINE 15 MG/ML IJ SOLN
15.0000 mg | Freq: Once | INTRAMUSCULAR | Status: AC
  Administered 2016-03-19: 15 mg via INTRAVENOUS
  Filled 2016-03-19: qty 1

## 2016-03-19 MED ORDER — AMLODIPINE BESYLATE 10 MG PO TABS
10.0000 mg | ORAL_TABLET | Freq: Every day | ORAL | Status: DC
  Administered 2016-03-20: 10 mg via ORAL
  Filled 2016-03-19: qty 1

## 2016-03-19 MED ORDER — MAGNESIUM HYDROXIDE 400 MG/5ML PO SUSP
30.0000 mL | Freq: Once | ORAL | Status: AC
  Administered 2016-03-19: 30 mL via ORAL
  Filled 2016-03-19: qty 30

## 2016-03-19 MED ORDER — POTASSIUM CHLORIDE CRYS ER 20 MEQ PO TBCR
40.0000 meq | EXTENDED_RELEASE_TABLET | Freq: Once | ORAL | Status: AC
  Administered 2016-03-19: 40 meq via ORAL
  Filled 2016-03-19: qty 2

## 2016-03-19 MED ORDER — ATORVASTATIN CALCIUM 80 MG PO TABS
80.0000 mg | ORAL_TABLET | Freq: Every day | ORAL | Status: DC
  Administered 2016-03-19: 80 mg via ORAL
  Filled 2016-03-19: qty 1

## 2016-03-19 MED ORDER — SODIUM CHLORIDE 0.9 % IV BOLUS (SEPSIS)
500.0000 mL | Freq: Once | INTRAVENOUS | Status: AC
  Administered 2016-03-19: 500 mL via INTRAVENOUS

## 2016-03-19 MED ORDER — KETOROLAC TROMETHAMINE 15 MG/ML IJ SOLN
15.0000 mg | Freq: Four times a day (QID) | INTRAMUSCULAR | Status: DC | PRN
  Administered 2016-03-19 – 2016-03-20 (×3): 15 mg via INTRAVENOUS
  Filled 2016-03-19 (×3): qty 1

## 2016-03-19 MED ORDER — POTASSIUM CHLORIDE ER 10 MEQ PO TBCR
10.0000 meq | EXTENDED_RELEASE_TABLET | Freq: Every day | ORAL | Status: DC
  Administered 2016-03-19: 10 meq via ORAL
  Filled 2016-03-19 (×3): qty 1

## 2016-03-19 MED ORDER — ACETAMINOPHEN 650 MG RE SUPP: 650.0000 mg | Freq: Four times a day (QID) | RECTAL | Status: DC | PRN

## 2016-03-19 MED ORDER — DEXAMETHASONE SODIUM PHOSPHATE 4 MG/ML IJ SOLN
4.0000 mg | Freq: Once | INTRAMUSCULAR | Status: AC
  Administered 2016-03-19: 4 mg via INTRAVENOUS
  Filled 2016-03-19: qty 1

## 2016-03-19 NOTE — ED Notes (Signed)
Returned from xray

## 2016-03-19 NOTE — ED Triage Notes (Signed)
Pt brought to ED by [facility] from home for mental status change, found lethargic, no verbal around 2 am, last seen well at 2300 last night before going to bed, pt took some tylenol pm at bedtime. Unable to get BP or radial pulses on EMS arrival, very lethargic, clammy skin, IV started and fluids given by EMS, pt with a HR of 108 and  BP 130/90 after fluids given, CBG 142. Pt post procedure for blood clot removed on her right neck.

## 2016-03-19 NOTE — ED Notes (Signed)
Patient is resting comfortably. 

## 2016-03-19 NOTE — ED Notes (Signed)
Patient transported to X-ray 

## 2016-03-19 NOTE — H&P (Signed)
History and Physical    Marie Malone OJJ:[id number] DOB: Mar 18, 1951 DOA: 03/19/2016   PCP: [name], NP   Patient coming from/Resides with: Private residence/lives with spouse  Admission status: Observation/telemetry -will need to be reevaluated within the next 24 hours to determine if it will be medically necessary to stay a minimum 2 midnights to rule out impending and/or unexpected changes in physiologic status that may differ from initial evaluation performed in the ER and/or at time of admission. Presents with symptomatic with a static hypotension with associated tachycardia likely brought about by poor oral intake in the setting of postoperative constipation/narcotic pain meds. Patient will require gentle IV fluid hydration for the next 24 hours, assisted ambulation, orthostatic vital signs every shift and when necessary.  Chief Complaint: Altered mental status  HPI: Marie Malone is a 65 y.o. female with medical history significant for hypertension, dyslipidemia, known mitral stenosis and moderate to severe pulmonary hypertension, history of prior CVA, recent right carotid endarterectomy on 11/1, A2 fibrillation on warfarin, prior aortic valvulotomy in 2008. Patient has been having issues with persistent headache and operative site discomfort since the surgical procedure. Patient took 2 Tylenol PM's prior to going to bed around 11 PM and woke up just before 2 AM and ask for water. When her husband returned patient was sitting up in the bed but "staring off"and would not respond to him. No apparent seizure activity including no bowel or bladder incontinence. EMS was called to the home and the patient was found to be tachycardic and hypotensive. She was given IV fluids with improvement in blood pressure. She was complaining of increased pain at the operative site upon arrival to the ER. Additional questioning revealed that patient has had essentially no appetite since surgery and  has not had a bowel movement since the surgery. She has had cramping in her feet. She's not had any nausea vomiting or diarrhea. She's not started any new medications or had changes in the dosages of her usual medications. She was taking Percocet and immediate postoperative period.  ED Course:  Vital Signs: BP 142/71   Pulse 78   Temp 97.7 F (36.5 C) (Oral)   Resp 13   Ht 5\' 2"  (1.575 m)   Wt 80.3 kg (177 lb)   SpO2 99%   BMI 32.37 kg/m  CT angiography of the head and neck with and without contrast: No acute intracranial abnormalities, history of remote left MCA and left cerebellar infarcts, status post right carotid endarterectomy with greatly improved patency Lab data: Sodium 138, potassium 3.1, CO2 22, BUN 12, creatinine 0.95, glucose 143, LFTs normal,poc troponin 0.00, white count 5600 with normal differential, hemoglobin 11.9, platelets 183,000, PT 39.4 INR 3.92, PTT 40, urinalysis unremarkable, alcohol level less than 5, urine drug screen negative Medications and treatments: Normal saline bolus 500 mL 2, continuous IV infusion of saline with 80 mEq of potassium  Review of Systems:  In addition to the HPI above,  No Fever-chills, myalgias or other constitutional symptoms No changes with Vision or hearing, new weakness, tingling, numbness in any extremity, dizziness, dysarthria or word finding difficulty, gait disturbance or imbalance, tremors or seizure activity No problems swallowing food or Liquids, indigestion/reflux, choking or coughing while eating, abdominal pain with or after eating No Chest pain, Cough or Shortness of Breath, palpitations, orthopnea or DOE No Abdominal pain, N/V, melena,hematochezia, dark tarry stools No dysuria, malodorous urine, hematuria or flank pain No new skin rashes, lesions, masses or bruises, No new  joint pains, aches, swelling or redness No recent unintentional weight gain or loss No polyuria, polydypsia or polyphagia   Past Medical History:    Diagnosis Date  . Atrial fibrillation ([place])   . Complication of anesthesia    difficult to wake up from anesthesia  . Heart murmur   . History of rheumatic fever    as a child  . Hyperlipidemia   . Hypertension   . Mitral stenosis   . OSA (obstructive sleep apnea)    mild per sleep study 2008  . Pneumonia    double pneumonia once  . Stroke [facility])     Past Surgical History:  Procedure Laterality Date  . APPENDECTOMY  1989   appendix ruptured,had peritonitis  . CARDIAC SURGERY  2008   "balloon surgery at [facility]"  . COLONOSCOPY W/ POLYPECTOMY    . ENDARTERECTOMY Right 03/10/2016   Procedure: ENDARTERECTOMY CAROTID RIGHT;  Surgeon: Serafina Mitchell, MD;  Location: [facility];  Service: Vascular;  Laterality: Right;  . EP IMPLANTABLE DEVICE N/A 03/11/2016   Procedure: Loop Recorder Removal;  Surgeon: Deboraha Sprang, MD;  Location: [place] [facility] CV LAB;  Service: Cardiovascular;  Laterality: N/A;  . 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 LOOP RECORDER IMPLANT N/A 04/10/2013   Procedure: LOOP RECORDER IMPLANT;  Surgeon: Coralyn Mark, MD;  Location: [facility] CATH LAB;  Service: Cardiovascular;  Laterality: N/A;  . PATCH ANGIOPLASTY Right 03/10/2016   Procedure: PATCH ANGIOPLASTY CAROTID RIGHT USING Rueben Bash BIOLOGIC PATCH;  Surgeon: Serafina Mitchell, MD;  Location: [place];  Service: Vascular;  Laterality: Right;  . TEE WITHOUT CARDIOVERSION N/A 04/10/2013   Procedure: TRANSESOPHAGEAL ECHOCARDIOGRAM (TEE);  Surgeon: [name], MD;  Location: [facility] ENDOSCOPY;  Service: Cardiovascular;  Laterality: N/A;  . TUBAL LIGATION      Social History   Social History  . Marital status: Married    Spouse name: N/A  . Number of children: 2  . Years of education: Associates   Occupational History  . Not on file.   Social History Main Topics  . Smoking status: Former Smoker    Packs/day: 1.00    Years: 5.00    Types: Cigarettes    Start date:  07/15/1970    Quit date: 05/11/1975  . Smokeless tobacco: Never Used     Comment: quit smoking 36 years ago  . Alcohol use No     Comment: drank years ago when young  . Drug use: No  . Sexual activity: Not on file   Other Topics Concern  . Not on file   Social History Narrative   Retired Archivist   Married   She has 2 grown children-   Daughter lives in [place]   Son lives in [place]   6 grandchildren        Mobility: Without assistive devices Work history: Not obtained   Allergies  Allergen Reactions  . No Known Allergies     Family History  Problem Relation Age of Onset  . Diabetes Mother   . Stroke Mother   . Diabetes Father   . Heart disease Father     CHF  . Cancer Father     kidney  . Diabetes Sister   . Diabetes Brother   . Hypertension Daughter      Prior to Admission medications   Medication Sig Start Date End Date Taking? Authorizing Provider  acetaminophen (TYLENOL) 325  MG tablet Take 650 mg by mouth every 6 (six) hours as needed for mild pain.    Yes Historical Provider, MD  amLODipine (NORVASC) 10 MG tablet TAKE ONE TABLET BY MOUTH ONCE DAILY 02/04/16  Yes Debbrah Alar, NP  atorvastatin (LIPITOR) 80 MG tablet TAKE ONE TABLET BY MOUTH ONCE DAILY AT 6 PM 03/15/16  Yes Debbrah Alar, NP  diphenhydrAMINE (BENADRYL) 25 mg capsule Take 25 mg by mouth every 8 (eight) hours as needed for allergies.    Yes Historical Provider, MD  metoprolol succinate (TOPROL-XL) 25 MG 24 hr tablet Take 1 tablet (25 mg total) by mouth daily. 03/15/16  Yes Debbrah Alar, NP  oxyCODONE-acetaminophen (PERCOCET/ROXICET) 5-325 MG tablet Take 1-2 tablets by mouth every 4 (four) hours as needed for moderate pain. 03/11/16  Yes Kimberly A Trinh, PA-C  potassium chloride (K-DUR) 10 MEQ tablet Take 1 tablet (10 mEq total) by mouth daily. 03/15/16  Yes Debbrah Alar, NP  warfarin (COUMADIN) 5 MG tablet Take 7.5 mg (1.5 tabs) 03/11/16, 7.5 mg (1.5 tabs)  03/12/16, 7.5 mg (1.5 tabs) 03/13/16, 5 mg (1 tab) on 03/14/16, 5 mg (1 tab) on 03/15/16, INR check on 03/16/16 Patient taking differently: Take 5-7.5 mg by mouth daily. Take 7.5mg  on Wednesday all other days take 5mg . 03/11/16  Yes Alvia Grove, PA-C    Physical Exam: Vitals:   03/19/16 0600 03/19/16 0615 03/19/16 0630 03/19/16 0700  BP: 146/65 141/80 148/71 142/71  Pulse: 76 74 72 78  Resp: 11 16 11 13   Temp:      TempSrc:      SpO2: 98% 98% 98% 99%  Weight:      Height:          Constitutional: NAD, calm, Slightly uncomfortable and very to right carotid endarterectomy operative site discomfort Eyes: PERRL, lids and conjunctivae normal ENMT: Mucous membranes are dry. Posterior pharynx clear of any exudate or lesions.Normal dentition.  Neck: normal, supple, no masses, no thyromegaly Respiratory: clear to auscultation bilaterally, no wheezing, no crackles. Normal respiratory effort. No accessory muscle use.  Cardiovascular: Regular rate and sinus rhythm with first-degree AV block. P-R interval 0.24, no murmurs / rubs / gallops. No extremity edema. 2+ pedal pulses. No carotid bruits.  Abdomen: no tenderness, no masses palpated. No hepatosplenomegaly. Bowel sounds positive.  Musculoskeletal: no clubbing / cyanosis. No joint deformity upper and lower extremities. Good ROM, no contractures. Normal muscle tone.  Skin: no rashes, lesions, ulcers. No induration Neurologic: CN 2-12 grossly intact. Sensation intact, DTR normal. Strength 5/5 x all 4 extremities.  Psychiatric: Normal judgment and insight. Alert and oriented x 3. Normal mood.    Labs on Admission: I have personally reviewed following labs and imaging studies  CBC:  Recent Labs Lab 03/19/16 0318  WBC 5.6  NEUTROABS 3.4  HGB 11.9*  HCT 37.6  MCV 84.3  PLT 027   Basic Metabolic Panel:  Recent Labs Lab 03/19/16 0318  NA 138  K 3.1*  CL 103  CO2 22  GLUCOSE 143*  BUN 12  CREATININE 0.95  CALCIUM 9.7    GFR: Estimated Creatinine Clearance: 58.7 mL/min (by C-G formula based on SCr of 0.95 mg/dL). Liver Function Tests:  Recent Labs Lab 03/19/16 0318  AST 28  ALT 24  ALKPHOS 75  BILITOT 1.2  PROT 8.5*  ALBUMIN 3.7   No results for input(s): LIPASE, AMYLASE in the last 168 hours. No results for input(s): AMMONIA in the last 168 hours. Coagulation Profile:  Recent Labs  Lab 03/16/16 1228 03/19/16 0318  INR 3.3 3.92   Cardiac Enzymes: No results for input(s): CKTOTAL, CKMB, CKMBINDEX, TROPONINI in the last 168 hours. BNP (last 3 results)  Recent Labs  10/10/15 1343 01/20/16 1539  PROBNP 238.0* 136.0*   HbA1C: No results for input(s): HGBA1C in the last 72 hours. CBG: No results for input(s): GLUCAP in the last 168 hours. Lipid Profile: No results for input(s): CHOL, HDL, LDLCALC, TRIG, CHOLHDL, LDLDIRECT in the last 72 hours. Thyroid Function Tests: No results for input(s): TSH, T4TOTAL, FREET4, T3FREE, THYROIDAB in the last 72 hours. Anemia Panel: No results for input(s): VITAMINB12, FOLATE, FERRITIN, TIBC, IRON, RETICCTPCT in the last 72 hours. Urine analysis:    Component Value Date/Time   COLORURINE YELLOW 03/19/2016 0526   APPEARANCEUR CLEAR 03/19/2016 0526   LABSPEC 1.011 03/19/2016 0526   PHURINE 7.5 03/19/2016 0526   GLUCOSEU NEGATIVE 03/19/2016 0526   GLUCOSEU NEGATIVE 07/22/2015 1444   HGBUR NEGATIVE 03/19/2016 0526   BILIRUBINUR NEGATIVE 03/19/2016 0526   KETONESUR NEGATIVE 03/19/2016 0526   PROTEINUR NEGATIVE 03/19/2016 0526   UROBILINOGEN 0.2 07/22/2015 1444   NITRITE NEGATIVE 03/19/2016 0526   LEUKOCYTESUR NEGATIVE 03/19/2016 0526   Sepsis Labs: @LABRCNTIP (procalcitonin:4,lacticidven:4) )No results found for this or any previous visit (from the past 240 hour(s)).   Radiological Exams on Admission: Ct Angio Head W Or Wo Contrast  Result Date: 03/19/2016 CLINICAL DATA:  Acute onset mental status change. EXAM: CT ANGIOGRAPHY HEAD AND NECK  TECHNIQUE: Multidetector CT imaging of the head and neck was performed using the standard protocol during bolus administration of intravenous contrast. Multiplanar CT image reconstructions and MIPs were obtained to evaluate the vascular anatomy. Carotid stenosis measurements (when applicable) are obtained utilizing NASCET criteria, using the distal internal carotid diameter as the denominator. CONTRAST:  50 mL Isovue 370 IV COMPARISON:  CTA head and neck 01/29/2016 FINDINGS: CT HEAD FINDINGS Brain: There is an old left cerebellar infarct. There is left MCA distribution encephalomalacia at the site of prior infarct. No CT evidence of acute cortical infarct. No acute hemorrhage. No midline shift or mass effect. No extra-axial collection. Vascular: No hyperdense vessel or unexpected calcification. Skull: Normal. Negative for fracture or focal lesion. Sinuses: Imaged portions are clear. Orbits: Normal. Review of the MIP images confirms the above findings CTA NECK FINDINGS Aortic arch: There is atherosclerotic calcification within the aortic arch. There is a normal branching pattern. The subclavian arteries are normal proximally. There is atherosclerotic calcification within the proximal arch vessels. Right carotid system: There is mild narrowing of the right common carotid artery at the level of the thyroid secondary to mass effect from adjacent soft tissue. Otherwise, no hemodynamically significant stenosis. The right internal carotid artery is normal. Left carotid system: There is mild atherosclerotic calcification of the left carotid bifurcation without hemodynamically significant stenosis. Vertebral arteries: There is narrowing of the right vertebral artery origin secondary to atherosclerotic calcification. There is mild calcification of the left vertebral artery origin without significant stenosis. The vertebral system is right dominant. The V2, V3 and V4 segments of the vertebral arteries are normal to the  confluence with the basilar artery. Skeleton: There is no bony spinal canal stenosis. No lytic or blastic lesions. Other neck: There is marked soft tissue prominence within the right carotid space, which is likely secondary to recent right carotid endarterectomy performed March 10, 2016. Upper chest: There is a ground-glass opacity measuring 4 mm in the left upper lobe. Review of the MIP images confirms the  above findings CTA HEAD FINDINGS Anterior circulation: --Intracranial internal carotid arteries: Mild calcification of the internal carotid arteries at the skullbase. --Anterior cerebral arteries: Normal. --Middle cerebral arteries: Normal. --Posterior communicating arteries: Absent bilaterally. Posterior circulation: --Posterior cerebral arteries: Normal. --Superior cerebellar arteries: Normal. --Basilar artery: Normal. --Anterior inferior cerebellar arteries: Not visualized, which is not uncommon. --Posterior inferior cerebellar arteries: Normal. Venous sinuses: As permitted by contrast timing, patent. Anatomic variants: None. Delayed phase: No abnormal parenchymal enhancement. Review of the MIP images confirms the above findings IMPRESSION: 1. No acute intracranial abnormality. 2. 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 3. Normal CTA of the circle of Willis and intracranial arteries. 4. Status post right carotid endarterectomy with greatly improved patency. 5. No hemodynamically significant stenosis of the left carotid system. Electronically Signed   By: Ulyses Jarred M.D.   On: 03/19/2016 06:27   Ct Angio Neck W And/or Wo Contrast  Result Date: 03/19/2016 CLINICAL DATA:  Acute onset mental status change. EXAM: CT ANGIOGRAPHY HEAD AND NECK TECHNIQUE: Multidetector CT imaging of the head and neck was performed using the standard protocol during bolus administration of intravenous contrast. Multiplanar CT image reconstructions and MIPs were obtained to evaluate the vascular anatomy.  Carotid stenosis measurements (when applicable) are obtained utilizing NASCET criteria, using the distal internal carotid diameter as the denominator. CONTRAST:  50 mL Isovue 370 IV COMPARISON:  CTA head and neck 01/29/2016 FINDINGS: CT HEAD FINDINGS Brain: There is an old left cerebellar infarct. There is left MCA distribution encephalomalacia at the site of prior infarct. No CT evidence of acute cortical infarct. No acute hemorrhage. No midline shift or mass effect. No extra-axial collection. Vascular: No hyperdense vessel or unexpected calcification. Skull: Normal. Negative for fracture or focal lesion. Sinuses: Imaged portions are clear. Orbits: Normal. Review of the MIP images confirms the above findings CTA NECK FINDINGS Aortic arch: There is atherosclerotic calcification within the aortic arch. There is a normal branching pattern. The subclavian arteries are normal proximally. There is atherosclerotic calcification within the proximal arch vessels. Right carotid system: There is mild narrowing of the right common carotid artery at the level of the thyroid secondary to mass effect from adjacent soft tissue. Otherwise, no hemodynamically significant stenosis. The right internal carotid artery is normal. Left carotid system: There is mild atherosclerotic calcification of the left carotid bifurcation without hemodynamically significant stenosis. Vertebral arteries: There is narrowing of the right vertebral artery origin secondary to atherosclerotic calcification. There is mild calcification of the left vertebral artery origin without significant stenosis. The vertebral system is right dominant. The V2, V3 and V4 segments of the vertebral arteries are normal to the confluence with the basilar artery. Skeleton: There is no bony spinal canal stenosis. No lytic or blastic lesions. Other neck: There is marked soft tissue prominence within the right carotid space, which is likely secondary to recent right carotid  endarterectomy performed March 10, 2016. Upper chest: There is a ground-glass opacity measuring 4 mm in the left upper lobe. Review of the MIP images confirms the above findings CTA HEAD FINDINGS Anterior circulation: --Intracranial internal carotid arteries: Mild calcification of the internal carotid arteries at the skullbase. --Anterior cerebral arteries: Normal. --Middle cerebral arteries: Normal. --Posterior communicating arteries: Absent bilaterally. Posterior circulation: --Posterior cerebral arteries: Normal. --Superior cerebellar arteries: Normal. --Basilar artery: Normal. --Anterior inferior cerebellar arteries: Not visualized, which is not uncommon. --Posterior inferior cerebellar arteries: Normal. Venous sinuses: As permitted by contrast timing, patent. Anatomic variants: None. Delayed phase: No abnormal  parenchymal enhancement. Review of the MIP images confirms the above findings IMPRESSION: 1. No acute intracranial abnormality. 2. 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 3. Normal CTA of the circle of Willis and intracranial arteries. 4. Status post right carotid endarterectomy with greatly improved patency. 5. No hemodynamically significant stenosis of the left carotid system. Electronically Signed   By: Ulyses Jarred M.D.   On: 03/19/2016 06:27    EKG: (Independently reviewed) sinus rhythm with first-degree AV block and tricky rate 66 bpm, PR interval 0.24 ms, QTC 608 ms, no ischemic changes, borderline voltage criteria for LVH.  Assessment/Plan Principal Problem:   Orthostasis -Suspect secondary to combination of dehydration and vasovagal mediated-patient with reported poor oral intake prior to admission and had also taken diphenhydramine which can cause orthostasis -Neuro checks every 4 hours -Mobilize with assistance only -Orthostatic vital signs every shift -Gentle IV fluid hydration  Active Problems:   Atrial fibrillation on warfarin -Patient currently in sinus  rhythm with first-degree AV block -INR slightly supratherapeutic-pharmacy to manage warfarin-no signs of bleeding on clinical exam -Given prolonged QT have opted to hold beta blocker for now    Hypokalemia -Acute on chronic problem -Replace -Check magnesium -Follow chemistries    Prolonged Q-T interval on ECG -QTC on 12-lead EKG 603 ms which is new prolongation -Uncertain if played role in patient's syncopal episode noting she was tachycardic and hypotensive upon EMS evaluation -Continue telemetry monitoring -Repeat EKG in a.m. -Hold beta blocker/Toprol-XL -Keep potassium greater than 4 and follow-up on magnesium level-if magnesium is low will need to replace    HTN (hypertension) -Controlled -Continue Norvasc    Headache -Patient reports ongoing headache and neck discomfort since surgical procedure -1 time dose Decadron 4 mg IV -Toradol 15 mg IV 1 now and prn    History of CVA (cerebrovascular accident)/Asymptomatic stenosis of right carotid artery s/p right CEA 03/10/16 -Operative site is slightly swollen without evidence of bleeding or infectious process -No bradycardia on telemetry and patient had appropriate tachycardia after orthostatic hypotensive event -CTA head and neck reassuring -Utilize nonnarcotic analgesia and ice pack to treat pain    Constipation -Patient reports no BM since surgery -Milk of magnesia 1 -Colace 100 mg twice a day -MiraLAX 17 g daily -Likely playing a role in patient's poor oral intake prior to admission -Avoid narcotic pain medications if possible    Mitral stenosis/ Mild pulmonary hypertension -Echocardiogram in August 2017 showed normal ejection fraction with severe diffuse thickening and calcification of aortic valve, prior mitral valvuloplasty with severely thickened mitral valve leaflets and associated moderate mitral stenosis-which has progressed since 2016, and pulmonary hypertension was characterized as mild with associated increase  in right ventricular systolic pressure     HLD (hyperlipidemia) -Continue Lipitor      DVT prophylaxis: Warfarin   Code Status: Full   Family Communication:  husband and daughter at bedside  Disposition Plan: Anticipate discharge back to preadmission home environment once medically stable  Consults called: None     Branden Shallenberger L. ANP-BC [facility] Pager [phone]   If 7PM-7AM, please contact night-coverage [email or web address] Password [email or web address]  03/19/2016, 7:56 AM

## 2016-03-19 NOTE — Progress Notes (Signed)
ANTICOAGULATION CONSULT NOTE - Initial Consult  Pharmacy Consult for warfarin Indication: atrial fibrillation  Allergies  Allergen Reactions  . No Known Allergies     Patient Measurements: Height: 5\' 2"  (157.5 cm) Weight: 177 lb (80.3 kg) IBW/kg (Calculated) : 50.1  Vital Signs: Temp: 97.7 F (36.5 C) (11/10 0305) Temp Source: Oral (11/10 0305) BP: 142/71 (11/10 0700) Pulse Rate: 78 (11/10 0700)  Labs:  Recent Labs  03/16/16 1228 03/19/16 0318  HGB  --  11.9*  HCT  --  37.6  PLT  --  183  APTT  --  40*  LABPROT  --  39.4*  INR 3.3 3.92  CREATININE  --  0.95    Estimated Creatinine Clearance: 58.7 mL/min (by C-G formula based on SCr of 0.95 mg/dL).   Medical History: Past Medical History:  Diagnosis Date  . Atrial fibrillation ([place])   . Complication of anesthesia    difficult to wake up from anesthesia  . Heart murmur   . History of rheumatic fever    as a child  . Hyperlipidemia   . Hypertension   . Mitral stenosis   . OSA (obstructive sleep apnea)    mild per sleep study 2008  . Pneumonia    double pneumonia once  . Stroke [facility])    Assessment: 24 yof presented to the ED with AMS. She is on chronic coumadin for history of afib. INR today is supratherapeutic at 3.92. Hgb is slightly low and platelets are WNL. CT head is negative for bleeding. No other bleeding noted.  Goal of Therapy:  INR 2-3 Monitor platelets by anticoagulation protocol: Yes   Plan:  - No coumadin tonight - Daily INR  Roey Coopman, Rande Lawman 03/19/2016,7:22 AM

## 2016-03-19 NOTE — ED Notes (Signed)
Ordered lunch

## 2016-03-19 NOTE — Progress Notes (Signed)
[facility]ist [facility]  Patient name: Marie Malone MRN: [id number] DOB: Aug 09, 1950 Sex: female  REASON FOR CONSULT: s/p recent right carotid endarterectomy, AMS, consult is from Dr. Aggie Moats  HPI: Marie Malone is a 65 y.o. female, who underwent recent right carotid endarterectomy on 03/10/16 by [name] for asymptomatic right carotid stenosis. She has a history of remote CVA in 2014 where she have slurred speech, facial droop and right arm weakness. She occasionally has fatigue in the right arm and slurs her words. She had some brief bradycardia on POD 1. Otherwise, her post-op course was unremarkable and she was discharged home on POD 1. She was discharged on a lovenox bridge to coumadin (for atrial fibrillation) and had INR follow-up on 03/16/16.   Earlier this morning around 1-2 am, the patient woke up and asked her husband for water. When the patient got up to a sitting position to wait for her husband, she "was unresponsive." She is unsure whether or not she loss consciousness. She says that her "blood pressure dropped." On EMS assessment, she was tachycardic and hypotensive. She denies any amaurosis fugax, sudden onset weakness/numbness of the extremities or expressive aphasia. She reports having poor appetite since being discharged home.  When asked about her right neck swelling, she says that it started swelling in the past 1-2 days. She denies any trouble swallowing or difficulty breathing. She denies chest pain and palpitations. She says that she hasn't had a BM since being discharged on 03/11/16. She denies any abdominal pain. She does mention that her feet hurt.     Past Medical History:  Diagnosis Date  . Atrial fibrillation ([place])   . Complication of anesthesia    difficult to wake up from anesthesia  . Heart murmur   . History of rheumatic fever    as a child  . Hyperlipidemia   . Hypertension   . Mitral stenosis   . OSA (obstructive sleep apnea)    mild per sleep study 2008  . Pneumonia    double pneumonia once  . Stroke [facility])     Family History  Problem Relation Age of Onset  . Diabetes Mother   . Stroke Mother   . Diabetes Father   . Heart disease Father     CHF  . Cancer Father     kidney  . Diabetes Sister   . Diabetes Brother   . Hypertension Daughter     SOCIAL HISTORY: Social History   Social History  . Marital status: Married    Spouse name: N/A  . Number of children: 2  . Years of education: Associates   Occupational History  . Not on file.   Social History Main Topics  . Smoking status: Former Smoker    Packs/day: 1.00    Years: 5.00    Types: Cigarettes    Start date: 07/15/1970    Quit date: 05/11/1975  . Smokeless tobacco: Never Used     Comment: quit smoking 36 years ago  . Alcohol use No     Comment: drank years ago when young  . Drug use: No  . Sexual activity: Not on file   Other Topics Concern  . Not on file   Social History Narrative   Retired Archivist   Married   She has 2 grown children-   Daughter lives in [place]   Son lives in [place]   6 grandchildren        Allergies  Allergen Reactions  . No Known Allergies     Current Facility-Administered Medications  Medication Dose Route Frequency Provider Last Rate Last Dose  . 0.9 %  sodium chloride infusion   Intravenous Continuous Samella Parr, NP      . Derrill Memo ON 03/20/2016] Warfarin - Pharmacist Dosing Inpatient   Does not apply q1800 Para March, [facility]       Current Outpatient Prescriptions  Medication Sig Dispense Refill  . acetaminophen (TYLENOL) 325 MG tablet Take 650 mg by mouth every 6 (six) hours as needed for mild pain.     [name] Kitchen amLODipine (NORVASC) 10 MG tablet TAKE ONE TABLET BY MOUTH ONCE DAILY 30 tablet 5  . atorvastatin (LIPITOR) 80 MG tablet TAKE ONE TABLET BY MOUTH ONCE DAILY AT 6 PM 30 tablet 5  . diphenhydrAMINE (BENADRYL) 25 mg capsule Take 25 mg by mouth every 8 (eight) hours as  needed for allergies.     . metoprolol succinate (TOPROL-XL) 25 MG 24 hr tablet Take 1 tablet (25 mg total) by mouth daily. 30 tablet 5  . oxyCODONE-acetaminophen (PERCOCET/ROXICET) 5-325 MG tablet Take 1-2 tablets by mouth every 4 (four) hours as needed for moderate pain. 10 tablet 0  . potassium chloride (K-DUR) 10 MEQ tablet Take 1 tablet (10 mEq total) by mouth daily. 30 tablet 5  . warfarin (COUMADIN) 5 MG tablet Take 7.5 mg (1.5 tabs) 03/11/16, 7.5 mg (1.5 tabs) 03/12/16, 7.5 mg (1.5 tabs) 03/13/16, 5 mg (1 tab) on 03/14/16, 5 mg (1 tab) on 03/15/16, INR check on 03/16/16 (Patient taking differently: Take 5-7.5 mg by mouth daily. Take 7.5mg  on Wednesday all other days take 5mg .) 45 tablet 3     PHYSICAL EXAM: Vitals:   03/19/16 0630 03/19/16 0700 03/19/16 0815 03/19/16 0900  BP: 148/71 142/71 154/81 140/85  Pulse: 72 78 74 96  Resp: 11 13 15 14   Temp:      TempSrc:      SpO2: 98% 99% 97% 99%  Weight:      Height:        GENERAL: The patient is a well-nourished female, in no acute distress. The vital signs are documented above. CARDIAC: There is a regular rate and rhythm.  VASCULAR: Moderate right neck hematoma. Incision is clean. 2+ dorsalis pedis pulses bilaterally.  PULMONARY: There is good air exchange bilaterally. Normal respiratory effort.  MUSCULOSKELETAL: There are no major deformities or cyanosis. NEUROLOGIC: Tongue slightly deviated to right. No facial droop. 5/5 strength all extremities.  SKIN: There are no ulcers or rashes noted. PSYCHIATRIC: The patient has a normal affect.  DATA:  CTA Head/Neck 03/19/16  FINDINGS: CT HEAD FINDINGS  Brain: There is an old left cerebellar infarct. There is left MCA distribution encephalomalacia at the site of prior infarct. No CT evidence of acute cortical infarct. No acute hemorrhage. No midline shift or mass effect. No extra-axial collection.  Vascular: No hyperdense vessel or unexpected calcification.  Skull: Normal.  Negative for fracture or focal lesion.  Sinuses: Imaged portions are clear.  Orbits: Normal.  Review of the MIP images confirms the above findings  CTA NECK FINDINGS  Aortic arch: There is atherosclerotic calcification within the aortic arch. There is a normal branching pattern. The subclavian arteries are normal proximally. There is atherosclerotic calcification within the proximal arch vessels.  Right carotid system: There is mild narrowing of the right common carotid artery at the level of the thyroid secondary to mass effect from adjacent soft tissue. Otherwise, no hemodynamically  significant stenosis. The right internal carotid artery is normal.  Left carotid system: There is mild atherosclerotic calcification of the left carotid bifurcation without hemodynamically significant stenosis.  Vertebral arteries: There is narrowing of the right vertebral artery origin secondary to atherosclerotic calcification. There is mild calcification of the left vertebral artery origin without significant stenosis. The vertebral system is right dominant. The V2, V3 and V4 segments of the vertebral arteries are normal to the confluence with the basilar artery.  Skeleton: There is no bony spinal canal stenosis. No lytic or blastic lesions.  Other neck: There is marked soft tissue prominence within the right carotid space, which is likely secondary to recent right carotid endarterectomy performed March 10, 2016.  Upper chest: There is a ground-glass opacity measuring 4 mm in the left upper lobe.  Review of the MIP images confirms the above findings  CTA HEAD FINDINGS  Anterior circulation:  --Intracranial internal carotid arteries: Mild calcification of the internal carotid arteries at the skullbase.  --Anterior cerebral arteries: Normal.  --Middle cerebral arteries: Normal.  --Posterior communicating arteries: Absent bilaterally.  Posterior  circulation:  --Posterior cerebral arteries: Normal.  --Superior cerebellar arteries: Normal.  --Basilar artery: Normal.  --Anterior inferior cerebellar arteries: Not visualized, which is not uncommon.  --Posterior inferior cerebellar arteries: Normal.  Venous sinuses: As permitted by contrast timing, patent.  Anatomic variants: None.  Delayed phase: No abnormal parenchymal enhancement.  Review of the MIP images confirms the above findings  IMPRESSION: 1. No acute intracranial abnormality. 2. 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 3. Normal CTA of the circle of Willis and intracranial arteries. 4. Status post right carotid endarterectomy with greatly improved patency. 5. No hemodynamically significant stenosis of the left carotid system.   MEDICAL ISSUES: AMS Orthostatic hypotension S/p right carotid endarterectomy for asymptomatic carotid stenosis 03/10/16 Right neck hematoma  On CTA, the right carotid endarterectomy site is patent. There is insignificant stenosis on the left. She is neurologically intact. Suspect her AMS is related to orthostatic hypotension given poor oral intake post-op. She is at her baseline mental status upon assessment.  Agree with admission and gentle IV hydration. Her right neck has a moderate sized hematoma. This started in the past 1-2 days. This is likely due to lovenox and coumadin. Her INR is 3.92 today. She has no breathing or swallowing issues. No extravasation on CT. Will monitor right neck hematoma. Dr. Donzetta Matters to evaluate patient.   Virgina Jock, PA-C [facility] [place] [phone]  I have independently interviewed patient and agree with PA assessment and plan above.   Benedict Kue C. Donzetta Matters, MD [facility] [place] Office: [phone] Pager: [phone]

## 2016-03-19 NOTE — ED Notes (Signed)
Attempted report 

## 2016-03-19 NOTE — ED Provider Notes (Signed)
[facility] DEPT Provider Note   CSN: [id number] Arrival date & time: 03/19/16  1941   By signing my name below, I, Evelene Croon, attest that this documentation has been prepared under the direction and in the presence of Julianne Rice, MD . Electronically Signed: Evelene Croon, Scribe. 03/19/2016. 3:12 AM.   History   Chief Complaint Chief Complaint  Patient presents with  . Altered Mental Status   LEVEL 5 CAVEAT DUE TO AMS  The history is provided by the patient, the EMS personnel and the spouse. No language interpreter was used.     HPI Comments:  Marie Malone is a 65 y.o. female brought in by ambulance, who presents to the Emergency Department with family for acute onset AMS. Family states pt took 2 Tylenol PM for pain last night before bed ~ 2300. Pt sat up ~0145 this AM and asked for water and when her husband returned she was staring off and and unresponsive; no seizure-like activity. Pt had a right carotid endarterectomy on 03/10/2016. Noted to be tachycardic and hypotensive on scene by EMS. Given IV fluids with improvement of hypotension. She notes increased pain to the incision site. No recent trauma, fever or chills.  Vascular Surgeon Trula Slade  Past Medical History:  Diagnosis Date  . Atrial fibrillation ([place])   . Complication of anesthesia    difficult to wake up from anesthesia  . Heart murmur   . History of rheumatic fever    as a child  . Hyperlipidemia   . Hypertension   . Mitral stenosis   . OSA (obstructive sleep apnea)    mild per sleep study 2008  . Pneumonia    double pneumonia once  . Stroke [facility])     Patient Active Problem List   Diagnosis Date Noted  . Orthostasis 03/19/2016  . Asymptomatic stenosis of right carotid artery 03/10/2016  . Carotid stenosis 04/09/2014  . Cerebral infarction due to embolism of left middle cerebral artery ([place]) 04/09/2014  . HLD (hyperlipidemia) 04/09/2014  . Valvular vegetation 04/09/2014  . Urinary  urgency 12/03/2013  . Hypokalemia 07/24/2013  . Encounter for therapeutic drug monitoring 06/04/2013  . CVA (cerebral infarction) 04/22/2013  . Atrial fibrillation ([place]) 04/19/2013  . Cardiac device in situ 04/19/2013  . Endocarditis 04/13/2013  . Hyperlipidemia 04/12/2013  . Cerebrovascular disease 04/12/2013  . S/P aortic valvotomy 2008 04/06/2013  . Moderate to severe pulmonary hypertension 04/06/2013  . Mitral stenosis 10/25/2012  . Routine general medical examination at a health care facility 09/22/2012  . HTN (hypertension) 08/30/2012  . History of rheumatic fever 08/30/2012  . Chest pain 08/30/2012    Past Surgical History:  Procedure Laterality Date  . APPENDECTOMY  1989   appendix ruptured,had peritonitis  . CARDIAC SURGERY  2008   "balloon surgery at [facility]"  . COLONOSCOPY W/ POLYPECTOMY    . ENDARTERECTOMY Right 03/10/2016   Procedure: ENDARTERECTOMY CAROTID RIGHT;  Surgeon: Serafina Mitchell, MD;  Location: [facility];  Service: Vascular;  Laterality: Right;  . EP IMPLANTABLE DEVICE N/A 03/11/2016   Procedure: Loop Recorder Removal;  Surgeon: Deboraha Sprang, MD;  Location: [facility] CV LAB;  Service: Cardiovascular;  Laterality: N/A;  . 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 LOOP RECORDER IMPLANT N/A 04/10/2013   Procedure: LOOP RECORDER IMPLANT;  Surgeon: Coralyn Mark, MD;  Location: [facility] CATH LAB;  Service: Cardiovascular;  Laterality: N/A;  . PATCH ANGIOPLASTY Right  03/10/2016   Procedure: PATCH ANGIOPLASTY CAROTID RIGHT USING Rueben Bash BIOLOGIC PATCH;  Surgeon: Serafina Mitchell, MD;  Location: [facility];  Service: Vascular;  Laterality: Right;  . TEE WITHOUT CARDIOVERSION N/A 04/10/2013   Procedure: TRANSESOPHAGEAL ECHOCARDIOGRAM (TEE);  Surgeon: [name], MD;  [facility] END[facility];  Service: Cardiovascular;  Laterality: N/A;  . TUBAL LIGATION      OB History    No data available       Home Medications     Prior to Admission medications   Medication Sig Start Date End Date Taking? Authorizing Provider  acetaminophen (TYLENOL) 325 MG tablet Take 650 mg by mouth as needed for mild pain.     Historical Provider, MD  amLODipine (NORVASC) 10 MG tablet TAKE ONE TABLET BY MOUTH ONCE DAILY 02/04/16   Debbrah Alar, NP  aspirin EC 81 MG tablet Take 81 mg by mouth daily. will start taking 03-03-16 prior to procedure    Historical Provider, MD  atorvastatin (LIPITOR) 80 MG tablet TAKE ONE TABLET BY MOUTH ONCE DAILY AT 6 PM 03/15/16   Debbrah Alar, NP  diphenhydrAMINE (BENADRYL) 25 mg capsule Take 25 mg by mouth as needed for allergies.     Historical Provider, MD  enoxaparin (LOVENOX) 80 MG/0.8ML injection Inject 0.8 mLs (80 mg total) into the skin every 12 (twelve) hours. Will start injections on 03-12-16 03/12/16   Alvia Grove, PA-C  metoprolol succinate (TOPROL-XL) 25 MG 24 hr tablet Take 1 tablet (25 mg total) by mouth daily. 03/15/16   Debbrah Alar, NP  oxyCODONE-acetaminophen (PERCOCET/ROXICET) 5-325 MG tablet Take 1-2 tablets by mouth every 4 (four) hours as needed for moderate pain. 03/11/16   Alvia Grove, PA-C  potassium chloride (K-DUR) 10 MEQ tablet Take 1 tablet (10 mEq total) by mouth daily. 03/15/16   Debbrah Alar, NP  warfarin (COUMADIN) 5 MG tablet Take 7.5 mg (1.5 tabs) 03/11/16, 7.5 mg (1.5 tabs) 03/12/16, 7.5 mg (1.5 tabs) 03/13/16, 5 mg (1 tab) on 03/14/16, 5 mg (1 tab) on 03/15/16, INR check on 03/16/16 03/11/16   Alvia Grove, PA-C    Family History Family History  Problem Relation Age of Onset  . Diabetes Mother   . Stroke Mother   . Diabetes Father   . Heart disease Father     CHF  . Cancer Father     kidney  . Diabetes Sister   . Diabetes Brother   . Hypertension Daughter     Social History Social History  Substance Use Topics  . Smoking status: Former Smoker    Packs/day: 1.00    Years: 5.00    Types: Cigarettes    Start date: 07/15/1970     Quit date: 05/11/1975  . Smokeless tobacco: Never Used     Comment: quit smoking 36 years ago  . Alcohol use No     Comment: drank years ago when young     Allergies   No known allergies   Review of Systems Review of Systems  Unable to perform ROS: Mental status change  All other systems reviewed and are negative.   Physical Exam Updated Vital Signs BP 148/71   Pulse 72   Temp 97.7 F (36.5 C) (Oral)   Resp 11   Ht 5\' 2"  (1.575 m)   Wt 177 lb (80.3 kg)   SpO2 98%   BMI 32.37 kg/m   Physical Exam  Constitutional: She appears well-developed and well-nourished. No distress.  HENT:  Head: Normocephalic and  atraumatic.  Mouth/Throat: Oropharynx is clear and moist. No oropharyngeal exudate.  Eyes: EOM are normal. Pupils are equal, round, and reactive to light.  Neck: Normal range of motion. Neck supple.  Patient with surgical scar to the right anterior neck with diffuse swelling. Pulsatile. No erythema or warmth. No bruits appreciated.  Cardiovascular: Normal rate and regular rhythm.  Exam reveals no gallop and no friction rub.   No murmur heard. Pulmonary/Chest: Effort normal and breath sounds normal.  Abdominal: Soft. Bowel sounds are normal. There is no tenderness. There is no rebound and no guarding.  Musculoskeletal: Normal range of motion. She exhibits no edema or tenderness.  Neurological:  Lying on stretcher with eyes closed. Opens eyes to voice and follows commands. 5/5 motor in all extremities. Sensation fully intact. Cranial nerves II through XII grossly intact. No facial asymmetry noted. Speaks in a clear voice. Bilateral finger-to-nose intact.  Skin: Skin is warm and dry. Capillary refill takes less than 2 seconds. No rash noted. No erythema.  Psychiatric: She has a normal mood and affect. Her behavior is normal.  Nursing note and vitals reviewed.    ED Treatments / Results  DIAGNOSTIC STUDIES:  Oxygen Saturation is 96% on RA, normal by my interpretation.     COORDINATION OF CARE:  3:09 AM Discussed treatment plan with pt at bedside and pt agreed to plan.  Labs (all labs ordered are listed, but only abnormal results are displayed) Labs Reviewed  PROTIME-INR - Abnormal; Notable for the following:       Result Value   Prothrombin Time 39.4 (*)    All other components within normal limits  APTT - Abnormal; Notable for the following:    aPTT 40 (*)    All other components within normal limits  CBC - Abnormal; Notable for the following:    Hemoglobin 11.9 (*)    All other components within normal limits  COMPREHENSIVE METABOLIC PANEL - Abnormal; Notable for the following:    Potassium 3.1 (*)    Glucose, Bld 143 (*)    Total Protein 8.5 (*)    All other components within normal limits  ETHANOL  DIFFERENTIAL  RAPID URINE DRUG SCREEN, HOSP PERFORMED  URINALYSIS, ROUTINE W REFLEX MICROSCOPIC (NOT AT [facility])  I-STAT TROPOININ, ED    EKG  EKG Interpretation None       Radiology Ct Angio Head W Or Wo Contrast  Result Date: 03/19/2016 CLINICAL DATA:  Acute onset mental status change. EXAM: CT ANGIOGRAPHY HEAD AND NECK TECHNIQUE: Multidetector CT imaging of the head and neck was performed using the standard protocol during bolus administration of intravenous contrast. Multiplanar CT image reconstructions and MIPs were obtained to evaluate the vascular anatomy. Carotid stenosis measurements (when applicable) are obtained utilizing NASCET criteria, using the distal internal carotid diameter as the denominator. CONTRAST:  50 mL Isovue 370 IV COMPARISON:  CTA head and neck 01/29/2016 FINDINGS: CT HEAD FINDINGS Brain: There is an old left cerebellar infarct. There is left MCA distribution encephalomalacia at the site of prior infarct. No CT evidence of acute cortical infarct. No acute hemorrhage. No midline shift or mass effect. No extra-axial collection. Vascular: No hyperdense vessel or unexpected calcification. Skull: Normal. Negative for  fracture or focal lesion. Sinuses: Imaged portions are clear. Orbits: Normal. Review of the MIP images confirms the above findings CTA NECK FINDINGS Aortic arch: There is atherosclerotic calcification within the aortic arch. There is a normal branching pattern. The subclavian arteries are normal proximally. There is atherosclerotic  calcification within the proximal arch vessels. Right carotid system: There is mild narrowing of the right common carotid artery at the level of the thyroid secondary to mass effect from adjacent soft tissue. Otherwise, no hemodynamically significant stenosis. The right internal carotid artery is normal. Left carotid system: There is mild atherosclerotic calcification of the left carotid bifurcation without hemodynamically significant stenosis. Vertebral arteries: There is narrowing of the right vertebral artery origin secondary to atherosclerotic calcification. There is mild calcification of the left vertebral artery origin without significant stenosis. The vertebral system is right dominant. The V2, V3 and V4 segments of the vertebral arteries are normal to the confluence with the basilar artery. Skeleton: There is no bony spinal canal stenosis. No lytic or blastic lesions. Other neck: There is marked soft tissue prominence within the right carotid space, which is likely secondary to recent right carotid endarterectomy performed March 10, 2016. Upper chest: There is a ground-glass opacity measuring 4 mm in the left upper lobe. Review of the MIP images confirms the above findings CTA HEAD FINDINGS Anterior circulation: --Intracranial internal carotid arteries: Mild calcification of the internal carotid arteries at the skullbase. --Anterior cerebral arteries: Normal. --Middle cerebral arteries: Normal. --Posterior communicating arteries: Absent bilaterally. Posterior circulation: --Posterior cerebral arteries: Normal. --Superior cerebellar arteries: Normal. --Basilar artery: Normal.  --Anterior inferior cerebellar arteries: Not visualized, which is not uncommon. --Posterior inferior cerebellar arteries: Normal. Venous sinuses: As permitted by contrast timing, patent. Anatomic variants: None. Delayed phase: No abnormal parenchymal enhancement. Review of the MIP images confirms the above findings IMPRESSION: 1. No acute intracranial abnormality. 2. 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 3. Normal CTA of the circle of Willis and intracranial arteries. 4. Status post right carotid endarterectomy with greatly improved patency. 5. No hemodynamically significant stenosis of the left carotid system. Electronically Signed   By: Ulyses Jarred M.D.   On: 03/19/2016 06:27   Ct Angio Neck W And/or Wo Contrast  Result Date: 03/19/2016 CLINICAL DATA:  Acute onset mental status change. EXAM: CT ANGIOGRAPHY HEAD AND NECK TECHNIQUE: Multidetector CT imaging of the head and neck was performed using the standard protocol during bolus administration of intravenous contrast. Multiplanar CT image reconstructions and MIPs were obtained to evaluate the vascular anatomy. Carotid stenosis measurements (when applicable) are obtained utilizing NASCET criteria, using the distal internal carotid diameter as the denominator. CONTRAST:  50 mL Isovue 370 IV COMPARISON:  CTA head and neck 01/29/2016 FINDINGS: CT HEAD FINDINGS Brain: There is an old left cerebellar infarct. There is left MCA distribution encephalomalacia at the site of prior infarct. No CT evidence of acute cortical infarct. No acute hemorrhage. No midline shift or mass effect. No extra-axial collection. Vascular: No hyperdense vessel or unexpected calcification. Skull: Normal. Negative for fracture or focal lesion. Sinuses: Imaged portions are clear. Orbits: Normal. Review of the MIP images confirms the above findings CTA NECK FINDINGS Aortic arch: There is atherosclerotic calcification within the aortic arch. There is a normal branching  pattern. The subclavian arteries are normal proximally. There is atherosclerotic calcification within the proximal arch vessels. Right carotid system: There is mild narrowing of the right common carotid artery at the level of the thyroid secondary to mass effect from adjacent soft tissue. Otherwise, no hemodynamically significant stenosis. The right internal carotid artery is normal. Left carotid system: There is mild atherosclerotic calcification of the left carotid bifurcation without hemodynamically significant stenosis. Vertebral arteries: There is narrowing of the right vertebral artery origin secondary to atherosclerotic calcification.  There is mild calcification of the left vertebral artery origin without significant stenosis. The vertebral system is right dominant. The V2, V3 and V4 segments of the vertebral arteries are normal to the confluence with the basilar artery. Skeleton: There is no bony spinal canal stenosis. No lytic or blastic lesions. Other neck: There is marked soft tissue prominence within the right carotid space, which is likely secondary to recent right carotid endarterectomy performed March 10, 2016. Upper chest: There is a ground-glass opacity measuring 4 mm in the left upper lobe. Review of the MIP images confirms the above findings CTA HEAD FINDINGS Anterior circulation: --Intracranial internal carotid arteries: Mild calcification of the internal carotid arteries at the skullbase. --Anterior cerebral arteries: Normal. --Middle cerebral arteries: Normal. --Posterior communicating arteries: Absent bilaterally. Posterior circulation: --Posterior cerebral arteries: Normal. --Superior cerebellar arteries: Normal. --Basilar artery: Normal. --Anterior inferior cerebellar arteries: Not visualized, which is not uncommon. --Posterior inferior cerebellar arteries: Normal. Venous sinuses: As permitted by contrast timing, patent. Anatomic variants: None. Delayed phase: No abnormal parenchymal  enhancement. Review of the MIP images confirms the above findings IMPRESSION: 1. No acute intracranial abnormality. 2. 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 3. Normal CTA of the circle of Willis and intracranial arteries. 4. Status post right carotid endarterectomy with greatly improved patency. 5. No hemodynamically significant stenosis of the left carotid system. Electronically Signed   By: Ulyses Jarred M.D.   On: 03/19/2016 06:27    Procedures Procedures (including critical care time)  Medications Ordered in ED Medications  sodium chloride 0.9 % 1,000 mL with potassium chloride 80 mEq infusion (not administered)  sodium chloride 0.9 % bolus 500 mL (0 mLs Intravenous Stopped 03/19/16 0428)  iopamidol (ISOVUE-370) 76 % injection (50 mLs  Contrast Given 03/19/16 0455)  sodium chloride 0.9 % bolus 500 mL (500 mLs Intravenous New Bag/Given 03/19/16 9798)     Initial Impression / Assessment and Plan / ED Course  I have reviewed the triage vital signs and the nursing notes.  Pertinent labs & imaging results that were available during my care of the patient were reviewed by me and considered in my medical decision making (see chart for details).  Clinical Course    Patient with hypertensive episode while sitting up in the emergency department. Improved when lying flat. CT angiogram of the neck and head without any acute findings. Discussed with [facility] hospitalist service and will admit to observation telemetry bed. Initiated IV potassium replacement emergency department.   Final Clinical Impressions(s) / ED Diagnoses   Final diagnoses:  Orthostasis  Altered mental status, unspecified altered mental status type  Hypokalemia    New Prescriptions New Prescriptions   No medications on file   I personally performed the services described in this documentation, which was scribed in my presence. The recorded information has been reviewed and is accurate.       Julianne Rice, MD 03/19/16 0700

## 2016-03-19 NOTE — ED Notes (Signed)
Breakfast ordered 

## 2016-03-20 ENCOUNTER — Telehealth: Payer: Self-pay | Admitting: Family

## 2016-03-20 DIAGNOSIS — I951 Orthostatic hypotension: Secondary | ICD-10-CM | POA: Diagnosis not present

## 2016-03-20 LAB — BASIC METABOLIC PANEL
ANION GAP: 8 (ref 5–15)
BUN: 13 mg/dL (ref 6–20)
CALCIUM: 9.6 mg/dL (ref 8.9–10.3)
CO2: 24 mmol/L (ref 22–32)
Chloride: 107 mmol/L (ref 101–111)
Creatinine, Ser: 0.75 mg/dL (ref 0.44–1.00)
Glucose, Bld: 93 mg/dL (ref 65–99)
Potassium: 5.3 mmol/L — ABNORMAL HIGH (ref 3.5–5.1)
Sodium: 139 mmol/L (ref 135–145)

## 2016-03-20 LAB — PROTIME-INR
INR: 3.93
PROTHROMBIN TIME: 39.5 s — AB (ref 11.4–15.2)

## 2016-03-20 MED ORDER — DOCUSATE SODIUM 100 MG PO CAPS: 100.0000 mg | ORAL_CAPSULE | Freq: Two times a day (BID) | ORAL | 0 refills | Status: DC

## 2016-03-20 MED ORDER — POLYETHYLENE GLYCOL 3350 17 G PO PACK: 17.0000 g | PACK | Freq: Every day | ORAL | 0 refills | Status: DC

## 2016-03-20 NOTE — Progress Notes (Signed)
  Progress Note    03/20/2016 12:19 PM * No surgery found *  Subjective:  No complaints this a.m.  Vitals:   03/20/16 0731 03/20/16 0732  BP: 134/72 128/66  Pulse: 79 81  Resp:    Temp:      Physical Exam: [id number] No neuro deficits R neck with hematoma that is soft and not enlarging  CBC    Component Value Date/Time   WBC 5.6 03/19/2016 0318   RBC 4.46 03/19/2016 0318   HGB 11.9 (L) 03/19/2016 0318   HGB 12.2 02/13/2014 1331   HCT 37.6 03/19/2016 0318   HCT 37.9 02/13/2014 1331   PLT 183 03/19/2016 0318   PLT 157 02/13/2014 1331   MCV 84.3 03/19/2016 0318   MCV 87 02/13/2014 1331   MCH 26.7 03/19/2016 0318   MCHC 31.6 03/19/2016 0318   RDW 15.4 03/19/2016 0318   RDW 14.6 02/13/2014 1331   LYMPHSABS 1.6 03/19/2016 0318   LYMPHSABS 1.1 02/13/2014 1331   MONOABS 0.6 03/19/2016 0318   EOSABS 0.0 03/19/2016 0318   EOSABS 0.1 02/13/2014 1331   BASOSABS 0.0 03/19/2016 0318   BASOSABS 0.0 02/13/2014 1331    BMET    Component Value Date/Time   NA 139 03/20/2016 0445   K 5.3 (H) 03/20/2016 0445   CL 107 03/20/2016 0445   CO2 24 03/20/2016 0445   GLUCOSE 93 03/20/2016 0445   BUN 13 03/20/2016 0445   CREATININE 0.75 03/20/2016 0445   CREATININE 0.98 01/30/2016 1410   CALCIUM 9.6 03/20/2016 0445   GFRNONAA >60 03/20/2016 0445   GFRNONAA 64 12/17/2013 1607   GFRAA >60 03/20/2016 0445   GFRAA 74 12/17/2013 1607    INR    Component Value Date/Time   INR 3.93 03/20/2016 0445     Intake/Output Summary (Last 24 hours) at 03/20/16 1219 Last data filed at 03/20/16 1149  Gross per 24 hour  Intake            507.5 ml  Output                0 ml  Net            507.5 ml     Assessment:  65 y.o. female is s/p R CEA for aysmptomatic disease, R neck hematoma with elevated inr  Plan: No vascular intervention Keep f/u with Dr. Shaune Leeks C. Donzetta Matters, MD [facility] Office: [phone] Pager:  [phone]  03/20/2016 12:19 PM

## 2016-03-20 NOTE — Progress Notes (Addendum)
ANTICOAGULATION CONSULT NOTE - Follow Up Consult  Pharmacy Consult for warfarin Indication: atrial fibrillation  Allergies  Allergen Reactions  . No Known Allergies     Patient Measurements: Height: 5\' 2"  (157.5 cm) Weight: 161 lb 13.1 oz (73.4 kg) IBW/kg (Calculated) : 50.1  Vital Signs: Temp: 97.9 F (36.6 C) (11/11 0552) Temp Source: Oral (11/11 0552) BP: 128/66 (11/11 0732) Pulse Rate: 81 (11/11 0732)  Labs:  Recent Labs  03/19/16 0318 03/20/16 0445  HGB 11.9*  --   HCT 37.6  --   PLT 183  --   APTT 40*  --   LABPROT 39.4* 39.5*  INR 3.92 3.93  CREATININE 0.95 0.75    Estimated Creatinine Clearance: 66.6 mL/min (by C-G formula based on SCr of 0.75 mg/dL).   Medical History: Past Medical History:  Diagnosis Date  . Atrial fibrillation ([place])   . Complication of anesthesia    difficult to wake up from anesthesia  . Heart murmur   . History of rheumatic fever    as a child  . Hyperlipidemia   . Hypertension   . Mitral stenosis   . OSA (obstructive sleep apnea)    mild per sleep study 2008  . Pneumonia    double pneumonia once  . Stroke [facility])    Assessment: 65 yo F presented to the ED on 11/10 with AMS. PTA warfarin for history of afib. INR today remains supratherapeutic at 3.93. Hgb is slightly low and platelets are WNL. CT head is negative for bleeding. No other bleeding noted.  PTA dose: 5 mg daily except 7.5 mg on Wednesdays  Goal of Therapy:  INR 2-3 Monitor platelets by anticoagulation protocol: Yes   Plan:  - Hold warfarin - Daily INR - Monitor s/sx of bleeding   Gwenlyn Perking, PharmD PGY1 Pharmacy Resident Pager: [phone] 03/20/2016 8:58 AM

## 2016-03-20 NOTE — Discharge Summary (Signed)
Physician Discharge Summary  Marie Malone TKW:[id number] DOB: 1951/03/13 DOA: 03/19/2016  PCP: [name], NP  Admit date: 03/19/2016 Discharge date: 03/20/2016  Admitted From: Home Disposition:  Home  Recommendations for Outpatient Follow-up:  1. Follow up with PCP in 1 week 2. Please obtain BMP in one week.  3. Follow up with Coumadin clinic on 11/13 (monday) for further instructions on Coumadin dosing. 4. Follow up with Vascular surgery next week as previously scheduled.   Home Health: No  Equipment/Devices: None   Discharge Condition: Stable CODE STATUS: Full  Diet recommendation: Heart healthy   Brief/Interim Summary: From H&P: Marie Fredricksen Lipfordis a 65 y.o.femalewith medical history significant for hypertension, dyslipidemia, known mitral stenosis and moderate to severe pulmonary hypertension, history of prior CVA, recent right carotid endarterectomy on 03/10/16, Atrial fibrillation on warfarin, prior aortic valvulotomy in 2008. Patient has been having issues with persistent headache and operative site discomfort since the surgical procedure. Patient took 2 Tylenol PM's prior to going to bed around 11 PM and woke up just before 2 AM and ask for water. When her husband returned patient was sitting up in the bed but "staring off"and would not respond to him. No apparent seizure activity including no bowel or bladder incontinence. She did not lose consciousness. EMS was called to the home and the patient was found to be tachycardic and hypotensive. She was given IV fluids with improvement in blood pressure. She was complaining of increased pain at the operative site upon arrival to the ER. Additional questioning revealed that patient has had essentially no appetite since surgery and has not had a bowel movement since the surgery. She has had cramping in her feet. She's not had any nausea vomiting or diarrhea. She's not started any new medications or had changes in the dosages  of her usual medications. She was taking Percocet and immediate postoperative period.   Interim: During her hospitalization, orthostatic vital signs were obtained which were negative. Patient was given gentle IV fluids. She was evaluated by vascular surgery who felt that her surgical site had a moderate hematoma. CTA was unremarkable with patent right carotid. She should follow up outpatient with vascular surgery as previously scheduled. EKG showed prolonged QT interval. Repeat EKG showed resolution of QT prolongation with QTc 470ms. Patient continued to improve in the hospital with no further episodes. Felt that her episode was likely related to orthostasis and hypotension secondary to dehydration and poor oral intake. On the morning of discharge, patient was tolerating breakfast and ambulating well to the bathroom. No other complaints of dizziness, lightheadedness, chest pain, shortness of breath, nausea, vomiting, diarrhea, abdominal pain. She had 3 bowel movements prior to discharge. Due to her supra therapeutic INR, Coumadin was held. She follows with Coumadin clinic and is instructed to follow up with him on Monday 11/13 prior to restarting Coumadin dose.  Discharge Diagnoses:  Principal Problem:   Orthostasis Active Problems:   HTN (hypertension)   Mitral stenosis   Mild pulmonary hypertension   History of CVA (cerebrovascular accident)   Atrial fibrillation (HCC)   Hypokalemia   HLD (hyperlipidemia)   Asymptomatic stenosis of right carotid artery s/p right CEA 03/10/16   Constipation   Prolonged Q-T interval on ECG  Orthostasis -Suspect secondary to combination of dehydration and vasovagal mediated-patient with reported poor oral intake prior to admission and had also taken diphenhydramine which can cause orthostasis -Orthostatic VS negative  -IVF  -No further episodes during hospitalization   Paroxysmal atrial fibrillation on  warfarin -Supratherapeutic INR. Pharmacy consulted for  warfarin management  -Patient currently in sinus rhythm with first-degree AV block -Hold coumadin at time of discharge. Patient follows with coumadin clinic and instructed to follow up with them on Monday 11/13 for further instructions on coumadin dosing.   Prolonged Q-T interval on ECG -QTC on 12-lead EKG 603 ms which is new. Repeat EKG this morning with 457 ms   HTN -Controlled -Continue Norvasc, metoprolol   History of CVA (cerebrovascular accident)/Asymptomatic stenosis of right carotid artery s/p right CEA 03/10/16 -CTA with patent right carotid endarterecomy site  -Vascular sx consulted: Operative site with hematoma, monitor site -Follow up with vascular surgery   Constipation -Colace 100 mg twice a day -MiraLAX 17 g daily  Mitral stenosis/Mildpulmonary hypertension -Echocardiogram in August 2017 showed normal ejection fraction with severe diffuse thickening and calcification of aortic valve, prior mitral valvuloplasty with severely thickened mitral valve leaflets and associated moderate mitral stenosis-which has progressed since 2016, and pulmonary hypertension was characterized as mild with associated increase in right ventricular systolic pressure   HLD  -Continue Lipitor   Discharge Instructions  Discharge Instructions    Call MD for:  persistant dizziness or light-headedness    Complete by:  As directed    Diet - low sodium heart healthy    Complete by:  As directed    Discharge instructions    Complete by:  As directed    Recommendations for Outpatient Follow-up:  Follow up with PCP in 1 week Please obtain BMP in one week.  Follow up with Coumadin clinic on 11/13 (monday) for further instructions on Coumadin dosing. Follow up with Vascular surgery next week as previously scheduled.   Increase activity slowly    Complete by:  As directed        Medication List    STOP taking these medications   BENADRYL 25 mg capsule Generic drug:   diphenhydrAMINE   potassium chloride 10 MEQ tablet Commonly known as:  K-DUR   warfarin 5 MG tablet Commonly known as:  COUMADIN     TAKE these medications   amLODipine 10 MG tablet Commonly known as:  NORVASC TAKE ONE TABLET BY MOUTH ONCE DAILY   atorvastatin 80 MG tablet Commonly known as:  LIPITOR TAKE ONE TABLET BY MOUTH ONCE DAILY AT 6 PM   docusate sodium 100 MG capsule Commonly known as:  COLACE Take 1 capsule (100 mg total) by mouth 2 (two) times daily.   metoprolol succinate 25 MG 24 hr tablet Commonly known as:  TOPROL-XL Take 1 tablet (25 mg total) by mouth daily.   oxyCODONE-acetaminophen 5-325 MG tablet Commonly known as:  PERCOCET/ROXICET Take 1-2 tablets by mouth every 4 (four) hours as needed for moderate pain.   polyethylene glycol packet Commonly known as:  MIRALAX / GLYCOLAX Take 17 g by mouth daily.   TYLENOL 325 MG tablet Generic drug:  acetaminophen Take 650 mg by mouth every 6 (six) hours as needed for mild pain.      Follow-up Information    [name], NP. Schedule an appointment as soon as possible for a visit in 1 week(s).   Specialty:  Internal Medicine Contact information: [address] [place] [address] [phone]        [facility] Clinic. Schedule an appointment as soon as possible for a visit on 03/22/2016.   Why:  Hold coumadin until repeat INR on Monday 11/13. Await further instructions from Coumadin Clinic for dosing.  Allergies  Allergen Reactions  . No Known Allergies     Consultations:  Vascular sx    Procedures/Studies: Ct Angio Head W Or Wo Contrast  Result Date: 03/19/2016 CLINICAL DATA:  Acute onset mental status change. EXAM: CT ANGIOGRAPHY HEAD AND NECK TECHNIQUE: Multidetector CT imaging of the head and neck was performed using the standard protocol during bolus administration of intravenous contrast. Multiplanar CT image reconstructions and MIPs were obtained to  evaluate the vascular anatomy. Carotid stenosis measurements (when applicable) are obtained utilizing NASCET criteria, using the distal internal carotid diameter as the denominator. CONTRAST:  50 mL Isovue 370 IV COMPARISON:  CTA head and neck 01/29/2016 FINDINGS: CT HEAD FINDINGS Brain: There is an old left cerebellar infarct. There is left MCA distribution encephalomalacia at the site of prior infarct. No CT evidence of acute cortical infarct. No acute hemorrhage. No midline shift or mass effect. No extra-axial collection. Vascular: No hyperdense vessel or unexpected calcification. Skull: Normal. Negative for fracture or focal lesion. Sinuses: Imaged portions are clear. Orbits: Normal. Review of the MIP images confirms the above findings CTA NECK FINDINGS Aortic arch: There is atherosclerotic calcification within the aortic arch. There is a normal branching pattern. The subclavian arteries are normal proximally. There is atherosclerotic calcification within the proximal arch vessels. Right carotid system: There is mild narrowing of the right common carotid artery at the level of the thyroid secondary to mass effect from adjacent soft tissue. Otherwise, no hemodynamically significant stenosis. The right internal carotid artery is normal. Left carotid system: There is mild atherosclerotic calcification of the left carotid bifurcation without hemodynamically significant stenosis. Vertebral arteries: There is narrowing of the right vertebral artery origin secondary to atherosclerotic calcification. There is mild calcification of the left vertebral artery origin without significant stenosis. The vertebral system is right dominant. The V2, V3 and V4 segments of the vertebral arteries are normal to the confluence with the basilar artery. Skeleton: There is no bony spinal canal stenosis. No lytic or blastic lesions. Other neck: There is marked soft tissue prominence within the right carotid space, which is likely  secondary to recent right carotid endarterectomy performed March 10, 2016. Upper chest: There is a ground-glass opacity measuring 4 mm in the left upper lobe. Review of the MIP images confirms the above findings CTA HEAD FINDINGS Anterior circulation: --Intracranial internal carotid arteries: Mild calcification of the internal carotid arteries at the skullbase. --Anterior cerebral arteries: Normal. --Middle cerebral arteries: Normal. --Posterior communicating arteries: Absent bilaterally. Posterior circulation: --Posterior cerebral arteries: Normal. --Superior cerebellar arteries: Normal. --Basilar artery: Normal. --Anterior inferior cerebellar arteries: Not visualized, which is not uncommon. --Posterior inferior cerebellar arteries: Normal. Venous sinuses: As permitted by contrast timing, patent. Anatomic variants: None. Delayed phase: No abnormal parenchymal enhancement. Review of the MIP images confirms the above findings IMPRESSION: 1. No acute intracranial abnormality. 2. 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 3. Normal CTA of the circle of Willis and intracranial arteries. 4. Status post right carotid endarterectomy with greatly improved patency. 5. No hemodynamically significant stenosis of the left carotid system. Electronically Signed   By: Ulyses Jarred M.D.   On: 03/19/2016 06:27   Dg Abd 1 View  Result Date: 03/19/2016 CLINICAL DATA:  Constipation. EXAM: ABDOMEN - 1 VIEW COMPARISON:  No recent prior. FINDINGS: Soft tissue structures are unremarkable. Ill-defined radiopacity is noted stomach, this may represent debris within the stomach. Stool noted throughout colon suggests constipation. No bowel distention. No free air. Degenerative changes lumbar spine  and both hips. Aortoiliac atherosclerotic vascular calcification. Surgical clips sutures in the pelvis. IMPRESSION: No acute abnormality identified. Prominent amount of stool in the colon suggesting constipation. Electronically  Signed   By: Marcello Moores  Register   On: 03/19/2016 09:56   Ct Angio Neck W And/or Wo Contrast  Result Date: 03/19/2016 CLINICAL DATA:  Acute onset mental status change. EXAM: CT ANGIOGRAPHY HEAD AND NECK TECHNIQUE: Multidetector CT imaging of the head and neck was performed using the standard protocol during bolus administration of intravenous contrast. Multiplanar CT image reconstructions and MIPs were obtained to evaluate the vascular anatomy. Carotid stenosis measurements (when applicable) are obtained utilizing NASCET criteria, using the distal internal carotid diameter as the denominator. CONTRAST:  50 mL Isovue 370 IV COMPARISON:  CTA head and neck 01/29/2016 FINDINGS: CT HEAD FINDINGS Brain: There is an old left cerebellar infarct. There is left MCA distribution encephalomalacia at the site of prior infarct. No CT evidence of acute cortical infarct. No acute hemorrhage. No midline shift or mass effect. No extra-axial collection. Vascular: No hyperdense vessel or unexpected calcification. Skull: Normal. Negative for fracture or focal lesion. Sinuses: Imaged portions are clear. Orbits: Normal. Review of the MIP images confirms the above findings CTA NECK FINDINGS Aortic arch: There is atherosclerotic calcification within the aortic arch. There is a normal branching pattern. The subclavian arteries are normal proximally. There is atherosclerotic calcification within the proximal arch vessels. Right carotid system: There is mild narrowing of the right common carotid artery at the level of the thyroid secondary to mass effect from adjacent soft tissue. Otherwise, no hemodynamically significant stenosis. The right internal carotid artery is normal. Left carotid system: There is mild atherosclerotic calcification of the left carotid bifurcation without hemodynamically significant stenosis. Vertebral arteries: There is narrowing of the right vertebral artery origin secondary to atherosclerotic calcification. There  is mild calcification of the left vertebral artery origin without significant stenosis. The vertebral system is right dominant. The V2, V3 and V4 segments of the vertebral arteries are normal to the confluence with the basilar artery. Skeleton: There is no bony spinal canal stenosis. No lytic or blastic lesions. Other neck: There is marked soft tissue prominence within the right carotid space, which is likely secondary to recent right carotid endarterectomy performed March 10, 2016. Upper chest: There is a ground-glass opacity measuring 4 mm in the left upper lobe. Review of the MIP images confirms the above findings CTA HEAD FINDINGS Anterior circulation: --Intracranial internal carotid arteries: Mild calcification of the internal carotid arteries at the skullbase. --Anterior cerebral arteries: Normal. --Middle cerebral arteries: Normal. --Posterior communicating arteries: Absent bilaterally. Posterior circulation: --Posterior cerebral arteries: Normal. --Superior cerebellar arteries: Normal. --Basilar artery: Normal. --Anterior inferior cerebellar arteries: Not visualized, which is not uncommon. --Posterior inferior cerebellar arteries: Normal. Venous sinuses: As permitted by contrast timing, patent. Anatomic variants: None. Delayed phase: No abnormal parenchymal enhancement. Review of the MIP images confirms the above findings IMPRESSION: 1. No acute intracranial abnormality. 2. 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 3. Normal CTA of the circle of Willis and intracranial arteries. 4. Status post right carotid endarterectomy with greatly improved patency. 5. No hemodynamically significant stenosis of the left carotid system. Electronically Signed   By: Ulyses Jarred M.D.   On: 03/19/2016 06:27     Discharge Exam: Vitals:   03/20/16 0731 03/20/16 0732  BP: 134/72 128/66  Pulse: 79 81  Resp:    Temp:     Vitals:   03/20/16 0552 03/20/16  4562 03/20/16 0731 03/20/16 0732  BP: (!)  140/53 (!) 152/70 134/72 128/66  Pulse: 70 71 79 81  Resp: 18     Temp: 97.9 F (36.6 C)     TempSrc: Oral     SpO2: 100%     Weight:      Height:        General: Pt is alert, awake, not in acute distress Neck: Right sided anterior hematoma s/p CEA Cardiovascular: RRR, S1/S2 +, no rubs, no gallops Respiratory: CTA bilaterally, no wheezing, no rhonchi Abdominal: Soft, NT, ND, bowel sounds + Extremities: no edema, no cyanosis    The results of significant diagnostics from this hospitalization (including imaging, microbiology, ancillary and laboratory) are listed below for reference.     Microbiology: No results found for this or any previous visit (from the past 240 hour(s)).   Labs: BNP (last 3 results) No results for input(s): BNP in the last 8760 hours. Basic Metabolic Panel:  Recent Labs Lab 03/19/16 0318 03/19/16 0420 03/19/16 1028 03/20/16 0445  NA 138  --   --  139  K 3.1*  --   --  5.3*  CL 103  --   --  107  CO2 22  --   --  24  GLUCOSE 143*  --   --  93  BUN 12  --   --  13  CREATININE 0.95  --   --  0.75  CALCIUM 9.7  --   --  9.6  MG  --  2.0  --   --   PHOS  --   --  3.7  --    Liver Function Tests:  Recent Labs Lab 03/19/16 0318  AST 28  ALT 24  ALKPHOS 75  BILITOT 1.2  PROT 8.5*  ALBUMIN 3.7   No results for input(s): LIPASE, AMYLASE in the last 168 hours. No results for input(s): AMMONIA in the last 168 hours. CBC:  Recent Labs Lab 03/19/16 0318  WBC 5.6  NEUTROABS 3.4  HGB 11.9*  HCT 37.6  MCV 84.3  PLT 183   Cardiac Enzymes: No results for input(s): CKTOTAL, CKMB, CKMBINDEX, TROPONINI in the last 168 hours. BNP: Invalid input(s): POCBNP CBG: No results for input(s): GLUCAP in the last 168 hours. D-Dimer No results for input(s): DDIMER in the last 72 hours. Hgb A1c No results for input(s): HGBA1C in the last 72 hours. Lipid Profile No results for input(s): CHOL, HDL, LDLCALC, TRIG, CHOLHDL, LDLDIRECT in the last 72  hours. Thyroid function studies No results for input(s): TSH, T4TOTAL, T3FREE, THYROIDAB in the last 72 hours.  Invalid input(s): FREET3 Anemia work up No results for input(s): VITAMINB12, FOLATE, FERRITIN, TIBC, IRON, RETICCTPCT in the last 72 hours. Urinalysis    Component Value Date/Time   COLORURINE YELLOW 03/19/2016 0526   APPEARANCEUR CLEAR 03/19/2016 0526   LABSPEC 1.011 03/19/2016 0526   PHURINE 7.5 03/19/2016 0526   GLUCOSEU NEGATIVE 03/19/2016 0526   GLUCOSEU NEGATIVE 07/22/2015 1444   HGBUR NEGATIVE 03/19/2016 0526   BILIRUBINUR NEGATIVE 03/19/2016 0526   KETONESUR NEGATIVE 03/19/2016 0526   PROTEINUR NEGATIVE 03/19/2016 0526   UROBILINOGEN 0.2 07/22/2015 1444   NITRITE NEGATIVE 03/19/2016 0526   LEUKOCYTESUR NEGATIVE 03/19/2016 0526   Sepsis Labs Invalid input(s): PROCALCITONIN,  WBC,  LACTICIDVEN Microbiology No results found for this or any previous visit (from the past 240 hour(s)).   Time coordinating discharge: Over 30 minutes  SIGNED:  Dessa Phi, DO [facility] Pager [phone]  If 7PM-7AM, please contact night-coverage [email or web address] Password [email or web address] 03/20/2016, 10:52 AM

## 2016-03-20 NOTE — Discharge Instructions (Signed)

## 2016-03-20 NOTE — Progress Notes (Signed)
Nsg Discharge Note  Admit Date:  03/19/2016 Discharge date: 03/20/2016   Marie Malone to be D/C'd Home per MD order.  AVS completed.  Copy for chart, and copy for patient signed, and dated. Patient/caregiver able to verbalize understanding.  Discharge Medication:   Medication List    STOP taking these medications   BENADRYL 25 mg capsule Generic drug:  diphenhydrAMINE   potassium chloride 10 MEQ tablet Commonly known as:  K-DUR   warfarin 5 MG tablet Commonly known as:  COUMADIN     TAKE these medications   amLODipine 10 MG tablet Commonly known as:  NORVASC TAKE ONE TABLET BY MOUTH ONCE DAILY   atorvastatin 80 MG tablet Commonly known as:  LIPITOR TAKE ONE TABLET BY MOUTH ONCE DAILY AT 6 PM   docusate sodium 100 MG capsule Commonly known as:  COLACE Take 1 capsule (100 mg total) by mouth 2 (two) times daily.   metoprolol succinate 25 MG 24 hr tablet Commonly known as:  TOPROL-XL Take 1 tablet (25 mg total) by mouth daily.   oxyCODONE-acetaminophen 5-325 MG tablet Commonly known as:  PERCOCET/ROXICET Take 1-2 tablets by mouth every 4 (four) hours as needed for moderate pain.   polyethylene glycol packet Commonly known as:  MIRALAX / GLYCOLAX Take 17 g by mouth daily.   TYLENOL 325 MG tablet Generic drug:  acetaminophen Take 650 mg by mouth every 6 (six) hours as needed for mild pain.       Discharge Assessment: Vitals:   03/20/16 0732 03/20/16 1352  BP: 128/66 (!) 134/53  Pulse: 81 84  Resp:  20  Temp:  98.3 F (36.8 C)   Skin clean, dry and intact without evidence of skin break down, no evidence of skin tears noted. IV catheter discontinued intact. Site without signs and symptoms of complications - no redness or edema noted at insertion site, patient denies c/o pain -Dressing with slight pressure applied.  D/c Instructions-Education: Discharge instructions given to patient/family with verbalized understanding. D/c education completed with  patient/family including follow up instructions, medication list, d/c activities limitations if indicated, with other d/c instructions as indicated by MD - patient able to verbalize understanding, all questions fully answered. Patient instructed to return to ED, call 911, or call MD for any changes in condition.  Patient escorted via [facility], and D/C home via private auto.  Dimas Chyle, RN 03/20/2016 3:23 PM

## 2016-03-20 NOTE — Progress Notes (Signed)
Initial Nutrition Assessment  DOCUMENTATION CODES:   Not applicable  INTERVENTION:   -Ensure Enlive po BID, each supplement provides 350 kcal and 20 grams of protein  NUTRITION DIAGNOSIS:   Inadequate oral intake related to poor appetite as evidenced by meal completion < 50%.  GOAL:   Patient will meet greater than or equal to 90% of their needs  MONITOR:   PO intake, Supplement acceptance, Labs, Weight trends, Skin, I & O's  REASON FOR ASSESSMENT:   Malnutrition Screening Tool    ASSESSMENT:   TIFFINE HENIGAN is a 65 y.o. female with a Past Medical History with past medical history of hypertension, hyperlipidemia, primary hypertension and past CVA, and recent carotid endarterectomy  on 11/1 who presents with AMS. Prolonged QT but exat length hard to determine due to low amplitude of T waves. Will correct electrolytes and recheck. Vasc surgery was paged and pt's surgeon will come to see patient. Wound site does not appear infected and there no signs of extravasation of contrast media on CT angiogram. KUB shows significant constipation, placed on bowel regimen.  Pt admitted with orthostasis.   Spoke with pt and family members at bedside. Pt shares that she has had a poor appetite over the past 3 months, however, has dramatically decreased over the past month. Pt daughter shares that pt still consumes 3 meals per day, but often consumes only bites and sips at meals related to early satiety. Pt consumed about 25% of breakfast tray this morning.   Pt reports UBW is around 170#. Noted a 5.8% wt loss over the past month, which is significant for time frame.   Nutrition-Focused physical exam completed. Findings are no fat depletion, no muscle depletion, and no edema.   Pt family very concerned over decreased oral intake. RD educated pt and family about specific ways to increase oral intake, including eating small, frequent meals throughout the day to minimize intake, examples of high  protein foods, and use of supplements to increase calories and protein in diet. Pt amenable to Ensure supplements.   Case discussed with RN, who confirms potential d/c back to home today.  Labs reviewed: K: 5.3.   Diet Order:  Diet Heart Room service appropriate? Yes; Fluid consistency: Thin Diet - low sodium heart healthy  Skin:  Reviewed, no issues  Last BM:  03/20/16  Height:   Ht Readings from Last 1 Encounters:  03/19/16 5\' 2"  (1.575 m)    Weight:   Wt Readings from Last 1 Encounters:  03/19/16 161 lb 13.1 oz (73.4 kg)    Ideal Body Weight:  50 kg  BMI:  Body mass index is 29.6 kg/m.  Estimated Nutritional Needs:   Kcal:  1650-1850  Protein:  85-100 grams  Fluid:  1.6-1.8 L  EDUCATION NEEDS:   Education needs addressed  Yvonnia Tango A. Jimmye Norman, RD, LDN, CDE Pager: [phone] After hours Pager: [phone]

## 2016-03-20 NOTE — Telephone Encounter (Signed)
Please contact pt to arrange TCM follow up.  

## 2016-03-21 ENCOUNTER — Inpatient Hospital Stay (HOSPITAL_COMMUNITY)
Admission: EM | Admit: 2016-03-21 | Discharge: 2016-04-03 | DRG: 982 | Disposition: A | Discharge status: Home Health | Payer: BC Managed Care – PPO | Attending: Internal Medicine | Admitting: Internal Medicine

## 2016-03-21 ENCOUNTER — Encounter (HOSPITAL_COMMUNITY): Payer: Self-pay | Admitting: Emergency Medicine

## 2016-03-21 ENCOUNTER — Emergency Department (HOSPITAL_COMMUNITY): Payer: BC Managed Care – PPO

## 2016-03-21 DIAGNOSIS — K0530 Chronic periodontitis, unspecified: Secondary | ICD-10-CM | POA: Diagnosis present

## 2016-03-21 DIAGNOSIS — I509 Heart failure, unspecified: Secondary | ICD-10-CM

## 2016-03-21 DIAGNOSIS — I6521 Occlusion and stenosis of right carotid artery: Secondary | ICD-10-CM | POA: Diagnosis present

## 2016-03-21 DIAGNOSIS — R531 Weakness: Secondary | ICD-10-CM

## 2016-03-21 DIAGNOSIS — K029 Dental caries, unspecified: Secondary | ICD-10-CM | POA: Diagnosis present

## 2016-03-21 DIAGNOSIS — K089 Disorder of teeth and supporting structures, unspecified: Secondary | ICD-10-CM

## 2016-03-21 DIAGNOSIS — R55 Syncope and collapse: Secondary | ICD-10-CM | POA: Diagnosis not present

## 2016-03-21 DIAGNOSIS — I441 Atrioventricular block, second degree: Secondary | ICD-10-CM

## 2016-03-21 DIAGNOSIS — K148 Other diseases of tongue: Secondary | ICD-10-CM

## 2016-03-21 DIAGNOSIS — R404 Transient alteration of awareness: Secondary | ICD-10-CM | POA: Diagnosis not present

## 2016-03-21 DIAGNOSIS — K0889 Other specified disorders of teeth and supporting structures: Secondary | ICD-10-CM | POA: Diagnosis present

## 2016-03-21 DIAGNOSIS — K083 Retained dental root: Secondary | ICD-10-CM | POA: Diagnosis present

## 2016-03-21 DIAGNOSIS — I480 Paroxysmal atrial fibrillation: Secondary | ICD-10-CM | POA: Diagnosis present

## 2016-03-21 DIAGNOSIS — K045 Chronic apical periodontitis: Secondary | ICD-10-CM

## 2016-03-21 DIAGNOSIS — R4182 Altered mental status, unspecified: Secondary | ICD-10-CM | POA: Diagnosis present

## 2016-03-21 DIAGNOSIS — I10 Essential (primary) hypertension: Secondary | ICD-10-CM | POA: Diagnosis present

## 2016-03-21 DIAGNOSIS — K5900 Constipation, unspecified: Secondary | ICD-10-CM | POA: Diagnosis present

## 2016-03-21 DIAGNOSIS — I050 Rheumatic mitral stenosis: Secondary | ICD-10-CM | POA: Diagnosis present

## 2016-03-21 DIAGNOSIS — M278 Other specified diseases of jaws: Secondary | ICD-10-CM | POA: Diagnosis present

## 2016-03-21 DIAGNOSIS — I951 Orthostatic hypotension: Secondary | ICD-10-CM | POA: Diagnosis not present

## 2016-03-21 DIAGNOSIS — I639 Cerebral infarction, unspecified: Secondary | ICD-10-CM

## 2016-03-21 HISTORY — DX: Paroxysmal atrial fibrillation: I48.0

## 2016-03-21 LAB — PROTIME-INR
INR: 2.26
PROTHROMBIN TIME: 25.3 s — AB (ref 11.4–15.2)

## 2016-03-21 LAB — BASIC METABOLIC PANEL
ANION GAP: 11 (ref 5–15)
BUN: 16 mg/dL (ref 6–20)
CHLORIDE: 104 mmol/L (ref 101–111)
CO2: 22 mmol/L (ref 22–32)
Calcium: 9.7 mg/dL (ref 8.9–10.3)
Creatinine, Ser: 0.82 mg/dL (ref 0.44–1.00)
GFR calc Af Amer: >60 mL/min (ref >60)
GLUCOSE: 101 mg/dL — AB (ref 65–99)
POTASSIUM: 3.9 mmol/L (ref 3.5–5.1)
Sodium: 137 mmol/L (ref 135–145)

## 2016-03-21 LAB — CBC
HEMATOCRIT: 36.9 % (ref 36.0–46.0)
HEMOGLOBIN: 11.5 g/dL — AB (ref 12.0–15.0)
MCH: 26.3 pg (ref 26.0–34.0)
MCHC: 31.2 g/dL (ref 30.0–36.0)
MCV: 84.4 fL (ref 78.0–100.0)
Platelets: 237 10*3/uL (ref 150–400)
RBC: 4.37 MIL/uL (ref 3.87–5.11)
RDW: 15.5 % (ref 11.5–15.5)
WBC: 6.5 10*3/uL (ref 4.0–10.5)

## 2016-03-21 LAB — URINALYSIS, ROUTINE W REFLEX MICROSCOPIC
BILIRUBIN URINE: NEGATIVE
GLUCOSE, UA: NEGATIVE mg/dL
Hgb urine dipstick: NEGATIVE
Ketones, ur: NEGATIVE mg/dL
LEUKOCYTES UA: NEGATIVE
NITRITE: NEGATIVE
PH: 7 (ref 5.0–8.0)
Protein, ur: NEGATIVE mg/dL
SPECIFIC GRAVITY, URINE: 1.01 (ref 1.005–1.030)

## 2016-03-21 LAB — CBG MONITORING, ED: Glucose-Capillary: 89 mg/dL (ref 65–99)

## 2016-03-21 MED ORDER — METOPROLOL SUCCINATE ER 25 MG PO TB24: 25.0000 mg | ORAL_TABLET | Freq: Every day | ORAL | Status: DC

## 2016-03-21 MED ORDER — WARFARIN - PHARMACIST DOSING INPATIENT
Freq: Every day | Status: DC
  Administered 2016-03-21 – 2016-03-22 (×2)

## 2016-03-21 MED ORDER — WARFARIN SODIUM 5 MG PO TABS
5.0000 mg | ORAL_TABLET | Freq: Once | ORAL | Status: AC
  Administered 2016-03-21: 5 mg via ORAL
  Filled 2016-03-21: qty 1

## 2016-03-21 MED ORDER — AMLODIPINE BESYLATE 10 MG PO TABS
10.0000 mg | ORAL_TABLET | Freq: Every day | ORAL | Status: DC
  Administered 2016-03-21: 10 mg via ORAL
  Filled 2016-03-21: qty 1

## 2016-03-21 MED ORDER — POLYETHYLENE GLYCOL 3350 17 G PO PACK
17.0000 g | PACK | Freq: Every day | ORAL | Status: DC
  Administered 2016-03-22 – 2016-04-03 (×7): 17 g via ORAL
  Filled 2016-03-21 (×9): qty 1

## 2016-03-21 MED ORDER — ATORVASTATIN CALCIUM 80 MG PO TABS
80.0000 mg | ORAL_TABLET | Freq: Every day | ORAL | Status: DC
  Administered 2016-03-21 – 2016-04-02 (×12): 80 mg via ORAL
  Filled 2016-03-21 (×12): qty 1

## 2016-03-21 MED ORDER — SODIUM CHLORIDE 0.9 % IV BOLUS (SEPSIS)
1000.0000 mL | Freq: Once | INTRAVENOUS | Status: AC
Start: 2016-03-21 — End: 2016-03-21
  Administered 2016-03-21: 1000 mL via INTRAVENOUS

## 2016-03-21 MED ORDER — DOCUSATE SODIUM 100 MG PO CAPS
100.0000 mg | ORAL_CAPSULE | Freq: Two times a day (BID) | ORAL | Status: DC
  Administered 2016-03-21 – 2016-04-03 (×15): 100 mg via ORAL
  Filled 2016-03-21 (×17): qty 1

## 2016-03-21 NOTE — H&P (Signed)
History and Physical    Marie Malone OZD:[id number] DOB: [birth year] DOA: 03/21/2016  PCP: [name], NP  Patient coming from: Home  Chief Complaint: altered mentation   HPI: Marie Malone a 65 y.o.femalewith medical history significant for hypertension, dyslipidemia, known mitral stenosis and moderate to severe pulmonary hypertension, history of prior CVA, recent right carotid endarterectomy on 03/10/16, Atrialfibrillation on warfarin, prior aortic valvulotomy in 2008. She was admitted to the hospital from 11/10-11/11 after a "staring" spell. Work up was non-revealing and her symptoms had completely resolved. She was discharged home did well throughout the day, but had 2 more episodes last night. She states that she was getting a cup of water when she "felt off." The next thing she knew, she was on the floor. She believes she passed out. Her husband witnessed the event and stated that she did not lose consciousness, but was unresponsive for 5 minutes. No observed twitching, loss of bladder/bowel. This happened a total of 2 times last night. She was slightly confused after episodes but otherwise back to baseline now. She has no other complaints of pain, chest pain, SOB, N/V/D, abdominal pain.  ED Course: IVF given   Review of Systems: As per HPI otherwise 10 point review of systems negative.   Past Medical History:  Diagnosis Date  . Atrial fibrillation ([place])   . Complication of anesthesia    difficult to wake up from anesthesia  . Heart murmur   . History of rheumatic fever    as a child  . Hyperlipidemia   . Hypertension   . Mitral stenosis   . OSA (obstructive sleep apnea)    mild per sleep study 2008  . Pneumonia    double pneumonia once  . Stroke [facility])     Past Surgical History:  Procedure Laterality Date  . APPENDECTOMY  1989   appendix ruptured,had peritonitis  . CARDIAC SURGERY  2008   "balloon surgery at [facility]"  . COLONOSCOPY W/ POLYPECTOMY    .  ENDARTERECTOMY Right 03/10/2016   Procedure: ENDARTERECTOMY CAROTID RIGHT;  Surgeon: Serafina Mitchell, MD;  Location: [place];  Service: Vascular;  Laterality: Right;  . EP IMPLANTABLE DEVICE N/A 03/11/2016   Procedure: Loop Recorder Removal;  Surgeon: Deboraha Sprang, MD;  Location: [place] CV LAB;  Service: Cardiovascular;  Laterality: N/A;  . 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 LOOP RECORDER IMPLANT N/A 04/10/2013   Procedure: LOOP RECORDER IMPLANT;  Surgeon: Coralyn Mark, MD;  Location: [place] CATH LAB;  Service: Cardiovascular;  Laterality: N/A;  . PATCH ANGIOPLASTY Right 03/10/2016   Procedure: PATCH ANGIOPLASTY CAROTID RIGHT USING Rueben Bash BIOLOGIC PATCH;  Surgeon: Serafina Mitchell, MD;  Location: [place];  Service: Vascular;  Laterality: Right;  . TEE WITHOUT CARDIOVERSION N/A 04/10/2013   Procedure: TRANSESOPHAGEAL ECHOCARDIOGRAM (TEE);  Surgeon: [name], MD;  Location: [facility] ENDOSCOPY;  Service: Cardiovascular;  Laterality: N/A;  . TUBAL LIGATION       reports that she quit smoking about 40 years ago. Her smoking use included Cigarettes. She started smoking about 45 years ago. She has a 5.00 pack-year smoking history. She has never used smokeless tobacco. She reports that she does not drink alcohol or use drugs.  Allergies  Allergen Reactions  . No Known Allergies     Family History  Problem Relation Age of Onset  . Diabetes Mother   . Stroke Mother   .  Diabetes Father   . Heart disease Father     CHF  . Cancer Father     kidney  . Diabetes Sister   . Diabetes Brother   . Hypertension Daughter     Prior to Admission medications   Medication Sig Start Date End Date Taking? Authorizing Provider  acetaminophen (TYLENOL) 325 MG tablet Take 650 mg by mouth every 6 (six) hours as needed for mild pain.     Historical Provider, MD  amLODipine (NORVASC) 10 MG tablet TAKE ONE TABLET BY MOUTH ONCE DAILY 02/04/16   Debbrah Alar, NP  atorvastatin (LIPITOR) 80 MG tablet TAKE ONE TABLET BY MOUTH ONCE DAILY AT 6 PM 03/15/16   Debbrah Alar, NP  docusate sodium (COLACE) 100 MG capsule Take 1 capsule (100 mg total) by mouth 2 (two) times daily. 03/20/16   Reshunda Strider Chahn-Yang Keondre Markson, DO  metoprolol succinate (TOPROL-XL) 25 MG 24 hr tablet Take 1 tablet (25 mg total) by mouth daily. 03/15/16   Debbrah Alar, NP  oxyCODONE-acetaminophen (PERCOCET/ROXICET) 5-325 MG tablet Take 1-2 tablets by mouth every 4 (four) hours as needed for moderate pain. 03/11/16   Alvia Grove, PA-C  polyethylene glycol (MIRALAX / GLYCOLAX) packet Take 17 g by mouth daily. 03/20/16   Shon Millet, DO    Physical Exam: Vitals:   03/21/16 1030 03/21/16 1100 03/21/16 1145 03/21/16 1230  BP: 146/69 138/71 138/71 137/76  Pulse: 86 83 77 81  Resp: 16 16 13 13   Temp:      TempSrc:      SpO2: 100% 100% 99% 100%    Constitutional: NAD, calm, comfortable Eyes: PERRL, lids and conjunctivae normal ENMT: Mucous membranes are moist. Posterior pharynx clear of any exudate or lesions.Normal dentition. Right neck with hematoma after CEA  Neck: normal, supple, no masses, no thyromegaly Respiratory: clear to auscultation bilaterally, no wheezing, no crackles. Normal respiratory effort. No accessory muscle use.  Cardiovascular: Regular rate and rhythm, no murmurs / rubs / gallops. No extremity edema. 2+ pedal pulses.  Abdomen: no tenderness, no masses palpated. No hepatosplenomegaly. Bowel sounds positive.  Musculoskeletal: no clubbing / cyanosis. No joint deformity upper and lower extremities. Good ROM, no contractures. Normal muscle tone.  Skin: no rashes, lesions, ulcers. No induration Neurologic: CN 2-12 grossly intact. Strength 5/5 in all 4.  Psychiatric: Normal judgment and insight. Alert and oriented x 3. Normal mood.   Labs on Admission: I have personally reviewed following labs and imaging studies  CBC:  Recent  Labs Lab 03/19/16 0318 03/21/16 0641  WBC 5.6 6.5  NEUTROABS 3.4  --   HGB 11.9* 11.5*  HCT 37.6 36.9  MCV 84.3 84.4  PLT 183 627   Basic Metabolic Panel:  Recent Labs Lab 03/19/16 0318 03/19/16 0420 03/19/16 1028 03/20/16 0445 03/21/16 0641  NA 138  --   --  139 137  K 3.1*  --   --  5.3* 3.9  CL 103  --   --  107 104  CO2 22  --   --  24 22  GLUCOSE 143*  --   --  93 101*  BUN 12  --   --  13 16  CREATININE 0.95  --   --  0.75 0.82  CALCIUM 9.7  --   --  9.6 9.7  MG  --  2.0  --   --   --   PHOS  --   --  3.7  --   --  GFR: Estimated Creatinine Clearance: 65 mL/min (by C-G formula based on SCr of 0.82 mg/dL). Liver Function Tests:  Recent Labs Lab 03/19/16 0318  AST 28  ALT 24  ALKPHOS 75  BILITOT 1.2  PROT 8.5*  ALBUMIN 3.7   No results for input(s): LIPASE, AMYLASE in the last 168 hours. No results for input(s): AMMONIA in the last 168 hours. Coagulation Profile:  Recent Labs Lab 03/16/16 1228 03/19/16 0318 03/20/16 0445 03/21/16 0641  INR 3.3 3.92 3.93 2.26   Cardiac Enzymes: No results for input(s): CKTOTAL, CKMB, CKMBINDEX, TROPONINI in the last 168 hours. BNP (last 3 results)  Recent Labs  10/10/15 1343 01/20/16 1539  PROBNP 238.0* 136.0*   HbA1C: No results for input(s): HGBA1C in the last 72 hours. CBG:  Recent Labs Lab 03/21/16 0707  GLUCAP 89   Lipid Profile: No results for input(s): CHOL, HDL, LDLCALC, TRIG, CHOLHDL, LDLDIRECT in the last 72 hours. Thyroid Function Tests: No results for input(s): TSH, T4TOTAL, FREET4, T3FREE, THYROIDAB in the last 72 hours. Anemia Panel: No results for input(s): VITAMINB12, FOLATE, FERRITIN, TIBC, IRON, RETICCTPCT in the last 72 hours. Urine analysis:    Component Value Date/Time   COLORURINE YELLOW 03/21/2016 0830   APPEARANCEUR CLEAR 03/21/2016 0830   LABSPEC 1.010 03/21/2016 0830   PHURINE 7.0 03/21/2016 0830   GLUCOSEU NEGATIVE 03/21/2016 0830   GLUCOSEU NEGATIVE 07/22/2015  1444   HGBUR NEGATIVE 03/21/2016 0830   BILIRUBINUR NEGATIVE 03/21/2016 0830   KETONESUR NEGATIVE 03/21/2016 0830   PROTEINUR NEGATIVE 03/21/2016 0830   UROBILINOGEN 0.2 07/22/2015 1444   NITRITE NEGATIVE 03/21/2016 0830   LEUKOCYTESUR NEGATIVE 03/21/2016 0830   Sepsis Labs: !!!!!!!!!!!!!!!!!!!!!!!!!!!!!!!!!!!!!!!!!!!! @LABRCNTIP (procalcitonin:4,lacticidven:4) )No results found for this or any previous visit (from the past 240 hour(s)).   Radiological Exams on Admission: Ct Head Wo Contrast  Result Date: 03/21/2016 CLINICAL DATA:  Frontal and right-sided headache after dizziness and fall. Right-sided neck pain. Carotid endarterectomy March 10, 2016 EXAM: CT HEAD WITHOUT CONTRAST CT CERVICAL SPINE WITHOUT CONTRAST TECHNIQUE: Multidetector CT imaging of the head and cervical spine was performed following the standard protocol without intravenous contrast. Multiplanar CT image reconstructions of the cervical spine were also generated. COMPARISON:  March 19, 2016 CT scan FINDINGS: CT HEAD FINDINGS Brain: No subdural, epidural, or subarachnoid hemorrhage. Small infarct in the left cerebellar hemisphere, unchanged. Left parietal infarct with encephalomalacia, unchanged. No acute cortical ischemia or infarct. Ventricles and sulci are stable. Brainstem and basal cisterns are normal. No mass, mass effect, or midline shift. Vascular: Calcified atherosclerosis in the intracranial portions of the carotid arteries. Skull: Normal. Negative for fracture or focal lesion. Sinuses/Orbits: No acute finding. Other: None. CT CERVICAL SPINE FINDINGS Alignment: Straightening of normal lordosis with no traumatic malalignment. Skull base and vertebrae: No fractures. Soft tissues and spinal canal: Hematoma in the right side of the neck from recent right carotid endarterectomy exerts mass effect on the right thyroid lobe. The CTA of the neck from March 19, 2016 demonstrated mild narrowing of the carotid artery as it  passed posterior to the hematoma. The carotid artery is not well assessed in this region today. The hematoma is similar in size today. No other acute soft tissue abnormalities. Disc levels: Multilevel degenerative changes with anterior and posterior osteophytes. Partial calcification of the posterior longitudinal ligament. The posterior osteophytes are most prominent at T1-2 and T2-3 with narrowing of the spinal canal at these levels. At T1-2, the spinal canal measures 7 mm in maximum diameter. Upper chest: Negative. Other: No  other abnormalities. IMPRESSION: 1. No acute intracranial process. Left cerebellar and left parietal infarcts, chronic. 2. No fracture or traumatic malalignment in the cervical spine. 3. Degenerative changes. Partial calcification of the posterior longitudinal ligament. Narrowing of the spinal canal due to these findings, most marked at T1-2 and T2-3 with an AP diameter of the spinal canal at T1-2 of 7 mm. 4. Hematoma in the right side of the neck from recent surgery. Electronically Signed   By: Dorise Bullion III M.D   On: 03/21/2016 09:50   Ct Cervical Spine Wo Contrast  Result Date: 03/21/2016 CLINICAL DATA:  Frontal and right-sided headache after dizziness and fall. Right-sided neck pain. Carotid endarterectomy March 10, 2016 EXAM: CT HEAD WITHOUT CONTRAST CT CERVICAL SPINE WITHOUT CONTRAST TECHNIQUE: Multidetector CT imaging of the head and cervical spine was performed following the standard protocol without intravenous contrast. Multiplanar CT image reconstructions of the cervical spine were also generated. COMPARISON:  March 19, 2016 CT scan FINDINGS: CT HEAD FINDINGS Brain: No subdural, epidural, or subarachnoid hemorrhage. Small infarct in the left cerebellar hemisphere, unchanged. Left parietal infarct with encephalomalacia, unchanged. No acute cortical ischemia or infarct. Ventricles and sulci are stable. Brainstem and basal cisterns are normal. No mass, mass effect, or  midline shift. Vascular: Calcified atherosclerosis in the intracranial portions of the carotid arteries. Skull: Normal. Negative for fracture or focal lesion. Sinuses/Orbits: No acute finding. Other: None. CT CERVICAL SPINE FINDINGS Alignment: Straightening of normal lordosis with no traumatic malalignment. Skull base and vertebrae: No fractures. Soft tissues and spinal canal: Hematoma in the right side of the neck from recent right carotid endarterectomy exerts mass effect on the right thyroid lobe. The CTA of the neck from March 19, 2016 demonstrated mild narrowing of the carotid artery as it passed posterior to the hematoma. The carotid artery is not well assessed in this region today. The hematoma is similar in size today. No other acute soft tissue abnormalities. Disc levels: Multilevel degenerative changes with anterior and posterior osteophytes. Partial calcification of the posterior longitudinal ligament. The posterior osteophytes are most prominent at T1-2 and T2-3 with narrowing of the spinal canal at these levels. At T1-2, the spinal canal measures 7 mm in maximum diameter. Upper chest: Negative. Other: No other abnormalities. IMPRESSION: 1. No acute intracranial process. Left cerebellar and left parietal infarcts, chronic. 2. No fracture or traumatic malalignment in the cervical spine. 3. Degenerative changes. Partial calcification of the posterior longitudinal ligament. Narrowing of the spinal canal due to these findings, most marked at T1-2 and T2-3 with an AP diameter of the spinal canal at T1-2 of 7 mm. 4. Hematoma in the right side of the neck from recent surgery. Electronically Signed   By: Dorise Bullion III M.D   On: 03/21/2016 09:50    EKG: Independently reviewed. First degree heart block. Sinus   Assessment/Plan Principal Problem:   Altered mental state Active Problems:   HTN (hypertension)   Atrial fibrillation ([facility])   HLD (hyperlipidemia)   Asymptomatic stenosis of right  carotid artery s/p right CEA 03/10/16   Constipation   Altered mentation -Previous admission thought to be orthostatic etiology and had resolved and discharged home. Now re-admitted with 2 additional episodes at home. Husband denies actual loss of consciousness. Patient was apparently awake but not responsive to voice.  -Consulted neurology for possible seizure etiology   Paroxysmal atrial fibrillation on warfarin -Patient currently in sinus rhythm with first-degree AV block -INR therapeutic. Pharmacy consulted for warfarin dosing  HTN -Controlled -Continue Norvasc, metoprolol   History of CVA (cerebrovascular accident)/Asymptomatic stenosis of right carotid artery s/p right CEA 03/10/16 -CTA  Head and neck on 11/10 with patent right carotid endarterecomy site  -Vascular sx evaluated patient on 11/10-11/11: Operative site with hematoma, monitor site. Follow up with vascular surgery outpatient.  -Monitor hematoma site  Constipation -Colace 100 mg twice a day -MiraLAX 17 g daily  Mitral stenosis/Mildpulmonary hypertension -Echocardiogram in August 2017 showed normal ejection fraction with severe diffuse thickening and calcification of aortic valve, prior mitral valvuloplasty with severely thickened mitral valve leaflets and associated moderate mitral stenosis-which has progressed since 2016, and pulmonary hypertension was characterized as mild with associated increase in right ventricular systolic pressure  -Repeat echo to rule out cardiac etiology   HLD  -Continue Lipitor   DVT prophylaxis: coumadin  Code Status: Full   Family Communication: husband and daughter at bedside Disposition Plan: pending further evaluation Consults called: Neurology - Dr. Leonel Ramsay  Admission status: Observation  It is my clinical opinion that referral for OBSERVATION is reasonable and necessary in this 65 y.o. year old female  presenting with symptoms of altered mentation, concerning for  seizure or cardiac etiology  in the context of PMH including A Fib, Mitral stenosis and aortic valve thickening, hx CVA and asymptomatic stenosis of right carotid s/p CEA, HTN, HLD   recurrent symptoms at home after discharge in past 24 hours   The aforementioned taken together are felt to place the patient at high risk for further  clinical deterioration. However it is anticipated that the patient may be medically stable for discharge from the hospital within 24 to 48 hours.  Dessa Phi, DO [facility] [email or web address] Password [facility] 03/21/2016, 12:59 PM

## 2016-03-21 NOTE — ED Provider Notes (Signed)
[facility] DEPT Provider Note   CSN: [id number] Arrival date & time: 03/21/16  0554     History   Chief Complaint Chief Complaint  Patient presents with  . Dizziness    orthostatic hypotension    HPI Marie Malone is a 65 y.o. female who presents with syncope. PMH significant for right carotid endarterectomy on 11/1, HTN, HLD, moderate mitral stenosis, hx of mitral valvuloplasty, pulmonary HTN, hx of CVA, A.fib on anticoagulation. Currently she has a headache which is 7/10. She was discharged from the hospital yesterday morning. She states was doing fine all day. Ate and drank. Last night she got up to go to the bathroom. She made it back to the bathroom and "felt funny" and passed out. She is unsure if she hit her head of neck. Her husband helped her back to bed and told her to tell him if she needs to go to the bathroom again. She had to go again and when she stood up to go she passed out again. When she regained consciousness she was diaphoretic, weak, and was not responding to questions although was alert. EMS was called and she was layed down and given fluids with improvement in mental status. Patient denies fever, chills, chest pain, SOB, abdominal pain, N/V. She reports a mild headache and neck pain currently. Family denies that hematoma on right side of neck has gotten worse.  HPI  Past Medical History:  Diagnosis Date  . Atrial fibrillation ([place])   . Complication of anesthesia    difficult to wake up from anesthesia  . Heart murmur   . History of rheumatic fever    as a child  . Hyperlipidemia   . Hypertension   . Mitral stenosis   . OSA (obstructive sleep apnea)    mild per sleep study 2008  . Pneumonia    double pneumonia once  . Stroke [facility])     Patient Active Problem List   Diagnosis Date Noted  . Orthostasis 03/19/2016  . Constipation 03/19/2016  . Prolonged Q-T interval on ECG 03/19/2016  . Asymptomatic stenosis of right carotid artery s/p right CEA  03/10/16 03/10/2016  . Carotid stenosis 04/09/2014  . Cerebral infarction due to embolism of left middle cerebral artery ([place]) 04/09/2014  . HLD (hyperlipidemia) 04/09/2014  . Valvular vegetation 04/09/2014  . Urinary urgency 12/03/2013  . Hypokalemia 07/24/2013  . Encounter for therapeutic drug monitoring 06/04/2013  . Atrial fibrillation ([place]) 04/19/2013  . Cardiac device in situ 04/19/2013  . Endocarditis 04/13/2013  . History of CVA (cerebrovascular accident) 04/12/2013  . S/P aortic valvotomy 2008 04/06/2013  . Mild pulmonary hypertension 04/06/2013  . Mitral stenosis 10/25/2012  . Routine general medical examination at a health care facility 09/22/2012  . HTN (hypertension) 08/30/2012  . History of rheumatic fever 08/30/2012    Past Surgical History:  Procedure Laterality Date  . APPENDECTOMY  1989   appendix ruptured,had peritonitis  . CARDIAC SURGERY  2008   "balloon surgery at [facility]"  . COLONOSCOPY W/ POLYPECTOMY    . ENDARTERECTOMY Right 03/10/2016   Procedure: ENDARTERECTOMY CAROTID RIGHT;  Surgeon: Serafina Mitchell, MD;  Location: [facility];  Service: Vascular;  Laterality: Right;  . EP IMPLANTABLE DEVICE N/A 03/11/2016   Procedure: Loop Recorder Removal;  Surgeon: Deboraha Sprang, MD;  Location: [facility] CV LAB;  Service: Cardiovascular;  Laterality: N/A;  . 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 LOOP  RECORDER IMPLANT N/A 04/10/2013   Procedure: LOOP RECORDER IMPLANT;  Surgeon: Coralyn Mark, MD;  Location: [facility] CATH LAB;  Service: Cardiovascular;  Laterality: N/A;  . PATCH ANGIOPLASTY Right 03/10/2016   Procedure: PATCH ANGIOPLASTY CAROTID RIGHT USING Rueben Bash BIOLOGIC PATCH;  Surgeon: Serafina Mitchell, MD;  Location: [place];  Service: Vascular;  Laterality: Right;  . TEE WITHOUT CARDIOVERSION N/A 04/10/2013   Procedure: TRANSESOPHAGEAL ECHOCARDIOGRAM (TEE);  Surgeon: [name], MD;  Location: [facility] ENDOSCOPY;  Service:  Cardiovascular;  Laterality: N/A;  . TUBAL LIGATION      OB History    No data available       Home Medications    Prior to Admission medications   Medication Sig Start Date End Date Taking? Authorizing Provider  acetaminophen (TYLENOL) 325 MG tablet Take 650 mg by mouth every 6 (six) hours as needed for mild pain.     Historical Provider, MD  amLODipine (NORVASC) 10 MG tablet TAKE ONE TABLET BY MOUTH ONCE DAILY 02/04/16   Debbrah Alar, NP  atorvastatin (LIPITOR) 80 MG tablet TAKE ONE TABLET BY MOUTH ONCE DAILY AT 6 PM 03/15/16   Debbrah Alar, NP  docusate sodium (COLACE) 100 MG capsule Take 1 capsule (100 mg total) by mouth 2 (two) times daily. 03/20/16   Jennifer Chahn-Yang Choi, DO  metoprolol succinate (TOPROL-XL) 25 MG 24 hr tablet Take 1 tablet (25 mg total) by mouth daily. 03/15/16   Debbrah Alar, NP  oxyCODONE-acetaminophen (PERCOCET/ROXICET) 5-325 MG tablet Take 1-2 tablets by mouth every 4 (four) hours as needed for moderate pain. 03/11/16   Alvia Grove, PA-C  polyethylene glycol (MIRALAX / GLYCOLAX) packet Take 17 g by mouth daily. 03/20/16   Shon Millet, DO    Family History Family History  Problem Relation Age of Onset  . Diabetes Mother   . Stroke Mother   . Diabetes Father   . Heart disease Father     CHF  . Cancer Father     kidney  . Diabetes Sister   . Diabetes Brother   . Hypertension Daughter     Social History Social History  Substance Use Topics  . Smoking status: Former Smoker    Packs/day: 1.00    Years: 5.00    Types: Cigarettes    Start date: 07/15/1970    Quit date: 05/11/1975  . Smokeless tobacco: Never Used     Comment: quit smoking 36 years ago  . Alcohol use No     Comment: drank years ago when young     Allergies   No known allergies   Review of Systems Review of Systems  Constitutional: Positive for diaphoresis. Negative for chills and fever.  Eyes: Negative for visual disturbance.  Respiratory:  Negative for shortness of breath.   Cardiovascular: Negative for chest pain.  Gastrointestinal: Negative for abdominal pain, nausea and vomiting.  Neurological: Positive for syncope, weakness and headaches. Negative for dizziness and light-headedness.     Physical Exam Updated Vital Signs BP 132/67 (BP Location: Right Arm)   Pulse 79   Temp 97.9 F (36.6 C) (Oral)   Resp 14   SpO2 99%   Physical Exam  Constitutional: She is oriented to person, place, and time. She appears well-developed and well-nourished. No distress.  HENT:  Head: Normocephalic and atraumatic.  Eyes: Conjunctivae are normal. Pupils are equal, round, and reactive to light. Right eye exhibits no discharge. Left eye exhibits no discharge. No scleral icterus.  Neck: Normal range of  motion.  Midline tenderness. Scar from endarterectomy has apparent hematoma with linear scar which appears to be healing well. No warmth or drainage. It is nontender  Cardiovascular: Normal rate and regular rhythm.  Exam reveals no gallop and no friction rub.   No murmur heard. Pulmonary/Chest: Effort normal and breath sounds normal. No respiratory distress. She has no wheezes. She has no rales. She exhibits no tenderness.  Abdominal: Soft. Bowel sounds are normal. She exhibits no distension and no mass. There is no tenderness. There is no rebound and no guarding. No hernia.  Neurological: She is alert and oriented to person, place, and time.  She is lying on stretcher with eyes closed but opens eyes to voice, gives a fluent history, and follow commands. No aphasia. Cranial nerves grossly intact. Strength 5/5 in upper and lower extremities bilaterally. Sensation grossly intact. Bilateral finger-to-nose intact.    Skin: Skin is warm and dry.  Psychiatric: She has a normal mood and affect. Her behavior is normal.  Nursing note and vitals reviewed.    ED Treatments / Results  Labs (all labs ordered are listed, but only abnormal results are  displayed) Labs Reviewed  BASIC METABOLIC PANEL - Abnormal; Notable for the following:       Result Value   Glucose, Bld 101 (*)    All other components within normal limits  CBC - Abnormal; Notable for the following:    Hemoglobin 11.5 (*)    All other components within normal limits  PROTIME-INR - Abnormal; Notable for the following:    Prothrombin Time 25.3 (*)    All other components within normal limits  URINALYSIS, ROUTINE W REFLEX MICROSCOPIC (NOT AT [facility])  CBG MONITORING, ED    EKG  EKG Interpretation None       Radiology Dg Abd 1 View  Result Date: 03/19/2016 CLINICAL DATA:  Constipation. EXAM: ABDOMEN - 1 VIEW COMPARISON:  No recent prior. FINDINGS: Soft tissue structures are unremarkable. Ill-defined radiopacity is noted stomach, this may represent debris within the stomach. Stool noted throughout colon suggests constipation. No bowel distention. No free air. Degenerative changes lumbar spine and both hips. Aortoiliac atherosclerotic vascular calcification. Surgical clips sutures in the pelvis. IMPRESSION: No acute abnormality identified. Prominent amount of stool in the colon suggesting constipation. Electronically Signed   By: Marcello Moores  Register   On: 03/19/2016 09:56    Procedures Procedures (including critical care time)  Medications Ordered in ED Medications  sodium chloride 0.9 % bolus 1,000 mL (1,000 mLs Intravenous New Bag/Given 03/21/16 0930)     Initial Impression / Assessment and Plan / ED Course  I have reviewed the triage vital signs and the nursing notes.  Pertinent labs & imaging results that were available during my care of the patient were reviewed by me and considered in my medical decision making (see chart for details).  Clinical Course as of Mar 21 1638  Nancy Fetter Mar 21, 2016  1638 ED EKG [KG]    Clinical Course User Index [KG] Recardo Evangelist, PA-C   65 year old female with syncope. Her BP has dropped with orthostatic vitals but she is  not hypotensive. All other vitals are WNL and stable. Labs are unremarkable. INR is therapeutic. CT head and neck negative. EKG is SR with 1st degree block. Shared visit with Dr. Rogene Houston. Due to return of symptoms with new syncope will ask hospitalist to admit.   Final Clinical Impressions(s) / ED Diagnoses   Final diagnoses:  Syncope, unspecified syncope type  New Prescriptions New Prescriptions   No medications on file     Recardo Evangelist, PA-C 03/21/16 [place], MD 03/22/16 1700

## 2016-03-21 NOTE — Progress Notes (Signed)
ANTICOAGULATION CONSULT NOTE - Initial Consult  Pharmacy Consult for warfarin Indication: atrial fibrillation  Vital Signs: Temp: 97.9 F (36.6 C) (11/12 0605) Temp Source: Oral (11/12 0605) BP: 131/70 (11/12 1330) Pulse Rate: 78 (11/12 1330)  Labs:  Recent Labs  03/19/16 0318 03/20/16 0445 03/21/16 0641  HGB 11.9*  --  11.5*  HCT 37.6  --  36.9  PLT 183  --  237  APTT 40*  --   --   LABPROT 39.4* 39.5* 25.3*  INR 3.92 3.93 2.26  CREATININE 0.95 0.75 0.82    Estimated Creatinine Clearance: 65 mL/min (by C-G formula based on SCr of 0.82 mg/dL).   Medical History: Past Medical History:  Diagnosis Date  . Atrial fibrillation ([place])   . Complication of anesthesia    difficult to wake up from anesthesia  . Heart murmur   . History of rheumatic fever    as a child  . Hyperlipidemia   . Hypertension   . Mitral stenosis   . OSA (obstructive sleep apnea)    mild per sleep study 2008  . Pneumonia    double pneumonia once  . Stroke [facility])    Assessment: 65 yo F re-admitted on 11/12 with AMS (admitted 11/10-11/11 also for AMS). PTA warfarin for history of afib. Warfarin was held during last admission for supratherapeutic INR, but today INR is therapeutic at 2.26. Hgb 11.5 stable, Plt wnl, no bleeding documented.  PTA dose: 5 mg daily except 7.5 mg on Wednesdays  Goal of Therapy:  INR 2-3 Monitor platelets by anticoagulation protocol: Yes   Plan:  - Warfarin 5 mg tonight - Daily INR - Monitor CBC, s/sx of bleeding   Gwenlyn Perking, PharmD PGY1 Pharmacy Resident Pager: [phone] 03/21/2016 2:26 PM

## 2016-03-21 NOTE — ED Triage Notes (Signed)
Pt arrives via EMS reporting dizziness after using the bathroom, seen yesterday morning for similar s/s. EMS reports pale and diaphoretic, no radial pulses upon their arrival. Pt recovered upon laying down. Turned around with 150 CC NS and repositioning in [place] . Alert x4 upon arrival to room.

## 2016-03-21 NOTE — ED Provider Notes (Signed)
Medical screening examination/treatment/procedure(s) were conducted as a shared visit with non-physician practitioner(s) and myself.  I personally evaluated the patient during the encounter.   EKG Interpretation None      Results for orders placed or performed during the hospital encounter of 82/99/37  Basic metabolic panel  Result Value Ref Range   Sodium 137 135 - 145 mmol/L   Potassium 3.9 3.5 - 5.1 mmol/L   Chloride 104 101 - 111 mmol/L   CO2 22 22 - 32 mmol/L   Glucose, Bld 101 (H) 65 - 99 mg/dL   BUN 16 6 - 20 mg/dL   Creatinine, Ser 0.82 0.44 - 1.00 mg/dL   Calcium 9.7 8.9 - 10.3 mg/dL   GFR calc non Af Amer >60 >60 mL/min   GFR calc Af Amer >60 >60 mL/min   Anion gap 11 5 - 15  CBC  Result Value Ref Range   WBC 6.5 4.0 - 10.5 K/uL   RBC 4.37 3.87 - 5.11 MIL/uL   Hemoglobin 11.5 (L) 12.0 - 15.0 g/dL   HCT 36.9 36.0 - 46.0 %   MCV 84.4 78.0 - 100.0 fL   MCH 26.3 26.0 - 34.0 pg   MCHC 31.2 30.0 - 36.0 g/dL   RDW 15.5 11.5 - 15.5 %   Platelets 237 150 - 400 K/uL  Urinalysis, Routine w reflex microscopic  Result Value Ref Range   Color, Urine YELLOW YELLOW   APPearance CLEAR CLEAR   Specific Gravity, Urine 1.010 1.005 - 1.030   pH 7.0 5.0 - 8.0   Glucose, UA NEGATIVE NEGATIVE mg/dL   Hgb urine dipstick NEGATIVE NEGATIVE   Bilirubin Urine NEGATIVE NEGATIVE   Ketones, ur NEGATIVE NEGATIVE mg/dL   Protein, ur NEGATIVE NEGATIVE mg/dL   Nitrite NEGATIVE NEGATIVE   Leukocytes, UA NEGATIVE NEGATIVE  Protime-INR  Result Value Ref Range   Prothrombin Time 25.3 (H) 11.4 - 15.2 seconds   INR 2.26   CBG monitoring, ED  Result Value Ref Range   Glucose-Capillary 89 65 - 99 mg/dL   Ct Angio Head W Or Wo Contrast  Result Date: 03/19/2016 CLINICAL DATA:  Acute onset mental status change. EXAM: CT ANGIOGRAPHY HEAD AND NECK TECHNIQUE: Multidetector CT imaging of the head and neck was performed using the standard protocol during bolus administration of intravenous contrast.  Multiplanar CT image reconstructions and MIPs were obtained to evaluate the vascular anatomy. Carotid stenosis measurements (when applicable) are obtained utilizing NASCET criteria, using the distal internal carotid diameter as the denominator. CONTRAST:  50 mL Isovue 370 IV COMPARISON:  CTA head and neck 01/29/2016 FINDINGS: CT HEAD FINDINGS Brain: There is an old left cerebellar infarct. There is left MCA distribution encephalomalacia at the site of prior infarct. No CT evidence of acute cortical infarct. No acute hemorrhage. No midline shift or mass effect. No extra-axial collection. Vascular: No hyperdense vessel or unexpected calcification. Skull: Normal. Negative for fracture or focal lesion. Sinuses: Imaged portions are clear. Orbits: Normal. Review of the MIP images confirms the above findings CTA NECK FINDINGS Aortic arch: There is atherosclerotic calcification within the aortic arch. There is a normal branching pattern. The subclavian arteries are normal proximally. There is atherosclerotic calcification within the proximal arch vessels. Right carotid system: There is mild narrowing of the right common carotid artery at the level of the thyroid secondary to mass effect from adjacent soft tissue. Otherwise, no hemodynamically significant stenosis. The right internal carotid artery is normal. Left carotid system: There is mild atherosclerotic calcification  of the left carotid bifurcation without hemodynamically significant stenosis. Vertebral arteries: There is narrowing of the right vertebral artery origin secondary to atherosclerotic calcification. There is mild calcification of the left vertebral artery origin without significant stenosis. The vertebral system is right dominant. The V2, V3 and V4 segments of the vertebral arteries are normal to the confluence with the basilar artery. Skeleton: There is no bony spinal canal stenosis. No lytic or blastic lesions. Other neck: There is marked soft tissue  prominence within the right carotid space, which is likely secondary to recent right carotid endarterectomy performed March 10, 2016. Upper chest: There is a ground-glass opacity measuring 4 mm in the left upper lobe. Review of the MIP images confirms the above findings CTA HEAD FINDINGS Anterior circulation: --Intracranial internal carotid arteries: Mild calcification of the internal carotid arteries at the skullbase. --Anterior cerebral arteries: Normal. --Middle cerebral arteries: Normal. --Posterior communicating arteries: Absent bilaterally. Posterior circulation: --Posterior cerebral arteries: Normal. --Superior cerebellar arteries: Normal. --Basilar artery: Normal. --Anterior inferior cerebellar arteries: Not visualized, which is not uncommon. --Posterior inferior cerebellar arteries: Normal. Venous sinuses: As permitted by contrast timing, patent. Anatomic variants: None. Delayed phase: No abnormal parenchymal enhancement. Review of the MIP images confirms the above findings IMPRESSION: 1. No acute intracranial abnormality. 2. 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 3. Normal CTA of the circle of Willis and intracranial arteries. 4. Status post right carotid endarterectomy with greatly improved patency. 5. No hemodynamically significant stenosis of the left carotid system. Electronically Signed   By: Ulyses Jarred M.D.   On: 03/19/2016 06:27   Dg Abd 1 View  Result Date: 03/19/2016 CLINICAL DATA:  Constipation. EXAM: ABDOMEN - 1 VIEW COMPARISON:  No recent prior. FINDINGS: Soft tissue structures are unremarkable. Ill-defined radiopacity is noted stomach, this may represent debris within the stomach. Stool noted throughout colon suggests constipation. No bowel distention. No free air. Degenerative changes lumbar spine and both hips. Aortoiliac atherosclerotic vascular calcification. Surgical clips sutures in the pelvis. IMPRESSION: No acute abnormality identified. Prominent amount of  stool in the colon suggesting constipation. Electronically Signed   By: Marcello Moores  Register   On: 03/19/2016 09:56   Ct Head Wo Contrast  Result Date: 03/21/2016 CLINICAL DATA:  Frontal and right-sided headache after dizziness and fall. Right-sided neck pain. Carotid endarterectomy March 10, 2016 EXAM: CT HEAD WITHOUT CONTRAST CT CERVICAL SPINE WITHOUT CONTRAST TECHNIQUE: Multidetector CT imaging of the head and cervical spine was performed following the standard protocol without intravenous contrast. Multiplanar CT image reconstructions of the cervical spine were also generated. COMPARISON:  March 19, 2016 CT scan FINDINGS: CT HEAD FINDINGS Brain: No subdural, epidural, or subarachnoid hemorrhage. Small infarct in the left cerebellar hemisphere, unchanged. Left parietal infarct with encephalomalacia, unchanged. No acute cortical ischemia or infarct. Ventricles and sulci are stable. Brainstem and basal cisterns are normal. No mass, mass effect, or midline shift. Vascular: Calcified atherosclerosis in the intracranial portions of the carotid arteries. Skull: Normal. Negative for fracture or focal lesion. Sinuses/Orbits: No acute finding. Other: None. CT CERVICAL SPINE FINDINGS Alignment: Straightening of normal lordosis with no traumatic malalignment. Skull base and vertebrae: No fractures. Soft tissues and spinal canal: Hematoma in the right side of the neck from recent right carotid endarterectomy exerts mass effect on the right thyroid lobe. The CTA of the neck from March 19, 2016 demonstrated mild narrowing of the carotid artery as it passed posterior to the hematoma. The carotid artery is not well assessed in this region  today. The hematoma is similar in size today. No other acute soft tissue abnormalities. Disc levels: Multilevel degenerative changes with anterior and posterior osteophytes. Partial calcification of the posterior longitudinal ligament. The posterior osteophytes are most prominent at  T1-2 and T2-3 with narrowing of the spinal canal at these levels. At T1-2, the spinal canal measures 7 mm in maximum diameter. Upper chest: Negative. Other: No other abnormalities. IMPRESSION: 1. No acute intracranial process. Left cerebellar and left parietal infarcts, chronic. 2. No fracture or traumatic malalignment in the cervical spine. 3. Degenerative changes. Partial calcification of the posterior longitudinal ligament. Narrowing of the spinal canal due to these findings, most marked at T1-2 and T2-3 with an AP diameter of the spinal canal at T1-2 of 7 mm. 4. Hematoma in the right side of the neck from recent surgery. Electronically Signed   By: Dorise Bullion III M.D   On: 03/21/2016 09:50   Ct Angio Neck W And/or Wo Contrast  Result Date: 03/19/2016 CLINICAL DATA:  Acute onset mental status change. EXAM: CT ANGIOGRAPHY HEAD AND NECK TECHNIQUE: Multidetector CT imaging of the head and neck was performed using the standard protocol during bolus administration of intravenous contrast. Multiplanar CT image reconstructions and MIPs were obtained to evaluate the vascular anatomy. Carotid stenosis measurements (when applicable) are obtained utilizing NASCET criteria, using the distal internal carotid diameter as the denominator. CONTRAST:  50 mL Isovue 370 IV COMPARISON:  CTA head and neck 01/29/2016 FINDINGS: CT HEAD FINDINGS Brain: There is an old left cerebellar infarct. There is left MCA distribution encephalomalacia at the site of prior infarct. No CT evidence of acute cortical infarct. No acute hemorrhage. No midline shift or mass effect. No extra-axial collection. Vascular: No hyperdense vessel or unexpected calcification. Skull: Normal. Negative for fracture or focal lesion. Sinuses: Imaged portions are clear. Orbits: Normal. Review of the MIP images confirms the above findings CTA NECK FINDINGS Aortic arch: There is atherosclerotic calcification within the aortic arch. There is a normal branching  pattern. The subclavian arteries are normal proximally. There is atherosclerotic calcification within the proximal arch vessels. Right carotid system: There is mild narrowing of the right common carotid artery at the level of the thyroid secondary to mass effect from adjacent soft tissue. Otherwise, no hemodynamically significant stenosis. The right internal carotid artery is normal. Left carotid system: There is mild atherosclerotic calcification of the left carotid bifurcation without hemodynamically significant stenosis. Vertebral arteries: There is narrowing of the right vertebral artery origin secondary to atherosclerotic calcification. There is mild calcification of the left vertebral artery origin without significant stenosis. The vertebral system is right dominant. The V2, V3 and V4 segments of the vertebral arteries are normal to the confluence with the basilar artery. Skeleton: There is no bony spinal canal stenosis. No lytic or blastic lesions. Other neck: There is marked soft tissue prominence within the right carotid space, which is likely secondary to recent right carotid endarterectomy performed March 10, 2016. Upper chest: There is a ground-glass opacity measuring 4 mm in the left upper lobe. Review of the MIP images confirms the above findings CTA HEAD FINDINGS Anterior circulation: --Intracranial internal carotid arteries: Mild calcification of the internal carotid arteries at the skullbase. --Anterior cerebral arteries: Normal. --Middle cerebral arteries: Normal. --Posterior communicating arteries: Absent bilaterally. Posterior circulation: --Posterior cerebral arteries: Normal. --Superior cerebellar arteries: Normal. --Basilar artery: Normal. --Anterior inferior cerebellar arteries: Not visualized, which is not uncommon. --Posterior inferior cerebellar arteries: Normal. Venous sinuses: As permitted by contrast timing, patent.  Anatomic variants: None. Delayed phase: No abnormal parenchymal  enhancement. Review of the MIP images confirms the above findings IMPRESSION: 1. No acute intracranial abnormality. 2. 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 3. Normal CTA of the circle of Willis and intracranial arteries. 4. Status post right carotid endarterectomy with greatly improved patency. 5. No hemodynamically significant stenosis of the left carotid system. Electronically Signed   By: Ulyses Jarred M.D.   On: 03/19/2016 06:27   Ct Cervical Spine Wo Contrast  Result Date: 03/21/2016 CLINICAL DATA:  Frontal and right-sided headache after dizziness and fall. Right-sided neck pain. Carotid endarterectomy March 10, 2016 EXAM: CT HEAD WITHOUT CONTRAST CT CERVICAL SPINE WITHOUT CONTRAST TECHNIQUE: Multidetector CT imaging of the head and cervical spine was performed following the standard protocol without intravenous contrast. Multiplanar CT image reconstructions of the cervical spine were also generated. COMPARISON:  March 19, 2016 CT scan FINDINGS: CT HEAD FINDINGS Brain: No subdural, epidural, or subarachnoid hemorrhage. Small infarct in the left cerebellar hemisphere, unchanged. Left parietal infarct with encephalomalacia, unchanged. No acute cortical ischemia or infarct. Ventricles and sulci are stable. Brainstem and basal cisterns are normal. No mass, mass effect, or midline shift. Vascular: Calcified atherosclerosis in the intracranial portions of the carotid arteries. Skull: Normal. Negative for fracture or focal lesion. Sinuses/Orbits: No acute finding. Other: None. CT CERVICAL SPINE FINDINGS Alignment: Straightening of normal lordosis with no traumatic malalignment. Skull base and vertebrae: No fractures. Soft tissues and spinal canal: Hematoma in the right side of the neck from recent right carotid endarterectomy exerts mass effect on the right thyroid lobe. The CTA of the neck from March 19, 2016 demonstrated mild narrowing of the carotid artery as it passed posterior to  the hematoma. The carotid artery is not well assessed in this region today. The hematoma is similar in size today. No other acute soft tissue abnormalities. Disc levels: Multilevel degenerative changes with anterior and posterior osteophytes. Partial calcification of the posterior longitudinal ligament. The posterior osteophytes are most prominent at T1-2 and T2-3 with narrowing of the spinal canal at these levels. At T1-2, the spinal canal measures 7 mm in maximum diameter. Upper chest: Negative. Other: No other abnormalities. IMPRESSION: 1. No acute intracranial process. Left cerebellar and left parietal infarcts, chronic. 2. No fracture or traumatic malalignment in the cervical spine. 3. Degenerative changes. Partial calcification of the posterior longitudinal ligament. Narrowing of the spinal canal due to these findings, most marked at T1-2 and T2-3 with an AP diameter of the spinal canal at T1-2 of 7 mm. 4. Hematoma in the right side of the neck from recent surgery. Electronically Signed   By: Dorise Bullion III M.D   On: 03/21/2016 09:50   Patient seen by me along with the physician assistant. Patient was just recently hospitalized discharged yesterday after admission on November 10. Patient had 2 syncopal episodes overnight. One of proximally around 2 in the morning the other approximately around 4 in the morning. No warning. Patient found herself on the floor. Patient did have evidence of some orthostatic changes here. But never got hypotensive. Patient has known history of fairly severe aortic stenosis and now some mitral valve problems. Although patient was recently admitted and evaluated for the orthostatic changes. Feel that the new development of syncope raises concerns for possibly worsening of the aortic stenosis or arrhythmia. And that she requires admission. Patient's orthostatic changes without significant changes with some fluids. Patient received 1.5 L of fluid. We'll discuss with hospitalist  regarding preadmission.  On exam patient is nontoxic no acute distress. Lungs are clear bilaterally. Abdomen soft and nontender. No significant neuro changes. Heart regular rate and rhythm without any obvious murmurs.     Fredia Sorrow, MD 03/21/16 [id number]

## 2016-03-21 NOTE — ED Notes (Signed)
Pt placed on bedside commode to urinate. Pt then placed back into bed. C/o slight headache, denies needing medicine.

## 2016-03-21 NOTE — ED Notes (Signed)
Attempted report 

## 2016-03-21 NOTE — Consult Note (Signed)
NEURO HOSPITALIST CONSULT NOTE   Requestig physician: Dr. Maylene Roes   Reason for Consult: syncope versus seizure   History obtained from:  Patient   And family  HPI:                                                                                                                                          Marie Malone is an 65 y.o. female who was recently at the hospital for very similar symptoms twitch she was brought to the hospital today. Per the discharge summary at that date "Patient took 2 Tylenol PM's prior to going to bed around 11 PM and woke up just before 2 AM and ask for water. When her husband returned patient was sitting up in the bed but "staring off"and would not respond to him. No apparent seizure activity including no bowel or bladder incontinence. She did not lose consciousness. EMS was called to the home and the patient was found to be tachycardic and hypotensive. She was given IV fluids with improvement in blood pressure."  On discharge it was felt that the episodes were secondary to poor oral intake and hypotension along with dehydration. Patient was doing well at home that night until approximately 2 AM. At 2 AM this morning patient got up states that she went to the bathroom when she got back to the bed she went to reach for a glass of water and felt as though she was going to pass out and then fell to the floor. Husband was awoken and found her diaphoretic, pale, responsive but slow seeing to leave her lay right there. Husband picked her up and brought her to the bed and patient states that she felt better. The second time this happened the husband called the daughter to the room and she found her mother pale diaphoretic on the floor staring straight up at the ceiling. There was no eye deviation, eye fluttering, twitching, arm jerking, tremor, and at times patient would moan and as daughter states " she would go in and out of talking.".   Per EMS report " EMS  reports pale and diaphoretic, no radial pulses upon their arrival. Pt recovered upon laying down. Turned around with 150 CC NS and repositioning in [place] . Alert x4 upon arrival to room." Warm hospital patient had orthostatic vital signs obtained which did show lying 132/67 with a pulse of 80, sitting 132/57 with a pulse of 79, and standing 116/72 with a pulse of 90. However this was after fluids were given and does not look as though there is a significant time taken between blood pressures. In addition it is noted that patient is on a beta blocker. Currently she is supine and in no distress. Patient does admit to not taking a  significant amount of oral intake such as water as she should.  Past Medical History:  Diagnosis Date  . Atrial fibrillation ([place])   . Complication of anesthesia    difficult to wake up from anesthesia  . Heart murmur   . History of rheumatic fever    as a child  . Hyperlipidemia   . Hypertension   . Mitral stenosis   . OSA (obstructive sleep apnea)    mild per sleep study 2008  . Pneumonia    double pneumonia once  . Stroke [facility])     Past Surgical History:  Procedure Laterality Date  . APPENDECTOMY  1989   appendix ruptured,had peritonitis  . CARDIAC SURGERY  2008   "balloon surgery at [facility]"  . COLONOSCOPY W/ POLYPECTOMY    . ENDARTERECTOMY Right 03/10/2016   Procedure: ENDARTERECTOMY CAROTID RIGHT;  Surgeon: Serafina Mitchell, MD;  Location: [place];  Service: Vascular;  Laterality: Right;  . EP IMPLANTABLE DEVICE N/A 03/11/2016   Procedure: Loop Recorder Removal;  Surgeon: Deboraha Sprang, MD;  Location: [place] [facility];  Service: Cardiovascular;  Laterality: N/A;  . 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 LOOP RECORDER IMPLANT N/A 04/10/2013   Procedure: LOOP RECORDER IMPLANT;  Surgeon: Coralyn Mark, MD;  Location: [place] CATH LAB;  Service: Cardiovascular;  Laterality: N/A;  . PATCH ANGIOPLASTY Right  03/10/2016   Procedure: PATCH ANGIOPLASTY CAROTID RIGHT USING Rueben Bash BIOLOGIC PATCH;  Surgeon: Serafina Mitchell, MD;  Location: [place];  Service: Vascular;  Laterality: Right;  . TEE WITHOUT CARDIOVERSION N/A 04/10/2013   Procedure: TRANSESOPHAGEAL ECHOCARDIOGRAM (TEE);  Surgeon: [name], MD;  Location: [facility] ENDOSCOPY;  Service: Cardiovascular;  Laterality: N/A;  . TUBAL LIGATION      Family History  Problem Relation Age of Onset  . Diabetes Mother   . Stroke Mother   . Diabetes Father   . Heart disease Father     CHF  . Cancer Father     kidney  . Diabetes Sister   . Diabetes Brother   . Hypertension Daughter       Social History:  reports that she quit smoking about 40 years ago. Her smoking use included Cigarettes. She started smoking about 45 years ago. She has a 5.00 pack-year smoking history. She has never used smokeless tobacco. She reports that she does not drink alcohol or use drugs.  Allergies  Allergen Reactions  . No Known Allergies     MEDICATIONS:                                                                                                                     No current facility-administered medications for this encounter.    Current Outpatient Prescriptions  Medication Sig Dispense Refill  . acetaminophen (TYLENOL) 325 MG tablet Take 650 mg by mouth every 6 (six) hours as needed for mild pain.     [name] Kitchen amLODipine (NORVASC)  10 MG tablet TAKE ONE TABLET BY MOUTH ONCE DAILY 30 tablet 5  . atorvastatin (LIPITOR) 80 MG tablet TAKE ONE TABLET BY MOUTH ONCE DAILY AT 6 PM 30 tablet 5  . docusate sodium (COLACE) 100 MG capsule Take 1 capsule (100 mg total) by mouth 2 (two) times daily. 10 capsule 0  . metoprolol succinate (TOPROL-XL) 25 MG 24 hr tablet Take 1 tablet (25 mg total) by mouth daily. 30 tablet 5  . oxyCODONE-acetaminophen (PERCOCET/ROXICET) 5-325 MG tablet Take 1-2 tablets by mouth every 4 (four) hours as needed for moderate pain. 10 tablet 0  .  polyethylene glycol (MIRALAX / GLYCOLAX) packet Take 17 g by mouth daily. 14 each 0      ROS:                                                                                                                                       History obtained from the patient  General ROS: negative for - chills, fatigue, fever, night sweats, weight gain or weight loss Psychological ROS: negative for - behavioral disorder, hallucinations, memory difficulties, mood swings or suicidal ideation Ophthalmic ROS: negative for - blurry vision, double vision, eye pain or loss of vision ENT ROS: negative for - epistaxis, nasal discharge, oral lesions, sore throat, tinnitus or vertigo Allergy and Immunology ROS: negative for - hives or itchy/watery eyes Hematological and Lymphatic ROS: negative for - bleeding problems, bruising or swollen lymph nodes Endocrine ROS: negative for - galactorrhea, hair pattern changes, polydipsia/polyuria or temperature intolerance Respiratory ROS: negative for - cough, hemoptysis, shortness of breath or wheezing Cardiovascular ROS: negative for - chest pain, dyspnea on exertion, edema or irregular heartbeat Gastrointestinal ROS: negative for - abdominal pain, diarrhea, hematemesis, nausea/vomiting or stool incontinence Genito-Urinary ROS: negative for - dysuria, hematuria, incontinence or urinary frequency/urgency Musculoskeletal ROS: negative for - joint swelling or muscular weakness Neurological ROS: as noted in HPI Dermatological ROS: negative for rash and skin lesion changes   Blood pressure 150/85, pulse 82, temperature 97.9 F (36.6 C), temperature source Oral, resp. rate 14, SpO2 99 %.   Neurologic Examination:                                                                                                      HEENT-  Normocephalic, no lesions, without obvious abnormality.  Normal external eye and conjunctiva.  Normal TM's bilaterally.  Normal auditory canals and external  ears. Normal external nose, mucus membranes and septum.  Normal  pharynx. Cardiovascular- S1, S2 normal, pulses palpable throughout   Lungs- chest clear, no wheezing, rales, normal symmetric air entry Abdomen- normal findings: bowel sounds normal Extremities- no edema Lymph-no adenopathy palpable Musculoskeletal-no joint tenderness, deformity or swelling Skin-warm and dry, no hyperpigmentation, vitiligo, or suspicious lesions  Neurological Examination Mental Status: Alert, oriented, thought content appropriate.  Speech fluent without evidence of aphasia.  Able to follow 3 step commands without difficulty. Cranial Nerves: II: Discs flat bilaterally; Visual fields grossly normal, pupils equal, round, reactive to light and accommodation III,IV, VI: ptosis not present, extra-ocular motions intact bilaterally V,VII: smile symmetric, facial light touch sensation normal bilaterally VIII: hearing normal bilaterally IX,X: uvula rises symmetrically XI: bilateral shoulder shrug XII: midline tongue extension Motor: Right : Upper extremity   5/5    Left:     Upper extremity   5/5  Lower extremity   5/5     Lower extremity   5/5 Tone and bulk:normal tone throughout; no atrophy noted Sensory: Pinprick and light touch intact throughout, bilaterally Deep Tendon Reflexes: 1+ and symmetric throughout Plantars: Right: downgoing   Left: downgoing Cerebellar: normal finger-to-nose,and normal heel-to-shin test Gait: Not tested      Lab Results: Basic Metabolic Panel:  Recent Labs Lab 03/19/16 0318 03/19/16 0420 03/19/16 1028 03/20/16 0445 03/21/16 0641  NA 138  --   --  139 137  K 3.1*  --   --  5.3* 3.9  CL 103  --   --  107 104  CO2 22  --   --  24 22  GLUCOSE 143*  --   --  93 101*  BUN 12  --   --  13 16  CREATININE 0.95  --   --  0.75 0.82  CALCIUM 9.7  --   --  9.6 9.7  MG  --  2.0  --   --   --   PHOS  --   --  3.7  --   --     Liver Function Tests:  Recent Labs Lab  03/19/16 0318  AST 28  ALT 24  ALKPHOS 75  BILITOT 1.2  PROT 8.5*  ALBUMIN 3.7   No results for input(s): LIPASE, AMYLASE in the last 168 hours. No results for input(s): AMMONIA in the last 168 hours.  CBC:  Recent Labs Lab 03/19/16 0318 03/21/16 0641  WBC 5.6 6.5  NEUTROABS 3.4  --   HGB 11.9* 11.5*  HCT 37.6 36.9  MCV 84.3 84.4  PLT 183 237    Cardiac Enzymes: No results for input(s): CKTOTAL, CKMB, CKMBINDEX, TROPONINI in the last 168 hours.  Lipid Panel: No results for input(s): CHOL, TRIG, HDL, CHOLHDL, VLDL, LDLCALC in the last 168 hours.  CBG:  Recent Labs Lab 03/21/16 8416  SAYTKZ 60    Microbiology: Results for orders placed or performed during the hospital encounter of 03/04/16  Surgical pcr screen     Status: None   Collection Time: 03/04/16  8:37 AM  Result Value Ref Range Status   MRSA, PCR NEGATIVE NEGATIVE Final   Staphylococcus aureus NEGATIVE NEGATIVE Final    Comment:        The Xpert SA Assay (FDA approved for NASAL specimens in patients over 14 years of age), is one component of a comprehensive surveillance program.  Test performance has been validated by [facility] for patients greater than or equal to 86 year old. It is not intended to diagnose infection nor to guide or monitor treatment.  Coagulation Studies:  Recent Labs  03/19/16 0318 03/20/16 0445 03/21/16 0641  LABPROT 39.4* 39.5* 25.3*  INR 3.92 3.93 2.26    Imaging: Ct Head Wo Contrast  Result Date: 03/21/2016 CLINICAL DATA:  Frontal and right-sided headache after dizziness and fall. Right-sided neck pain. Carotid endarterectomy March 10, 2016 EXAM: CT HEAD WITHOUT CONTRAST CT CERVICAL SPINE WITHOUT CONTRAST TECHNIQUE: Multidetector CT imaging of the head and cervical spine was performed following the standard protocol without intravenous contrast. Multiplanar CT image reconstructions of the cervical spine were also generated. COMPARISON:  March 19, 2016 CT scan FINDINGS: CT HEAD FINDINGS Brain: No subdural, epidural, or subarachnoid hemorrhage. Small infarct in the left cerebellar hemisphere, unchanged. Left parietal infarct with encephalomalacia, unchanged. No acute cortical ischemia or infarct. Ventricles and sulci are stable. Brainstem and basal cisterns are normal. No mass, mass effect, or midline shift. Vascular: Calcified atherosclerosis in the intracranial portions of the carotid arteries. Skull: Normal. Negative for fracture or focal lesion. Sinuses/Orbits: No acute finding. Other: None. CT CERVICAL SPINE FINDINGS Alignment: Straightening of normal lordosis with no traumatic malalignment. Skull base and vertebrae: No fractures. Soft tissues and spinal canal: Hematoma in the right side of the neck from recent right carotid endarterectomy exerts mass effect on the right thyroid lobe. The CTA of the neck from March 19, 2016 demonstrated mild narrowing of the carotid artery as it passed posterior to the hematoma. The carotid artery is not well assessed in this region today. The hematoma is similar in size today. No other acute soft tissue abnormalities. Disc levels: Multilevel degenerative changes with anterior and posterior osteophytes. Partial calcification of the posterior longitudinal ligament. The posterior osteophytes are most prominent at T1-2 and T2-3 with narrowing of the spinal canal at these levels. At T1-2, the spinal canal measures 7 mm in maximum diameter. Upper chest: Negative. Other: No other abnormalities. IMPRESSION: 1. No acute intracranial process. Left cerebellar and left parietal infarcts, chronic. 2. No fracture or traumatic malalignment in the cervical spine. 3. Degenerative changes. Partial calcification of the posterior longitudinal ligament. Narrowing of the spinal canal due to these findings, most marked at T1-2 and T2-3 with an AP diameter of the spinal canal at T1-2 of 7 mm. 4. Hematoma in the right side of the neck from  recent surgery. Electronically Signed   By: Dorise Bullion III M.D   On: 03/21/2016 09:50   Ct Cervical Spine Wo Contrast  Result Date: 03/21/2016 CLINICAL DATA:  Frontal and right-sided headache after dizziness and fall. Right-sided neck pain. Carotid endarterectomy March 10, 2016 EXAM: CT HEAD WITHOUT CONTRAST CT CERVICAL SPINE WITHOUT CONTRAST TECHNIQUE: Multidetector CT imaging of the head and cervical spine was performed following the standard protocol without intravenous contrast. Multiplanar CT image reconstructions of the cervical spine were also generated. COMPARISON:  March 19, 2016 CT scan FINDINGS: CT HEAD FINDINGS Brain: No subdural, epidural, or subarachnoid hemorrhage. Small infarct in the left cerebellar hemisphere, unchanged. Left parietal infarct with encephalomalacia, unchanged. No acute cortical ischemia or infarct. Ventricles and sulci are stable. Brainstem and basal cisterns are normal. No mass, mass effect, or midline shift. Vascular: Calcified atherosclerosis in the intracranial portions of the carotid arteries. Skull: Normal. Negative for fracture or focal lesion. Sinuses/Orbits: No acute finding. Other: None. CT CERVICAL SPINE FINDINGS Alignment: Straightening of normal lordosis with no traumatic malalignment. Skull base and vertebrae: No fractures. Soft tissues and spinal canal: Hematoma in the right side of the neck from recent right carotid endarterectomy exerts mass  effect on the right thyroid lobe. The CTA of the neck from March 19, 2016 demonstrated mild narrowing of the carotid artery as it passed posterior to the hematoma. The carotid artery is not well assessed in this region today. The hematoma is similar in size today. No other acute soft tissue abnormalities. Disc levels: Multilevel degenerative changes with anterior and posterior osteophytes. Partial calcification of the posterior longitudinal ligament. The posterior osteophytes are most prominent at T1-2 and T2-3  with narrowing of the spinal canal at these levels. At T1-2, the spinal canal measures 7 mm in maximum diameter. Upper chest: Negative. Other: No other abnormalities. IMPRESSION: 1. No acute intracranial process. Left cerebellar and left parietal infarcts, chronic. 2. No fracture or traumatic malalignment in the cervical spine. 3. Degenerative changes. Partial calcification of the posterior longitudinal ligament. Narrowing of the spinal canal due to these findings, most marked at T1-2 and T2-3 with an AP diameter of the spinal canal at T1-2 of 7 mm. 4. Hematoma in the right side of the neck from recent surgery. Electronically Signed   By: Dorise Bullion III M.D   On: 03/21/2016 09:50       Assessment and plan per attending neurologist  Etta Quill PA-C [facility] [phone]  03/21/2016, 1:32 PM   Assessment/Plan:

## 2016-03-22 ENCOUNTER — Observation Stay (HOSPITAL_COMMUNITY): Payer: BC Managed Care – PPO

## 2016-03-22 DIAGNOSIS — R404 Transient alteration of awareness: Secondary | ICD-10-CM

## 2016-03-22 LAB — CBC
HCT: 39.5 % (ref 36.0–46.0)
Hemoglobin: 12.3 g/dL (ref 12.0–15.0)
MCH: 26.3 pg (ref 26.0–34.0)
MCHC: 31.1 g/dL (ref 30.0–36.0)
MCV: 84.6 fL (ref 78.0–100.0)
PLATELETS: 243 10*3/uL (ref 150–400)
RBC: 4.67 MIL/uL (ref 3.87–5.11)
RDW: 16.1 % — ABNORMAL HIGH (ref 11.5–15.5)
WBC: 4.4 10*3/uL (ref 4.0–10.5)

## 2016-03-22 LAB — BASIC METABOLIC PANEL
Anion gap: 10 (ref 5–15)
BUN: 15 mg/dL (ref 6–20)
CALCIUM: 9.6 mg/dL (ref 8.9–10.3)
CO2: 25 mmol/L (ref 22–32)
CREATININE: 0.83 mg/dL (ref 0.44–1.00)
Chloride: 104 mmol/L (ref 101–111)
GFR calc non Af Amer: >60 mL/min (ref >60)
Glucose, Bld: 103 mg/dL — ABNORMAL HIGH (ref 65–99)
Potassium: 3.7 mmol/L (ref 3.5–5.1)
SODIUM: 139 mmol/L (ref 135–145)

## 2016-03-22 LAB — PROTIME-INR
INR: 1.85
PROTHROMBIN TIME: 21.6 s — AB (ref 11.4–15.2)

## 2016-03-22 MED ORDER — WARFARIN SODIUM 5 MG PO TABS
5.0000 mg | ORAL_TABLET | Freq: Once | ORAL | Status: AC
  Administered 2016-03-22: 5 mg via ORAL
  Filled 2016-03-22: qty 1

## 2016-03-22 NOTE — Progress Notes (Signed)
Still awaiting EEG. However looking at patient's orthostatics highly believe this likely is due to blood pressure issues.  As recent orthostatics are as follows. 152/61, sitting 122/71, standing 118/62--when the 3 minute orthostatics standing vitals were to be taken patient could not continue to remain standing as she felt as though she is gone to pass out and felt dizzy. We'll follow up on patient after EEG has been obtained.   Etta Quill PA-C [facility] [phone]  M-F  (8:30 am- 4 PM)  03/22/2016, 11:25 AM

## 2016-03-22 NOTE — Telephone Encounter (Signed)
Called pt, she has been readmitted. Will follow-up post-discharge.

## 2016-03-22 NOTE — Procedures (Signed)
ELECTROENCEPHALOGRAM REPORT  Date of Study: 03/22/2016  Patient's Name: Marie Malone MRN: [id number] Date of Birth: 1950/09/28  Referring Provider: Etta Quill, PA-C  Clinical History: This is a 65 year old woman with an episode of unresponsiveness.  Medications:  atorvastatin (LIPITOR) tablet 80 mg   docusate sodium (COLACE) capsule 100 mg   polyethylene glycol (MIRALAX / GLYCOLAX) packet 17 g   warfarin (COUMADIN) tablet 5 mg   Technical Summary: A multichannel digital EEG recording measured by the international 10-20 system with electrodes applied with paste and impedances below 5000 ohms performed in our laboratory with EKG monitoring in an awake and asleep patient.  Hyperventilation was not performed. Photic stimulation was performed.  The digital EEG was referentially recorded, reformatted, and digitally filtered in a variety of bipolar and referential montages for optimal display.    Description: The patient is awake and asleep during the recording.  During maximal wakefulness, there is a symmetric, medium voltage 8.5 Hz posterior dominant rhythm that attenuates with eye opening.  The record is symmetric.  During drowsiness and sleep, there is an increase in theta slowing of the background, at times sharply contoured over the bilateral temporal regions without clear epileptogenic potential.  Vertex waves and symmetric sleep spindles were seen.  Photic stimulation did not elicit any abnormalities.  There were no epileptiform discharges or electrographic seizures seen.    EKG lead was unremarkable.  Impression: This awake and asleep EEG is normal.    Clinical Correlation: A normal EEG does not exclude a clinical diagnosis of epilepsy. Clinical correlation is advised.   Ellouise Newer, M.D.

## 2016-03-22 NOTE — Progress Notes (Signed)
PROGRESS NOTE    Marie Malone  OVZ:[id number] DOB: 08-29-1950 DOA: 03/21/2016 PCP: [name], NP     Brief Narrative:  Marie Biber Lipfordis a 65 y.o.femalewith medical history significant for hypertension, dyslipidemia, known mitral stenosis and moderate to severe pulmonary hypertension, history of prior CVA, recent right carotid endarterectomy on 03/10/16, Atrialfibrillation on warfarin, prior aortic valvulotomy in 2008. She was admitted to the hospital from 11/10-11/11 after a "staring" spell. Work up was non-revealing and her symptoms had completely resolved. She was discharged home did well throughout the day, but had 2 more episodes last night. She states that she was getting a cup of water when she "felt off." The next thing she knew, she was on the floor. She believes she passed out. Her husband witnessed the event and stated that she did not lose consciousness, but was unresponsive for 5 minutes. No observed twitching, loss of bladder/bowel. This happened a total of 2 times last night. She was slightly confused after episodes but otherwise back to baseline now. She has no other complaints of pain, chest pain, SOB, N/V/D, abdominal pain.  Assessment & Plan:   Principal Problem:   Altered mental state Active Problems:   HTN (hypertension)   Atrial fibrillation (HCC)   HLD (hyperlipidemia)   Asymptomatic stenosis of right carotid artery s/p right CEA 03/10/16   Constipation   Altered mentation, syncope, orthostasis vs seizure  -Previous admission thought to be orthostatic etiology and had resolved and discharged home. Now re-admitted with 2 additional episodes at home. Husband denies actual loss of consciousness, but admits to falling and "staring". Patient was apparently awake but not responsive to voice.  -Orthostatic positive this morning  -Neurology following  -EEG pending   New neurologic deficit of tongue deviation to the right -Previous hx of left MCA stroke,  but neuro exam has been normal on my previous examinations and per chart review  -MRI brain today   Paroxysmal atrial fibrillation on warfarin -Patient currently in sinus rhythm with first-degree AV block -INR therapeutic. Pharmacy consulted for warfarin dosing  -Cardiac monitoring   HTN -Hold norvasc, metoprolol in light of orthostasis this morning   History of CVA (cerebrovascular accident)/Asymptomatic stenosis of right carotid artery s/p right CEA 03/10/16 -CTA  Head and neck on 11/10 with patent right carotid endarterecomy site  -Vascular sx evaluated patient on 11/10-11/11: Operative site with hematoma, monitor site. Follow up with vascular surgery outpatient.  -Monitor hematoma site  Constipation -Colace 100 mg twice a day -MiraLAX 17 g daily  Mitral stenosis/Mildpulmonary hypertension -Echocardiogram in August 2017 showed normal ejection fraction with severe diffuse thickening and calcification of aortic valve, prior mitral valvuloplasty with severely thickened mitral valve leaflets and associated moderate mitral stenosis-which has progressed since 2016, and pulmonary hypertension was characterized as mild with associated increase in right ventricular systolic pressure  -Repeat echo to rule out cardiac etiology   HLD  -Continue Lipitor   DVT prophylaxis: coumadin Code Status: full Family Communication: daughter at bedside  Disposition Plan: pending further work up    Consultants:   Neurology  Procedures:   None  Antimicrobials:   None     Subjective: Patient complains of a mild right sided headache. She has had this headache since her right CEA earlier this month, and states it has been improving since then. She admits to dizziness this morning with standing and was orthostatic positive. Otherwise no new complaints. No CP, SOB, N/V/D abdominal pain, focal weakness.   Objective: Vitals:  03/21/16 1330 03/21/16 1504 03/21/16 2313 03/22/16 0601  BP:  131/70  (!) 129/56 (!) 141/68  Pulse: 78 95 82 77  Resp: 16 16 16 16   Temp:  98.6 F (37 C) 98.2 F (36.8 C) 98.4 F (36.9 C)  TempSrc:  Oral Oral Oral  SpO2: 99% 100% 100% 97%  Weight:  72.8 kg (160 lb 6.4 oz)    Height:  5\' 2"  (1.575 m)     No intake or output data in the 24 hours ending 03/22/16 0733 Filed Weights   03/21/16 1504  Weight: 72.8 kg (160 lb 6.4 oz)    Examination:  General exam: Appears calm and comfortable  Respiratory system: Clear to auscultation. Respiratory effort normal. Cardiovascular system: S1 & S2 heard, RRR. No JVD, murmurs, rubs, gallops or clicks. No pedal edema. Gastrointestinal system: Abdomen is nondistended, soft and nontender. No organomegaly or masses felt. Normal bowel sounds heard. Central nervous system: Alert and oriented. Tongue deviation to the right. This is a new exam finding since yesterday. Extremities: Symmetric 5 x 5 power. Skin: No rashes, lesions or ulcers Psychiatry: Judgement and insight appear normal. Mood & affect appropriate.   Data Reviewed: I have personally reviewed following labs and imaging studies  CBC:  Recent Labs Lab 03/19/16 0318 03/21/16 0641  WBC 5.6 6.5  NEUTROABS 3.4  --   HGB 11.9* 11.5*  HCT 37.6 36.9  MCV 84.3 84.4  PLT 183 637   Basic Metabolic Panel:  Recent Labs Lab 03/19/16 0318 03/19/16 0420 03/19/16 1028 03/20/16 0445 03/21/16 0641  NA 138  --   --  139 137  K 3.1*  --   --  5.3* 3.9  CL 103  --   --  107 104  CO2 22  --   --  24 22  GLUCOSE 143*  --   --  93 101*  BUN 12  --   --  13 16  CREATININE 0.95  --   --  0.75 0.82  CALCIUM 9.7  --   --  9.6 9.7  MG  --  2.0  --   --   --   PHOS  --   --  3.7  --   --    GFR: Estimated Creatinine Clearance: 64.8 mL/min (by C-G formula based on SCr of 0.82 mg/dL). Liver Function Tests:  Recent Labs Lab 03/19/16 0318  AST 28  ALT 24  ALKPHOS 75  BILITOT 1.2  PROT 8.5*  ALBUMIN 3.7   No results for input(s): LIPASE, AMYLASE  in the last 168 hours. No results for input(s): AMMONIA in the last 168 hours. Coagulation Profile:  Recent Labs Lab 03/16/16 1228 03/19/16 0318 03/20/16 0445 03/21/16 0641  INR 3.3 3.92 3.93 2.26   Cardiac Enzymes: No results for input(s): CKTOTAL, CKMB, CKMBINDEX, TROPONINI in the last 168 hours. BNP (last 3 results)  Recent Labs  10/10/15 1343 01/20/16 1539  PROBNP 238.0* 136.0*   HbA1C: No results for input(s): HGBA1C in the last 72 hours. CBG:  Recent Labs Lab 03/21/16 0707  GLUCAP 89   Lipid Profile: No results for input(s): CHOL, HDL, LDLCALC, TRIG, CHOLHDL, LDLDIRECT in the last 72 hours. Thyroid Function Tests: No results for input(s): TSH, T4TOTAL, FREET4, T3FREE, THYROIDAB in the last 72 hours. Anemia Panel: No results for input(s): VITAMINB12, FOLATE, FERRITIN, TIBC, IRON, RETICCTPCT in the last 72 hours. Sepsis Labs: No results for input(s): PROCALCITON, LATICACIDVEN in the last 168 hours.  No results found for  this or any previous visit (from the past 240 hour(s)).     Radiology Studies: Ct Head Wo Contrast  Result Date: 03/21/2016 CLINICAL DATA:  Frontal and right-sided headache after dizziness and fall. Right-sided neck pain. Carotid endarterectomy March 10, 2016 EXAM: CT HEAD WITHOUT CONTRAST CT CERVICAL SPINE WITHOUT CONTRAST TECHNIQUE: Multidetector CT imaging of the head and cervical spine was performed following the standard protocol without intravenous contrast. Multiplanar CT image reconstructions of the cervical spine were also generated. COMPARISON:  March 19, 2016 CT scan FINDINGS: CT HEAD FINDINGS Brain: No subdural, epidural, or subarachnoid hemorrhage. Small infarct in the left cerebellar hemisphere, unchanged. Left parietal infarct with encephalomalacia, unchanged. No acute cortical ischemia or infarct. Ventricles and sulci are stable. Brainstem and basal cisterns are normal. No mass, mass effect, or midline shift. Vascular: Calcified  atherosclerosis in the intracranial portions of the carotid arteries. Skull: Normal. Negative for fracture or focal lesion. Sinuses/Orbits: No acute finding. Other: None. CT CERVICAL SPINE FINDINGS Alignment: Straightening of normal lordosis with no traumatic malalignment. Skull base and vertebrae: No fractures. Soft tissues and spinal canal: Hematoma in the right side of the neck from recent right carotid endarterectomy exerts mass effect on the right thyroid lobe. The CTA of the neck from March 19, 2016 demonstrated mild narrowing of the carotid artery as it passed posterior to the hematoma. The carotid artery is not well assessed in this region today. The hematoma is similar in size today. No other acute soft tissue abnormalities. Disc levels: Multilevel degenerative changes with anterior and posterior osteophytes. Partial calcification of the posterior longitudinal ligament. The posterior osteophytes are most prominent at T1-2 and T2-3 with narrowing of the spinal canal at these levels. At T1-2, the spinal canal measures 7 mm in maximum diameter. Upper chest: Negative. Other: No other abnormalities. IMPRESSION: 1. No acute intracranial process. Left cerebellar and left parietal infarcts, chronic. 2. No fracture or traumatic malalignment in the cervical spine. 3. Degenerative changes. Partial calcification of the posterior longitudinal ligament. Narrowing of the spinal canal due to these findings, most marked at T1-2 and T2-3 with an AP diameter of the spinal canal at T1-2 of 7 mm. 4. Hematoma in the right side of the neck from recent surgery. Electronically Signed   By: Dorise Bullion III M.D   On: 03/21/2016 09:50   Ct Cervical Spine Wo Contrast  Result Date: 03/21/2016 CLINICAL DATA:  Frontal and right-sided headache after dizziness and fall. Right-sided neck pain. Carotid endarterectomy March 10, 2016 EXAM: CT HEAD WITHOUT CONTRAST CT CERVICAL SPINE WITHOUT CONTRAST TECHNIQUE: Multidetector CT  imaging of the head and cervical spine was performed following the standard protocol without intravenous contrast. Multiplanar CT image reconstructions of the cervical spine were also generated. COMPARISON:  March 19, 2016 CT scan FINDINGS: CT HEAD FINDINGS Brain: No subdural, epidural, or subarachnoid hemorrhage. Small infarct in the left cerebellar hemisphere, unchanged. Left parietal infarct with encephalomalacia, unchanged. No acute cortical ischemia or infarct. Ventricles and sulci are stable. Brainstem and basal cisterns are normal. No mass, mass effect, or midline shift. Vascular: Calcified atherosclerosis in the intracranial portions of the carotid arteries. Skull: Normal. Negative for fracture or focal lesion. Sinuses/Orbits: No acute finding. Other: None. CT CERVICAL SPINE FINDINGS Alignment: Straightening of normal lordosis with no traumatic malalignment. Skull base and vertebrae: No fractures. Soft tissues and spinal canal: Hematoma in the right side of the neck from recent right carotid endarterectomy exerts mass effect on the right thyroid lobe. The CTA of the  neck from March 19, 2016 demonstrated mild narrowing of the carotid artery as it passed posterior to the hematoma. The carotid artery is not well assessed in this region today. The hematoma is similar in size today. No other acute soft tissue abnormalities. Disc levels: Multilevel degenerative changes with anterior and posterior osteophytes. Partial calcification of the posterior longitudinal ligament. The posterior osteophytes are most prominent at T1-2 and T2-3 with narrowing of the spinal canal at these levels. At T1-2, the spinal canal measures 7 mm in maximum diameter. Upper chest: Negative. Other: No other abnormalities. IMPRESSION: 1. No acute intracranial process. Left cerebellar and left parietal infarcts, chronic. 2. No fracture or traumatic malalignment in the cervical spine. 3. Degenerative changes. Partial calcification of the  posterior longitudinal ligament. Narrowing of the spinal canal due to these findings, most marked at T1-2 and T2-3 with an AP diameter of the spinal canal at T1-2 of 7 mm. 4. Hematoma in the right side of the neck from recent surgery. Electronically Signed   By: Dorise Bullion III M.D   On: 03/21/2016 09:50      Scheduled Meds: . amLODipine  10 mg Oral Daily  . atorvastatin  80 mg Oral q1800  . docusate sodium  100 mg Oral BID  . polyethylene glycol  17 g Oral Daily  . Warfarin - Pharmacist Dosing Inpatient   Does not apply q1800   Continuous Infusions:   LOS: 0 days    Time spent: 30 minutes   Dessa Phi, DO [facility] [email or web address] Password [facility] 03/22/2016, 7:33 AM

## 2016-03-22 NOTE — Progress Notes (Signed)
ANTICOAGULATION CONSULT NOTE - Follow up Marie Malone for warfarin Indication: atrial fibrillation  Vital Signs: Temp: 98.4 F (36.9 C) (11/13 0800) Temp Source: Oral (11/13 0800) BP: 128/76 (11/13 0800) Pulse Rate: 80 (11/13 0800)  Labs:  Recent Labs  03/20/16 0445 03/21/16 0641 03/22/16 0905  HGB  --  11.5* 12.3  HCT  --  36.9 39.5  PLT  --  237 243  LABPROT 39.5* 25.3* 21.6*  INR 3.93 2.26 1.85  CREATININE 0.75 0.82 0.83    Estimated Creatinine Clearance: 64 mL/min (by C-G formula based on SCr of 0.83 mg/dL).   Medical History: Past Medical History:  Diagnosis Date  . Atrial fibrillation ([place])   . Complication of anesthesia    difficult to wake up from anesthesia  . Heart murmur   . History of rheumatic fever    as a child  . Hyperlipidemia   . Hypertension   . Mitral stenosis   . OSA (obstructive sleep apnea)    mild per sleep study 2008  . Pneumonia    double pneumonia once  . Stroke [facility])    Assessment: 65 yo F re-admitted on 11/12 with syncope/LOC (admitted 11/10-11/11 for same). PTA warfarin for history of afib. INR therapeutic on admission at 2.26. Last dose 11/9 - held during previous admission due to supratherapeutic INR.   Coumadin restart yesterday evening. INR now subtherapeutic at 1.85. CBC stable.   PTA dose: 5 mg daily except 7.5 mg on Wednesdays. ich Warfarin was held due to supratherapeutic INR.   Goal of Therapy:  INR 2-3 Monitor platelets by anticoagulation protocol: Yes   Plan:  - Warfarin 5 mg tonight - Daily INR - Monitor CBC, s/sx of bleeding   Stephens November, PharmD Clinical Pharmacist 10:33 AM, 03/22/2016

## 2016-03-22 NOTE — Progress Notes (Signed)
Routine EEG completed, results pending. 

## 2016-03-23 ENCOUNTER — Observation Stay (HOSPITAL_BASED_OUTPATIENT_CLINIC_OR_DEPARTMENT_OTHER): Payer: BC Managed Care – PPO

## 2016-03-23 ENCOUNTER — Ambulatory Visit: Payer: BC Managed Care – PPO

## 2016-03-23 DIAGNOSIS — Z8249 Family history of ischemic heart disease and other diseases of the circulatory system: Secondary | ICD-10-CM | POA: Diagnosis not present

## 2016-03-23 DIAGNOSIS — Z01818 Encounter for other preprocedural examination: Secondary | ICD-10-CM | POA: Diagnosis not present

## 2016-03-23 DIAGNOSIS — I441 Atrioventricular block, second degree: Secondary | ICD-10-CM | POA: Diagnosis present

## 2016-03-23 DIAGNOSIS — E785 Hyperlipidemia, unspecified: Secondary | ICD-10-CM | POA: Diagnosis present

## 2016-03-23 DIAGNOSIS — I951 Orthostatic hypotension: Principal | ICD-10-CM

## 2016-03-23 DIAGNOSIS — I341 Nonrheumatic mitral (valve) prolapse: Secondary | ICD-10-CM | POA: Diagnosis not present

## 2016-03-23 DIAGNOSIS — K148 Other diseases of tongue: Secondary | ICD-10-CM | POA: Diagnosis present

## 2016-03-23 DIAGNOSIS — M26213 Malocclusion, Angle's class III: Secondary | ICD-10-CM | POA: Diagnosis present

## 2016-03-23 DIAGNOSIS — I5032 Chronic diastolic (congestive) heart failure: Secondary | ICD-10-CM | POA: Diagnosis present

## 2016-03-23 DIAGNOSIS — Z7901 Long term (current) use of anticoagulants: Secondary | ICD-10-CM | POA: Diagnosis not present

## 2016-03-23 DIAGNOSIS — R404 Transient alteration of awareness: Secondary | ICD-10-CM | POA: Diagnosis present

## 2016-03-23 DIAGNOSIS — I342 Nonrheumatic mitral (valve) stenosis: Secondary | ICD-10-CM | POA: Diagnosis not present

## 2016-03-23 DIAGNOSIS — I2510 Atherosclerotic heart disease of native coronary artery without angina pectoris: Secondary | ICD-10-CM | POA: Diagnosis not present

## 2016-03-23 DIAGNOSIS — I10 Essential (primary) hypertension: Secondary | ICD-10-CM | POA: Diagnosis not present

## 2016-03-23 DIAGNOSIS — Z01810 Encounter for preprocedural cardiovascular examination: Secondary | ICD-10-CM | POA: Diagnosis not present

## 2016-03-23 DIAGNOSIS — Z79899 Other long term (current) drug therapy: Secondary | ICD-10-CM | POA: Diagnosis not present

## 2016-03-23 DIAGNOSIS — R55 Syncope and collapse: Secondary | ICD-10-CM | POA: Diagnosis present

## 2016-03-23 DIAGNOSIS — Z833 Family history of diabetes mellitus: Secondary | ICD-10-CM | POA: Diagnosis not present

## 2016-03-23 DIAGNOSIS — I340 Nonrheumatic mitral (valve) insufficiency: Secondary | ICD-10-CM | POA: Diagnosis not present

## 2016-03-23 DIAGNOSIS — I050 Rheumatic mitral stenosis: Secondary | ICD-10-CM

## 2016-03-23 DIAGNOSIS — I480 Paroxysmal atrial fibrillation: Secondary | ICD-10-CM

## 2016-03-23 DIAGNOSIS — I2721 Secondary pulmonary arterial hypertension: Secondary | ICD-10-CM | POA: Diagnosis not present

## 2016-03-23 DIAGNOSIS — I110 Hypertensive heart disease with heart failure: Secondary | ICD-10-CM | POA: Diagnosis present

## 2016-03-23 DIAGNOSIS — I482 Chronic atrial fibrillation: Secondary | ICD-10-CM | POA: Diagnosis not present

## 2016-03-23 DIAGNOSIS — G4733 Obstructive sleep apnea (adult) (pediatric): Secondary | ICD-10-CM | POA: Diagnosis present

## 2016-03-23 DIAGNOSIS — K045 Chronic apical periodontitis: Secondary | ICD-10-CM | POA: Diagnosis present

## 2016-03-23 DIAGNOSIS — R4182 Altered mental status, unspecified: Secondary | ICD-10-CM | POA: Diagnosis present

## 2016-03-23 DIAGNOSIS — Z8673 Personal history of transient ischemic attack (TIA), and cerebral infarction without residual deficits: Secondary | ICD-10-CM | POA: Diagnosis not present

## 2016-03-23 DIAGNOSIS — I2582 Chronic total occlusion of coronary artery: Secondary | ICD-10-CM | POA: Diagnosis present

## 2016-03-23 DIAGNOSIS — Z952 Presence of prosthetic heart valve: Secondary | ICD-10-CM | POA: Diagnosis not present

## 2016-03-23 DIAGNOSIS — Z87891 Personal history of nicotine dependence: Secondary | ICD-10-CM | POA: Diagnosis not present

## 2016-03-23 DIAGNOSIS — I272 Pulmonary hypertension, unspecified: Secondary | ICD-10-CM | POA: Diagnosis present

## 2016-03-23 DIAGNOSIS — K5900 Constipation, unspecified: Secondary | ICD-10-CM | POA: Diagnosis present

## 2016-03-23 DIAGNOSIS — Z823 Family history of stroke: Secondary | ICD-10-CM | POA: Diagnosis not present

## 2016-03-23 DIAGNOSIS — I6521 Occlusion and stenosis of right carotid artery: Secondary | ICD-10-CM | POA: Diagnosis present

## 2016-03-23 LAB — ECHOCARDIOGRAM COMPLETE
Height: 62 in
Weight: 2566.4 oz

## 2016-03-23 LAB — PROTIME-INR
INR: 1.95
Prothrombin Time: 22.5 seconds — ABNORMAL HIGH (ref 11.4–15.2)

## 2016-03-23 MED ORDER — HEPARIN (PORCINE) IN NACL 100-0.45 UNIT/ML-% IJ SOLN
650.0000 [IU]/h | INTRAMUSCULAR | Status: DC
  Administered 2016-03-23: 1000 [IU]/h via INTRAVENOUS
  Administered 2016-03-25: 700 [IU]/h via INTRAVENOUS
  Filled 2016-03-23 (×2): qty 250

## 2016-03-23 MED ORDER — WARFARIN SODIUM 5 MG PO TABS: 5.0000 mg | ORAL_TABLET | Freq: Once | ORAL | Status: DC

## 2016-03-23 NOTE — Progress Notes (Signed)
  Echocardiogram 2D Echocardiogram has been performed.  Marie Malone 03/23/2016, 3:55 PM

## 2016-03-23 NOTE — Progress Notes (Signed)
PROGRESS NOTE    Marie Malone  BUL:[id number] DOB: Feb 21, 1951 DOA: 03/21/2016 PCP: [name], NP     Brief Narrative:  Marie Gellis Lipfordis a 65 y.o.femalewith medical history significant for hypertension, dyslipidemia, known mitral stenosis and moderate to severe pulmonary hypertension, history of prior CVA, recent right carotid endarterectomy on 03/10/16, Atrialfibrillation on warfarin, prior aortic valvulotomy in 2008. She was admitted to the hospital from 11/10-11/11 after a "staring" spell. Work up was non-revealing and her symptoms had completely resolved. She was discharged home did well throughout the day, but had 2 more episodes last night. She states that she was getting a cup of water when she "felt off." The next thing she knew, she was on the floor. She believes she passed out. Her husband witnessed the event and stated that she did not lose consciousness, but was unresponsive for 5 minutes. No observed twitching, loss of bladder/bowel. This happened a total of 2 times last night. She was slightly confused after episodes but otherwise back to baseline now. She has no other complaints of pain, chest pain, SOB, N/V/D, abdominal pain. Neurology was consulted due to concern for seizures. EEG was completed. Symptoms are more likely secondary to orthostasis. Cardiology was consulted and echo pending.   Assessment & Plan:   Principal Problem:   Transient alteration of awareness Active Problems:   HTN (hypertension)   Atrial fibrillation (HCC)   HLD (hyperlipidemia)   Asymptomatic stenosis of right carotid artery s/p right CEA 03/10/16   Constipation   Altered mentation, syncope, orthostasis vs seizure  -Previous admission thought to be orthostatic etiology. IVF given, episodes had resolved and discharged home. Now re-admitted with 2 additional episodes at home. Husband denies actual loss of consciousness, but admits to falling and "staring". Patient was apparently awake  but not responsive to voice.  -Neurology was consulted and evaluated patient. EEG normal. Believe episodes to be more consistent with orthostasis. Signed off.  -Orthostatic positive 152/61 --> 118/62 with dizziness  -Consulted cardiology today   -Echo pending -PT/OT   New neurologic deficit of tongue deviation to the right -Previous hx of left MCA stroke, but neuro exam has been normal on my previous examinations and per chart review  -MRI brain negative for acute infarct  Paroxysmal atrial fibrillation on warfarin -Patient currently in sinus rhythm with first-degree AV block -Pharmacy consulted for warfarin dosing  -Cardiac monitoring   HTN -Hold norvasc, metoprolol in light of orthostasis   History of CVA (cerebrovascular accident)/Asymptomatic stenosis of right carotid artery s/p right CEA 03/10/16 -CTA  Head and neck on 11/10 with patent right carotid endarterecomy site  -Vascular sx evaluated patient on 11/10-11/11: Operative site with hematoma, monitor site. Follow up with vascular surgery outpatient.  -Monitor hematoma site. Stable   Constipation -Colace 100 mg twice a day -MiraLAX 17 g daily  Mitral stenosis/Mildpulmonary hypertension -Echocardiogram in August 2017 showed normal ejection fraction with severe diffuse thickening and calcification of aortic valve, prior mitral valvuloplasty with severely thickened mitral valve leaflets and associated moderate mitral stenosis-which has progressed since 2016, and pulmonary hypertension was characterized as mild with associated increase in right ventricular systolic pressure  -Repeat echo to rule out cardiac etiology   HLD  -Continue Lipitor   DVT prophylaxis: coumadin Code Status: full Family Communication: daughter at bedside  Disposition Plan: pending further work up, back home    Consultants:   Neurology  Cardiology   Procedures:   EEG 11/13   Antimicrobials:   None  Subjective: Patient  feeling well today. She denies any dizziness with ambulation today. This is an improvement from previous. She denies any chest pain, shortness of breath, nausea or vomiting. Tolerating diet well.  Objective: Vitals:   03/22/16 0800 03/22/16 2123 03/23/16 0546 03/23/16 0831  BP: 128/76 (!) 119/52 (!) 122/55 120/64  Pulse: 80 84 87 92  Resp: 16 18 (!) 22 20  Temp: 98.4 F (36.9 C) 99.6 F (37.6 C) 98.1 F (36.7 C) 97.9 F (36.6 C)  TempSrc: Oral Oral Oral Oral  SpO2: 100% 100% 99% 100%  Weight:      Height:       No intake or output data in the 24 hours ending 03/23/16 1010 Filed Weights   03/21/16 1504  Weight: 72.8 kg (160 lb 6.4 oz)    Examination:  General exam: Appears calm and comfortable  Respiratory system: Clear to auscultation. Respiratory effort normal. Cardiovascular system: S1 & S2 heard, RRR. No JVD, murmurs, rubs, gallops or clicks. No pedal edema. Gastrointestinal system: Abdomen is nondistended, soft and nontender. No organomegaly or masses felt. Normal bowel sounds heard. Central nervous system: Alert and oriented. Tongue deviation to the right. This is a new exam finding since admission. No other focal deficits Extremities: Symmetric 5 x 5 power. Skin: No rashes, lesions or ulcers Psychiatry: Judgement and insight appear normal. Mood & affect appropriate.   Data Reviewed: I have personally reviewed following labs and imaging studies  CBC:  Recent Labs Lab 03/19/16 0318 03/21/16 0641 03/22/16 0905  WBC 5.6 6.5 4.4  NEUTROABS 3.4  --   --   HGB 11.9* 11.5* 12.3  HCT 37.6 36.9 39.5  MCV 84.3 84.4 84.6  PLT 183 237 741   Basic Metabolic Panel:  Recent Labs Lab 03/19/16 0318 03/19/16 0420 03/19/16 1028 03/20/16 0445 03/21/16 0641 03/22/16 0905  NA 138  --   --  139 137 139  K 3.1*  --   --  5.3* 3.9 3.7  CL 103  --   --  107 104 104  CO2 22  --   --  24 22 25   GLUCOSE 143*  --   --  93 101* 103*  BUN 12  --   --  13 16 15   CREATININE 0.95   --   --  0.75 0.82 0.83  CALCIUM 9.7  --   --  9.6 9.7 9.6  MG  --  2.0  --   --   --   --   PHOS  --   --  3.7  --   --   --    GFR: Estimated Creatinine Clearance: 64 mL/min (by C-G formula based on SCr of 0.83 mg/dL). Liver Function Tests:  Recent Labs Lab 03/19/16 0318  AST 28  ALT 24  ALKPHOS 75  BILITOT 1.2  PROT 8.5*  ALBUMIN 3.7   No results for input(s): LIPASE, AMYLASE in the last 168 hours. No results for input(s): AMMONIA in the last 168 hours. Coagulation Profile:  Recent Labs Lab 03/19/16 0318 03/20/16 0445 03/21/16 0641 03/22/16 0905 03/23/16 0523  INR 3.92 3.93 2.26 1.85 1.95   Cardiac Enzymes: No results for input(s): CKTOTAL, CKMB, CKMBINDEX, TROPONINI in the last 168 hours. BNP (last 3 results)  Recent Labs  10/10/15 1343 01/20/16 1539  PROBNP 238.0* 136.0*   HbA1C: No results for input(s): HGBA1C in the last 72 hours. CBG:  Recent Labs Lab 03/21/16 0707  GLUCAP 89   Lipid Profile: No  results for input(s): CHOL, HDL, LDLCALC, TRIG, CHOLHDL, LDLDIRECT in the last 72 hours. Thyroid Function Tests: No results for input(s): TSH, T4TOTAL, FREET4, T3FREE, THYROIDAB in the last 72 hours. Anemia Panel: No results for input(s): VITAMINB12, FOLATE, FERRITIN, TIBC, IRON, RETICCTPCT in the last 72 hours. Sepsis Labs: No results for input(s): PROCALCITON, LATICACIDVEN in the last 168 hours.  No results found for this or any previous visit (from the past 240 hour(s)).     Radiology Studies: Mr Brain Wo Contrast  Result Date: 03/22/2016 CLINICAL DATA:  New tongue deviation to the right. Prior left MCA stroke. Multiple recent syncopal episodes. EXAM: MRI HEAD WITHOUT CONTRAST TECHNIQUE: Multiplanar, multiecho pulse sequences of the brain and surrounding structures were obtained without intravenous contrast. COMPARISON:  Head CT 03/21/2016 and MRI 04/06/2013 FINDINGS: Brain: There is no evidence of acute infarct, mass, midline shift, or  extra-axial fluid collection. A chronic left MCA infarct is again seen per my early involving the parietal lobe with a small amount of chronic blood products. There is ex vacuo dilatation of the left lateral ventricle. Small focus of cortical encephalomalacia at the level of the posterior right insula is unchanged. Small foci of scattered T2 hyperintensity in both cerebral hemispheres are similar to the prior MRI and nonspecific but compatible with mild chronic small vessel ischemic disease. Small chronic infarcts are again seen in the basal ganglia bilaterally and left cerebellum. Vascular: Major intracranial vascular flow voids are preserved. Skull and upper cervical spine: Skull hyperostosis. No suspicious osseous lesion. Sinuses/Orbits: Unremarkable orbits. Trace left mastoid effusion. No significant paranasal sinus disease. Other: None. IMPRESSION: 1. No acute intracranial abnormality. 2. Chronic left MCA infarct. 3. Chronic small vessel ischemic changes as above. Electronically Signed   By: Logan Bores M.D.   On: 03/22/2016 13:02      Scheduled Meds: . atorvastatin  80 mg Oral q1800  . docusate sodium  100 mg Oral BID  . polyethylene glycol  17 g Oral Daily  . Warfarin - Pharmacist Dosing Inpatient   Does not apply q1800   Continuous Infusions:   LOS: 0 days    Time spent: 30 minutes   Dessa Phi, DO [facility] [email or web address] Password [email or web address] 03/23/2016, 10:10 AM

## 2016-03-23 NOTE — Progress Notes (Signed)
ANTICOAGULATION CONSULT NOTE - Follow up [facility] for Warfarin to Heparin Indication: atrial fibrillation  Vital Signs: Temp: 98.1 F (36.7 C) (11/14 1054) Temp Source: Oral (11/14 1054) BP: 99/63 (11/14 1054) Pulse Rate: 109 (11/14 1054)  Labs:  Recent Labs  03/21/16 0641 03/22/16 0905 03/23/16 0523  HGB 11.5* 12.3  --   HCT 36.9 39.5  --   PLT 237 243  --   LABPROT 25.3* 21.6* 22.5*  INR 2.26 1.85 1.95  CREATININE 0.82 0.83  --     Estimated Creatinine Clearance: 64 mL/min (by C-G formula based on SCr of 0.83 mg/dL).  Assessment: 65 yo F re-admitted on 11/12 with syncope/LOC (admitted 11/10-11/11 for same). PTA warfarin for history of afib.   Now transitioning to heparin in preparation for cath, Coumadin now on hold  Goal of Therapy:  Heparin level = 0.3-0.7 Monitor platelets by anticoagulation protocol: Yes   Plan:  Heparin at 1000 units / hr 6 hour heparin level Daily heparin level, CBC  Thank you Anette Guarneri, PharmD [phone]  5:08 PM, 03/23/2016

## 2016-03-23 NOTE — Consult Note (Signed)
Patient ID: Marie Malone MRN: [id number], DOB/AGE: 1951/03/26   Admit date: 03/21/2016   Reason for Consult: Syncope Requesting MD: Dr. Aggie Moats, Internal Medicine   Primary Physician: [name], NP Primary Cardiologist: [name] Electrophysiologist: [name]   Pt. Profile:  66 y/o AAF with h/o mitral stenosis s/p valvuloplasty at [facility] in 2008, normal cath in 2008, atrial fibrillation, CVA now on anticoagulation with Coumadin, carotid artery disease s/p recent right carotid  endarterectomy 03/10/16, per [name], admitted for syncope/ orthostatic hypotension.   Problem List  Past Medical History:  Diagnosis Date  . Atrial fibrillation ([facility])   . Complication of anesthesia    difficult to wake up from anesthesia  . Heart murmur   . History of rheumatic fever    as a child  . Hyperlipidemia   . Hypertension   . Mitral stenosis   . OSA (obstructive sleep apnea)    mild per sleep study 2008  . Pneumonia    double pneumonia once  . Stroke [facility])     Past Surgical History:  Procedure Laterality Date  . APPENDECTOMY  1989   appendix ruptured,had peritonitis  . CARDIAC SURGERY  2008   "balloon surgery at [facility]"  . COLONOSCOPY W/ POLYPECTOMY    . ENDARTERECTOMY Right 03/10/2016   Procedure: ENDARTERECTOMY CAROTID RIGHT;  Surgeon: Serafina Mitchell, MD;  Location: [place];  Service: Vascular;  Laterality: Right;  . EP IMPLANTABLE DEVICE N/A 03/11/2016   Procedure: Loop Recorder Removal;  Surgeon: Deboraha Sprang, MD;  Location: [place] CV LAB;  Service: Cardiovascular;  Laterality: N/A;  . 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 LOOP RECORDER IMPLANT N/A 04/10/2013   Procedure: LOOP RECORDER IMPLANT;  Surgeon: Coralyn Mark, MD;  Location: [place] CATH LAB;  Service: Cardiovascular;  Laterality: N/A;  . PATCH ANGIOPLASTY Right 03/10/2016   Procedure: PATCH ANGIOPLASTY CAROTID RIGHT USING Rueben Bash BIOLOGIC PATCH;   Surgeon: Serafina Mitchell, MD;  Location: [place];  Service: Vascular;  Laterality: Right;  . TEE WITHOUT CARDIOVERSION N/A 04/10/2013   Procedure: TRANSESOPHAGEAL ECHOCARDIOGRAM (TEE);  Surgeon: [name], MD;  Location: [facility] ENDOSCOPY;  Service: Cardiovascular;  Laterality: N/A;  . TUBAL LIGATION       Allergies  Allergies  Allergen Reactions  . No Known Allergies     HPI  Marie Malone is a 65 y/o AA female, admitted for syncope. She is followed by [name] and [name]. She has a h/o mitral stenosis and atrial fibrillation. She has a history of rheumatic fever. In June 2008, she had a cardiac catheterization in [facility] that showed normal LV function, no coronary disease, moderate mitral stenosis with a valve area of 1.1 cm, and severe pulmonary hypertension. Subsequent TEE in [facility] in 2008 showed normal LV function and moderate mitral stenosis with a valve area of 1.4 cm, mild MR. As a result, patient had valvuloplasty at [facility] in 2008. Nuclear study in April of 2014 showed an ejection fraction of 56%. There was a fixed anterior defect consistent with soft tissue attenuation but no ischemia. Abdominal ultrasound in April of 2014 was normal. Patient was later admitted 11/14 with a CVA. She underwent transesophageal echocardiogram 12/14 which showed normal LV function. There was mild mitral stenosis and mild mitral regurgitation. The left atrium was dilated and there was spontaneous contrast in the left atrial appendage but no thrombus. There was mild spontaneous  contrast in the right atrium. There was a small oscillating density on the anterior mitral valve leaflet of uncertain etiology and vegetation could not be excluded. There was a small oscillating density on the aortic valve felt most likely to be Lambl's excrescence. Patient was treated for SBE; blood cultures negative. Patient did have an implantable loop placed prior to discharge. This ultimately revealed paroxysmal atrial  fibrillation and anticoagulation was initiated. She was started on coumadin given history of valvular heart disease. She had carotid dopplers 8/17 which showed 60-79% right and 1-39% left ICA stenosis. On 03/10/16, she underwent right carotid endarterectomy per [name]. Her most recent echo 8/17 showed normal LV function; moderate MS (mean gradient 8 mmHg), moderate MR, severe LAE.  She recently presented to the [facility] ED on 03/19/16 with a complaint of AMS / staring spell. W/u was unrevealing. CTA of head and neck on 11/10 with patent right carotid endarterecomy site. No abnormalities.  MRI of brain was negative for acute infarct. She was sent home the next day on 11/11. However, she presented back on 11/12 with complaint of recurrent spells. This time with syncope. She got up to get a drink of water and the next thing she can recall, she had fell. She was on the floor. She believes she passed out for ~ 5 min. No chest pain or dyspnea.   In ED, she was felt to be orthostatic (drop in BP from 152/61 >> 118/62) and treated with IVFs. Neurology was consulted. EEG demonstrates normal awake and sleep findings with no epileptiform activity. No further neurological w/u indicated. Cardiology consulted for cardiac assessment. 2D echo pending. EKG shows SR. Telemetry in the past 12 hrs shows a 3 beat run of NSVT and 1 missed beat. Otherwise NSR. No atrial fibrillation. Unfortunately, her loop recorder was recently extracted. She is on coumadin, however INR is currently subtherapeutic at 1.95. She denies any melena/ hematochezia. She notes regular PO intake. Her antihypertensives are on hold, which include amlodipine and metoprolol. BP w/o medication is soft and stable. No recurrent syncope/ near syncope since admission.   Home Medications  Prior to Admission medications   Medication Sig Start Date End Date Taking? Authorizing Provider  warfarin (COUMADIN) 5 MG tablet Take 5 mg by mouth daily. 5 mg daily except 7.5  mg on Wednesdays   Yes Historical Provider, MD  acetaminophen (TYLENOL) 325 MG tablet Take 650 mg by mouth every 6 (six) hours as needed for mild pain.     Historical Provider, MD  amLODipine (NORVASC) 10 MG tablet TAKE ONE TABLET BY MOUTH ONCE DAILY 02/04/16   Debbrah Alar, NP  atorvastatin (LIPITOR) 80 MG tablet TAKE ONE TABLET BY MOUTH ONCE DAILY AT 6 PM 03/15/16   Debbrah Alar, NP  docusate sodium (COLACE) 100 MG capsule Take 1 capsule (100 mg total) by mouth 2 (two) times daily. 03/20/16   Jennifer Chahn-Yang Choi, DO  metoprolol succinate (TOPROL-XL) 25 MG 24 hr tablet Take 1 tablet (25 mg total) by mouth daily. 03/15/16   Debbrah Alar, NP  oxyCODONE-acetaminophen (PERCOCET/ROXICET) 5-325 MG tablet Take 1-2 tablets by mouth every 4 (four) hours as needed for moderate pain. 03/11/16   Alvia Grove, PA-C  polyethylene glycol (MIRALAX / GLYCOLAX) packet Take 17 g by mouth daily. 03/20/16   [facility] Meds  . atorvastatin  80 mg Oral q1800  . docusate sodium  100 mg Oral BID  . polyethylene glycol  17 g Oral  Daily  . warfarin  5 mg Oral ONCE-1800  . Warfarin - Pharmacist Dosing Inpatient   Does not apply q1800    Family History  Family History  Problem Relation Age of Onset  . Diabetes Mother   . Stroke Mother   . Diabetes Father   . Heart disease Father     CHF  . Cancer Father     kidney  . Diabetes Sister   . Diabetes Brother   . Hypertension Daughter     Social History  Social History   Social History  . Marital status: Married    Spouse name: N/A  . Number of children: 2  . Years of education: Associates   Occupational History  . Not on file.   Social History Main Topics  . Smoking status: Former Smoker    Packs/day: 1.00    Years: 5.00    Types: Cigarettes    Start date: 07/15/1970    Quit date: 05/11/1975  . Smokeless tobacco: Never Used     Comment: quit smoking 36 years ago  . Alcohol use No     Comment:  drank years ago when young  . Drug use: No  . Sexual activity: Not on file   Other Topics Concern  . Not on file   Social History Narrative   Retired Archivist   Married   She has 2 grown children-   Daughter lives in [place]   Son lives in [place]   6 grandchildren         Review of Systems General:  No chills, fever, night sweats or weight changes.  Cardiovascular:  No chest pain, dyspnea on exertion, edema, orthopnea, palpitations, paroxysmal nocturnal dyspnea. Dermatological: No rash, lesions/masses Respiratory: No cough, dyspnea Urologic: No hematuria, dysuria Abdominal:   No nausea, vomiting, diarrhea, bright red blood per rectum, melena, or hematemesis Neurologic:  No visual changes, wkns, changes in mental status. All other systems reviewed and are otherwise negative except as noted above.  Physical Exam  Blood pressure 99/63, pulse (!) 109, temperature 98.1 F (36.7 C), temperature source Oral, resp. rate 16, height 5\' 2"  (1.575 m), weight 160 lb 6.4 oz (72.8 kg), SpO2 100 %.  General: Pleasant, NAD Psych: Normal affect. Neuro: Alert and oriented X 3. Moves all extremities spontaneously. HEENT: Normal  Neck: Supple without bruits or JVD. Right endarterectomy scar Lungs:  Resp regular and unlabored, CTA. Heart: RRR no s3, s4, or murmurs. Abdomen: Soft, non-tender, non-distended, BS + x 4.  Extremities: No clubbing, cyanosis or edema. DP/PT/Radials 2+ and equal bilaterally.  Labs  Troponin (Point of Care Test) No results for input(s): TROPIPOC in the last 72 hours. No results for input(s): CKTOTAL, CKMB, TROPONINI in the last 72 hours. Lab Results  Component Value Date   WBC 4.4 03/22/2016   HGB 12.3 03/22/2016   HCT 39.5 03/22/2016   MCV 84.6 03/22/2016   PLT 243 03/22/2016    Recent Labs Lab 03/19/16 0318  03/22/16 0905  NA 138  < > 139  K 3.1*  < > 3.7  CL 103  < > 104  CO2 22  < > 25  BUN 12  < > 15  CREATININE 0.95  < > 0.83    CALCIUM 9.7  < > 9.6  PROT 8.5*  --   --   BILITOT 1.2  --   --   ALKPHOS 75  --   --   ALT 24  --   --  AST 28  --   --   GLUCOSE 143*  < > 103*  < > = values in this interval not displayed. Lab Results  Component Value Date   CHOL 164 07/22/2015   HDL 68.90 07/22/2015   LDLCALC 77 07/22/2015   TRIG 91.0 07/22/2015   Lab Results  Component Value Date   DDIMER 0.86 (H) 04/14/2013     Radiology/Studies  Ct Angio Head W Or Wo Contrast  Result Date: 03/19/2016 CLINICAL DATA:  Acute onset mental status change. EXAM: CT ANGIOGRAPHY HEAD AND NECK TECHNIQUE: Multidetector CT imaging of the head and neck was performed using the standard protocol during bolus administration of intravenous contrast. Multiplanar CT image reconstructions and MIPs were obtained to evaluate the vascular anatomy. Carotid stenosis measurements (when applicable) are obtained utilizing NASCET criteria, using the distal internal carotid diameter as the denominator. CONTRAST:  50 mL Isovue 370 IV COMPARISON:  CTA head and neck 01/29/2016 FINDINGS: CT HEAD FINDINGS Brain: There is an old left cerebellar infarct. There is left MCA distribution encephalomalacia at the site of prior infarct. No CT evidence of acute cortical infarct. No acute hemorrhage. No midline shift or mass effect. No extra-axial collection. Vascular: No hyperdense vessel or unexpected calcification. Skull: Normal. Negative for fracture or focal lesion. Sinuses: Imaged portions are clear. Orbits: Normal. Review of the MIP images confirms the above findings CTA NECK FINDINGS Aortic arch: There is atherosclerotic calcification within the aortic arch. There is a normal branching pattern. The subclavian arteries are normal proximally. There is atherosclerotic calcification within the proximal arch vessels. Right carotid system: There is mild narrowing of the right common carotid artery at the level of the thyroid secondary to mass effect from adjacent soft  tissue. Otherwise, no hemodynamically significant stenosis. The right internal carotid artery is normal. Left carotid system: There is mild atherosclerotic calcification of the left carotid bifurcation without hemodynamically significant stenosis. Vertebral arteries: There is narrowing of the right vertebral artery origin secondary to atherosclerotic calcification. There is mild calcification of the left vertebral artery origin without significant stenosis. The vertebral system is right dominant. The V2, V3 and V4 segments of the vertebral arteries are normal to the confluence with the basilar artery. Skeleton: There is no bony spinal canal stenosis. No lytic or blastic lesions. Other neck: There is marked soft tissue prominence within the right carotid space, which is likely secondary to recent right carotid endarterectomy performed March 10, 2016. Upper chest: There is a ground-glass opacity measuring 4 mm in the left upper lobe. Review of the MIP images confirms the above findings CTA HEAD FINDINGS Anterior circulation: --Intracranial internal carotid arteries: Mild calcification of the internal carotid arteries at the skullbase. --Anterior cerebral arteries: Normal. --Middle cerebral arteries: Normal. --Posterior communicating arteries: Absent bilaterally. Posterior circulation: --Posterior cerebral arteries: Normal. --Superior cerebellar arteries: Normal. --Basilar artery: Normal. --Anterior inferior cerebellar arteries: Not visualized, which is not uncommon. --Posterior inferior cerebellar arteries: Normal. Venous sinuses: As permitted by contrast timing, patent. Anatomic variants: None. Delayed phase: No abnormal parenchymal enhancement. Review of the MIP images confirms the above findings IMPRESSION: 1. No acute intracranial abnormality. 2. 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 3. Normal CTA of the circle of Willis and intracranial arteries. 4. Status post right carotid endarterectomy  with greatly improved patency. 5. No hemodynamically significant stenosis of the left carotid system. Electronically Signed   By: Ulyses Jarred M.D.   On: 03/19/2016 06:27   Dg Abd 1 View  Result  Date: 03/19/2016 CLINICAL DATA:  Constipation. EXAM: ABDOMEN - 1 VIEW COMPARISON:  No recent prior. FINDINGS: Soft tissue structures are unremarkable. Ill-defined radiopacity is noted stomach, this may represent debris within the stomach. Stool noted throughout colon suggests constipation. No bowel distention. No free air. Degenerative changes lumbar spine and both hips. Aortoiliac atherosclerotic vascular calcification. Surgical clips sutures in the pelvis. IMPRESSION: No acute abnormality identified. Prominent amount of stool in the colon suggesting constipation. Electronically Signed   By: Marcello Moores  Register   On: 03/19/2016 09:56   Ct Head Wo Contrast  Result Date: 03/21/2016 CLINICAL DATA:  Frontal and right-sided headache after dizziness and fall. Right-sided neck pain. Carotid endarterectomy March 10, 2016 EXAM: CT HEAD WITHOUT CONTRAST CT CERVICAL SPINE WITHOUT CONTRAST TECHNIQUE: Multidetector CT imaging of the head and cervical spine was performed following the standard protocol without intravenous contrast. Multiplanar CT image reconstructions of the cervical spine were also generated. COMPARISON:  March 19, 2016 CT scan FINDINGS: CT HEAD FINDINGS Brain: No subdural, epidural, or subarachnoid hemorrhage. Small infarct in the left cerebellar hemisphere, unchanged. Left parietal infarct with encephalomalacia, unchanged. No acute cortical ischemia or infarct. Ventricles and sulci are stable. Brainstem and basal cisterns are normal. No mass, mass effect, or midline shift. Vascular: Calcified atherosclerosis in the intracranial portions of the carotid arteries. Skull: Normal. Negative for fracture or focal lesion. Sinuses/Orbits: No acute finding. Other: None. CT CERVICAL SPINE FINDINGS Alignment:  Straightening of normal lordosis with no traumatic malalignment. Skull base and vertebrae: No fractures. Soft tissues and spinal canal: Hematoma in the right side of the neck from recent right carotid endarterectomy exerts mass effect on the right thyroid lobe. The CTA of the neck from March 19, 2016 demonstrated mild narrowing of the carotid artery as it passed posterior to the hematoma. The carotid artery is not well assessed in this region today. The hematoma is similar in size today. No other acute soft tissue abnormalities. Disc levels: Multilevel degenerative changes with anterior and posterior osteophytes. Partial calcification of the posterior longitudinal ligament. The posterior osteophytes are most prominent at T1-2 and T2-3 with narrowing of the spinal canal at these levels. At T1-2, the spinal canal measures 7 mm in maximum diameter. Upper chest: Negative. Other: No other abnormalities. IMPRESSION: 1. No acute intracranial process. Left cerebellar and left parietal infarcts, chronic. 2. No fracture or traumatic malalignment in the cervical spine. 3. Degenerative changes. Partial calcification of the posterior longitudinal ligament. Narrowing of the spinal canal due to these findings, most marked at T1-2 and T2-3 with an AP diameter of the spinal canal at T1-2 of 7 mm. 4. Hematoma in the right side of the neck from recent surgery. Electronically Signed   By: Dorise Bullion III M.D   On: 03/21/2016 09:50   Ct Angio Neck W And/or Wo Contrast  Result Date: 03/19/2016 CLINICAL DATA:  Acute onset mental status change. EXAM: CT ANGIOGRAPHY HEAD AND NECK TECHNIQUE: Multidetector CT imaging of the head and neck was performed using the standard protocol during bolus administration of intravenous contrast. Multiplanar CT image reconstructions and MIPs were obtained to evaluate the vascular anatomy. Carotid stenosis measurements (when applicable) are obtained utilizing NASCET criteria, using the distal  internal carotid diameter as the denominator. CONTRAST:  50 mL Isovue 370 IV COMPARISON:  CTA head and neck 01/29/2016 FINDINGS: CT HEAD FINDINGS Brain: There is an old left cerebellar infarct. There is left MCA distribution encephalomalacia at the site of prior infarct. No CT evidence of acute cortical  infarct. No acute hemorrhage. No midline shift or mass effect. No extra-axial collection. Vascular: No hyperdense vessel or unexpected calcification. Skull: Normal. Negative for fracture or focal lesion. Sinuses: Imaged portions are clear. Orbits: Normal. Review of the MIP images confirms the above findings CTA NECK FINDINGS Aortic arch: There is atherosclerotic calcification within the aortic arch. There is a normal branching pattern. The subclavian arteries are normal proximally. There is atherosclerotic calcification within the proximal arch vessels. Right carotid system: There is mild narrowing of the right common carotid artery at the level of the thyroid secondary to mass effect from adjacent soft tissue. Otherwise, no hemodynamically significant stenosis. The right internal carotid artery is normal. Left carotid system: There is mild atherosclerotic calcification of the left carotid bifurcation without hemodynamically significant stenosis. Vertebral arteries: There is narrowing of the right vertebral artery origin secondary to atherosclerotic calcification. There is mild calcification of the left vertebral artery origin without significant stenosis. The vertebral system is right dominant. The V2, V3 and V4 segments of the vertebral arteries are normal to the confluence with the basilar artery. Skeleton: There is no bony spinal canal stenosis. No lytic or blastic lesions. Other neck: There is marked soft tissue prominence within the right carotid space, which is likely secondary to recent right carotid endarterectomy performed March 10, 2016. Upper chest: There is a ground-glass opacity measuring 4 mm in the  left upper lobe. Review of the MIP images confirms the above findings CTA HEAD FINDINGS Anterior circulation: --Intracranial internal carotid arteries: Mild calcification of the internal carotid arteries at the skullbase. --Anterior cerebral arteries: Normal. --Middle cerebral arteries: Normal. --Posterior communicating arteries: Absent bilaterally. Posterior circulation: --Posterior cerebral arteries: Normal. --Superior cerebellar arteries: Normal. --Basilar artery: Normal. --Anterior inferior cerebellar arteries: Not visualized, which is not uncommon. --Posterior inferior cerebellar arteries: Normal. Venous sinuses: As permitted by contrast timing, patent. Anatomic variants: None. Delayed phase: No abnormal parenchymal enhancement. Review of the MIP images confirms the above findings IMPRESSION: 1. No acute intracranial abnormality. 2. 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 3. Normal CTA of the circle of Willis and intracranial arteries. 4. Status post right carotid endarterectomy with greatly improved patency. 5. No hemodynamically significant stenosis of the left carotid system. Electronically Signed   By: Ulyses Jarred M.D.   On: 03/19/2016 06:27   Ct Cervical Spine Wo Contrast  Result Date: 03/21/2016 CLINICAL DATA:  Frontal and right-sided headache after dizziness and fall. Right-sided neck pain. Carotid endarterectomy March 10, 2016 EXAM: CT HEAD WITHOUT CONTRAST CT CERVICAL SPINE WITHOUT CONTRAST TECHNIQUE: Multidetector CT imaging of the head and cervical spine was performed following the standard protocol without intravenous contrast. Multiplanar CT image reconstructions of the cervical spine were also generated. COMPARISON:  March 19, 2016 CT scan FINDINGS: CT HEAD FINDINGS Brain: No subdural, epidural, or subarachnoid hemorrhage. Small infarct in the left cerebellar hemisphere, unchanged. Left parietal infarct with encephalomalacia, unchanged. No acute cortical ischemia or  infarct. Ventricles and sulci are stable. Brainstem and basal cisterns are normal. No mass, mass effect, or midline shift. Vascular: Calcified atherosclerosis in the intracranial portions of the carotid arteries. Skull: Normal. Negative for fracture or focal lesion. Sinuses/Orbits: No acute finding. Other: None. CT CERVICAL SPINE FINDINGS Alignment: Straightening of normal lordosis with no traumatic malalignment. Skull base and vertebrae: No fractures. Soft tissues and spinal canal: Hematoma in the right side of the neck from recent right carotid endarterectomy exerts mass effect on the right thyroid lobe. The CTA of the neck  from March 19, 2016 demonstrated mild narrowing of the carotid artery as it passed posterior to the hematoma. The carotid artery is not well assessed in this region today. The hematoma is similar in size today. No other acute soft tissue abnormalities. Disc levels: Multilevel degenerative changes with anterior and posterior osteophytes. Partial calcification of the posterior longitudinal ligament. The posterior osteophytes are most prominent at T1-2 and T2-3 with narrowing of the spinal canal at these levels. At T1-2, the spinal canal measures 7 mm in maximum diameter. Upper chest: Negative. Other: No other abnormalities. IMPRESSION: 1. No acute intracranial process. Left cerebellar and left parietal infarcts, chronic. 2. No fracture or traumatic malalignment in the cervical spine. 3. Degenerative changes. Partial calcification of the posterior longitudinal ligament. Narrowing of the spinal canal due to these findings, most marked at T1-2 and T2-3 with an AP diameter of the spinal canal at T1-2 of 7 mm. 4. Hematoma in the right side of the neck from recent surgery. Electronically Signed   By: Dorise Bullion III M.D   On: 03/21/2016 09:50   Mr Brain Wo Contrast  Result Date: 03/22/2016 CLINICAL DATA:  New tongue deviation to the right. Prior left MCA stroke. Multiple recent syncopal  episodes. EXAM: MRI HEAD WITHOUT CONTRAST TECHNIQUE: Multiplanar, multiecho pulse sequences of the brain and surrounding structures were obtained without intravenous contrast. COMPARISON:  Head CT 03/21/2016 and MRI 04/06/2013 FINDINGS: Brain: There is no evidence of acute infarct, mass, midline shift, or extra-axial fluid collection. A chronic left MCA infarct is again seen per my early involving the parietal lobe with a small amount of chronic blood products. There is ex vacuo dilatation of the left lateral ventricle. Small focus of cortical encephalomalacia at the level of the posterior right insula is unchanged. Small foci of scattered T2 hyperintensity in both cerebral hemispheres are similar to the prior MRI and nonspecific but compatible with mild chronic small vessel ischemic disease. Small chronic infarcts are again seen in the basal ganglia bilaterally and left cerebellum. Vascular: Major intracranial vascular flow voids are preserved. Skull and upper cervical spine: Skull hyperostosis. No suspicious osseous lesion. Sinuses/Orbits: Unremarkable orbits. Trace left mastoid effusion. No significant paranasal sinus disease. Other: None. IMPRESSION: 1. No acute intracranial abnormality. 2. Chronic left MCA infarct. 3. Chronic small vessel ischemic changes as above. Electronically Signed   By: Logan Bores M.D.   On: 03/22/2016 13:02    ECG  NSR. No ischemia.   Echocardiogram pending     ASSESSMENT AND PLAN  Principal Problem:   Transient alteration of awareness Active Problems:   HTN (hypertension)   Atrial fibrillation (HCC)   HLD (hyperlipidemia)   Asymptomatic stenosis of right carotid artery s/p right CEA 03/10/16   Constipation   1. Syncope: w/u + for orthostatic hypotension with drop in BP from 152/61>>118/62 after position change. She received IVFs and her home amlodipine and metoprolol are both on hold. BP remains soft but stable in the 38H systolic. No significant anemia. Hgb is  stable at 12.3. She does not appear dehydrated. Neurology was consulted and evaluated patient. EEG normal. Neuro believes episodes to be more consistent with orthostasis. Neurology has signed off. She has a h/o carotid artery disease with carotid dopplers 8/17 showing 60-79% right sided stenosis and and 1-39%  left stenosis. She is s/p recent right carotid endarterectomy 03/10/16, per [name].  CTA of head and neck on 11/10 with patent right carotid endarterecomy site. She had a normal cath in 2008 and  mitral valvuloplasty for MS at [facility] in 2008. She denies any recent CP. No dyspnea. 2D echo is pending. We will assess LVF and status of mitral and other cardiac valves. Telemetry reviewed. Telemetry in the past 12 hrs shows a 3 beat run of NSVT and 1 missed beat. Otherwise NSR. No atrial fibrillation. Unfortunately, her loop recorder was recently extracted, thus we have no means to assess if there were any rhythm disturbances that correlate with the timing of her events. However, based on current findings, also agree that her syncope is likely related to orthostatic hypotension. Recommend discontinuation of her amlodipine for now. Given her MS and h/o atrial fibrillation, we recommend that she continue her BB. Repeat orthostatics to see if any improvement off of amlodipine. If still orthostatic, can consider addition of compression stockings or midodrine. We will also arrange for TEE tomorrow to better assess her mitral valve and  R/LHC on 03/25/16. INR is 1.95. Hold coumadin tonight. Start IV heparin. MD to assess and will provide further recommendations.     Signed, Lyda Jester, PA-C 03/23/2016, 3:49 PM  I have seen and examined the patient along with Lyda Jester, PA-C.  I have reviewed the chart, notes and new data.  I agree with PA's note.  Key new complaints: syncope with prodrome suggestive of hypotension (flushing weakness for a few minutes), but she has been feeling poorly ever since  before CEA ("like I did before my stroke"). Key examination changes: RRR, early and loud opening snap, very faint apical rumble Key new findings / data: reviewed echo images. Appears to have moderate to severe MS with mean gradient 9 at HR 80. There is ventricular septal flattening and the sPAP may be underestimated.  INR 1.95  PLAN: History sounds most suggestive of hypotensive syncope, but she felt this way "before her stroke" (could she be having afib with RVR and syncope due to MS?). Symptoms recurred evena fter stopping amlodipine. I think we need to reevaluate the MV. Plan TEE tomorrow and possibly R and L heart cath the day after. Meanwhile keep on monitor and continue beta blocker for MS. Hold warfarin in anticipation of possible cath and start IV heparin since her embolic risk off warfarin is high (rheumatic MS with prior CVA).   [name], MD, [place] [phone] 03/23/2016, 5:05 PM

## 2016-03-23 NOTE — Evaluation (Signed)
Occupational Therapy Evaluation and Discharge Summary Patient Details Name: Marie Malone MRN: [id number] DOB: 1950/10/09 Today's Date: 03/23/2016    History of Present Illness Pt is a 65 yo female admitted after an episode of unresponsiveness at home but did not lose consiousness.  Pt had a CEA on 03/10/16 and since then has now been admitted twice for these episodes.  EEG is normal and ECHO is pending.  MD seems to think it may be orthostatic.  Pt with h/o of mitral stenosis, pulmonary HTN and CVA.    Clinical Impression   Pt admitted with the above diagnosis and overall is functioning at baseline per pt and daughter. Pt concerned about these episodes continuing but is independent with her basic self care and is not in need of acute OT services at this time.     Follow Up Recommendations  No OT follow up;Supervision/Assistance - 24 hour    Equipment Recommendations  None recommended by OT    Recommendations for Other Services       Precautions / Restrictions Precautions Precautions: Fall Precaution Comments: only falls during these episodes of altered consiousness Restrictions Weight Bearing Restrictions: No Other Position/Activity Restrictions: watch BP      Mobility Bed Mobility               General bed mobility comments: pt in chair on arrival.  Transfers Overall transfer level: Modified independent Equipment used: None             General transfer comment: Supervision given at first due to recent episodes of orthostatic hypotention.    Balance Overall balance assessment: No apparent balance deficits (not formally assessed)                                          ADL Overall ADL's : At baseline                                       General ADL Comments: PT and daughter both feel she is at baseline with her adls.  The only time she has difficulty is during or right after these episodes.     Vision Vision  Assessment?: No apparent visual deficits   Perception     Praxis      Pertinent Vitals/Pain Pain Assessment: No/denies pain     Hand Dominance Left   Extremity/Trunk Assessment Upper Extremity Assessment Upper Extremity Assessment: RUE deficits/detail RUE Deficits / Details: WFL ROM and strength RUE Coordination: decreased fine motor   Lower Extremity Assessment Lower Extremity Assessment: Defer to PT evaluation   Cervical / Trunk Assessment Cervical / Trunk Assessment: Normal   Communication Communication Communication: No difficulties   Cognition Arousal/Alertness: Awake/alert Behavior During Therapy: WFL for tasks assessed/performed Overall Cognitive Status: History of cognitive impairments - at baseline       Memory: Decreased short-term memory             General Comments       Exercises       Shoulder Instructions      Home Living Family/patient expects to be discharged to:: Private residence Living Arrangements: Spouse/significant other Available Help at Discharge: Family;Available 24 hours/day Type of Home: House Home Access: Stairs to enter [employer] of Steps: 3 Entrance Stairs-Rails: None Home Layout: One  level;Other (Comment) (sunken in family room.)     Bathroom Shower/Tub: Walk-in shower;Door   [employer] Toilet: Handicapped height Bathroom Accessibility: Yes How Accessible: Accessible via walker Home Equipment: Cane - single point;Shower seat          Prior Functioning/Environment Level of Independence: Independent        Comments: Pt drives. Husband does not. Pt pays all bills. Both do cooking and cleaning.        OT Problem List:     OT Treatment/Interventions:      OT Goals(Current goals can be found in the care plan section) Acute Rehab OT Goals Patient Stated Goal: for this to stop happening OT Goal Formulation: All assessment and education complete, DC therapy  OT Frequency:     Barriers to D/C:             Co-evaluation              End of Session Nurse Communication: Mobility status  Activity Tolerance: Patient tolerated treatment well Patient left: in chair;with call bell/phone within reach;with family/visitor present   Time: 1205-1232 OT Time Calculation (min): 27 min Charges:  OT General Charges $OT Visit: 1 Procedure OT Evaluation $OT Eval Moderate Complexity: 1 Procedure OT Treatments $Self Care/Home Management : 8-22 mins G-Codes: OT G-codes **NOT FOR INPATIENT CLASS** Functional Assessment Tool Used: clinical judgement Functional Limitation: Self care Self Care Current Status (C1275): At least 1 percent but less than 20 percent impaired, limited or restricted Self Care Goal Status (T7001): At least 1 percent but less than 20 percent impaired, limited or restricted Self Care Discharge Status [phone]): At least 1 percent but less than 20 percent impaired, limited or restricted  Glenford Peers 03/23/2016, 12:41 PM [phone]

## 2016-03-23 NOTE — Progress Notes (Signed)
ANTICOAGULATION CONSULT NOTE - Follow up [place] for warfarin Indication: atrial fibrillation  Vital Signs: Temp: 98.1 F (36.7 C) (11/14 1054) Temp Source: Oral (11/14 1054) BP: 99/63 (11/14 1054) Pulse Rate: 109 (11/14 1054)  Labs:  Recent Labs  03/21/16 0641 03/22/16 0905 03/23/16 0523  HGB 11.5* 12.3  --   HCT 36.9 39.5  --   PLT 237 243  --   LABPROT 25.3* 21.6* 22.5*  INR 2.26 1.85 1.95  CREATININE 0.82 0.83  --     Estimated Creatinine Clearance: 64 mL/min (by C-G formula based on SCr of 0.83 mg/dL).   Medical History: Past Medical History:  Diagnosis Date  . Atrial fibrillation ([place])   . Complication of anesthesia    difficult to wake up from anesthesia  . Heart murmur   . History of rheumatic fever    as a child  . Hyperlipidemia   . Hypertension   . Mitral stenosis   . OSA (obstructive sleep apnea)    mild per sleep study 2008  . Pneumonia    double pneumonia once  . Stroke [facility])    Assessment: 65 yo F re-admitted on 11/12 with syncope/LOC (admitted 11/10-11/11 for same). PTA warfarin for history of afib. INR therapeutic on admission at 2.26. Last dose 11/9 - held during previous admission due to supratherapeutic INR.   Warfarin restarted 11/12. INR remains slightly subtherapeutic at 1.95. CBC stable.   PTA dose: 5 mg daily except 7.5 mg on Wednesdays. Warfarin was held due to supratherapeutic INR.   Goal of Therapy:  INR 2-3 Monitor platelets by anticoagulation protocol: Yes   Plan:  - Warfarin 5 mg tonight - Daily INR - Monitor CBC, s/sx of bleeding   Thank you for allowing [place] to participate in this patients care. Jens Som, PharmD Pager: [phone] 11:14 AM, 03/23/2016

## 2016-03-23 NOTE — Progress Notes (Signed)
Subjective: No further episodes while in the hospital. Patient feels well.  Exam: Vitals:   03/23/16 0546 03/23/16 0831  BP: (!) 122/55 120/64  Pulse: 87 92  Resp: (!) 22 20  Temp: 98.1 F (36.7 C) 97.9 F (36.6 C)        Gen: In bed, NAD MS: Alert and oriented follows all commands CN: Cranial nerves II through XII are grossly intact Motor: Moving all extremities well  Pertinent Labs/Diagnostics: EEG demonstrates normal awake and sleep findings with no epileptiform activity  Etta Quill PA-C [facility] [phone]  Impression: 65 year old female with multiple episodes of syncope with orthostatic signs vital signs compatible with significant orthostatic changes. EEG showing no epileptiform activity. At this time neurology will sign off    03/23/2016, 9:51 AM

## 2016-03-23 NOTE — Care Management Note (Addendum)
Case Management Note  Patient Details  Name: Marie Malone MRN: [id number] Date of Birth: [birth year]  Subjective/Objective:      Presented with AMS, hx of hypertension, dyslipidemia,  mitral stenosis and moderate to severe pulmonary hypertension, CVA, recent right carotid endarterectomy on 03/10/16, Atrialfibrillation,  aortic valvulotomy in 2008. From home with husband. States independent with ADL's, no DME usage.      Recently admitted to the hospital from 11/10-11/11 after a "staring" spell. Work up was non-revealing.   - s/p TEE 03/24/2016, plan R heart cath on 03/25/2016    Ja'Net Iiams (Daughter)      [phone]       PCP: Debbrah Alar  Action/Plan: Return to home when medically stable. CM to f/u with disposition needs.  Expected Discharge Date:                  Expected Discharge Plan:  Home/Self Care  In-House Referral:     Discharge planning Services  CM Consult  Post Acute Care Choice:    Choice offered to:     DME Arranged:    DME Agency:     HH Arranged:    HH Agency:     Status of Service:  In process, will continue to follow  If discussed at Long Length of Stay Meetings, dates discussed:    Additional Comments:  Sharin Mons, RN 03/23/2016, 7:58 PM

## 2016-03-24 ENCOUNTER — Encounter (HOSPITAL_COMMUNITY)
Admission: EM | Disposition: A | Discharge status: Home Health | Payer: Self-pay | Source: Home / Self Care | Attending: Internal Medicine

## 2016-03-24 ENCOUNTER — Inpatient Hospital Stay (HOSPITAL_COMMUNITY): Payer: BC Managed Care – PPO

## 2016-03-24 ENCOUNTER — Encounter (HOSPITAL_COMMUNITY): Payer: Self-pay | Admitting: *Deleted

## 2016-03-24 ENCOUNTER — Encounter: Payer: Self-pay | Admitting: Internal Medicine

## 2016-03-24 DIAGNOSIS — Z9889 Other specified postprocedural states: Secondary | ICD-10-CM

## 2016-03-24 DIAGNOSIS — K148 Other diseases of tongue: Secondary | ICD-10-CM

## 2016-03-24 DIAGNOSIS — I340 Nonrheumatic mitral (valve) insufficiency: Secondary | ICD-10-CM

## 2016-03-24 HISTORY — PX: TEE WITHOUT CARDIOVERSION: SHX5443

## 2016-03-24 LAB — HEPARIN LEVEL (UNFRACTIONATED)
HEPARIN UNFRACTIONATED: 0.68 [IU]/mL (ref 0.30–0.70)
HEPARIN UNFRACTIONATED: 0.79 [IU]/mL — AB (ref 0.30–0.70)
Heparin Unfractionated: 0.61 IU/mL (ref 0.30–0.70)
Heparin Unfractionated: 0.84 IU/mL — ABNORMAL HIGH (ref 0.30–0.70)

## 2016-03-24 LAB — CBC
HEMATOCRIT: 38.6 % (ref 36.0–46.0)
Hemoglobin: 12.1 g/dL (ref 12.0–15.0)
MCH: 26.7 pg (ref 26.0–34.0)
MCHC: 31.3 g/dL (ref 30.0–36.0)
MCV: 85 fL (ref 78.0–100.0)
PLATELETS: 172 10*3/uL (ref 150–400)
RBC: 4.54 MIL/uL (ref 3.87–5.11)
RDW: 16.1 % — AB (ref 11.5–15.5)
WBC: 6.5 10*3/uL (ref 4.0–10.5)

## 2016-03-24 LAB — PROTIME-INR
INR: 1.81
PROTHROMBIN TIME: 21.2 s — AB (ref 11.4–15.2)

## 2016-03-24 SURGERY — ECHOCARDIOGRAM, TRANSESOPHAGEAL
Anesthesia: Moderate Sedation

## 2016-03-24 MED ORDER — SODIUM CHLORIDE 0.9 % IV SOLN
INTRAVENOUS | Status: DC
  Administered 2016-03-24: 12:00:00 via INTRAVENOUS

## 2016-03-24 MED ORDER — MIDAZOLAM HCL 10 MG/2ML IJ SOLN
INTRAMUSCULAR | Status: DC | PRN
  Administered 2016-03-24 (×3): 1 mg via INTRAVENOUS
  Administered 2016-03-24: 2 mg via INTRAVENOUS

## 2016-03-24 MED ORDER — METOPROLOL TARTRATE 25 MG PO TABS: 25.0000 mg | ORAL_TABLET | Freq: Two times a day (BID) | ORAL | Status: DC

## 2016-03-24 MED ORDER — FENTANYL CITRATE (PF) 100 MCG/2ML IJ SOLN
INTRAMUSCULAR | Status: AC
  Filled 2016-03-24: qty 2

## 2016-03-24 MED ORDER — MIDAZOLAM HCL 5 MG/ML IJ SOLN
INTRAMUSCULAR | Status: AC
  Filled 2016-03-24: qty 2

## 2016-03-24 MED ORDER — FENTANYL CITRATE (PF) 100 MCG/2ML IJ SOLN
INTRAMUSCULAR | Status: DC | PRN
  Administered 2016-03-24: 12.5 ug via INTRAVENOUS
  Administered 2016-03-24: 25 ug via INTRAVENOUS
  Administered 2016-03-24: 12.5 ug via INTRAVENOUS

## 2016-03-24 MED ORDER — BUTAMBEN-TETRACAINE-BENZOCAINE 2-2-14 % EX AERO
INHALATION_SPRAY | CUTANEOUS | Status: DC | PRN
  Administered 2016-03-24: 2 via TOPICAL

## 2016-03-24 MED ORDER — METOPROLOL SUCCINATE ER 25 MG PO TB24
25.0000 mg | ORAL_TABLET | Freq: Every day | ORAL | Status: DC
  Administered 2016-03-24 – 2016-04-03 (×11): 25 mg via ORAL
  Filled 2016-03-24 (×12): qty 1

## 2016-03-24 MED ORDER — ACETAMINOPHEN 325 MG PO TABS
650.0000 mg | ORAL_TABLET | Freq: Four times a day (QID) | ORAL | Status: DC | PRN
  Administered 2016-03-24: 650 mg via ORAL
  Filled 2016-03-24: qty 2

## 2016-03-24 NOTE — Progress Notes (Signed)
PROGRESS NOTE        PATIENT DETAILS Name: Marie Malone Age: 65 y.o. Sex: female Date of Birth: 1950/09/12 Admit Date: 03/21/2016 Admitting Physician Shon Millet, DO PCP:O'SULLIVAN,MELISSA S., NP  Brief Narrative: Patient is a 65 y.o. female with history of recent R carotid endarterectomy 03/10/2016, Afib on coumadin, prior CVA, rheumatic mitral stenosis s/p valvuloplasty 2008, pulmonary HTN, dyslipidemia, HTN. Patient was admitted to the hospital 11/10-11/11 after a "staring" spell and was discharged with a negative work-up and symptom resolution. She presented again to the ED 11/12 after 2 episodes of syncope upon standing the night before. She was admitted with a working diagnosis of orthostatic hypotension. Neurology and Cardiology consulted.  Subjective: Patient is laying comfortably in bed and feeling well today. She would like to have her scheduled procedures performed soon so she can eat and drink.   Denies chest pain, SOB, cough, wheeze, nausea, vomiting.  Assessment/Plan: Altered mentation / syncope / orthostasis vs seizure: prior admission with negative work-up for orthostatic etiology. Symptoms resolved with IVF, discharged home. Re-admitted with 2 syncopal episodes at home with altered mental status but no loss of consciousness. Neurology believes these episodes to be more consistent with orthostasis than seizure. EEG normal. Orthostatic positive 152/61 -> 118/62 with dizziness. Amlodipine and metoprolol on hold. ECHO showed mild LVH, calcified AV without AS, calcified MV with probable sever MS, LAE. Cardiology believes MV should be further evaluated. TEE scheduled for today, currently NPO. Possible R and L heart cath tomorrow. Recommends continuing beta blocker for MS and monitoring for Afib RVR. Hold coumadin for possible cath. Pharmacy dosing IV heparin. Repeat orthostatics to evaluate improvement while off of amlodipine. Consider addition  of compression stockings or midodrine if needed.  New neurologic deficit of tongue deviation to the right: prior hx of left MCA stroke, but neuro exam has been normal per chart review and previous exams. MRI brain negative for acute infarct.   Paroxysmal atrial fibrillation on coumadin: currently in sinus rhythm with first-degree AV block. Coumadin on hold for possible cath. Pharmacy dosing Heparin. Continue cardiac monitoring.  HTN: borderline hypotension at present. 115/56 last pm. 111/63 this am. Continue to hold norvasc. Cardiology recommends resuming beta blocker for MS.  History of CVA / Asymptomatic stenosis of right carotid artery s/p right CEA 03/10/16: CTAhead and neck on 11/10 with patent right carotid endarterecomy site. Hematoma at operative site. Currently stable. Continue to monitor. Follow up with vascular surgery outpatient.   Constipation: Last BM 11/12 per shift assessment record. No symptoms reported. Continue Colace BID and MiraLax daily.  Mitral stenosis/Mildpulmonary hypertension: ECHO 12/2015 showed normal EF with severe diffuse thickening and calcification of aortic valve, prior mitral valvuloplasty with severe thickened mitral valve leaflets and associated moderate mitral stenosis which has progressed since 2016. Pulmonary hypertension was characterized as mild with associated increase in right ventricular systolic pressure. ECHO 03/23/2016 showed mild LVH, calcified AV without AS, calcified MV with probable sever MS, LAE. TEE planned for today to further evaluate MV.   HLD: continue Lipitor.  DVT Prophylaxis: Heparin - pharmacy to dose  Code Status: Full code  Family Communication: Daughter at bedside  Disposition Plan: Pending further evaluation  Antimicrobial agents: None  Procedures: EEG ECHO TEE planned Possible cath  CONSULTS: Neurology Cardiology  Time spent: 30 minutes  MEDICATIONS: Anti-infectives    None  Scheduled  Meds: . atorvastatin  80 mg Oral q1800  . docusate sodium  100 mg Oral BID  . polyethylene glycol  17 g Oral Daily   Continuous Infusions: . heparin 850 Units/hr (03/24/16 0841)   PRN Meds:.   PHYSICAL EXAM: Vital signs: Vitals:   03/23/16 0831 03/23/16 1054 03/23/16 2213 03/24/16 0552  BP: 120/64 99/63 (!) 115/56 111/63  Pulse: 92 (!) 109 84 98  Resp: 20 16 18 18   Temp: 97.9 F (36.6 C) 98.1 F (36.7 C) 98.5 F (36.9 C) 98.6 F (37 C)  TempSrc: Oral Oral Oral Oral  SpO2: 100%  100% 100%  Weight:      Height:       Filed Weights   03/21/16 1504  Weight: 72.8 kg (160 lb 6.4 oz)   Body mass index is 29.34 kg/m.   General appearance: Awake, alert, not in any distress. Speech Clear. Not toxic Looking Eyes: pupils equally reactive to light and accomodation,no scleral icterus.Pink conjunctiva HEENT: Atraumatic and Normocephalic Neck: supple, no JVD. No cervical lymphadenopathy. No thyromegaly Resp:Good air entry bilaterally, no added sounds  CVS: S1 S2 regular, no murmurs.  GI: Bowel sounds present, Non tender and not distended with no gaurding, rigidity or rebound.No organomegaly Extremities: B/L Lower Ext shows no edema, both legs are warm to touch Neurology:  speech clear,Non focal, sensation is grossly intact. Psychiatric: Normal judgment and insight. Alert and oriented x 3. Normal mood. Musculoskeletal:gait appears to be normal.No digital cyanosis Skin:No Rash, warm and dry Wounds:N/A  I have personally reviewed following labs and imaging studies  LABORATORY DATA: CBC:  Recent Labs Lab 03/19/16 0318 03/21/16 0641 03/22/16 0905 03/24/16 0527  WBC 5.6 6.5 4.4 6.5  NEUTROABS 3.4  --   --   --   HGB 11.9* 11.5* 12.3 12.1  HCT 37.6 36.9 39.5 38.6  MCV 84.3 84.4 84.6 85.0  PLT 183 237 243 694    Basic Metabolic Panel:  Recent Labs Lab 03/19/16 0318 03/19/16 0420 03/19/16 1028 03/20/16 0445 03/21/16 0641 03/22/16 0905  NA 138  --   --  139 137 139   K 3.1*  --   --  5.3* 3.9 3.7  CL 103  --   --  107 104 104  CO2 22  --   --  24 22 25   GLUCOSE 143*  --   --  93 101* 103*  BUN 12  --   --  13 16 15   CREATININE 0.95  --   --  0.75 0.82 0.83  CALCIUM 9.7  --   --  9.6 9.7 9.6  MG  --  2.0  --   --   --   --   PHOS  --   --  3.7  --   --   --     GFR: Estimated Creatinine Clearance: 64 mL/min (by C-G formula based on SCr of 0.83 mg/dL).  Liver Function Tests:  Recent Labs Lab 03/19/16 0318  AST 28  ALT 24  ALKPHOS 75  BILITOT 1.2  PROT 8.5*  ALBUMIN 3.7   No results for input(s): LIPASE, AMYLASE in the last 168 hours. No results for input(s): AMMONIA in the last 168 hours.  Coagulation Profile:  Recent Labs Lab 03/20/16 0445 03/21/16 0641 03/22/16 0905 03/23/16 0523 03/24/16 0527  INR 3.93 2.26 1.85 1.95 1.81    Cardiac Enzymes: No results for input(s): CKTOTAL, CKMB, CKMBINDEX, TROPONINI in the last 168 hours.  BNP (last 3  results)  Recent Labs  10/10/15 1343 01/20/16 1539  PROBNP 238.0* 136.0*    HbA1C: No results for input(s): HGBA1C in the last 72 hours.  CBG:  Recent Labs Lab 03/21/16 0707  GLUCAP 89    Lipid Profile: No results for input(s): CHOL, HDL, LDLCALC, TRIG, CHOLHDL, LDLDIRECT in the last 72 hours.  Thyroid Function Tests: No results for input(s): TSH, T4TOTAL, FREET4, T3FREE, THYROIDAB in the last 72 hours.  Anemia Panel: No results for input(s): VITAMINB12, FOLATE, FERRITIN, TIBC, IRON, RETICCTPCT in the last 72 hours.  Urine analysis:    Component Value Date/Time   COLORURINE YELLOW 03/21/2016 0830   APPEARANCEUR CLEAR 03/21/2016 0830   LABSPEC 1.010 03/21/2016 0830   PHURINE 7.0 03/21/2016 0830   GLUCOSEU NEGATIVE 03/21/2016 0830   GLUCOSEU NEGATIVE 07/22/2015 1444   HGBUR NEGATIVE 03/21/2016 0830   BILIRUBINUR NEGATIVE 03/21/2016 0830   KETONESUR NEGATIVE 03/21/2016 0830   PROTEINUR NEGATIVE 03/21/2016 0830   UROBILINOGEN 0.2 07/22/2015 1444   NITRITE  NEGATIVE 03/21/2016 0830   LEUKOCYTESUR NEGATIVE 03/21/2016 0830    Sepsis Labs: Lactic Acid, Venous No results found for: LATICACIDVEN  MICROBIOLOGY: No results found for this or any previous visit (from the past 240 hour(s)).  RADIOLOGY STUDIES/RESULTS: Ct Angio Head W Or Wo Contrast  Result Date: 03/19/2016 CLINICAL DATA:  Acute onset mental status change. EXAM: CT ANGIOGRAPHY HEAD AND NECK TECHNIQUE: Multidetector CT imaging of the head and neck was performed using the standard protocol during bolus administration of intravenous contrast. Multiplanar CT image reconstructions and MIPs were obtained to evaluate the vascular anatomy. Carotid stenosis measurements (when applicable) are obtained utilizing NASCET criteria, using the distal internal carotid diameter as the denominator. CONTRAST:  50 mL Isovue 370 IV COMPARISON:  CTA head and neck 01/29/2016 FINDINGS: CT HEAD FINDINGS Brain: There is an old left cerebellar infarct. There is left MCA distribution encephalomalacia at the site of prior infarct. No CT evidence of acute cortical infarct. No acute hemorrhage. No midline shift or mass effect. No extra-axial collection. Vascular: No hyperdense vessel or unexpected calcification. Skull: Normal. Negative for fracture or focal lesion. Sinuses: Imaged portions are clear. Orbits: Normal. Review of the MIP images confirms the above findings CTA NECK FINDINGS Aortic arch: There is atherosclerotic calcification within the aortic arch. There is a normal branching pattern. The subclavian arteries are normal proximally. There is atherosclerotic calcification within the proximal arch vessels. Right carotid system: There is mild narrowing of the right common carotid artery at the level of the thyroid secondary to mass effect from adjacent soft tissue. Otherwise, no hemodynamically significant stenosis. The right internal carotid artery is normal. Left carotid system: There is mild atherosclerotic  calcification of the left carotid bifurcation without hemodynamically significant stenosis. Vertebral arteries: There is narrowing of the right vertebral artery origin secondary to atherosclerotic calcification. There is mild calcification of the left vertebral artery origin without significant stenosis. The vertebral system is right dominant. The V2, V3 and V4 segments of the vertebral arteries are normal to the confluence with the basilar artery. Skeleton: There is no bony spinal canal stenosis. No lytic or blastic lesions. Other neck: There is marked soft tissue prominence within the right carotid space, which is likely secondary to recent right carotid endarterectomy performed March 10, 2016. Upper chest: There is a ground-glass opacity measuring 4 mm in the left upper lobe. Review of the MIP images confirms the above findings CTA HEAD FINDINGS Anterior circulation: --Intracranial internal carotid arteries: Mild calcification of the  internal carotid arteries at the skullbase. --Anterior cerebral arteries: Normal. --Middle cerebral arteries: Normal. --Posterior communicating arteries: Absent bilaterally. Posterior circulation: --Posterior cerebral arteries: Normal. --Superior cerebellar arteries: Normal. --Basilar artery: Normal. --Anterior inferior cerebellar arteries: Not visualized, which is not uncommon. --Posterior inferior cerebellar arteries: Normal. Venous sinuses: As permitted by contrast timing, patent. Anatomic variants: None. Delayed phase: No abnormal parenchymal enhancement. Review of the MIP images confirms the above findings IMPRESSION: 1. No acute intracranial abnormality. 2. 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 3. Normal CTA of the circle of Willis and intracranial arteries. 4. Status post right carotid endarterectomy with greatly improved patency. 5. No hemodynamically significant stenosis of the left carotid system. Electronically Signed   By: Ulyses Jarred M.D.   On:  03/19/2016 06:27   Dg Abd 1 View  Result Date: 03/19/2016 CLINICAL DATA:  Constipation. EXAM: ABDOMEN - 1 VIEW COMPARISON:  No recent prior. FINDINGS: Soft tissue structures are unremarkable. Ill-defined radiopacity is noted stomach, this may represent debris within the stomach. Stool noted throughout colon suggests constipation. No bowel distention. No free air. Degenerative changes lumbar spine and both hips. Aortoiliac atherosclerotic vascular calcification. Surgical clips sutures in the pelvis. IMPRESSION: No acute abnormality identified. Prominent amount of stool in the colon suggesting constipation. Electronically Signed   By: Marcello Moores  Register   On: 03/19/2016 09:56   Ct Head Wo Contrast  Result Date: 03/21/2016 CLINICAL DATA:  Frontal and right-sided headache after dizziness and fall. Right-sided neck pain. Carotid endarterectomy March 10, 2016 EXAM: CT HEAD WITHOUT CONTRAST CT CERVICAL SPINE WITHOUT CONTRAST TECHNIQUE: Multidetector CT imaging of the head and cervical spine was performed following the standard protocol without intravenous contrast. Multiplanar CT image reconstructions of the cervical spine were also generated. COMPARISON:  March 19, 2016 CT scan FINDINGS: CT HEAD FINDINGS Brain: No subdural, epidural, or subarachnoid hemorrhage. Small infarct in the left cerebellar hemisphere, unchanged. Left parietal infarct with encephalomalacia, unchanged. No acute cortical ischemia or infarct. Ventricles and sulci are stable. Brainstem and basal cisterns are normal. No mass, mass effect, or midline shift. Vascular: Calcified atherosclerosis in the intracranial portions of the carotid arteries. Skull: Normal. Negative for fracture or focal lesion. Sinuses/Orbits: No acute finding. Other: None. CT CERVICAL SPINE FINDINGS Alignment: Straightening of normal lordosis with no traumatic malalignment. Skull base and vertebrae: No fractures. Soft tissues and spinal canal: Hematoma in the right side  of the neck from recent right carotid endarterectomy exerts mass effect on the right thyroid lobe. The CTA of the neck from March 19, 2016 demonstrated mild narrowing of the carotid artery as it passed posterior to the hematoma. The carotid artery is not well assessed in this region today. The hematoma is similar in size today. No other acute soft tissue abnormalities. Disc levels: Multilevel degenerative changes with anterior and posterior osteophytes. Partial calcification of the posterior longitudinal ligament. The posterior osteophytes are most prominent at T1-2 and T2-3 with narrowing of the spinal canal at these levels. At T1-2, the spinal canal measures 7 mm in maximum diameter. Upper chest: Negative. Other: No other abnormalities. IMPRESSION: 1. No acute intracranial process. Left cerebellar and left parietal infarcts, chronic. 2. No fracture or traumatic malalignment in the cervical spine. 3. Degenerative changes. Partial calcification of the posterior longitudinal ligament. Narrowing of the spinal canal due to these findings, most marked at T1-2 and T2-3 with an AP diameter of the spinal canal at T1-2 of 7 mm. 4. Hematoma in the right side of the neck  from recent surgery. Electronically Signed   By: Dorise Bullion III M.D   On: 03/21/2016 09:50   Ct Angio Neck W And/or Wo Contrast  Result Date: 03/19/2016 CLINICAL DATA:  Acute onset mental status change. EXAM: CT ANGIOGRAPHY HEAD AND NECK TECHNIQUE: Multidetector CT imaging of the head and neck was performed using the standard protocol during bolus administration of intravenous contrast. Multiplanar CT image reconstructions and MIPs were obtained to evaluate the vascular anatomy. Carotid stenosis measurements (when applicable) are obtained utilizing NASCET criteria, using the distal internal carotid diameter as the denominator. CONTRAST:  50 mL Isovue 370 IV COMPARISON:  CTA head and neck 01/29/2016 FINDINGS: CT HEAD FINDINGS Brain: There is an  old left cerebellar infarct. There is left MCA distribution encephalomalacia at the site of prior infarct. No CT evidence of acute cortical infarct. No acute hemorrhage. No midline shift or mass effect. No extra-axial collection. Vascular: No hyperdense vessel or unexpected calcification. Skull: Normal. Negative for fracture or focal lesion. Sinuses: Imaged portions are clear. Orbits: Normal. Review of the MIP images confirms the above findings CTA NECK FINDINGS Aortic arch: There is atherosclerotic calcification within the aortic arch. There is a normal branching pattern. The subclavian arteries are normal proximally. There is atherosclerotic calcification within the proximal arch vessels. Right carotid system: There is mild narrowing of the right common carotid artery at the level of the thyroid secondary to mass effect from adjacent soft tissue. Otherwise, no hemodynamically significant stenosis. The right internal carotid artery is normal. Left carotid system: There is mild atherosclerotic calcification of the left carotid bifurcation without hemodynamically significant stenosis. Vertebral arteries: There is narrowing of the right vertebral artery origin secondary to atherosclerotic calcification. There is mild calcification of the left vertebral artery origin without significant stenosis. The vertebral system is right dominant. The V2, V3 and V4 segments of the vertebral arteries are normal to the confluence with the basilar artery. Skeleton: There is no bony spinal canal stenosis. No lytic or blastic lesions. Other neck: There is marked soft tissue prominence within the right carotid space, which is likely secondary to recent right carotid endarterectomy performed March 10, 2016. Upper chest: There is a ground-glass opacity measuring 4 mm in the left upper lobe. Review of the MIP images confirms the above findings CTA HEAD FINDINGS Anterior circulation: --Intracranial internal carotid arteries: Mild  calcification of the internal carotid arteries at the skullbase. --Anterior cerebral arteries: Normal. --Middle cerebral arteries: Normal. --Posterior communicating arteries: Absent bilaterally. Posterior circulation: --Posterior cerebral arteries: Normal. --Superior cerebellar arteries: Normal. --Basilar artery: Normal. --Anterior inferior cerebellar arteries: Not visualized, which is not uncommon. --Posterior inferior cerebellar arteries: Normal. Venous sinuses: As permitted by contrast timing, patent. Anatomic variants: None. Delayed phase: No abnormal parenchymal enhancement. Review of the MIP images confirms the above findings IMPRESSION: 1. No acute intracranial abnormality. 2. 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 3. Normal CTA of the circle of Willis and intracranial arteries. 4. Status post right carotid endarterectomy with greatly improved patency. 5. No hemodynamically significant stenosis of the left carotid system. Electronically Signed   By: Ulyses Jarred M.D.   On: 03/19/2016 06:27   Ct Cervical Spine Wo Contrast  Result Date: 03/21/2016 CLINICAL DATA:  Frontal and right-sided headache after dizziness and fall. Right-sided neck pain. Carotid endarterectomy March 10, 2016 EXAM: CT HEAD WITHOUT CONTRAST CT CERVICAL SPINE WITHOUT CONTRAST TECHNIQUE: Multidetector CT imaging of the head and cervical spine was performed following the standard protocol without intravenous contrast.  Multiplanar CT image reconstructions of the cervical spine were also generated. COMPARISON:  March 19, 2016 CT scan FINDINGS: CT HEAD FINDINGS Brain: No subdural, epidural, or subarachnoid hemorrhage. Small infarct in the left cerebellar hemisphere, unchanged. Left parietal infarct with encephalomalacia, unchanged. No acute cortical ischemia or infarct. Ventricles and sulci are stable. Brainstem and basal cisterns are normal. No mass, mass effect, or midline shift. Vascular: Calcified atherosclerosis  in the intracranial portions of the carotid arteries. Skull: Normal. Negative for fracture or focal lesion. Sinuses/Orbits: No acute finding. Other: None. CT CERVICAL SPINE FINDINGS Alignment: Straightening of normal lordosis with no traumatic malalignment. Skull base and vertebrae: No fractures. Soft tissues and spinal canal: Hematoma in the right side of the neck from recent right carotid endarterectomy exerts mass effect on the right thyroid lobe. The CTA of the neck from March 19, 2016 demonstrated mild narrowing of the carotid artery as it passed posterior to the hematoma. The carotid artery is not well assessed in this region today. The hematoma is similar in size today. No other acute soft tissue abnormalities. Disc levels: Multilevel degenerative changes with anterior and posterior osteophytes. Partial calcification of the posterior longitudinal ligament. The posterior osteophytes are most prominent at T1-2 and T2-3 with narrowing of the spinal canal at these levels. At T1-2, the spinal canal measures 7 mm in maximum diameter. Upper chest: Negative. Other: No other abnormalities. IMPRESSION: 1. No acute intracranial process. Left cerebellar and left parietal infarcts, chronic. 2. No fracture or traumatic malalignment in the cervical spine. 3. Degenerative changes. Partial calcification of the posterior longitudinal ligament. Narrowing of the spinal canal due to these findings, most marked at T1-2 and T2-3 with an AP diameter of the spinal canal at T1-2 of 7 mm. 4. Hematoma in the right side of the neck from recent surgery. Electronically Signed   By: Dorise Bullion III M.D   On: 03/21/2016 09:50   Mr Brain Wo Contrast  Result Date: 03/22/2016 CLINICAL DATA:  New tongue deviation to the right. Prior left MCA stroke. Multiple recent syncopal episodes. EXAM: MRI HEAD WITHOUT CONTRAST TECHNIQUE: Multiplanar, multiecho pulse sequences of the brain and surrounding structures were obtained without  intravenous contrast. COMPARISON:  Head CT 03/21/2016 and MRI 04/06/2013 FINDINGS: Brain: There is no evidence of acute infarct, mass, midline shift, or extra-axial fluid collection. A chronic left MCA infarct is again seen per my early involving the parietal lobe with a small amount of chronic blood products. There is ex vacuo dilatation of the left lateral ventricle. Small focus of cortical encephalomalacia at the level of the posterior right insula is unchanged. Small foci of scattered T2 hyperintensity in both cerebral hemispheres are similar to the prior MRI and nonspecific but compatible with mild chronic small vessel ischemic disease. Small chronic infarcts are again seen in the basal ganglia bilaterally and left cerebellum. Vascular: Major intracranial vascular flow voids are preserved. Skull and upper cervical spine: Skull hyperostosis. No suspicious osseous lesion. Sinuses/Orbits: Unremarkable orbits. Trace left mastoid effusion. No significant paranasal sinus disease. Other: None. IMPRESSION: 1. No acute intracranial abnormality. 2. Chronic left MCA infarct. 3. Chronic small vessel ischemic changes as above. Electronically Signed   By: Logan Bores M.D.   On: 03/22/2016 13:02     LOS: 1 day   Filiberto Pinks, PA-S  [facility] Pager:[phone]  If 7PM-7AM, please contact night-coverage [email or web address] Password [facility] 03/24/2016, 9:29 AM  Attending MD note  Patient was seen, examined,treatment plan was discussed with the PA-S. I  have personally reviewed the clinical findings, lab, imaging studies and management of this patient in detail. I agree with the documentation, as recorded by the PA-S.   Patient in bed comfortably this morning. Chest pain or shortness of breath  On Exam: Gen. exam: Awake, alert, not in any distress Chest: Good air entry bilaterally, no rhonchi or rales CVS: S1-S2 regular, no murmurs Abdomen: Soft, nontender and nondistended Neurology: Non-focal Skin:  No rash or lesions  Imp Suspected syncope due to orthostatic mechanism Moderate MS Paroxysmal atrial fibrillation Recent right carotid endarterectomy  Plan: TEE/right heart catheterization planned by cardiology Repeat orthostatic vital signs tomorrow Continue metoprolol and heparin infusion for now.  Rest as above  [facility]

## 2016-03-24 NOTE — Evaluation (Signed)
Physical Therapy Evaluation & Discharge Patient Details Name: Marie Malone MRN: [id number] DOB: 11/30/1950 Today's Date: 03/24/2016   History of Present Illness  Pt is a 65 yo female admitted after an episode of unresponsiveness at home but did not lose consiousness.  Pt had a CEA on 03/10/16 and since then has now been admitted twice for these episodes.  EEG is normal and ECHO is pending.  MD seems to think it may be orthostatic.  Pt with h/o of mitral stenosis, pulmonary HTN and CVA.   Clinical Impression  Patient presents close to functional baseline.  Feel she can mobilize with nursing or family assist during remainder of acute stay and states she feels as if functioning close to her baseline.  No further skilled PT needs at this time.  Will sign off.     Follow Up Recommendations No PT follow up    Equipment Recommendations  None recommended by PT    Recommendations for Other Services       Precautions / Restrictions Precautions Precautions: Fall Precaution Comments: only falls during these episodes of altered consiousness      Mobility  Bed Mobility Overal bed mobility: Modified Independent                Transfers Overall transfer level: Modified independent Equipment used: None                Ambulation/Gait Ambulation/Gait assistance: Independent Ambulation Distance (Feet): 250 Feet Assistive device: None Gait Pattern/deviations: Step-through pattern;WFL(Within Functional Limits);Trunk flexed     General Gait Details: no LOB or evidence for imbalance, does have flexed posture and slower speed  Stairs Stairs: Yes Stairs assistance: Supervision Stair Management: One rail Right Number of Stairs: 4 General stair comments: states does have something to hold onto though no rail on steps at home; supervision for safety with IV  Wheelchair Mobility    Modified Rankin (Stroke Patients Only)       Balance Overall balance assessment: No apparent  balance deficits (not formally assessed)                                           Pertinent Vitals/Pain Pain Assessment: No/denies pain    Home Living Family/patient expects to be discharged to:: Private residence Living Arrangements: Spouse/significant other Available Help at Discharge: Family;Available 24 hours/day Type of Home: House Home Access: Stairs to enter Entrance Stairs-Rails: None Entrance Stairs-Number of Steps: 3 Home Layout: One level;Other (Comment) Home Equipment: Cane - single point;Shower seat      Prior Function Level of Independence: Independent         Comments: Pt drives. Husband does not. Pt pays all bills. Both do cooking and cleaning.     Hand Dominance   Dominant Hand: Left    Extremity/Trunk Assessment   Upper Extremity Assessment: Defer to OT evaluation           Lower Extremity Assessment: Overall WFL for tasks assessed      Cervical / Trunk Assessment: Normal  Communication   Communication: No difficulties  Cognition Arousal/Alertness: Awake/alert Behavior During Therapy: WFL for tasks assessed/performed Overall Cognitive Status: History of cognitive impairments - at baseline       Memory: Decreased short-term memory              General Comments      Exercises  Assessment/Plan    PT Assessment [place] does not need any further PT services  PT Problem List            PT Treatment Interventions      PT Goals (Current goals can be found in the Care Plan section)  Acute Rehab PT Goals PT Goal Formulation: All assessment and education complete, DC therapy    Frequency     Barriers to discharge        Co-evaluation               End of Session Equipment Utilized During Treatment: Gait belt Activity Tolerance: Patient tolerated treatment well Patient left: in bed;with call bell/phone within reach;with family/visitor present           Time: 3567-0141 PT Time  Calculation (min) (ACUTE ONLY): 22 min   Charges:   PT Evaluation $PT Eval Moderate Complexity: 1 Procedure     PT G CodesReginia Naas 04-16-2016, 12:23 PM  Magda Kiel, [place] 2016/04/16

## 2016-03-24 NOTE — Progress Notes (Signed)
ANTICOAGULATION CONSULT NOTE - Follow up [place] for Heparin Indication: atrial fibrillation  Vital Signs: Temp: 98.5 F (36.9 C) (11/14 2213) Temp Source: Oral (11/14 2213) BP: 115/56 (11/14 2213) Pulse Rate: 84 (11/14 2213)  Labs:  Recent Labs  03/21/16 0641 03/22/16 0905 03/23/16 0523 03/23/16 2344  HGB 11.5* 12.3  --   --   HCT 36.9 39.5  --   --   PLT 237 243  --   --   LABPROT 25.3* 21.6* 22.5*  --   INR 2.26 1.85 1.95  --   HEPARINUNFRC  --   --   --  0.61  CREATININE 0.82 0.83  --   --     Estimated Creatinine Clearance: 64 mL/min (by C-G formula based on SCr of 0.83 mg/dL).  Assessment: 65 y.o. female with h/o Afib, Coumadin on hold, for heparin  Goal of Therapy:  Heparin level = 0.3-0.7 Monitor platelets by anticoagulation protocol: Yes   Plan:  Continue Heparin at current rate  Follow-up am labs.  Phillis Knack, PharmD, BCPS   12:20 AM, 03/24/2016

## 2016-03-24 NOTE — Progress Notes (Deleted)
  Echocardiogram Echocardiogram Transesophageal has been performed.  Donata Clay 03/24/2016, 2:13 PM

## 2016-03-24 NOTE — Progress Notes (Signed)
ANTICOAGULATION CONSULT NOTE - Follow up [place] for Heparin Indication: atrial fibrillation  Vital Signs: Temp: 98.6 F (37 C) (11/15 0552) Temp Source: Oral (11/15 0552) BP: 111/63 (11/15 0552) Pulse Rate: 98 (11/15 0552)  Labs:  Recent Labs  03/22/16 0905 03/23/16 0523 03/23/16 2344 03/24/16 0527  HGB 12.3  --   --  12.1  HCT 39.5  --   --  38.6  PLT 243  --   --  172  LABPROT 21.6* 22.5*  --  21.2*  INR 1.85 1.95  --  1.81  HEPARINUNFRC  --   --  0.61 0.84*  CREATININE 0.83  --   --   --     Estimated Creatinine Clearance: 64 mL/min (by C-G formula based on SCr of 0.83 mg/dL).  Assessment: 65 y.o. female with h/o Afib, warfarin on hold, heparin started in preparation for cath. Heparin level this am is supratherapeutic at 0.84. CBC stable. No bleeding noted.  Goal of Therapy:  Heparin level = 0.3-0.7 Monitor platelets by anticoagulation protocol: Yes   Plan:  Decrease heparin infusion to 850 units/hr Check anti-Xa level in 6 hours and daily while on heparin Continue to monitor H&H and platelets   Thank you for allowing [place] to participate in this patients care. Jens Som, PharmD Pager: [phone]  8:53 AM, 03/24/2016

## 2016-03-24 NOTE — Interval H&P Note (Signed)
History and Physical Interval Note:  03/24/2016 11:57 AM  Marie Malone  has presented today for surgery, with the diagnosis of mitral stenosis  The various methods of treatment have been discussed with the patient and family. After consideration of risks, benefits and other options for treatment, the patient has consented to  Procedure(s): TRANSESOPHAGEAL ECHOCARDIOGRAM (TEE) (N/A) as a surgical intervention .  The patient's history has been reviewed, patient examined, no change in status, stable for surgery.  I have reviewed the patient's chart and labs.  Questions were answered to the patient's satisfaction.     [employer]

## 2016-03-24 NOTE — Progress Notes (Signed)
  Echocardiogram Echocardiogram Transesophageal has been performed.  Marie Malone 03/24/2016, 2:12 PM

## 2016-03-24 NOTE — Progress Notes (Signed)
ANTICOAGULATION CONSULT NOTE - Follow up [place] for Heparin Indication: atrial fibrillation  Vital Signs: Temp: 97.8 F (36.6 C) (11/15 1403) Temp Source: Oral (11/15 1412) BP: 123/66 (11/15 1412) Pulse Rate: 102 (11/15 1412)  Labs:  Recent Labs  03/22/16 0905 03/23/16 0523  03/24/16 0527 03/24/16 1501 03/24/16 2042  HGB 12.3  --   --  12.1  --   --   HCT 39.5  --   --  38.6  --   --   PLT 243  --   --  172  --   --   LABPROT 21.6* 22.5*  --  21.2*  --   --   INR 1.85 1.95  --  1.81  --   --   HEPARINUNFRC  --   --   < > 0.84* 0.68 0.79*  CREATININE 0.83  --   --   --   --   --   < > = values in this interval not displayed.  Estimated Creatinine Clearance: 64 mL/min (by C-G formula based on SCr of 0.83 mg/dL).  Assessment: 65 y.o. female with h/o Afib, warfarin on hold, heparin started in preparation for cath. Heparin level this afternoon is therapeutic at 0.68. CBC stable. No bleeding noted. - - INR 1.81, HL 0.79 up.  Goal of Therapy:  Heparin level = 0.3-0.7 Monitor platelets by anticoagulation protocol: Yes   Plan:  Decrease IV heparin to 700 units/hr Recheck HL with AM labs.  Zedrick Springsteen S. Alford Highland, PharmD, [place] Clinical Staff Pharmacist Pager [phone]   9:52 PM, 03/24/2016

## 2016-03-24 NOTE — H&P (View-Only) (Signed)
Patient Name: Marie Malone Date of Encounter: 03/24/2016  Primary Cardiologist: [facility] Problem List     Principal Problem:   Transient alteration of awareness Active Problems:   HTN (hypertension)   Atrial fibrillation (HCC)   HLD (hyperlipidemia)   Asymptomatic stenosis of right carotid artery s/p right CEA 03/10/16   Constipation   Altered mental status     Subjective   Less dizzy - washed up at sink, walked with PT. Denies dyspnea. No atrial fibrillation on monitor, but persistent sinus tachycardia.  Inpatient Medications    Scheduled Meds: . atorvastatin  80 mg Oral q1800  . docusate sodium  100 mg Oral BID  . metoprolol succinate  25 mg Oral Daily  . polyethylene glycol  17 g Oral Daily   Continuous Infusions: . heparin 850 Units/hr (03/24/16 0841)   PRN Meds:    Vital Signs    Vitals:   03/23/16 0831 03/23/16 1054 03/23/16 2213 03/24/16 0552  BP: 120/64 99/63 (!) 115/56 111/63  Pulse: 92 (!) 109 84 98  Resp: 20 16 18 18   Temp: 97.9 F (36.6 C) 98.1 F (36.7 C) 98.5 F (36.9 C) 98.6 F (37 C)  TempSrc: Oral Oral Oral Oral  SpO2: 100%  100% 100%  Weight:      Height:        Intake/Output Summary (Last 24 hours) at 03/24/16 1055 Last data filed at 03/24/16 2229  Gross per 24 hour  Intake           314.17 ml  Output                0 ml  Net           314.17 ml   Filed Weights   03/21/16 1504  Weight: 160 lb 6.4 oz (72.8 kg)    Physical Exam  Comfortable at rest GEN: Well nourished, well developed, in no acute distress.  HEENT: Grossly normal.  Neck: Supple, no JVD, carotid bruits, or masses. Right endarterectomy scar Cardiac: RRR, early and loud opening snap, very faint apical rumble, no rubs or gallops. No clubbing, cyanosis, edema.  Radials/DP/PT 2+ and equal bilaterally.  Respiratory:  Respirations regular and unlabored, clear to auscultation bilaterally. GI: Soft, nontender, nondistended, BS + x 4. MS: no deformity or  atrophy. Skin: warm and dry, no rash. Neuro:  Strength and sensation are intact. Psych: AAOx3.  Normal affect.  Labs    CBC  Recent Labs  03/22/16 0905 03/24/16 0527  WBC 4.4 6.5  HGB 12.3 12.1  HCT 39.5 38.6  MCV 84.6 85.0  PLT 243 798   Basic Metabolic Panel  Recent Labs  03/22/16 0905  NA 139  K 3.7  CL 104  CO2 25  GLUCOSE 103*  BUN 15  CREATININE 0.83  CALCIUM 9.6   INR 1.8  Telemetry    Sinus tachycardia - Personally Reviewed  Radiology    Mr Brain Wo Contrast  Result Date: 03/22/2016 CLINICAL DATA:  New tongue deviation to the right. Prior left MCA stroke. Multiple recent syncopal episodes. EXAM: MRI HEAD WITHOUT CONTRAST TECHNIQUE: Multiplanar, multiecho pulse sequences of the brain and surrounding structures were obtained without intravenous contrast. COMPARISON:  Head CT 03/21/2016 and MRI 04/06/2013 FINDINGS: Brain: There is no evidence of acute infarct, mass, midline shift, or extra-axial fluid collection. A chronic left MCA infarct is again seen per my early involving the parietal lobe with a small amount of chronic blood products. There is  ex vacuo dilatation of the left lateral ventricle. Small focus of cortical encephalomalacia at the level of the posterior right insula is unchanged. Small foci of scattered T2 hyperintensity in both cerebral hemispheres are similar to the prior MRI and nonspecific but compatible with mild chronic small vessel ischemic disease. Small chronic infarcts are again seen in the basal ganglia bilaterally and left cerebellum. Vascular: Major intracranial vascular flow voids are preserved. Skull and upper cervical spine: Skull hyperostosis. No suspicious osseous lesion. Sinuses/Orbits: Unremarkable orbits. Trace left mastoid effusion. No significant paranasal sinus disease. Other: None. IMPRESSION: 1. No acute intracranial abnormality. 2. Chronic left MCA infarct. 3. Chronic small vessel ischemic changes as above. Electronically  Signed   By: Logan Bores M.D.   On: 03/22/2016 13:02    Cardiac Studies   Echo 03/23/16  - Left ventricle: The cavity size was normal. Wall thickness was   increased in a pattern of mild LVH. Systolic function was normal.   The estimated ejection fraction was in the range of 55% to 60%.   Wall motion was normal; there were no regional wall motion   abnormalities. Doppler parameters are consistent with high   ventricular filling pressure. - Ventricular septum: The contour showed diastolic flattening and   systolic flattening. - Mitral valve: Calcified annulus. Severely thickened leaflets .   The findings are consistent with severe stenosis. There was mild   regurgitation. Valve area by pressure half-time: 1.48 cm^2. - Left atrium: The atrium was severely dilated. - Atrial septum: The septum bowed from left to right, consistent   with increased left atrial pressure. - Pericardium, extracardiac: A small pericardial effusion was   identified.  Impressions:  - Normal LV systolic function; mild LVH; elevated LV filling   pressure; calcified aortic valve with no significant AS;   thickened, calcified MV with probable severe MS (MVA by pressure   halftime 1.48 cm); severe LAE.  Patient Profile     65 year old woman with at least moderate mitral stenosis following commissurotomy 2008, s/p recent R CEA with recurrent syncope, possibly explained by orthostatic hypotension, concern for possible AF related syncope.  Assessment & Plan    1. Syncope: no recurrence since admission. Probably orthostatic, but some concern for AFib/MS related syncope. AF documented in past, none since this admission. 2. Moderate (to severe?) MS: gradients are relatively high. Evaluate by TEE today. May still need R and L heart cath if severity remains uncertain. I am particularly concerned that we may be underestimating the PA pressure. 3. PAFib: documented in past. 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  Warfarin on hold for possible cath. 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 On IV heparin. 4. Sinus tachycardia: can be highly deleterious with the MS. Need to restart her beta blocker notwithstanding the problems with orthostatic hypotension. 5. Recent R CEA:  Poor postop PO intake and maybe neurovegetative impact of carotid sinus manipulation may have contributed to recent labile BP.  Signed, [name], MD  03/24/2016, 10:55 AM

## 2016-03-24 NOTE — Progress Notes (Signed)
ANTICOAGULATION CONSULT NOTE - Follow up [place] for Heparin Indication: atrial fibrillation  Vital Signs: Temp: 97.8 F (36.6 C) (11/15 1403) Temp Source: Oral (11/15 1412) BP: 123/66 (11/15 1412) Pulse Rate: 102 (11/15 1412)  Labs:  Recent Labs  03/22/16 0905 03/23/16 0523 03/23/16 2344 03/24/16 0527 03/24/16 1501  HGB 12.3  --   --  12.1  --   HCT 39.5  --   --  38.6  --   PLT 243  --   --  172  --   LABPROT 21.6* 22.5*  --  21.2*  --   INR 1.85 1.95  --  1.81  --   HEPARINUNFRC  --   --  0.61 0.84* 0.68  CREATININE 0.83  --   --   --   --     Estimated Creatinine Clearance: 64 mL/min (by C-G formula based on SCr of 0.83 mg/dL).  Assessment: 65 y.o. female with h/o Afib, warfarin on hold, heparin started in preparation for cath. Heparin level this afternoon is therapeutic at 0.68. CBC stable. No bleeding noted.  Goal of Therapy:  Heparin level = 0.3-0.7 Monitor platelets by anticoagulation protocol: Yes   Plan:  Continue heparin infusion at 850 units/hr Check anti-Xa level in 6 hours and daily while on heparin Continue to monitor H&H and platelets   Thank you for allowing [place] to participate in this patients care. Jens Som, PharmD Pager: [phone]  4:10 PM, 03/24/2016

## 2016-03-24 NOTE — Progress Notes (Signed)
Patient Name: Marie Malone Date of Encounter: 03/24/2016  Primary Cardiologist: [facility] Problem List     Principal Problem:   Transient alteration of awareness Active Problems:   HTN (hypertension)   Atrial fibrillation (HCC)   HLD (hyperlipidemia)   Asymptomatic stenosis of right carotid artery s/p right CEA 03/10/16   Constipation   Altered mental status     Subjective   Less dizzy - washed up at sink, walked with PT. Denies dyspnea. No atrial fibrillation on monitor, but persistent sinus tachycardia.  Inpatient Medications    Scheduled Meds: . atorvastatin  80 mg Oral q1800  . docusate sodium  100 mg Oral BID  . metoprolol succinate  25 mg Oral Daily  . polyethylene glycol  17 g Oral Daily   Continuous Infusions: . heparin 850 Units/hr (03/24/16 0841)   PRN Meds:    Vital Signs    Vitals:   03/23/16 0831 03/23/16 1054 03/23/16 2213 03/24/16 0552  BP: 120/64 99/63 (!) 115/56 111/63  Pulse: 92 (!) 109 84 98  Resp: 20 16 18 18   Temp: 97.9 F (36.6 C) 98.1 F (36.7 C) 98.5 F (36.9 C) 98.6 F (37 C)  TempSrc: Oral Oral Oral Oral  SpO2: 100%  100% 100%  Weight:      Height:        Intake/Output Summary (Last 24 hours) at 03/24/16 1055 Last data filed at 03/24/16 3614  Gross per 24 hour  Intake           314.17 ml  Output                0 ml  Net           314.17 ml   Filed Weights   03/21/16 1504  Weight: 160 lb 6.4 oz (72.8 kg)    Physical Exam  Comfortable at rest GEN: Well nourished, well developed, in no acute distress.  HEENT: Grossly normal.  Neck: Supple, no JVD, carotid bruits, or masses. Right endarterectomy scar Cardiac: RRR, early and loud opening snap, very faint apical rumble, no rubs or gallops. No clubbing, cyanosis, edema.  Radials/DP/PT 2+ and equal bilaterally.  Respiratory:  Respirations regular and unlabored, clear to auscultation bilaterally. GI: Soft, nontender, nondistended, BS + x 4. MS: no deformity or  atrophy. Skin: warm and dry, no rash. Neuro:  Strength and sensation are intact. Psych: AAOx3.  Normal affect.  Labs    CBC  Recent Labs  03/22/16 0905 03/24/16 0527  WBC 4.4 6.5  HGB 12.3 12.1  HCT 39.5 38.6  MCV 84.6 85.0  PLT 243 431   Basic Metabolic Panel  Recent Labs  03/22/16 0905  NA 139  K 3.7  CL 104  CO2 25  GLUCOSE 103*  BUN 15  CREATININE 0.83  CALCIUM 9.6   INR 1.8  Telemetry    Sinus tachycardia - Personally Reviewed  Radiology    Mr Brain Wo Contrast  Result Date: 03/22/2016 CLINICAL DATA:  New tongue deviation to the right. Prior left MCA stroke. Multiple recent syncopal episodes. EXAM: MRI HEAD WITHOUT CONTRAST TECHNIQUE: Multiplanar, multiecho pulse sequences of the brain and surrounding structures were obtained without intravenous contrast. COMPARISON:  Head CT 03/21/2016 and MRI 04/06/2013 FINDINGS: Brain: There is no evidence of acute infarct, mass, midline shift, or extra-axial fluid collection. A chronic left MCA infarct is again seen per my early involving the parietal lobe with a small amount of chronic blood products. There is  ex vacuo dilatation of the left lateral ventricle. Small focus of cortical encephalomalacia at the level of the posterior right insula is unchanged. Small foci of scattered T2 hyperintensity in both cerebral hemispheres are similar to the prior MRI and nonspecific but compatible with mild chronic small vessel ischemic disease. Small chronic infarcts are again seen in the basal ganglia bilaterally and left cerebellum. Vascular: Major intracranial vascular flow voids are preserved. Skull and upper cervical spine: Skull hyperostosis. No suspicious osseous lesion. Sinuses/Orbits: Unremarkable orbits. Trace left mastoid effusion. No significant paranasal sinus disease. Other: None. IMPRESSION: 1. No acute intracranial abnormality. 2. Chronic left MCA infarct. 3. Chronic small vessel ischemic changes as above. Electronically  Signed   By: Logan Bores M.D.   On: 03/22/2016 13:02    Cardiac Studies   Echo 03/23/16  - Left ventricle: The cavity size was normal. Wall thickness was   increased in a pattern of mild LVH. Systolic function was normal.   The estimated ejection fraction was in the range of 55% to 60%.   Wall motion was normal; there were no regional wall motion   abnormalities. Doppler parameters are consistent with high   ventricular filling pressure. - Ventricular septum: The contour showed diastolic flattening and   systolic flattening. - Mitral valve: Calcified annulus. Severely thickened leaflets .   The findings are consistent with severe stenosis. There was mild   regurgitation. Valve area by pressure half-time: 1.48 cm^2. - Left atrium: The atrium was severely dilated. - Atrial septum: The septum bowed from left to right, consistent   with increased left atrial pressure. - Pericardium, extracardiac: A small pericardial effusion was   identified.  Impressions:  - Normal LV systolic function; mild LVH; elevated LV filling   pressure; calcified aortic valve with no significant AS;   thickened, calcified MV with probable severe MS (MVA by pressure   halftime 1.48 cm); severe LAE.  Patient Profile     65 year old woman with at least moderate mitral stenosis following commissurotomy 2008, s/p recent R CEA with recurrent syncope, possibly explained by orthostatic hypotension, concern for possible AF related syncope.  Assessment & Plan    1. Syncope: no recurrence since admission. Probably orthostatic, but some concern for AFib/MS related syncope. AF documented in past, none since this admission. 2. Moderate (to severe?) MS: gradients are relatively high. Evaluate by TEE today. May still need R and L heart cath if severity remains uncertain. I am particularly concerned that we may be underestimating the PA pressure. 3. PAFib: documented in past. 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  Warfarin on hold for possible cath. 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 On IV heparin. 4. Sinus tachycardia: can be highly deleterious with the MS. Need to restart her beta blocker notwithstanding the problems with orthostatic hypotension. 5. Recent R CEA:  Poor postop PO intake and maybe neurovegetative impact of carotid sinus manipulation may have contributed to recent labile BP.  Signed, [name], MD  03/24/2016, 10:55 AM

## 2016-03-24 NOTE — Progress Notes (Deleted)
  Echocardiogram 2D Echocardiogram has been performed.  Donata Clay 03/24/2016, 2:11 PM

## 2016-03-25 ENCOUNTER — Encounter (HOSPITAL_COMMUNITY): Payer: Self-pay | Admitting: Cardiology

## 2016-03-25 ENCOUNTER — Encounter (HOSPITAL_COMMUNITY)
Admission: EM | Disposition: A | Discharge status: Home Health | Payer: Self-pay | Source: Home / Self Care | Attending: Internal Medicine

## 2016-03-25 ENCOUNTER — Encounter: Payer: BC Managed Care – PPO | Admitting: Surgery

## 2016-03-25 DIAGNOSIS — I2510 Atherosclerotic heart disease of native coronary artery without angina pectoris: Secondary | ICD-10-CM

## 2016-03-25 DIAGNOSIS — I441 Atrioventricular block, second degree: Secondary | ICD-10-CM

## 2016-03-25 DIAGNOSIS — R55 Syncope and collapse: Secondary | ICD-10-CM

## 2016-03-25 HISTORY — PX: CARDIAC CATHETERIZATION: SHX172

## 2016-03-25 LAB — POCT I-STAT 3, ART BLOOD GAS (G3+)
ACID-BASE DEFICIT: 2 mmol/L (ref 0.0–2.0)
Acid-base deficit: 2 mmol/L (ref 0.0–2.0)
BICARBONATE: 22.9 mmol/L (ref 20.0–28.0)
BICARBONATE: 24.4 mmol/L (ref 20.0–28.0)
O2 SAT: 87 %
O2 SAT: 93 %
PCO2 ART: 45.8 mmHg (ref 32.0–48.0)
PH ART: 7.334 — AB (ref 7.350–7.450)
PO2 ART: 57 mmHg — AB (ref 83.0–108.0)
PO2 ART: 68 mmHg — AB (ref 83.0–108.0)
TCO2: 24 mmol/L (ref 0–100)
TCO2: 26 mmol/L (ref 0–100)
pCO2 arterial: 37.1 mmHg (ref 32.0–48.0)
pH, Arterial: 7.399 (ref 7.350–7.450)

## 2016-03-25 LAB — PROTIME-INR
INR: 1.67
Prothrombin Time: 19.9 seconds — ABNORMAL HIGH (ref 11.4–15.2)

## 2016-03-25 LAB — POCT ACTIVATED CLOTTING TIME
ACTIVATED CLOTTING TIME: 213 s
Activated Clotting Time: 263 seconds
Activated Clotting Time: 191 seconds
Activated Clotting Time: 175 seconds

## 2016-03-25 LAB — CBC
HCT: 37.5 % (ref 36.0–46.0)
Hemoglobin: 11.8 g/dL — ABNORMAL LOW (ref 12.0–15.0)
MCH: 26.6 pg (ref 26.0–34.0)
MCHC: 31.5 g/dL (ref 30.0–36.0)
MCV: 84.7 fL (ref 78.0–100.0)
PLATELETS: 171 10*3/uL (ref 150–400)
RBC: 4.43 MIL/uL (ref 3.87–5.11)
RDW: 15.9 % — AB (ref 11.5–15.5)
WBC: 5 10*3/uL (ref 4.0–10.5)

## 2016-03-25 LAB — POCT I-STAT 3, VENOUS BLOOD GAS (G3P V)
ACID-BASE DEFICIT: 1 mmol/L (ref 0.0–2.0)
Acid-base deficit: 1 mmol/L (ref 0.0–2.0)
BICARBONATE: 24 mmol/L (ref 20.0–28.0)
BICARBONATE: 24.7 mmol/L (ref 20.0–28.0)
O2 SAT: 55 %
O2 Saturation: 62 %
PCO2 VEN: 42.5 mmHg — AB (ref 44.0–60.0)
PH VEN: 7.372 (ref 7.250–7.430)
PO2 VEN: 33 mmHg (ref 32.0–45.0)
TCO2: 25 mmol/L (ref 0–100)
TCO2: 26 mmol/L (ref 0–100)
pCO2, Ven: 42.5 mmHg — ABNORMAL LOW (ref 44.0–60.0)
pH, Ven: 7.361 (ref 7.250–7.430)
pO2, Ven: 30 mmHg — CL (ref 32.0–45.0)

## 2016-03-25 LAB — HEPARIN LEVEL (UNFRACTIONATED): Heparin Unfractionated: 0.68 IU/mL (ref 0.30–0.70)

## 2016-03-25 SURGERY — RIGHT/LEFT HEART CATH AND CORONARY ANGIOGRAPHY
Anesthesia: LOCAL

## 2016-03-25 MED ORDER — SODIUM CHLORIDE 0.9 % IV SOLN: INTRAVENOUS | Status: AC

## 2016-03-25 MED ORDER — LABETALOL HCL 5 MG/ML IV SOLN
20.0000 mg | Freq: Once | INTRAVENOUS | Status: AC
  Administered 2016-03-25: 20 mg via INTRAVENOUS

## 2016-03-25 MED ORDER — LIDOCAINE HCL (PF) 1 % IJ SOLN
INTRAMUSCULAR | Status: AC
  Filled 2016-03-25: qty 30

## 2016-03-25 MED ORDER — SODIUM CHLORIDE 0.9 % WEIGHT BASED INFUSION
3.0000 mL/kg/h | INTRAVENOUS | Status: DC
  Administered 2016-03-25: 3 mL/kg/h via INTRAVENOUS

## 2016-03-25 MED ORDER — HEPARIN (PORCINE) IN NACL 2-0.9 UNIT/ML-% IJ SOLN
INTRAMUSCULAR | Status: DC | PRN
  Administered 2016-03-25: 1500 mL

## 2016-03-25 MED ORDER — SODIUM CHLORIDE 0.9% FLUSH
3.0000 mL | Freq: Two times a day (BID) | INTRAVENOUS | Status: DC
  Administered 2016-03-25 – 2016-04-03 (×7): 3 mL via INTRAVENOUS

## 2016-03-25 MED ORDER — HEPARIN (PORCINE) IN NACL 2-0.9 UNIT/ML-% IJ SOLN
INTRAMUSCULAR | Status: AC
  Filled 2016-03-25: qty 1500

## 2016-03-25 MED ORDER — LABETALOL HCL 5 MG/ML IV SOLN
INTRAVENOUS | Status: AC
  Filled 2016-03-25: qty 4

## 2016-03-25 MED ORDER — SODIUM CHLORIDE 0.9 % WEIGHT BASED INFUSION
1.0000 mL/kg/h | INTRAVENOUS | Status: DC
  Administered 2016-03-25: 1 mL/kg/h via INTRAVENOUS

## 2016-03-25 MED ORDER — SODIUM CHLORIDE 0.9 % IV SOLN: 250.0000 mL | INTRAVENOUS | Status: DC | PRN

## 2016-03-25 MED ORDER — SODIUM CHLORIDE 0.9% FLUSH: 3.0000 mL | INTRAVENOUS | Status: DC | PRN

## 2016-03-25 MED ORDER — IOPAMIDOL (ISOVUE-370) INJECTION 76%
INTRAVENOUS | Status: AC
  Filled 2016-03-25: qty 50

## 2016-03-25 MED ORDER — FENTANYL CITRATE (PF) 100 MCG/2ML IJ SOLN
INTRAMUSCULAR | Status: AC
  Filled 2016-03-25: qty 2

## 2016-03-25 MED ORDER — VERAPAMIL HCL 2.5 MG/ML IV SOLN
INTRAVENOUS | Status: DC | PRN
  Administered 2016-03-25: 10 mL via INTRA_ARTERIAL

## 2016-03-25 MED ORDER — VERAPAMIL HCL 2.5 MG/ML IV SOLN
INTRAVENOUS | Status: AC
  Filled 2016-03-25: qty 2

## 2016-03-25 MED ORDER — ASPIRIN 81 MG PO CHEW
81.0000 mg | CHEWABLE_TABLET | ORAL | Status: AC
  Administered 2016-03-25: 81 mg via ORAL
  Filled 2016-03-25: qty 1

## 2016-03-25 MED ORDER — ONDANSETRON HCL 4 MG/2ML IJ SOLN: 4.0000 mg | Freq: Four times a day (QID) | INTRAMUSCULAR | Status: DC | PRN

## 2016-03-25 MED ORDER — ACETAMINOPHEN 325 MG PO TABS
650.0000 mg | ORAL_TABLET | ORAL | Status: DC | PRN
  Filled 2016-03-25: qty 2

## 2016-03-25 MED ORDER — SODIUM CHLORIDE 0.9% FLUSH
3.0000 mL | Freq: Two times a day (BID) | INTRAVENOUS | Status: DC
Start: 2016-03-25 — End: 2016-03-25
  Administered 2016-03-25: 3 mL via INTRAVENOUS

## 2016-03-25 MED ORDER — HEPARIN (PORCINE) IN NACL 100-0.45 UNIT/ML-% IJ SOLN: 650.0000 [IU]/h | INTRAMUSCULAR | Status: DC

## 2016-03-25 MED ORDER — LIDOCAINE HCL (PF) 1 % IJ SOLN
INTRAMUSCULAR | Status: DC | PRN
  Administered 2016-03-25: 2 mL
  Administered 2016-03-25: 15 mL
  Administered 2016-03-25: 2 mL

## 2016-03-25 MED ORDER — HEPARIN (PORCINE) IN NACL 100-0.45 UNIT/ML-% IJ SOLN
700.0000 [IU]/h | INTRAMUSCULAR | Status: AC
  Administered 2016-03-25: 650 [IU]/h via INTRAVENOUS
  Administered 2016-03-27: 600 [IU]/h via INTRAVENOUS
  Administered 2016-03-29: 700 [IU]/h via INTRAVENOUS
  Filled 2016-03-25 (×3): qty 250

## 2016-03-25 MED ORDER — IOPAMIDOL (ISOVUE-370) INJECTION 76%
INTRAVENOUS | Status: DC | PRN
  Administered 2016-03-25: 60 mL via INTRA_ARTERIAL

## 2016-03-25 MED ORDER — MIDAZOLAM HCL 2 MG/2ML IJ SOLN
INTRAMUSCULAR | Status: DC | PRN
  Administered 2016-03-25: 1 mg via INTRAVENOUS

## 2016-03-25 MED ORDER — MIDAZOLAM HCL 2 MG/2ML IJ SOLN
INTRAMUSCULAR | Status: AC
  Filled 2016-03-25: qty 2

## 2016-03-25 MED ORDER — HEPARIN SODIUM (PORCINE) 1000 UNIT/ML IJ SOLN
INTRAMUSCULAR | Status: DC | PRN
  Administered 2016-03-25: 4000 [IU] via INTRAVENOUS

## 2016-03-25 MED ORDER — FENTANYL CITRATE (PF) 100 MCG/2ML IJ SOLN
INTRAMUSCULAR | Status: DC | PRN
  Administered 2016-03-25: 25 ug via INTRAVENOUS

## 2016-03-25 SURGICAL SUPPLY — 15 items
CATH 5FR JL3.5 JR4 ANG PIG MP (CATHETERS) ×2 IMPLANT
CATH BALLN WEDGE 5F 110CM (CATHETERS) ×2 IMPLANT
CATH SWAN GANZ 7F STRAIGHT (CATHETERS) ×2 IMPLANT
DEVICE RAD COMP TR BAND LRG (VASCULAR PRODUCTS) ×2 IMPLANT
GLIDESHEATH SLEND SS 6F .021 (SHEATH) ×4 IMPLANT
GUIDEWIRE .025 260CM (WIRE) ×2 IMPLANT
GUIDEWIRE INQWIRE 1.5J.035X260 (WIRE) ×1 IMPLANT
INQWIRE 1.5J .035X260CM (WIRE) ×2
KIT HEART LEFT (KITS) ×2 IMPLANT
PACK CARDIAC CATHETERIZATION (CUSTOM PROCEDURE TRAY) ×2 IMPLANT
SHEATH FAST CATH BRACH 5F 5CM (SHEATH) ×2 IMPLANT
SHEATH PINNACLE 7F 10CM (SHEATH) ×2 IMPLANT
SYR MEDRAD MARK V 150ML (SYRINGE) ×2 IMPLANT
TRANSDUCER W/STOPCOCK (MISCELLANEOUS) ×4 IMPLANT
TUBING CIL FLEX 10 FLL-RA (TUBING) ×2 IMPLANT

## 2016-03-25 NOTE — Progress Notes (Signed)
Received patient from cath lab. Best rest began at 1715, will end at 44. Patient oriented to room, vitals stable, sites level 0. Will continue to monitor.  Cyndia Bent

## 2016-03-25 NOTE — Progress Notes (Signed)
Patient Name: Marie Malone Date of Encounter: 03/25/2016  Primary Cardiologist: [facility] - Closed Problem List     Principal Problem:   Transient alteration of awareness Active Problems:   HTN (hypertension)   Atrial fibrillation (HCC)   HLD (hyperlipidemia)   Asymptomatic stenosis of right carotid artery s/p right CEA 03/10/16   Constipation   Altered mental status   Tongue deviation     Subjective   Feels well. No problems after TEE. No syncope or dyspnea.  Inpatient Medications    Scheduled Meds: . aspirin  81 mg Oral Pre-Cath  . atorvastatin  80 mg Oral q1800  . docusate sodium  100 mg Oral BID  . metoprolol succinate  25 mg Oral Daily  . polyethylene glycol  17 g Oral Daily  . sodium chloride flush  3 mL Intravenous Q12H   Continuous Infusions: . sodium chloride     Followed by  . sodium chloride    . heparin 650 Units/hr (03/25/16 0806)   PRN Meds: sodium chloride, acetaminophen, sodium chloride flush   Vital Signs    Vitals:   03/24/16 1403 03/24/16 1412 03/24/16 2234 03/25/16 0547  BP: 115/67 123/66 119/67 120/64  Pulse: (!) 104 (!) 102 (!) 104 86  Resp: 17 16 18 18   Temp: 97.8 F (36.6 C)  98.4 F (36.9 C) 98.4 F (36.9 C)  TempSrc: Oral Oral Oral Oral  SpO2: 100% 99% 99% 98%  Weight:      Height:        Intake/Output Summary (Last 24 hours) at 03/25/16 1018 Last data filed at 03/24/16 1504  Gross per 24 hour  Intake            61.09 ml  Output                0 ml  Net            61.09 ml   Filed Weights   03/21/16 1504 03/24/16 1140  Weight: 160 lb 6.4 oz (72.8 kg) 160 lb 6.4 oz (72.8 kg)    Physical Exam  Comfortable at rest GEN: Well nourished, well developed, in no acute distress.  HEENT: Grossly normal.  Neck: Supple, no JVD, carotid bruits, or masses. Right endarterectomy scar Cardiac: RRR, early and loud opening snap, very faint apical rumble, no rubs or gallops. No clubbing, cyanosis, edema.  Radials/DP/PT 2+ and  equal bilaterally.  Respiratory:  Respirations regular and unlabored, clear to auscultation bilaterally. GI: Soft, nontender, nondistended, BS + x 4. MS: no deformity or atrophy. Skin: warm and dry, no rash. Neuro:  Strength and sensation are intact. Psych: AAOx3.  Normal affect.  Labs    CBC  Recent Labs  03/24/16 0527 03/25/16 0621  WBC 6.5 5.0  HGB 12.1 11.8*  HCT 38.6 37.5  MCV 85.0 84.7  PLT 172 171     Telemetry    SR/mild sinus tachycardia with second degree AV block Mobitz type 1 - Personally Reviewed  Cardiac Studies   TEE 11/15  - Left ventricle: Systolic function was normal. The estimated ejection fraction was in the range of 55% to 60%. - Aortic valve: Left coronary cusp mobility was mildly restricted. No evidence of vegetation. - Mitral valve: Mobility of the posterior leaflet was moderately restricted. The findings are consistent with moderate to severe stenosis. There was trivial regurgitation. Mean gradient (D): 11 mm Hg. Valve area by pressure half-time: 1.25 cm^2. Valve area by   continuity equation (using LVOT  flow): 0.77 cm^2. - Left atrium: The atrium was dilated. No evidence of thrombus in the appendage. No evidence of thrombus in the atrial cavity or appendage.  Patient Profile     65 year old woman with moderate to severe mitral stenosis following commissurotomy 2008, s/p recent R CEA with recurrent syncope, possibly explained by orthostatic hypotension, concern for possible AF related syncope.  Assessment & Plan    1. Syncope: no recurrence since admission. Probably orthostatic, but some concern for AFib/MS related syncope. AF documented in past, none since this admission. Has second degree AV block, Mobitz type 1 on beta blocker in current dose, but without bradycardia or lengthy pauses. 2. Moderate (to severe?) MS: gradients are high on TEE as well. Beta blocker ose limited by Wenckebach block. Plan R and L heart cath today. I am particularly  concerned that we may be underestimating the PA pressure. This procedure has been fully reviewed with the patient and written informed consent has been obtained. 3. PAFib: documented in past. 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 Warfarin on hold for possible cath. 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 On IV heparin. Will nee transition back to warfarin before DC. 4. Sinus tachycardia: Will maintain current low dose beta blocker notwithstanding the problems with orthostatic hypotension and second degree AV block. 5. Recent R CEA:  Poor postop PO intake and maybe neurovegetative impact of carotid sinus manipulation may have contributed to recent labile BP.  Signed, [name], MD  03/25/2016, 10:18 AM

## 2016-03-25 NOTE — Progress Notes (Signed)
ANTICOAGULATION CONSULT NOTE - Follow up [place] for Heparin Indication: atrial fibrillation  Vital Signs: Temp: 98.4 F (36.9 C) (11/16 0547) Temp Source: Oral (11/16 0547) BP: 120/64 (11/16 0547) Pulse Rate: 86 (11/16 0547)  Labs:  Recent Labs  03/22/16 0905 03/23/16 0523  03/24/16 0527 03/24/16 1501 03/24/16 2042 03/25/16 0621  HGB 12.3  --   --  12.1  --   --  11.8*  HCT 39.5  --   --  38.6  --   --  37.5  PLT 243  --   --  172  --   --  171  LABPROT 21.6* 22.5*  --  21.2*  --   --  19.9*  INR 1.85 1.95  --  1.81  --   --  1.67  HEPARINUNFRC  --   --   < > 0.84* 0.68 0.79* 0.68  CREATININE 0.83  --   --   --   --   --   --   < > = values in this interval not displayed.  Estimated Creatinine Clearance: 64 mL/min (by C-G formula based on SCr of 0.83 mg/dL).  Assessment: 65 y.o. female with h/o Afib, warfarin on hold, heparin started in preparation for cath. Heparin level this morning is therapeutic at 0.68. CBC stable. No bleeding noted. Will decrease rate slightly to try and keep in range.   Goal of Therapy:  Heparin level = 0.3-0.7 Monitor platelets by anticoagulation protocol: Yes   Plan:  Decrease IV heparin to 650 units/hr Check anti-Xa level in 8 hours and daily while on heparin Continue to monitor H&H and platelets F/u need for heparin post cath at 1200   Thank you for allowing [place] to participate in this patients care. Jens Som, PharmD Pager: [phone] 8:06 AM, 03/25/2016

## 2016-03-25 NOTE — Progress Notes (Signed)
Site area: rt groin Site Prior to Removal:  Level 0 Pressure Applied For:  15 minutes Manual:   yes Patient Status During Pull:  stable Post Pull Site:  Level 0 Post Pull Instructions Given:  yes Post Pull Pulses Present: yes Dressing Applied:  Gauze/tegaderm Bedrest begins @ 4034 Comments:

## 2016-03-25 NOTE — Progress Notes (Signed)
PROGRESS NOTE        PATIENT DETAILS Name: Marie Malone Age: 65 y.o. Sex: female Date of Birth: [birth year] Admit Date: 03/21/2016 Admitting Physician Shon Millet, DO PCP:O'SULLIVAN,MELISSA S., NP  Brief Narrative: Patient is a 65 y.o. female with history of recent R carotid endarterectomy 03/10/2016, Afib on coumadin, prior CVA, rheumatic mitral stenosis s/p valvuloplasty 2008, pulmonary HTN, dyslipidemia, HTN. Patient was admitted to the hospital 11/10-11/11 after a "staring" spell and was discharged with a negative work-up and symptom resolution. She presented again to the ED 11/12 after 2 episodes of syncope upon standing the night before. She was admitted with a working diagnosis of orthostatic hypotension. Neurology and Cardiology consulted.  Subjective: Patient is ambulating to the bathroom free of symptoms. She denies and current symptoms or new complaints. No acute distress.  Denies chest pain, sob, cough, wheeze, abdominal pain, nausea, vomiting, dizziness.  Assessment/Plan: Altered mentation / syncope / orthostasis vs seizure: prior admission with negative work-up for orthostatic etiology. Symptoms resolved with IVF, discharged home. Re-admitted with 2 syncopal episodes and altered mental status but no loss of consciousness. Neurology believes these episodes to be more consistent with orthostasis than seizure. EEG normal. Orthostatic vital signs positive on admission. Amlodipine and metoprolol were initially placed on hold. ECHO showed mild LVH, calcified AV without AS, calcified MV with probable sever MS, LAE. TEE confirmed high grade MS. Cardiology is concerned that PA pressure may be underestimated and has planned a R and L heart cath today. Start compression stockings, and recheck orthostatic vital signs tomorrow morning.  Paroxysmal atrial fibrillation on coumadin: currently in sinus rhythm with first-degree AV block. Cardiology noted  second degree AV block, Mobitz type 1 without bradycardia or lengthy pauses. Remains on low-dose beta blocker Coumadin on hold for cath. Pharmacy dosing Heparin. Continue cardiac monitoring.  HTN: well controlled 120/64 this am. Continue to hold norvasc.   History of CVA / Asymptomatic stenosis of right carotid artery s/p right CEA 03/10/16: CTAhead and neck on 11/10 with patent right carotid endarterecomy site. Hematoma at operative site. Currently stable. Follow up with vascular surgery outpatient. Of note, Cardiology suggests the possibility that carotid sinus manipulation may be a contributor to recent labile BP.  Constipation: Last BM 11/12 per shift assessment record. No symptoms reported. Continue Colace BID and MiraLax daily.  Mitral stenosis/Mildpulmonary hypertension: ECHO 12/2015 showed normal EF with severe diffuse thickening and calcification of aortic valve, prior mitral valvuloplasty with severe thickened mitral valve leaflets and associated moderate mitral stenosis which has progressed since 2016. Pulmonary hypertension was characterized as mild with associated increase in right ventricular systolic pressure. ECHO 03/23/2016 showed mild LVH, calcified AV without AS, calcified MV with probable sever MS, LAE. TEE planned for today to further evaluate MV. TEE confirmed high grade MS. Catheterization planned for today.  HLD: continue Lipitor.  DVT Prophylaxis: Heparin - pharmacy to dose  Code Status: Full code  Family Communication: Family at bedside  Disposition Plan: Pending further evaluation  Antimicrobial agents: None  Procedures: EEG ECHO TEE Catheterization  CONSULTS: Neurology Cardiology  Time spent: 25 minutes  MEDICATIONS: Anti-infectives    None      Scheduled Meds: . atorvastatin  80 mg Oral q1800  . docusate sodium  100 mg Oral BID  . metoprolol succinate  25 mg Oral Daily  . polyethylene glycol  17  g Oral Daily  . sodium  chloride flush  3 mL Intravenous Q12H   Continuous Infusions: . sodium chloride 1 mL/kg/hr (03/25/16 1130)  . heparin 650 Units/hr (03/25/16 0806)   PRN Meds:.sodium chloride, acetaminophen, sodium chloride flush   PHYSICAL EXAM: Vital signs: Vitals:   03/24/16 1403 03/24/16 1412 03/24/16 2234 03/25/16 0547  BP: 115/67 123/66 119/67 120/64  Pulse: (!) 104 (!) 102 (!) 104 86  Resp: 17 16 18 18   Temp: 97.8 F (36.6 C)  98.4 F (36.9 C) 98.4 F (36.9 C)  TempSrc: Oral Oral Oral Oral  SpO2: 100% 99% 99% 98%  Weight:      Height:       Filed Weights   03/21/16 1504 03/24/16 1140  Weight: 72.8 kg (160 lb 6.4 oz) 72.8 kg (160 lb 6.4 oz)   Body mass index is 29.34 kg/m.   General appearance: Awake, alert, not in any distress. Speech Clear. Not toxic Looking Eyes: pupils equally reactive to light and accomodation,no scleral icterus.Pink conjunctiva HEENT: Atraumatic and Normocephalic Neck: supple, no JVD. No cervical lymphadenopathy. No thyromegaly. Right endarterectomy surgical site. Wound appears clean and dry. Resp:Good air entry bilaterally, no added sounds  CVS: S1 S2 regular, no murmurs appreciated.  GI: Bowel sounds present, Non tender and not distended with no gaurding, rigidity or rebound.No organomegaly Extremities: B/L Lower Ext shows no edema, both legs are warm to touch Neurology:  speech clear,Non focal, sensation is grossly intact. Psychiatric: Normal judgment and insight. Alert and oriented x 3. Normal mood. Musculoskeletal: No digital cyanosis Skin:No Rash, warm and dry Wounds: Right endarterectomy surgical site.  I have personally reviewed following labs and imaging studies  LABORATORY DATA: CBC:  Recent Labs Lab 03/19/16 0318 03/21/16 0641 03/22/16 0905 03/24/16 0527 03/25/16 0621  WBC 5.6 6.5 4.4 6.5 5.0  NEUTROABS 3.4  --   --   --   --   HGB 11.9* 11.5* 12.3 12.1 11.8*  HCT 37.6 36.9 39.5 38.6 37.5  MCV 84.3 84.4 84.6 85.0 84.7  PLT 183 237  243 172 939    Basic Metabolic Panel:  Recent Labs Lab 03/19/16 0318 03/19/16 0420 03/19/16 1028 03/20/16 0445 03/21/16 0641 03/22/16 0905  NA 138  --   --  139 137 139  K 3.1*  --   --  5.3* 3.9 3.7  CL 103  --   --  107 104 104  CO2 22  --   --  24 22 25   GLUCOSE 143*  --   --  93 101* 103*  BUN 12  --   --  13 16 15   CREATININE 0.95  --   --  0.75 0.82 0.83  CALCIUM 9.7  --   --  9.6 9.7 9.6  MG  --  2.0  --   --   --   --   PHOS  --   --  3.7  --   --   --     GFR: Estimated Creatinine Clearance: 64 mL/min (by C-G formula based on SCr of 0.83 mg/dL).  Liver Function Tests:  Recent Labs Lab 03/19/16 0318  AST 28  ALT 24  ALKPHOS 75  BILITOT 1.2  PROT 8.5*  ALBUMIN 3.7   No results for input(s): LIPASE, AMYLASE in the last 168 hours. No results for input(s): AMMONIA in the last 168 hours.  Coagulation Profile:  Recent Labs Lab 03/21/16 0641 03/22/16 0905 03/23/16 0523 03/24/16 0527 03/25/16 0621  INR 2.26  1.85 1.95 1.81 1.67    Cardiac Enzymes: No results for input(s): CKTOTAL, CKMB, CKMBINDEX, TROPONINI in the last 168 hours.  BNP (last 3 results)  Recent Labs  10/10/15 1343 01/20/16 1539  PROBNP 238.0* 136.0*    HbA1C: No results for input(s): HGBA1C in the last 72 hours.  CBG:  Recent Labs Lab 03/21/16 0707  GLUCAP 89    Lipid Profile: No results for input(s): CHOL, HDL, LDLCALC, TRIG, CHOLHDL, LDLDIRECT in the last 72 hours.  Thyroid Function Tests: No results for input(s): TSH, T4TOTAL, FREET4, T3FREE, THYROIDAB in the last 72 hours.  Anemia Panel: No results for input(s): VITAMINB12, FOLATE, FERRITIN, TIBC, IRON, RETICCTPCT in the last 72 hours.  Urine analysis:    Component Value Date/Time   COLORURINE YELLOW 03/21/2016 0830   APPEARANCEUR CLEAR 03/21/2016 0830   LABSPEC 1.010 03/21/2016 0830   PHURINE 7.0 03/21/2016 0830   GLUCOSEU NEGATIVE 03/21/2016 0830   GLUCOSEU NEGATIVE 07/22/2015 1444   HGBUR NEGATIVE  03/21/2016 0830   BILIRUBINUR NEGATIVE 03/21/2016 0830   KETONESUR NEGATIVE 03/21/2016 0830   PROTEINUR NEGATIVE 03/21/2016 0830   UROBILINOGEN 0.2 07/22/2015 1444   NITRITE NEGATIVE 03/21/2016 0830   LEUKOCYTESUR NEGATIVE 03/21/2016 0830    Sepsis Labs: Lactic Acid, Venous No results found for: LATICACIDVEN  MICROBIOLOGY: No results found for this or any previous visit (from the past 240 hour(s)).  RADIOLOGY STUDIES/RESULTS: Ct Angio Head W Or Wo Contrast  Result Date: 03/19/2016 CLINICAL DATA:  Acute onset mental status change. EXAM: CT ANGIOGRAPHY HEAD AND NECK TECHNIQUE: Multidetector CT imaging of the head and neck was performed using the standard protocol during bolus administration of intravenous contrast. Multiplanar CT image reconstructions and MIPs were obtained to evaluate the vascular anatomy. Carotid stenosis measurements (when applicable) are obtained utilizing NASCET criteria, using the distal internal carotid diameter as the denominator. CONTRAST:  50 mL Isovue 370 IV COMPARISON:  CTA head and neck 01/29/2016 FINDINGS: CT HEAD FINDINGS Brain: There is an old left cerebellar infarct. There is left MCA distribution encephalomalacia at the site of prior infarct. No CT evidence of acute cortical infarct. No acute hemorrhage. No midline shift or mass effect. No extra-axial collection. Vascular: No hyperdense vessel or unexpected calcification. Skull: Normal. Negative for fracture or focal lesion. Sinuses: Imaged portions are clear. Orbits: Normal. Review of the MIP images confirms the above findings CTA NECK FINDINGS Aortic arch: There is atherosclerotic calcification within the aortic arch. There is a normal branching pattern. The subclavian arteries are normal proximally. There is atherosclerotic calcification within the proximal arch vessels. Right carotid system: There is mild narrowing of the right common carotid artery at the level of the thyroid secondary to mass effect from  adjacent soft tissue. Otherwise, no hemodynamically significant stenosis. The right internal carotid artery is normal. Left carotid system: There is mild atherosclerotic calcification of the left carotid bifurcation without hemodynamically significant stenosis. Vertebral arteries: There is narrowing of the right vertebral artery origin secondary to atherosclerotic calcification. There is mild calcification of the left vertebral artery origin without significant stenosis. The vertebral system is right dominant. The V2, V3 and V4 segments of the vertebral arteries are normal to the confluence with the basilar artery. Skeleton: There is no bony spinal canal stenosis. No lytic or blastic lesions. Other neck: There is marked soft tissue prominence within the right carotid space, which is likely secondary to recent right carotid endarterectomy performed March 10, 2016. Upper chest: There is a ground-glass opacity measuring 4 mm in  the left upper lobe. Review of the MIP images confirms the above findings CTA HEAD FINDINGS Anterior circulation: --Intracranial internal carotid arteries: Mild calcification of the internal carotid arteries at the skullbase. --Anterior cerebral arteries: Normal. --Middle cerebral arteries: Normal. --Posterior communicating arteries: Absent bilaterally. Posterior circulation: --Posterior cerebral arteries: Normal. --Superior cerebellar arteries: Normal. --Basilar artery: Normal. --Anterior inferior cerebellar arteries: Not visualized, which is not uncommon. --Posterior inferior cerebellar arteries: Normal. Venous sinuses: As permitted by contrast timing, patent. Anatomic variants: None. Delayed phase: No abnormal parenchymal enhancement. Review of the MIP images confirms the above findings IMPRESSION: 1. No acute intracranial abnormality. 2. 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 3. Normal CTA of the circle of Willis and intracranial arteries. 4. Status post right carotid  endarterectomy with greatly improved patency. 5. No hemodynamically significant stenosis of the left carotid system. Electronically Signed   By: Ulyses Jarred M.D.   On: 03/19/2016 06:27   Dg Abd 1 View  Result Date: 03/19/2016 CLINICAL DATA:  Constipation. EXAM: ABDOMEN - 1 VIEW COMPARISON:  No recent prior. FINDINGS: Soft tissue structures are unremarkable. Ill-defined radiopacity is noted stomach, this may represent debris within the stomach. Stool noted throughout colon suggests constipation. No bowel distention. No free air. Degenerative changes lumbar spine and both hips. Aortoiliac atherosclerotic vascular calcification. Surgical clips sutures in the pelvis. IMPRESSION: No acute abnormality identified. Prominent amount of stool in the colon suggesting constipation. Electronically Signed   By: Marcello Moores  Register   On: 03/19/2016 09:56   Ct Head Wo Contrast  Result Date: 03/21/2016 CLINICAL DATA:  Frontal and right-sided headache after dizziness and fall. Right-sided neck pain. Carotid endarterectomy March 10, 2016 EXAM: CT HEAD WITHOUT CONTRAST CT CERVICAL SPINE WITHOUT CONTRAST TECHNIQUE: Multidetector CT imaging of the head and cervical spine was performed following the standard protocol without intravenous contrast. Multiplanar CT image reconstructions of the cervical spine were also generated. COMPARISON:  March 19, 2016 CT scan FINDINGS: CT HEAD FINDINGS Brain: No subdural, epidural, or subarachnoid hemorrhage. Small infarct in the left cerebellar hemisphere, unchanged. Left parietal infarct with encephalomalacia, unchanged. No acute cortical ischemia or infarct. Ventricles and sulci are stable. Brainstem and basal cisterns are normal. No mass, mass effect, or midline shift. Vascular: Calcified atherosclerosis in the intracranial portions of the carotid arteries. Skull: Normal. Negative for fracture or focal lesion. Sinuses/Orbits: No acute finding. Other: None. CT CERVICAL SPINE FINDINGS  Alignment: Straightening of normal lordosis with no traumatic malalignment. Skull base and vertebrae: No fractures. Soft tissues and spinal canal: Hematoma in the right side of the neck from recent right carotid endarterectomy exerts mass effect on the right thyroid lobe. The CTA of the neck from March 19, 2016 demonstrated mild narrowing of the carotid artery as it passed posterior to the hematoma. The carotid artery is not well assessed in this region today. The hematoma is similar in size today. No other acute soft tissue abnormalities. Disc levels: Multilevel degenerative changes with anterior and posterior osteophytes. Partial calcification of the posterior longitudinal ligament. The posterior osteophytes are most prominent at T1-2 and T2-3 with narrowing of the spinal canal at these levels. At T1-2, the spinal canal measures 7 mm in maximum diameter. Upper chest: Negative. Other: No other abnormalities. IMPRESSION: 1. No acute intracranial process. Left cerebellar and left parietal infarcts, chronic. 2. No fracture or traumatic malalignment in the cervical spine. 3. Degenerative changes. Partial calcification of the posterior longitudinal ligament. Narrowing of the spinal canal due to these findings, most marked  at T1-2 and T2-3 with an AP diameter of the spinal canal at T1-2 of 7 mm. 4. Hematoma in the right side of the neck from recent surgery. Electronically Signed   By: Dorise Bullion III M.D   On: 03/21/2016 09:50   Ct Angio Neck W And/or Wo Contrast  Result Date: 03/19/2016 CLINICAL DATA:  Acute onset mental status change. EXAM: CT ANGIOGRAPHY HEAD AND NECK TECHNIQUE: Multidetector CT imaging of the head and neck was performed using the standard protocol during bolus administration of intravenous contrast. Multiplanar CT image reconstructions and MIPs were obtained to evaluate the vascular anatomy. Carotid stenosis measurements (when applicable) are obtained utilizing NASCET criteria, using the  distal internal carotid diameter as the denominator. CONTRAST:  50 mL Isovue 370 IV COMPARISON:  CTA head and neck 01/29/2016 FINDINGS: CT HEAD FINDINGS Brain: There is an old left cerebellar infarct. There is left MCA distribution encephalomalacia at the site of prior infarct. No CT evidence of acute cortical infarct. No acute hemorrhage. No midline shift or mass effect. No extra-axial collection. Vascular: No hyperdense vessel or unexpected calcification. Skull: Normal. Negative for fracture or focal lesion. Sinuses: Imaged portions are clear. Orbits: Normal. Review of the MIP images confirms the above findings CTA NECK FINDINGS Aortic arch: There is atherosclerotic calcification within the aortic arch. There is a normal branching pattern. The subclavian arteries are normal proximally. There is atherosclerotic calcification within the proximal arch vessels. Right carotid system: There is mild narrowing of the right common carotid artery at the level of the thyroid secondary to mass effect from adjacent soft tissue. Otherwise, no hemodynamically significant stenosis. The right internal carotid artery is normal. Left carotid system: There is mild atherosclerotic calcification of the left carotid bifurcation without hemodynamically significant stenosis. Vertebral arteries: There is narrowing of the right vertebral artery origin secondary to atherosclerotic calcification. There is mild calcification of the left vertebral artery origin without significant stenosis. The vertebral system is right dominant. The V2, V3 and V4 segments of the vertebral arteries are normal to the confluence with the basilar artery. Skeleton: There is no bony spinal canal stenosis. No lytic or blastic lesions. Other neck: There is marked soft tissue prominence within the right carotid space, which is likely secondary to recent right carotid endarterectomy performed March 10, 2016. Upper chest: There is a ground-glass opacity measuring 4 mm  in the left upper lobe. Review of the MIP images confirms the above findings CTA HEAD FINDINGS Anterior circulation: --Intracranial internal carotid arteries: Mild calcification of the internal carotid arteries at the skullbase. --Anterior cerebral arteries: Normal. --Middle cerebral arteries: Normal. --Posterior communicating arteries: Absent bilaterally. Posterior circulation: --Posterior cerebral arteries: Normal. --Superior cerebellar arteries: Normal. --Basilar artery: Normal. --Anterior inferior cerebellar arteries: Not visualized, which is not uncommon. --Posterior inferior cerebellar arteries: Normal. Venous sinuses: As permitted by contrast timing, patent. Anatomic variants: None. Delayed phase: No abnormal parenchymal enhancement. Review of the MIP images confirms the above findings IMPRESSION: 1. No acute intracranial abnormality. 2. 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 3. Normal CTA of the circle of Willis and intracranial arteries. 4. Status post right carotid endarterectomy with greatly improved patency. 5. No hemodynamically significant stenosis of the left carotid system. Electronically Signed   By: Ulyses Jarred M.D.   On: 03/19/2016 06:27   Ct Cervical Spine Wo Contrast  Result Date: 03/21/2016 CLINICAL DATA:  Frontal and right-sided headache after dizziness and fall. Right-sided neck pain. Carotid endarterectomy March 10, 2016 EXAM: CT HEAD  WITHOUT CONTRAST CT CERVICAL SPINE WITHOUT CONTRAST TECHNIQUE: Multidetector CT imaging of the head and cervical spine was performed following the standard protocol without intravenous contrast. Multiplanar CT image reconstructions of the cervical spine were also generated. COMPARISON:  March 19, 2016 CT scan FINDINGS: CT HEAD FINDINGS Brain: No subdural, epidural, or subarachnoid hemorrhage. Small infarct in the left cerebellar hemisphere, unchanged. Left parietal infarct with encephalomalacia, unchanged. No acute cortical  ischemia or infarct. Ventricles and sulci are stable. Brainstem and basal cisterns are normal. No mass, mass effect, or midline shift. Vascular: Calcified atherosclerosis in the intracranial portions of the carotid arteries. Skull: Normal. Negative for fracture or focal lesion. Sinuses/Orbits: No acute finding. Other: None. CT CERVICAL SPINE FINDINGS Alignment: Straightening of normal lordosis with no traumatic malalignment. Skull base and vertebrae: No fractures. Soft tissues and spinal canal: Hematoma in the right side of the neck from recent right carotid endarterectomy exerts mass effect on the right thyroid lobe. The CTA of the neck from March 19, 2016 demonstrated mild narrowing of the carotid artery as it passed posterior to the hematoma. The carotid artery is not well assessed in this region today. The hematoma is similar in size today. No other acute soft tissue abnormalities. Disc levels: Multilevel degenerative changes with anterior and posterior osteophytes. Partial calcification of the posterior longitudinal ligament. The posterior osteophytes are most prominent at T1-2 and T2-3 with narrowing of the spinal canal at these levels. At T1-2, the spinal canal measures 7 mm in maximum diameter. Upper chest: Negative. Other: No other abnormalities. IMPRESSION: 1. No acute intracranial process. Left cerebellar and left parietal infarcts, chronic. 2. No fracture or traumatic malalignment in the cervical spine. 3. Degenerative changes. Partial calcification of the posterior longitudinal ligament. Narrowing of the spinal canal due to these findings, most marked at T1-2 and T2-3 with an AP diameter of the spinal canal at T1-2 of 7 mm. 4. Hematoma in the right side of the neck from recent surgery. Electronically Signed   By: Dorise Bullion III M.D   On: 03/21/2016 09:50   Mr Brain Wo Contrast  Result Date: 03/22/2016 CLINICAL DATA:  New tongue deviation to the right. Prior left MCA stroke. Multiple recent  syncopal episodes. EXAM: MRI HEAD WITHOUT CONTRAST TECHNIQUE: Multiplanar, multiecho pulse sequences of the brain and surrounding structures were obtained without intravenous contrast. COMPARISON:  Head CT 03/21/2016 and MRI 04/06/2013 FINDINGS: Brain: There is no evidence of acute infarct, mass, midline shift, or extra-axial fluid collection. A chronic left MCA infarct is again seen per my early involving the parietal lobe with a small amount of chronic blood products. There is ex vacuo dilatation of the left lateral ventricle. Small focus of cortical encephalomalacia at the level of the posterior right insula is unchanged. Small foci of scattered T2 hyperintensity in both cerebral hemispheres are similar to the prior MRI and nonspecific but compatible with mild chronic small vessel ischemic disease. Small chronic infarcts are again seen in the basal ganglia bilaterally and left cerebellum. Vascular: Major intracranial vascular flow voids are preserved. Skull and upper cervical spine: Skull hyperostosis. No suspicious osseous lesion. Sinuses/Orbits: Unremarkable orbits. Trace left mastoid effusion. No significant paranasal sinus disease. Other: None. IMPRESSION: 1. No acute intracranial abnormality. 2. Chronic left MCA infarct. 3. Chronic small vessel ischemic changes as above. Electronically Signed   By: Logan Bores M.D.   On: 03/22/2016 13:02     LOS: 2 days   Filiberto Pinks, PA-S  [facility] Pager:[phone]  If  7PM-7AM, please contact night-coverage [email or web address] Password [facility] 03/25/2016, 11:48 AM  Attending MD note  Patient was seen, examined,treatment plan was discussed with the PA-S. I have personally reviewed the clinical findings, lab, imaging studies and management of this patient in detail. I agree with the documentation, as recorded by the PA-S.   Patient is not able to ambulate to the bathroom without any significant symptoms. She is scheduled for a right and left-sided  heart catheterization today.  On Exam: Gen. exam: Awake, alert, not in any distress Chest: Good air entry bilaterally, no rhonchi or rales CVS: S1-S2 regular, no murmurs Abdomen: Soft, nontender and nondistended Neurology: Non-focal Skin: No rash or lesions  Impression: Orthostatic hypertension: Place [place]-recheck orthostatics tomorrow morning. Moderate to severe MS Paroxysmal atrial fibrillation  Mobitz type I second degree heart block  Plan Continue telemetry monitoring Compression stockings Await further recommendations from cardiology  Rest as above  [facility]

## 2016-03-25 NOTE — H&P (View-Only) (Signed)
Patient Name: Marie Malone Date of Encounter: 03/25/2016  Primary Cardiologist: [facility] Problem List     Principal Problem:   Transient alteration of awareness Active Problems:   HTN (hypertension)   Atrial fibrillation (HCC)   HLD (hyperlipidemia)   Asymptomatic stenosis of right carotid artery s/p right CEA 03/10/16   Constipation   Altered mental status   Tongue deviation     Subjective   Feels well. No problems after TEE. No syncope or dyspnea.  Inpatient Medications    Scheduled Meds: . aspirin  81 mg Oral Pre-Cath  . atorvastatin  80 mg Oral q1800  . docusate sodium  100 mg Oral BID  . metoprolol succinate  25 mg Oral Daily  . polyethylene glycol  17 g Oral Daily  . sodium chloride flush  3 mL Intravenous Q12H   Continuous Infusions: . sodium chloride     Followed by  . sodium chloride    . heparin 650 Units/hr (03/25/16 0806)   PRN Meds: sodium chloride, acetaminophen, sodium chloride flush   Vital Signs    Vitals:   03/24/16 1403 03/24/16 1412 03/24/16 2234 03/25/16 0547  BP: 115/67 123/66 119/67 120/64  Pulse: (!) 104 (!) 102 (!) 104 86  Resp: 17 16 18 18   Temp: 97.8 F (36.6 C)  98.4 F (36.9 C) 98.4 F (36.9 C)  TempSrc: Oral Oral Oral Oral  SpO2: 100% 99% 99% 98%  Weight:      Height:        Intake/Output Summary (Last 24 hours) at 03/25/16 1018 Last data filed at 03/24/16 1504  Gross per 24 hour  Intake            61.09 ml  Output                0 ml  Net            61.09 ml   Filed Weights   03/21/16 1504 03/24/16 1140  Weight: 160 lb 6.4 oz (72.8 kg) 160 lb 6.4 oz (72.8 kg)    Physical Exam  Comfortable at rest GEN: Well nourished, well developed, in no acute distress.  HEENT: Grossly normal.  Neck: Supple, no JVD, carotid bruits, or masses. Right endarterectomy scar Cardiac: RRR, early and loud opening snap, very faint apical rumble, no rubs or gallops. No clubbing, cyanosis, edema.  Radials/DP/PT 2+ and  equal bilaterally.  Respiratory:  Respirations regular and unlabored, clear to auscultation bilaterally. GI: Soft, nontender, nondistended, BS + x 4. MS: no deformity or atrophy. Skin: warm and dry, no rash. Neuro:  Strength and sensation are intact. Psych: AAOx3.  Normal affect.  Labs    CBC  Recent Labs  03/24/16 0527 03/25/16 0621  WBC 6.5 5.0  HGB 12.1 11.8*  HCT 38.6 37.5  MCV 85.0 84.7  PLT 172 171     Telemetry    SR/mild sinus tachycardia with second degree AV block Mobitz type 1 - Personally Reviewed  Cardiac Studies   TEE 11/15  - Left ventricle: Systolic function was normal. The estimated ejection fraction was in the range of 55% to 60%. - Aortic valve: Left coronary cusp mobility was mildly restricted. No evidence of vegetation. - Mitral valve: Mobility of the posterior leaflet was moderately restricted. The findings are consistent with moderate to severe stenosis. There was trivial regurgitation. Mean gradient (D): 11 mm Hg. Valve area by pressure half-time: 1.25 cm^2. Valve area by   continuity equation (using LVOT  flow): 0.77 cm^2. - Left atrium: The atrium was dilated. No evidence of thrombus in the appendage. No evidence of thrombus in the atrial cavity or appendage.  Patient Profile     65 year old woman with moderate to severe mitral stenosis following commissurotomy 2008, s/p recent R CEA with recurrent syncope, possibly explained by orthostatic hypotension, concern for possible AF related syncope.  Assessment & Plan    1. Syncope: no recurrence since admission. Probably orthostatic, but some concern for AFib/MS related syncope. AF documented in past, none since this admission. Has second degree AV block, Mobitz type 1 on beta blocker in current dose, but without bradycardia or lengthy pauses. 2. Moderate (to severe?) MS: gradients are high on TEE as well. Beta blocker ose limited by Wenckebach block. Plan R and L heart cath today. I am particularly  concerned that we may be underestimating the PA pressure. This procedure has been fully reviewed with the patient and written informed consent has been obtained. 3. PAFib: documented in past. 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 Warfarin on hold for possible cath. 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 On IV heparin. Will nee transition back to warfarin before DC. 4. Sinus tachycardia: Will maintain current low dose beta blocker notwithstanding the problems with orthostatic hypotension and second degree AV block. 5. Recent R CEA:  Poor postop PO intake and maybe neurovegetative impact of carotid sinus manipulation may have contributed to recent labile BP.  Signed, [name], MD  03/25/2016, 10:18 AM

## 2016-03-25 NOTE — Progress Notes (Signed)
ANTICOAGULATION CONSULT NOTE - Follow up [place] for Heparin Indication: atrial fibrillation  Vital Signs: Temp: 98.7 F (37.1 C) (11/16 1809) Temp Source: Oral (11/16 1809) BP: 138/68 (11/16 1809) Pulse Rate: 81 (11/16 1809)   Wt: 72.8 kg IBW: 50.1 kg Hep wt: 65.6 kg  Labs:  Recent Labs  03/23/16 0523  03/24/16 0527 03/24/16 1501 03/24/16 2042 03/25/16 0621  HGB  --   --  12.1  --   --  11.8*  HCT  --   --  38.6  --   --  37.5  PLT  --   --  172  --   --  171  LABPROT 22.5*  --  21.2*  --   --  19.9*  INR 1.95  --  1.81  --   --  1.67  HEPARINUNFRC  --   < > 0.84* 0.68 0.79* 0.68  < > = values in this interval not displayed.  Estimated Creatinine Clearance: 64 mL/min (by C-G formula based on SCr of 0.83 mg/dL).  Assessment: 65 y.o. female with h/o Afib, warfarin on hold, heparin started in preparation for cath. LHC done 11/16 which showed moderate mitral valve stenosis and 100% stenosis of the prox RCA lesions however collaterals were present. Plans are for CVTS evaluation for mitral valve repair. Pharmacy has been consulted to resume heparin 6 hours after sheath pulled. Per the cath lab report - it appears the sheath was removed and a TR band placed at 1433.   Goal of Therapy:  Heparin level = 0.3-0.7 Monitor platelets by anticoagulation protocol: Yes   Plan:  1. Restart heparin at 650 units/hr (6.5 ml/hr) starting at 2100 2. Will continue to monitor for any signs/symptoms of bleeding and will follow up with heparin level with the AM labs.   Thank you for allowing pharmacy to be a part of this patient's care.  Alycia Rossetti, PharmD, BCPS Clinical Pharmacist Pager: [phone] 03/25/2016 6:48 PM

## 2016-03-25 NOTE — Interval H&P Note (Signed)
Cath Lab Visit (complete for each Cath Lab visit)  Clinical Evaluation Leading to the Procedure:   ACS: Yes.    Non-ACS:    Anginal Classification: CCS III  Anti-ischemic medical therapy: Minimal Therapy (1 class of medications)  Non-Invasive Test Results: No non-invasive testing performed  Prior CABG: No previous CABG   Evaluate mitral valve   History and Physical Interval Note:  03/25/2016 1:03 PM  Marie Malone  has presented today for surgery, with the diagnosis of syncope  The various methods of treatment have been discussed with the patient and family. After consideration of risks, benefits and other options for treatment, the patient has consented to  Procedure(s): Right/Left Heart Cath and Coronary Angiography (N/A) as a surgical intervention .  The patient's history has been reviewed, patient examined, no change in status, stable for surgery.  I have reviewed the patient's chart and labs.  Questions were answered to the patient's satisfaction.     Marie Malone

## 2016-03-26 ENCOUNTER — Inpatient Hospital Stay (HOSPITAL_COMMUNITY): Payer: BC Managed Care – PPO

## 2016-03-26 ENCOUNTER — Other Ambulatory Visit: Payer: Self-pay | Admitting: *Deleted

## 2016-03-26 ENCOUNTER — Ambulatory Visit (HOSPITAL_COMMUNITY): Payer: BC Managed Care – PPO

## 2016-03-26 ENCOUNTER — Encounter (HOSPITAL_COMMUNITY): Payer: Self-pay | Admitting: Interventional Cardiology

## 2016-03-26 DIAGNOSIS — I482 Chronic atrial fibrillation: Secondary | ICD-10-CM

## 2016-03-26 DIAGNOSIS — I2510 Atherosclerotic heart disease of native coronary artery without angina pectoris: Secondary | ICD-10-CM

## 2016-03-26 DIAGNOSIS — I050 Rheumatic mitral stenosis: Secondary | ICD-10-CM

## 2016-03-26 DIAGNOSIS — Z01810 Encounter for preprocedural cardiovascular examination: Secondary | ICD-10-CM

## 2016-03-26 DIAGNOSIS — I342 Nonrheumatic mitral (valve) stenosis: Secondary | ICD-10-CM

## 2016-03-26 HISTORY — DX: Atherosclerotic heart disease of native coronary artery without angina pectoris: I25.10

## 2016-03-26 LAB — HEPARIN LEVEL (UNFRACTIONATED)
Heparin Unfractionated: 0.78 IU/mL — ABNORMAL HIGH (ref 0.30–0.70)
Heparin Unfractionated: 0.39 IU/mL (ref 0.30–0.70)

## 2016-03-26 LAB — BLOOD GAS, ARTERIAL
ACID-BASE DEFICIT: 0.9 mmol/L (ref 0.0–2.0)
Bicarbonate: 22.9 mmol/L (ref 20.0–28.0)
DRAWN BY: 365271
FIO2: 21
O2 SAT: 95.7 %
PH ART: 7.427 (ref 7.350–7.450)
Patient temperature: 98.2
pCO2 arterial: 35.3 mmHg (ref 32.0–48.0)
pO2, Arterial: 82.1 mmHg — ABNORMAL LOW (ref 83.0–108.0)

## 2016-03-26 LAB — CBC
HCT: 35.5 % — ABNORMAL LOW (ref 36.0–46.0)
HEMOGLOBIN: 10.9 g/dL — AB (ref 12.0–15.0)
MCH: 26.3 pg (ref 26.0–34.0)
MCHC: 30.7 g/dL (ref 30.0–36.0)
MCV: 85.5 fL (ref 78.0–100.0)
Platelets: 184 10*3/uL (ref 150–400)
RBC: 4.15 MIL/uL (ref 3.87–5.11)
RDW: 16 % — ABNORMAL HIGH (ref 11.5–15.5)
WBC: 4.8 10*3/uL (ref 4.0–10.5)

## 2016-03-26 LAB — URINALYSIS, ROUTINE W REFLEX MICROSCOPIC
BILIRUBIN URINE: NEGATIVE
Glucose, UA: NEGATIVE mg/dL
Hgb urine dipstick: NEGATIVE
Ketones, ur: NEGATIVE mg/dL
LEUKOCYTES UA: NEGATIVE
NITRITE: NEGATIVE
Protein, ur: NEGATIVE mg/dL
SPECIFIC GRAVITY, URINE: 1.015 (ref 1.005–1.030)
pH: 6.5 (ref 5.0–8.0)

## 2016-03-26 LAB — PROTIME-INR
INR: 1.37
PROTHROMBIN TIME: 16.9 s — AB (ref 11.4–15.2)

## 2016-03-26 MED ORDER — WARFARIN - PHARMACIST DOSING INPATIENT
Freq: Every day | Status: DC
  Administered 2016-03-26: 18:00:00

## 2016-03-26 MED ORDER — CHLORHEXIDINE GLUCONATE 0.12 % MT SOLN
15.0000 mL | Freq: Two times a day (BID) | OROMUCOSAL | Status: DC
  Administered 2016-03-26 – 2016-04-03 (×15): 15 mL via OROMUCOSAL
  Filled 2016-03-26 (×13): qty 15

## 2016-03-26 MED ORDER — WARFARIN SODIUM 7.5 MG PO TABS
7.5000 mg | ORAL_TABLET | Freq: Once | ORAL | Status: AC
  Administered 2016-03-26: 7.5 mg via ORAL
  Filled 2016-03-26: qty 1

## 2016-03-26 NOTE — Care Management Note (Signed)
Case Management Note Previous CM note initiated by Sharin Mons, RN 03/23/2016, 7:58 PM   Patient Details  Name: Marie Malone MRN: [id number] Date of Birth: 1950/11/24  Subjective/Objective:      Presented with AMS, hx of hypertension, dyslipidemia,  mitral stenosis and moderate to severe pulmonary hypertension, CVA, recent right carotid endarterectomy on 03/10/16, Atrialfibrillation,  aortic valvulotomy in 2008. From home with husband. States independent with ADL's, no DME usage.      Recently admitted to the hospital from 11/10-11/11 after a "staring" spell. Work up was non-revealing.   - s/p TEE 03/24/2016, plan R heart cath on 03/25/2016    Ja'Net Vanoverbeke (Daughter)      [phone]       PCP: Debbrah Alar  Action/Plan: Return to home when medically stable. CM to f/u with disposition needs.  Expected Discharge Date:                  Expected Discharge Plan:  [place]  In-House Referral:     Discharge planning Services  CM Consult  Post Acute Care Choice:  Home Health Choice offered to:  Patient  DME Arranged:    DME Agency:     HH Arranged:    [place] Agency:     Status of Service:  In process, will continue to follow  If discussed at Long Length of Stay Meetings, dates discussed:    Additional Comments:  03/26/16- 1400- Jerame Hedding RN, CM- referral for [facility]- spoke with pt at bedside- list provided for [facility] agencies for choice- per pt plan is for surgery next week possibly Tues?- will hold off on [facility] arrangement as d/c plan may change- pt will hold on to list for possible HH needs- CM to continue to follow.   Dahlia Client [place], RN 03/26/2016, 2:30 PM [phone]

## 2016-03-26 NOTE — Progress Notes (Addendum)
Patient ID: Marie Malone, female   DOB: 30-Nov-1950, 65 y.o.   MRN: [id number]    PROGRESS NOTE    Myah Guynes Kryder  GLO:[id number] DOB: 09-20-1950 DOA: 03/21/2016  PCP: [name], NP   Brief Narrative:  65 y.o. female with medical history significant for hypertension, dyslipidemia, known mitral stenosis and moderate to severe pulmonary hypertension, history of prior CVA, recent right carotid endarterectomy on 11/1, A2 fibrillation on warfarin, prior aortic valvulotomy in 2008. Patient has been having issues with persistent headache and operative site discomfort since the surgical procedure. Patient took 2 Tylenol PM's prior to going to bed around 11 PM and woke up just before 2 AM and ask for water. When her husband returned patient was sitting up in the bed but "staring off"and would not respond to him.  Assessment & Plan: Near syncope, secondary to orthostatics and ? A-fib/MS related  - no recurrence since admission - Has second degree AV block, Mobitz type 1 on beta blocker but without bradycardia or lengthy pauses - keep on tele for now - ambulate as tolerated, pt says she has been walking to the bathroom with no difficulties   Moderate (to severe?) MS - gradients are high on TEE - pt is now status post right/left cardiac cath - conclusion below  The left ventricular systolic function is normal.  The left ventricular ejection fraction is 55-65% by visual estimate.  There is no aortic valve stenosis.  There is moderate mitral valve stenosis. Mean gradient 10 mm Hg. MV area 1.3 cm2  Hemodynamic findings consistent with moderate pulmonary hypertension and mitral valve stenosis. - continue to follow up on cardiology team recommendations  - surgical eval to determine need for Mitral valve repair   PAFib - currently on Heparin, suspect we can resume Coumadin when cardiology agrees   Sinus tachycardia - resolved, HR at target range  - continue Metoprolol 25 mg PO  QD  Recent R CEA - Poor postop PO intake and maybe neurovegetative impact of carotid sinus manipulation may have contributed to recent labile BP - BP currently stable   DVT prophylaxis: Heparin --> transition to Coumadin when cardiology OK's Code Status: Full Family Communication: Patient and husband at bedside  Disposition Plan: when cardiology team clears  Consultants:   Cardiology   Procedures:   Cardiac cath 11/16 -->  Antimicrobials:   None  Subjective: No events overnight.   Objective: Vitals:   03/25/16 1851 03/25/16 2055 03/26/16 0500 03/26/16 0956  BP: 109/65 (!) 111/45 117/60 118/60  Pulse:  78 93 76  Resp:  18 18   Temp:  98.3 F (36.8 C) 98.2 F (36.8 C)   TempSrc:  Oral Oral   SpO2:  98% 100%   Weight:   71.9 kg (158 lb 9.6 oz)   Height:        Intake/Output Summary (Last 24 hours) at 03/26/16 1009 Last data filed at 03/26/16 0601  Gross per 24 hour  Intake           417.45 ml  Output              200 ml  Net           217.45 ml   Filed Weights   03/21/16 1504 03/24/16 1140 03/26/16 0500  Weight: 72.8 kg (160 lb 6.4 oz) 72.8 kg (160 lb 6.4 oz) 71.9 kg (158 lb 9.6 oz)    Examination:  General exam: Appears calm and comfortable  Respiratory system: Clear  to auscultation. Respiratory effort normal. Cardiovascular system: RRR. No pedal edema. Gastrointestinal system: Abdomen is nondistended, soft and nontender. No organomegaly or masses felt. Normal bowel sounds heard. Central nervous system: Alert and oriented. No focal neurological deficits. Extremities: Symmetric 5 x 5 power.  Data Reviewed: I have personally reviewed following labs and imaging studies  CBC:  Recent Labs Lab 03/21/16 0641 03/22/16 0905 03/24/16 0527 03/25/16 0621 03/26/16 0715  WBC 6.5 4.4 6.5 5.0 4.8  HGB 11.5* 12.3 12.1 11.8* 10.9*  HCT 36.9 39.5 38.6 37.5 35.5*  MCV 84.4 84.6 85.0 84.7 85.5  PLT 237 243 172 171 466   Basic Metabolic Panel:  Recent  Labs Lab 03/19/16 1028 03/20/16 0445 03/21/16 0641 03/22/16 0905  NA  --  139 137 139  K  --  5.3* 3.9 3.7  CL  --  107 104 104  CO2  --  24 22 25   GLUCOSE  --  93 101* 103*  BUN  --  13 16 15   CREATININE  --  0.75 0.82 0.83  CALCIUM  --  9.6 9.7 9.6  PHOS 3.7  --   --   --    Coagulation Profile:  Recent Labs Lab 03/22/16 0905 03/23/16 0523 03/24/16 0527 03/25/16 0621 03/26/16 0715  INR 1.85 1.95 1.81 1.67 1.37   BNP (last 3 results)  Recent Labs  10/10/15 1343 01/20/16 1539  PROBNP 238.0* 136.0*   Radiology Studies: No results found.  Scheduled Meds: . atorvastatin  80 mg Oral q1800  . docusate sodium  100 mg Oral BID  . metoprolol succinate  25 mg Oral Daily  . polyethylene glycol  17 g Oral Daily  . sodium chloride flush  3 mL Intravenous Q12H   Continuous Infusions: . heparin 550 Units/hr (03/26/16 0856)    LOS: 3 days   Time spent: 20 minutes   Faye Ramsay, MD [facility] Pager [phone]  If 7PM-7AM, please contact night-coverage [email or web address] Password [email or web address] 03/26/2016, 10:09 AM

## 2016-03-26 NOTE — Consult Note (Addendum)
[name]       [name]             [phone]          CARDIOTHORACIC SURGERY CONSULTATION REPORT  PCP is [name], NP Referring Provider is [name], MD Primary Cardiologist is [name], MD  Reason for consultation:  Severe mitral stenosis  HPI:  Patient is a 65 year old African-American female with rheumatic heart disease including mitral stenosis status post balloon mitral valvuloplasty in 1165, chronic diastolic congestive heart failure, recurrent paroxysmal atrial fibrillation, 2 previous embolic strokes who has been referred for surgical consultation to discuss treatment options for management of severe mitral stenosis.  The patient denies any known history of rheumatic fever during childhood but she states that she had recurrent streptococcal infections on many occasions during her teenage years. She first presented with symptoms of shortness of breath in 2008 and was diagnosed with severe mitral stenosis. She was initially evaluated by a cardiologist in [facility].  Cardiac catheterization performed in June of 2008 showed normal LV function, no coronary disease, moderate mitral stenosis with a valve area of 1.1 cm, and severe pulmonary hypertension. TEE in [facility] in 2008 showed normal LV function and moderate mitral stenosis with a valve area of 1.4 cm, mild MR. She was referred to [facility] where she underwent balloon mitral valvuloplasty. She did well for several years but she suffered an embolic stroke in 7903.  She was reportedly anticoagulated using warfarin for a period of time but this was later stopped. In 2014 the patient was referred to [name] for evaluation and she has been followed closely ever since. In November 2014 she suffered a second embolic stroke which involved a fairly large area in the left MCA distribution. Symptoms of right-sided weakness improved very quickly. TEE  performed at that time revealed mild mitral stenosis and mild mitral regurgitation. There was spontaneous contrast in the dilated left atrium and left atrial appendage, but no thrombus. There was a small oscillating density on the anterior mitral valve leaflet of uncertain etiology, and vegetation could not be excluded.  Blood cultures were negative, but the patient had been receiving oral doxycycline. There were no other signs of endocarditis, although sed rate was elevated. The patient was started on Plavix because of her stroke.  An implantable loop recorder was placed and she was treated with a 6 week course of antibiotics.    Implantable loop recorder confirmed the presence of recurrent paroxysmal atrial fibrillation, and the patient was started on warfarin.  The patient did well for approximately 2 years and has been followed intermittently by [name] and [name].  Follow-up transthoracic echocardiogram performed in August of this year revealed normal left ventricular systolic function with moderate mitral stenosis (mean transvalvular gradient estimated 8 mmHg) moderate mitral regurgitation, and severe left atrial enlargement. At that time the patient began to complain of occasional substernal chest tightness and worsening dyspnea with exertion. Follow-up carotid duplex scan performed at that time revealed 60-79% stenosis of the right internal carotid artery and CTA was recommended.  CTA revealed radiographic string sign consistent with tight right internal carotid artery stenosis. The patient subsequently underwent elective right carotid endarterectomy with patch angioplasty by [name] on 03/10/2016.  She initially did well but she was readmitted to the hospital on 03/19/2016 with altered mental status associated with tachycardia and hypotension. She was observed overnight in the hospital  where she was given gentle IV hydration. It was felt that her symptoms were likely related to hypotension and  orthostasis. She was discharged home the following day but was again readmitted less than 24 hours later following a frank syncopal episode.  In the emergency department she was still noted to be orthostatic with drop in blood pressure from 152/61 to 118/62.  She was given IV hydration.  MRI of the brain revealed no evidence for acute stroke. Neurology was consulted and EEG revealed normal awake and sleep findings with no seizure-like activity.  EKG revealed sinus rhythm with occasional brief runs of nonsustained VT.  follow-up echocardiogram revealed normal left ventricular systolic function with severe mitral stenosis and mild mitral regurgitation. Peak and mean transvalvular gradients across the mitral valve were estimated 12 and 9 mmHg, respectively.  Indexed valve area by pressure half-time was reported 0.82 cm/m.  The patient subsequently underwent transesophageal echocardiogram 03/24/2016. This confirmed the presence of rheumatic mitral valve disease with severe mitral stenosis. Mean transvalvular gradient across mitral valve was estimated 11 mmHg with valve area by pressure half-time estimated 1.25 cm but valve area using the continuity equation only 0.77 cm. The left atrium was dilated. There was no thrombus in the left atrial appendage.  The patient underwent left and right heart catheterization 03/25/2016. This confirmed the presence of normal left ventricular systolic function. Mean transvalvular gradient across the mitral valve was entered 10 mmHg corresponding to valve area calculated 1.3 cm, indexed valve area 0.76 cm/m. The patient had severe single-vessel coronary artery disease with 100% chronic occlusion of the right coronary artery and left to right collaterals. There was moderate pulmonary hypertension with PA pressures measured 66/21,  mean pulmonary capillary wedge pressure 13 mmHg and mean CVP reported 1 mmHg . Cardiothoracic surgical consultation was requested.  The patient is  married and lives locally in [place] with her husband. Her daughter lives in [place] but is at the bedside with the patient's husband today for consultation. 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 She reports that her only significant physical limitation is that of exertional shortness of breath. Symptoms have been progressing for the last several months and she now gets short of breath with relatively mild activity. She denies any history of resting shortness of breath, PND, orthopnea, or lower extremity edema. She reports occasional palpitations. Prior to carotid endarterectomy she has not previously experienced any episodes of dizzy spells or syncope.  At this point she feels as though she has not yet completely recovered from her recent surgery. Her appetite is been slowly improving. She knows that she has poor dentition and she has not seen a dentist in many years.  Past Medical History:  Diagnosis Date  . Atrial fibrillation ([place])   . Complication of anesthesia    difficult to wake up from anesthesia  . Heart murmur   . History of rheumatic fever    as a child  . Hyperlipidemia   . Hypertension   . Mitral stenosis   . OSA (obstructive sleep apnea)    mild per sleep study 2008  . Paroxysmal atrial fibrillation ([facility])   . Pneumonia    double pneumonia once  . Stroke [facility])    x2    Past Surgical History:  Procedure Laterality Date  . APPENDECTOMY  1989   appendix ruptured,had peritonitis  .  BALLOON VALVULOPLASTY  2008   [facility]  . CARDIAC CATHETERIZATION N/A 03/25/2016   Procedure: Right/Left Heart Cath and Coronary Angiography;  Surgeon: Jettie Booze, MD;  Location: [facility];  Service: Cardiovascular;  Laterality: N/A;  . COLONOSCOPY W/ POLYPECTOMY    . ENDARTERECTOMY Right 03/10/2016   Procedure: ENDARTERECTOMY CAROTID RIGHT;   Surgeon: Serafina Mitchell, MD;  Location: [facility];  Service: Vascular;  Laterality: Right;  . EP IMPLANTABLE DEVICE N/A 03/11/2016   Procedure: Loop Recorder Removal;  Surgeon: Deboraha Sprang, MD;  Location: [place] [facility];  Service: Cardiovascular;  Laterality: N/A;  . 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 LOOP RECORDER IMPLANT N/A 04/10/2013   Procedure: LOOP RECORDER IMPLANT;  Surgeon: Coralyn Mark, MD;  Location: [facility] CATH LAB;  Service: Cardiovascular;  Laterality: N/A;  . PATCH ANGIOPLASTY Right 03/10/2016   Procedure: PATCH ANGIOPLASTY CAROTID RIGHT USING Rueben Bash BIOLOGIC PATCH;  Surgeon: Serafina Mitchell, MD;  Location: [facility];  Service: Vascular;  Laterality: Right;  . TEE WITHOUT CARDIOVERSION N/A 04/10/2013   Procedure: TRANSESOPHAGEAL ECHOCARDIOGRAM (TEE);  Surgeon: [name], MD;  Location: [facility] ENDOSCOPY;  Service: Cardiovascular;  Laterality: N/A;  . TEE WITHOUT CARDIOVERSION N/A 03/24/2016   Procedure: TRANSESOPHAGEAL ECHOCARDIOGRAM (TEE);  Surgeon: Jerline Pain, MD;  Location: [facility] ENDOSCOPY;  Service: Cardiovascular;  Laterality: N/A;  . TUBAL LIGATION      Family History  Problem Relation Age of Onset  . Diabetes Mother   . Stroke Mother   . Diabetes Father   . Heart disease Father     CHF  . Cancer Father     kidney  . Diabetes Sister   . Diabetes Brother   . Hypertension Daughter     Social History   Social History  . Marital status: Married    Spouse name: N/A  . Number of children: 2  . Years of education: Associates   Occupational History  . Not on file.   Social History Main Topics  . Smoking status: Former Smoker    Packs/day: 1.00    Years: 5.00    Types: Cigarettes    Start date: 07/15/1970    Quit date: 05/11/1975  . Smokeless tobacco: Never Used     Comment: quit smoking 36 years ago  . Alcohol use No     Comment: drank years ago when young  . Drug use: No  . Sexual activity: Not on  file   Other Topics Concern  . Not on file   Social History Narrative   Retired Archivist   Married   She has 2 grown children-   Daughter lives in [place]   Son lives in [place]   6 grandchildren        Prior to Admission medications   Medication Sig Start Date End Date Taking? Authorizing Provider  warfarin (COUMADIN) 5 MG tablet Take 5 mg by mouth daily. 5 mg daily except 7.5 mg on Wednesdays   Yes Historical Provider, MD  acetaminophen (TYLENOL) 325 MG tablet Take 650 mg by mouth every 6 (six) hours as needed for mild pain.     Historical Provider, MD  amLODipine (NORVASC) 10 MG tablet TAKE ONE TABLET BY MOUTH ONCE DAILY 02/04/16   Debbrah Alar, NP  atorvastatin (LIPITOR) 80 MG tablet TAKE ONE TABLET BY MOUTH ONCE DAILY AT 6 PM 03/15/16   Debbrah Alar, NP  docusate  sodium (COLACE) 100 MG capsule Take 1 capsule (100 mg total) by mouth 2 (two) times daily. 03/20/16   Jennifer Chahn-Yang Choi, DO  metoprolol succinate (TOPROL-XL) 25 MG 24 hr tablet Take 1 tablet (25 mg total) by mouth daily. 03/15/16   Debbrah Alar, NP  oxyCODONE-acetaminophen (PERCOCET/ROXICET) 5-325 MG tablet Take 1-2 tablets by mouth every 4 (four) hours as needed for moderate pain. 03/11/16   Alvia Grove, PA-C  polyethylene glycol (MIRALAX / GLYCOLAX) packet Take 17 g by mouth daily. 03/20/16   Shon Millet, DO    Current Facility-Administered Medications  Medication Dose Route Frequency Provider Last Rate Last Dose  . 0.9 %  sodium chloride infusion  250 mL Intravenous PRN Jettie Booze, MD      . acetaminophen (TYLENOL) tablet 650 mg  650 mg Oral Q4H PRN Jettie Booze, MD      . atorvastatin (LIPITOR) tablet 80 mg  80 mg Oral q1800 Rich Fuchs Choi, DO   80 mg at 03/24/16 1704  . chlorhexidine (PERIDEX) 0.12 % solution 15 mL  15 mL Mouth/Throat BID Rexene Alberts, MD      . docusate sodium (COLACE) capsule 100 mg  100 mg Oral BID Shon Millet, DO   100 mg at 03/26/16 1034  . heparin ADULT infusion 100 units/mL (25000 units/267mL sodium chloride 0.45%)  550 Units/hr Intravenous Continuous Romona Curls, RPH 5.5 mL/hr at 03/26/16 0856 550 Units/hr at 03/26/16 0856  . metoprolol succinate (TOPROL-XL) 24 hr tablet 25 mg  25 mg Oral Daily Mihai Croitoru, MD   25 mg at 03/26/16 0956  . ondansetron (ZOFRAN) injection 4 mg  4 mg Intravenous Q6H PRN Jettie Booze, MD      . polyethylene glycol (MIRALAX / GLYCOLAX) packet 17 g  17 g Oral Daily Rich Fuchs Choi, DO   17 g at 03/26/16 1034  . sodium chloride flush (NS) 0.9 % injection 3 mL  3 mL Intravenous Q12H Jettie Booze, MD   3 mL at 03/25/16 1845  . sodium chloride flush (NS) 0.9 % injection 3 mL  3 mL Intravenous PRN Jettie Booze, MD        Allergies  Allergen Reactions  . No Known Allergies       Review of Systems:   General:  normal appetite, decreased energy, no weight gain, no weight loss, no fever  Cardiac:  no chest pain with exertion, occasional chest pain at rest, + SOB with exertion, no resting SOB, no PND, no orthopnea, + palpitations, + arrhythmia, + atrial fibrillation, no LE edema, + dizzy spells, + syncope  Respiratory:  + shortness of breath, no home oxygen, no productive cough, no dry cough, no bronchitis, no wheezing, no hemoptysis, no asthma, no pain with inspiration or cough, + sleep apnea, no CPAP at night  GI:   mild difficulty swallowing since carotid endarterectomy, no reflux, no frequent heartburn, no hiatal hernia, no abdominal pain, + constipation, no diarrhea, no hematochezia, no hematemesis, no melena  GU:   no dysuria,  no frequency, no urinary tract infection, no hematuria, no kidney stones, no kidney disease  Vascular:  no pain suggestive of claudication, no pain in feet, no leg cramps, no varicose veins, no DVT, no non-healing foot ulcer  Neuro:   + stroke, no TIA's, no seizures, no headaches, no temporary  blindness one eye,  no slurred speech, no peripheral neuropathy, no chronic pain, no instability of gait, no memory/cognitive  dysfunction  Musculoskeletal: no arthritis, no joint swelling, no myalgias, no difficulty walking, normal mobility   Skin:   no rash, no itching, no skin infections, no pressure sores or ulcerations  Psych:   no anxiety, no depression, no nervousness, no unusual recent stress  Eyes:   no blurry vision, no floaters, no recent vision changes, + wears glasses or contacts  ENT:   no hearing loss, + loose or painful teeth, no dentures, last saw dentist many years ago  Hematologic:  no easy bruising, no abnormal bleeding, no clotting disorder, no frequent epistaxis  Endocrine:  no diabetes, does not check CBG's at home     Physical Exam:   BP 118/60   Pulse 76   Temp 98.2 F (36.8 C) (Oral)   Resp 18   Ht 5\' 2"  (1.575 m)   Wt 158 lb 9.6 oz (71.9 kg)   SpO2 100%   BMI 29.01 kg/m   General:  Mildly obese,  well-appearing  HEENT:  Unremarkable except poor dentition with multiple missing teeth  Neck:   no JVD, no bruits, no adenopathy, incision on right neck healing nicely  Chest:   clear to auscultation, symmetrical breath sounds, no wheezes, no rhonchi   CV:   RRR, no murmur   Abdomen:  soft, non-tender, no masses   Extremities:  warm, well-perfused, pulses not palpable, no lower extremity edema  Rectal/GU  Deferred  Neuro:   Grossly non-focal and symmetrical throughout  Skin:   Clean and dry, no rashes, no breakdown  Diagnostic Tests:  Transthoracic Echocardiography  Patient:    Marie Malone, Marie Malone MR #:       [id number] Study Date: 03/23/2016 Gender:     F Age:        64 Height:     157.5 cm Weight:     72.8 kg BSA:        1.81 m^2 Pt. Status: Room:       [place], [name]  PERFORMING   [name], Inpatient  ADMITTING    Dessa Phi Chahn-Yang  ATTENDING    Dessa Phi Chahn-Yang  Chase Caller, Jennifer Chahn-Yang   REFERRING    Dessa Phi Chahn-Yang  SONOGRAPHER  Chelsea Androw  cc:  ------------------------------------------------------------------- LV EF: 55% -   60%  ------------------------------------------------------------------- Indications:      Rheumatic MS 394.0.  ------------------------------------------------------------------- History:   PMH:   Atrial fibrillation.  Risk factors: Hypertension. Dyslipidemia.  ------------------------------------------------------------------- Study Conclusions  - Left ventricle: The cavity size was normal. Wall thickness was   increased in a pattern of mild LVH. Systolic function was normal.   The estimated ejection fraction was in the range of 55% to 60%.   Wall motion was normal; there were no regional wall motion   abnormalities. Doppler parameters are consistent with high   ventricular filling pressure. - Ventricular septum: The contour showed diastolic flattening and   systolic flattening. - Mitral valve: Calcified annulus. Severely thickened leaflets .   The findings are consistent with severe stenosis. There was mild   regurgitation. Valve area by pressure half-time: 1.48 cm^2. - Left atrium: The atrium was severely dilated. - Atrial septum: The septum bowed from left to right, consistent   with increased left atrial pressure. - Pericardium, extracardiac: A small pericardial effusion was   identified.  Impressions:  - Normal LV systolic function; mild LVH; elevated LV filling   pressure; calcified aortic valve with no significant AS;  thickened, calcified MV with probable severe MS (MVA by pressure   halftime 1.48 cm); severe LAE.  ------------------------------------------------------------------- Study data:  Comparison was made to the study of 12/30/2015.  Study status:  Routine.  Procedure:  The patient reported no pain pre or post test. Transthoracic echocardiography. Image quality was adequate.  Study  completion:  There were no complications. Transthoracic echocardiography.  M-mode, complete 2D, spectral Doppler, and color Doppler.  Birthdate:  Patient birthdate: 1951-01-29.  Age:  Patient is 65 yr old.  Sex:  Gender: female. BMI: 29.3 kg/m^2.  Blood pressure:     99/63  Patient status: Inpatient.  Study date:  Study date: 03/23/2016. Study time: 02:45 PM.  Location:  Bedside.  -------------------------------------------------------------------  ------------------------------------------------------------------- Left ventricle:  The cavity size was normal. Wall thickness was increased in a pattern of mild LVH. Systolic function was normal. The estimated ejection fraction was in the range of 55% to 60%. Wall motion was normal; there were no regional wall motion abnormalities. Doppler parameters are consistent with high ventricular filling pressure.  ------------------------------------------------------------------- Aortic valve:   Trileaflet; moderately calcified leaflets. Mobility was not restricted.  Doppler:  Transvalvular velocity was within the normal range. There was no stenosis. There was no regurgitation.    Peak velocity ratio of LVOT to aortic valve: 0.59. Indexed valve area (Vmax): 0.92 cm^2/m^2.    Peak gradient (S): 16 mm Hg.  ------------------------------------------------------------------- Aorta:  Aortic root: The aortic root was normal in size.  ------------------------------------------------------------------- Mitral valve:   Calcified annulus. Severely thickened leaflets . Doppler:   The findings are consistent with severe stenosis. There was mild regurgitation.    Valve area by pressure half-time: 1.48 cm^2. Indexed valve area by pressure half-time: 0.82 cm^2/m^2. Indexed valve area by continuity equation (using LVOT flow): 0.61 cm^2/m^2.    Mean gradient (D): 9 mm Hg. Peak gradient (D): 12  mm Hg.  ------------------------------------------------------------------- Left atrium:  The atrium was severely dilated.  ------------------------------------------------------------------- Atrial septum:  The septum bowed from left to right, consistent with increased left atrial pressure.  ------------------------------------------------------------------- Right ventricle:  The cavity size was normal. Systolic function was normal.  ------------------------------------------------------------------- Ventricular septum:   The contour showed diastolic flattening and systolic flattening.  ------------------------------------------------------------------- Pulmonic valve:    Doppler:  Transvalvular velocity was within the normal range. There was no evidence for stenosis.  ------------------------------------------------------------------- Tricuspid valve:   Structurally normal valve.    Doppler: Transvalvular velocity was within the normal range. There was trivial regurgitation.  ------------------------------------------------------------------- Right atrium:  The atrium was normal in size.  ------------------------------------------------------------------- Pericardium:  A small pericardial effusion was identified.  ------------------------------------------------------------------- Systemic veins: Inferior vena cava: The vessel was normal in size.  ------------------------------------------------------------------- Measurements   Left ventricle                           Value           Reference  LV ID, ED, PLAX chordal          (L)     31.9   mm       43 - 52  LV ID, ES, PLAX chordal                  24.1   mm       23 - 38  LV fx shortening, PLAX chordal   (L)     24     %        >=29  LV PW thickness, ED                      11.9   mm       ---------  IVS/LV PW ratio, ED                      1.01            <=1.3  Stroke volume, 2D                         54     ml       ---------  Stroke volume/bsa, 2D                    30     ml/m^2   ---------  LV e&', lateral                           4.68   cm/s     ---------  LV E/e&', lateral                         36.55           ---------  LV e&', medial                            5.44   cm/s     ---------  LV E/e&', medial                          31.44           ---------  LV e&', average                           5.06   cm/s     ---------  LV E/e&', average                         33.81           ---------    Ventricular septum                       Value           Reference  IVS thickness, ED                        12     mm       ---------    LVOT                                     Value           Reference  LVOT ID, S                               19     mm       ---------  LVOT area                                2.84  cm^2     ---------  LVOT peak velocity, S                    116    cm/s     ---------  LVOT mean velocity, S                    72     cm/s     ---------  LVOT VTI, S                              19.1   cm       ---------  LVOT peak gradient, S                    5      mm Hg    ---------    Aortic valve                             Value           Reference  Aortic valve peak velocity, S            197    cm/s     ---------  Aortic peak gradient, S                  16     mm Hg    ---------  Velocity ratio, peak, LVOT/AV            0.59            ---------  Aortic valve area/bsa, peak              0.92   cm^2/m^2 ---------  velocity    Aorta                                    Value           Reference  Aortic root ID, ED                       26     mm       ---------    Left atrium                              Value           Reference  LA ID, A-P, ES                           43     mm       ---------  LA ID/bsa, A-P                   (H)     2.38   cm/m^2   <=2.2  LA volume, S                             79     ml       ---------  LA volume/bsa, S  43.7   ml/m^2   ---------  LA volume, ES, 1-p A4C                   85.3   ml       ---------  LA volume/bsa, ES, 1-p A4C               47.2   ml/m^2   ---------  LA volume, ES, 1-p A2C                   71.9   ml       ---------  LA volume/bsa, ES, 1-p A2C               39.8   ml/m^2   ---------    Mitral valve                             Value           Reference  Mitral E-wave peak velocity              171.06 cm/s     ---------  Mitral A-wave peak velocity              153    cm/s     ---------  Mitral mean velocity, D                  142    cm/s     ---------  Mitral deceleration slope                333.4  cm/s^2   ---------  Mitral deceleration time         (H)     513    ms       150 - 230  Mitral pressure half-time                149    ms       ---------  Mitral mean gradient, D                  9      mm Hg    ---------  Mitral peak gradient, D                  12     mm Hg    ---------  Mitral E/A ratio, peak                   1.12            ---------  Mitral valve area, PHT, DP               1.48   cm^2     ---------  Mitral valve area/bsa, PHT, DP           0.82   cm^2/m^2 ---------  Mitral valve area/bsa, LVOT              0.61   cm^2/m^2 ---------  continuity  Mitral annulus VTI, D                    49     cm       ---------    Systemic veins                           Value  Reference  Estimated CVP                            3      mm Hg    ---------    Right ventricle                          Value           Reference  TAPSE                                    19.9   mm       ---------  RV s&', lateral, S                        12.3   cm/s     ---------  Legend: (L)  and  (H)  mark values outside specified reference range.  ------------------------------------------------------------------- Prepared and Electronically Authenticated by  [name] 2017-11-14T17:14:56   Transesophageal Echocardiography  Patient:    Glorian, Mcdonell MR #:       [id number] Study Date: 03/24/2016 Gender:     F Age:        39 Height:     157.5 cm Weight:     72.8 kg BSA:        1.81 m^2 Pt. Status: Room:       [id number]   PERFORMING   Candee Furbish, M.D.  ATTENDING    Ghimire, Shanker Creta Levin, Brittainy M  REFERRING    Reese, [place] M  SONOGRAPHER  Donata Clay  ADMITTING    Dessa Phi Chahn-Yang  cc:  ------------------------------------------------------------------- LV EF: 55% -   60%  ------------------------------------------------------------------- Indications:      424.0 Mitral valve disease.  ------------------------------------------------------------------- History:   PMH:  Syncope. PAF. Tachycardia. Recent R CEA.  ------------------------------------------------------------------- Study Conclusions  - Left ventricle: Systolic function was normal. The estimated   ejection fraction was in the range of 55% to 60%. - Aortic valve: Left coronary cusp mobility was mildly restricted.   No evidence of vegetation. - Mitral valve: Mobility of the posterior leaflet was moderately   restricted. The findings are consistent with moderate to severe   stenosis. There was trivial regurgitation. Mean gradient (D): 11   mm Hg. Valve area by pressure half-time: 1.25 cm^2. Valve area by   continuity equation (using LVOT flow): 0.77 cm^2. - Left atrium: The atrium was dilated. No evidence of thrombus in   the appendage. No evidence of thrombus in the atrial cavity or   appendage. - Right atrium: No evidence of thrombus in the atrial cavity or   appendage. - Tricuspid valve: No evidence of vegetation. - Pulmonic valve: No evidence of vegetation. - Superior vena cava: The study excluded a thrombus.  ------------------------------------------------------------------- Study data:   Study status:  Routine.  Consent:  The risks, benefits, and alternatives to the procedure were explained to  the patient and informed consent was obtained.  Procedure:  The patient reported no pain pre or post test. Initial setup. The patient was brought to the laboratory. Surface ECG leads were monitored. Sedation. Moderate sedation was administered by cardiology staff. Transesophageal echocardiography. A transesophageal probe was inserted by the attending cardiologistwith some difficulty. Image quality was adequate.  Study completion:  The patient tolerated the procedure well. There were no complications.  Administered medications:   Midazolam, 5mg , IV.  Fentanyl, 26mcg, IV. Diagnostic transesophageal echocardiography.  2D and color Doppler.  Birthdate:  Patient birthdate: 1951/03/22.  Age:  Patient is 65 yr old.  Sex:  Gender: female.    BMI: 29.3 kg/m^2.  Blood pressure:   135/85  Patient status:  Inpatient.  Study date:  Study date: 03/24/2016. Study time: 12:01 PM.  Location:  Endoscopy.  -------------------------------------------------------------------  ------------------------------------------------------------------- Left ventricle:  Systolic function was normal. The estimated ejection fraction was in the range of 55% to 60%.  ------------------------------------------------------------------- Aortic valve:   Mildly calcified leaflets. Cusp separation was normal.  Left coronary cusp mobility was mildly restricted.  No evidence of vegetation.  Doppler:  There was no regurgitation.  ------------------------------------------------------------------- Aorta:  The aorta was not dilated, mildly calcified, and non-diseased.  ------------------------------------------------------------------- Mitral valve:   Severely thickened leaflets . Mobility of the posterior leaflet was moderately restricted.  Doppler:   The findings are consistent with moderate to severe stenosis.   There was trivial regurgitation.    Valve area by pressure half-time: 1.25 cm^2. Indexed valve area by  pressure half-time: 0.69 cm^2/m^2. Valve area by continuity equation (using LVOT flow): 0.77 cm^2. Indexed valve area by continuity equation (using LVOT flow): 0.43 cm^2/m^2.    Mean gradient (D): 11 mm Hg.  ------------------------------------------------------------------- Left atrium:  The atrium was dilated.  No evidence of thrombus in the appendage.  No evidence of thrombus in the atrial cavity or appendage. The appendage was of normal size. Emptying velocity was normal.  ------------------------------------------------------------------- Right ventricle:  The cavity size was normal. Wall thickness was normal. Systolic function was normal.  ------------------------------------------------------------------- Pulmonic valve:    Structurally normal valve.   Cusp separation was normal.  No evidence of vegetation.  Doppler:  There was mild regurgitation.  ------------------------------------------------------------------- Tricuspid valve:   Structurally normal valve.   Leaflet separation was normal.  No evidence of vegetation.  Doppler:  There was trivial regurgitation.  ------------------------------------------------------------------- Right atrium:  The atrium was normal in size.  No evidence of thrombus in the atrial cavity or appendage.  ------------------------------------------------------------------- Pericardium:  The pericardium was normal in appearance. There was no pericardial effusion.  ------------------------------------------------------------------- Systemic veins: Superior vena cava: The study excluded a thrombus.  ------------------------------------------------------------------- Post procedure conclusions Ascending Aorta:  - The aorta was not dilated, mildly calcified, and non-diseased.  ------------------------------------------------------------------- Measurements   Left ventricle                              Value  Stroke volume, 2D                            28    ml  Stroke volume/bsa, 2D                       15    ml/m^2    LVOT                                        Value  LVOT ID, S                                  19  mm  LVOT area                                   2.84  cm^2  LVOT peak velocity, S                       71.4  cm/s  LVOT mean velocity, S                       46.6  cm/s  LVOT VTI, S                                 9.92  cm  LVOT peak gradient, S                       2     mm Hg    Mitral valve                                Value  Mitral mean velocity, D                     157   cm/s  Mitral pressure half-time                   124   ms  Mitral mean gradient, D                     11    mm Hg  Mitral valve area, PHT, DP                  1.25  cm^2  Mitral valve area/bsa, PHT, DP              0.69  cm^2/m^2  Mitral valve area, LVOT continuity          0.77  cm^2  Mitral valve area/bsa, LVOT continuity      0.43  cm^2/m^2  Mitral annulus VTI, D                       36.5  cm  Legend: (L)  and  (H)  mark values outside specified reference range.  ------------------------------------------------------------------- Prepared and Electronically Authenticated by  Candee Furbish, M.D. 2017-11-15T15:27:36     Right/Left Heart Cath and Coronary Angiography  Conclusion     The left ventricular systolic function is normal.  The left ventricular ejection fraction is 55-65% by visual estimate.  There is no aortic valve stenosis.  There is moderate mitral valve stenosis. Mean gradient 10 mm Hg. MV area 1.3 cm2  Mid Cx to Dist Cx lesion, 10 %stenosed.  Ost 1st Mrg to 1st Mrg lesion, 20 %stenosed.  Prox RCA lesion, 100 %stenosed. Left to right and right to right collaterals are present.  Hemodynamic findings consistent with moderate pulmonary hypertension and mitral valve stenosis.  LV end diastolic pressure is normal.   Further plans per inpatient team.  D/w Dr. Sallyanne Kuster.  Will  obtain surgical evaluation for mitral valve repair.  Resume heparin 6 hours post sheath pull.    Indications   Mitral valve stenosis, unspecified etiology [I05.0 (ICD-10-CM)]  Procedural Details/Technique   Technical Details The risks, benefits, and details of the  procedure were explained to the patient. The patient verbalized understanding and wanted to proceed. Informed written consent was obtained.  PROCEDURE TECHNIQUE: After Xylocaine anesthesia, access in the left antecubital area was attempted through a previously placed peripheral IV. This was unsuccessful. Subsequently, a 7 French sheath was placed in the right common femoral vein after Xylocaine anesthesia. A 7 French balloontipped Swan-Ganz catheter was advanced to the pulmonary artery under fluoroscopic guidance. Hemodynamic pressures were obtained. Oxygen saturations were obtained. After Xylocaine anesthesia, a 84F sheath was placed in the right radial artery with a single anterior needle wall stick. Left coronary angiography was done using a Judkins L3.5 guide catheter. Right coronary angiography was done using a Judkins R4 guide catheter. Left heart cath/ventriculography was done using a pigtail catheter. Simultaneous pulmonary capillary wedge pressure and LV pressure was obtained.    Contrast: 60 cc     Estimated blood loss <50 mL.  During this procedure the patient was administered the following to achieve and maintain moderate conscious sedation: Versed 1 mg, Fentanyl 25 mcg, while the patient's heart rate, blood pressure, and oxygen saturation were continuously monitored. The period of conscious sedation was 60 minutes, of which I was present face-to-face 100% of this time.    Coronary Findings   Dominance: Right  Left Circumflex  Mid Cx to Dist Cx lesion, 10% stenosed.  First Obtuse Marginal Branch  Ost 1st Mrg to 1st Mrg lesion, 20% stenosed.  Right Coronary Artery  Prox RCA lesion, 100% stenosed.  Right Posterior  Descending Artery  RPDA filled by collaterals from Acute Mrg. RPDA filled by collaterals from Dist LAD.  Right Heart   Right Heart Pressures Hemodynamic findings consistent with moderate pulmonary hypertension and mitral valve stenosis. LV EDP is normal.    Right Atrium Right atrial pressure is normal.    Wall Motion              Left Heart   Left Ventricle The left ventricular systolic function is normal. LV end diastolic pressure is normal. The left ventricular ejection fraction is 55-65% by visual estimate. No regional wall motion abnormalities.    Mitral Valve There is moderate mitral valve stenosis.    Aortic Valve There is no aortic valve stenosis.    Coronary Diagrams   Diagnostic Diagram     Implants     No implant documentation for this case.  PACS Images   Show images for Cardiac catheterization   Link to Procedure Log   Procedure Log    Hemo Data   Flowsheet Row Most Recent Value  Fick Cardiac Output 4.12 L/min  Fick Cardiac Output Index 2.37 (L/min)/BSA  Aortic Mean Gradient 9.2 mmHg  Aortic Peak Gradient 0 mmHg  Aortic Valve Area >3.50  Aortic Value Area Index 2.01 cm2/BSA  Mitral Mean Gradient 10.1 mmHg  Mitral Peak Gradient 9 mmHg  Mitral Valve Area Index 0.76 cm2/BSA  RA A Wave 1 mmHg  RA V Wave 5 mmHg  RA Mean 1 mmHg  RV Systolic Pressure 57 mmHg  RV Diastolic Pressure 0 mmHg  RV EDP 1 mmHg  PA Systolic Pressure 66 mmHg  PA Diastolic Pressure 21 mmHg  PA Mean 36 mmHg  PW A Wave 13 mmHg  PW V Wave 15 mmHg  PW Mean 13 mmHg  AO Systolic Pressure 947 mmHg  AO Diastolic Pressure 70 mmHg  AO Mean 96 mmHg  LV Systolic Pressure 654 mmHg  LV Diastolic Pressure 0 mmHg  LV EDP 4 mmHg  Arterial Occlusion Pressure Extended Systolic Pressure 032 mmHg  Arterial Occlusion Pressure Extended Diastolic Pressure 65 mmHg  Arterial Occlusion Pressure Extended Mean Pressure 93 mmHg  Left Ventricular Apex Extended Systolic Pressure 122 mmHg  Left  Ventricular Apex Extended Diastolic Pressure 0 mmHg  Left Ventricular Apex Extended EDP Pressure 3 mmHg  QP/QS 1  TPVR Index 15.2 HRUI  TSVR Index 40.55 HRUI  PVR SVR Ratio 0.22  TPVR/TSVR Ratio 0.38     Impression:  Patient has long-standing rheumatic heart disease with stage D severe symptomatic mitral stenosis with mild mitral regurgitation.  She underwent balloon mitral valvuloplasty in 2008 and did well until recently when she has developed progressive symptoms of exertional shortness of breath and fatigue consistent with chronic diastolic congestive heart failure, New York Heart Association functional class III. She has known history of recurrent paroxysmal atrial fibrillation and has suffered 2 previous embolic strokes, most recently in November 2014, both of which occurred while the patient was not on anticoagulation therapy.  She just recently underwent right carotid endarterectomy for severe asymptomatic right internal carotid artery stenosis. She appears to be recovering uneventfully from this surgical procedure, although she was readmitted to the hospital with syncopal event that has been attributed to orthostatic symptoms with dehydration in the setting of severe mitral stenosis. I have personally reviewed the patient's recent echocardiograms and diagnostic cardiac catheterization. Transthoracic and transesophageal echocardiogram confirmed the presence of classical rheumatic mitral valve disease with severe mitral stenosis and mild mitral regurgitation. Left ventricular systolic function is preserved. The patient has moderate to severe pulmonary hypertension.  There was trivial tricuspid regurgitation.  The mitral valve is severely thickened and heavily calcified, with anatomical features relatively unfavorable for repeat balloon mitral valvuloplasty.  I agree the patient would benefit from elective mitral valve replacement, coronary artery bypass grafting, and Maze procedure.  At this  point the primary question is that of timing. It might make sense to allow the patient's some additional time to recover from her recent carotid endarterectomy. She will likely need dental extraction.   Plan:  I have discussed the nature of the patient's underlying complex valvular heart disease and history of congestive heart failure at length with the patient and her family at the bedside. The indications, risks, and potential benefits of mitral valve replacement, coronary artery bypass grafting, and Maze procedure have been discussed.  The need for dental service consultation and possible extraction has been discussed.  The patient is agreeable with proceeding with further workup and surgery as soon as practical. We will request dental service consultation. If it is conceivable that she might undergo dental extraction during this hospital admission it might be reasonable to continue to hold warfarin. Otherwise it probably makes sense to resume warfarin and make plans for surgery at a later date. All of their questions have been addressed.   I spent in excess of 120 minutes during the conduct of this hospital consultation and >50% of this time involved direct face-to-face encounter for counseling and/or coordination of the patient's care.    Valentina Gu. Roxy Manns, MD 03/26/2016 11:51 AM

## 2016-03-26 NOTE — Progress Notes (Signed)
ANTICOAGULATION CONSULT NOTE - Follow up [place] for Heparin Indication: atrial fibrillation  Vital Signs: Temp: 98.2 F (36.8 C) (11/17 0500) Temp Source: Oral (11/17 0500) BP: 117/60 (11/17 0500) Pulse Rate: 93 (11/17 0500)   Wt: 72.8 kg IBW: 50.1 kg Hep wt: 65.6 kg  Labs:  Recent Labs  03/24/16 0527  03/24/16 2042 03/25/16 0621 03/26/16 0715  HGB 12.1  --   --  11.8* 10.9*  HCT 38.6  --   --  37.5 35.5*  PLT 172  --   --  171 184  LABPROT 21.2*  --   --  19.9* 16.9*  INR 1.81  --   --  1.67 1.37  HEPARINUNFRC 0.84*  < > 0.79* 0.68 0.78*  < > = values in this interval not displayed.  Estimated Creatinine Clearance: 63.6 mL/min (by C-G formula based on SCr of 0.83 mg/dL).  Assessment: 65 y.o. female with h/o Afib, warfarin on hold, heparin started in preparation for cath. LHC done 11/16 which showed moderate mitral valve stenosis and 100% stenosis of the prox RCA lesions, collaterals present. Plans are for TCTS evaluation for mitral valve repair. Pharmacy has been consulted to continue heparin. Heparin level supratherapeutic this AM at 0.78 on restart at 650 units/h. CBC stable, no bleed issues per RN.   Goal of Therapy:  Heparin level = 0.3-0.7 Monitor platelets by anticoagulation protocol: Yes   Plan:  Decrease heparin to 550 units/hr 8h heparin level Daily heparin level/CBC Monitor s/sx bleeding F/u TCTS plans Warfarin on hold   Elicia Lamp, PharmD, BCPS Clinical Pharmacist 03/26/2016 8:27 AM

## 2016-03-26 NOTE — Progress Notes (Signed)
Patient Name: Marie Malone Date of Encounter: 03/26/2016  Primary Cardiologist: [facility] Problem List     Principal Problem:   Transient alteration of awareness Active Problems:   HTN (hypertension)   Mitral valve stenosis, rheumatic   Atrial fibrillation (HCC)   HLD (hyperlipidemia)   Asymptomatic stenosis of right carotid artery s/p right CEA 03/10/16   Constipation   Altered mental status   Tongue deviation   Second degree Mobitz I AV block   Syncope     Subjective   Feels OK, but very tired of being hospitalized and having procedures.. No dyspnea at rest or light activity. No palpitations overnight  Inpatient Medications    Scheduled Meds: . atorvastatin  80 mg Oral q1800  . docusate sodium  100 mg Oral BID  . metoprolol succinate  25 mg Oral Daily  . polyethylene glycol  17 g Oral Daily  . sodium chloride flush  3 mL Intravenous Q12H   Continuous Infusions: . heparin 550 Units/hr (03/26/16 0856)   PRN Meds: sodium chloride, acetaminophen, ondansetron (ZOFRAN) IV, sodium chloride flush   Vital Signs    Vitals:   03/25/16 1851 03/25/16 2055 03/26/16 0500 03/26/16 0956  BP: 109/65 (!) 111/45 117/60 118/60  Pulse:  78 93 76  Resp:  18 18   Temp:  98.3 F (36.8 C) 98.2 F (36.8 C)   TempSrc:  Oral Oral   SpO2:  98% 100%   Weight:   158 lb 9.6 oz (71.9 kg)   Height:        Intake/Output Summary (Last 24 hours) at 03/26/16 1010 Last data filed at 03/26/16 0601  Gross per 24 hour  Intake           417.45 ml  Output              200 ml  Net           217.45 ml   Filed Weights   03/21/16 1504 03/24/16 1140 03/26/16 0500  Weight: 160 lb 6.4 oz (72.8 kg) 160 lb 6.4 oz (72.8 kg) 158 lb 9.6 oz (71.9 kg)    Physical Exam    Comfortable at rest WCB:JSEG nourished, well developed, in no acute distress.  HEENT:Grossly normal.  Neck:Supple, no JVD, carotid bruits, or masses. Right endarterectomy scar Cardiac:RRR, early and loud opening  snap, very faint apical rumble, no rubs or gallops. No clubbing, cyanosis, edema. Radials/DP/PT 2+ and equal bilaterally.  Respiratory:Respirations regular and unlabored, clear to auscultation bilaterally. BT:DVVO, nontender, nondistended, BS + x 4. MS:no deformity or atrophy. Skin:warm and dry, no rash. Neuro:Strength and sensation are intact. Psych:AAOx3. Normal affect.  Labs    CBC  Recent Labs  03/25/16 0621 03/26/16 0715  WBC 5.0 4.8  HGB 11.8* 10.9*  HCT 37.5 35.5*  MCV 84.7 85.5  PLT 171 184    Telemetry    NSR with occ AV Wenckebach - Personally Reviewed  Cardiac Studies   R/L heart cath 03/25/16   The left ventricular systolic function is normal.  The left ventricular ejection fraction is 55-65% by visual estimate.  There is no aortic valve stenosis.  There is moderate mitral valve stenosis. Mean gradient 10 mm Hg. MV area 1.3 cm2  Mid Cx to Dist Cx lesion, 10 %stenosed.  Ost 1st Mrg to 1st Mrg lesion, 20 %stenosed.  Prox RCA lesion, 100 %stenosed. Left to right and right to right collaterals are present.  Hemodynamic findings consistent with moderate pulmonary hypertension  and mitral valve stenosis.  LV end diastolic pressure is normal.  Fick Cardiac Output 4.12 L/min  Fick Cardiac Output Index 2.37 (L/min)/BSA  Aortic Mean Gradient 9.2 mmHg  Aortic Peak Gradient 0 mmHg  Aortic Valve Area >3.50  Aortic Value Area Index 2.01 cm2/BSA  Mitral Mean Gradient 10.1 mmHg  Mitral Peak Gradient 9 mmHg  Mitral Valve Area Index 0.76 cm2/BSA  RA A Wave 1 mmHg  RA V Wave 5 mmHg  RA Mean 1 mmHg  RV Systolic Pressure 57 mmHg  RV Diastolic Pressure 0 mmHg  RV EDP 1 mmHg  PA Systolic Pressure 66 mmHg  PA Diastolic Pressure 21 mmHg  PA Mean 36 mmHg  PW A Wave 13 mmHg  PW V Wave 15 mmHg  PW Mean 13 mmHg  AO Systolic Pressure 004 mmHg  AO Diastolic Pressure 70 mmHg  AO Mean 96 mmHg  LV Systolic Pressure 599 mmHg  LV Diastolic Pressure 0 mmHg    LV EDP 4 mmHg     Patient Profile     65 year old woman with moderate to severe mitral stenosis following commissurotomy 2008, s/p recent R CEA with recurrent syncope, possibly explained by orthostatic hypotension, concern for possible AF related syncope.  Assessment & Plan    1. Syncope: no recurrence since admission. Probably orthostatic, but some concern for AFib/MS related syncope. AF documented in past, none since this admission. Has second degree AV block, Mobitz type 1 on beta blocker in current dose, but without bradycardia or lengthy pauses. 2. Moderate to severe MS: PAP appears disproportionately elevated for PAWP and she may have a component of fixed pulmonary arteriolar disease. Nevertheless, she would benefit from relief of MS. Valve appears excessively thickened and calcified for another attempt at commissurotomy/repair. Asked Dr. Roxy Manns to evaluate for surgical valve replacement. The patient would like a little more time to recover from CEA surgery, although the option for surgery during the current admission was offered to her. 3. PAFib: Resume Warfarin. 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 On IV heparin until INR >2. 4. Sinus tachycardia: Will maintain current low dose beta blocker notwithstanding the problems with orthostatic hypotension and second degree AV block. 5. Recent R CEA: Poor postop PO intake and maybe neurovegetative impact of carotid sinus manipulation may have contributed to recent labile BP.  Signed, [name], MD  03/26/2016, 10:10 AM

## 2016-03-26 NOTE — Progress Notes (Signed)
ANTICOAGULATION CONSULT NOTE - Follow up [place] for Heparin/Coumadin Indication: atrial fibrillation  Vital Signs: Temp: 97.8 F (36.6 C) (11/17 1416) Temp Source: Oral (11/17 1416) BP: 121/62 (11/17 1416) Pulse Rate: 77 (11/17 1416)   Wt: 72.8 kg IBW: 50.1 kg Hep wt: 65.6 kg  Labs:  Recent Labs  03/24/16 0527  03/25/16 0621 03/26/16 0715 03/26/16 1720  HGB 12.1  --  11.8* 10.9*  --   HCT 38.6  --  37.5 35.5*  --   PLT 172  --  171 184  --   LABPROT 21.2*  --  19.9* 16.9*  --   INR 1.81  --  1.67 1.37  --   HEPARINUNFRC 0.84*  < > 0.68 0.78* 0.39  < > = values in this interval not displayed.  Estimated Creatinine Clearance: 63.6 mL/min (by C-G formula based on SCr of 0.83 mg/dL).  Assessment: 65 y.o. female with h/o Afib , pta  Warfarin was held for cardiac cath, started on IV heparin.. Now s/p cath coumadin resumed/continuing heparin bridge.  Heparin level tonight is 0.39 , therapeutic on 550 units/hr.  No bleeding reported.     Goal of Therapy:  Heparin level = 0.3-0.7 INR 2-3 Monitor platelets by anticoagulation protocol: Yes   Plan:  Continue heparin IV  550 units/hr  d/c heparin  when INR therapeutic  Warfarin already ordered.  Daily heparin level/CBC/INR Monitor s/sx bleeding   Nicole Cella, RPh Clinical Pharmacist Pager: [phone] 03/26/2016 5:55 PM

## 2016-03-26 NOTE — Progress Notes (Signed)
ANTICOAGULATION CONSULT NOTE - Follow up [place] for Heparin Indication: atrial fibrillation  Vital Signs: Temp: 97.8 F (36.6 C) (11/17 1416) Temp Source: Oral (11/17 1416) BP: 121/62 (11/17 1416) Pulse Rate: 77 (11/17 1416)   Wt: 72.8 kg IBW: 50.1 kg Hep wt: 65.6 kg  Labs:  Recent Labs  03/24/16 0527  03/24/16 2042 03/25/16 0621 03/26/16 0715  HGB 12.1  --   --  11.8* 10.9*  HCT 38.6  --   --  37.5 35.5*  PLT 172  --   --  171 184  LABPROT 21.2*  --   --  19.9* 16.9*  INR 1.81  --   --  1.67 1.37  HEPARINUNFRC 0.84*  < > 0.79* 0.68 0.78*  < > = values in this interval not displayed.  Estimated Creatinine Clearance: 63.6 mL/min (by C-G formula based on SCr of 0.83 mg/dL).  Assessment: 65 y.o. female with h/o Afib, warfarin on hold, heparin started in preparation for cath. LHC done 11/16 which showed moderate mitral valve stenosis and 100% stenosis of the prox RCA lesions, collaterals present. Plans are for TCTS evaluation for mitral valve repair. Pharmacy has been consulted to continue heparin. Heparin level supratherapeutic this AM at 0.78 on restart at 650 units/h. CBC stable, no bleed issues per RN.  Pharmacy asked to resume warfarin - patient no longer has pending surgery this admit. Continuing heparin bridge. INR down to 1.37.  PTA warfarin dose: 5 mg daily except 7.5 mg on Wednesdays   Goal of Therapy:  Heparin level = 0.3-0.7 INR 2-3 Monitor platelets by anticoagulation protocol: Yes   Plan:  Decrease heparin to 550 units/hr - d/c when INR therapeutic Resume warfarin 7.5mg  x 1 dose tonight 8h heparin level Daily heparin level/CBC/INR Monitor s/sx bleeding   Elicia Lamp, PharmD, BCPS Clinical Pharmacist 03/26/2016 3:46 PM

## 2016-03-26 NOTE — Progress Notes (Addendum)
Pre-op Cardiac Surgery  Carotid Findings:  S/P right CEA on 03/10/16.  Upper Extremity Right Left  Brachial Pressures 137 139  Radial Waveforms Triphasic Triphasic  Ulnar Waveforms Triphasic Triphasc  Palmar Arch (Allen's Test) WNL WNL   Findings:  Doppler waveforms remain within normal with both radial and ulnar compressions.   Oda Cogan, BS, RDMS, RVT

## 2016-03-27 DIAGNOSIS — I10 Essential (primary) hypertension: Secondary | ICD-10-CM

## 2016-03-27 LAB — COMPREHENSIVE METABOLIC PANEL
ALBUMIN: 3.5 g/dL (ref 3.5–5.0)
ALK PHOS: 65 U/L (ref 38–126)
ALT: 19 U/L (ref 14–54)
ANION GAP: 10 (ref 5–15)
AST: 22 U/L (ref 15–41)
BILIRUBIN TOTAL: 1 mg/dL (ref 0.3–1.2)
BUN: 14 mg/dL (ref 6–20)
CALCIUM: 9.4 mg/dL (ref 8.9–10.3)
CO2: 21 mmol/L — ABNORMAL LOW (ref 22–32)
Chloride: 107 mmol/L (ref 101–111)
Creatinine, Ser: 0.85 mg/dL (ref 0.44–1.00)
GFR calc Af Amer: >60 mL/min (ref >60)
GFR calc non Af Amer: >60 mL/min (ref >60)
GLUCOSE: 102 mg/dL — AB (ref 65–99)
Potassium: 4 mmol/L (ref 3.5–5.1)
Sodium: 138 mmol/L (ref 135–145)
TOTAL PROTEIN: 7.8 g/dL (ref 6.5–8.1)

## 2016-03-27 LAB — HEPARIN LEVEL (UNFRACTIONATED)
HEPARIN UNFRACTIONATED: 0.52 [IU]/mL (ref 0.30–0.70)
Heparin Unfractionated: 0.27 IU/mL — ABNORMAL LOW (ref 0.30–0.70)
Heparin Unfractionated: 0.23 IU/mL — ABNORMAL LOW (ref 0.30–0.70)

## 2016-03-27 LAB — CBC
HEMATOCRIT: 37.6 % (ref 36.0–46.0)
Hemoglobin: 11.6 g/dL — ABNORMAL LOW (ref 12.0–15.0)
MCH: 26.2 pg (ref 26.0–34.0)
MCHC: 30.9 g/dL (ref 30.0–36.0)
MCV: 84.9 fL (ref 78.0–100.0)
Platelets: 164 10*3/uL (ref 150–400)
RBC: 4.43 MIL/uL (ref 3.87–5.11)
RDW: 15.9 % — AB (ref 11.5–15.5)
WBC: 4.2 10*3/uL (ref 4.0–10.5)

## 2016-03-27 LAB — PREALBUMIN: Prealbumin: 19.1 mg/dL (ref 18–38)

## 2016-03-27 LAB — PROTIME-INR
INR: 1.24
Prothrombin Time: 15.7 seconds — ABNORMAL HIGH (ref 11.4–15.2)

## 2016-03-27 NOTE — Progress Notes (Signed)
Patient Name: Marie Malone Date of Encounter: 03/27/2016  Primary Cardiologist: [facility] Problem List     Principal Problem:   Transient alteration of awareness Active Problems:   HTN (hypertension)   Mitral valve stenosis, rheumatic   Paroxysmal atrial fibrillation (HCC)   HLD (hyperlipidemia)   Asymptomatic stenosis of right carotid artery s/p right CEA 03/10/16   Constipation   Altered mental status   Tongue deviation   Second degree Mobitz I AV block   Syncope     Subjective   Feels OK, but very tired of being hospitalized and having procedures.. No dyspnea at rest or light activity. No palpitations overnight  Inpatient Medications    Scheduled Meds: . atorvastatin  80 mg Oral q1800  . chlorhexidine  15 mL Mouth/Throat BID  . docusate sodium  100 mg Oral BID  . metoprolol succinate  25 mg Oral Daily  . polyethylene glycol  17 g Oral Daily  . sodium chloride flush  3 mL Intravenous Q12H  . Warfarin - Pharmacist Dosing Inpatient   Does not apply q1800   Continuous Infusions: . heparin 600 Units/hr (03/27/16 9629)   PRN Meds: sodium chloride, acetaminophen, ondansetron (ZOFRAN) IV, sodium chloride flush   Vital Signs    Vitals:   03/26/16 1416 03/26/16 2135 03/27/16 0500 03/27/16 0837  BP: 121/62 (!) 115/59 124/67 (!) 97/49  Pulse: 77 77 74 80  Resp: 18 18 15 18   Temp: 97.8 F (36.6 C) 98.2 F (36.8 C) 98.4 F (36.9 C) 98.8 F (37.1 C)  TempSrc: Oral Oral Oral Oral  SpO2: 95% 100%  99%  Weight:      Height:        Intake/Output Summary (Last 24 hours) at 03/27/16 1146 Last data filed at 03/27/16 0900  Gross per 24 hour  Intake              390 ml  Output              400 ml  Net              -10 ml   Filed Weights   03/21/16 1504 03/24/16 1140 03/26/16 0500  Weight: 160 lb 6.4 oz (72.8 kg) 160 lb 6.4 oz (72.8 kg) 158 lb 9.6 oz (71.9 kg)    Physical Exam    Comfortable at rest BMW:UXLK nourished, well developed, in no acute  distress.  HEENT:Grossly normal.  Neck:Supple, no JVD, carotid bruits, or masses. Right endarterectomy scar Cardiac:RRR, early and loud opening snap, very faint apical rumble, no rubs or gallops. No clubbing, cyanosis, edema. Radials/DP/PT 2+ and equal bilaterally.  Respiratory:Respirations regular and unlabored, clear to auscultation bilaterally. GM:WNUU, nontender, nondistended, BS + x 4. MS:no deformity or atrophy. Skin:warm and dry, no rash. Neuro:Strength and sensation are intact. Psych:AAOx3. Normal affect.  Labs    CBC  Recent Labs  03/26/16 0715 03/27/16 0258  WBC 4.8 4.2  HGB 10.9* 11.6*  HCT 35.5* 37.6  MCV 85.5 84.9  PLT 184 164    Telemetry    NSR with occ AV Wenckebach - Personally Reviewed  Cardiac Studies   R/L heart cath 03/25/16   The left ventricular systolic function is normal.  The left ventricular ejection fraction is 55-65% by visual estimate.  There is no aortic valve stenosis.  There is moderate mitral valve stenosis. Mean gradient 10 mm Hg. MV area 1.3 cm2  Mid Cx to Dist Cx lesion, 10 %stenosed.  Ost 1st Mrg  to 1st Mrg lesion, 20 %stenosed.  Prox RCA lesion, 100 %stenosed. Left to right and right to right collaterals are present.  Hemodynamic findings consistent with moderate pulmonary hypertension and mitral valve stenosis.  LV end diastolic pressure is normal.  Fick Cardiac Output 4.12 L/min  Fick Cardiac Output Index 2.37 (L/min)/BSA  Aortic Mean Gradient 9.2 mmHg  Aortic Peak Gradient 0 mmHg  Aortic Valve Area >3.50  Aortic Value Area Index 2.01 cm2/BSA  Mitral Mean Gradient 10.1 mmHg  Mitral Peak Gradient 9 mmHg  Mitral Valve Area Index 0.76 cm2/BSA  RA A Wave 1 mmHg  RA V Wave 5 mmHg  RA Mean 1 mmHg  RV Systolic Pressure 57 mmHg  RV Diastolic Pressure 0 mmHg  RV EDP 1 mmHg  PA Systolic Pressure 66 mmHg  PA Diastolic Pressure 21 mmHg  PA Mean 36 mmHg  PW A Wave 13 mmHg  PW V Wave 15 mmHg  PW Mean 13  mmHg  AO Systolic Pressure 376 mmHg  AO Diastolic Pressure 70 mmHg  AO Mean 96 mmHg  LV Systolic Pressure 283 mmHg  LV Diastolic Pressure 0 mmHg  LV EDP 4 mmHg     Patient Profile     65 year old woman with moderate to severe mitral stenosis following commissurotomy 2008, s/p recent R CEA with recurrent syncope, possibly explained by orthostatic hypotension, concern for possible AF related syncope.  Assessment & Plan    1. Syncope: no recurrence since admission. Probably orthostatic, but some concern for AFib/MS related syncope. AF documented in past, none since this admission. Has second degree AV block, Mobitz type 1 on beta blocker in current dose, but without bradycardia or lengthy pauses.  2. Moderate to severe MS: PAP appears disproportionately elevated for PAWP and she may have a component of fixed pulmonary arteriolar disease. Nevertheless, she would benefit from relief of MS. Valve appears excessively thickened and calcified for another attempt at commissurotomy/repair.The patient would like a little more time to recover from CEA surgery, although the option for surgery during the current admission was offered to her.  Appreciated CVTS consult.  Patient wants to wait a while for surgery but in mean time Dr. Roxy Manns is consulting dentistry to see if patient needs dental extractions that can be done this admission since INR is low.    3. PAFib: High embolic risk due to prior CVA and rheumatic MS. On IV heparin.  Will hold Coumadin at CVTS request until dental consult obtained to see if teeth can be extracted during this admission since INR is subtherapeutic.    4. Sinus tachycardia - resolved: Will maintain current low dose beta blocker notwithstanding the problems with orthostatic hypotension and second degree AV block.  5. Recent R CEA: Poor postop PO intake and maybe neurovegetative impact of carotid sinus manipulation may have contributed to recent labile BP.  Signed, Fransico Him, MD  03/27/2016, 11:46 AM

## 2016-03-27 NOTE — Progress Notes (Signed)
ANTICOAGULATION CONSULT NOTE - Follow up [place] for Heparin/Coumadin Indication: atrial fibrillation  Vital Signs: Temp: 98.2 F (36.8 C) (11/17 2135) Temp Source: Oral (11/17 2135) BP: 115/59 (11/17 2135) Pulse Rate: 77 (11/17 2135)   Wt: 72.8 kg IBW: 50.1 kg Hep wt: 65.6 kg  Labs:  Recent Labs  03/25/16 0621 03/26/16 0715 03/26/16 1720 03/27/16 0258  HGB 11.8* 10.9*  --  11.6*  HCT 37.5 35.5*  --  37.6  PLT 171 184  --  164  LABPROT 19.9* 16.9*  --  15.7*  INR 1.67 1.37  --  1.24  HEPARINUNFRC 0.68 0.78* 0.39 0.27*    Estimated Creatinine Clearance: 63.6 mL/min (by C-G formula based on SCr of 0.83 mg/dL).  Assessment: 65 y.o. female with h/o Afib , pta  Warfarin was held for cardiac cath, started on IV heparin.  Now s/p cath coumadin resumed/continuing heparin bridge.   Heparin level slightly subtherapeutic this morning at 0.27. CBC stable with no reported bleeding.    Goal of Therapy:  Heparin level 0.3-0.7 INR 2-3 Monitor platelets by anticoagulation protocol: Yes   Plan:  1. Increase heparin infusion to 600 units/hr  2. Repeat heparin level in 6 hours    Vincenza Hews, PharmD, BCPS 03/27/2016, 4:20 AM Pager: [phone]

## 2016-03-27 NOTE — Progress Notes (Signed)
ANTICOAGULATION CONSULT NOTE - Follow up [place] for Heparin Indication: atrial fibrillation  Vital Signs: Temp: 98.4 F (36.9 C) (11/18 1247) Temp Source: Oral (11/18 1247) BP: 103/54 (11/18 1247) Pulse Rate: 82 (11/18 1247)   Wt: 72.8 kg IBW: 50.1 kg Hep wt: 65.6 kg  Labs:  Recent Labs  03/25/16 0621 03/26/16 0715 03/26/16 1720 03/27/16 0258 03/27/16 1043  HGB 11.8* 10.9*  --  11.6*  --   HCT 37.5 35.5*  --  37.6  --   PLT 171 184  --  164  --   LABPROT 19.9* 16.9*  --  15.7*  --   INR 1.67 1.37  --  1.24  --   HEPARINUNFRC 0.68 0.78* 0.39 0.27* 0.23*  CREATININE  --   --   --  0.85  --     Estimated Creatinine Clearance: 62.1 mL/min (by C-G formula based on SCr of 0.85 mg/dL).  Assessment: 65 y.o. female with h/o Afib, warfarin on hold, heparin started in preparation for cath. LHC done 11/16 which showed moderate mitral valve stenosis and 100% stenosis of the prox RCA lesions, collaterals present. Plans are for TCTS evaluation for mitral valve repair. Pharmacy has been consulted to continue heparin. Heparin level remains subtherapeutic after rate adjustment. CBC stable, no bleeding issues noted.  Pharmacy was asked to resume warfarin on 11/17 but orders noted to hold warfarin again on 11/18 pending dental extraction evaluation.  PTA warfarin dose: 5 mg daily except 7.5 mg on Wednesdays   Goal of Therapy:  Heparin level = 0.3-0.7 INR 2-3 Monitor platelets by anticoagulation protocol: Yes   Plan:  Increase Heparin to 700 units/hr 8h heparin level Daily heparin level/CBC/INR Monitor s/sx bleeding  Legrand Como, Pharm.D., BCPS, AAHIVP Clinical Pharmacist Phone: [phone] or [phone] 03/27/2016, 1:14 PM

## 2016-03-27 NOTE — Progress Notes (Signed)
Cardiac Rehab Phase I  Pre-operative valve  education completed with patient and family, stressed sternal precautions, IS, DB&C, and importance of ambulation.  Has concerns at discharge after surgery if she can go to rehab d/t her husband's poor health.  Acknowledged understanding.  Given pre-op surgery booklet. [place]

## 2016-03-27 NOTE — Progress Notes (Signed)
ANTICOAGULATION CONSULT NOTE - Follow up [place] for Heparin Indication: atrial fibrillation  Vital Signs: Temp: 98.3 F (36.8 C) (11/18 1949) Temp Source: Oral (11/18 1949) BP: 126/58 (11/18 1949) Pulse Rate: 61 (11/18 1949)   Wt: 72.8 kg IBW: 50.1 kg Hep wt: 65.6 kg  Labs:  Recent Labs  03/25/16 0621 03/26/16 0715  03/27/16 0258 03/27/16 1043 03/27/16 2241  HGB 11.8* 10.9*  --  11.6*  --   --   HCT 37.5 35.5*  --  37.6  --   --   PLT 171 184  --  164  --   --   LABPROT 19.9* 16.9*  --  15.7*  --   --   INR 1.67 1.37  --  1.24  --   --   HEPARINUNFRC 0.68 0.78*  < > 0.27* 0.23* 0.52  CREATININE  --   --   --  0.85  --   --   < > = values in this interval not displayed.  Estimated Creatinine Clearance: 62.1 mL/min (by C-G formula based on SCr of 0.85 mg/dL).  Assessment: 65 y.o. female with h/o Afib, warfarin on hold, heparin started in preparation for cath.  LHC done 11/16 which showed moderate mitral valve stenosis and 100% stenosis of the prox RCA lesions, collaterals present. Plans are for TCTS evaluation for mitral valve repair.  Pharmacy has been consulted to continue heparin.  Heparin level now therapeutic after rate adjustment.  CBC stable, no bleeding issues noted.  Warfarin remains on hold pending dental extraction evaluation.  PTA warfarin dose: 5 mg daily except 7.5 mg on Wednesdays  Goal of Therapy:  Heparin level = 0.3-0.7 Monitor platelets by anticoagulation protocol: Yes   Plan:  1. Continue heparin infusion at 700 units/hr 2. Heparin level in am to serve as confirmatory 3. Daily heparin level/CBC 4. Monitor s/sx bleeding  Vincenza Hews, PharmD, BCPS 03/27/2016, 11:53 PM Pager: [phone]

## 2016-03-27 NOTE — Progress Notes (Signed)
Patient ID: Marie Malone, female   DOB: 03/05/1951, 65 y.o.   MRN: [id number]    PROGRESS NOTE    Marie Malone  YTK:[id number] DOB: [birth year] DOA: 03/21/2016  PCP: Marie Pear., NP   Brief Narrative:  65 y.o. female with medical history significant for hypertension, dyslipidemia, known mitral stenosis and moderate to severe pulmonary hypertension, history of prior CVA, recent right carotid endarterectomy on 11/1, A2 fibrillation on warfarin, prior aortic valvulotomy in 2008. Patient has been having issues with persistent headache and operative site discomfort since the surgical procedure. Patient took 2 Tylenol PM's prior to going to bed around 11 PM and woke up just before 2 AM and ask for water. When her husband returned patient was sitting up in the bed but "staring off"and would not respond to him.  Assessment & Plan: Near syncope, secondary to orthostatics and ? A-fib/MS related  - no recurrence since admission - Has second degree AV block, Mobitz type 1 on beta blocker but without bradycardia or lengthy pauses - keep on tele for now - ambulate as tolerated, pt says she has been walking to the bathroom with no difficulties   Moderate (to severe?) MS - gradients are high on TEE - pt is now status post right/left cardiac cath - conclusion below  The left ventricular systolic function is normal.  The left ventricular ejection fraction is 55-65% by visual estimate.  There is no aortic valve stenosis.  There is moderate mitral valve stenosis. Mean gradient 10 mm Hg. MV area 1.3 cm2  Hemodynamic findings consistent with moderate pulmonary hypertension and mitral valve stenosis. - continue to follow up on cardiology team recommendations  - surgical eval to determine need for Mitral valve repair, TCTS consulted, pt will need dental extraction, I see no coverage for oral surgeon over the weekend, so will have to wait for this until Monday unless TCTS already consulted  dentist, will discuss with TCTS - Dr. Gwyndolyn Kaufman listed in [place] as on call doc for general adult dent, I left a message - pt is in agreement to proceed with surgery   PAFib - currently on Heparin, suspect we can resume Coumadin when cardiology agrees and it depends on when surgery will be done   Sinus tachycardia - resolved, HR at target range, in 80's this AM - continue Metoprolol 25 mg PO QD  Recent R CEA - Poor postop PO intake and maybe neurovegetative impact of carotid sinus manipulation may have contributed to recent labile BP - BP currently on low end of normal   DVT prophylaxis: Heparin --> transition to Coumadin when cardiology OK's Code Status: Full Family Communication: Patient and husband at bedside  Disposition Plan: depends on cardiology and TCTS teams  Consultants:   Cardiology   TCTS  Procedures:   Cardiac cath 11/16 -->  Antimicrobials:   None  Subjective: No events overnight.   Objective: Vitals:   03/26/16 1416 03/26/16 2135 03/27/16 0500 03/27/16 0837  BP: 121/62 (!) 115/59 124/67 (!) 97/49  Pulse: 77 77 74 80  Resp: 18 18 15 18   Temp: 97.8 F (36.6 C) 98.2 F (36.8 C) 98.4 F (36.9 C) 98.8 F (37.1 C)  TempSrc: Oral Oral Oral Oral  SpO2: 95% 100%  99%  Weight:      Height:        Intake/Output Summary (Last 24 hours) at 03/27/16 1152 Last data filed at 03/27/16 0900  Gross per 24 hour  Intake  390 ml  Output              400 ml  Net              -10 ml   Filed Weights   03/21/16 1504 03/24/16 1140 03/26/16 0500  Weight: 72.8 kg (160 lb 6.4 oz) 72.8 kg (160 lb 6.4 oz) 71.9 kg (158 lb 9.6 oz)    Examination:  General exam: Appears calm and comfortable  Respiratory system: Clear to auscultation. Respiratory effort normal. Cardiovascular system: RRR. No pedal edema. Gastrointestinal system: Abdomen is nondistended, soft and nontender. No organomegaly or masses felt. Normal bowel sounds heard. Central nervous system: Alert  and oriented. No focal neurological deficits. Extremities: Symmetric 5 x 5 power.  Data Reviewed: I have personally reviewed following labs and imaging studies  CBC:  Recent Labs Lab 03/22/16 0905 03/24/16 0527 03/25/16 0621 03/26/16 0715 03/27/16 0258  WBC 4.4 6.5 5.0 4.8 4.2  HGB 12.3 12.1 11.8* 10.9* 11.6*  HCT 39.5 38.6 37.5 35.5* 37.6  MCV 84.6 85.0 84.7 85.5 84.9  PLT 243 172 171 184 950   Basic Metabolic Panel:  Recent Labs Lab 03/21/16 0641 03/22/16 0905 03/27/16 0258  NA 137 139 138  K 3.9 3.7 4.0  CL 104 104 107  CO2 22 25 21*  GLUCOSE 101* 103* 102*  BUN 16 15 14   CREATININE 0.82 0.83 0.85  CALCIUM 9.7 9.6 9.4   Coagulation Profile:  Recent Labs Lab 03/23/16 0523 03/24/16 0527 03/25/16 0621 03/26/16 0715 03/27/16 0258  INR 1.95 1.81 1.67 1.37 1.24   BNP (last 3 results)  Recent Labs  10/10/15 1343 01/20/16 1539  PROBNP 238.0* 136.0*   Radiology Studies: Dg Orthopantogram  Result Date: 03/26/2016 CLINICAL DATA:  Pt states she is a ? Pre op CABG, had recent carotid surgery, kept passing out after, and is now a candidate for a CABG, all of her teeth hurt,and her bottom teeth are loose. Pt states no chest complaints. Hx of HTN. EXAM: ORTHOPANTOGRAM/PANORAMIC COMPARISON:  None. FINDINGS: No fracture.  No bone lesion. There are multiple missing maxillary and mandibular teeth, with all molars missing from the left mandible and 1 missing molar from the right mandible. There is periapical lucency at the roots of the left canine and premolar mandibular teeth, and probably also from the right mandibular premolar, less well-visualized. IMPRESSION: 1. No fracture or bone lesion. 2. Dental disease with mild periapical lucency involving several mandibular teeth. There multiple missing teeth. Electronically Signed   By: Lajean Manes M.D.   On: 03/26/2016 13:26   Dg Chest 2 View  Result Date: 03/26/2016 CLINICAL DATA:  Coronary artery disease EXAM: CHEST  2  VIEW COMPARISON:  Oct 07, 2015 FINDINGS: There is no edema or consolidation. Heart size and pulmonary vascularity are normal. No adenopathy. There is atherosclerotic calcification in the aorta. No bone lesions are evident. IMPRESSION: No edema or consolidation.  There is aortic atherosclerosis. Electronically Signed   By: Lowella Grip III M.D.   On: 03/26/2016 13:26    Scheduled Meds: . atorvastatin  80 mg Oral q1800  . chlorhexidine  15 mL Mouth/Throat BID  . docusate sodium  100 mg Oral BID  . metoprolol succinate  25 mg Oral Daily  . polyethylene glycol  17 g Oral Daily  . sodium chloride flush  3 mL Intravenous Q12H  . Warfarin - Pharmacist Dosing Inpatient   Does not apply q1800   Continuous Infusions: . heparin 600 Units/hr (  03/27/16 7207)    LOS: 4 days   Time spent: 20 minutes   Faye Ramsay, MD [facility] Pager [phone]  If 7PM-7AM, please contact night-coverage [email or web address] Password [email or web address] 03/27/2016, 11:52 AM

## 2016-03-28 LAB — BASIC METABOLIC PANEL
ANION GAP: 11 (ref 5–15)
BUN: 13 mg/dL (ref 6–20)
CHLORIDE: 109 mmol/L (ref 101–111)
CO2: 20 mmol/L — AB (ref 22–32)
Calcium: 9.6 mg/dL (ref 8.9–10.3)
Creatinine, Ser: 0.82 mg/dL (ref 0.44–1.00)
GFR calc non Af Amer: >60 mL/min (ref >60)
Glucose, Bld: 93 mg/dL (ref 65–99)
POTASSIUM: 3.8 mmol/L (ref 3.5–5.1)
Sodium: 140 mmol/L (ref 135–145)

## 2016-03-28 LAB — HEPARIN LEVEL (UNFRACTIONATED): Heparin Unfractionated: 0.45 IU/mL (ref 0.30–0.70)

## 2016-03-28 LAB — CBC
HEMATOCRIT: 36.4 % (ref 36.0–46.0)
HEMOGLOBIN: 11.3 g/dL — AB (ref 12.0–15.0)
MCH: 26.3 pg (ref 26.0–34.0)
MCHC: 31 g/dL (ref 30.0–36.0)
MCV: 84.8 fL (ref 78.0–100.0)
Platelets: 141 10*3/uL — ABNORMAL LOW (ref 150–400)
RBC: 4.29 MIL/uL (ref 3.87–5.11)
RDW: 16.1 % — AB (ref 11.5–15.5)
WBC: 4 10*3/uL (ref 4.0–10.5)

## 2016-03-28 LAB — HEMOGLOBIN A1C
Hgb A1c MFr Bld: 5.9 % — ABNORMAL HIGH (ref 4.8–5.6)
Mean Plasma Glucose: 123 mg/dL

## 2016-03-28 LAB — PROTIME-INR
INR: 1.4
Prothrombin Time: 17.2 seconds — ABNORMAL HIGH (ref 11.4–15.2)

## 2016-03-28 MED ORDER — WARFARIN SODIUM 5 MG PO TABS
5.0000 mg | ORAL_TABLET | Freq: Once | ORAL | Status: AC
  Administered 2016-03-28: 5 mg via ORAL
  Filled 2016-03-28: qty 1

## 2016-03-28 MED ORDER — WARFARIN - PHARMACIST DOSING INPATIENT
Freq: Every day | Status: DC
  Administered 2016-03-28: 18:00:00

## 2016-03-28 NOTE — Progress Notes (Addendum)
Patient Name: Marie Malone Date of Encounter: 03/28/2016  Primary Cardiologist: [facility] Problem List     Principal Problem:   Transient alteration of awareness Active Problems:   HTN (hypertension)   Mitral valve stenosis, rheumatic   Paroxysmal atrial fibrillation (HCC)   HLD (hyperlipidemia)   Asymptomatic stenosis of right carotid artery s/p right CEA 03/10/16   Constipation   Altered mental status   Tongue deviation   Second degree Mobitz I AV block   Syncope     Subjective   Feels OK, but very tired of being hospitalized and having procedures.. No dyspnea at rest or light activity. No palpitations overnight  Inpatient Medications    Scheduled Meds: . atorvastatin  80 mg Oral q1800  . chlorhexidine  15 mL Mouth/Throat BID  . docusate sodium  100 mg Oral BID  . metoprolol succinate  25 mg Oral Daily  . polyethylene glycol  17 g Oral Daily  . sodium chloride flush  3 mL Intravenous Q12H   Continuous Infusions: . heparin 700 Units/hr (03/27/16 1315)   PRN Meds: sodium chloride, acetaminophen, ondansetron (ZOFRAN) IV, sodium chloride flush   Vital Signs    Vitals:   03/27/16 0837 03/27/16 1247 03/27/16 1949 03/28/16 0620  BP: (!) 97/49 (!) 103/54 (!) 126/58 111/68  Pulse: 80 82 61 75  Resp: 18  18 18   Temp: 98.8 F (37.1 C) 98.4 F (36.9 C) 98.3 F (36.8 C) 98.3 F (36.8 C)  TempSrc: Oral Oral Oral Oral  SpO2: 99% 100% 100% 100%  Weight:      Height:        Intake/Output Summary (Last 24 hours) at 03/28/16 0842 Last data filed at 03/28/16 0228  Gross per 24 hour  Intake           202.27 ml  Output                0 ml  Net           202.27 ml   Filed Weights   03/21/16 1504 03/24/16 1140 03/26/16 0500  Weight: 160 lb 6.4 oz (72.8 kg) 160 lb 6.4 oz (72.8 kg) 158 lb 9.6 oz (71.9 kg)    Physical Exam    Comfortable at rest QBH:ALPF nourished, well developed, in no acute distress.  HEENT:Grossly normal.  Neck:Supple, no JVD,  carotid bruits, or masses. Right endarterectomy scar Cardiac:RRR, early and loud opening snap, very faint apical rumble, no rubs or gallops. No clubbing, cyanosis, edema. Radials/DP/PT 2+ and equal bilaterally.  Respiratory:Respirations regular and unlabored, clear to auscultation bilaterally. XT:KWIO, nontender, nondistended, BS + x 4. MS:no deformity or atrophy. Skin:warm and dry, no rash. Neuro:Strength and sensation are intact. Psych:AAOx3. Normal affect.  Labs    CBC  Recent Labs  03/27/16 0258 03/28/16 0336  WBC 4.2 4.0  HGB 11.6* 11.3*  HCT 37.6 36.4  MCV 84.9 84.8  PLT 164 141*    Telemetry    NSR with occ AV Wenckebach - Personally Reviewed  Cardiac Studies   R/L heart cath 03/25/16   The left ventricular systolic function is normal.  The left ventricular ejection fraction is 55-65% by visual estimate.  There is no aortic valve stenosis.  There is moderate mitral valve stenosis. Mean gradient 10 mm Hg. MV area 1.3 cm2  Mid Cx to Dist Cx lesion, 10 %stenosed.  Ost 1st Mrg to 1st Mrg lesion, 20 %stenosed.  Prox RCA lesion, 100 %stenosed. Left to right and  right to right collaterals are present.  Hemodynamic findings consistent with moderate pulmonary hypertension and mitral valve stenosis.  LV end diastolic pressure is normal.  Fick Cardiac Output 4.12 L/min  Fick Cardiac Output Index 2.37 (L/min)/BSA  Aortic Mean Gradient 9.2 mmHg  Aortic Peak Gradient 0 mmHg  Aortic Valve Area >3.50  Aortic Value Area Index 2.01 cm2/BSA  Mitral Mean Gradient 10.1 mmHg  Mitral Peak Gradient 9 mmHg  Mitral Valve Area Index 0.76 cm2/BSA  RA A Wave 1 mmHg  RA V Wave 5 mmHg  RA Mean 1 mmHg  RV Systolic Pressure 57 mmHg  RV Diastolic Pressure 0 mmHg  RV EDP 1 mmHg  PA Systolic Pressure 66 mmHg  PA Diastolic Pressure 21 mmHg  PA Mean 36 mmHg  PW A Wave 13 mmHg  PW V Wave 15 mmHg  PW Mean 13 mmHg  AO Systolic Pressure 240 mmHg  AO Diastolic Pressure  70 mmHg  AO Mean 96 mmHg  LV Systolic Pressure 973 mmHg  LV Diastolic Pressure 0 mmHg  LV EDP 4 mmHg     Patient Profile     65 year old woman with moderate to severe mitral stenosis following commissurotomy 2008, s/p recent R CEA with recurrent syncope, possibly explained by orthostatic hypotension, concern for possible AF related syncope.  Assessment & Plan    1. Syncope: no recurrence since admission. Probably orthostatic, but some concern for AFib/MS related syncope. AF documented in past, none since this admission. Has second degree AV block, Mobitz type 1 on beta blocker in current dose, but without bradycardia or lengthy pauses.  2. Moderate to severe MS: PAP appears disproportionately elevated for PAWP and she may have a component of fixed pulmonary arteriolar disease. Nevertheless, she would benefit from relief of MS. Valve appears excessively thickened and calcified for another attempt at commissurotomy/repair. Initially the patient wanted  a little more time to recover from CEA surgery, although the option for surgery during the current admission was offered to her.  She has now changed her mind and wants the surgery sooner.  Dr. Roxy Manns tried to get in touch with Dr. Lawana Chambers but thinks he may be out of town.  Will restart Coumadin.  I have discussed the plan today with Dr. Roxy Manns.  The patient will not be able to get her MV surgery this week and has to have teeth evaluated and pulled prior to surgery anyway.  I will restart Coumadin today.  Will try to get in touch with Dr. Lawana Chambers tomorrow.  Her INR is low so one dose of Coumadin will not impact the ability to do teeth extractions early this week if dentistry is available.  Will try to call Dr. Lawana Chambers again tomorrow am.  If he is out of town then plan would be to continue to load coumadin and get dental evaluation as outpt.    3. PAFib: High embolic risk due to prior CVA and rheumatic MS. On IV heparin.  Will restart coumadin as  above.  4. Sinus tachycardia - resolved: Will maintain current low dose beta blocker notwithstanding the problems with orthostatic hypotension and second degree AV block.  5. Recent R CEA: Poor postop PO intake and maybe neurovegetative impact of carotid sinus manipulation may have contributed to recent labile BP.  Signed, Fransico Him, MD  03/28/2016, 8:42 AM

## 2016-03-28 NOTE — Progress Notes (Addendum)
ANTICOAGULATION CONSULT NOTE - Follow up [place] for Heparin >> warfarin restart Indication: atrial fibrillation  Vital Signs: Temp: 98.3 F (36.8 C) (11/19 0620) Temp Source: Oral (11/19 0620) BP: 111/68 (11/19 0620) Pulse Rate: 75 (11/19 0620)   Wt: 72.8 kg IBW: 50.1 kg Hep wt: 65.6 kg  Labs:  Recent Labs  03/26/16 0715  03/27/16 0258 03/27/16 1043 03/27/16 2241 03/28/16 0336  HGB 10.9*  --  11.6*  --   --  11.3*  HCT 35.5*  --  37.6  --   --  36.4  PLT 184  --  164  --   --  141*  LABPROT 16.9*  --  15.7*  --   --  17.2*  INR 1.37  --  1.24  --   --  1.40  HEPARINUNFRC 0.78*  < > 0.27* 0.23* 0.52 0.45  CREATININE  --   --  0.85  --   --  0.82  < > = values in this interval not displayed.  Estimated Creatinine Clearance: 64.3 mL/min (by C-G formula based on SCr of 0.82 mg/dL).  Assessment: 65 y.o. female with h/o Afib, PTA warfarin on hold, heparin started for [facility] cath done on 11/16 which showed moderate mitral valve stenosis and 100% stenosis of the prox RCA lesions, collaterals present. Patient was offered mitral valve repair but will not be able to get repair this week and has to have dental evaluation prior to surgery. Will get a dental consult in the mean time for possible extractions as an outpatient. Per Dr. Theodosia Blender note: INR is low so one dose of coumadin will note impact the ability to do teeth extractions early this week if dentistry is available. Pharmacy has been consulted to continue heparin and restart warfarin.  Heparin level now therapeutic 0.45.  CBC stable, no bleeding issues noted.  INR 1.4 today after 1 dose of warfarin 7.5 mg on 11/17.   PTA warfarin dose: 5 mg daily except 7.5 mg on Wednesdays  Goal of Therapy:  Heparin level = 0.3-0.7 Monitor platelets by anticoagulation protocol: Yes   Plan:  1. Continue heparin infusion at 700 units/hr 2. Warfarin 5 mg PO tonight 3. Daily heparin level/CBC/INR 4. Monitor s/sx bleeding,  DDI, PO intake 5. F/u dental plans   Carlean Jews, Pharm.D. PGY1 Pharmacy Resident 11/19/20179:05 AM Pager [phone]

## 2016-03-28 NOTE — Progress Notes (Signed)
TCTS BRIEF PROGRESS NOTE   Progress noted Patient has not yet been seen by dentist and attempts to reach Dr. Lawana Chambers on Friday were unsuccessful - he may be out of town This was communicated to Dr. Sallyanne Kuster on Friday and I suggested that it may be that coumadin should be resumed I communicated this with [name] yesterday afternoon Will try to reach Dr. Lawana Chambers again tomorrow  Rexene Alberts, MD 03/28/2016 7:27 AM

## 2016-03-28 NOTE — Progress Notes (Signed)
Patient ID: Marie Malone, female   DOB: Apr 08, 1951, 65 y.o.   MRN: [id number]    PROGRESS NOTE    Marie Malone  XKG:[id number] DOB: 1951-01-10 DOA: 03/21/2016  PCP: Marie Malone., NP   Brief Narrative:  65 y.o. female with medical history significant for hypertension, dyslipidemia, known mitral stenosis and moderate to severe pulmonary hypertension, history of prior CVA, recent right carotid endarterectomy on 11/1, A2 fibrillation on warfarin, prior aortic valvulotomy in 2008. Patient has been having issues with persistent headache and operative site discomfort since the surgical procedure. Patient took 2 Tylenol PM's prior to going to bed around 11 PM and woke up just before 2 AM and ask for water. When her husband returned patient was sitting up in the bed but "staring off"and would not respond to him.  Assessment & Plan: Near syncope, secondary to orthostatics and ? A-fib/MS related  - no recurrence since admission - Has second degree AV block, Mobitz type 1 on beta blocker but without bradycardia or lengthy pauses - keep on tele for now - ambulate as tolerated, pt says she has been walking to the bathroom with no difficulties   Moderate (to severe?) MS - gradients are high on TEE - pt is now status post right/left cardiac cath - conclusion below  The left ventricular systolic function is normal.  The left ventricular ejection fraction is 55-65% by visual estimate.  There is no aortic valve stenosis.  There is moderate mitral valve stenosis. Mean gradient 10 mm Hg. MV area 1.3 cm2  Hemodynamic findings consistent with moderate pulmonary hypertension and mitral valve stenosis. - continue to follow up on cardiology team recommendations  - surgical eval to determine need for Mitral valve repair, TCTS consulted, pt will need dental extraction, I see no coverage for oral surgeon over the weekend, so will have to wait for this until Monday  - pt in agreement to proceed  with surgery   PAFib - currently on Heparin, suspect we can resume Coumadin when cardiology agrees and it depends on when surgery will be done   Sinus tachycardia - resolved, HR at target range, in 80's this AM - continue Metoprolol 25 mg PO QD  Recent R CEA - Poor postop PO intake and maybe neurovegetative impact of carotid sinus manipulation may have contributed to recent labile BP - BP currently on low end of normal   DVT prophylaxis: Heparin --> transition to Coumadin when cardiology OK's Code Status: Full Family Communication: Patient and husband at bedside  Disposition Plan: depends on cardiology and TCTS teams  Consultants:   Cardiology   TCTS  Procedures:   Cardiac cath 11/16 -->  Antimicrobials:   None  Subjective: No events overnight.   Objective: Vitals:   03/27/16 1247 03/27/16 1949 03/28/16 0620 03/28/16 0914  BP: (!) 103/54 (!) 126/58 111/68 (!) 111/43  Pulse: 82 61 75 72  Resp:  18 18   Temp: 98.4 F (36.9 C) 98.3 F (36.8 C) 98.3 F (36.8 C) 97.7 F (36.5 C)  TempSrc: Oral Oral Oral Oral  SpO2: 100% 100% 100% 100%  Weight:      Height:        Intake/Output Summary (Last 24 hours) at 03/28/16 1107 Last data filed at 03/28/16 0900  Gross per 24 hour  Intake           202.27 ml  Output                0 ml  Net           202.27 ml   Filed Weights   03/21/16 1504 03/24/16 1140 03/26/16 0500  Weight: 72.8 kg (160 lb 6.4 oz) 72.8 kg (160 lb 6.4 oz) 71.9 kg (158 lb 9.6 oz)    Examination:  General exam: Appears calm and comfortable  Respiratory system: Clear to auscultation. Respiratory effort normal. Cardiovascular system: RRR. No pedal edema. Gastrointestinal system: Abdomen is nondistended, soft and nontender. No organomegaly or masses felt. Normal bowel sounds heard. Central nervous system: Alert and oriented. No focal neurological deficits. Extremities: Symmetric 5 x 5 power.  Data Reviewed: I have personally reviewed following  labs and imaging studies  CBC:  Recent Labs Lab 03/24/16 0527 03/25/16 0621 03/26/16 0715 03/27/16 0258 03/28/16 0336  WBC 6.5 5.0 4.8 4.2 4.0  HGB 12.1 11.8* 10.9* 11.6* 11.3*  HCT 38.6 37.5 35.5* 37.6 36.4  MCV 85.0 84.7 85.5 84.9 84.8  PLT 172 171 184 164 937*   Basic Metabolic Panel:  Recent Labs Lab 03/22/16 0905 03/27/16 0258 03/28/16 0336  NA 139 138 140  K 3.7 4.0 3.8  CL 104 107 109  CO2 25 21* 20*  GLUCOSE 103* 102* 93  BUN 15 14 13   CREATININE 0.83 0.85 0.82  CALCIUM 9.6 9.4 9.6   Coagulation Profile:  Recent Labs Lab 03/24/16 0527 03/25/16 0621 03/26/16 0715 03/27/16 0258 03/28/16 0336  INR 1.81 1.67 1.37 1.24 1.40   BNP (last 3 results)  Recent Labs  10/10/15 1343 01/20/16 1539  PROBNP 238.0* 136.0*   Radiology Studies: Dg Orthopantogram  Result Date: 03/26/2016 CLINICAL DATA:  Pt states she is a ? Pre op CABG, had recent carotid surgery, kept passing out after, and is now a candidate for a CABG, all of her teeth hurt,and her bottom teeth are loose. Pt states no chest complaints. Hx of HTN. EXAM: ORTHOPANTOGRAM/PANORAMIC COMPARISON:  None. FINDINGS: No fracture.  No bone lesion. There are multiple missing maxillary and mandibular teeth, with all molars missing from the left mandible and 1 missing molar from the right mandible. There is periapical lucency at the roots of the left canine and premolar mandibular teeth, and probably also from the right mandibular premolar, less well-visualized. IMPRESSION: 1. No fracture or bone lesion. 2. Dental disease with mild periapical lucency involving several mandibular teeth. There multiple missing teeth. Electronically Signed   By: Lajean Manes M.D.   On: 03/26/2016 13:26   Dg Chest 2 View  Result Date: 03/26/2016 CLINICAL DATA:  Coronary artery disease EXAM: CHEST  2 VIEW COMPARISON:  Oct 07, 2015 FINDINGS: There is no edema or consolidation. Heart size and pulmonary vascularity are normal. No  adenopathy. There is atherosclerotic calcification in the aorta. No bone lesions are evident. IMPRESSION: No edema or consolidation.  There is aortic atherosclerosis. Electronically Signed   By: Lowella Grip III M.D.   On: 03/26/2016 13:26    Scheduled Meds: . atorvastatin  80 mg Oral q1800  . chlorhexidine  15 mL Mouth/Throat BID  . docusate sodium  100 mg Oral BID  . metoprolol succinate  25 mg Oral Daily  . polyethylene glycol  17 g Oral Daily  . sodium chloride flush  3 mL Intravenous Q12H  . warfarin  5 mg Oral ONCE-1800  . Warfarin - Pharmacist Dosing Inpatient   Does not apply q1800   Continuous Infusions: . heparin 700 Units/hr (03/27/16 1315)    LOS: 5 days   Time spent: 20 minutes  Faye Ramsay, MD [facility] Pager [phone]  If 7PM-7AM, please contact night-coverage [email or web address] Password [email or web address] 03/28/2016, 11:07 AM

## 2016-03-29 ENCOUNTER — Encounter (HOSPITAL_COMMUNITY): Payer: Self-pay | Admitting: Dentistry

## 2016-03-29 ENCOUNTER — Inpatient Hospital Stay (HOSPITAL_COMMUNITY)
Admit: 2016-03-29 | Discharge: 2016-03-29 | Disposition: A | Discharge status: Home / Self Care | Payer: BC Managed Care – PPO | Attending: Thoracic Surgery (Cardiothoracic Vascular Surgery) | Admitting: Thoracic Surgery (Cardiothoracic Vascular Surgery)

## 2016-03-29 DIAGNOSIS — I050 Rheumatic mitral stenosis: Secondary | ICD-10-CM

## 2016-03-29 DIAGNOSIS — Z01818 Encounter for other preprocedural examination: Secondary | ICD-10-CM

## 2016-03-29 LAB — CBC
HCT: 35.4 % — ABNORMAL LOW (ref 36.0–46.0)
Hemoglobin: 10.9 g/dL — ABNORMAL LOW (ref 12.0–15.0)
MCH: 26.3 pg (ref 26.0–34.0)
MCHC: 30.8 g/dL (ref 30.0–36.0)
MCV: 85.3 fL (ref 78.0–100.0)
PLATELETS: 139 10*3/uL — AB (ref 150–400)
RBC: 4.15 MIL/uL (ref 3.87–5.11)
RDW: 16.3 % — AB (ref 11.5–15.5)
WBC: 4 10*3/uL (ref 4.0–10.5)

## 2016-03-29 LAB — PULMONARY FUNCTION TEST
DL/VA % pred: 71 %
DL/VA: 3.26 ml/min/mmHg/L
DLCO COR % PRED: 51 %
DLCO COR: 11.2 ml/min/mmHg
DLCO UNC % PRED: 47 %
DLCO UNC: 10.23 ml/min/mmHg
FEF 25-75 POST: 1.73 L/s
FEF 25-75 PRE: 1.73 L/s
FEF2575-%Change-Post: 0 %
FEF2575-%PRED-PRE: 97 %
FEF2575-%Pred-Post: 97 %
FEV1-%Change-Post: 0 %
FEV1-%PRED-POST: 97 %
FEV1-%PRED-PRE: 97 %
FEV1-POST: 1.76 L
FEV1-Pre: 1.76 L
FEV1FVC-%Change-Post: 4 %
FEV1FVC-%PRED-PRE: 101 %
FEV6-%CHANGE-POST: -3 %
FEV6-%PRED-POST: 94 %
FEV6-%PRED-PRE: 97 %
FEV6-POST: 2.1 L
FEV6-Pre: 2.18 L
FEV6FVC-%CHANGE-POST: 0 %
FEV6FVC-%PRED-POST: 104 %
FEV6FVC-%Pred-Pre: 103 %
FVC-%Change-Post: -4 %
FVC-%PRED-POST: 90 %
FVC-%PRED-PRE: 94 %
FVC-POST: 2.1 L
FVC-PRE: 2.19 L
POST FEV6/FVC RATIO: 100 %
PRE FEV1/FVC RATIO: 80 %
Post FEV1/FVC ratio: 84 %
Pre FEV6/FVC Ratio: 99 %
RV % pred: 83 %
RV: 1.65 L
TLC % PRED: 80 %
TLC: 3.82 L

## 2016-03-29 LAB — BASIC METABOLIC PANEL
ANION GAP: 8 (ref 5–15)
BUN: 13 mg/dL (ref 6–20)
CALCIUM: 9.4 mg/dL (ref 8.9–10.3)
CO2: 21 mmol/L — AB (ref 22–32)
CREATININE: 0.83 mg/dL (ref 0.44–1.00)
Chloride: 110 mmol/L (ref 101–111)
GFR calc Af Amer: >60 mL/min (ref >60)
GLUCOSE: 92 mg/dL (ref 65–99)
Potassium: 3.8 mmol/L (ref 3.5–5.1)
Sodium: 139 mmol/L (ref 135–145)

## 2016-03-29 LAB — HEPARIN LEVEL (UNFRACTIONATED): HEPARIN UNFRACTIONATED: 0.38 [IU]/mL (ref 0.30–0.70)

## 2016-03-29 LAB — PROTIME-INR
INR: 1.41
Prothrombin Time: 17.4 seconds — ABNORMAL HIGH (ref 11.4–15.2)

## 2016-03-29 MED ORDER — ALBUTEROL SULFATE (2.5 MG/3ML) 0.083% IN NEBU
2.5000 mg | INHALATION_SOLUTION | Freq: Once | RESPIRATORY_TRACT | Status: AC
  Administered 2016-03-29: 2.5 mg via RESPIRATORY_TRACT

## 2016-03-29 MED ORDER — CEFAZOLIN SODIUM-DEXTROSE 2-4 GM/100ML-% IV SOLN: 2.0000 g | INTRAVENOUS | Status: DC

## 2016-03-29 MED ORDER — WARFARIN SODIUM 7.5 MG PO TABS: 7.5000 mg | ORAL_TABLET | Freq: Once | ORAL | Status: DC

## 2016-03-29 NOTE — Progress Notes (Signed)
Patient Name: Marie Malone Date of Encounter: 03/29/2016  Primary Cardiologist: Dr. Talmadge Malone Problem List     Principal Problem:   Transient alteration of awareness Active Problems:   HTN (hypertension)   Mitral valve stenosis, rheumatic   Paroxysmal atrial fibrillation (HCC)   HLD (hyperlipidemia)   Asymptomatic stenosis of right carotid artery s/p right CEA 03/10/16   Constipation   Altered mental status   Tongue deviation   Second degree Mobitz I AV block   Syncope    Patient Profile    65 y/o AAF with h/o mitral stenosis s/p valvuloplasty at [facility] in 2008, normal cath in 2008, atrial fibrillation, CVA now on anticoagulation with Coumadin, carotid artery disease s/p recent right carotid  endarterectomy 03/10/16, per Dr. Trula Malone, admitted for syncope/ orthostatic hypotension.    Subjective   Stable. No recurrent syncope/ near syncope. Getting PFTs done for pre-op w/u.   Inpatient Medications    Scheduled Meds: . atorvastatin  80 mg Oral q1800  . chlorhexidine  15 mL Mouth/Throat BID  . docusate sodium  100 mg Oral BID  . metoprolol succinate  25 mg Oral Daily  . polyethylene glycol  17 g Oral Daily  . sodium chloride flush  3 mL Intravenous Q12H  . warfarin  7.5 mg Oral ONCE-1800  . Warfarin - Pharmacist Dosing Inpatient   Does not apply q1800   Continuous Infusions: . heparin 700 Units/hr (03/29/16 0316)   PRN Meds: sodium chloride, acetaminophen, ondansetron (ZOFRAN) IV, sodium chloride flush   Vital Signs    Vitals:   03/28/16 0914 03/28/16 1240 03/28/16 2126 03/29/16 0540  BP: (!) 111/43 126/66 113/68 (!) 130/41  Pulse: 72 73 76 60  Resp:   18 18  Temp: 97.7 F (36.5 C) 97.7 F (36.5 C) 98.3 F (36.8 C) 98.1 F (36.7 C)  TempSrc: Oral Oral Oral Oral  SpO2: 100% 100% 100% 100%  Weight:      Height:        Intake/Output Summary (Last 24 hours) at 03/29/16 0813 Last data filed at 03/29/16 0400  Gross per 24 hour  Intake            325.11 ml  Output                0 ml  Net           325.11 ml   Filed Weights   03/21/16 1504 03/24/16 1140 03/26/16 0500  Weight: 160 lb 6.4 oz (72.8 kg) 160 lb 6.4 oz (72.8 kg) 158 lb 9.6 oz (71.9 kg)    Physical Exam    GEN: Well nourished, well developed, in no acute distress.  HEENT: Grossly normal.  Neck: Supple, no JVD, carotid bruits, + Rt CEA scar Cardiac: RRR, + MS murmur, rubs, or gallops. No clubbing, cyanosis, edema.  Radials/DP/PT 2+ and equal bilaterally.  Respiratory:  Respirations regular and unlabored, clear to auscultation bilaterally. GI: Soft, nontender, nondistended, BS + x 4. MS: no deformity or atrophy. Skin: warm and dry, no rash. Neuro:  Strength and sensation are intact. Psych: AAOx3.  Normal affect.  Labs    CBC  Recent Labs  03/28/16 0336 03/29/16 0239  WBC 4.0 4.0  HGB 11.3* 10.9*  HCT 36.4 35.4*  MCV 84.8 85.3  PLT 141* 301*   Basic Metabolic Panel  Recent Labs  03/28/16 0336 03/29/16 0239  NA 140 139  K 3.8 3.8  CL 109 110  CO2 20* 21*  GLUCOSE 93 92  BUN 13 13  CREATININE 0.82 0.83  CALCIUM 9.6 9.4   Liver Function Tests  Recent Labs  03/27/16 0258  AST 22  ALT 19  ALKPHOS 65  BILITOT 1.0  PROT 7.8  ALBUMIN 3.5   No results for input(s): LIPASE, AMYLASE in the last 72 hours. Cardiac Enzymes No results for input(s): CKTOTAL, CKMB, CKMBINDEX, TROPONINI in the last 72 hours. BNP Invalid input(s): POCBNP D-Dimer No results for input(s): DDIMER in the last 72 hours. Hemoglobin A1C  Recent Labs  03/27/16 0258  HGBA1C 5.9*   Fasting Lipid Panel No results for input(s): CHOL, HDL, LDLCALC, TRIG, CHOLHDL, LDLDIRECT in the last 72 hours. Thyroid Function Tests No results for input(s): TSH, T4TOTAL, T3FREE, THYROIDAB in the last 72 hours.  Invalid input(s): FREET3  Telemetry    SR 77 bpm- Personally Reviewed   Radiology    No results found.    Patient Profile   65 y/o AAF with h/o mitral stenosis  s/p valvuloplasty at [facility] in 2008, normal cath in 2008, atrial fibrillation, CVA now on anticoagulation with Coumadin, carotid artery disease s/p recent right carotid  endarterectomy 03/10/16, per Dr. Trula Malone, admitted for syncope/ orthostatic hypotension.    Assessment & Plan    1. Syncope: no recurrence since admission. Probably orthostatic, but some concern for AFib/MS related syncope. AF documented in past, none since this admission. Has second degree AV block, Mobitz type 1 on beta blocker in current dose, but without bradycardia or lengthy pauses.  2. Moderate to severe MS: PAP appears disproportionately elevated for PAWP and she may have a component of fixed pulmonary arteriolar disease. Nevertheless, she would benefit from relief of MS. Valve appears excessively thickened and calcified for another attempt at commissurotomy/repair. Initially the patient wanted  a little more time to recover from CEA surgery, although the option for surgery during the current admission was offered to her.  She has now changed her mind and wants the surgery sooner.  Dr. Roxy Malone tried to get in touch with Dr. Lawana Malone but thinks he may be out of town.  Plan discussed with Dr. Roxy Malone.  The patient will not be able to get her MV surgery this week and has to have teeth evaluated and pulled prior to surgery anyway. I spoke with Dr. Lawana Malone earlier today. Patient is scheduled for tooth extractions tomorrow morning at 0730 03/30/16. Per Dr. Lawana Malone, hold Coumadin. Can continue IV heparin but this will need to be discontinued 6 hrs prior to surgery at 0130 11/21. Can resume IV heparin 12 hrs post surgery w/o bolus.    3. PAFib: High embolic risk due to prior CVA and rheumatic MS. On IV heparin. Coumadin restarted. INR is 1.41.  4. Sinus tachycardia - resolved: Will maintain current low dosebeta blocker given MS.   5. Recent R CEA: surgery was 03/10/16 by Dr. Trula Malone.   Signed, Marie Jester, PA-C  03/29/2016, 8:13  AM   Agree with note written by Marie Malone  PAC  Pt with severe MS s/p remote MV valvuloplasyt at [facility]. Recent RCEA by Dr Marie Malone. Admitted with syncope. Severe MS by 2D. PAF currently in AF with CVR. Needs MVR. Teeth extraction tomorrow morning by Dr Enrique Sack. Coumadin on hold. On IV hep peri procedure. High embolic risk given prior CVA and MS. Will need to coordinate with Dr Marie Malone re timing of MVR and whether to keep in hosp or send home.  Quay Burow 03/29/2016 10:30 AM

## 2016-03-29 NOTE — Progress Notes (Addendum)
ANTICOAGULATION CONSULT NOTE - Follow Up Consult  Pharmacy Consult:  Heparin / Coumadin Indication: atrial fibrillation  Allergies  Allergen Reactions  . No Known Allergies     Patient Measurements: Height: 5\' 2"  (157.5 cm) Weight: 158 lb 9.6 oz (71.9 kg) IBW/kg (Calculated) : 50.1 Heparin Dosing Weight: 66 kg  Vital Signs: Temp: 98.1 F (36.7 C) (11/20 0540) Temp Source: Oral (11/20 0540) BP: 130/41 (11/20 0540) Pulse Rate: 60 (11/20 0540)  Labs:  Recent Labs  03/27/16 0258  03/27/16 2241 03/28/16 0336 03/29/16 0239  HGB 11.6*  --   --  11.3* 10.9*  HCT 37.6  --   --  36.4 35.4*  PLT 164  --   --  141* 139*  LABPROT 15.7*  --   --  17.2* 17.4*  INR 1.24  --   --  1.40 1.41  HEPARINUNFRC 0.27*  < > 0.52 0.45 0.38  CREATININE 0.85  --   --  0.82 0.83  < > = values in this interval not displayed.  Estimated Creatinine Clearance: 63.6 mL/min (by C-G formula based on SCr of 0.83 mg/dL).     Assessment: 40 YOF s/p cath on 03/25/16 to continue on IV heparin and Coumadin for history of Afib.  Heparin level is therapeutic and INR is below goal as expected.  No bleeding reported.   Goal of Therapy:  INR 2-3 Heparin level 0.3-0.7 units/ml Monitor platelets by anticoagulation protocol: Yes    Plan:  - Coumadin 7.5mg  PO today.  Consider holding if to proceed with tooth extraction/MVrepair soon. - Continue heparin gtt at 700 units/hr - Daily heparin level, CBC, INR   Korynn Kenedy D. Mina Marble, PharmD, BCPS Pager:  [phone] 03/29/2016, 7:48 AM    =======================   Addendum: Per Enrique Sack, DDS note and message through EPIC:  Lenn Cal, DDS  Tyrone Apple, [facility]        Please make sure the heparin therapy is discontinued at 1:30 tomorrow morning to allow for oral surgery at 7:30 AM.  Heparin therapy can be restarted at 10 PM tomorrow evening is no bolus barring any postoperative bleeding complications.  Dr. Jorja Loa      Plan: - f/u post  procedure/surgery tomorrow and resume heparin at 2200 if appropriate   Madysyn Hanken D. Mina Marble, PharmD, BCPS Pager:  [phone] 03/29/2016, 1:56 PM

## 2016-03-29 NOTE — Discharge Instructions (Addendum)
MOUTH CARE AFTER SURGERY ° °FACTS: °· Ice used in ice bag helps keep the swelling down, and can help lessen the pain. °· It is easier to treat pain BEFORE it happens. °· Spitting disturbs the clot and may cause bleeding to start again, or to get worse. °· Smoking delays healing and can cause complications. °· Sharing prescriptions can be dangerous.  Do not take medications not recently prescribed for you. °· Antibiotics may stop birth control pills from working.  Use other means of birth control while on antibiotics. °· Warm salt water rinses after the first 24 hours will help lessen the swelling:  Use 1/2 teaspoonful of table salt per oz.of water. ° °DO NOT: °· Do not spit.  Do not drink through a straw. °· Strongly advised not to smoke, dip snuff or chew tobacco at least for 3 days. °· Do not eat sharp or crunchy foods.  Avoid the area of surgery when chewing. °· Do not stop your antibiotics before your instructions say to do so. °· Do not eat hot foods until bleeding has stopped.  If you need to, let your food cool down to room temperature. ° °EXPECT: °· Some swelling, especially first 2-3 days. °· Soreness or discomfort in varying degrees.  Follow your dentist's instructions about how to handle pain before it starts. °· Pinkish saliva or light blood in saliva, or on your pillow in the morning.  This can last around 24 hours. °· Bruising inside or outside the mouth.  This may not show up until 2-3 days after surgery.  Don't worry, it will go away in time. °· Pieces of "bone" may work themselves loose.  It's OK.  If they bother you, let us know. ° °WHAT TO DO IMMEDIATELY AFTER SURGERY: °· Bite on the gauze with steady pressure for 1-2 hours.  Don't chew on the gauze. °· Do not lie down flat.  Raise your head support especially for the first 24 hours. °· Apply ice to your face on the side of the surgery.  You may apply it 20 minutes on and a few minutes off.  Ice for 8-12 hours.  You may use ice up to 24  hours. °· Before the numbness wears off, take a pain pill as instructed. °· Prescription pain medication is not always required. ° °SWELLING: °· Expect swelling for the first couple of days.  It should get better after that. °· If swelling increases 3 days or so after surgery; let us know as soon as possible. ° °FEVER: °· Take Tylenol every 4 hours if needed to lower your temperature, especially if it is at 100F or higher. °· Drink lots of fluids. °· If the fever does not go away, let us know. ° °BREATHING TROUBLE: °· Any unusual difficulty breathing means you have to have someone bring you to the emergency room ASAP ° °BLEEDING: °· Light oozing is expected for 24 hours or so. °· Prop head up with pillows °· Avoid spitting °· Do not confuse bright red fresh flowing blood with lots of saliva colored with a little bit of blood. °· If you notice some bleeding, place gauze or a tea bag where it is bleeding and apply CONSTANT pressure by biting down for 1 hour.  Avoid talking during this time.  Do not remove the gauze or tea bag during this hour to "check" the bleeding. °· If you notice bright RED bleeding FLOWING out of particular area, and filling the floor of your mouth, put   a wad of gauze on that area, bite down firmly and constantly.  Call us immediately.  If we're closed, have someone bring you to the emergency room.  ORAL HYGIENE:  Brush your teeth as usual after meals and before bedtime.  Use a soft toothbrush around the area of surgery.  DO NOT AVOID BRUSHING.  Otherwise bacteria(germs) will grow and may delay healing or encourage infection.  Since you cannot spit, just gently rinse and let the water flow out of your mouth.  DO NOT SWISH HARD.  EATING:  Cool liquids are a good point to start.  Increase to soft foods as tolerated.  PRESCRIPTIONS:  Follow the directions for your prescriptions exactly as written.  If Dr. Enrique Sack gave you a narcotic pain medication, do not drive, operate  machinery or drink alcohol when on that medication.  QUESTIONS:  Call our office during office hours [phone] or call the Emergency Room at [phone].      Information on my medicine - Coumadin   (Warfarin)  This medication education was reviewed with me or my healthcare representative as part of my discharge preparation.  The pharmacist that spoke with me during my hospital stay was:  Saundra Shelling, [facility]  Why was Coumadin prescribed for you? Coumadin was prescribed for you because you have a blood clot or a medical condition that can cause an increased risk of forming blood clots. Blood clots can cause serious health problems by blocking the flow of blood to the heart, lung, or brain. Coumadin can prevent harmful blood clots from forming. As a reminder your indication for Coumadin is:   Stroke Prevention Because Of Atrial Fibrillation  What test will check on my response to Coumadin? While on Coumadin (warfarin) you will need to have an INR test regularly to ensure that your dose is keeping you in the desired range. The INR (international normalized ratio) number is calculated from the result of the laboratory test called prothrombin time (PT).  If an INR APPOINTMENT HAS NOT ALREADY BEEN MADE FOR YOU please schedule an appointment to have this lab work done by your health care provider within 7 days. Your INR goal is usually a number between:  2 to 3 or your provider may give you a more narrow range like 2-2.5.  Ask your health care provider during an office visit what your goal INR is.  What  do you need to  know  About  COUMADIN? Take Coumadin (warfarin) exactly as prescribed by your healthcare provider about the same time each day.  DO NOT stop taking without talking to the doctor who prescribed the medication.  Stopping without other blood clot prevention medication to take the place of Coumadin may increase your risk of developing a new clot or stroke.  Get refills before you  run out.  What do you do if you miss a dose? If you miss a dose, take it as soon as you remember on the same day then continue your regularly scheduled regimen the next day.  Do not take two doses of Coumadin at the same time.  Important Safety Information A possible side effect of Coumadin (Warfarin) is an increased risk of bleeding. You should call your healthcare provider right away if you experience any of the following: ? Bleeding from an injury or your nose that does not stop. ? Unusual colored urine (red or dark brown) or unusual colored stools (red or black). ? Unusual bruising for unknown reasons. ? A serious fall  or if you hit your head (even if there is no bleeding).  Some foods or medicines interact with Coumadin (warfarin) and might alter your response to warfarin. To help avoid this: ? Eat a balanced diet, maintaining a consistent amount of Vitamin K. ? Notify your provider about major diet changes you plan to make. ? Avoid alcohol or limit your intake to 1 drink for women and 2 drinks for men per day. (1 drink is 5 oz. wine, 12 oz. beer, or 1.5 oz. liquor.)  Make sure that ANY health care provider who prescribes medication for you knows that you are taking Coumadin (warfarin).  Also make sure the healthcare provider who is monitoring your Coumadin knows when you have started a new medication including herbals and non-prescription products.  Coumadin (Warfarin)  Major Drug Interactions  Increased Warfarin Effect Decreased Warfarin Effect  Alcohol (large quantities) Antibiotics (esp. Septra/Bactrim, Flagyl, Cipro) Amiodarone (Cordarone) Aspirin (ASA) Cimetidine (Tagamet) Megestrol (Megace) NSAIDs (ibuprofen, naproxen, etc.) Piroxicam (Feldene) Propafenone (Rythmol SR) Propranolol (Inderal) Isoniazid (INH) Posaconazole (Noxafil) Barbiturates (Phenobarbital) Carbamazepine (Tegretol) Chlordiazepoxide (Librium) Cholestyramine (Questran) Griseofulvin Oral  Contraceptives Rifampin Sucralfate (Carafate) Vitamin K   Coumadin (Warfarin) Major Herbal Interactions  Increased Warfarin Effect Decreased Warfarin Effect  Garlic Ginseng Ginkgo biloba Coenzyme Q10 Green tea St. Johns wort    Coumadin (Warfarin) FOOD Interactions  Eat a consistent number of servings per week of foods HIGH in Vitamin K (1 serving =  cup)  Collards (cooked, or boiled & drained) Kale (cooked, or boiled & drained) Mustard greens (cooked, or boiled & drained) Parsley *serving size only =  cup Spinach (cooked, or boiled & drained) Swiss chard (cooked, or boiled & drained) Turnip greens (cooked, or boiled & drained)  Eat a consistent number of servings per week of foods MEDIUM-HIGH in Vitamin K (1 serving = 1 cup)  Asparagus (cooked, or boiled & drained) Broccoli (cooked, boiled & drained, or raw & chopped) Brussel sprouts (cooked, or boiled & drained) *serving size only =  cup Lettuce, raw (green leaf, endive, romaine) Spinach, raw Turnip greens, raw & chopped   These websites have more information on Coumadin (warfarin):  FailFactory.se; VeganReport.com.au;

## 2016-03-29 NOTE — Consult Note (Signed)
DENTAL CONSULTATION  Date of Consultation:  03/29/2016 Patient Name:   Marie Malone Date of Birth:   02-06-1951 Medical Record Number: [id number]  VITALS: BP (!) 130/41 (BP Location: Left Arm) Comment: nurse notified  Pulse 60   Temp 98.1 F (36.7 C) (Oral)   Resp 18   Ht 5' 2"  (1.575 m)   Wt 158 lb 9.6 oz (71.9 kg)   SpO2 100%   BMI 29.01 kg/m   CHIEF COMPLAINT: Patient referred by Dr. Roxy Manns for a dental consultation.  HPI: Marie Malone is a 65 year old female with severe mitral stenosis. Patient with anticipated mitral valve replacement in the near future with Dr. Roxy Manns.  Patient is now seen as part of a medically necessary pre-heart valve surgery dental protocol examination to rule out dental infection that may affect the patient's systemic health and anticipated heart valve surgery.  Patient has a history of toothache symptoms but none recently. The patient knows that "all my teeth are bad" and "they all need to come out". The patient has not seen a dentist for "a long time". This is at least 10 years. The patient saw [facility] and had several teeth pulled and then had a maxillary partial denture fabricated. Patient no longer wears the partial denture. The patient indicates that her upper teeth have always been behind her lower teeth consistent with a class III malocclusion.  Patient denies having any dental phobia.  PROBLEM LIST: Patient Active Problem List   Diagnosis Date Noted  . Second degree Mobitz I AV block 03/25/2016  . Syncope   . Tongue deviation   . Altered mental status 03/23/2016  . Transient alteration of awareness 03/21/2016  . Constipation 03/19/2016  . Asymptomatic stenosis of right carotid artery s/p right CEA 03/10/16 03/10/2016  . Carotid stenosis 04/09/2014  . Cerebral infarction due to embolism of left middle cerebral artery ([place]) 04/09/2014  . HLD (hyperlipidemia) 04/09/2014  . Valvular vegetation 04/09/2014  . Urinary urgency 12/03/2013   . Encounter for therapeutic drug monitoring 06/04/2013  . Paroxysmal atrial fibrillation ([place]) 04/19/2013  . Cardiac device in situ 04/19/2013  . Endocarditis 04/13/2013  . History of CVA (cerebrovascular accident) 04/12/2013  . S/P aortic valvotomy 2008 04/06/2013  . Mild pulmonary hypertension 04/06/2013  . Mitral valve stenosis, rheumatic 10/25/2012  . Routine general medical examination at a health care facility 09/22/2012  . HTN (hypertension) 08/30/2012  . History of rheumatic fever 08/30/2012    PMH: Past Medical History:  Diagnosis Date  . Atrial fibrillation ([place])   . Complication of anesthesia    difficult to wake up from anesthesia  . Heart murmur   . History of rheumatic fever    as a child  . Hyperlipidemia   . Hypertension   . Mitral stenosis   . OSA (obstructive sleep apnea)    mild per sleep study 2008  . Paroxysmal atrial fibrillation ([facility])   . Pneumonia    double pneumonia once  . Stroke [facility])    x2    PSH: Past Surgical History:  Procedure Laterality Date  . APPENDECTOMY  1989   appendix ruptured,had peritonitis  . BALLOON VALVULOPLASTY  2008   [facility]  . CARDIAC CATHETERIZATION N/A 03/25/2016   Procedure: Right/Left Heart Cath and Coronary Angiography;  Surgeon: Jettie Booze, MD;  Location: [place] CV LAB;  Service: Cardiovascular;  Laterality: N/A;  . COLONOSCOPY W/ POLYPECTOMY    . ENDARTERECTOMY Right 03/10/2016   Procedure: ENDARTERECTOMY CAROTID RIGHT;  Surgeon:  Serafina Mitchell, MD;  Location: [place];  Service: Vascular;  Laterality: Right;  . EP IMPLANTABLE DEVICE N/A 03/11/2016   Procedure: Loop Recorder Removal;  Surgeon: Deboraha Sprang, MD;  Location: [place] CV LAB;  Service: Cardiovascular;  Laterality: N/A;  . 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 LOOP RECORDER IMPLANT N/A 04/10/2013   Procedure: LOOP RECORDER IMPLANT;  Surgeon: Coralyn Mark, MD;  Location: [place] CATH  LAB;  Service: Cardiovascular;  Laterality: N/A;  . PATCH ANGIOPLASTY Right 03/10/2016   Procedure: PATCH ANGIOPLASTY CAROTID RIGHT USING Rueben Bash BIOLOGIC PATCH;  Surgeon: Serafina Mitchell, MD;  Location: [place];  Service: Vascular;  Laterality: Right;  . TEE WITHOUT CARDIOVERSION N/A 04/10/2013   Procedure: TRANSESOPHAGEAL ECHOCARDIOGRAM (TEE);  Surgeon: [name], MD;  Location: [facility] ENDOSCOPY;  Service: Cardiovascular;  Laterality: N/A;  . TEE WITHOUT CARDIOVERSION N/A 03/24/2016   Procedure: TRANSESOPHAGEAL ECHOCARDIOGRAM (TEE);  Surgeon: Jerline Pain, MD;  Location: [facility] ENDOSCOPY;  Service: Cardiovascular;  Laterality: N/A;  . TUBAL LIGATION      ALLERGIES: Allergies  Allergen Reactions  . No Known Allergies     MEDICATIONS: Current Facility-Administered Medications  Medication Dose Route Frequency Provider Last Rate Last Dose  . 0.9 %  sodium chloride infusion  250 mL Intravenous PRN Jettie Booze, MD      . acetaminophen (TYLENOL) tablet 650 mg  650 mg Oral Q4H PRN Jettie Booze, MD      . atorvastatin (LIPITOR) tablet 80 mg  80 mg Oral q1800 Shon Millet, DO   80 mg at 03/28/16 1737  . chlorhexidine (PERIDEX) 0.12 % solution 15 mL  15 mL Mouth/Throat BID Rexene Alberts, MD   15 mL at 03/28/16 2202  . docusate sodium (COLACE) capsule 100 mg  100 mg Oral BID Shon Millet, DO   100 mg at 03/28/16 0912  . heparin ADULT infusion 100 units/mL (25000 units/217m sodium chloride 0.45%)  700 Units/hr Intravenous Continuous BConsuelo Pandy PA-C 7 mL/hr at 03/29/16 0316 700 Units/hr at 03/29/16 0316  . metoprolol succinate (TOPROL-XL) 24 hr tablet 25 mg  25 mg Oral Daily Mihai Croitoru, MD   25 mg at 03/28/16 0912  . ondansetron (ZOFRAN) injection 4 mg  4 mg Intravenous Q6H PRN JJettie Booze MD      . polyethylene glycol (MIRALAX / GLYCOLAX) packet 17 g  17 g Oral Daily JRich FuchsChoi, DO   17 g at 03/26/16 1034  . sodium chloride  flush (NS) 0.9 % injection 3 mL  3 mL Intravenous Q12H JJettie Booze MD   3 mL at 03/26/16 2106  . sodium chloride flush (NS) 0.9 % injection 3 mL  3 mL Intravenous PRN JJettie Booze MD        LABS: Lab Results  Component Value Date   WBC 4.0 03/29/2016   HGB 10.9 (L) 03/29/2016   HCT 35.4 (L) 03/29/2016   MCV 85.3 03/29/2016   PLT 139 (L) 03/29/2016      Component Value Date/Time   NA 139 03/29/2016 0239   K 3.8 03/29/2016 0239   CL 110 03/29/2016 0239   CO2 21 (L) 03/29/2016 0239   GLUCOSE 92 03/29/2016 0239   BUN 13 03/29/2016 0239   CREATININE 0.83 03/29/2016 0239   CREATININE 0.98 01/30/2016 1410   CALCIUM 9.4 03/29/2016 0239   GFRNONAA >60 03/29/2016 0239  GFRNONAA 64 12/17/2013 1607   GFRAA >60 03/29/2016 0239   GFRAA 74 12/17/2013 1607   Lab Results  Component Value Date   INR 1.41 03/29/2016   INR 1.40 03/28/2016   INR 1.24 03/27/2016   No results found for: PTT  SOCIAL HISTORY: Social History   Social History  . Marital status: Married    Spouse name: N/A  . Number of children: 2  . Years of education: Associates   Occupational History  . Not on file.   Social History Main Topics  . Smoking status: Former Smoker    Packs/day: 1.00    Years: 5.00    Types: Cigarettes    Start date: 07/15/1970    Quit date: 05/11/1975  . Smokeless tobacco: Never Used     Comment: quit smoking 36 years ago  . Alcohol use No     Comment: drank years ago when young  . Drug use: No  . Sexual activity: Not on file   Other Topics Concern  . Not on file   Social History Narrative   Retired Archivist   Married   She has 2 grown children-   Daughter lives in [place]   Son lives in [place]   6 grandchildren        FAMILY HISTORY: Family History  Problem Relation Age of Onset  . Diabetes Mother   . Stroke Mother   . Diabetes Father   . Heart disease Father     CHF  . Cancer Father     kidney  . Diabetes Sister   . Diabetes  Brother   . Hypertension Daughter     REVIEW OF SYSTEMS: Reviewed with the patient as per history of present illness. Psych: The patient denies having dental phobia.  Musculoskeletal: Patient denies ever having had multiple myeloma. Patient denies ever having had any Fosamax, Zometa, Aredia, or Xgeva or other cancer medications.   DENTAL HISTORY: CHIEF COMPLAINT: Patient referred by Dr. Roxy Manns for a dental consultation.  HPI: RYLAH FUKUDA is a 65 year old female with severe mitral stenosis. Patient with anticipated mitral valve replacement in the near future with Dr. Roxy Manns.  Patient is now seen as part of a medically necessary pre-heart valve surgery dental protocol examination to rule out dental infection that may affect the patient's systemic health and anticipated heart valve surgery.  Patient has a history of toothache symptoms but none recently. The patient knows that "all my teeth are bad" and "they all need to come out". The patient has not seen a dentist for "a long time". This is at least 10 years. The patient saw [facility] and had several teeth pulled and then had a maxillary partial denture fabricated. Patient no longer wears the partial denture. The patient indicates that her upper teeth have always been behind her lower teeth consistent with a class III malocclusion.  Patient denies having any dental phobia.   DENTAL EXAMINATION: GENERAL:Patient is a well-developed, well-nourished female in no acute distress.  HEAD AND NECK:There is no palpable neck lymphadenopathy. The patient denies acute TMJ symptoms.  INTRAORAL EXAM:Patient has normal saliva. There is no evidence of oral abscess formation.  DENTITION: The patient has multiple missing teeth. There is a retained root segment in the upper right quadrant.  PERIODONTAL: The patient has chronic periodontitis with plaque and calculus accumulations, generalized gingival recession, and tooth mobility. There is moderate to  severe bone loss noted.  DENTAL CARIES/SUBOPTIMAL RESTORATIONS:Multiple dental caries are noted.  ENDODONTIC:Patient  with a history of acute pulpitis symptoms although none today. Patient appears to have multiple areas of periapical pathology and radiolucency.  CROWN AND BRIDGE:There are no crown or bridge restorations  PROSTHODONTIC: Patient  has a history of a maxillary partial denture but no longer wears the partial denture.   OCCLUSION: The patient has a poor occlusal scheme secondary to multiple missing teeth, supra-eruption and drifting of the unopposed teeth into the edentulous areas, and lack of replacement of missing teeth with dental prostheses.  RADIOGRAPHIC INTERPRETATION: An orthopantogram was taken on 03/26/2016. This is suboptimal. There are multiple missing teeth. There is supra-eruption and drifting of the unopposed teeth into the edentulous areas. There are multiple areas of periapical pathology and radiolucency. Radiographic calculus is noted. There is moderate to severe bone loss noted.  ASSESSMENTS: 1. Severe mitral stenosis 2. Pre-heart valve surgery dental protocol 3. Chronic apical periodontitis 4. Dental caries 5. Chronic periodontitis of bone loss 6. Accretions 7. Gingival recession 8. Tooth mobility 9. Multiple missing teeth 10. Retained root segment  11. Supra-eruption and drifting of the unopposed teeth into the edentulous areas 12. Poor occlusal scheme and  class III malocclusion 12. Anticoagulation with heparin therapy with risk for bleeding with invasive dental procedures 13. History of endocarditis with the need for antibiotic premedication prior to invasive dental procedures 14. Risk for complications up to and including death due to overall cardiovascular and respiratory compromise  PLAN/RECOMMENDATIONS: 1. I discussed the risks, benefits, and complications of various treatment options with the patient in relationship to her medical and dental  conditions, anticipated mitral valve heart surgery, and the risk for future endocarditis. We discussed various treatment options to include no treatment, multiple extractions with alveoloplasty, pre-prosthetic surgery as indicated, periodontal therapy, dental restorations, root canal therapy, crown and bridge therapy, implant therapy, and replacement of missing teeth as indicated. The patient currently wishes to proceed with extraction of remaining teeth with alveoloplasty and pre-prosthetic surgery as needed in the operating room with general anesthesia.  This has been scheduled for tomorrow at 7:30 AM at [facility] operating room.  After the appropriate amount of time, Dr. Roxy Manns will proceed with mitral valve surgery as indicated.  Patient will need to follow up with the dentist of her choice for fabrication of upper and lower complete dentures after adequate healing and once medically stable from the anticipated heart valve surgery. Heparin therapy will be discontinued at 1:30 AM tomorrow morning to allow for oral surgery at 7:30 AM. Heparin therapy will be held until 10 PM tomorrow night and restarted with no bolus if there is no significant postoperative bleeding.  2. Discussion of findings with medical team and coordination of future medical and dental care as needed.   Lenn Cal, DDS

## 2016-03-29 NOTE — Progress Notes (Signed)
Dictation #2 [id number]  [id number] Patient ID: Marie Malone, female   DOB: [birth year], 65 y.o.   MRN: [id number]    PROGRESS NOTE    Marie Malone  KAJ:[id number] DOB: 1950-09-14 DOA: 03/21/2016  PCP: [name], NP   Brief Narrative:  65 y.o. female with medical history significant for hypertension, dyslipidemia, known mitral stenosis and moderate to severe pulmonary hypertension, history of prior CVA, recent right carotid endarterectomy on 11/1, A2 fibrillation on warfarin, prior aortic valvulotomy in 2008. Patient has been having issues with persistent headache and operative site discomfort since the surgical procedure. Patient took 2 Tylenol PM's prior to going to bed around 11 PM and woke up just before 2 AM and ask for water. When her husband returned patient was sitting up in the bed but "staring off"and would not respond to him.  Assessment & Plan: Near syncope, secondary to orthostatics and ? A-fib/MS related  - no recurrence since admission - Has second degree AV block, Mobitz type 1 on beta blocker but without bradycardia or lengthy pauses - keep on tele for now - ambulate as tolerated, pt says she has been walking to the bathroom with no difficulties   Moderate (to severe?) MS - gradients are high on TEE - pt is now status post right/left cardiac cath - conclusion below  The left ventricular systolic function is normal.  The left ventricular ejection fraction is 55-65% by visual estimate.  There is no aortic valve stenosis.  There is moderate mitral valve stenosis. Mean gradient 10 mm Hg. MV area 1.3 cm2  Hemodynamic findings consistent with moderate pulmonary hypertension and mitral valve stenosis. - continue to follow up on cardiology team recommendations  - surgical eval to determine need for Mitral valve repair, TCTS consulted, pt will need dental extraction, may not be able to get MVR done until dental issues resolved first and I see no dental  coverage for today   PAFib - coumadin was resumed yesterday  - INR 1.4  Sinus tachycardia - resolved, HR at target range, in 80's this AM - continue Metoprolol 25 mg PO QD  Recent R CEA - Poor postop PO intake and maybe neurovegetative impact of carotid sinus manipulation may have contributed to recent labile BP - BP currently on low end of normal   DVT prophylaxis: Heparin --> transition to Coumadin when cardiology OK's Code Status: Full Family Communication: Patient and husband at bedside  Disposition Plan: depends on cardiology and TCTS teams  Consultants:   Cardiology   TCTS  Procedures:   Cardiac cath 11/16 -->  Antimicrobials:   None  Subjective: No events overnight.   Objective: Vitals:   03/28/16 0914 03/28/16 1240 03/28/16 2126 03/29/16 0540  BP: (!) 111/43 126/66 113/68 (!) 130/41  Pulse: 72 73 76 60  Resp:   18 18  Temp: 97.7 F (36.5 C) 97.7 F (36.5 C) 98.3 F (36.8 C) 98.1 F (36.7 C)  TempSrc: Oral Oral Oral Oral  SpO2: 100% 100% 100% 100%  Weight:      Height:        Intake/Output Summary (Last 24 hours) at 03/29/16 0913 Last data filed at 03/29/16 0400  Gross per 24 hour  Intake           175.11 ml  Output                0 ml  Net           175.11 ml   Danley Danker  Weights   03/21/16 1504 03/24/16 1140 03/26/16 0500  Weight: 72.8 kg (160 lb 6.4 oz) 72.8 kg (160 lb 6.4 oz) 71.9 kg (158 lb 9.6 oz)    Examination:  General exam: Appears calm and comfortable  Respiratory system: Clear to auscultation. Respiratory effort normal. Cardiovascular system: RRR. No pedal edema. Gastrointestinal system: Abdomen is nondistended, soft and nontender. No organomegaly or masses felt. Normal bowel sounds heard. Central nervous system: Alert and oriented. No focal neurological deficits. Extremities: Symmetric 5 x 5 power.  Data Reviewed: I have personally reviewed following labs and imaging studies  CBC:  Recent Labs Lab 03/25/16 0621  03/26/16 0715 03/27/16 0258 03/28/16 0336 03/29/16 0239  WBC 5.0 4.8 4.2 4.0 4.0  HGB 11.8* 10.9* 11.6* 11.3* 10.9*  HCT 37.5 35.5* 37.6 36.4 35.4*  MCV 84.7 85.5 84.9 84.8 85.3  PLT 171 184 164 141* 161*   Basic Metabolic Panel:  Recent Labs Lab 03/27/16 0258 03/28/16 0336 03/29/16 0239  NA 138 140 139  K 4.0 3.8 3.8  CL 107 109 110  CO2 21* 20* 21*  GLUCOSE 102* 93 92  BUN 14 13 13   CREATININE 0.85 0.82 0.83  CALCIUM 9.4 9.6 9.4   Coagulation Profile:  Recent Labs Lab 03/25/16 0621 03/26/16 0715 03/27/16 0258 03/28/16 0336 03/29/16 0239  INR 1.67 1.37 1.24 1.40 1.41   BNP (last 3 results)  Recent Labs  10/10/15 1343 01/20/16 1539  PROBNP 238.0* 136.0*   Radiology Studies: No results found.  Scheduled Meds: . atorvastatin  80 mg Oral q1800  . chlorhexidine  15 mL Mouth/Throat BID  . docusate sodium  100 mg Oral BID  . metoprolol succinate  25 mg Oral Daily  . polyethylene glycol  17 g Oral Daily  . sodium chloride flush  3 mL Intravenous Q12H  . warfarin  7.5 mg Oral ONCE-1800  . Warfarin - Pharmacist Dosing Inpatient   Does not apply q1800   Continuous Infusions: . heparin 700 Units/hr (03/29/16 0316)    LOS: 6 days   Time spent: 20 minutes   Faye Ramsay, MD [facility] Pager [phone]  If 7PM-7AM, please contact night-coverage [email or web address] Password [facility] 03/29/2016, 9:13 AM

## 2016-03-29 NOTE — Progress Notes (Signed)
CARDIAC REHAB PHASE I   PRE:  Rate/Rhythm: 70 a fib  BP:  Sitting: 122/48       SaO2: 100 RA  MODE:  Ambulation: 240 ft   POST:  Rate/Rhythm: 78 a fib  BP:  Sitting: 138/66         SaO2: 100 RA  Pt ambulated 240 ft on RA, IV, gait belt, assist x1, fairly steady gait, tolerated well. Pt c/o some DOE, general fatigue, denies any other complaints, declined rest stop. Reinforced the importance of ambulation pre and post-op. Pt appreciative of walk. Pt to recliner after walk, call bell within reach. Will follow.  [place], RN, BSN 03/29/2016 1:52 PM

## 2016-03-30 ENCOUNTER — Inpatient Hospital Stay (HOSPITAL_COMMUNITY): Payer: BC Managed Care – PPO | Admitting: Anesthesiology

## 2016-03-30 ENCOUNTER — Encounter (HOSPITAL_COMMUNITY): Payer: Self-pay | Admitting: Certified Registered Nurse Anesthetist

## 2016-03-30 ENCOUNTER — Encounter (HOSPITAL_COMMUNITY)
Admission: EM | Disposition: A | Discharge status: Home Health | Payer: Self-pay | Source: Home / Self Care | Attending: Internal Medicine

## 2016-03-30 DIAGNOSIS — K083 Retained dental root: Secondary | ICD-10-CM

## 2016-03-30 DIAGNOSIS — K045 Chronic apical periodontitis: Secondary | ICD-10-CM

## 2016-03-30 DIAGNOSIS — K029 Dental caries, unspecified: Secondary | ICD-10-CM

## 2016-03-30 DIAGNOSIS — M278 Other specified diseases of jaws: Secondary | ICD-10-CM

## 2016-03-30 DIAGNOSIS — K0530 Chronic periodontitis, unspecified: Secondary | ICD-10-CM | POA: Diagnosis present

## 2016-03-30 DIAGNOSIS — K0889 Other specified disorders of teeth and supporting structures: Secondary | ICD-10-CM | POA: Diagnosis present

## 2016-03-30 HISTORY — PX: MULTIPLE EXTRACTIONS WITH ALVEOLOPLASTY: SHX5342

## 2016-03-30 LAB — PROTIME-INR
INR: 1.5
PROTHROMBIN TIME: 18.3 s — AB (ref 11.4–15.2)

## 2016-03-30 SURGERY — MULTIPLE EXTRACTION WITH ALVEOLOPLASTY
Anesthesia: General | Site: Mouth

## 2016-03-30 MED ORDER — ONDANSETRON HCL 4 MG/2ML IJ SOLN: 4.0000 mg | Freq: Four times a day (QID) | INTRAMUSCULAR | Status: DC | PRN

## 2016-03-30 MED ORDER — OXYCODONE HCL 5 MG PO TABS: 5.0000 mg | ORAL_TABLET | Freq: Once | ORAL | Status: DC | PRN

## 2016-03-30 MED ORDER — LIDOCAINE-EPINEPHRINE 2 %-1:100000 IJ SOLN
INTRAMUSCULAR | Status: AC
  Filled 2016-03-30: qty 10.2

## 2016-03-30 MED ORDER — SUGAMMADEX SODIUM 200 MG/2ML IV SOLN
INTRAVENOUS | Status: DC | PRN
  Administered 2016-03-30: 200 mg via INTRAVENOUS

## 2016-03-30 MED ORDER — OXYMETAZOLINE HCL 0.05 % NA SOLN
NASAL | Status: DC | PRN
  Administered 2016-03-30: 2 via NASAL

## 2016-03-30 MED ORDER — LACTATED RINGERS IV SOLN: INTRAVENOUS | Status: DC

## 2016-03-30 MED ORDER — KETOROLAC TROMETHAMINE 30 MG/ML IJ SOLN
30.0000 mg | Freq: Four times a day (QID) | INTRAMUSCULAR | Status: DC | PRN
Start: 2016-03-30 — End: 2016-04-02
  Administered 2016-03-30 – 2016-04-02 (×9): 30 mg via INTRAVENOUS
  Filled 2016-03-30 (×10): qty 1

## 2016-03-30 MED ORDER — DEXAMETHASONE SODIUM PHOSPHATE 10 MG/ML IJ SOLN
INTRAMUSCULAR | Status: DC | PRN
  Administered 2016-03-30: 10 mg via INTRAVENOUS

## 2016-03-30 MED ORDER — PHENYLEPHRINE HCL 10 MG/ML IJ SOLN
INTRAMUSCULAR | Status: AC
  Filled 2016-03-30: qty 1

## 2016-03-30 MED ORDER — FENTANYL CITRATE (PF) 250 MCG/5ML IJ SOLN
INTRAMUSCULAR | Status: AC
  Filled 2016-03-30: qty 5

## 2016-03-30 MED ORDER — OXYCODONE-ACETAMINOPHEN 5-325 MG PO TABS
1.0000 | ORAL_TABLET | ORAL | Status: DC | PRN
  Administered 2016-03-30 – 2016-04-03 (×4): 1 via ORAL
  Administered 2016-04-03: 2 via ORAL
  Filled 2016-03-30 (×2): qty 2
  Filled 2016-03-30: qty 1
  Filled 2016-03-30: qty 2
  Filled 2016-03-30 (×3): qty 1

## 2016-03-30 MED ORDER — LIDOCAINE 2% (20 MG/ML) 5 ML SYRINGE
INTRAMUSCULAR | Status: DC | PRN
Start: 2016-03-30 — End: 2016-03-30
  Administered 2016-03-30: 80 mg via INTRAVENOUS

## 2016-03-30 MED ORDER — ONDANSETRON HCL 4 MG/2ML IJ SOLN
INTRAMUSCULAR | Status: DC | PRN
  Administered 2016-03-30: 4 mg via INTRAVENOUS

## 2016-03-30 MED ORDER — PROPOFOL 10 MG/ML IV BOLUS
INTRAVENOUS | Status: AC
  Filled 2016-03-30: qty 20

## 2016-03-30 MED ORDER — ROCURONIUM BROMIDE 10 MG/ML (PF) SYRINGE
PREFILLED_SYRINGE | INTRAVENOUS | Status: DC | PRN
Start: 2016-03-30 — End: 2016-03-30
  Administered 2016-03-30: 40 mg via INTRAVENOUS

## 2016-03-30 MED ORDER — DEXAMETHASONE SODIUM PHOSPHATE 10 MG/ML IJ SOLN
INTRAMUSCULAR | Status: AC
  Filled 2016-03-30: qty 1

## 2016-03-30 MED ORDER — CEFAZOLIN SODIUM 1 G IJ SOLR
INTRAMUSCULAR | Status: DC | PRN
  Administered 2016-03-30: 2 g via INTRAMUSCULAR

## 2016-03-30 MED ORDER — HEPARIN (PORCINE) IN NACL 100-0.45 UNIT/ML-% IJ SOLN
600.0000 [IU]/h | INTRAMUSCULAR | Status: DC
  Administered 2016-03-30: 700 [IU]/h via INTRAVENOUS
  Filled 2016-03-30: qty 250

## 2016-03-30 MED ORDER — OXYMETAZOLINE HCL 0.05 % NA SOLN
NASAL | Status: DC | PRN
  Administered 2016-03-30: 1 via TOPICAL

## 2016-03-30 MED ORDER — ROCURONIUM BROMIDE 10 MG/ML (PF) SYRINGE
PREFILLED_SYRINGE | INTRAVENOUS | Status: AC
  Filled 2016-03-30: qty 10

## 2016-03-30 MED ORDER — BUPIVACAINE-EPINEPHRINE 0.5% -1:200000 IJ SOLN
INTRAMUSCULAR | Status: DC | PRN
  Administered 2016-03-30: 3.6 mL

## 2016-03-30 MED ORDER — AMINOCAPROIC ACID SOLUTION 5% (50 MG/ML)
10.0000 mL | ORAL | Status: AC
  Administered 2016-03-30: 100 mL via ORAL
  Filled 2016-03-30: qty 100

## 2016-03-30 MED ORDER — PHENYLEPHRINE 40 MCG/ML (10ML) SYRINGE FOR IV PUSH (FOR BLOOD PRESSURE SUPPORT)
PREFILLED_SYRINGE | INTRAVENOUS | Status: DC | PRN
  Administered 2016-03-30: 80 ug via INTRAVENOUS

## 2016-03-30 MED ORDER — LIDOCAINE-EPINEPHRINE 2 %-1:100000 IJ SOLN
INTRAMUSCULAR | Status: DC | PRN
Start: 2016-03-30 — End: 2016-03-30
  Administered 2016-03-30: 10.2 mL via INTRADERMAL

## 2016-03-30 MED ORDER — SODIUM CHLORIDE 0.9 % IJ SOLN
INTRAMUSCULAR | Status: AC
Start: 2016-03-30 — End: 2016-03-30
  Filled 2016-03-30: qty 10

## 2016-03-30 MED ORDER — MORPHINE SULFATE (PF) 2 MG/ML IV SOLN
2.0000 mg | INTRAVENOUS | Status: DC | PRN
  Administered 2016-03-31 – 2016-04-02 (×6): 2 mg via INTRAVENOUS
  Filled 2016-03-30 (×6): qty 1

## 2016-03-30 MED ORDER — CEFAZOLIN SODIUM 1 G IJ SOLR
INTRAMUSCULAR | Status: AC
  Filled 2016-03-30: qty 20

## 2016-03-30 MED ORDER — HEMOSTATIC AGENTS (NO CHARGE) OPTIME
TOPICAL | Status: DC | PRN
  Administered 2016-03-30: 1 via TOPICAL

## 2016-03-30 MED ORDER — BUPIVACAINE-EPINEPHRINE (PF) 0.5% -1:200000 IJ SOLN
INTRAMUSCULAR | Status: AC
  Filled 2016-03-30: qty 3.6

## 2016-03-30 MED ORDER — ONDANSETRON HCL 4 MG/2ML IJ SOLN
INTRAMUSCULAR | Status: AC
  Filled 2016-03-30: qty 2

## 2016-03-30 MED ORDER — PROPOFOL 10 MG/ML IV BOLUS
INTRAVENOUS | Status: DC | PRN
Start: 2016-03-30 — End: 2016-03-30
  Administered 2016-03-30: 100 mg via INTRAVENOUS

## 2016-03-30 MED ORDER — FENTANYL CITRATE (PF) 100 MCG/2ML IJ SOLN: 25.0000 ug | INTRAMUSCULAR | Status: DC | PRN

## 2016-03-30 MED ORDER — FENTANYL CITRATE (PF) 100 MCG/2ML IJ SOLN
INTRAMUSCULAR | Status: DC | PRN
  Administered 2016-03-30 (×10): 25 ug via INTRAVENOUS

## 2016-03-30 MED ORDER — ARTIFICIAL TEARS OP OINT: TOPICAL_OINTMENT | OPHTHALMIC | Status: DC | PRN

## 2016-03-30 MED ORDER — MIDAZOLAM HCL 5 MG/5ML IJ SOLN
INTRAMUSCULAR | Status: DC | PRN
  Administered 2016-03-30: 2 mg via INTRAVENOUS

## 2016-03-30 MED ORDER — STERILE WATER FOR IRRIGATION IR SOLN
Status: DC | PRN
  Administered 2016-03-30: 1000 mL

## 2016-03-30 MED ORDER — MIDAZOLAM HCL 2 MG/2ML IJ SOLN
INTRAMUSCULAR | Status: AC
  Filled 2016-03-30: qty 2

## 2016-03-30 MED ORDER — OXYCODONE HCL 5 MG/5ML PO SOLN: 5.0000 mg | Freq: Once | ORAL | Status: DC | PRN

## 2016-03-30 MED ORDER — NEOSTIGMINE METHYLSULFATE 5 MG/5ML IV SOSY
PREFILLED_SYRINGE | INTRAVENOUS | Status: AC
  Filled 2016-03-30: qty 5

## 2016-03-30 MED ORDER — LIDOCAINE 2% (20 MG/ML) 5 ML SYRINGE
INTRAMUSCULAR | Status: AC
Start: 2016-03-30 — End: 2016-03-30
  Filled 2016-03-30: qty 5

## 2016-03-30 MED ORDER — OXYMETAZOLINE HCL 0.05 % NA SOLN
NASAL | Status: AC
  Filled 2016-03-30: qty 15

## 2016-03-30 MED ORDER — PHENYLEPHRINE HCL 10 MG/ML IJ SOLN
INTRAVENOUS | Status: DC | PRN
  Administered 2016-03-30: 20 ug/min via INTRAVENOUS

## 2016-03-30 MED ORDER — 0.9 % SODIUM CHLORIDE (POUR BTL) OPTIME
TOPICAL | Status: DC | PRN
  Administered 2016-03-30: 1000 mL

## 2016-03-30 MED ORDER — LACTATED RINGERS IV SOLN
INTRAVENOUS | Status: DC | PRN
  Administered 2016-03-30 (×2): via INTRAVENOUS

## 2016-03-30 SURGICAL SUPPLY — 36 items
ALCOHOL 70% 16 OZ (MISCELLANEOUS) ×3 IMPLANT
ATTRACTOMAT 16X20 MAGNETIC DRP (DRAPES) ×3 IMPLANT
BLADE SURG 15 STRL LF DISP TIS (BLADE) ×2 IMPLANT
BLADE SURG 15 STRL SS (BLADE) ×4
COVER SURGICAL LIGHT HANDLE (MISCELLANEOUS) ×3 IMPLANT
GAUZE PACKING FOLDED 2  STR (GAUZE/BANDAGES/DRESSINGS) ×2
GAUZE PACKING FOLDED 2 STR (GAUZE/BANDAGES/DRESSINGS) ×1 IMPLANT
GAUZE SPONGE 4X4 16PLY XRAY LF (GAUZE/BANDAGES/DRESSINGS) ×6 IMPLANT
GLOVE BIOGEL PI IND STRL 6 (GLOVE) ×1 IMPLANT
GLOVE BIOGEL PI INDICATOR 6 (GLOVE) ×2
GLOVE SURG ORTHO 8.0 STRL STRW (GLOVE) ×3 IMPLANT
GLOVE SURG SS PI 6.0 STRL IVOR (GLOVE) ×3 IMPLANT
GOWN STRL REUS W/ TWL LRG LVL3 (GOWN DISPOSABLE) ×1 IMPLANT
GOWN STRL REUS W/TWL 2XL LVL3 (GOWN DISPOSABLE) ×3 IMPLANT
GOWN STRL REUS W/TWL LRG LVL3 (GOWN DISPOSABLE) ×2
HEMOSTAT SURGICEL 2X14 (HEMOSTASIS) ×3 IMPLANT
KIT BASIN OR (CUSTOM PROCEDURE TRAY) ×3 IMPLANT
KIT ROOM TURNOVER OR (KITS) ×3 IMPLANT
MANIFOLD NEPTUNE WASTE (CANNULA) ×3 IMPLANT
NEEDLE BLUNT 16X1.5 OR ONLY (NEEDLE) ×6 IMPLANT
NS IRRIG 1000ML POUR BTL (IV SOLUTION) ×3 IMPLANT
PACK EENT II TURBAN DRAPE (CUSTOM PROCEDURE TRAY) ×3 IMPLANT
PAD ARMBOARD 7.5X6 YLW CONV (MISCELLANEOUS) ×3 IMPLANT
SPONGE SURGIFOAM ABS GEL 100 (HEMOSTASIS) IMPLANT
SPONGE SURGIFOAM ABS GEL 12-7 (HEMOSTASIS) IMPLANT
SPONGE SURGIFOAM ABS GEL SZ50 (HEMOSTASIS) IMPLANT
SUCTION FRAZIER HANDLE 10FR (MISCELLANEOUS) ×2
SUCTION TUBE FRAZIER 10FR DISP (MISCELLANEOUS) ×1 IMPLANT
SUT CHROMIC 3 0 PS 2 (SUTURE) ×12 IMPLANT
SUT CHROMIC 4 0 P 3 18 (SUTURE) IMPLANT
SYR 50ML SLIP (SYRINGE) ×3 IMPLANT
TOWEL OR 17X26 10 PK STRL BLUE (TOWEL DISPOSABLE) ×3 IMPLANT
TUBE CONNECTING 12'X1/4 (SUCTIONS) ×1
TUBE CONNECTING 12X1/4 (SUCTIONS) ×2 IMPLANT
WATER TABLETS ICX (MISCELLANEOUS) ×3 IMPLANT
YANKAUER SUCT BULB TIP NO VENT (SUCTIONS) ×3 IMPLANT

## 2016-03-30 NOTE — Anesthesia Preprocedure Evaluation (Signed)
Anesthesia Evaluation  Patient identified by MRN, date of birth, ID band Patient awake    Reviewed: Allergy & Precautions, H&P , NPO status , Patient's Chart, lab work & pertinent test results  Airway Mallampati: II   Neck ROM: full    Dental   Pulmonary sleep apnea , former smoker,    breath sounds clear to auscultation       Cardiovascular hypertension, + Peripheral Vascular Disease  + dysrhythmias Atrial Fibrillation  Rhythm:regular Rate:Normal  H/o mitral stenosis.  Balloon valvuloplasty   Neuro/Psych CVA    GI/Hepatic   Endo/Other    Renal/GU      Musculoskeletal   Abdominal   Peds  Hematology   Anesthesia Other Findings   Reproductive/Obstetrics                             Anesthesia Physical Anesthesia Plan  ASA: III  Anesthesia Plan: General   Post-op Pain Management:    Induction: Intravenous  Airway Management Planned: Oral ETT  Additional Equipment:   Intra-op Plan:   Post-operative Plan: Extubation in OR  Informed Consent: I have reviewed the patients History and Physical, chart, labs and discussed the procedure including the risks, benefits and alternatives for the proposed anesthesia with the patient or authorized representative who has indicated his/her understanding and acceptance.     Plan Discussed with: CRNA, Anesthesiologist and Surgeon  Anesthesia Plan Comments:         Anesthesia Quick Evaluation

## 2016-03-30 NOTE — Anesthesia Procedure Notes (Signed)
Procedure Name: Intubation Date/Time: 03/30/2016 7:39 AM Performed by: Justice Rocher Pre-anesthesia Checklist: Patient identified, Emergency Drugs available, Suction available and Patient being monitored Patient Re-evaluated:Patient Re-evaluated prior to inductionOxygen Delivery Method: Circle system utilized Preoxygenation: Pre-oxygenation with 100% oxygen Intubation Type: IV induction Ventilation: Mask ventilation without difficulty Laryngoscope Size: Mac and 4 Grade View: Grade II Nasal Tubes: Nasal prep performed, Nasal Rae, Right and Magill forceps- large, utilized Number of attempts: 1 Placement Confirmation: ETT inserted through vocal cords under direct vision,  positive ETCO2 and breath sounds checked- equal and bilateral Tube secured with: Tape Dental Injury: Teeth and Oropharynx as per pre-operative assessment  Comments: Prepped nasal area with surgilube prior to intubation

## 2016-03-30 NOTE — Transfer of Care (Signed)
Immediate Anesthesia Transfer of Care Note  Patient: Marie Malone  Procedure(s) Performed: Procedure(s) (LRB): EXTRACTION OF TOOTH #'S 2,5,6,11,15,16,20-30 AND 32 WITH ALVEOLOPLASTY AND BILATERAL MAXILLARY LATERAL EXOSTOSES REDUCTIONS (N/A)  Patient Location: PACU  Anesthesia Type: General  Level of Consciousness: awake, sedated, patient cooperative and responds to stimulation  Airway & Oxygen Therapy: Patient Spontanous Breathing and Patient connected to [place] oxygen with mouth full gauze  Post-op Assessment: Report given to PACU RN, Post -op Vital signs reviewed and stable and Patient moving all extremities  Post vital signs: Reviewed and stable  Complications: No apparent anesthesia complications

## 2016-03-30 NOTE — Progress Notes (Signed)
TCTS BRIEF PROGRESS NOTE   Patient doing well following dental extraction Would resume anticoagulation when okay w/ Dr. Lawana Chambers with plans to restart warfarin Patient will be allowed to recover for at least 2-3 weeks before we would make plans for MVR  Rexene Alberts, MD 03/30/2016 2:01 PM

## 2016-03-30 NOTE — Progress Notes (Signed)
Patient ID: Marie Malone, female   DOB: 01/26/1951, 65 y.o.   MRN: [id number]    PROGRESS NOTE    Marie Malone  PJA:[id number] DOB: 1950/11/13 DOA: 03/21/2016  PCP: [name], NP   Brief Narrative:  65 y.o.femalewith medical history significant for hypertension, dyslipidemia, known mitral stenosis and moderate to severe pulmonary hypertension, history of prior CVA, recent right carotid endarterectomy on 11/1, A2 fibrillation on warfarin, prior aortic valvulotomy in 2008. Patient has been having issues with persistent headache and operative site discomfort since the surgical procedure. Patient took 2 Tylenol PM's prior to going to bed around 11 PM and woke up just before 2 AM and ask for water. When her husband returned patient was sitting up in the bed but "staring off"and would not respond to him.  Assessment & Plan: Near syncope, secondary to orthostatics and ? A-fib/MS related  - no recurrence since admission - Has second degree AV block, Mobitz type 1 on beta blocker but without bradycardia or lengthy pauses - keep on tele for now - ambulate as tolerated, pt says she has been walking to the bathroom with no difficulties   Moderate (to severe?) MS - gradients are high on TEE - pt is now status post right/left cardiac cath - conclusion below  The left ventricular systolic function is normal.  The left ventricular ejection fraction is 55-65% by visual estimate.  There is no aortic valve stenosis.  There is moderate mitral valve stenosis. Mean gradient 10 mm Hg. MV area 1.3 cm2  Hemodynamic findings consistent with moderate pulmonary hypertension and mitral valve stenosis. - continue to follow up on cardiology team recommendations  - surgical eval to determine need for Mitral valve repair, TCTS consulted, pt needed dental exctraction, this was done today by Dr. Enrique Sack, pt now resting and voices no concerns - plan for MVR in several weeks per TCTS  PAFib -  resume Coumadin when Dr. Kathrene Bongo OK's - pharmacy can continue dosing please   Sinus tachycardia - resolved, HR at target range, in 80's this AM - continue Metoprolol 25 mg PO QD  Recent R CEA - Poor postop PO intake and maybe neurovegetative impact of carotid sinus manipulation may have contributed to recent labile BP - BP currently on low end of normal   DVT prophylaxis: Heparin --> transition to Coumadin when cardiology OK's Code Status: Full Family Communication: Patient and husband at bedside  Disposition Plan: once TCTS and Dr. Enrique Sack clears for discharge  Consultants:   Cardiology   TCTS  Dental - Dr. Enrique Sack  Procedures:   Cardiac cath 11/16 -->  Antimicrobials:   None  Subjective: No events overnight.  Objective: Vitals:   03/30/16 1034 03/30/16 1100 03/30/16 1114 03/30/16 1339  BP: (!) 151/62   (!) 159/65  Pulse: 80 82 73 75  Resp: 16 19 17 17   Temp:   97.9 F (36.6 C) 98 F (36.7 C)  TempSrc:    Axillary  SpO2: 100% 100% 100% 100%  Weight:      Height:        Intake/Output Summary (Last 24 hours) at 03/30/16 1553 Last data filed at 03/30/16 1340  Gross per 24 hour  Intake             1400 ml  Output              900 ml  Net              500 ml  Filed Weights   03/21/16 1504 03/24/16 1140 03/26/16 0500  Weight: 72.8 kg (160 lb 6.4 oz) 72.8 kg (160 lb 6.4 oz) 71.9 kg (158 lb 9.6 oz)    Examination:  General exam: Appears calm and comfortable  Respiratory system: Clear to auscultation. Respiratory effort normal. Cardiovascular system: S1 & S2 heard, RRR. No JVD, rubs, gallops or clicks. No pedal edema. Gastrointestinal system: Abdomen is nondistended, soft and nontender. No organomegaly or masses felt. Normal bowel sounds heard. Skin: No rashes, lesions or ulcers Psychiatry: Judgement and insight appear normal. Mood & affect appropriate.    Data Reviewed: I have personally reviewed following labs and imaging  studies  CBC:  Recent Labs Lab 03/25/16 0621 03/26/16 0715 03/27/16 0258 03/28/16 0336 03/29/16 0239  WBC 5.0 4.8 4.2 4.0 4.0  HGB 11.8* 10.9* 11.6* 11.3* 10.9*  HCT 37.5 35.5* 37.6 36.4 35.4*  MCV 84.7 85.5 84.9 84.8 85.3  PLT 171 184 164 141* 248*   Basic Metabolic Panel:  Recent Labs Lab 03/27/16 0258 03/28/16 0336 03/29/16 0239  NA 138 140 139  K 4.0 3.8 3.8  CL 107 109 110  CO2 21* 20* 21*  GLUCOSE 102* 93 92  BUN 14 13 13   CREATININE 0.85 0.82 0.83  CALCIUM 9.4 9.6 9.4   Liver Function Tests:  Recent Labs Lab 03/27/16 0258  AST 22  ALT 19  ALKPHOS 65  BILITOT 1.0  PROT 7.8  ALBUMIN 3.5   Coagulation Profile:  Recent Labs Lab 03/26/16 0715 03/27/16 0258 03/28/16 0336 03/29/16 0239 03/30/16 0352  INR 1.37 1.24 1.40 1.41 1.50   BNP (last 3 results)  Recent Labs  10/10/15 1343 01/20/16 1539  PROBNP 238.0* 136.0*   Urine analysis:    Component Value Date/Time   COLORURINE YELLOW 03/26/2016 2019   APPEARANCEUR CLEAR 03/26/2016 2019   LABSPEC 1.015 03/26/2016 2019   PHURINE 6.5 03/26/2016 2019   GLUCOSEU NEGATIVE 03/26/2016 2019   GLUCOSEU NEGATIVE 07/22/2015 [place] NEGATIVE 03/26/2016 2019   [place] NEGATIVE 03/26/2016 2019   KETONESUR NEGATIVE 03/26/2016 2019   PROTEINUR NEGATIVE 03/26/2016 2019   UROBILINOGEN 0.2 07/22/2015 1444   NITRITE NEGATIVE 03/26/2016 2019   LEUKOCYTESUR NEGATIVE 03/26/2016 2019   Radiology Studies: No results found.  Scheduled Meds: . aminocaproic acid  10 mL Oral Q1H  . atorvastatin  80 mg Oral q1800  . chlorhexidine  15 mL Mouth/Throat BID  . docusate sodium  100 mg Oral BID  . metoprolol succinate  25 mg Oral Daily  . polyethylene glycol  17 g Oral Daily  . sodium chloride flush  3 mL Intravenous Q12H   Continuous Infusions: . heparin    . lactated ringers 10 mL/hr at 03/30/16 1130     LOS: 7 days   Time spent: 20 minutes   Faye Ramsay, MD [facility] Pager  [phone]  If 7PM-7AM, please contact night-coverage [email or web address] Password [facility] 03/30/2016, 3:53 PM

## 2016-03-30 NOTE — Anesthesia Postprocedure Evaluation (Signed)
Anesthesia Post Note  Patient: Marie Malone  Procedure(s) Performed: Procedure(s) (LRB): EXTRACTION OF TOOTH #'S 2,5,6,11,15,16,20-30 AND 32 WITH ALVEOLOPLASTY AND BILATERAL MAXILLARY LATERAL EXOSTOSES REDUCTIONS (N/A)  Patient location during evaluation: PACU Anesthesia Type: General Level of consciousness: awake and sedated Pain management: pain level controlled Vital Signs Assessment: post-procedure vital signs reviewed and stable Respiratory status: spontaneous breathing, nonlabored ventilation, respiratory function stable and patient connected to nasal cannula oxygen Cardiovascular status: blood pressure returned to baseline and stable Postop Assessment: no signs of nausea or vomiting Anesthetic complications: no    Last Vitals:  Vitals:   03/30/16 1100 03/30/16 1114  BP:    Pulse: 82 73  Resp: 19 17  Temp:  36.6 C    Last Pain:  Vitals:   03/30/16 0503  TempSrc: Oral  PainSc:                  Gertrue Willette,JAMES TERRILL

## 2016-03-30 NOTE — Progress Notes (Signed)
ANTICOAGULATION CONSULT NOTE - Follow Up Consult  Pharmacy Consult:  Heparin Indication: atrial fibrillation  Allergies  Allergen Reactions  . No Known Allergies     Patient Measurements: Height: 5\' 2"  (157.5 cm) Weight: 158 lb 9.6 oz (71.9 kg) IBW/kg (Calculated) : 50.1 Heparin Dosing Weight: 66 kg  Vital Signs: Temp: 98 F (36.7 C) (11/21 1339) Temp Source: Axillary (11/21 1339) BP: 159/65 (11/21 1339) Pulse Rate: 75 (11/21 1339)  Labs:  Recent Labs  03/27/16 2241 03/28/16 0336 03/29/16 0239 03/30/16 0352  HGB  --  11.3* 10.9*  --   HCT  --  36.4 35.4*  --   PLT  --  141* 139*  --   LABPROT  --  17.2* 17.4* 18.3*  INR  --  1.40 1.41 1.50  HEPARINUNFRC 0.52 0.45 0.38  --   CREATININE  --  0.82 0.83  --     Estimated Creatinine Clearance: 63.6 mL/min (by C-G formula based on SCr of 0.83 mg/dL).     Assessment: 32 YOF with history of Afib on Coumadin PTA.  Patient was transitioned to IV heparin for cath on 03/25/16.  Heparin and Coumadin were then resumed post cath and then placed on hold for teeth extraction today 03/30/16.  Confirm with Dr. Lawana Chambers, resume heparin tonight if no significant bleeding.   Goal of Therapy:  INR 2-3 Heparin level 0.3-0.7 units/ml Monitor platelets by anticoagulation protocol: Yes    Plan:  - F/U tonight for any significant bleeding, if negative, at 2200 resume heparin gtt at 700 units/hr, no bolus - Check 6 hr heparin level - Daily heparin level, CBC, INR - F/U BP and may need to resume Norvasc   Marie Malone D. Mina Marble, PharmD, BCPS Pager:  [phone] 03/30/2016, 2:23 PM

## 2016-03-30 NOTE — Progress Notes (Signed)
Pt received from PACU. Gauze and ice pack in place. Telemetry re-applied, CCMD notified. VSS. Family at bedside - educated on clear liquid diet and post op care. Call light within reach, will continue to monitor.   Fritz Pickerel, RN

## 2016-03-30 NOTE — Progress Notes (Signed)
PRE-OPERATIVE NOTE:  03/30/2016 Marie Malone [id number]  VITALS: BP (!) 130/59 (BP Location: Right Arm)   Pulse 61   Temp 97.8 F (36.6 C) (Oral)   Resp 18   Ht 5\' 2"  (1.575 m)   Wt 158 lb 9.6 oz (71.9 kg)   SpO2 100%   BMI 29.01 kg/m   Lab Results  Component Value Date   WBC 4.0 03/29/2016   HGB 10.9 (L) 03/29/2016   HCT 35.4 (L) 03/29/2016   MCV 85.3 03/29/2016   PLT 139 (L) 03/29/2016   BMET    Component Value Date/Time   NA 139 03/29/2016 0239   K 3.8 03/29/2016 0239   CL 110 03/29/2016 0239   CO2 21 (L) 03/29/2016 0239   GLUCOSE 92 03/29/2016 0239   BUN 13 03/29/2016 0239   CREATININE 0.83 03/29/2016 0239   CREATININE 0.98 01/30/2016 1410   CALCIUM 9.4 03/29/2016 0239   GFRNONAA >60 03/29/2016 0239   GFRNONAA 64 12/17/2013 1607   GFRAA >60 03/29/2016 0239   GFRAA 74 12/17/2013 1607    Lab Results  Component Value Date   INR 1.50 03/30/2016   INR 1.41 03/29/2016   INR 1.40 03/28/2016   No results found for: PTT   [name] presents for multiple dental extractions with alveoloplasty and pre-prosthetic surgery as needed in the operating room with general anesthesia.    SUBJECTIVE: The patient denies any acute medical or dental changes and agrees to proceed with treatment as planned.  EXAM: No sign of acute dental changes.  ASSESSMENT: Patient is affected by chronic apical periodontitis, dental caries, retained root segments, chronic periodontitis, and loose teeth.  PLAN: Patient agrees to proceed with treatment as planned in the operating room as previously discussed and accepts the risks, benefits, and complications of the proposed treatment. Patient is aware of the risk for bleeding, bruising, swelling, infection, pain, nerve damage, soft tissue damage, sinus involvement, root tip fracture, mandible fracture, and the risks of complications associated with the anesthesia. Patient also is aware of the potential for other complications up to  and including death due to overall cardiovascular compromise.    Lenn Cal, DDS

## 2016-03-30 NOTE — Op Note (Signed)
OPERATIVE REPORT  Patient:            Marie Malone Date of Birth:  [birth year] MRN:                [id number]   DATE OF PROCEDURE:  03/30/2016  PREOPERATIVE DIAGNOSES: 1. Severe mitral stenosis  2. Pre-heart valve surgery dental protocol 3. Chronic apical periodontitis 4. Retained root segment 5. Dental caries 6. Chronic periodontitis 7. Loose teeth  POSTOPERATIVE DIAGNOSES: 1. Severe mitral stenosis  2. Pre-heart valve surgery dental protocol 3. Chronic apical periodontitis 4. Retained root segment 5. Dental caries 6. Chronic periodontitis 7. Loose teeth 8. Bilateral maxillary lateral exostoses  OPERATIONS: 1. Multiple extraction of tooth numbers 2, 5, 6, 11, 15, 16,20-30, and 32 2. 4 Quadrants of alveoloplasty 3. Bilateral maxillary lateral exostoses reductions   SURGEON: Lenn Cal, DDS  ASSISTANT: Camie Patience, (dental assistant)  ANESTHESIA: General anesthesia via nasoendotracheal tube.  MEDICATIONS: 1. Ancef 2 g IV prior to invasive dental procedures. 2. Local anesthesia with a total utilization of 6 carpules each containing 34 mg of lidocaine with 0.017 mg of epinephrine as well as 2 carpules each containing 9 mg of bupivacaine with 0.009 mg of epinephrine.  SPECIMENS: There are 18 teeth that were discarded.  DRAINS: None  CULTURES: None  COMPLICATIONS: None   ESTIMATED BLOOD LOSS: 100 mLs.  INTRAVENOUS FLUIDS: 1100 mLs of Lactated ringers solution.  INDICATIONS: The patient was recently diagnosed with severe mitral stenosis.  A medically necessary dental consultation was then requested to evaluate poor dentition as part of a pre-heart valve surgery dental protocol.  The patient was examined and treatment planned for extraction ofremaining teeth with alveoloplasty and pre-prosthetic surgery as needed.  This treatment plan was formulated to decrease the risks and complications associated with dental infection from affecting the patient's  systemic health and the anticipated heart valve surgery.  OPERATIVE FINDINGS: Patient was examined in operating room number 2.  The teeth were identified for extraction. Patient was also noted to have bilateral maxillary lateral exostoses that were reduced during the surgical procedure. The patient was noted be affected by chronic apical periodontitis, retained root segment, dental caries, chronic periodontitis, loose teeth, and bilateral maxillary lateral exostoses.   DESCRIPTION OF PROCEDURE: Patient was brought to the main operating room number 2. Patient was then placed in the supine position on the operating table. General anesthesia was then induced per the anesthesia team. The patient was then prepped and draped in the usual manner for dental medicine procedure. A timeout was performed. The patient was identified and procedures were verified. A throat pack was placed at this time. The oral cavity was then thoroughly examined with the findings noted above. The patient was then ready for dental medicine procedure as follows:  Local anesthesia was then administered sequentially with a total utilization of 6 carpules each containing 34 mg of lidocaine with 0.017 mg of epinephrine as well as 2 carpules  each containing 9 mg bupivacaine with 0.009 mg of epinephrine.  The Maxillary left and right quadrants first approached. Anesthesia was then delivered utilizing infiltration with lidocaine with epinephrine. A #15 blade incision was then made from the maxillary right tuberosity and extended to the mesial of #8. A second 15 blade incision was made from the maxillary left tuberosity and extended to the mesial of #10. Surgical flaps were then carefully reflected.  The maxillary teeth were then subluxated with a series of straight elevators. Appropriate amounts of buccal and  interseptal bone were then removed utilizing a surgical handpiece and bur and copious amounts of sterile water around tooth numbers 2,  5, 6, and 11 appropriately.  The teeth were then again subluxated with a series of straight elevators. Tooth numbers 2, 5, 6, 11, 15, 16 were then removed with a 150 forceps without further complications.  Alveoloplasty was then performed utilizing a ronguers and bone file. At this point in time the bilateral maxillary lateral exostoses were noted on the palatal aspect. The flap was then further reflected to expose these appropriately. These were then reduced utilizing a rongeurs and bone file as needed. Further alveoloplasty was then again performed utilizing a rongeurs and bone file. The surgical sites were then irrigated with copious amounts sterile saline. The tissues were approximated and trimmed appropriately. A piece of Surgicel was placed in the  extraction sockets appropriately. The maxillary right surgical site was then closed from the maxillary right tuberosity and extended the mesial #8 utilizing 3-0 chromic gut suture in a continuous interrupted suture technique 1. The maxillary left surgical site was then closed from the maxillary left tuberosity and extended the mesial #10 utilizing 3-0 chromic gut suture in a continuous interrupted suture technique 1.   At this point time, the mandibular quadrants were approached. The patient was given bilateral inferior alveolar nerve blocks and long buccal nerve blocks utilizing the bupivacaine with epinephrine. Further infiltration was then achieved utilizing the lidocaine with epinephrine. A 15 blade incision was then made from the distal of number 18 and extended to the distal of #32.  A surgical flap was then carefully reflected.  The lower teeth were then subluxated with a series straight elevators. Appropriate amounts of buccal and interseptal bone were then removed utilizing a surgical handpiece and copious amount of sterile water around tooth numbers 20-22, and 28 - 30, and 32.  The teeth were then again subluxated with a series straight elevators.  Tooth numbers 20, 21, 22, 23, 24, 25, 26, 27, 28, 29 were then removed with a 151 forceps. The 30 was then removed with a 23 forceps without complications. The coronal aspect of tooth #32 was then ready removed with a 17 forceps leaving roots remaining. Further bone was then removed around the roots with a surgical handpiece and bur and copious of sterile water. The retained roots were then elevated out with a cryers elevators without complications. Alveoloplasty was then performed utilizing a rongeur and bone file.       The tissues were approximated and trimmed appropriately. The surgical sites were then irrigated with copious amounts of sterile saline. A piece of Surgicel was placed in each extraction socket appropriately. The mandibular left surgical site was then closed from the distal of  #18 and extended the mesial of #24 utilizing 3-0 chromic gut suture in a continuous interrupted suture technique 1. The mandibular right surgical site was then closed from the distal of #32 and extended the mesial #25 utilizing 3-0 chromic gut suture in a continuous interrupted suture technique 1.  At this point time, the entire mouth was irrigated with copious amounts of sterile saline. The patient was examined for complications, seeing none, the dental medicine procedure was deemed to be complete. The throat pack was removed at this time. An oral airway was then placed at the request of the anesthesia team. A series of 4 x 4 gauze moistened with Amicar 5% oral rinse were placed in the mouth to aid hemostasis. The patient was then handed over  to the anesthesia team for final disposition. After an appropriate amount of time, the patient was extubated and taken to the postanesthsia care unit in good condition. All counts were correct for the dental medicine procedure. Patient will continue the Amicar 5% oral rinse post operatively. Patient is to rinse with 10 mls every hour for the next 10 hours and then as needed for  persistent oral oozing. Patient is to use in a swish and spit manner. The patient is to have his heparin therapy restarted by pharmacy at approximately 10 PM this evening with no bolus if no significant oral bleeding exists. The patient is to proceed with the heart valve surgery at the discretion of Dr. Roxy Manns after adequate healing.   Lenn Cal, DDS.

## 2016-03-31 ENCOUNTER — Encounter (HOSPITAL_COMMUNITY): Payer: Self-pay | Admitting: Dentistry

## 2016-03-31 DIAGNOSIS — K08109 Complete loss of teeth, unspecified cause, unspecified class: Secondary | ICD-10-CM

## 2016-03-31 DIAGNOSIS — K0820 Unspecified atrophy of edentulous alveolar ridge: Secondary | ICD-10-CM

## 2016-03-31 DIAGNOSIS — I341 Nonrheumatic mitral (valve) prolapse: Secondary | ICD-10-CM

## 2016-03-31 LAB — CBC
HCT: 34.7 % — ABNORMAL LOW (ref 36.0–46.0)
HEMOGLOBIN: 10.9 g/dL — AB (ref 12.0–15.0)
MCH: 27 pg (ref 26.0–34.0)
MCHC: 31.4 g/dL (ref 30.0–36.0)
MCV: 85.9 fL (ref 78.0–100.0)
Platelets: 147 10*3/uL — ABNORMAL LOW (ref 150–400)
RBC: 4.04 MIL/uL (ref 3.87–5.11)
RDW: 16.6 % — ABNORMAL HIGH (ref 11.5–15.5)
WBC: 8.3 10*3/uL (ref 4.0–10.5)

## 2016-03-31 LAB — HEPARIN LEVEL (UNFRACTIONATED)
HEPARIN UNFRACTIONATED: 0.84 [IU]/mL — AB (ref 0.30–0.70)
HEPARIN UNFRACTIONATED: 0.51 [IU]/mL (ref 0.30–0.70)

## 2016-03-31 LAB — PROTIME-INR
INR: 1.66
Prothrombin Time: 19.8 seconds — ABNORMAL HIGH (ref 11.4–15.2)

## 2016-03-31 LAB — BASIC METABOLIC PANEL
Anion gap: 11 (ref 5–15)
BUN: 14 mg/dL (ref 6–20)
CALCIUM: 9.6 mg/dL (ref 8.9–10.3)
CO2: 23 mmol/L (ref 22–32)
CREATININE: 0.97 mg/dL (ref 0.44–1.00)
Chloride: 104 mmol/L (ref 101–111)
GFR calc non Af Amer: >60 mL/min (ref >60)
Glucose, Bld: 127 mg/dL — ABNORMAL HIGH (ref 65–99)
Potassium: 4.8 mmol/L (ref 3.5–5.1)
SODIUM: 138 mmol/L (ref 135–145)

## 2016-03-31 MED ORDER — AMINOCAPROIC ACID SOLUTION 5% (50 MG/ML)
10.0000 mL | ORAL | Status: AC
  Administered 2016-03-31 (×4): 10 mL via ORAL
  Filled 2016-03-31 (×2): qty 100

## 2016-03-31 MED ORDER — HEPARIN (PORCINE) IN NACL 100-0.45 UNIT/ML-% IJ SOLN
600.0000 [IU]/h | INTRAMUSCULAR | Status: DC
  Administered 2016-04-01: 600 [IU]/h via INTRAVENOUS
  Filled 2016-03-31: qty 250

## 2016-03-31 MED ORDER — WARFARIN - PHARMACIST DOSING INPATIENT
Freq: Every day | Status: DC
  Administered 2016-04-02: 18:00:00

## 2016-03-31 MED ORDER — ENSURE ENLIVE PO LIQD
237.0000 mL | Freq: Two times a day (BID) | ORAL | Status: DC
  Administered 2016-04-01 – 2016-04-03 (×4): 237 mL via ORAL

## 2016-03-31 MED ORDER — WARFARIN SODIUM 7.5 MG PO TABS
7.5000 mg | ORAL_TABLET | Freq: Once | ORAL | Status: AC
  Administered 2016-03-31: 7.5 mg via ORAL
  Filled 2016-03-31: qty 1

## 2016-03-31 NOTE — Progress Notes (Signed)
ANTICOAGULATION CONSULT NOTE - Follow Up Consult  Pharmacy Consult:  Heparin Indication: atrial fibrillation  Allergies  Allergen Reactions  . No Known Allergies     Patient Measurements: Height: 5\' 2"  (157.5 cm) Weight: 158 lb 9.6 oz (71.9 kg) IBW/kg (Calculated) : 50.1 Heparin Dosing Weight: 66 kg  Vital Signs: Temp: 98.3 F (36.8 C) (11/22 0533) Temp Source: Oral (11/22 0533) BP: 120/40 (11/22 0533) Pulse Rate: 61 (11/22 0533)  Labs:  Recent Labs  03/29/16 0239 03/30/16 0352 03/31/16 0434  HGB 10.9*  --  10.9*  HCT 35.4*  --  34.7*  PLT 139*  --  147*  LABPROT 17.4* 18.3* 19.8*  INR 1.41 1.50 1.66  HEPARINUNFRC 0.38  --  0.84*  CREATININE 0.83  --  0.97    Estimated Creatinine Clearance: 54.4 mL/min (by C-G formula based on SCr of 0.97 mg/dL).     Assessment: 89 YOF with history of Afib on Coumadin PTA.  She is s/p cath on 03/25/16 and teeth extractions on 03/30/16.  Currently on IV heparin.    Heparin rate was reduced to 600 units/hr this AM for supra-therapeutic level.  Heparin rate was switched with KVO NS rate, so it has been infusing at 1000 units/hr instead.  No bleeding reported/observed.  Expect heparin level to be higher now, making it less plausible to check a stat heparin level and delay rate adjustments.   Goal of Therapy:  INR 2-3 Heparin level 0.3-0.7 units/ml Monitor platelets by anticoagulation protocol: Yes    Plan:  - Hold heparin x 1 hr, then resume at 600 units/hr, no bolus (RN aware) - Check 6 hr heparin level post gtt resumed - Daily heparin level, CBC, INR - F/U with order to resume Coumadin   Mikaylee Arseneau D. Mina Marble, PharmD, BCPS Pager:  [phone] 03/31/2016, 1:29 PM

## 2016-03-31 NOTE — Progress Notes (Signed)
ANTICOAGULATION CONSULT NOTE - Follow Up Consult  Pharmacy Consult:  Heparin Indication: atrial fibrillation  Allergies  Allergen Reactions  . No Known Allergies     Patient Measurements: Height: 5\' 2"  (157.5 cm) Weight: 158 lb 9.6 oz (71.9 kg) IBW/kg (Calculated) : 50.1 Heparin Dosing Weight: 66 kg  Vital Signs: Temp: 98.8 F (37.1 C) (11/22 2025) Temp Source: Oral (11/22 2025) BP: 125/48 (11/22 2025) Pulse Rate: 69 (11/22 2025)  Labs:  Recent Labs  03/29/16 0239 03/30/16 0352 03/31/16 0434 03/31/16 2049  HGB 10.9*  --  10.9*  --   HCT 35.4*  --  34.7*  --   PLT 139*  --  147*  --   LABPROT 17.4* 18.3* 19.8*  --   INR 1.41 1.50 1.66  --   HEPARINUNFRC 0.38  --  0.84* 0.51  CREATININE 0.83  --  0.97  --     Estimated Creatinine Clearance: 54.4 mL/min (by C-G formula based on SCr of 0.97 mg/dL).     Assessment: 39 YOF with history of Afib on Coumadin PTA.  She is s/p cath on 03/25/16 and teeth extractions on 03/30/16.   Earlier today, heparin rate was reduced to 600 units/hr this AM for supra-therapeutic level.  The heparin rate was switched with KVO NS rate, so it had been infusing at 1000 units/hr instead.  -heparin level is now at goal on 600 units/hr  Goal of Therapy:  INR 2-3 Heparin level 0.3-0.7 units/ml Monitor platelets by anticoagulation protocol: Yes    Plan:  -No heparin changes needed - Daily heparin level, CBC, INR  Hildred Laser, Pharm D 03/31/2016 10:21 PM

## 2016-03-31 NOTE — Progress Notes (Signed)
ANTICOAGULATION CONSULT NOTE - Follow Up Consult  Pharmacy Consult for heparin Indication: atrial fibrillation  Labs:  Recent Labs  03/29/16 0239 03/30/16 0352 03/31/16 0434  HGB 10.9*  --  10.9*  HCT 35.4*  --  34.7*  PLT 139*  --  147*  LABPROT 17.4* 18.3* 19.8*  INR 1.41 1.50 1.66  HEPARINUNFRC 0.38  --  0.84*  CREATININE 0.83  --  0.97    Assessment: 65yo female above goal on heparin after resumed; unexpected since heparin levels had been steady at current rate but RN reports that lab was drawn from arm opposite from heparin gtt infusion site.  Goal of Therapy:  Heparin level 0.3-0.7 units/ml   Plan:  Will decrease heparin gtt to 600 units/hr and check level in [place], PharmD, BCPS  03/31/2016,6:24 AM

## 2016-03-31 NOTE — Progress Notes (Signed)
Patient ID: Marie Malone, female   DOB: 1951-03-24, 65 y.o.   MRN: [id number]    PROGRESS NOTE    Marie Malone  KWI:[id number] DOB: [birth year] DOA: 03/21/2016  PCP: [name], NP   Brief Narrative:  65 y.o.femalewith medical history significant for hypertension, dyslipidemia, known mitral stenosis and moderate to severe pulmonary hypertension, history of prior CVA, recent right carotid endarterectomy on 11/1, A2 fibrillation on warfarin, prior aortic valvulotomy in 2008. Patient has been having issues with persistent headache and operative site discomfort since the surgical procedure. Patient took 2 Tylenol PM's prior to going to bed around 11 PM and woke up just before 2 AM and ask for water. When her husband returned patient was sitting up in the bed but "staring off"and would not respond to him.  Assessment & Plan: Near syncope, secondary to orthostatics and ? A-fib/MS related  - no recurrence since admission - Has second degree AV block, Mobitz type 1 on beta blocker but without bradycardia or lengthy pauses - keep on tele for now -cardiology consulted and follwing.   Moderate (to severe?) MS - gradients are high on TEE - pt is now status post right/left cardiac cath - conclusion below  The left ventricular systolic function is normal.  The left ventricular ejection fraction is 55-65% by visual estimate.  There is no aortic valve stenosis.  There is moderate mitral valve stenosis. Mean gradient 10 mm Hg. MV area 1.3 cm2  Hemodynamic findings consistent with moderate pulmonary hypertension and mitral valve stenosis. - continue to follow up on cardiology team recommendations  - plan for MVR in several weeks per TCTS -underwent multiple teeth extractions, alveoloplasty 11-21.  PAFib - resume Coumadin today.  - pharmacy can continue dosing please  -cardio recommending bridge with heparin. Patient decline lovenox, it causes hematoma.   Sinus  tachycardia - resolved, HR at target range, in 80's this AM - continue Metoprolol 25 mg PO QD  Recent R CEA - Poor postop PO intake and maybe neurovegetative impact of carotid sinus manipulation may have contributed to recent labile BP - BP currently on low end of normal   DVT prophylaxis: Heparin --> transition to Coumadin when cardiology OK's Code Status: Full Family Communication: Patient and husband at bedside  Disposition Plan: home once INR therapeutic.   Consultants:   Cardiology   TCTS  Dental - Dr. Enrique Sack  Procedures:   Cardiac cath 11/16 -->  Antimicrobials:   None  Subjective: No events overnight. Feeling well.   Objective: Vitals:   03/30/16 2237 03/31/16 0529 03/31/16 0533 03/31/16 0826  BP: (!) 148/69 (!) 127/50 (!) 120/40   Pulse:  60 61   Resp:  17 17   Temp:  98 F (36.7 C) 98.3 F (36.8 C)   TempSrc:  Oral Oral   SpO2:  100% 100% 100%  Weight:      Height:        Intake/Output Summary (Last 24 hours) at 03/31/16 1341 Last data filed at 03/31/16 0800  Gross per 24 hour  Intake              875 ml  Output              150 ml  Net              725 ml   Filed Weights   03/21/16 1504 03/24/16 1140 03/26/16 0500  Weight: 72.8 kg (160 lb 6.4 oz) 72.8 kg (160 lb 6.4 oz)  71.9 kg (158 lb 9.6 oz)    Examination:  General exam: Appears calm and comfortable  Respiratory system: Clear to auscultation. Respiratory effort normal. Cardiovascular system: S1 & S2 heard, RRR. No JVD, rubs, gallops or clicks. No pedal edema. Gastrointestinal system: Abdomen is nondistended, soft and nontender. No organomegaly or masses felt. Normal bowel sounds heard. Skin: No rashes, lesions or ulcers Psychiatry: Judgement and insight appear normal. Mood & affect appropriate.    Data Reviewed: I have personally reviewed following labs and imaging studies  CBC:  Recent Labs Lab 03/26/16 0715 03/27/16 0258 03/28/16 0336 03/29/16 0239 03/31/16 0434   WBC 4.8 4.2 4.0 4.0 8.3  HGB 10.9* 11.6* 11.3* 10.9* 10.9*  HCT 35.5* 37.6 36.4 35.4* 34.7*  MCV 85.5 84.9 84.8 85.3 85.9  PLT 184 164 141* 139* 160*   Basic Metabolic Panel:  Recent Labs Lab 03/27/16 0258 03/28/16 0336 03/29/16 0239 03/31/16 0434  NA 138 140 139 138  K 4.0 3.8 3.8 4.8  CL 107 109 110 104  CO2 21* 20* 21* 23  GLUCOSE 102* 93 92 127*  BUN 14 13 13 14   CREATININE 0.85 0.82 0.83 0.97  CALCIUM 9.4 9.6 9.4 9.6   Liver Function Tests:  Recent Labs Lab 03/27/16 0258  AST 22  ALT 19  ALKPHOS 65  BILITOT 1.0  PROT 7.8  ALBUMIN 3.5   Coagulation Profile:  Recent Labs Lab 03/27/16 0258 03/28/16 0336 03/29/16 0239 03/30/16 0352 03/31/16 0434  INR 1.24 1.40 1.41 1.50 1.66   BNP (last 3 results)  Recent Labs  10/10/15 1343 01/20/16 1539  PROBNP 238.0* 136.0*   Urine analysis:    Component Value Date/Time   COLORURINE YELLOW 03/26/2016 2019   APPEARANCEUR CLEAR 03/26/2016 2019   LABSPEC 1.015 03/26/2016 2019   PHURINE 6.5 03/26/2016 2019   GLUCOSEU NEGATIVE 03/26/2016 2019   GLUCOSEU NEGATIVE 07/22/2015 [place] NEGATIVE 03/26/2016 2019   [place] NEGATIVE 03/26/2016 2019   [place] NEGATIVE 03/26/2016 2019   PROTEINUR NEGATIVE 03/26/2016 2019   UROBILINOGEN 0.2 07/22/2015 1444   NITRITE NEGATIVE 03/26/2016 2019   LEUKOCYTESUR NEGATIVE 03/26/2016 2019   Radiology Studies: No results found.  Scheduled Meds: . aminocaproic acid  10 mL Oral Q1H  . atorvastatin  80 mg Oral q1800  . chlorhexidine  15 mL Mouth/Throat BID  . docusate sodium  100 mg Oral BID  . feeding supplement (ENSURE ENLIVE)  237 mL Oral BID BM  . metoprolol succinate  25 mg Oral Daily  . polyethylene glycol  17 g Oral Daily  . sodium chloride flush  3 mL Intravenous Q12H   Continuous Infusions: . heparin    . lactated ringers 10 mL/hr at 03/30/16 1130     LOS: 8 days   Time spent: 20 minutes   Elmarie Shiley, MD [facility] Pager  [phone]  If 7PM-7AM, please contact night-coverage [email or web address] Password [facility] 03/31/2016, 1:41 PM

## 2016-03-31 NOTE — Care Management Note (Signed)
Case Management Note Previous CM note initiated by Sharin Mons, RN 03/23/2016, 7:58 PM   Patient Details  Name: Marie Malone MRN: [id number] Date of Birth: Sep 25, 1950  Subjective/Objective:      Presented with AMS, hx of hypertension, dyslipidemia,  mitral stenosis and moderate to severe pulmonary hypertension, CVA, recent right carotid endarterectomy on 03/10/16, Atrialfibrillation,  aortic valvulotomy in 2008. From home with husband. States independent with ADL's, no DME usage.      Recently admitted to the hospital from 11/10-11/11 after a "staring" spell. Work up was non-revealing.   - s/p TEE 03/24/2016, plan R heart cath on 03/25/2016    Ja'Net Keough (Daughter)      [phone]       PCP: Debbrah Alar  Action/Plan: Return to home when medically stable. CM to f/u with disposition needs.  Expected Discharge Date:     03/31/16             Expected Discharge Plan:  [place]  In-House Referral:     Discharge planning Services  CM Consult  Post Acute Care Choice:  Home Health Choice offered to:  Patient  DME Arranged:    DME Agency:     HH Arranged:  RN [place] Agency:  [facility]  Status of Service:  Completed, signed off  If discussed at [place] of Stay Meetings, dates discussed:    Additional Comments:  03/31/16- 1100- Conlan Miceli RN, CM- f/u done with pt at bedside for [facility]- per pt she has chosen [facility] for services- s/p dental extractions- plan for MVR in the next 2-3 weeks after she heals- referral called to Santiago Glad with [facility] for [facility]- no other CM needs noted.   03/26/16- 1400- Vandy Fong RN, CM- referral for [facility]- spoke with pt at bedside- list provided for [facility] agencies for choice- per pt plan is for surgery next week possibly Tues?- will hold off on [facility] arrangement as d/c plan may change- pt will hold on to list for possible HH needs- CM to continue to follow.   Dahlia Client Valeria,  RN 03/31/2016, 11:11 AM [phone]

## 2016-03-31 NOTE — Progress Notes (Signed)
      [address]       [place] [address]             [phone]     CARDIOTHORACIC SURGERY PROGRESS NOTE  1 Day Post-Op  S/P Procedure(s) (LRB): EXTRACTION OF TOOTH #'S 2,5,6,11,15,16,20-30 AND 32 WITH ALVEOLOPLASTY AND BILATERAL MAXILLARY LATERAL EXOSTOSES REDUCTIONS (N/A)  Subjective: Feels sore in mouth but better than yesterday  Objective: Vital signs in last 24 hours: Temp:  [97.2 F (36.2 C)-98.3 F (36.8 C)] 98.3 F (36.8 C) (11/22 0533) Pulse Rate:  [60-84] 61 (11/22 0533) Cardiac Rhythm: Atrial fibrillation (11/22 0704) Resp:  [12-19] 17 (11/22 0533) BP: (120-172)/(40-71) 120/40 (11/22 0533) SpO2:  [100 %] 100 % (11/22 0826)  Physical Exam:  Rhythm:   Afib  Breath sounds: clear  Heart sounds:  irregular  Incisions:  N/a - mouth swollen  Abdomen:  Soft, non-distended, non-tender  Extremities:  Warm, well-perfused    Intake/Output from previous day: 11/21 0701 - 11/22 0700 In: 1594 [P.O.:600; I.V.:835] Out: 1050 [Urine:950; Blood:100] Intake/Output this shift: No intake/output data recorded.  Lab Results:  Recent Labs  03/29/16 0239 03/31/16 0434  WBC 4.0 8.3  HGB 10.9* 10.9*  HCT 35.4* 34.7*  PLT 139* 147*   BMET:  Recent Labs  03/29/16 0239 03/31/16 0434  NA 139 138  K 3.8 4.8  CL 110 104  CO2 21* 23  GLUCOSE 92 127*  BUN 13 14  CREATININE 0.83 0.97  CALCIUM 9.4 9.6    CBG (last 3)  No results for input(s): GLUCAP in the last 72 hours. PT/INR:   Recent Labs  03/31/16 0434  LABPROT 19.8*  INR 1.66    CXR:  N/A  Assessment/Plan: S/P Procedure(s) (LRB): EXTRACTION OF TOOTH #'S 2,5,6,11,15,16,20-30 AND 32 WITH ALVEOLOPLASTY AND BILATERAL MAXILLARY LATERAL EXOSTOSES REDUCTIONS (N/A)  Patient doing well following dental extraction I agree w/ plans to resume warfarin I will arrange for patient to see me in the office in 2-3 weeks, at which time we will discuss timing of MVR + maze procedure  I spent in  excess of 15 minutes during the conduct of this hospital encounter and >50% of this time involved direct face-to-face encounter with the patient for counseling and/or coordination of their care.   Rexene Alberts, MD 03/31/2016 9:26 AM

## 2016-03-31 NOTE — Progress Notes (Signed)
Initial Nutrition Assessment  DOCUMENTATION CODES:   Not applicable  INTERVENTION:   -Ensure Enlive po BID, each supplement provides 350 kcal and 20 grams of protein -RD will follow for diet advancement and supplement as appropriate  NUTRITION DIAGNOSIS:   Inadequate oral intake related to poor appetite (masticatory difficulty) as evidenced by per patient/family report.  GOAL:   Patient will meet greater than or equal to 90% of their needs  MONITOR:   PO intake, Supplement acceptance, Diet advancement, Labs, Weight trends, Skin, I & O's  REASON FOR ASSESSMENT:   Consult Assessment of nutrition requirement/status (pt is edentulous)  ASSESSMENT:   Marie Malone is a 65 y.o. female with medical history significant for hypertension, dyslipidemia, known mitral stenosis and moderate to severe pulmonary hypertension, history of prior CVA, recent right carotid endarterectomy on 03/10/16, Atrial fibrillation on warfarin, prior aortic valvulotomy in 2008. She was admitted to the hospital from 11/10-11/11 after a "staring" spell. Work up was non-revealing and her symptoms had completely resolved. She was discharged home did well throughout the day, but had 2 more episodes last night  Pt admitted with AMS.   Pt found to have severe mitral stenosis with plan for mitral valve replacement in the future.   S/p Procedure(s) (LRB) on 01/29/16: EXTRACTION OF TOOTH #'S 2,5,6,11,15,16,20-30 AND 32 WITH ALVEOLOPLASTY AND BILATERAL MAXILLARY LATERAL EXOSTOSES REDUCTIONS (N/A)  Case discussed with RN prior to visiting pt. Pt is tolerating clear liquids well with plan to advance diet to full liquids for lunch.   Spoke with pt and husband at bedside. Both reports limited appetite and opal intake secondary to tooth pain. Meal completion 50-100% per doc flowsheets. Pt shares that she is ready to try full liquids, but does not feel prepared to try solid foods just yet. RD educated pt on components of  full liquid diet and assured pt that diet could be mechanically altered (purred vs mechanical soft) to assist with masticatory difficulty related to lack of teeth. Pt also amenable to Ensure supplements, which RD will order.   Pt shares that she "lost a pound or two" since last admission approximately 2 weeks ago. Reviewed wt hx; pt has experienced a 7.6% wt loss over the past month, which is significant for time frame.   Nutrition-Focused physical exam completed. Findings are mild fat depletion, no muscle depletion, and no edema.   Discussed importance of continued good meal and supplements intake to promote healing, especially in light of upcoming surgery. Pt is optimistic that PO intake will improve secondary to tooth extractions.   Labs reviewed.   Diet Order:  Diet full liquid Room service appropriate? Yes; Fluid consistency: Thin  Skin:  Reviewed, no issues  Last BM:  03/29/16  Height:   Ht Readings from Last 1 Encounters:  03/24/16 5\' 2"  (1.575 m)    Weight:   Wt Readings from Last 1 Encounters:  03/26/16 158 lb 9.6 oz (71.9 kg)    Ideal Body Weight:  50 kg  BMI:  Body mass index is 29.01 kg/m.  Estimated Nutritional Needs:   Kcal:  1650-1850  Protein:  85-100 grams  Fluid:  1.6-1.8 L  EDUCATION NEEDS:   Education needs addressed  Marie Malone A. Jimmye Norman, RD, LDN, CDE Pager: [phone] After hours Pager: [phone]

## 2016-03-31 NOTE — Progress Notes (Signed)
POST OPERATIVE NOTE:  03/31/2016 Marie Malone [id number]  VITALS: BP (!) 120/40 (BP Location: Right Arm)   Pulse 61   Temp 98.3 F (36.8 C) (Oral)   Resp 17   Ht 5\' 2"  (1.575 m)   Wt 158 lb 9.6 oz (71.9 kg)   SpO2 100%   BMI 29.01 kg/m   LABS:  Lab Results  Component Value Date   WBC 8.3 03/31/2016   HGB 10.9 (L) 03/31/2016   HCT 34.7 (L) 03/31/2016   MCV 85.9 03/31/2016   PLT 147 (L) 03/31/2016   BMET    Component Value Date/Time   NA 138 03/31/2016 0434   K 4.8 03/31/2016 0434   CL 104 03/31/2016 0434   CO2 23 03/31/2016 0434   GLUCOSE 127 (H) 03/31/2016 0434   BUN 14 03/31/2016 0434   CREATININE 0.97 03/31/2016 0434   CREATININE 0.98 01/30/2016 1410   CALCIUM 9.6 03/31/2016 0434   GFRNONAA >60 03/31/2016 0434   GFRNONAA 64 12/17/2013 1607   GFRAA >60 03/31/2016 0434   GFRAA 74 12/17/2013 1607    Lab Results  Component Value Date   INR 1.66 03/31/2016   INR 1.50 03/30/2016   INR 1.41 03/29/2016   No results found for: PTT  Recent Labs  03/29/16 0239 03/30/16 0352 03/31/16 0434  HGB 10.9*  --  10.9*  HCT 35.4*  --  34.7*  PLT 139*  --  147*  LABPROT 17.4* 18.3* 19.8*  INR 1.41 1.50 1.66  HEPARINUNFRC 0.38  --  0.84*  CREATININE 0.83  --  0.97    Philomena J Santerre is status post extraction of remaining teeth with alveoloplasty and pre-prosthetic surgery in the OR on 03/30/16. Patient with anticipated mitral valve surgery in the future with Dr. Roxy Manns.  SUBJECTIVE: Patient with some complaints of dental pain that is relieved with pain medication.   EXAM: No signs of infection or ooze. Heme is noted with evidence of blood clots in mouth. Sutures are intact. Extraoral and intraoral bruising noted.   ASSESSMENT: Post operative course is consistent with dental procedures performed in the OR. Heme in mouth consistent with elevated heparin level. Patient is edentulous. There is atrophy of edentulous alveolar ridges.   PLAN: 1. Use Amicar 5  % oral rinses every hour for 10 doses (in a swish and spit manner )and then as needed for persistent oozing. 2. Gentle Chlorhexidine rinses twice daily as prescribed. 3. MVR to be scheduled at the discretion of Dr. Roxy Manns.    Lenn Cal, DDS

## 2016-03-31 NOTE — Progress Notes (Signed)
ANTICOAGULATION CONSULT NOTE - Follow Up Consult  Pharmacy Consult:  Heparin Indication: atrial fibrillation  Allergies  Allergen Reactions  . No Known Allergies     Patient Measurements: Height: 5\' 2"  (157.5 cm) Weight: 158 lb 9.6 oz (71.9 kg) IBW/kg (Calculated) : 50.1 Heparin Dosing Weight: 66 kg  Vital Signs: Temp: 98.3 F (36.8 C) (11/22 0533) Temp Source: Oral (11/22 0533) BP: 120/40 (11/22 0533) Pulse Rate: 61 (11/22 0533)  Labs:  Recent Labs  03/29/16 0239 03/30/16 0352 03/31/16 0434  HGB 10.9*  --  10.9*  HCT 35.4*  --  34.7*  PLT 139*  --  147*  LABPROT 17.4* 18.3* 19.8*  INR 1.41 1.50 1.66  HEPARINUNFRC 0.38  --  0.84*  CREATININE 0.83  --  0.97    Estimated Creatinine Clearance: 54.4 mL/min (by C-G formula based on SCr of 0.97 mg/dL).     Assessment: 33 YOF with history of Afib on Coumadin PTA.  She is s/p cath on 03/25/16 and teeth extractions on 03/30/16.  Currently on IV heparin.    Heparin rate was reduced to 600 units/hr this AM for supra-therapeutic level.  Heparin rate was switched with KVO NS rate, so it has been infusing at 1000 units/hr instead.  No bleeding reported/observed.  Expect heparin level to be higher now, making it less plausible to check a stat heparin level and delay rate adjustments.   Goal of Therapy:  INR 2-3 Heparin level 0.3-0.7 units/ml Monitor platelets by anticoagulation protocol: Yes    Plan:  - Hold heparin x 1 hr, then resume at 600 units/hr, no bolus (RN aware) - Check 6 hr heparin level post gtt resumed - Daily heparin level, CBC, INR - F/U with order to resume Coumadin   Steward Sames D. Mina Marble, PharmD, BCPS Pager:  [phone] 03/31/2016, 1:42 PM   =========================   Addendum: - resume Coumadin - INR 1.66 - Coumadin 7.5mg  PO today - Daily PT / INR    Laiken Nohr D. Mina Marble, PharmD, BCPS Pager:  [phone] 03/31/2016, 1:43 PM

## 2016-03-31 NOTE — Progress Notes (Signed)
Subjective:  No CP/SOB. S/P dental extraction under GA by Dr Enrique Sack. On IV hep  Objective:  Temp:  [97.2 F (36.2 C)-98.3 F (36.8 C)] 98.3 F (36.8 C) (11/22 0533) Pulse Rate:  [60-84] 61 (11/22 0533) Resp:  [12-19] 17 (11/22 0533) BP: (120-172)/(40-71) 120/40 (11/22 0533) SpO2:  [100 %] 100 % (11/22 0826) Weight change:   Intake/Output from previous day: 11/21 0701 - 11/22 0700 In: 7096 [P.O.:600; I.V.:835] Out: 1050 [Urine:950; Blood:100]  Intake/Output from this shift: No intake/output data recorded.  Physical Exam: General appearance: alert and no distress Neck: no adenopathy, no carotid bruit, no JVD, supple, symmetrical, trachea midline and thyroid not enlarged, symmetric, no tenderness/mass/nodules Lungs: clear to auscultation bilaterally Heart: irregularly irregular rhythm Extremities: extremities normal, atraumatic, no cyanosis or edema  Lab Results: Results for orders placed or performed during the hospital encounter of 03/21/16 (from the past 48 hour(s))  Protime-INR     Status: Abnormal   Collection Time: 03/30/16  3:52 AM  Result Value Ref Range   Prothrombin Time 18.3 (H) 11.4 - 15.2 seconds   INR 1.50   Protime-INR     Status: Abnormal   Collection Time: 03/31/16  4:34 AM  Result Value Ref Range   Prothrombin Time 19.8 (H) 11.4 - 15.2 seconds   INR 1.66   CBC     Status: Abnormal   Collection Time: 03/31/16  4:34 AM  Result Value Ref Range   WBC 8.3 4.0 - 10.5 K/uL   RBC 4.04 3.87 - 5.11 MIL/uL   Hemoglobin 10.9 (L) 12.0 - 15.0 g/dL   HCT 34.7 (L) 36.0 - 46.0 %   MCV 85.9 78.0 - 100.0 fL   MCH 27.0 26.0 - 34.0 pg   MCHC 31.4 30.0 - 36.0 g/dL   RDW 16.6 (H) 11.5 - 15.5 %   Platelets 147 (L) 150 - 400 K/uL  Basic metabolic panel     Status: Abnormal   Collection Time: 03/31/16  4:34 AM  Result Value Ref Range   Sodium 138 135 - 145 mmol/L   Potassium 4.8 3.5 - 5.1 mmol/L   Chloride 104 101 - 111 mmol/L   CO2 23 22 - 32 mmol/L   Glucose, Bld 127 (H) 65 - 99 mg/dL   BUN 14 6 - 20 mg/dL   Creatinine, Ser 0.97 0.44 - 1.00 mg/dL   Calcium 9.6 8.9 - 10.3 mg/dL   GFR calc non Af Amer >60 >60 mL/min   GFR calc Af Amer >60 >60 mL/min    Comment: (NOTE) The eGFR has been calculated using the CKD EPI equation. This calculation has not been validated in all clinical situations. eGFR's persistently <60 mL/min signify possible Chronic Kidney Disease.    Anion gap 11 5 - 15  Heparin level (unfractionated)     Status: Abnormal   Collection Time: 03/31/16  4:34 AM  Result Value Ref Range   Heparin Unfractionated 0.84 (H) 0.30 - 0.70 IU/mL    Comment:        IF HEPARIN RESULTS ARE BELOW EXPECTED VALUES, AND PATIENT DOSAGE HAS BEEN CONFIRMED, SUGGEST FOLLOW UP TESTING OF ANTITHROMBIN III LEVELS.     Imaging: Imaging results have been reviewed  Tele- Afib with VR 60-70  Assessment/Plan:   1. Principal Problem: 2.   Transient alteration of awareness 3. Active Problems: 4.   HTN (hypertension) 5.   Mitral valve stenosis, rheumatic 6.   Paroxysmal atrial fibrillation (HCC) 7.   HLD (hyperlipidemia)  8.   Asymptomatic stenosis of right carotid artery s/p right CEA 03/10/16 9.   Constipation 10.   Altered mental status 11.   Tongue deviation 12.   Second degree Mobitz I AV block 13.   Syncope 14.   Time Spent Directly with Patient:  20 minutes  Length of Stay:  LOS: 8 days   S/P successful dental extraction by Dr Enrique Sack in preparation for MVR by Dr Roxy Manns. On IV hep. Afib with CVR. Can restart coumadin per pharm if ok with Dr Enrique Sack and DC home once INR therapeutic. F/U with Drs Roxy Manns and [name] in 2-3 weeks to orchestrate timing of MVR. Will S/O.   Quay Burow 03/31/2016, 8:51 AM

## 2016-04-01 DIAGNOSIS — I2721 Secondary pulmonary arterial hypertension: Secondary | ICD-10-CM

## 2016-04-01 LAB — CBC
HCT: 33 % — ABNORMAL LOW (ref 36.0–46.0)
HEMOGLOBIN: 10.4 g/dL — AB (ref 12.0–15.0)
MCH: 26.9 pg (ref 26.0–34.0)
MCHC: 31.5 g/dL (ref 30.0–36.0)
MCV: 85.5 fL (ref 78.0–100.0)
Platelets: 124 10*3/uL — ABNORMAL LOW (ref 150–400)
RBC: 3.86 MIL/uL — AB (ref 3.87–5.11)
RDW: 16.4 % — ABNORMAL HIGH (ref 11.5–15.5)
WBC: 7.4 10*3/uL (ref 4.0–10.5)

## 2016-04-01 LAB — PROTIME-INR
INR: 1.8
Prothrombin Time: 21.1 seconds — ABNORMAL HIGH (ref 11.4–15.2)

## 2016-04-01 LAB — HEPARIN LEVEL (UNFRACTIONATED): Heparin Unfractionated: 0.28 IU/mL — ABNORMAL LOW (ref 0.30–0.70)

## 2016-04-01 MED ORDER — WARFARIN SODIUM 5 MG PO TABS
5.0000 mg | ORAL_TABLET | Freq: Once | ORAL | Status: AC
  Administered 2016-04-01: 5 mg via ORAL
  Filled 2016-04-01: qty 1

## 2016-04-01 NOTE — Progress Notes (Signed)
Patient ID: Marie Malone, female   DOB: [birth year], 65 y.o.   MRN: [id number]    PROGRESS NOTE    Marie Malone  TIR:[id number] DOB: 1950/12/26 DOA: 03/21/2016  PCP: [name], NP   Brief Narrative:  65 y.o.femalewith medical history significant for hypertension, dyslipidemia, known mitral stenosis and moderate to severe pulmonary hypertension, history of prior CVA, recent right carotid endarterectomy on 11/1, A2 fibrillation on warfarin, prior aortic valvulotomy in 2008. Patient has been having issues with persistent headache and operative site discomfort since the surgical procedure. Patient took 2 Tylenol PM's prior to going to bed around 11 PM and woke up just before 2 AM and ask for water. When her husband returned patient was sitting up in the bed but "staring off"and would not respond to him.  Assessment & Plan: Near syncope, secondary to orthostatics and ? A-fib/MS related  - no recurrence since admission - Has second degree AV block, Mobitz type 1 on beta blocker but without bradycardia or lengthy pauses - keep on tele for now -cardiology consulted and follwing.   Moderate (to severe?) MS - gradients are high on TEE - pt is now status post right/left cardiac cath - conclusion below  The left ventricular systolic function is normal.  The left ventricular ejection fraction is 55-65% by visual estimate.  There is no aortic valve stenosis.  There is moderate mitral valve stenosis. Mean gradient 10 mm Hg. MV area 1.3 cm2  Hemodynamic findings consistent with moderate pulmonary hypertension and mitral valve stenosis. - continue to follow up on cardiology team recommendations  - plan for MVR in several weeks per TCTS -underwent multiple teeth extractions, alveoloplasty 11-21.  PAFib - resume Coumadin today.  - pharmacy can continue dosing please  -heparin discontinue due to mouth bleed. Continue with coumadin /  Sinus tachycardia - resolved, HR at  target range, in 80's this AM - continue Metoprolol 25 mg PO QD  Recent R CEA - Poor postop PO intake and maybe neurovegetative impact of carotid sinus manipulation may have contributed to recent labile BP - BP currently on low end of normal   DVT prophylaxis: Heparin --> transition to Coumadin when cardiology OK's Code Status: Full Family Communication: Patient  Disposition Plan: home once INR therapeutic.   Consultants:   Cardiology   TCTS  Dental - Dr. Enrique Sack  Procedures:   Cardiac cath 11/16 -->  Antimicrobials:   None  Subjective: Had intermittent bleeding from mouth, gums, while on heparin. Heparin on hold   Objective: Vitals:   03/31/16 1300 03/31/16 2025 04/01/16 0409 04/01/16 1022  BP: (!) 113/48 (!) 125/48 117/67 (!) 137/52  Pulse: 65 69 73 81  Resp: 18 18 20    Temp: 98.4 F (36.9 C) 98.8 F (37.1 C) 97.4 F (36.3 C)   TempSrc: Oral Oral Tympanic   SpO2: 99% 99% 100%   Weight:      Height:        Intake/Output Summary (Last 24 hours) at 04/01/16 1243 Last data filed at 04/01/16 0703  Gross per 24 hour  Intake              720 ml  Output                0 ml  Net              720 ml   Filed Weights   03/21/16 1504 03/24/16 1140 03/26/16 0500  Weight: 72.8 kg (160 lb 6.4 oz)  72.8 kg (160 lb 6.4 oz) 71.9 kg (158 lb 9.6 oz)    Examination:  General exam: Appears calm and comfortable  Respiratory system: Clear to auscultation. Respiratory effort normal. Cardiovascular system: S1 & S2 heard, RRR. No JVD, rubs, gallops or clicks. No pedal edema. Gastrointestinal system: Abdomen is nondistended, soft and nontender. No organomegaly or masses felt. Normal bowel sounds heard. Skin: No rashes, lesions or ulcers Psychiatry: Judgement and insight appear normal. Mood & affect appropriate.    Data Reviewed: I have personally reviewed following labs and imaging studies  CBC:  Recent Labs Lab 03/27/16 0258 03/28/16 0336 03/29/16 0239  03/31/16 0434 04/01/16 0212  WBC 4.2 4.0 4.0 8.3 7.4  HGB 11.6* 11.3* 10.9* 10.9* 10.4*  HCT 37.6 36.4 35.4* 34.7* 33.0*  MCV 84.9 84.8 85.3 85.9 85.5  PLT 164 141* 139* 147* 191*   Basic Metabolic Panel:  Recent Labs Lab 03/27/16 0258 03/28/16 0336 03/29/16 0239 03/31/16 0434  NA 138 140 139 138  K 4.0 3.8 3.8 4.8  CL 107 109 110 104  CO2 21* 20* 21* 23  GLUCOSE 102* 93 92 127*  BUN 14 13 13 14   CREATININE 0.85 0.82 0.83 0.97  CALCIUM 9.4 9.6 9.4 9.6   Liver Function Tests:  Recent Labs Lab 03/27/16 0258  AST 22  ALT 19  ALKPHOS 65  BILITOT 1.0  PROT 7.8  ALBUMIN 3.5   Coagulation Profile:  Recent Labs Lab 03/28/16 0336 03/29/16 0239 03/30/16 0352 03/31/16 0434 04/01/16 0212  INR 1.40 1.41 1.50 1.66 1.80   BNP (last 3 results)  Recent Labs  10/10/15 1343 01/20/16 1539  PROBNP 238.0* 136.0*   Urine analysis:    Component Value Date/Time   COLORURINE YELLOW 03/26/2016 2019   APPEARANCEUR CLEAR 03/26/2016 2019   LABSPEC 1.015 03/26/2016 2019   PHURINE 6.5 03/26/2016 2019   GLUCOSEU NEGATIVE 03/26/2016 2019   GLUCOSEU NEGATIVE 07/22/2015 1444   [place] NEGATIVE 03/26/2016 2019   [place] NEGATIVE 03/26/2016 2019   KETONESUR NEGATIVE 03/26/2016 2019   PROTEINUR NEGATIVE 03/26/2016 2019   UROBILINOGEN 0.2 07/22/2015 1444   NITRITE NEGATIVE 03/26/2016 2019   LEUKOCYTESUR NEGATIVE 03/26/2016 2019   Radiology Studies: No results found.  Scheduled Meds: . atorvastatin  80 mg Oral q1800  . chlorhexidine  15 mL Mouth/Throat BID  . docusate sodium  100 mg Oral BID  . feeding supplement (ENSURE ENLIVE)  237 mL Oral BID BM  . metoprolol succinate  25 mg Oral Daily  . polyethylene glycol  17 g Oral Daily  . sodium chloride flush  3 mL Intravenous Q12H  . warfarin  5 mg Oral ONCE-1800  . Warfarin - Pharmacist Dosing Inpatient   Does not apply q1800   Continuous Infusions: . lactated ringers 10 mL/hr at 03/30/16 1130     LOS: 9 days   Time  spent: 20 minutes   Elmarie Shiley, MD [facility] Pager [phone]  If 7PM-7AM, please contact night-coverage [email or web address] Password [facility] 04/01/2016, 12:43 PM

## 2016-04-01 NOTE — Progress Notes (Signed)
ANTICOAGULATION CONSULT NOTE - Follow Up Consult  Pharmacy Consult for heparin Indication: atrial fibrillation  Labs:  Recent Labs  03/30/16 0352 03/31/16 0434 03/31/16 2049 04/01/16 0212  HGB  --  10.9*  --  10.4*  HCT  --  34.7*  --  33.0*  PLT  --  147*  --  124*  LABPROT 18.3* 19.8*  --  21.1*  INR 1.50 1.66  --  1.80  HEPARINUNFRC  --  0.84* 0.51 0.28*  CREATININE  --  0.97  --   --     Assessment: 65yo female now slightly subtherapeutic on heparin after one level at goal; RN reports that pt has had bleeding and clots coming from tooth extraction sites.  RN rec'd order to hold heparin x1hr from provider, now bleeding is controlled.  Goal of Therapy:  Heparin level 0.3-0.7 units/ml   Plan:  Will resume heparin at 600 units/hr at 0500 and check level in 8hr; f/u for further bleeding episodes.  Wynona Neat, PharmD, BCPS  04/01/2016,4:42 AM

## 2016-04-01 NOTE — Progress Notes (Signed)
Pt started to having active bleeding from mouth. Dr. Tyrell Antonio notified Heparin stopped per order. Isac Caddy, RN

## 2016-04-01 NOTE — Progress Notes (Signed)
Pt having actively bleeding from mouth. Baltazar Najjar, NP notified. Orders given to apply guaze to site. Heparin stopped per order. Will restart heparin at 0500. Isac Caddy, RN

## 2016-04-01 NOTE — Progress Notes (Signed)
ANTICOAGULATION CONSULT NOTE - Follow Up Consult  Pharmacy Consult:  Coumadin Indication: atrial fibrillation  Allergies  Allergen Reactions  . No Known Allergies     Patient Measurements: Height: 5\' 2"  (157.5 cm) Weight: 158 lb 9.6 oz (71.9 kg) IBW/kg (Calculated) : 50.1  Vital Signs: Temp: 97.4 F (36.3 C) (11/23 0409) Temp Source: Tympanic (11/23 0409) BP: 137/52 (11/23 1022) Pulse Rate: 81 (11/23 1022)  Labs:  Recent Labs  03/30/16 0352 03/31/16 0434 03/31/16 2049 04/01/16 0212  HGB  --  10.9*  --  10.4*  HCT  --  34.7*  --  33.0*  PLT  --  147*  --  124*  LABPROT 18.3* 19.8*  --  21.1*  INR 1.50 1.66  --  1.80  HEPARINUNFRC  --  0.84* 0.51 0.28*  CREATININE  --  0.97  --   --     Estimated Creatinine Clearance: 54.4 mL/min (by C-G formula based on SCr of 0.97 mg/dL).     Assessment: Marie Malone with history of Afib on Coumadin PTA.  She is s/p cath on 03/25/16 and teeth extractions on 03/30/16.  Patient had severe bleeding with clots last night and earlier today; therefore, heparin was discontinued.  No active bleeding now.  Confirmed with MD that Coumadin is to continue.  INR trending back up toward goal.   Goal of Therapy:  INR 2 - 3    Plan:  - Coumadin 5mg  PO today - Daily PT / INR - Monitor closely for bleeding   Marie Malone, PharmD, BCPS Pager:  [phone] 04/01/2016, 12:16 PM

## 2016-04-01 NOTE — Progress Notes (Signed)
Patient Name: Marie Malone Date of Encounter: 04/01/2016  Primary Cardiologist: [facility] Problem List     Principal Problem:   Transient alteration of awareness Active Problems:   HTN (hypertension)   Mitral valve stenosis, rheumatic   Paroxysmal atrial fibrillation (HCC)   HLD (hyperlipidemia)   Asymptomatic stenosis of right carotid artery s/p right CEA 03/10/16   Constipation   Altered mental status   Tongue deviation   Second degree Mobitz I AV block   Syncope     Subjective   Discomfort from teeth, was bleeding continuously until heparin stopped. INR 1.8. In atrial fibrillation with controlled rate.  Inpatient Medications    Scheduled Meds: . atorvastatin  80 mg Oral q1800  . chlorhexidine  15 mL Mouth/Throat BID  . docusate sodium  100 mg Oral BID  . feeding supplement (ENSURE ENLIVE)  237 mL Oral BID BM  . metoprolol succinate  25 mg Oral Daily  . polyethylene glycol  17 g Oral Daily  . sodium chloride flush  3 mL Intravenous Q12H  . Warfarin - Pharmacist Dosing Inpatient   Does not apply q1800   Continuous Infusions: . heparin Stopped (04/01/16 0737)  . lactated ringers 10 mL/hr at 03/30/16 1130   PRN Meds: sodium chloride, acetaminophen, ketorolac, morphine injection, ondansetron (ZOFRAN) IV, oxyCODONE-acetaminophen, sodium chloride flush   Vital Signs    Vitals:   03/31/16 0826 03/31/16 1300 03/31/16 2025 04/01/16 0409  BP:  (!) 113/48 (!) 125/48 117/67  Pulse:  65 69 73  Resp:  18 18 20   Temp:  98.4 F (36.9 C) 98.8 F (37.1 C) 97.4 F (36.3 C)  TempSrc:  Oral Oral Tympanic  SpO2: 100% 99% 99% 100%  Weight:      Height:        Intake/Output Summary (Last 24 hours) at 04/01/16 0836 Last data filed at 03/31/16 1800  Gross per 24 hour  Intake              720 ml  Output                0 ml  Net              720 ml   Filed Weights   03/21/16 1504 03/24/16 1140 03/26/16 0500  Weight: 160 lb 6.4 oz (72.8 kg) 160 lb 6.4 oz  (72.8 kg) 158 lb 9.6 oz (71.9 kg)    Physical Exam  Uncomfortable, gauze in mouth. GEN: Well nourished, well developed, in no acute distress.  HEENT: Grossly normal.  Neck: Supple, no JVD, carotid bruits, or masses. Cardiac: irregular, early and loud opening snap, very faint apical rumble, rubs, or gallops. No clubbing, cyanosis, edema.  Radials/DP/PT 2+ and equal bilaterally.  Respiratory:  Respirations regular and unlabored, clear to auscultation bilaterally. GI: Soft, nontender, nondistended, BS + x 4. MS: no deformity or atrophy. Skin: warm and dry, no rash. Neuro:  Strength and sensation are intact. Psych: AAOx3.  Normal affect.  Labs    CBC  Recent Labs  03/31/16 0434 04/01/16 0212  WBC 8.3 7.4  HGB 10.9* 10.4*  HCT 34.7* 33.0*  MCV 85.9 85.5  PLT 147* 213*   Basic Metabolic Panel  Recent Labs  03/31/16 0434  NA 138  K 4.8  CL 104  CO2 23  GLUCOSE 127*  BUN 14  CREATININE 0.97  CALCIUM 9.6    Telemetry    AFib  - Personally Reviewed  Patient Profile  65 year old woman with moderate to severemitral stenosis following commissurotomy 2008, s/p recent R CEA presenting with recurrent syncope,with moderate to severe pulmonary HTN. Had multiple tooth extraction in anticipation of surgery and now has persistent gingival bleeding.  Assessment & Plan    1. Syncope: no recurrence since admission.  2. Moderate to severe MS: PAP appears disproportionately elevated for PAWP and she may have a component of fixed pulmonary arteriolar disease. Nevertheless, she would benefit from relief of MS. Valve appears excessively thickened and calcified for another attempt at commissurotomy/repair. Planned MV replacement in 2-3 weeks. 3. PAFib: Resumed Warfarin. 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  4. S/p Multiple extraction of tooth numbers 2, 5, 6, 11, 15, 16,20-30, and 32 with alveolar bleeding. Heparin stopped. INR 1.8 and rising. I would recommend leaving  off heparin for the day, expect INR will be > 2 tomorrow. 5. Recent R CEA 6. Second degree AV block, Mobitz type 1 when in NSR on beta blocker in current dose, but without bradycardia or lengthy pauses.   Signed, [name], MD  04/01/2016, 8:36 AM

## 2016-04-02 DIAGNOSIS — I6521 Occlusion and stenosis of right carotid artery: Secondary | ICD-10-CM

## 2016-04-02 LAB — BASIC METABOLIC PANEL
ANION GAP: 11 (ref 5–15)
BUN: 21 mg/dL — ABNORMAL HIGH (ref 6–20)
CALCIUM: 9.4 mg/dL (ref 8.9–10.3)
CO2: 22 mmol/L (ref 22–32)
CREATININE: 0.92 mg/dL (ref 0.44–1.00)
Chloride: 106 mmol/L (ref 101–111)
Glucose, Bld: 92 mg/dL (ref 65–99)
Potassium: 3.8 mmol/L (ref 3.5–5.1)
SODIUM: 139 mmol/L (ref 135–145)

## 2016-04-02 LAB — CBC
HCT: 30.4 % — ABNORMAL LOW (ref 36.0–46.0)
Hemoglobin: 9.5 g/dL — ABNORMAL LOW (ref 12.0–15.0)
MCH: 27 pg (ref 26.0–34.0)
MCHC: 31.3 g/dL (ref 30.0–36.0)
MCV: 86.4 fL (ref 78.0–100.0)
PLATELETS: 115 10*3/uL — AB (ref 150–400)
RBC: 3.52 MIL/uL — ABNORMAL LOW (ref 3.87–5.11)
RDW: 17 % — AB (ref 11.5–15.5)
WBC: 6.3 10*3/uL (ref 4.0–10.5)

## 2016-04-02 LAB — PROTIME-INR
INR: 2.46
PROTHROMBIN TIME: 27.2 s — AB (ref 11.4–15.2)

## 2016-04-02 MED ORDER — TRAMADOL HCL 50 MG PO TABS
50.0000 mg | ORAL_TABLET | Freq: Four times a day (QID) | ORAL | Status: DC | PRN
  Administered 2016-04-02 – 2016-04-03 (×4): 50 mg via ORAL
  Filled 2016-04-02 (×4): qty 1

## 2016-04-02 MED ORDER — WARFARIN SODIUM 2.5 MG PO TABS
2.5000 mg | ORAL_TABLET | Freq: Once | ORAL | Status: AC
  Administered 2016-04-02: 2.5 mg via ORAL
  Filled 2016-04-02: qty 1

## 2016-04-02 NOTE — Progress Notes (Signed)
ANTICOAGULATION CONSULT NOTE - Follow Up Consult  Pharmacy Consult:  Coumadin Indication: atrial fibrillation  Allergies  Allergen Reactions  . No Known Allergies     Patient Measurements: Height: 5\' 2"  (157.5 cm) Weight: 158 lb 9.6 oz (71.9 kg) IBW/kg (Calculated) : 50.1  Vital Signs: Temp: 98.7 F (37.1 C) (11/24 0512) Temp Source: Oral (11/24 0512) BP: 107/54 (11/24 0512) Pulse Rate: 77 (11/24 0512)  Labs:  Recent Labs  03/31/16 0434 03/31/16 2049 04/01/16 0212 04/02/16 0247  HGB 10.9*  --  10.4* 9.5*  HCT 34.7*  --  33.0* 30.4*  PLT 147*  --  124* 115*  LABPROT 19.8*  --  21.1* 27.2*  INR 1.66  --  1.80 2.46  HEPARINUNFRC 0.84* 0.51 0.28*  --   CREATININE 0.97  --   --  0.92    Estimated Creatinine Clearance: 57.3 mL/min (by C-G formula based on SCr of 0.92 mg/dL).     Assessment: 25 YOF with history of Afib on Coumadin PTA.  She is s/p cath on 03/25/16 and teeth extractions on 03/30/16.  Patient had severe bleeding with clots; therefore heparin was discontinued on 11/23.  Continuing coumadin. -INR= 2.46 (up from 1.8)  Goal of Therapy:  INR 2 - 3    Plan:  - Coumadin 2.5mg  PO today - Daily PT / INR  Hildred Laser, Pharm D 04/02/2016 8:22 AM

## 2016-04-02 NOTE — Progress Notes (Signed)
Patient ID: Marie Malone, female   DOB: [birth year], 65 y.o.   MRN: [id number]    PROGRESS NOTE    Marie Malone  XNT:[id number] DOB: 1951-01-02 DOA: 03/21/2016  PCP: [name], NP   Brief Narrative:  65 y.o.femalewith medical history significant for hypertension, dyslipidemia, known mitral stenosis and moderate to severe pulmonary hypertension, history of prior CVA, recent right carotid endarterectomy on 11/1, A2 fibrillation on warfarin, prior aortic valvulotomy in 2008. Patient has been having issues with persistent headache and operative site discomfort since the surgical procedure. Patient took 2 Tylenol PM's prior to going to bed around 11 PM and woke up just before 2 AM and ask for water. When her husband returned patient was sitting up in the bed but "staring off"and would not respond to him.  Assessment & Plan: 1-Near syncope, secondary to orthostatics and ? A-fib/MS related  - Has second degree AV block, Mobitz type 1 on beta blocker but without bradycardia or lengthy pauses - keep on tele for now -cardiology consulted and follwing.   Moderate (to severe?) MS - gradients are high on TEE - pt is now status post right/left cardiac cath -The left ventricular systolic function is normal. EF 55-65% by visual estimate. There is moderate mitral valve stenosis. Mean gradient 10 mm Hg. MV area 1.3 cm 2. Hemodynamic findings consistent with moderate pulmonary hypertension and mitral valve stenosis. - continue to follow up on cardiology team recommendations  - plan for MVR in several weeks per TCTS -underwent multiple teeth extractions, alveoloplasty 11-21.  PAFib - continue with coumadin  - pharmacy can continue dosing please  -INR at goal/   Sinus tachycardia - resolved, HR at target range, in 80's this AM - continue Metoprolol 25 mg PO QD  Recent R CEA - Poor postop PO intake and maybe neurovegetative impact of carotid sinus manipulation may have  contributed to recent labile BP - BP currently on low end of normal   DVT prophylaxis: Heparin --> transition to Coumadin when cardiology OK's Code Status: Full Family Communication: Patient  Disposition Plan: PT evaluation, monitor for bleeding.   Consultants:   Cardiology   TCTS  Dental - Dr. Enrique Sack  Procedures:   Cardiac cath 11/16 -->  Antimicrobials:   None  Subjective: Doing well, feeling weak, tired.  No further mouth bleeding   Objective: Vitals:   04/01/16 1331 04/01/16 1943 04/02/16 0512 04/02/16 1006  BP: (!) 107/45 (!) 113/59 (!) 107/54   Pulse: 82 80 77 80  Resp: 18 18 18    Temp: 99.1 F (37.3 C) 97.8 F (36.6 C) 98.7 F (37.1 C)   TempSrc: Oral Oral Oral   SpO2: 100% 99% 96%   Weight:      Height:        Intake/Output Summary (Last 24 hours) at 04/02/16 1306 Last data filed at 04/02/16 0730  Gross per 24 hour  Intake              720 ml  Output                0 ml  Net              720 ml   Filed Weights   03/21/16 1504 03/24/16 1140 03/26/16 0500  Weight: 72.8 kg (160 lb 6.4 oz) 72.8 kg (160 lb 6.4 oz) 71.9 kg (158 lb 9.6 oz)    Examination:  General exam: Appears calm and comfortable  Respiratory system: Clear to auscultation. Respiratory  effort normal. Cardiovascular system: S1 & S2 heard, RRR. No JVD, rubs, gallops or clicks. No pedal edema. Gastrointestinal system: Abdomen is nondistended, soft and nontender. No organomegaly or masses felt. Normal bowel sounds heard. Skin: No rashes, lesions or ulcers Psychiatry: Judgement and insight appear normal. Mood & affect appropriate.    Data Reviewed: I have personally reviewed following labs and imaging studies  CBC:  Recent Labs Lab 03/28/16 0336 03/29/16 0239 03/31/16 0434 04/01/16 0212 04/02/16 0247  WBC 4.0 4.0 8.3 7.4 6.3  HGB 11.3* 10.9* 10.9* 10.4* 9.5*  HCT 36.4 35.4* 34.7* 33.0* 30.4*  MCV 84.8 85.3 85.9 85.5 86.4  PLT 141* 139* 147* 124* 115*   Basic  Metabolic Panel:  Recent Labs Lab 03/27/16 0258 03/28/16 0336 03/29/16 0239 03/31/16 0434 04/02/16 0247  NA 138 140 139 138 139  K 4.0 3.8 3.8 4.8 3.8  CL 107 109 110 104 106  CO2 21* 20* 21* 23 22  GLUCOSE 102* 93 92 127* 92  BUN 14 13 13 14  21*  CREATININE 0.85 0.82 0.83 0.97 0.92  CALCIUM 9.4 9.6 9.4 9.6 9.4   Liver Function Tests:  Recent Labs Lab 03/27/16 0258  AST 22  ALT 19  ALKPHOS 65  BILITOT 1.0  PROT 7.8  ALBUMIN 3.5   Coagulation Profile:  Recent Labs Lab 03/29/16 0239 03/30/16 0352 03/31/16 0434 04/01/16 0212 04/02/16 0247  INR 1.41 1.50 1.66 1.80 2.46   BNP (last 3 results)  Recent Labs  10/10/15 1343 01/20/16 1539  PROBNP 238.0* 136.0*   Urine analysis:    Component Value Date/Time   COLORURINE YELLOW 03/26/2016 2019   APPEARANCEUR CLEAR 03/26/2016 2019   LABSPEC 1.015 03/26/2016 2019   PHURINE 6.5 03/26/2016 2019   GLUCOSEU NEGATIVE 03/26/2016 2019   GLUCOSEU NEGATIVE 07/22/2015 1444   [place] NEGATIVE 03/26/2016 2019   [place] NEGATIVE 03/26/2016 2019   KETONESUR NEGATIVE 03/26/2016 2019   PROTEINUR NEGATIVE 03/26/2016 2019   UROBILINOGEN 0.2 07/22/2015 1444   NITRITE NEGATIVE 03/26/2016 2019   LEUKOCYTESUR NEGATIVE 03/26/2016 2019   Radiology Studies: No results found.  Scheduled Meds: . atorvastatin  80 mg Oral q1800  . chlorhexidine  15 mL Mouth/Throat BID  . docusate sodium  100 mg Oral BID  . feeding supplement (ENSURE ENLIVE)  237 mL Oral BID BM  . metoprolol succinate  25 mg Oral Daily  . polyethylene glycol  17 g Oral Daily  . sodium chloride flush  3 mL Intravenous Q12H  . warfarin  2.5 mg Oral ONCE-1800  . Warfarin - Pharmacist Dosing Inpatient   Does not apply q1800   Continuous Infusions: . lactated ringers 10 mL/hr at 03/30/16 1130     LOS: 10 days   Time spent: 20 minutes   Elmarie Shiley, MD [facility] Pager [phone]  If 7PM-7AM, please contact  night-coverage [email or web address] Password [email or web address] 04/02/2016, 1:06 PM

## 2016-04-02 NOTE — Progress Notes (Signed)
Patient Name: Marie Malone Date of Encounter: 04/02/2016  Primary Cardiologist: Dr Sparrow [facility] Problem List     Principal Problem:   Transient alteration of awareness Active Problems:   HTN (hypertension)   Mitral valve stenosis, rheumatic   Paroxysmal atrial fibrillation (HCC)   HLD (hyperlipidemia)   Asymptomatic stenosis of right carotid artery s/p right CEA 03/10/16   Constipation   Altered mental status   Tongue deviation   Second degree Mobitz I AV block   Syncope    Subjective   No chest pain, does not feel afib. Had some ice cream this am, doesn't think mouth is bleeding anymore  Inpatient Medications    Scheduled Meds: . atorvastatin  80 mg Oral q1800  . chlorhexidine  15 mL Mouth/Throat BID  . docusate sodium  100 mg Oral BID  . feeding supplement (ENSURE ENLIVE)  237 mL Oral BID BM  . metoprolol succinate  25 mg Oral Daily  . polyethylene glycol  17 g Oral Daily  . sodium chloride flush  3 mL Intravenous Q12H  . Warfarin - Pharmacist Dosing Inpatient   Does not apply q1800   Continuous Infusions: . lactated ringers 10 mL/hr at 03/30/16 1130   PRN Meds: sodium chloride, acetaminophen, ketorolac, morphine injection, ondansetron (ZOFRAN) IV, oxyCODONE-acetaminophen, sodium chloride flush   Vital Signs    Vitals:   04/01/16 1022 04/01/16 1331 04/01/16 1943 04/02/16 0512  BP: (!) 137/52 (!) 107/45 (!) 113/59 (!) 107/54  Pulse: 81 82 80 77  Resp:  18 18 18   Temp:  99.1 F (37.3 C) 97.8 F (36.6 C) 98.7 F (37.1 C)  TempSrc:  Oral Oral Oral  SpO2:  100% 99% 96%  Weight:      Height:        Intake/Output Summary (Last 24 hours) at 04/02/16 0806 Last data filed at 04/01/16 1630  Gross per 24 hour  Intake              840 ml  Output                0 ml  Net              840 ml   Filed Weights   03/21/16 1504 03/24/16 1140 03/26/16 0500  Weight: 160 lb 6.4 oz (72.8 kg) 160 lb 6.4 oz (72.8 kg) 158 lb 9.6 oz (71.9 kg)    Physical  Exam    GEN: Well nourished, well developed, in no acute distress.  HEENT: Grossly normal. S/p multiple dental extractions. Neck: Supple, no JVD, no carotid bruits, or masses. Cardiac: Irreg R&R, opening snap w/ soft murmur, no rubs, or gallops. No clubbing, cyanosis, edema.  Radials/DP/PT 2+ and equal bilaterally.  Respiratory:  Respirations regular and unlabored, clear to auscultation bilaterally. GI: Soft, nontender, nondistended, BS + x 4. MS: no deformity or atrophy. Skin: warm and dry, no rash. Neuro:  Strength and sensation are intact. Psych: AAOx3.  Normal affect.  Labs    CBC  Recent Labs  04/01/16 0212 04/02/16 0247  WBC 7.4 6.3  HGB 10.4* 9.5*  HCT 33.0* 30.4*  MCV 85.5 86.4  PLT 124* 683*   Basic Metabolic Panel  Recent Labs  03/31/16 0434 04/02/16 0247  NA 138 139  K 4.8 3.8  CL 104 106  CO2 23 22  GLUCOSE 127* 92  BUN 14 21*  CREATININE 0.97 0.92  CALCIUM 9.6 9.4   Lab Results  Component Value Date  INR 2.46 04/02/2016   INR 1.80 04/01/2016   INR 1.66 03/31/2016     Telemetry    Atrial fib, rate generally ok, no sig ectopy - Personally Reviewed  ECG    n/a - Personally Reviewed  Radiology    No results found.  Cardiac Studies   None this admit  Patient Profile     65 year old woman with moderate to severemitral stenosis following commissurotomy 2008, s/p recent R CEA presenting with recurrent syncope,with moderate to severe pulmonary HTN. Had multiple tooth extraction in anticipation of surgery and now has persistent gingival bleeding.  Assessment & Plan    1. Syncope: no recurrence since admission.   2. Moderate to severe MS: PAP appears disproportionately elevated for PAWP and she may have a component of fixed pulmonary arteriolar disease. Nevertheless, she would benefit from relief of MS. Valve appears excessively thickened and calcified for another attempt at commissurotomy/repair. Planned MV replacement in 2-3 weeks.  3.  PAFib: Resumed Warfarin. 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 Rate is controlled, no pauses, no high-rate episodes  4. S/p Multiple extraction of tooth numbers 2, 5, 6, 11, 15, 16,20-30, and 32 with alveolar bleeding. Heparin stopped. INR now therapeutic.  5. Recent R CEA  6. Second degree AV block, Mobitz type 1 when in NSR on beta blocker in current dose, but without bradycardia or lengthy pauses.  Plan: d/c when medically stable, no further cardiac workup. F/u as planned  Signed, Rosaria Ferries, PA-C  04/02/2016, 8:06 AM   Agree with note by Rosaria Ferries PA-C  Pt lethargic this AM. AFIB with CVR. INR therapeutic. Prob OK for DC home from our standpoint. Needs OP F/O with Dr. Roxy Manns 2-3 weeks.   Lorretta Harp, M.D., [place], [facility], [place] [address] [place], [place]  [address]  [phone] 04/02/2016 9:23 AM

## 2016-04-02 NOTE — Progress Notes (Signed)
CARDIAC REHAB PHASE I   PRE:  Rate/Rhythm:68 a fib  BP:  Sitting: 100/51        SaO2: 97 RA  MODE:  Ambulation: 60 ft   POST:  Rate/Rhythm: 80 a fib  BP:  Sitting: 105/55         SaO2: 98 RA  Pt states she has only been up to the bathroom the past few days, has not ambulated in hallway. Pt assisted to bathroom prior to ambulation. Pt ambulated 60 ft on RA, hand held assist, fairly steady gait, tolerated fair. Pt c/o dizziness, requested to return to room. VSS. Pt to recliner after walk, feet elevated, call bell within reach. Will follow.   [id number] Lenna Sciara, RN, BSN 04/02/2016 11:48 AM

## 2016-04-03 LAB — CBC
HEMATOCRIT: 29.5 % — AB (ref 36.0–46.0)
HEMOGLOBIN: 9.2 g/dL — AB (ref 12.0–15.0)
MCH: 26.8 pg (ref 26.0–34.0)
MCHC: 31.2 g/dL (ref 30.0–36.0)
MCV: 86 fL (ref 78.0–100.0)
PLATELETS: 123 10*3/uL — AB (ref 150–400)
RBC: 3.43 MIL/uL — AB (ref 3.87–5.11)
RDW: 16.4 % — ABNORMAL HIGH (ref 11.5–15.5)
WBC: 6.1 10*3/uL (ref 4.0–10.5)

## 2016-04-03 LAB — PROTIME-INR
INR: 2.94
Prothrombin Time: 31.3 seconds — ABNORMAL HIGH (ref 11.4–15.2)

## 2016-04-03 MED ORDER — WARFARIN SODIUM 2.5 MG PO TABS: 2.5000 mg | ORAL_TABLET | Freq: Once | ORAL | Status: DC

## 2016-04-03 MED ORDER — TRAMADOL HCL 50 MG PO TABS: 50.0000 mg | ORAL_TABLET | Freq: Four times a day (QID) | ORAL | 0 refills | Status: DC | PRN

## 2016-04-03 MED ORDER — WARFARIN SODIUM 2.5 MG PO TABS: 2.5000 mg | ORAL_TABLET | Freq: Every day | ORAL | 0 refills | Status: DC

## 2016-04-03 MED ORDER — ENSURE ENLIVE PO LIQD: 237.0000 mL | Freq: Two times a day (BID) | ORAL | 12 refills | Status: DC

## 2016-04-03 NOTE — Evaluation (Signed)
Physical Therapy Evaluation Patient Details Name: Marie Malone MRN: [id number] DOB: 1951-04-19 Today's Date: 04/03/2016   History of Present Illness  Pt is a 65 yo female admitted after an episode of unresponsiveness at home but did not lose consiousness.  Pt had a CEA on 03/10/16 and since then has now been admitted twice for these episodes.  EEG is normal and ECHO is pending.  MD seems to think it may be orthostatic.  Patient s/p cardiac cath with severe mitral stenosis/regurgitation.  Patient to be d/c for recovery period, and admitted at later time for MVR, CABG, and M[place].    Pt with h/o of mitral stenosis, pulmonary HTN and CVA.     Clinical Impression  Patient presents with problems listed below.  Will benefit from acute PT to maximize functional mobility prior to discharge home with family.  Recommend HHPT at d/c to maximize strengthening and functional mobility prior to return for surgery.  Recommend RW for balance/safety.    Follow Up Recommendations Home health PT;Supervision for mobility/OOB    Equipment Recommendations  Rolling walker with 5" wheels    Recommendations for Other Services       Precautions / Restrictions Precautions Precautions: Fall Precaution Comments: only falls during these episodes of altered consiousness Restrictions Weight Bearing Restrictions: No      Mobility  Bed Mobility Overal bed mobility: Modified Independent             General bed mobility comments: Increased time  Transfers Overall transfer level: Needs assistance Equipment used: None Transfers: Sit to/from Stand Sit to Stand: Supervision         General transfer comment: Supervision for safety  Ambulation/Gait Ambulation/Gait assistance: Min guard Ambulation Distance (Feet): 110 Feet Assistive device: None Gait Pattern/deviations: Step-through pattern;Decreased stride length;Shuffle Gait velocity: decreased Gait velocity interpretation: Below normal speed for  age/gender General Gait Details: Patient with slow, unsteady gait.  Reaching for rail in hallway.  Stairs            Wheelchair Mobility    Modified Rankin (Stroke Patients Only)       Balance Overall balance assessment: Needs assistance;History of Falls         Standing balance support: No upper extremity supported;During functional activity Standing balance-Leahy Scale: Good Standing balance comment: Unsteady during dynamic activities                             Pertinent Vitals/Pain Pain Assessment: 0-10 Pain Score: 7  Pain Location: Mouth Pain Descriptors / Indicators: Sore Pain Intervention(s): Monitored during session    Home Living Family/patient expects to be discharged to:: Private residence Living Arrangements: Spouse/significant other Available Help at Discharge: Family;Available 24 hours/day (Probably going to sister's home at d/c) Type of Home: House Home Access: Stairs to enter Entrance Stairs-Rails: None Entrance Stairs-Number of Steps: 3 Home Layout: One level Home Equipment: Cane - single point;Shower seat      Prior Function Level of Independence: Independent         Comments: Patient drives - husband does not.  She and husband cook and clean together.     Hand Dominance   Dominant Hand: Left    Extremity/Trunk Assessment   Upper Extremity Assessment: Overall WFL for tasks assessed           Lower Extremity Assessment: Generalized weakness         Communication   Communication: No difficulties  Cognition Arousal/Alertness: Awake/alert  Behavior During Therapy: WFL for tasks assessed/performed Overall Cognitive Status: History of cognitive impairments - at baseline       Memory: Decreased short-term memory              General Comments      Exercises     Assessment/Plan    PT Assessment Patient needs continued PT services  PT Problem List Decreased strength;Decreased activity  tolerance;Decreased balance;Decreased mobility;Decreased knowledge of use of DME;Cardiopulmonary status limiting activity;Pain          PT Treatment Interventions DME instruction;Gait training;Functional mobility training;Therapeutic activities;Therapeutic exercise;Balance training;Patient/family education    PT Goals (Current goals can be found in the Care Plan section)  Acute Rehab PT Goals Patient Stated Goal: To get stronger before surgery PT Goal Formulation: With patient Time For Goal Achievement: 04/10/16 Potential to Achieve Goals: Good    Frequency Min 3X/week   Barriers to discharge Decreased caregiver support Patient is going to sister's home for 24 hour assist.    Co-evaluation               End of Session Equipment Utilized During Treatment: Gait belt Activity Tolerance: Patient tolerated treatment well;Patient limited by fatigue Patient left: in bed;with call bell/phone within reach;with bed alarm set Nurse Communication: Mobility status (Recommend HHPT and RW)         Time: 1610-9604 PT Time Calculation (min) (ACUTE ONLY): 18 min   Charges:   PT Evaluation $PT Eval Moderate Complexity: 1 Procedure     PT G Codes:        Despina Pole 03-May-2016, 2:26 PM Carita Pian. Sanjuana Kava, [place] Pager [phone]

## 2016-04-03 NOTE — Discharge Summary (Signed)
Physician Discharge Summary  Marie Malone JJO:[id number] DOB: 1951-05-07 DOA: 03/21/2016  PCP: [name], NP  Admit date: 03/21/2016 Discharge date: 04/03/2016  Admitted From: Home  Disposition:  Home   Recommendations for Outpatient Follow-up:  1. Follow up with PCP in 1-2 weeks 2. Please obtain BMP/CBC in one week 3. Follow with Dr Roxy Manns for mitral valve sx.   Home Health: yes    Discharge Condition stable.  CODE STATUS: full code.  Diet recommendation: Heart Healthy  Brief/Interim Summary: 65 y.o.femalewith medical history significant for hypertension, dyslipidemia, known mitral stenosis and moderate to severe pulmonary hypertension, history of prior CVA, recent right carotid endarterectomy on 11/1, A2 fibrillation on warfarin, prior aortic valvulotomy in 2008. Patient has been having issues with persistent headache and operative site discomfort since the surgical procedure.   Assessment & Plan: 1-Near syncope, secondary to orthostatics and ? A-fib/MS related  - Has second degree AV block, Mobitz type 1 on beta blocker but without bradycardia or lengthy pauses - keep on tele for now -cardiology consulted and follwing. Stable for discharge.   Moderate (to severe?) MS - gradients are high on TEE - pt is now status post right/left cardiac cath -The left ventricular systolic function is normal. EF 55-65% by visual estimate. There is moderate mitral valve stenosis. Mean gradient 10 mm Hg. MV area 1.3 cm 2. Hemodynamic findings consistent with moderate pulmonary hypertension and mitral valve stenosis. - continue to follow up on cardiology team recommendations  - plan for MVR in several weeks per TCTS -underwent multiple teeth extractions, alveoloplasty 11-21.  PAFib - continue with coumadin  -INR at goal/  -patient will be discharge on 2.5 mg daily, need INR on Monday.   Sinus tachycardia - continue Metoprolol 25 mg PO QD  Recent R CEA - Poor postop  PO intake and maybe neurovegetative impact of carotid sinus manipulation may have contributed to recent labile BP - BP currently on low end of normal   Discharge Diagnoses:  Principal Problem:   Transient alteration of awareness Active Problems:   HTN (hypertension)   Mitral valve stenosis, rheumatic   Paroxysmal atrial fibrillation (HCC)   HLD (hyperlipidemia)   Asymptomatic stenosis of right carotid artery s/p right CEA 03/10/16   Constipation   Altered mental status   Tongue deviation   Second degree Mobitz I AV block   Syncope    Discharge Instructions     Medication List    STOP taking these medications   amLODipine 10 MG tablet Commonly known as:  NORVASC     TAKE these medications   atorvastatin 80 MG tablet Commonly known as:  LIPITOR TAKE ONE TABLET BY MOUTH ONCE DAILY AT 6 PM   docusate sodium 100 MG capsule Commonly known as:  COLACE Take 1 capsule (100 mg total) by mouth 2 (two) times daily.   feeding supplement (ENSURE ENLIVE) Liqd Take 237 mLs by mouth 2 (two) times daily between meals.   metoprolol succinate 25 MG 24 hr tablet Commonly known as:  TOPROL-XL Take 1 tablet (25 mg total) by mouth daily.   oxyCODONE-acetaminophen 5-325 MG tablet Commonly known as:  PERCOCET/ROXICET Take 1-2 tablets by mouth every 4 (four) hours as needed for moderate pain.   polyethylene glycol packet Commonly known as:  MIRALAX / GLYCOLAX Take 17 g by mouth daily.   traMADol 50 MG tablet Commonly known as:  ULTRAM Take 1 tablet (50 mg total) by mouth every 6 (six) hours as needed for moderate pain.  TYLENOL 325 MG tablet Generic drug:  acetaminophen Take 650 mg by mouth every 6 (six) hours as needed for mild pain.   warfarin 2.5 MG tablet Commonly known as:  COUMADIN Take 1 tablet (2.5 mg total) by mouth daily. Take 2.5 mg daily , you need INR check on Monday 27 What changed:  medication strength  how much to take  additional instructions             Durable Medical Equipment        Start     Ordered   04/03/16 1330  For home use only DME Walker rolling  Once    Question:  Patient needs a walker to treat with the following condition  Answer:  Transient alteration of awareness   04/03/16 1330     Follow-up Information    Rexene Alberts, MD Follow up.   Specialty:  Cardiothoracic Surgery Why:  his office will call for an appt next week for surgery Contact information: [address] [place] [address] [place] Follow up.   Why:  HHRN arranged- they will call you to set up home visits Contact information: [place] [address] [phone]          Allergies  Allergen Reactions  . No Known Allergies     Consultations:  Cardiology  CVTS    Procedures/Studies: Ct Angio Head W Or Wo Contrast  Result Date: 03/19/2016 CLINICAL DATA:  Acute onset mental status change. EXAM: CT ANGIOGRAPHY HEAD AND NECK TECHNIQUE: Multidetector CT imaging of the head and neck was performed using the standard protocol during bolus administration of intravenous contrast. Multiplanar CT image reconstructions and MIPs were obtained to evaluate the vascular anatomy. Carotid stenosis measurements (when applicable) are obtained utilizing NASCET criteria, using the distal internal carotid diameter as the denominator. CONTRAST:  50 mL Isovue 370 IV COMPARISON:  CTA head and neck 01/29/2016 FINDINGS: CT HEAD FINDINGS Brain: There is an old left cerebellar infarct. There is left MCA distribution encephalomalacia at the site of prior infarct. No CT evidence of acute cortical infarct. No acute hemorrhage. No midline shift or mass effect. No extra-axial collection. Vascular: No hyperdense vessel or unexpected calcification. Skull: Normal. Negative for fracture or focal lesion. Sinuses: Imaged portions are clear. Orbits: Normal. Review of the MIP images confirms the  above findings CTA NECK FINDINGS Aortic arch: There is atherosclerotic calcification within the aortic arch. There is a normal branching pattern. The subclavian arteries are normal proximally. There is atherosclerotic calcification within the proximal arch vessels. Right carotid system: There is mild narrowing of the right common carotid artery at the level of the thyroid secondary to mass effect from adjacent soft tissue. Otherwise, no hemodynamically significant stenosis. The right internal carotid artery is normal. Left carotid system: There is mild atherosclerotic calcification of the left carotid bifurcation without hemodynamically significant stenosis. Vertebral arteries: There is narrowing of the right vertebral artery origin secondary to atherosclerotic calcification. There is mild calcification of the left vertebral artery origin without significant stenosis. The vertebral system is right dominant. The V2, V3 and V4 segments of the vertebral arteries are normal to the confluence with the basilar artery. Skeleton: There is no bony spinal canal stenosis. No lytic or blastic lesions. Other neck: There is marked soft tissue prominence within the right carotid space, which is likely secondary to recent right carotid endarterectomy performed March 10, 2016. Upper  chest: There is a ground-glass opacity measuring 4 mm in the left upper lobe. Review of the MIP images confirms the above findings CTA HEAD FINDINGS Anterior circulation: --Intracranial internal carotid arteries: Mild calcification of the internal carotid arteries at the skullbase. --Anterior cerebral arteries: Normal. --Middle cerebral arteries: Normal. --Posterior communicating arteries: Absent bilaterally. Posterior circulation: --Posterior cerebral arteries: Normal. --Superior cerebellar arteries: Normal. --Basilar artery: Normal. --Anterior inferior cerebellar arteries: Not visualized, which is not uncommon. --Posterior inferior cerebellar  arteries: Normal. Venous sinuses: As permitted by contrast timing, patent. Anatomic variants: None. Delayed phase: No abnormal parenchymal enhancement. Review of the MIP images confirms the above findings IMPRESSION: 1. No acute intracranial abnormality. 2. 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 3. Normal CTA of the circle of Willis and intracranial arteries. 4. Status post right carotid endarterectomy with greatly improved patency. 5. No hemodynamically significant stenosis of the left carotid system. Electronically Signed   By: Ulyses Jarred M.D.   On: 03/19/2016 06:27   Dg Orthopantogram  Result Date: 03/26/2016 CLINICAL DATA:  Pt states she is a ? Pre op CABG, had recent carotid surgery, kept passing out after, and is now a candidate for a CABG, all of her teeth hurt,and her bottom teeth are loose. Pt states no chest complaints. Hx of HTN. EXAM: ORTHOPANTOGRAM/PANORAMIC COMPARISON:  None. FINDINGS: No fracture.  No bone lesion. There are multiple missing maxillary and mandibular teeth, with all molars missing from the left mandible and 1 missing molar from the right mandible. There is periapical lucency at the roots of the left canine and premolar mandibular teeth, and probably also from the right mandibular premolar, less well-visualized. IMPRESSION: 1. No fracture or bone lesion. 2. Dental disease with mild periapical lucency involving several mandibular teeth. There multiple missing teeth. Electronically Signed   By: Lajean Manes M.D.   On: 03/26/2016 13:26   Dg Chest 2 View  Result Date: 03/26/2016 CLINICAL DATA:  Coronary artery disease EXAM: CHEST  2 VIEW COMPARISON:  Oct 07, 2015 FINDINGS: There is no edema or consolidation. Heart size and pulmonary vascularity are normal. No adenopathy. There is atherosclerotic calcification in the aorta. No bone lesions are evident. IMPRESSION: No edema or consolidation.  There is aortic atherosclerosis. Electronically Signed   By: Lowella Grip III M.D.   On: 03/26/2016 13:26   Dg Abd 1 View  Result Date: 03/19/2016 CLINICAL DATA:  Constipation. EXAM: ABDOMEN - 1 VIEW COMPARISON:  No recent prior. FINDINGS: Soft tissue structures are unremarkable. Ill-defined radiopacity is noted stomach, this may represent debris within the stomach. Stool noted throughout colon suggests constipation. No bowel distention. No free air. Degenerative changes lumbar spine and both hips. Aortoiliac atherosclerotic vascular calcification. Surgical clips sutures in the pelvis. IMPRESSION: No acute abnormality identified. Prominent amount of stool in the colon suggesting constipation. Electronically Signed   By: Marcello Moores  Register   On: 03/19/2016 09:56   Ct Head Wo Contrast  Result Date: 03/21/2016 CLINICAL DATA:  Frontal and right-sided headache after dizziness and fall. Right-sided neck pain. Carotid endarterectomy March 10, 2016 EXAM: CT HEAD WITHOUT CONTRAST CT CERVICAL SPINE WITHOUT CONTRAST TECHNIQUE: Multidetector CT imaging of the head and cervical spine was performed following the standard protocol without intravenous contrast. Multiplanar CT image reconstructions of the cervical spine were also generated. COMPARISON:  March 19, 2016 CT scan FINDINGS: CT HEAD FINDINGS Brain: No subdural, epidural, or subarachnoid hemorrhage. Small infarct in the left cerebellar hemisphere, unchanged. Left parietal infarct with encephalomalacia,  unchanged. No acute cortical ischemia or infarct. Ventricles and sulci are stable. Brainstem and basal cisterns are normal. No mass, mass effect, or midline shift. Vascular: Calcified atherosclerosis in the intracranial portions of the carotid arteries. Skull: Normal. Negative for fracture or focal lesion. Sinuses/Orbits: No acute finding. Other: None. CT CERVICAL SPINE FINDINGS Alignment: Straightening of normal lordosis with no traumatic malalignment. Skull base and vertebrae: No fractures. Soft tissues and spinal canal:  Hematoma in the right side of the neck from recent right carotid endarterectomy exerts mass effect on the right thyroid lobe. The CTA of the neck from March 19, 2016 demonstrated mild narrowing of the carotid artery as it passed posterior to the hematoma. The carotid artery is not well assessed in this region today. The hematoma is similar in size today. No other acute soft tissue abnormalities. Disc levels: Multilevel degenerative changes with anterior and posterior osteophytes. Partial calcification of the posterior longitudinal ligament. The posterior osteophytes are most prominent at T1-2 and T2-3 with narrowing of the spinal canal at these levels. At T1-2, the spinal canal measures 7 mm in maximum diameter. Upper chest: Negative. Other: No other abnormalities. IMPRESSION: 1. No acute intracranial process. Left cerebellar and left parietal infarcts, chronic. 2. No fracture or traumatic malalignment in the cervical spine. 3. Degenerative changes. Partial calcification of the posterior longitudinal ligament. Narrowing of the spinal canal due to these findings, most marked at T1-2 and T2-3 with an AP diameter of the spinal canal at T1-2 of 7 mm. 4. Hematoma in the right side of the neck from recent surgery. Electronically Signed   By: Dorise Bullion III M.D   On: 03/21/2016 09:50   Ct Angio Neck W And/or Wo Contrast  Result Date: 03/19/2016 CLINICAL DATA:  Acute onset mental status change. EXAM: CT ANGIOGRAPHY HEAD AND NECK TECHNIQUE: Multidetector CT imaging of the head and neck was performed using the standard protocol during bolus administration of intravenous contrast. Multiplanar CT image reconstructions and MIPs were obtained to evaluate the vascular anatomy. Carotid stenosis measurements (when applicable) are obtained utilizing NASCET criteria, using the distal internal carotid diameter as the denominator. CONTRAST:  50 mL Isovue 370 IV COMPARISON:  CTA head and neck 01/29/2016 FINDINGS: CT HEAD  FINDINGS Brain: There is an old left cerebellar infarct. There is left MCA distribution encephalomalacia at the site of prior infarct. No CT evidence of acute cortical infarct. No acute hemorrhage. No midline shift or mass effect. No extra-axial collection. Vascular: No hyperdense vessel or unexpected calcification. Skull: Normal. Negative for fracture or focal lesion. Sinuses: Imaged portions are clear. Orbits: Normal. Review of the MIP images confirms the above findings CTA NECK FINDINGS Aortic arch: There is atherosclerotic calcification within the aortic arch. There is a normal branching pattern. The subclavian arteries are normal proximally. There is atherosclerotic calcification within the proximal arch vessels. Right carotid system: There is mild narrowing of the right common carotid artery at the level of the thyroid secondary to mass effect from adjacent soft tissue. Otherwise, no hemodynamically significant stenosis. The right internal carotid artery is normal. Left carotid system: There is mild atherosclerotic calcification of the left carotid bifurcation without hemodynamically significant stenosis. Vertebral arteries: There is narrowing of the right vertebral artery origin secondary to atherosclerotic calcification. There is mild calcification of the left vertebral artery origin without significant stenosis. The vertebral system is right dominant. The V2, V3 and V4 segments of the vertebral arteries are normal to the confluence with the basilar artery. Skeleton: There is  no bony spinal canal stenosis. No lytic or blastic lesions. Other neck: There is marked soft tissue prominence within the right carotid space, which is likely secondary to recent right carotid endarterectomy performed March 10, 2016. Upper chest: There is a ground-glass opacity measuring 4 mm in the left upper lobe. Review of the MIP images confirms the above findings CTA HEAD FINDINGS Anterior circulation: --Intracranial internal  carotid arteries: Mild calcification of the internal carotid arteries at the skullbase. --Anterior cerebral arteries: Normal. --Middle cerebral arteries: Normal. --Posterior communicating arteries: Absent bilaterally. Posterior circulation: --Posterior cerebral arteries: Normal. --Superior cerebellar arteries: Normal. --Basilar artery: Normal. --Anterior inferior cerebellar arteries: Not visualized, which is not uncommon. --Posterior inferior cerebellar arteries: Normal. Venous sinuses: As permitted by contrast timing, patent. Anatomic variants: None. Delayed phase: No abnormal parenchymal enhancement. Review of the MIP images confirms the above findings IMPRESSION: 1. No acute intracranial abnormality. 2. 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 3. Normal CTA of the circle of Willis and intracranial arteries. 4. Status post right carotid endarterectomy with greatly improved patency. 5. No hemodynamically significant stenosis of the left carotid system. Electronically Signed   By: Ulyses Jarred M.D.   On: 03/19/2016 06:27   Ct Cervical Spine Wo Contrast  Result Date: 03/21/2016 CLINICAL DATA:  Frontal and right-sided headache after dizziness and fall. Right-sided neck pain. Carotid endarterectomy March 10, 2016 EXAM: CT HEAD WITHOUT CONTRAST CT CERVICAL SPINE WITHOUT CONTRAST TECHNIQUE: Multidetector CT imaging of the head and cervical spine was performed following the standard protocol without intravenous contrast. Multiplanar CT image reconstructions of the cervical spine were also generated. COMPARISON:  March 19, 2016 CT scan FINDINGS: CT HEAD FINDINGS Brain: No subdural, epidural, or subarachnoid hemorrhage. Small infarct in the left cerebellar hemisphere, unchanged. Left parietal infarct with encephalomalacia, unchanged. No acute cortical ischemia or infarct. Ventricles and sulci are stable. Brainstem and basal cisterns are normal. No mass, mass effect, or midline shift. Vascular:  Calcified atherosclerosis in the intracranial portions of the carotid arteries. Skull: Normal. Negative for fracture or focal lesion. Sinuses/Orbits: No acute finding. Other: None. CT CERVICAL SPINE FINDINGS Alignment: Straightening of normal lordosis with no traumatic malalignment. Skull base and vertebrae: No fractures. Soft tissues and spinal canal: Hematoma in the right side of the neck from recent right carotid endarterectomy exerts mass effect on the right thyroid lobe. The CTA of the neck from March 19, 2016 demonstrated mild narrowing of the carotid artery as it passed posterior to the hematoma. The carotid artery is not well assessed in this region today. The hematoma is similar in size today. No other acute soft tissue abnormalities. Disc levels: Multilevel degenerative changes with anterior and posterior osteophytes. Partial calcification of the posterior longitudinal ligament. The posterior osteophytes are most prominent at T1-2 and T2-3 with narrowing of the spinal canal at these levels. At T1-2, the spinal canal measures 7 mm in maximum diameter. Upper chest: Negative. Other: No other abnormalities. IMPRESSION: 1. No acute intracranial process. Left cerebellar and left parietal infarcts, chronic. 2. No fracture or traumatic malalignment in the cervical spine. 3. Degenerative changes. Partial calcification of the posterior longitudinal ligament. Narrowing of the spinal canal due to these findings, most marked at T1-2 and T2-3 with an AP diameter of the spinal canal at T1-2 of 7 mm. 4. Hematoma in the right side of the neck from recent surgery. Electronically Signed   By: Dorise Bullion III M.D   On: 03/21/2016 09:50   Mr Brain Wo Contrast  Result Date: 03/22/2016 CLINICAL DATA:  New tongue deviation to the right. Prior left MCA stroke. Multiple recent syncopal episodes. EXAM: MRI HEAD WITHOUT CONTRAST TECHNIQUE: Multiplanar, multiecho pulse sequences of the brain and surrounding structures were  obtained without intravenous contrast. COMPARISON:  Head CT 03/21/2016 and MRI 04/06/2013 FINDINGS: Brain: There is no evidence of acute infarct, mass, midline shift, or extra-axial fluid collection. A chronic left MCA infarct is again seen per my early involving the parietal lobe with a small amount of chronic blood products. There is ex vacuo dilatation of the left lateral ventricle. Small focus of cortical encephalomalacia at the level of the posterior right insula is unchanged. Small foci of scattered T2 hyperintensity in both cerebral hemispheres are similar to the prior MRI and nonspecific but compatible with mild chronic small vessel ischemic disease. Small chronic infarcts are again seen in the basal ganglia bilaterally and left cerebellum. Vascular: Major intracranial vascular flow voids are preserved. Skull and upper cervical spine: Skull hyperostosis. No suspicious osseous lesion. Sinuses/Orbits: Unremarkable orbits. Trace left mastoid effusion. No significant paranasal sinus disease. Other: None. IMPRESSION: 1. No acute intracranial abnormality. 2. Chronic left MCA infarct. 3. Chronic small vessel ischemic changes as above. Electronically Signed   By: Logan Bores M.D.   On: 03/22/2016 13:02      Subjective: Feeling ok, no further bleeding.   Discharge Exam: Vitals:   04/03/16 0619 04/03/16 1324  BP: 134/66 113/90  Pulse: 94 95  Resp: 18 19  Temp: 98.2 F (36.8 C) 98.2 F (36.8 C)   Vitals:   04/02/16 1334 04/02/16 2004 04/03/16 0619 04/03/16 1324  BP: (!) 116/54 (!) 114/52 134/66 113/90  Pulse: 69 71 94 95  Resp: 18 18 18 19   Temp: 98.1 F (36.7 C) 98.1 F (36.7 C) 98.2 F (36.8 C) 98.2 F (36.8 C)  TempSrc: Oral Oral Oral Oral  SpO2: 100% 99% 97%   Weight:      Height:        General: Pt is alert, awake, not in acute distress Cardiovascular: RRR, S1/S2 +, no rubs, no gallops Respiratory: CTA bilaterally, no wheezing, no rhonchi Abdominal: Soft, NT, ND, bowel sounds  + Extremities: no edema, no cyanosis    The results of significant diagnostics from this hospitalization (including imaging, microbiology, ancillary and laboratory) are listed below for reference.     Microbiology: No results found for this or any previous visit (from the past 240 hour(s)).   Labs: BNP (last 3 results) No results for input(s): BNP in the last 8760 hours. Basic Metabolic Panel:  Recent Labs Lab 03/28/16 0336 03/29/16 0239 03/31/16 0434 04/02/16 0247  NA 140 139 138 139  K 3.8 3.8 4.8 3.8  CL 109 110 104 106  CO2 20* 21* 23 22  GLUCOSE 93 92 127* 92  BUN 13 13 14  21*  CREATININE 0.82 0.83 0.97 0.92  CALCIUM 9.6 9.4 9.6 9.4   Liver Function Tests: No results for input(s): AST, ALT, ALKPHOS, BILITOT, PROT, ALBUMIN in the last 168 hours. No results for input(s): LIPASE, AMYLASE in the last 168 hours. No results for input(s): AMMONIA in the last 168 hours. CBC:  Recent Labs Lab 03/29/16 0239 03/31/16 0434 04/01/16 0212 04/02/16 0247 04/03/16 0136  WBC 4.0 8.3 7.4 6.3 6.1  HGB 10.9* 10.9* 10.4* 9.5* 9.2*  HCT 35.4* 34.7* 33.0* 30.4* 29.5*  MCV 85.3 85.9 85.5 86.4 86.0  PLT 139* 147* 124* 115* 123*   Cardiac Enzymes: No results for input(s):  CKTOTAL, CKMB, CKMBINDEX, TROPONINI in the last 168 hours. BNP: Invalid input(s): POCBNP CBG: No results for input(s): GLUCAP in the last 168 hours. D-Dimer No results for input(s): DDIMER in the last 72 hours. Hgb A1c No results for input(s): HGBA1C in the last 72 hours. Lipid Profile No results for input(s): CHOL, HDL, LDLCALC, TRIG, CHOLHDL, LDLDIRECT in the last 72 hours. Thyroid function studies No results for input(s): TSH, T4TOTAL, T3FREE, THYROIDAB in the last 72 hours.  Invalid input(s): FREET3 Anemia work up No results for input(s): VITAMINB12, FOLATE, FERRITIN, TIBC, IRON, RETICCTPCT in the last 72 hours. Urinalysis    Component Value Date/Time   COLORURINE YELLOW 03/26/2016 2019    APPEARANCEUR CLEAR 03/26/2016 2019   LABSPEC 1.015 03/26/2016 2019   PHURINE 6.5 03/26/2016 2019   GLUCOSEU NEGATIVE 03/26/2016 2019   GLUCOSEU NEGATIVE 07/22/2015 [place] NEGATIVE 03/26/2016 2019   BILIRUBINUR NEGATIVE 03/26/2016 2019   KETONESUR NEGATIVE 03/26/2016 2019   PROTEINUR NEGATIVE 03/26/2016 2019   UROBILINOGEN 0.2 07/22/2015 1444   NITRITE NEGATIVE 03/26/2016 2019   LEUKOCYTESUR NEGATIVE 03/26/2016 2019   Sepsis Labs Invalid input(s): PROCALCITONIN,  WBC,  LACTICIDVEN Microbiology No results found for this or any previous visit (from the past 240 hour(s)).   Time coordinating discharge: Over 30 minutes  SIGNED:   Elmarie Shiley, MD  [facility] 04/03/2016, 2:34 PM Pager   If 7PM-7AM, please contact night-coverage [email or web address] Password [email or web address]

## 2016-04-03 NOTE — Progress Notes (Addendum)
CM notes that [facility] for PT/Aide/RN already setup with [facility] and orders in. CM called patient to confirm and she agreed. RW ordered to the bedside with [facility]. CM called Jermaine with [facility] to advise of HH needs and INR Checks to begin in 24-48 hours and he said someone would see the patient at home tomorrow. Results to be called to PCP Marie Malone. Patient states that she is familiar with coumadin and has had INR checked before so understanding importance of medication compliance and INR monitoring.

## 2016-04-03 NOTE — Progress Notes (Signed)
ANTICOAGULATION CONSULT NOTE - Follow Up Consult  Pharmacy Consult:  Coumadin Indication: atrial fibrillation  Allergies  Allergen Reactions  . No Known Allergies     Patient Measurements: Height: 5\' 2"  (157.5 cm) Weight: 158 lb 9.6 oz (71.9 kg) IBW/kg (Calculated) : 50.1  Vital Signs: Temp: 98.2 F (36.8 C) (11/25 0619) Temp Source: Oral (11/25 0619) BP: 134/66 (11/25 0619) Pulse Rate: 94 (11/25 0619)  Labs:  Recent Labs  03/31/16 2049  04/01/16 0212 04/02/16 0247 04/03/16 0136  HGB  --   < > 10.4* 9.5* 9.2*  HCT  --   --  33.0* 30.4* 29.5*  PLT  --   --  124* 115* 123*  LABPROT  --   --  21.1* 27.2* 31.3*  INR  --   --  1.80 2.46 2.94  HEPARINUNFRC 0.51  --  0.28*  --   --   CREATININE  --   --   --  0.92  --   < > = values in this interval not displayed.  Estimated Creatinine Clearance: 57.3 mL/min (by C-G formula based on SCr of 0.92 mg/dL).     Assessment: Marie Malone with history of Afib on Coumadin PTA.  She is s/p cath on 03/25/16 and teeth extractions on 03/30/16.  Patient had severe bleeding with clots; therefore heparin was discontinued on 11/23.  Continuing coumadin. -INR= 2.94 with trend up  Goal of Therapy:  INR 2 - 3    Plan:  - Coumadin 2.5mg  PO today - Daily PT / INR  Hildred Laser, Pharm D 04/03/2016 10:25 AM

## 2016-04-03 NOTE — Progress Notes (Signed)
Order to discharge received.  IV and telemetry removed, CCMD notified.  Pt has been consulted by care mgt and has received front wheel walker for home use.

## 2016-04-03 NOTE — Progress Notes (Addendum)
CARDIAC REHAB PHASE I   PRE:  Rate/Rhythm: 75 SR  BP:   Sitting: 129/61     SaO2: 96% RA  MODE:  Ambulation: 136 ft   POST:  Rate/Rhythm: 88 SR  BP:   Sitting: 140/56     SaO2: 99% RA   1523-1555  Pt ambulated 102 ft with one person A, gait belt and rolling walker and walk 34 ft without walker, but with SBA and gait belt. Pt balance and gait is unsteady and encouraged using walker at home for stability.Pt given rolling walker for home use. Pt voiced understanding. Returned pt to bed side with VSS. Call be in reach.  Demetre Monaco D Kendrah Lovern,MS,ACSM-RCEP 04/03/2016 3:54 PM

## 2016-04-05 ENCOUNTER — Encounter: Payer: BC Managed Care – PPO | Admitting: Surgery

## 2016-04-05 ENCOUNTER — Telehealth (HOSPITAL_COMMUNITY): Payer: Self-pay | Admitting: Dentistry

## 2016-04-05 ENCOUNTER — Telehealth: Payer: Self-pay

## 2016-04-05 ENCOUNTER — Ambulatory Visit (INDEPENDENT_AMBULATORY_CARE_PROVIDER_SITE_OTHER): Payer: BC Managed Care – PPO | Admitting: Pharmacist Clinician (PhC)/ Clinical Pharmacy Specialist

## 2016-04-05 DIAGNOSIS — I480 Paroxysmal atrial fibrillation: Secondary | ICD-10-CM

## 2016-04-05 LAB — POCT INR: INR: 2.3

## 2016-04-05 NOTE — Telephone Encounter (Signed)
Amy is calling to give results for INR 2.3 proton 27.3

## 2016-04-05 NOTE — Telephone Encounter (Signed)
INR confirmed with RN at 2.3.  See anticoag note

## 2016-04-05 NOTE — Telephone Encounter (Signed)
11/[birth year]/63F/U call to pt. after surgical dental treatment.  Patient doing well. F/U appt. scheduled w/Dr. Enrique Sack on 04/12/16 @ 10:15.  [id number]

## 2016-04-05 NOTE — Telephone Encounter (Signed)
Message sent to coumadin clinic.

## 2016-04-12 ENCOUNTER — Ambulatory Visit (INDEPENDENT_AMBULATORY_CARE_PROVIDER_SITE_OTHER): Payer: BC Managed Care – PPO | Admitting: Pharmacist Clinician (PhC)/ Clinical Pharmacy Specialist

## 2016-04-12 ENCOUNTER — Ambulatory Visit (HOSPITAL_COMMUNITY): Payer: Self-pay | Admitting: Dentistry

## 2016-04-12 ENCOUNTER — Encounter (HOSPITAL_COMMUNITY): Payer: Self-pay | Admitting: Dentistry

## 2016-04-12 VITALS — BP 143/60 | HR 73 | Temp 98.3°F

## 2016-04-12 DIAGNOSIS — K0820 Unspecified atrophy of edentulous alveolar ridge: Secondary | ICD-10-CM

## 2016-04-12 DIAGNOSIS — I480 Paroxysmal atrial fibrillation: Secondary | ICD-10-CM

## 2016-04-12 DIAGNOSIS — I050 Rheumatic mitral stenosis: Secondary | ICD-10-CM

## 2016-04-12 DIAGNOSIS — K08109 Complete loss of teeth, unspecified cause, unspecified class: Secondary | ICD-10-CM

## 2016-04-12 DIAGNOSIS — K08199 Complete loss of teeth due to other specified cause, unspecified class: Secondary | ICD-10-CM

## 2016-04-12 LAB — POCT INR: INR: 1.3

## 2016-04-12 MED ORDER — CHLORHEXIDINE GLUCONATE 0.12 % MT SOLN: OROMUCOSAL | 99 refills | Status: DC

## 2016-04-12 NOTE — Progress Notes (Signed)
POST OPERATIVE NOTE:  04/12/2016 Marie Malone [id number]  VITALS: BP (!) 143/60 (BP Location: Left Arm)   Pulse 73   Temp 98.3 F (36.8 C) (Oral)   LABS:  Lab Results  Component Value Date   WBC 6.1 04/03/2016   HGB 9.2 (L) 04/03/2016   HCT 29.5 (L) 04/03/2016   MCV 86.0 04/03/2016   PLT 123 (L) 04/03/2016   BMET    Component Value Date/Time   NA 139 04/02/2016 0247   K 3.8 04/02/2016 0247   CL 106 04/02/2016 0247   CO2 22 04/02/2016 0247   GLUCOSE 92 04/02/2016 0247   BUN 21 (H) 04/02/2016 0247   CREATININE 0.92 04/02/2016 0247   CREATININE 0.98 01/30/2016 1410   CALCIUM 9.4 04/02/2016 0247   GFRNONAA >60 04/02/2016 0247   GFRNONAA 64 12/17/2013 1607   GFRAA >60 04/02/2016 0247   GFRAA 74 12/17/2013 1607    Lab Results  Component Value Date   INR 2.3 04/05/2016   INR 2.94 04/03/2016   INR 2.46 04/02/2016   No results found for: PTT   Marie Malone is status post Extraction of remaining teeth with alveoloplasty and pre-prosthetic surgery as needed the operating room on 03/30/2016. Patient now presents for evaluation of healing and suture removal as needed.  SUBJECTIVE: Patient with minimal discomfort from extraction sites. Patient denies any active bleeding at this time. Patient indicates that several stitches remain.  EXAM: There is no sign of infection, heme, or ooze. Sutures are loosely intact. Patient is healing in by generalized primary closure.  PROCEDURE: The patient was given a chlorhexidine gluconate rinse for 30 seconds. Sutures were then removed without complication. Patient tolerated the procedure well.  ASSESSMENT: Post operative course is consistent with dental procedures performed in the operating room with general anesthesia. Patient is edentulous. There is atrophy of the edentulous alveolar ridges.  PLAN: 1. Use chlorhexidine gluconate rinses as prescribed. Patient is to rinse with 15 ML's twice daily after breakfast and at  bedtime. Patient is to use in a swish and spit manner. Prescription was sent to her pharmacy with when necessary refills. 2. Current soft diet. Patient is to advance diet as tolerated over the next several weeks. 3. Patient is to follow up with the dentist of her choice for fabrication of upper lower complete dentures in approximately 1-2 months and once medically stable from her anticipated heart valve surgery. 4. Patient is cleared for heart valve surgery at this time with Dr. Roxy Manns.   Lenn Cal, DDS

## 2016-04-12 NOTE — Patient Instructions (Signed)
PLAN: 1. Use chlorhexidine gluconate rinses as prescribed. Patient is to rinse with 15 ML's twice daily after breakfast and at bedtime. Patient is to use in a swish and spit manner. Prescription was sent to her pharmacy with when necessary refills. 2. Current soft diet. Patient is to advance diet as tolerated over the next several weeks. 3. Patient is to follow up with the dentist of her choice for fabrication of upper lower complete dentures in approximately 1-2 months and once medically stable from her anticipated heart valve surgery. 4. Patient is cleared for heart valve surgery at this time with Dr. Roxy Manns.   Lenn Cal, DDS

## 2016-04-15 NOTE — Progress Notes (Signed)
HPI: FU MS and atrial fibrillation. Patient has a history of rheumatic fever/MS. Patient had valvuloplasty at [facility] in 2008. Abdominal ultrasound in April of 2014 normal. Prior ILR revealed paroxysmal atrial fibrillation and anticoagulation was initiated. Patient had carotid endarterectomy November 2017. Admitted with syncope that same month. Had Mobitz type I second-degree AV block when in sinus rhythm but rate controlled when in atrial fibrillation. CTA November 2017 following CEA showed prior right carotid endarterectomy and no hemodynamically significant stenosis of the left carotid. TEE November 2017 showed normal LV function, moderate to severe mitral stenosis, mild left atrial enlargement. Cardiac catheterization November 2017 showed normal LV systolic function, moderate mitral stenosis with mean gradient 10 mmHg, 100% right coronary artery with left-to-right collaterals and moderate pulmonary hypertension. Plan ultimately is for mitral valve replacement, Maze and CABG.  Since last seen, patient denies chest pain, dyspnea or recurrent syncope.  Current Outpatient Prescriptions  Medication Sig Dispense Refill  . acetaminophen (TYLENOL) 325 MG tablet Take 650 mg by mouth every 6 (six) hours as needed for mild pain.     [name] Kitchen atorvastatin (LIPITOR) 80 MG tablet TAKE ONE TABLET BY MOUTH ONCE DAILY AT 6 PM 30 tablet 5  . chlorhexidine (PERIDEX) 0.12 % solution Rinse with 15 mLs bid after breakfast and at bedtime.  Use in a swish and spit manner. 480 mL prn  . docusate sodium (COLACE) 100 MG capsule Take 1 capsule (100 mg total) by mouth 2 (two) times daily. (Patient taking differently: Take 100 mg by mouth as directed. ) 10 capsule 0  . feeding supplement, ENSURE ENLIVE, (ENSURE ENLIVE) LIQD Take 237 mLs by mouth 2 (two) times daily between meals. 237 mL 12  . metoprolol succinate (TOPROL-XL) 25 MG 24 hr tablet Take 1 tablet (25 mg total) by mouth daily. 30 tablet 5  . polyethylene glycol (MIRALAX /  GLYCOLAX) packet Take 17 g by mouth daily. 14 each 0  . warfarin (COUMADIN) 2.5 MG tablet Take 1 tablet (2.5 mg total) by mouth daily. Take 2.5 mg daily , you need INR check on Monday 27 30 tablet 0   No current facility-administered medications for this visit.      Past Medical History:  Diagnosis Date  . Atrial fibrillation ([place])   . Complication of anesthesia    difficult to wake up from anesthesia  . Heart murmur   . History of rheumatic fever    as a child  . Hyperlipidemia   . Hypertension   . Mitral stenosis   . OSA (obstructive sleep apnea)    mild per sleep study 2008  . Paroxysmal atrial fibrillation ([facility])   . Pneumonia    double pneumonia once  . Stroke [facility])    x2    Past Surgical History:  Procedure Laterality Date  . APPENDECTOMY  1989   appendix ruptured,had peritonitis  . BALLOON VALVULOPLASTY  2008   [facility]  . CARDIAC CATHETERIZATION N/A 03/25/2016   Procedure: Right/Left Heart Cath and Coronary Angiography;  Surgeon: Jettie Booze, MD;  Location: [place] [facility];  Service: Cardiovascular;  Laterality: N/A;  . COLONOSCOPY W/ POLYPECTOMY    . ENDARTERECTOMY Right 03/10/2016   Procedure: ENDARTERECTOMY CAROTID RIGHT;  Surgeon: Serafina Mitchell, MD;  Location: [place];  Service: Vascular;  Laterality: Right;  . EP IMPLANTABLE DEVICE N/A 03/11/2016   Procedure: Loop Recorder Removal;  Surgeon: Deboraha Sprang, MD;  Location: [place] [facility];  Service: Cardiovascular;  Laterality: N/A;  .  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 LOOP RECORDER IMPLANT N/A 04/10/2013   Procedure: LOOP RECORDER IMPLANT;  Surgeon: Coralyn Mark, MD;  Location: [place] CATH LAB;  Service: Cardiovascular;  Laterality: N/A;  . MULTIPLE EXTRACTIONS WITH ALVEOLOPLASTY N/A 03/30/2016   Procedure: EXTRACTION OF TOOTH #'S 2,5,6,11,15,16,20-30 AND 32 WITH ALVEOLOPLASTY AND BILATERAL MAXILLARY LATERAL EXOSTOSES REDUCTIONS;  Surgeon: Lenn Cal, DDS;  Location: [place];  Service: Oral Surgery;  Laterality: N/A;  . PATCH ANGIOPLASTY Right 03/10/2016   Procedure: PATCH ANGIOPLASTY CAROTID RIGHT USING Rueben Bash BIOLOGIC PATCH;  Surgeon: Serafina Mitchell, MD;  Location: [place];  Service: Vascular;  Laterality: Right;  . TEE WITHOUT CARDIOVERSION N/A 04/10/2013   Procedure: TRANSESOPHAGEAL ECHOCARDIOGRAM (TEE);  Surgeon: [name], MD;  Location: [facility] ENDOSCOPY;  Service: Cardiovascular;  Laterality: N/A;  . TEE WITHOUT CARDIOVERSION N/A 03/24/2016   Procedure: TRANSESOPHAGEAL ECHOCARDIOGRAM (TEE);  Surgeon: Jerline Pain, MD;  Location: [place];  Service: Cardiovascular;  Laterality: N/A;  . TUBAL LIGATION      Social History   Social History  . Marital status: Married    Spouse name: N/A  . Number of children: 2  . Years of education: Associates   Occupational History  . Not on file.   Social History Main Topics  . Smoking status: Former Smoker    Packs/day: 1.00    Years: 5.00    Types: Cigarettes    Start date: 07/15/1970    Quit date: 05/11/1975  . Smokeless tobacco: Never Used     Comment: quit smoking 36 years ago  . Alcohol use No     Comment: drank years ago when young  . Drug use: No  . Sexual activity: Not on file   Other Topics Concern  . Not on file   Social History Narrative   Retired Archivist   Married   She has 2 grown children-   Daughter lives in [place]   Son lives in [place]   6 grandchildren        Family History  Problem Relation Age of Onset  . Diabetes Mother   . Stroke Mother   . Diabetes Father   . Heart disease Father     CHF  . Cancer Father     kidney  . Diabetes Sister   . Diabetes Brother   . Hypertension Daughter     ROS: no fevers or chills, productive cough, hemoptysis, dysphasia, odynophagia, melena, hematochezia, dysuria, hematuria, rash, seizure activity, orthopnea, PND, pedal edema, claudication. Remaining systems are  negative.  Physical Exam: Well-developed well-nourished in no acute distress.  Skin is warm and dry.  HEENT is normal.  Neck is supple.  Chest is clear to auscultation with normal expansion.  Cardiovascular exam is regular rate and rhythm.  Abdominal exam nontender or distended. No masses palpated. Extremities show no edema. neuro grossly intact  A/P  1 Paroxysmal atrial fibrillation/flutter-continue metoprolol. Continue Coumadin. For Maze at time of MVR.  2 Carotid artery disease-s/p CEA; continue statin. No aspirin given need for anticoagulation.   3 hypertension-blood pressure controlled. Continue present medications.  4 history of mitral stenosis/balloon valvuloplasty-recent evaluation shows moderate to severe mitral stenosis. She has had her teeth extracted in anticipation of mitral valve replacement. She will see Dr. Roxy Manns back next week to schedule her procedure.  5 Hyperlipidemia-continue statin.  6 CAD-for CABG at time of mitral valve replacement.  Aaron Edelman  [name], MD

## 2016-04-19 ENCOUNTER — Ambulatory Visit (INDEPENDENT_AMBULATORY_CARE_PROVIDER_SITE_OTHER): Payer: BC Managed Care – PPO | Admitting: Pharmacist Clinician (PhC)/ Clinical Pharmacy Specialist

## 2016-04-19 DIAGNOSIS — I480 Paroxysmal atrial fibrillation: Secondary | ICD-10-CM

## 2016-04-19 LAB — POCT INR: INR: 1.3

## 2016-04-21 ENCOUNTER — Ambulatory Visit (INDEPENDENT_AMBULATORY_CARE_PROVIDER_SITE_OTHER): Payer: BC Managed Care – PPO | Admitting: Cardiology

## 2016-04-21 ENCOUNTER — Encounter: Payer: Self-pay | Admitting: Cardiology

## 2016-04-21 VITALS — BP 124/85 | HR 77 | Ht 62.0 in | Wt 158.4 lb

## 2016-04-21 DIAGNOSIS — I059 Rheumatic mitral valve disease, unspecified: Secondary | ICD-10-CM

## 2016-04-21 DIAGNOSIS — I10 Essential (primary) hypertension: Secondary | ICD-10-CM

## 2016-04-21 DIAGNOSIS — E7800 Pure hypercholesterolemia, unspecified: Secondary | ICD-10-CM

## 2016-04-21 DIAGNOSIS — I480 Paroxysmal atrial fibrillation: Secondary | ICD-10-CM

## 2016-04-21 NOTE — Patient Instructions (Signed)
Your physician recommends that you schedule a follow-up appointment in: 3 MONTHS WITH DR CRENSHAW  

## 2016-04-26 ENCOUNTER — Encounter: Payer: Self-pay | Admitting: Thoracic Surgery (Cardiothoracic Vascular Surgery)

## 2016-04-26 ENCOUNTER — Ambulatory Visit (INDEPENDENT_AMBULATORY_CARE_PROVIDER_SITE_OTHER): Payer: BC Managed Care – PPO | Admitting: Thoracic Surgery (Cardiothoracic Vascular Surgery)

## 2016-04-26 ENCOUNTER — Ambulatory Visit (INDEPENDENT_AMBULATORY_CARE_PROVIDER_SITE_OTHER): Payer: BC Managed Care – PPO | Admitting: Pharmacist Clinician (PhC)/ Clinical Pharmacy Specialist

## 2016-04-26 VITALS — BP 140/73 | HR 74 | Resp 20 | Ht 62.0 in | Wt 158.0 lb

## 2016-04-26 DIAGNOSIS — I050 Rheumatic mitral stenosis: Secondary | ICD-10-CM | POA: Diagnosis not present

## 2016-04-26 DIAGNOSIS — Z9889 Other specified postprocedural states: Secondary | ICD-10-CM

## 2016-04-26 DIAGNOSIS — I2510 Atherosclerotic heart disease of native coronary artery without angina pectoris: Secondary | ICD-10-CM

## 2016-04-26 DIAGNOSIS — I482 Chronic atrial fibrillation, unspecified: Secondary | ICD-10-CM

## 2016-04-26 DIAGNOSIS — I480 Paroxysmal atrial fibrillation: Secondary | ICD-10-CM

## 2016-04-26 DIAGNOSIS — I272 Pulmonary hypertension, unspecified: Secondary | ICD-10-CM

## 2016-04-26 DIAGNOSIS — I5032 Chronic diastolic (congestive) heart failure: Secondary | ICD-10-CM

## 2016-04-26 DIAGNOSIS — I051 Rheumatic mitral insufficiency: Secondary | ICD-10-CM | POA: Diagnosis not present

## 2016-04-26 LAB — POCT INR: INR: 1.7

## 2016-04-26 NOTE — Progress Notes (Signed)
[address]       [place] [address]             [phone]     CARDIOTHORACIC SURGERY OFFICE NOTE  Referring Provider is [name], MD Primary Cardiologist is CRENSHAW, [name], MD PCP is [name], NP  HPI:  Patient is a 65 year old African-American female with rheumatic heart disease including mitral stenosis status post balloon mitral valvuloplasty in 7026, chronic diastolic congestive heart failure, recurrent paroxysmal atrial fibrillation, 2 previous embolic strokes, and cerebrovascular disease status post recent right carotid endarterectomy 03/10/2016 who returns to the office today for follow-up of severe symptomatic mitral stenosis and mild mitral regurgitation. She was originally seen in consultation on 03/26/2016 after she had been readmitted to the hospital following a syncopal event that was attributed to orthostatic symptoms. She underwent follow-up transthoracic and transesophageal echocardiograms at that time that revealed the presence of classical rheumatic mitral valve disease with severe mitral stenosis and mild mitral regurgitation. There remained normal left ventricular systolic function and moderate to severe pulmonary hypertension with trivial tricuspid regurgitation. Diagnostic cardiac catheterization was performed and notable for the presence of multivessel coronary artery disease with chronic 100% occlusion of the right coronary artery and otherwise mild nonobstructive disease in the left coronary territory.  The patient was subsequently seen in consultation by Dr. Enrique Sack and underwent dental extraction. She was discharged from the hospital and returns to our office for follow-up today. Since hospital discharge she has been seen in follow-up by Dr. Enrique Sack and [name].  She returns to the office today for routine scheduled follow-up appointment to discuss possible plans for elective mitral valve replacement, coronary artery  bypass grafting, and Maze procedure. She reports that since hospital discharge she has made steady improvement. She reports that she feels quite well with ordinary activities and she denies any symptoms of shortness of breath. She only gets short of breath and tired with more strenuous physical exertion. She has purposefully avoided strenuous activity. She has not had any further dizzy spells or palpitations. Appetite and strength improved. The soreness in her mouth has almost completely resolved. The remainder of her review of systems is unremarkable.   Current Outpatient Prescriptions  Medication Sig Dispense Refill  . acetaminophen (TYLENOL) 325 MG tablet Take 650 mg by mouth every 6 (six) hours as needed for mild pain.     [name] Kitchen atorvastatin (LIPITOR) 80 MG tablet TAKE ONE TABLET BY MOUTH ONCE DAILY AT 6 PM 30 tablet 5  . chlorhexidine (PERIDEX) 0.12 % solution Rinse with 15 mLs bid after breakfast and at bedtime.  Use in a swish and spit manner. 480 mL prn  . docusate sodium (COLACE) 100 MG capsule Take 1 capsule (100 mg total) by mouth 2 (two) times daily. (Patient taking differently: Take 100 mg by mouth as directed. ) 10 capsule 0  . feeding supplement, ENSURE ENLIVE, (ENSURE ENLIVE) LIQD Take 237 mLs by mouth 2 (two) times daily between meals. 237 mL 12  . metoprolol succinate (TOPROL-XL) 25 MG 24 hr tablet Take 1 tablet (25 mg total) by mouth daily. 30 tablet 5  . polyethylene glycol (MIRALAX / GLYCOLAX) packet Take 17 g by mouth daily. 14 each 0  . warfarin (COUMADIN) 2.5 MG tablet Take 1 tablet (2.5 mg total) by mouth daily. Take 2.5 mg daily , you need INR check on Monday 27 30 tablet 0   No current facility-administered medications for this visit.  Physical Exam:   BP 140/73   Pulse 74   Resp 20   Ht 5\' 2"  (1.575 m)   Wt 158 lb (71.7 kg)   SpO2 99%   BMI 28.90 kg/m   General:  Well-appearing  Chest:   Clear to auscultation  CV:   Irregular rate and  rhythm  Incisions:  n/a  Abdomen:  Soft nontender  Extremities:  Warm and well-perfused  Diagnostic Tests:  n/a   Impression:  Patient has long-standing rheumatic heart disease with stage D severe symptomatic mitral stenosis with mild mitral regurgitation.  She underwent balloon mitral valvuloplasty in 2008 and did well until recently when she has developed progressive symptoms of exertional shortness of breath and fatigue consistent with chronic diastolic congestive heart failure, New York Heart Association functional class III. She has known history of recurrent paroxysmal atrial fibrillation and has suffered 2 previous embolic strokes, most recently in November 2014, both of which occurred while the patient was not on anticoagulation therapy.  She recently underwent right carotid endarterectomy for severe asymptomatic right internal carotid artery stenosis and appears to have recovered uneventfully.  She has not had any further dizzy spells or syncopal events since that which caused her most recent hospitalization.  She has undergone dental extraction and been cleared for surgery.   I have personally reviewed the patient's recent echocardiograms and diagnostic cardiac catheterization. Transthoracic and transesophageal echocardiogram confirmed the presence of classical rheumatic mitral valve disease with severe mitral stenosis and mild mitral regurgitation. Left ventricular systolic function is preserved. The patient has moderate to severe pulmonary hypertension.  There was trivial tricuspid regurgitation.  The mitral valve is severely thickened and heavily calcified, with anatomical features relatively unfavorable for repeat balloon mitral valvuloplasty.   Plan:  The patient and her family were counseled at length regarding the indications, risks and potential benefits of mitral valve replacement, coronary artery bypass grafting and maze procedure.  The rationale for elective surgery has been  explained, including a comparison between surgery and continued medical therapy with close follow-up.  We discussed the possibility of replacing the mitral valve using a mechanical prosthesis with the attendant need for long-term anticoagulation versus the alternative of replacing it using a bioprosthetic tissue valve with its potential for late structural valve deterioration and failure, depending upon the patient's longevity.  The patient specifically requests that her valve be replaced using a mechanical valve.   We discussed the timing of surgery and expectations for her postoperative convalescence. All of her questions have been addressed.  We tentatively plan to proceed with surgery on 05/29/2015. We have not made any changes to her current medications. The patient will return to our office on 05/17/2016 to make final plans for surgery, including plans for bridging anticoagulation therapy once she stops taking warfarin.   I spent in excess of 30 minutes during the conduct of this office consultation and >50% of this time involved direct face-to-face encounter with the patient for counseling and/or coordination of their care.    Marie Gu. Roxy Manns, MD 04/26/2016 1:49 PM

## 2016-04-26 NOTE — Patient Instructions (Signed)
Continue all previous medications without any changes at this time  

## 2016-04-27 ENCOUNTER — Other Ambulatory Visit: Payer: Self-pay | Admitting: *Deleted

## 2016-04-27 DIAGNOSIS — I4891 Unspecified atrial fibrillation: Secondary | ICD-10-CM

## 2016-04-27 DIAGNOSIS — I050 Rheumatic mitral stenosis: Secondary | ICD-10-CM

## 2016-04-27 DIAGNOSIS — I2510 Atherosclerotic heart disease of native coronary artery without angina pectoris: Secondary | ICD-10-CM

## 2016-05-04 ENCOUNTER — Ambulatory Visit (INDEPENDENT_AMBULATORY_CARE_PROVIDER_SITE_OTHER): Payer: BC Managed Care – PPO | Admitting: Pharmacist

## 2016-05-04 ENCOUNTER — Telehealth: Payer: Self-pay | Admitting: Cardiology

## 2016-05-04 DIAGNOSIS — I480 Paroxysmal atrial fibrillation: Secondary | ICD-10-CM

## 2016-05-04 LAB — PROTIME-INR: INR: 2 — AB (ref <=1.1)

## 2016-05-04 NOTE — Telephone Encounter (Signed)
Called Amy ([place])  LMTCB.

## 2016-05-04 NOTE — Telephone Encounter (Signed)
Marie Malone ( [place]) is calling to give Anti-coag results. Please call

## 2016-05-10 DIAGNOSIS — Z5189 Encounter for other specified aftercare: Secondary | ICD-10-CM

## 2016-05-10 HISTORY — DX: Encounter for other specified aftercare: Z51.89

## 2016-05-11 ENCOUNTER — Telehealth: Payer: Self-pay | Admitting: Cardiology

## 2016-05-11 ENCOUNTER — Ambulatory Visit (INDEPENDENT_AMBULATORY_CARE_PROVIDER_SITE_OTHER): Payer: BC Managed Care – PPO | Admitting: Pharmacist Clinician (PhC)/ Clinical Pharmacy Specialist

## 2016-05-11 DIAGNOSIS — I480 Paroxysmal atrial fibrillation: Secondary | ICD-10-CM

## 2016-05-11 LAB — POCT INR: INR: 2.1

## 2016-05-11 NOTE — Telephone Encounter (Signed)
See anticoag note

## 2016-05-11 NOTE — Telephone Encounter (Signed)
Home Health Nurse Amy Link calling with INR results done 05-11-16 : INR 2.1 and Protime 25.5

## 2016-05-17 ENCOUNTER — Encounter: Payer: Self-pay | Admitting: Thoracic Surgery (Cardiothoracic Vascular Surgery)

## 2016-05-17 ENCOUNTER — Ambulatory Visit (INDEPENDENT_AMBULATORY_CARE_PROVIDER_SITE_OTHER): Payer: BC Managed Care – PPO | Admitting: Thoracic Surgery (Cardiothoracic Vascular Surgery)

## 2016-05-17 VITALS — BP 145/86 | Resp 16 | Ht 62.0 in | Wt 158.0 lb

## 2016-05-17 DIAGNOSIS — I051 Rheumatic mitral insufficiency: Secondary | ICD-10-CM

## 2016-05-17 DIAGNOSIS — I050 Rheumatic mitral stenosis: Secondary | ICD-10-CM | POA: Diagnosis not present

## 2016-05-17 DIAGNOSIS — I2510 Atherosclerotic heart disease of native coronary artery without angina pectoris: Secondary | ICD-10-CM

## 2016-05-17 DIAGNOSIS — I480 Paroxysmal atrial fibrillation: Secondary | ICD-10-CM

## 2016-05-17 MED ORDER — ENOXAPARIN SODIUM 150 MG/ML ~~LOC~~ SOLN: 1.0000 mg/kg | SUBCUTANEOUS | Status: DC

## 2016-05-17 MED ORDER — AMIODARONE HCL 200 MG PO TABS: 200.0000 mg | ORAL_TABLET | Freq: Two times a day (BID) | ORAL | 0 refills | Status: DC

## 2016-05-17 NOTE — Progress Notes (Signed)
[address]       [place] [address]             [id number]     CARDIOTHORACIC SURGERY OFFICE NOTE  Referring Provider is [name], [name], MD PCP is [name], NP   HPI:  Patient is a 66 year old African-American female with long-standing rheumatic heart disease including mitral stenosis status post living mitral valvuloplasty in 5631, chronic diastolic congestive heart failure, recurrent paroxysmal atrial fibrillation, 2 previous embolic strokes, and cerebrovascular disease status post right carotid endarterectomy who returns to the office today for follow-up of severe symptomatic mitral stenosis and mild mitral regurgitation. She was originally seen in consultation on 03/26/2016, and she was last seen here in our office on 04/26/2016 at which time we made tentative plans for elective surgery next week. She returns to the office today and reports that overall she remains clinically stable. She still gets short of breath with mild activity. She gets more short of breath when she lays flat in bed, and frequently she is short of breath when she wakes up in the morning and has to sit up. She has not had any exertional chest pain or chest tightness. She has not had any more dizzy spells or near syncopal events. She denies any fevers, chills, or productive cough. She feels as though she has recovered from her carotid endarterectomy uneventfully. She is looking for to proceeding with surgery in the near future.   Current Outpatient Prescriptions  Medication Sig Dispense Refill  . acetaminophen (TYLENOL) 325 MG tablet Take 650 mg by mouth every 6 (six) hours as needed for mild pain.     [name] Kitchen atorvastatin (LIPITOR) 80 MG tablet TAKE ONE TABLET BY MOUTH ONCE DAILY AT 6 PM 30 tablet 5  . chlorhexidine (PERIDEX) 0.12 % solution Rinse with 15 mLs bid after breakfast and at bedtime.  Use in a swish and spit manner. 480 mL prn  . docusate sodium (COLACE) 100 MG capsule  Take 1 capsule (100 mg total) by mouth 2 (two) times daily. (Patient taking differently: Take 100 mg by mouth as directed. ) 10 capsule 0  . feeding supplement, ENSURE ENLIVE, (ENSURE ENLIVE) LIQD Take 237 mLs by mouth 2 (two) times daily between meals. 237 mL 12  . metoprolol succinate (TOPROL-XL) 25 MG 24 hr tablet Take 1 tablet (25 mg total) by mouth daily. 30 tablet 5  . polyethylene glycol (MIRALAX / GLYCOLAX) packet Take 17 g by mouth daily. 14 each 0  . warfarin (COUMADIN) 2.5 MG tablet Take 1 tablet (2.5 mg total) by mouth daily. Take 2.5 mg daily , you need INR check on Monday 27 30 tablet 0   No current facility-administered medications for this visit.       Physical Exam:   BP (!) 145/86 (BP Location: Left Arm, Patient Position: Sitting, Cuff Size: Large)   Resp 16   Ht 5\' 2"  (1.575 m)   Wt 158 lb (71.7 kg)   SpO2 96% Comment: ON RA  BMI 28.90 kg/m   General:  Moderately obese but well appearing  Chest:   Clear to auscultation  CV:   Irregular rate and rhythm  Incisions:  n/a  Abdomen:  Soft nontender  Extremities:  Warm and well-perfused  Diagnostic Tests:  n/a   Impression:  Patient has long-standing rheumatic heart disease with stage D severe symptomatic mitral stenosis with mild mitral regurgitation. She underwent balloon mitral valvuloplasty in 2008 and did well  until recently when she has developed progressive symptoms of exertional shortness of breath and fatigue consistent with chronic diastolic congestive heart failure, New York Heart Association functional class III. She has known history of recurrent paroxysmal atrial fibrillation and has suffered 2 previous embolic strokes, most recently in November 2014, both of which occurred while the patient was not on anticoagulation therapy. She recently underwent right carotid endarterectomy for severe asymptomatic right internal carotid artery stenosis and appears to have recovered uneventfully.  She has not had any  further dizzy spells or syncopal events since that which caused her most recent hospitalization.  She has undergone dental extraction and been cleared for surgery.   I have personally reviewed the patient's recent echocardiograms and diagnostic cardiac catheterization. Transthoracic and transesophageal echocardiogram confirmed the presence of classical rheumatic mitral valve disease with severe mitral stenosis and mild mitral regurgitation. Left ventricular systolic function is preserved. The patient has moderate to severe pulmonary hypertension. There was trivial tricuspid regurgitation. The mitral valve is severely thickened and heavily calcified, with anatomical features relatively unfavorable for repeat balloon mitral valvuloplasty.  Plan:  The patient was again counseled at length regarding the indications, risks, and potential benefits of mitral valve replacement, coronary artery bypass grafting, and Maze procedure. Expectations for her postoperative convalescence at been discussed. The rationale for elective surgery has been explained, including a comparison between surgery and continued medical therapy with close follow-up.  We discussed the possibility of replacing the mitral valve using a mechanical prosthesis with the attendant need for long-term anticoagulation versus the alternative of replacing it using a bioprosthetic tissue valve with its potential for late structural valve deterioration and failure, depending upon the patient's longevity.  The patient specifically requests that her valve be replaced using a mechanical valve. She understands and accepts all potential risks of surgery including but not limited to risk of death, stroke or other neurologic complication, myocardial infarction, congestive heart failure, respiratory failure, renal failure, bleeding requiring transfusion and/or reexploration, arrhythmia, infection or other wound complications, pneumonia, pleural and/or pericardial  effusion, pulmonary embolus, aortic dissection or other major vascular complication, or delayed complications related to valve repair or replacement including but not limited to structural valve deterioration and failure, thrombosis, embolization, endocarditis, or paravalvular leak.  The relative risks and benefits of performing a maze procedure at the time of her surgery was discussed at length, including the expected likelihood of long term freedom from recurrent symptomatic atrial fibrillation and/or atrial flutter.  We plan to proceed with surgery on 03/28/2016. The patient has been instructed to stop taking Coumadin later this week. She will begin Lovenox injections as bridging therapy once she has stopped taking Coumadin.  I spent in excess of 15 minutes during the conduct of this office consultation and >50% of this time involved direct face-to-face encounter with the patient for counseling and/or coordination of their care.    Valentina Gu. Roxy Manns, MD 05/17/2016 11:25 AM

## 2016-05-17 NOTE — Patient Instructions (Signed)
Stop taking Coumadin (warfarin) after you take your dose on Friday 05/21/16  Begin taking Lovenox injections every morning on Saturday 05/22/16 and take your last Lovenox injection on the morning of Thursday 05/27/16  Begin taking amiodarone on Saturday 05/22/16  Continue taking all other medications without change through the day before surgery.  Have nothing to eat or drink after midnight the night before surgery.  On the morning of surgery take only Toprol XL with a sip of water.

## 2016-05-18 ENCOUNTER — Other Ambulatory Visit: Payer: Self-pay | Admitting: *Deleted

## 2016-05-18 ENCOUNTER — Ambulatory Visit (INDEPENDENT_AMBULATORY_CARE_PROVIDER_SITE_OTHER): Payer: BC Managed Care – PPO | Admitting: Pharmacist Clinician (PhC)/ Clinical Pharmacy Specialist

## 2016-05-18 ENCOUNTER — Telehealth: Payer: Self-pay | Admitting: Family

## 2016-05-18 DIAGNOSIS — I480 Paroxysmal atrial fibrillation: Secondary | ICD-10-CM

## 2016-05-18 DIAGNOSIS — Z7901 Long term (current) use of anticoagulants: Secondary | ICD-10-CM

## 2016-05-18 LAB — POCT INR: INR: 2.2

## 2016-05-18 MED ORDER — ENOXAPARIN SODIUM 120 MG/0.8ML ~~LOC~~ SOLN: 1.5000 mg/kg | SUBCUTANEOUS | Status: DC

## 2016-05-18 NOTE — Telephone Encounter (Signed)
Opened in error

## 2016-05-26 ENCOUNTER — Inpatient Hospital Stay (HOSPITAL_COMMUNITY)
Admission: RE | Admit: 2016-05-26 | Discharge: 2016-05-26 | Disposition: A | Discharge status: Home / Self Care | Payer: Self-pay | Source: Ambulatory Visit

## 2016-05-26 ENCOUNTER — Encounter (HOSPITAL_COMMUNITY): Payer: Self-pay

## 2016-05-26 HISTORY — DX: Dyspnea, unspecified: R06.00

## 2016-05-26 NOTE — Progress Notes (Signed)
Pre-op call completed   Last dose of coumadin on 1/12 Last dose of lovenox is on 1/18   Patient verbalized understanding of instructions

## 2016-05-27 ENCOUNTER — Encounter (HOSPITAL_COMMUNITY): Payer: Self-pay | Admitting: Anesthesiology

## 2016-05-27 MED ORDER — PLASMA-LYTE 148 IV SOLN
INTRAVENOUS | Status: AC
  Administered 2016-05-28: 500 mL
  Filled 2016-05-27: qty 2.5

## 2016-05-27 MED ORDER — EPINEPHRINE PF 1 MG/ML IJ SOLN
0.0000 ug/min | INTRAVENOUS | Status: DC
  Filled 2016-05-27: qty 4

## 2016-05-27 MED ORDER — SODIUM CHLORIDE 0.9 % IV SOLN
INTRAVENOUS | Status: AC
  Administered 2016-05-28: .4 [IU]/h via INTRAVENOUS
  Filled 2016-05-27: qty 2.5

## 2016-05-27 MED ORDER — DEXMEDETOMIDINE HCL IN NACL 400 MCG/100ML IV SOLN
0.1000 ug/kg/h | INTRAVENOUS | Status: AC
  Administered 2016-05-28: .2 ug/kg/h via INTRAVENOUS
  Filled 2016-05-27: qty 100

## 2016-05-27 MED ORDER — TRANEXAMIC ACID (OHS) PUMP PRIME SOLUTION
2.0000 mg/kg | INTRAVENOUS | Status: DC
  Filled 2016-05-27: qty 1.43

## 2016-05-27 MED ORDER — MAGNESIUM SULFATE 50 % IJ SOLN
40.0000 meq | INTRAMUSCULAR | Status: DC
  Filled 2016-05-27: qty 10

## 2016-05-27 MED ORDER — GLUTARALDEHYDE 0.625% SOAKING SOLUTION
TOPICAL | Status: DC | PRN
  Filled 2016-05-27 (×2): qty 50

## 2016-05-27 MED ORDER — SODIUM CHLORIDE 0.9 % IV SOLN
INTRAVENOUS | Status: AC
  Administered 2016-05-28: 1000 mL
  Filled 2016-05-27: qty 1000

## 2016-05-27 MED ORDER — DEXTROSE 5 % IV SOLN
1.5000 g | INTRAVENOUS | Status: AC
  Administered 2016-05-28: .75 g via INTRAVENOUS
  Administered 2016-05-28: 1.5 g via INTRAVENOUS
  Filled 2016-05-27: qty 1.5

## 2016-05-27 MED ORDER — SODIUM CHLORIDE 0.9 % IV SOLN
30.0000 ug/min | INTRAVENOUS | Status: AC
  Administered 2016-05-28: 20 ug/min via INTRAVENOUS
  Filled 2016-05-27: qty 2

## 2016-05-27 MED ORDER — NITROGLYCERIN IN D5W 200-5 MCG/ML-% IV SOLN
2.0000 ug/min | INTRAVENOUS | Status: DC
  Filled 2016-05-27: qty 250

## 2016-05-27 MED ORDER — CEFUROXIME SODIUM 750 MG IJ SOLR
750.0000 mg | INTRAMUSCULAR | Status: DC
  Filled 2016-05-27: qty 750

## 2016-05-27 MED ORDER — VANCOMYCIN HCL 10 G IV SOLR
1250.0000 mg | INTRAVENOUS | Status: AC
  Administered 2016-05-28: 1250 mg via INTRAVENOUS
  Filled 2016-05-27 (×2): qty 1250

## 2016-05-27 MED ORDER — TRANEXAMIC ACID 1000 MG/10ML IV SOLN
1.5000 mg/kg/h | INTRAVENOUS | Status: AC
  Administered 2016-05-28: 1.5 mg/kg/h via INTRAVENOUS
  Filled 2016-05-27: qty 25

## 2016-05-27 MED ORDER — POTASSIUM CHLORIDE 2 MEQ/ML IV SOLN
80.0000 meq | INTRAVENOUS | Status: DC
Start: 2016-05-28 — End: 2016-05-28
  Filled 2016-05-27: qty 40

## 2016-05-27 MED ORDER — DOPAMINE-DEXTROSE 3.2-5 MG/ML-% IV SOLN
0.0000 ug/kg/min | INTRAVENOUS | Status: DC
  Filled 2016-05-27: qty 250

## 2016-05-27 MED ORDER — SODIUM CHLORIDE 0.9 % IV SOLN
INTRAVENOUS | Status: DC
  Filled 2016-05-27: qty 30

## 2016-05-27 MED ORDER — TRANEXAMIC ACID (OHS) BOLUS VIA INFUSION
15.0000 mg/kg | INTRAVENOUS | Status: AC
  Administered 2016-05-28: 1075.5 mg via INTRAVENOUS
  Filled 2016-05-27: qty 1076

## 2016-05-27 NOTE — H&P (Signed)
[address]       [place] [address]             [phone]          CARDIOTHORACIC SURGERY HISTORY AND PHYSICAL EXAM  Referring Provider is CROITORU, Dani Gobble, MD Primary Cardiologist is CRENSHAW, [name], MD PCP is [name], NP  HPI:  Patient is a 66 year old African-American female with rheumatic heart disease including mitral stenosis status post balloon mitral valvuloplasty in 4081, chronic diastolic congestive heart failure, recurrent paroxysmal atrial fibrillation, 2 previous embolic strokes who has been referred for surgical consultation to discuss treatment options for management of severe mitral stenosis.  The patient denies any known history of rheumatic fever during childhood but she states that she had recurrent streptococcal infections on many occasions during her teenage years. She first presented with symptoms of shortness of breath in 2008 and was diagnosed with severe mitral stenosis. She was initially evaluated by a cardiologist in [facility].  Cardiac catheterization performed in June of 2008 showed normal LV function, no coronary disease, moderate mitral stenosis with a valve area of 1.1 cm, and severe pulmonary hypertension. TEE in [facility] in 2008 showed normal LV function and moderate mitral stenosis with a valve area of 1.4 cm, mild MR. She was referred to [facility] where she underwent balloon mitral valvuloplasty. She did well for several years but she suffered an embolic stroke in 4481.  She was reportedly anticoagulated using warfarin for a period of time but this was later stopped. In 2014 the patient was referred to [name] for evaluation and she has been followed closely ever since. In November 2014 she suffered a second embolic stroke which involved a fairly large area in the left MCA distribution. Symptoms of right-sided weakness improved very quickly. TEE performed at that time revealed mild mitral  stenosis and mild mitral regurgitation. There was spontaneous contrast in the dilated left atrium and left atrial appendage, but no thrombus. There was a small oscillating density on the anterior mitral valve leaflet of uncertain etiology, and vegetation could not be excluded.  Blood cultures were negative, but the patient had been receiving oral doxycycline. There were no other signs of endocarditis, although sed rate was elevated. The patient was started on Plavix because of her stroke.  An implantable loop recorder was placed and she was treated with a 6 week course of antibiotics.    Implantable loop recorder confirmed the presence of recurrent paroxysmal atrial fibrillation, and the patient was started on warfarin.  The patient did well for approximately 2 years and has been followed intermittently by [name] and [name].  Follow-up transthoracic echocardiogram performed in August of this year revealed normal left ventricular systolic function with moderate mitral stenosis (mean transvalvular gradient estimated 8 mmHg) moderate mitral regurgitation, and severe left atrial enlargement. At that time the patient began to complain of occasional substernal chest tightness and worsening dyspnea with exertion. Follow-up carotid duplex scan performed at that time revealed 60-79% stenosis of the right internal carotid artery and CTA was recommended.  CTA revealed radiographic string sign consistent with tight right internal carotid artery stenosis. The patient subsequently underwent elective right carotid endarterectomy with patch angioplasty by [name] on 03/10/2016.  She initially did well but she was readmitted to the hospital on 03/19/2016 with altered mental status associated with tachycardia and hypotension. She was observed overnight in the hospital where she was given gentle IV  hydration. It was felt that her symptoms were likely related to hypotension and orthostasis. She was discharged home the  following day but was again readmitted less than 24 hours later following a frank syncopal episode.  In the emergency department she was still noted to be orthostatic with drop in blood pressure from 152/61 to 118/62.  She was given IV hydration.  MRI of the brain revealed no evidence for acute stroke. Neurology was consulted and EEG revealed normal awake and sleep findings with no seizure-like activity.  EKG revealed sinus rhythm with occasional brief runs of nonsustained VT.  follow-up echocardiogram revealed normal left ventricular systolic function with severe mitral stenosis and mild mitral regurgitation. Peak and mean transvalvular gradients across the mitral valve were estimated 12 and 9 mmHg, respectively.  Indexed valve area by pressure half-time was reported 0.82 cm/m.  The patient subsequently underwent transesophageal echocardiogram 03/24/2016. This confirmed the presence of rheumatic mitral valve disease with severe mitral stenosis. Mean transvalvular gradient across mitral valve was estimated 11 mmHg with valve area by pressure half-time estimated 1.25 cm but valve area using the continuity equation only 0.77 cm. The left atrium was dilated. There was no thrombus in the left atrial appendage.  The patient underwent left and right heart catheterization 03/25/2016. This confirmed the presence of normal left ventricular systolic function. Mean transvalvular gradient across the mitral valve was entered 10 mmHg corresponding to valve area calculated 1.3 cm, indexed valve area 0.76 cm/m. The patient had severe single-vessel coronary artery disease with 100% chronic occlusion of the right coronary artery and left to right collaterals. There was moderate pulmonary hypertension with PA pressures measured 66/21,  mean pulmonary capillary wedge pressure 13 mmHg and mean CVP reported 1 mmHg . Cardiothoracic surgical consultation was requested.  The patient is married and lives locally in [place] with  her husband. Her daughter lives in [place] but is at the bedside with the patient's husband today for consultation. 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 She reports that her only significant physical limitation is that of exertional shortness of breath. Symptoms have been progressing for the last several months and she now gets short of breath with relatively mild activity. She denies any history of resting shortness of breath, PND, orthopnea, or lower extremity edema. She reports occasional palpitations. Prior to carotid endarterectomy she has not previously experienced any episodes of dizzy spells or syncope.  At this point she feels as though she has not yet completely recovered from her recent surgery. Her appetite is been slowly improving. She knows that she has poor dentition and she has not seen a dentist in many years.  Patient was originally seen in consultation on 03/26/2016 after she had been readmitted to the hospital following a syncopal event that was attributed to orthostatic symptoms. She underwent follow-up transthoracic and transesophageal echocardiograms at that time that revealed the presence of classical rheumatic mitral valve disease with severe mitral stenosis and mild mitral regurgitation. There remained normal left ventricular systolic function and moderate to severe pulmonary hypertension with trivial tricuspid regurgitation. Diagnostic cardiac catheterization was performed and notable for the presence of multivessel coronary artery disease with chronic 100% occlusion of the right coronary artery and otherwise mild nonobstructive disease in the left coronary territory.  The patient was subsequently seen in consultation by Dr. Enrique Sack and underwent dental extraction.  She was discharged from the hospital and returns to our office for follow-up  today. Since hospital discharge she has been seen in follow-up by Dr. Enrique Sack and [name].  She returns to the office today for routine scheduled follow-up appointment to discuss possible plans for elective mitral valve replacement, coronary artery bypass grafting, and Maze procedure. She reports that since hospital discharge she has made steady improvement. She reports that she feels quite well with ordinary activities and she denies any symptoms of shortness of breath. She only gets short of breath and tired with more strenuous physical exertion. She has purposefully avoided strenuous activity. She has not had any further dizzy spells or palpitations. Appetite and strength improved. The soreness in her mouth has almost completely resolved. The remainder of her review of systems is unremarkable.  Patient was last seen here in our office on 04/26/2016 at which time we made tentative plans for elective surgery next week. She returns to the office today and reports that overall she remains clinically stable. She still gets short of breath with mild activity. She gets more short of breath when she lays flat in bed, and frequently she is short of breath when she wakes up in the morning and has to sit up. She has not had any exertional chest pain or chest tightness. She has not had any more dizzy spells or near syncopal events. She denies any fevers, chills, or productive cough. She feels as though she has recovered from her carotid endarterectomy uneventfully. She is looking for to proceeding with surgery in the near future.   Past Medical History:  Diagnosis Date  . Atrial fibrillation ([place])   . Chronic diastolic CHF (congestive heart failure) ([place])   . Complication of anesthesia    difficult to wake up from anesthesia  . Dyspnea   . Heart murmur   . History of rheumatic fever    as a child  . Hyperlipidemia   . Hypertension   . Mitral stenosis   . OSA (obstructive sleep apnea)    mild per  sleep study 2008  . Paroxysmal atrial fibrillation ([facility])   . Pneumonia    double pneumonia once  . S/P balloon mitral valvuloplasty 2008   . Stroke [facility])    x2    Past Surgical History:  Procedure Laterality Date  . APPENDECTOMY  1989   appendix ruptured,had peritonitis  . BALLOON VALVULOPLASTY  2008   [facility]  . CARDIAC CATHETERIZATION N/A 03/25/2016   Procedure: Right/Left Heart Cath and Coronary Angiography;  Surgeon: Jettie Booze, MD;  Location: [facility] CV LAB;  Service: Cardiovascular;  Laterality: N/A;  . COLONOSCOPY W/ POLYPECTOMY    . ENDARTERECTOMY Right 03/10/2016   Procedure: ENDARTERECTOMY CAROTID RIGHT;  Surgeon: Serafina Mitchell, MD;  Location: [facility];  Service: Vascular;  Laterality: Right;  . EP IMPLANTABLE DEVICE N/A 03/11/2016   Procedure: Loop Recorder Removal;  Surgeon: Deboraha Sprang, MD;  Location: [facility] CV LAB;  Service: Cardiovascular;  Laterality: N/A;  . 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 LOOP RECORDER IMPLANT N/A 04/10/2013   Procedure: LOOP RECORDER IMPLANT;  Surgeon: Coralyn Mark, MD;  Location: [facility] CATH LAB;  Service: Cardiovascular;  Laterality: N/A;  . MULTIPLE EXTRACTIONS WITH ALVEOLOPLASTY N/A 03/30/2016   Procedure: EXTRACTION OF TOOTH #'S 2,5,6,11,15,16,20-30 AND 32 WITH ALVEOLOPLASTY AND BILATERAL MAXILLARY LATERAL EXOSTOSES REDUCTIONS;  Surgeon: Lenn Cal, DDS;  Location: [facility]  OR;  Service: Oral Surgery;  Laterality: N/A;  . PATCH ANGIOPLASTY Right 03/10/2016   Procedure: PATCH ANGIOPLASTY CAROTID RIGHT USING Rueben Bash BIOLOGIC PATCH;  Surgeon: Serafina Mitchell, MD;  Location: [facility];  Service: Vascular;  Laterality: Right;  . TEE WITHOUT CARDIOVERSION N/A 04/10/2013   Procedure: TRANSESOPHAGEAL ECHOCARDIOGRAM (TEE);  Surgeon: [name], MD;  Location: [facility] ENDOSCOPY;  Service: Cardiovascular;  Laterality: N/A;  . TEE WITHOUT CARDIOVERSION N/A 03/24/2016   Procedure:  TRANSESOPHAGEAL ECHOCARDIOGRAM (TEE);  Surgeon: Jerline Pain, MD;  Location: [facility] ENDOSCOPY;  Service: Cardiovascular;  Laterality: N/A;  . TUBAL LIGATION      Family History  Problem Relation Age of Onset  . Diabetes Mother   . Stroke Mother   . Diabetes Father   . Heart disease Father     CHF  . Cancer Father     kidney  . Diabetes Sister   . Diabetes Brother   . Hypertension Daughter     Social History Social History  Substance Use Topics  . Smoking status: Former Smoker    Packs/day: 1.00    Years: 5.00    Types: Cigarettes    Start date: 07/15/1970    Quit date: 05/11/1975  . Smokeless tobacco: Never Used     Comment: quit smoking 36 years ago  . Alcohol use No     Comment: drank years ago when young    Prior to Admission medications   Medication Sig Start Date End Date Taking? Authorizing Provider  acetaminophen (TYLENOL) 325 MG tablet Take 650 mg by mouth every 6 (six) hours as needed for mild pain.    Yes Historical Provider, MD  atorvastatin (LIPITOR) 80 MG tablet TAKE ONE TABLET BY MOUTH ONCE DAILY AT 6 PM 03/15/16  Yes Debbrah Alar, NP  chlorhexidine (PERIDEX) 0.12 % solution Rinse with 15 mLs bid after breakfast and at bedtime.  Use in a swish and spit manner. 04/12/16  Yes Lenn Cal, DDS  docusate sodium (COLACE) 100 MG capsule Take 1 capsule (100 mg total) by mouth 2 (two) times daily. Patient taking differently: Take 100 mg by mouth daily as needed for mild constipation.  03/20/16  Yes Jennifer Chahn-Yang Choi, DO  metoprolol succinate (TOPROL-XL) 25 MG 24 hr tablet Take 1 tablet (25 mg total) by mouth daily. 03/15/16  Yes Debbrah Alar, NP  polyethylene glycol (MIRALAX / GLYCOLAX) packet Take 17 g by mouth daily. 03/20/16  Yes Jennifer Chahn-Yang Choi, DO  warfarin (COUMADIN) 2.5 MG tablet Take 1 tablet (2.5 mg total) by mouth daily. Take 2.5 mg daily , you need INR check on Monday 27 Patient taking differently: Take 1.25 mg by mouth daily at 6  PM. Half tablet daily 04/03/16  Yes Belkys A Regalado, MD  amiodarone (PACERONE) 200 MG tablet Take 1 tablet (200 mg total) by mouth 2 (two) times daily. 05/17/16   Rexene Alberts, MD  enoxaparin (LOVENOX) 120 MG/0.8ML injection Inject 0.72 mLs (110 mg total) into the skin daily. 05/22/16 05/27/16  Rexene Alberts, MD    Allergies  Allergen Reactions  . No Known Allergies      Review of Systems:              General:                      normal appetite, decreased energy, no weight gain, no weight loss, no fever  Cardiac:                       no chest pain with exertion, occasional chest pain at rest, + SOB with exertion, no resting SOB, no PND, no orthopnea, + palpitations, + arrhythmia, + atrial fibrillation, no LE edema, + dizzy spells, + syncope             Respiratory:                 + shortness of breath, no home oxygen, no productive cough, no dry cough, no bronchitis, no wheezing, no hemoptysis, no asthma, no pain with inspiration or cough, + sleep apnea, no CPAP at night             GI:                               mild difficulty swallowing since carotid endarterectomy, no reflux, no frequent heartburn, no hiatal hernia, no abdominal pain, + constipation, no diarrhea, no hematochezia, no hematemesis, no melena             GU:                              no dysuria,  no frequency, no urinary tract infection, no hematuria, no kidney stones, no kidney disease             Vascular:                     no pain suggestive of claudication, no pain in feet, no leg cramps, no varicose veins, no DVT, no non-healing foot ulcer             Neuro:                         + stroke, no TIA's, no seizures, no headaches, no temporary blindness one eye,  no slurred speech, no peripheral neuropathy, no chronic pain, no instability of gait, no memory/cognitive dysfunction             Musculoskeletal:         no arthritis, no joint swelling, no myalgias, no difficulty walking, normal mobility                Skin:                            no rash, no itching, no skin infections, no pressure sores or ulcerations             Psych:                         no anxiety, no depression, no nervousness, no unusual recent stress             Eyes:                           no blurry vision, no floaters, no recent vision changes, + wears glasses or contacts             ENT:  no hearing loss, + loose or painful teeth, no dentures, last saw dentist many years ago             Hematologic:               no easy bruising, no abnormal bleeding, no clotting disorder, no frequent epistaxis             Endocrine:                   no diabetes, does not check CBG's at home                           Physical Exam:              BP 118/60   Pulse 76   Temp 98.2 F (36.8 C) (Oral)   Resp 18   Ht 5\' 2"  (1.575 m)   Wt 158 lb 9.6 oz (71.9 kg)   SpO2 100%   BMI 29.01 kg/m              General:                      Mildly obese,  well-appearing             HEENT:                       Unremarkable except poor dentition with multiple missing teeth             Neck:                           no JVD, no bruits, no adenopathy, incision on right neck healing nicely             Chest:                          clear to auscultation, symmetrical breath sounds, no wheezes, no rhonchi              CV:                              RRR, no murmur              Abdomen:                    soft, non-tender, no masses              Extremities:                 warm, well-perfused, pulses not palpable, no lower extremity edema             Rectal/GU                   Deferred             Neuro:                         Grossly non-focal and symmetrical throughout             Skin:                            Clean and dry, no rashes,  no breakdown  Diagnostic Tests:  Transthoracic Echocardiography  Patient: Marie Malone, Marie Malone MR #: [id number] Study Date: 03/23/2016 Gender:  F Age: 33 Height: 157.5 cm Weight: 72.8 kg BSA: 1.81 m^2 Pt. Status: Room: [facility], [name] PERFORMING [name], Inpatient ADMITTING Dessa Phi Chahn-Yang ATTENDING Dessa Phi Chahn-Yang Chase Caller, Jennifer Chahn-Yang REFERRING Dessa Phi Chahn-Yang SONOGRAPHER Chelsea Androw  cc:  ------------------------------------------------------------------- LV EF: 55% - 60%  ------------------------------------------------------------------- Indications: Rheumatic MS 394.0.  ------------------------------------------------------------------- History: PMH: Atrial fibrillation. Risk factors: Hypertension. Dyslipidemia.  ------------------------------------------------------------------- Study Conclusions  - Left ventricle: The cavity size was normal. Wall thickness was increased in a pattern of mild LVH. Systolic function was normal. The estimated ejection fraction was in the range of 55% to 60%. Wall motion was normal; there were no regional wall motion abnormalities. Doppler parameters are consistent with high ventricular filling pressure. - Ventricular septum: The contour showed diastolic flattening and systolic flattening. - Mitral valve: Calcified annulus. Severely thickened leaflets . The findings are consistent with severe stenosis. There was mild regurgitation. Valve area by pressure half-time: 1.48 cm^2. - Left atrium: The atrium was severely dilated. - Atrial septum: The septum bowed from left to right, consistent with increased left atrial pressure. - Pericardium, extracardiac: A small pericardial effusion was identified.  Impressions:  - Normal LV systolic function; mild LVH; elevated LV filling pressure; calcified aortic valve with no significant AS; thickened, calcified MV with probable severe MS (MVA by pressure halftime 1.48  cm); severe LAE.  ------------------------------------------------------------------- Study data: Comparison was made to the study of 12/30/2015. Study status: Routine. Procedure: The patient reported no pain pre or post test. Transthoracic echocardiography. Image quality was adequate. Study completion: There were no complications. Transthoracic echocardiography. M-mode, complete 2D, spectral Doppler, and color Doppler. Birthdate: Patient birthdate: Nov 10, 1950. Age: Patient is 66 yr old. Sex: Gender: female. BMI: 29.3 kg/m^2. Blood pressure: 99/63 Patient status: Inpatient. Study date: Study date: 03/23/2016. Study time: 02:45 PM. Location: Bedside.  -------------------------------------------------------------------  ------------------------------------------------------------------- Left ventricle: The cavity size was normal. Wall thickness was increased in a pattern of mild LVH. Systolic function was normal. The estimated ejection fraction was in the range of 55% to 60%. Wall motion was normal; there were no regional wall motion abnormalities. Doppler parameters are consistent with high ventricular filling pressure.  ------------------------------------------------------------------- Aortic valve: Trileaflet; moderately calcified leaflets. Mobility was not restricted. Doppler: Transvalvular velocity was within the normal range. There was no stenosis. There was no regurgitation. Peak velocity ratio of LVOT to aortic valve: 0.59. Indexed valve area (Vmax): 0.92 cm^2/m^2. Peak gradient (S): 16 mm Hg.  ------------------------------------------------------------------- Aorta: Aortic root: The aortic root was normal in size.  ------------------------------------------------------------------- Mitral valve: Calcified annulus. Severely thickened leaflets . Doppler: The findings are consistent with severe stenosis. There was mild  regurgitation. Valve area by pressure half-time: 1.48 cm^2. Indexed valve area by pressure half-time: 0.82 cm^2/m^2. Indexed valve area by continuity equation (using LVOT flow): 0.61 cm^2/m^2. Mean gradient (D): 9 mm Hg. Peak gradient (D): 12 mm Hg.  ------------------------------------------------------------------- Left atrium: The atrium was severely dilated.  ------------------------------------------------------------------- Atrial septum: The septum bowed from left to right, consistent with increased left atrial pressure.  ------------------------------------------------------------------- Right ventricle: The cavity size was normal. Systolic function was normal.  ------------------------------------------------------------------- Ventricular septum: The contour showed diastolic flattening and systolic flattening.  ------------------------------------------------------------------- Pulmonic valve: Doppler: Transvalvular velocity was within the normal range. There was no evidence for stenosis.  ------------------------------------------------------------------- Tricuspid valve: Structurally normal valve. Doppler: Transvalvular velocity was within the normal range. There  was trivial regurgitation.  ------------------------------------------------------------------- Right atrium: The atrium was normal in size.  ------------------------------------------------------------------- Pericardium: A small pericardial effusion was identified.  ------------------------------------------------------------------- Systemic veins: Inferior vena cava: The vessel was normal in size.  ------------------------------------------------------------------- Measurements  Left ventricle Value Reference LV ID, ED, PLAX chordal (L) 31.9 mm 43 - 52 LV ID, ES, PLAX chordal 24.1 mm  23 - 38 LV fx shortening, PLAX chordal (L) 24 % >=29 LV PW thickness, ED 11.9 mm --------- IVS/LV PW ratio, ED 1.01 <=1.3 Stroke volume, 2D 54 ml --------- Stroke volume/bsa, 2D 30 ml/m^2 --------- LV e&', lateral 4.68 cm/s --------- LV E/e&', lateral 36.55 --------- LV e&', medial 5.44 cm/s --------- LV E/e&', medial 31.44 --------- LV e&', average 5.06 cm/s --------- LV E/e&', average 33.81 ---------  Ventricular septum Value Reference IVS thickness, ED 12 mm ---------  LVOT Value Reference LVOT ID, S 19 mm --------- LVOT area 2.84 cm^2 --------- LVOT peak velocity, S 116 cm/s --------- LVOT mean velocity, S 72 cm/s --------- LVOT VTI, S 19.1 cm --------- LVOT peak gradient, S 5 mm Hg ---------  Aortic valve Value Reference Aortic valve peak velocity, S 197 cm/s --------- Aortic peak gradient, S 16 mm Hg --------- Velocity ratio, peak, LVOT/AV 0.59 --------- Aortic valve area/bsa, peak 0.92 cm^2/m^2 --------- velocity  Aorta Value Reference Aortic root ID, ED 26 mm ---------  Left atrium Value  Reference LA ID, A-P, ES 43 mm --------- LA ID/bsa, A-P (H) 2.38 cm/m^2 <=2.2 LA volume, S 79 ml --------- LA volume/bsa, S 43.7 ml/m^2 --------- LA volume, ES, 1-p A4C 85.3 ml --------- LA volume/bsa, ES, 1-p A4C 47.2 ml/m^2 --------- LA volume, ES, 1-p A2C 71.9 ml --------- LA volume/bsa, ES, 1-p A2C 39.8 ml/m^2 ---------  Mitral valve Value Reference Mitral E-wave peak velocity 171.06 cm/s --------- Mitral A-wave peak velocity 153 cm/s --------- Mitral mean velocity, D 142 cm/s --------- Mitral deceleration slope 333.4 cm/s^2 --------- Mitral deceleration time (H) 513 ms 150 - 230 Mitral pressure half-time 149 ms --------- Mitral mean gradient, D 9 mm Hg --------- Mitral peak gradient, D 12 mm Hg --------- Mitral E/A ratio, peak 1.12 --------- Mitral valve area, PHT, DP 1.48 cm^2 --------- Mitral valve area/bsa, PHT, DP 0.82 cm^2/m^2 --------- Mitral valve area/bsa, LVOT 0.61 cm^2/m^2 --------- continuity Mitral annulus VTI, D 49 cm ---------  Systemic veins Value Reference Estimated CVP 3 mm Hg ---------  Right ventricle Value Reference TAPSE 19.9 mm --------- RV s&', lateral, S 12.3 cm/s ---------  Legend: (L) and (H) mark values outside  specified reference range.  ------------------------------------------------------------------- Prepared and Electronically Authenticated by  [name] 2017-11-14T17:14:56   Transesophageal Echocardiography  Patient: Marie Malone, Marie Malone MR #: [id number] Study Date: 03/24/2016 Gender: F Age: 30 Height: 157.5 cm Weight: 72.8 kg BSA: 1.81 m^2 Pt. Status: Room: 5W25C  PERFORMING Candee Furbish, M.D. ATTENDING Ghimire, Shanker Creta Levin, Brittainy M REFERRING [place], [place] M SONOGRAPHER Donata Clay ADMITTING Dessa Phi Chahn-Yang  cc:  ------------------------------------------------------------------- LV EF: 55% - 60%  ------------------------------------------------------------------- Indications: 424.0 Mitral valve disease.  ------------------------------------------------------------------- History: PMH: Syncope. PAF. Tachycardia. Recent R CEA.  ------------------------------------------------------------------- Study Conclusions  - Left ventricle: Systolic function was normal. The estimated ejection fraction was in the range of 55% to 60%. - Aortic valve: Left coronary cusp mobility was mildly restricted. No evidence of vegetation. - Mitral valve: Mobility of the posterior leaflet was moderately restricted. The findings are consistent with moderate to severe stenosis. There was trivial regurgitation. Mean gradient (D): 11 mm Hg. Valve area by pressure  half-time: 1.25 cm^2. Valve area by continuity equation (using LVOT flow): 0.77 cm^2. - Left atrium: The atrium was dilated. No evidence of thrombus in the appendage. No evidence of thrombus in the atrial cavity or appendage. - Right atrium: No evidence of thrombus in the atrial cavity or appendage. - Tricuspid valve: No evidence of vegetation. - Pulmonic valve: No evidence of vegetation. -  Superior vena cava: The study excluded a thrombus.  ------------------------------------------------------------------- Study data: Study status: Routine. Consent: The risks, benefits, and alternatives to the procedure were explained to the patient and informed consent was obtained. Procedure: The patient reported no pain pre or post test. Initial setup. The patient was brought to the laboratory. Surface ECG leads were monitored. Sedation. Moderate sedation was administered by cardiology staff. Transesophageal echocardiography. A transesophageal probe was inserted by the attending cardiologistwith some difficulty. Image quality was adequate. Study completion: The patient tolerated the procedure well. There were no complications. Administered medications: Midazolam, 5mg , IV. Fentanyl, 74mcg, IV. Diagnostic transesophageal echocardiography. 2D and color Doppler. Birthdate: Patient birthdate: 23-Jul-1950. Age: Patient is 66 yr old. Sex: Gender: female. BMI: 29.3 kg/m^2. Blood pressure: 135/85 Patient status: Inpatient. Study date: Study date: 03/24/2016. Study time: 12:01 PM. Location: Endoscopy.  -------------------------------------------------------------------  ------------------------------------------------------------------- Left ventricle: Systolic function was normal. The estimated ejection fraction was in the range of 55% to 60%.  ------------------------------------------------------------------- Aortic valve: Mildly calcified leaflets. Cusp separation was normal. Left coronary cusp mobility was mildly restricted. No evidence of vegetation. Doppler: There was no regurgitation.  ------------------------------------------------------------------- Aorta: The aorta was not dilated, mildly calcified, and non-diseased.  ------------------------------------------------------------------- Mitral valve: Severely thickened leaflets .  Mobility of the posterior leaflet was moderately restricted. Doppler: The findings are consistent with moderate to severe stenosis. There was trivial regurgitation. Valve area by pressure half-time: 1.25 cm^2. Indexed valve area by pressure half-time: 0.69 cm^2/m^2. Valve area by continuity equation (using LVOT flow): 0.77 cm^2. Indexed valve area by continuity equation (using LVOT flow): 0.43 cm^2/m^2. Mean gradient (D): 11 mm Hg.  ------------------------------------------------------------------- Left atrium: The atrium was dilated. No evidence of thrombus in the appendage. No evidence of thrombus in the atrial cavity or appendage. The appendage was of normal size. Emptying velocity was normal.  ------------------------------------------------------------------- Right ventricle: The cavity size was normal. Wall thickness was normal. Systolic function was normal.  ------------------------------------------------------------------- Pulmonic valve: Structurally normal valve. Cusp separation was normal. No evidence of vegetation. Doppler: There was mild regurgitation.  ------------------------------------------------------------------- Tricuspid valve: Structurally normal valve. Leaflet separation was normal. No evidence of vegetation. Doppler: There was trivial regurgitation.  ------------------------------------------------------------------- Right atrium: The atrium was normal in size. No evidence of thrombus in the atrial cavity or appendage.  ------------------------------------------------------------------- Pericardium: The pericardium was normal in appearance. There was no pericardial effusion.  ------------------------------------------------------------------- Systemic veins: Superior vena cava: The study excluded a thrombus.  ------------------------------------------------------------------- Post procedure  conclusions Ascending Aorta:  - The aorta was not dilated, mildly calcified, and non-diseased.  ------------------------------------------------------------------- Measurements  Left ventricle Value Stroke volume, 2D 28 ml Stroke volume/bsa, 2D 15 ml/m^2  LVOT Value LVOT ID, S 19 mm LVOT area 2.84 cm^2 LVOT peak velocity, S 71.4 cm/s LVOT mean velocity, S 46.6 cm/s LVOT VTI, S 9.92 cm LVOT peak gradient, S 2 mm Hg  Mitral valve Value Mitral mean velocity, D 157 cm/s Mitral pressure half-time 124 ms Mitral mean gradient, D 11 mm Hg Mitral valve area, PHT, DP 1.25 cm^2 Mitral valve area/bsa, PHT, DP 0.69 cm^2/m^2 Mitral valve area, LVOT continuity 0.77 cm^2 Mitral valve area/bsa, LVOT  continuity 0.43 cm^2/m^2 Mitral annulus VTI, D 36.5 cm  Legend: (L) and (H) mark values outside specified reference range.  ------------------------------------------------------------------- Prepared and Electronically Authenticated by  Candee Furbish, M.D. 2017-11-15T15:27:36     Right/Left Heart Cath and Coronary Angiography  Conclusion     The left ventricular systolic function is normal.  The left ventricular ejection fraction is 55-65% by visual estimate.  There is no aortic valve stenosis.  There is moderate mitral valve stenosis. Mean gradient 10 mm Hg. MV area 1.3 cm2  Mid Cx to Dist Cx lesion, 10 %stenosed.  Ost 1st Mrg to 1st Mrg lesion, 20 %stenosed.  Prox RCA lesion, 100  %stenosed. Left to right and right to right collaterals are present.  Hemodynamic findings consistent with moderate pulmonary hypertension and mitral valve stenosis.  LV end diastolic pressure is normal.  Further plans per inpatient team. D/w Dr. Sallyanne Kuster. Will obtain surgical evaluation for mitral valve repair. Resume heparin 6 hours post sheath pull.    Indications   Mitral valve stenosis, unspecified etiology [I05.0 (ICD-10-CM)]  Procedural Details/Technique   Technical Details The risks, benefits, and details of the procedure were explained to the patient. The patient verbalized understanding and wanted to proceed. Informed written consent was obtained.  PROCEDURE TECHNIQUE: After Xylocaine anesthesia, access in the left antecubital area was attempted through a previously placed peripheral IV. This was unsuccessful. Subsequently, a 7 French sheath was placed in the right common femoral vein after Xylocaine anesthesia. A 7 French balloontipped Swan-Ganz catheter was advanced to the pulmonary artery under fluoroscopic guidance. Hemodynamic pressures were obtained. Oxygen saturations were obtained. After Xylocaine anesthesia, a 42F sheath was placed in the right radial artery with a single anterior needle wall stick. Left coronary angiography was done using a Judkins L3.5 guide catheter. Right coronary angiography was done using a Judkins R4 guide catheter. Left heart cath/ventriculography was done using a pigtail catheter. Simultaneous pulmonary capillary wedge pressure and LV pressure was obtained.    Contrast: 60 cc     Estimated blood loss <50 mL.  During this procedure the patient was administered the following to achieve and maintain moderate conscious sedation: Versed 1 mg, Fentanyl 25 mcg, while the patient's heart rate, blood pressure, and oxygen saturation were continuously monitored. The period of conscious sedation was 60 minutes, of which I was present face-to-face 100%  of this time.    Coronary Findings   Dominance: Right  Left Circumflex  Mid Cx to Dist Cx lesion, 10% stenosed.  First Obtuse Marginal Branch  Ost 1st Mrg to 1st Mrg lesion, 20% stenosed.  Right Coronary Artery  Prox RCA lesion, 100% stenosed.  Right Posterior Descending Artery  RPDA filled by collaterals from Acute Mrg. RPDA filled by collaterals from Dist LAD.  Right Heart   Right Heart Pressures Hemodynamic findings consistent with moderate pulmonary hypertension and mitral valve stenosis. LV EDP is normal.    Right Atrium Right atrial pressure is normal.    Wall Motion              Left Heart   Left Ventricle The left ventricular systolic function is normal. LV end diastolic pressure is normal. The left ventricular ejection fraction is 55-65% by visual estimate. No regional wall motion abnormalities.    Mitral Valve There is moderate mitral valve stenosis.    Aortic Valve There is no aortic valve stenosis.    Coronary Diagrams   Diagnostic Diagram     Implants  No implant documentation for this case.  PACS Images   Show images for Cardiac catheterization   Link to Procedure Log   Procedure Log    Hemo Data   Flowsheet Row Most Recent Value  Fick Cardiac Output 4.12 L/min  Fick Cardiac Output Index 2.37 (L/min)/BSA  Aortic Mean Gradient 9.2 mmHg  Aortic Peak Gradient 0 mmHg  Aortic Valve Area >3.50  Aortic Value Area Index 2.01 cm2/BSA  Mitral Mean Gradient 10.1 mmHg  Mitral Peak Gradient 9 mmHg  Mitral Valve Area Index 0.76 cm2/BSA  RA A Wave 1 mmHg  RA V Wave 5 mmHg  RA Mean 1 mmHg  RV Systolic Pressure 57 mmHg  RV Diastolic Pressure 0 mmHg  RV EDP 1 mmHg  PA Systolic Pressure 66 mmHg  PA Diastolic Pressure 21 mmHg  PA Mean 36 mmHg  PW A Wave 13 mmHg  PW V Wave 15 mmHg  PW Mean 13 mmHg  AO Systolic Pressure 284 mmHg  AO Diastolic Pressure 70 mmHg  AO Mean 96 mmHg  LV Systolic Pressure 132 mmHg  LV Diastolic Pressure  0 mmHg  LV EDP 4 mmHg  Arterial Occlusion Pressure Extended Systolic Pressure 440 mmHg  Arterial Occlusion Pressure Extended Diastolic Pressure 65 mmHg  Arterial Occlusion Pressure Extended Mean Pressure 93 mmHg  Left Ventricular Apex Extended Systolic Pressure 102 mmHg  Left Ventricular Apex Extended Diastolic Pressure 0 mmHg  Left Ventricular Apex Extended EDP Pressure 3 mmHg  QP/QS 1  TPVR Index 15.2 HRUI  TSVR Index 40.55 HRUI  PVR SVR Ratio 0.22  TPVR/TSVR Ratio 0.38      Impression:  Patient has long-standing rheumatic heart disease with stage D severe symptomatic mitral stenosis with mild mitral regurgitation. She underwent balloon mitral valvuloplasty in 2008 and did well until recently when she has developed progressive symptoms of exertional shortness of breath and fatigue consistent with chronic diastolic congestive heart failure, New York Heart Association functional class III. She has known history of recurrent paroxysmal atrial fibrillation and has suffered 2 previous embolic strokes, most recently in November 2014, both of which occurred while the patient was not on anticoagulation therapy. She recently underwent right carotid endarterectomy for severe asymptomatic right internal carotid artery stenosis andappears to have recovereduneventfully. She has not had any further dizzy spells or syncopal events since that which caused her most recent hospitalization. She has undergone dental extraction and been cleared for surgery.   I have personally reviewed the patient's recent echocardiograms and diagnostic cardiac catheterization. Transthoracic and transesophageal echocardiogram confirmed the presence of classical rheumatic mitral valve disease with severe mitral stenosis and mild mitral regurgitation. Left ventricular systolic function is preserved. The patient has moderate to severe pulmonary hypertension. There was trivial tricuspid regurgitation. The mitral valve is  severely thickened and heavily calcified, with anatomical features relatively unfavorable for repeat balloon mitral valvuloplasty.  Plan:  The patient was again counseled at length regarding the indications, risks, and potential benefits of mitral valve replacement, coronary artery bypass grafting, and Maze procedure. Expectations for her postoperative convalescence at been discussed. The rationale for elective surgery has been explained, including a comparison between surgery and continued medical therapy with close follow-up. We discussed the possibility of replacing the mitral valve using a mechanical prosthesis with the attendant need for long-term anticoagulation versus the alternative of replacing it using a bioprosthetic tissue valve with its potential for late structural valve deterioration and failure, depending upon the patient's longevity. The patient specifically requests that her valvebe replaced  using a mechanicalvalve. She understands and accepts all potential risks of surgery including but not limited to risk of death, stroke or other neurologic complication, myocardial infarction, congestive heart failure, respiratory failure, renal failure, bleeding requiring transfusion and/or reexploration, arrhythmia, infection or other wound complications, pneumonia, pleural and/or pericardial effusion, pulmonary embolus, aortic dissection or other major vascular complication, or delayed complications related to valve repair or replacement including but not limited to structural valve deterioration and failure, thrombosis, embolization, endocarditis, or paravalvular leak.  The relative risks and benefits of performing a maze procedure at the time of her surgery was discussed at length, including the expected likelihood of long term freedom from recurrent symptomatic atrial fibrillation and/or atrial flutter.  We plan to proceed with surgery on 03/28/2016. The patient has been instructed to stop taking  Coumadin later this week. She will begin Lovenox injections as bridging therapy once she has stopped taking Coumadin.     Valentina Gu. Roxy Manns, MD 05/17/2016 11:25 AM

## 2016-05-27 NOTE — Anesthesia Preprocedure Evaluation (Addendum)
Anesthesia Evaluation  Patient identified by MRN, date of birth, ID band Patient awake    Reviewed: Allergy & Precautions, NPO status , Patient's Chart, lab work & pertinent test results, reviewed documented beta blocker date and time   Airway Mallampati: II  TM Distance: >3 FB Neck ROM: Full    Dental  (+) Edentulous Upper, Edentulous Lower   Pulmonary sleep apnea , former smoker,    breath sounds clear to auscultation       Cardiovascular hypertension, Pt. on home beta blockers and Pt. on medications + CAD, + Peripheral Vascular Disease and +CHF  + dysrhythmias Atrial Fibrillation + Valvular Problems/Murmurs  Rhythm:Regular Rate:Bradycardia   The left ventricular systolic function is normal.  The left ventricular ejection fraction is 55-65% by visual estimate.  There is no aortic valve stenosis.  There is moderate mitral valve stenosis. Mean gradient 10 mm Hg. MV area 1.3 cm2  Mid Cx to Dist Cx lesion, 10 %stenosed.  Ost 1st Mrg to 1st Mrg lesion, 20 %stenosed.  Prox RCA lesion, 100 %stenosed. Left to right and right to right collaterals are present.  Hemodynamic findings consistent with moderate pulmonary hypertension and mitral valve stenosis.  LV end diastolic pressure is normal. Study Conclusions  - Left ventricle: Systolic function was normal. The estimated   ejection fraction was in the range of 55% to 60%. - Aortic valve: Left coronary cusp mobility was mildly restricted.   No evidence of vegetation. - Mitral valve: Mobility of the posterior leaflet was moderately   restricted. The findings are consistent with moderate to severe   stenosis. There was trivial regurgitation. Mean gradient (D): 11   mm Hg. Valve area by pressure half-time: 1.25 cm^2. Valve area by   continuity equation (using LVOT flow): 0.77 cm^2. - Left atrium: The atrium was dilated. No evidence of thrombus in   the appendage. No evidence of  thrombus in the atrial cavity or   appendage. - Right atrium: No evidence of thrombus in the atrial cavity or   appendage. - Tricuspid valve: No evidence of vegetation. - Pulmonic valve: No evidence of vegetation. - Superior vena cava: The study excluded a thrombus.     Neuro/Psych CVA negative neurological ROS  negative psych ROS   GI/Hepatic negative GI ROS, Neg liver ROS,   Endo/Other  negative endocrine ROS  Renal/GU negative Renal ROS  negative genitourinary   Musculoskeletal negative musculoskeletal ROS (+)   Abdominal   Peds  Hematology negative hematology ROS (+)   Anesthesia Other Findings Day of surgery medications reviewed with the patient.  Reproductive/Obstetrics negative OB ROS                           Lab Results  Component Value Date   WBC PENDING 05/28/2016   HGB 11.6 (L) 05/28/2016   HCT 37.4 05/28/2016   MCV 87.0 05/28/2016   PLT PENDING 05/28/2016   Lab Results  Component Value Date   CREATININE 0.92 04/02/2016   BUN 21 (H) 04/02/2016   NA 139 04/02/2016   K 3.8 04/02/2016   CL 106 04/02/2016   CO2 22 04/02/2016   Lab Results  Component Value Date   INR 2.2 05/18/2016   INR 2.1 05/11/2016   INR 2.0 (A) 05/04/2016   EKG: normal sinus rhythm, 1st degree AV block.  Echo 2017 - Left ventricle: Systolic function was normal. The estimated   ejection fraction was in the range of 55%  to 60%. - Aortic valve: Left coronary cusp mobility was mildly restricted.   No evidence of vegetation. - Mitral valve: Mobility of the posterior leaflet was moderately   restricted. The findings are consistent with moderate to severe   stenosis. There was trivial regurgitation. Mean gradient (D): 11   mm Hg. Valve area by pressure half-time: 1.25 cm^2. Valve area by   continuity equation (using LVOT flow): 0.77 cm^2. - Left atrium: The atrium was dilated. No evidence of thrombus in   the appendage. No evidence of thrombus in the  atrial cavity or   appendage. - Right atrium: No evidence of thrombus in the atrial cavity or   appendage. - Tricuspid valve: No evidence of vegetation. - Pulmonic valve: No evidence of vegetation. - Superior vena cava: The study excluded a thrombus.  Anesthesia Physical Anesthesia Plan  ASA: IV  Anesthesia Plan: General   Post-op Pain Management:    Induction: Intravenous  Airway Management Planned: Oral ETT  Additional Equipment: Arterial line, PA Cath, 3D TEE, Ultrasound Guidance Line Placement and CVP  Intra-op Plan:   Post-operative Plan: Post-operative intubation/ventilation  Informed Consent:   Plan Discussed with:   Anesthesia Plan Comments:        Anesthesia Quick Evaluation

## 2016-05-28 ENCOUNTER — Inpatient Hospital Stay (HOSPITAL_COMMUNITY): Payer: BC Managed Care – PPO

## 2016-05-28 ENCOUNTER — Inpatient Hospital Stay (HOSPITAL_COMMUNITY)
Admission: RE | Admit: 2016-05-28 | Discharge: 2016-06-04 | DRG: 219 | Disposition: A | Discharge status: Skilled Nursing Facility | Payer: BC Managed Care – PPO | Source: Ambulatory Visit | Attending: Thoracic Surgery (Cardiothoracic Vascular Surgery) | Admitting: Thoracic Surgery (Cardiothoracic Vascular Surgery)

## 2016-05-28 ENCOUNTER — Inpatient Hospital Stay (HOSPITAL_COMMUNITY): Payer: BC Managed Care – PPO | Admitting: Emergency Medicine

## 2016-05-28 ENCOUNTER — Encounter (HOSPITAL_COMMUNITY): Payer: Self-pay | Admitting: Thoracic Surgery (Cardiothoracic Vascular Surgery)

## 2016-05-28 ENCOUNTER — Encounter (HOSPITAL_COMMUNITY)
Admission: RE | Disposition: A | Discharge status: Skilled Nursing Facility | Payer: Self-pay | Source: Ambulatory Visit | Attending: Thoracic Surgery (Cardiothoracic Vascular Surgery)

## 2016-05-28 DIAGNOSIS — Z954 Presence of other heart-valve replacement: Secondary | ICD-10-CM

## 2016-05-28 DIAGNOSIS — I6521 Occlusion and stenosis of right carotid artery: Secondary | ICD-10-CM | POA: Diagnosis present

## 2016-05-28 DIAGNOSIS — I482 Chronic atrial fibrillation: Secondary | ICD-10-CM | POA: Diagnosis present

## 2016-05-28 DIAGNOSIS — Z8619 Personal history of other infectious and parasitic diseases: Secondary | ICD-10-CM

## 2016-05-28 DIAGNOSIS — I052 Rheumatic mitral stenosis with insufficiency: Secondary | ICD-10-CM | POA: Diagnosis present

## 2016-05-28 DIAGNOSIS — Z7901 Long term (current) use of anticoagulants: Secondary | ICD-10-CM | POA: Diagnosis not present

## 2016-05-28 DIAGNOSIS — E785 Hyperlipidemia, unspecified: Secondary | ICD-10-CM | POA: Diagnosis present

## 2016-05-28 DIAGNOSIS — I342 Nonrheumatic mitral (valve) stenosis: Secondary | ICD-10-CM

## 2016-05-28 DIAGNOSIS — N179 Acute kidney failure, unspecified: Secondary | ICD-10-CM | POA: Diagnosis not present

## 2016-05-28 DIAGNOSIS — J9811 Atelectasis: Secondary | ICD-10-CM

## 2016-05-28 DIAGNOSIS — Z8679 Personal history of other diseases of the circulatory system: Secondary | ICD-10-CM

## 2016-05-28 DIAGNOSIS — D62 Acute posthemorrhagic anemia: Secondary | ICD-10-CM | POA: Diagnosis not present

## 2016-05-28 DIAGNOSIS — G4733 Obstructive sleep apnea (adult) (pediatric): Secondary | ICD-10-CM | POA: Diagnosis present

## 2016-05-28 DIAGNOSIS — I481 Persistent atrial fibrillation: Secondary | ICD-10-CM | POA: Diagnosis not present

## 2016-05-28 DIAGNOSIS — I110 Hypertensive heart disease with heart failure: Secondary | ICD-10-CM | POA: Diagnosis present

## 2016-05-28 DIAGNOSIS — I2582 Chronic total occlusion of coronary artery: Secondary | ICD-10-CM | POA: Diagnosis present

## 2016-05-28 DIAGNOSIS — D72829 Elevated white blood cell count, unspecified: Secondary | ICD-10-CM | POA: Diagnosis not present

## 2016-05-28 DIAGNOSIS — Z823 Family history of stroke: Secondary | ICD-10-CM | POA: Diagnosis not present

## 2016-05-28 DIAGNOSIS — Z87891 Personal history of nicotine dependence: Secondary | ICD-10-CM | POA: Diagnosis not present

## 2016-05-28 DIAGNOSIS — I459 Conduction disorder, unspecified: Secondary | ICD-10-CM | POA: Diagnosis present

## 2016-05-28 DIAGNOSIS — I480 Paroxysmal atrial fibrillation: Secondary | ICD-10-CM | POA: Diagnosis present

## 2016-05-28 DIAGNOSIS — Z951 Presence of aortocoronary bypass graft: Secondary | ICD-10-CM

## 2016-05-28 DIAGNOSIS — I5033 Acute on chronic diastolic (congestive) heart failure: Secondary | ICD-10-CM | POA: Diagnosis present

## 2016-05-28 DIAGNOSIS — I5032 Chronic diastolic (congestive) heart failure: Secondary | ICD-10-CM | POA: Diagnosis present

## 2016-05-28 DIAGNOSIS — Z8249 Family history of ischemic heart disease and other diseases of the circulatory system: Secondary | ICD-10-CM

## 2016-05-28 DIAGNOSIS — E872 Acidosis: Secondary | ICD-10-CM | POA: Diagnosis not present

## 2016-05-28 DIAGNOSIS — I358 Other nonrheumatic aortic valve disorders: Secondary | ICD-10-CM | POA: Diagnosis present

## 2016-05-28 DIAGNOSIS — I441 Atrioventricular block, second degree: Secondary | ICD-10-CM | POA: Diagnosis present

## 2016-05-28 DIAGNOSIS — I050 Rheumatic mitral stenosis: Secondary | ICD-10-CM | POA: Diagnosis present

## 2016-05-28 DIAGNOSIS — R262 Difficulty in walking, not elsewhere classified: Secondary | ICD-10-CM

## 2016-05-28 DIAGNOSIS — I272 Pulmonary hypertension, unspecified: Secondary | ICD-10-CM | POA: Diagnosis present

## 2016-05-28 DIAGNOSIS — D6959 Other secondary thrombocytopenia: Secondary | ICD-10-CM | POA: Diagnosis not present

## 2016-05-28 DIAGNOSIS — Z9889 Other specified postprocedural states: Secondary | ICD-10-CM

## 2016-05-28 DIAGNOSIS — I10 Essential (primary) hypertension: Secondary | ICD-10-CM | POA: Diagnosis present

## 2016-05-28 DIAGNOSIS — Z79899 Other long term (current) drug therapy: Secondary | ICD-10-CM

## 2016-05-28 DIAGNOSIS — I051 Rheumatic mitral insufficiency: Secondary | ICD-10-CM | POA: Diagnosis present

## 2016-05-28 DIAGNOSIS — I2510 Atherosclerotic heart disease of native coronary artery without angina pectoris: Secondary | ICD-10-CM | POA: Diagnosis present

## 2016-05-28 DIAGNOSIS — Z8673 Personal history of transient ischemic attack (TIA), and cerebral infarction without residual deficits: Secondary | ICD-10-CM | POA: Diagnosis not present

## 2016-05-28 DIAGNOSIS — I4891 Unspecified atrial fibrillation: Secondary | ICD-10-CM

## 2016-05-28 HISTORY — PX: TEE WITHOUT CARDIOVERSION: SHX5443

## 2016-05-28 HISTORY — DX: Presence of other heart-valve replacement: Z95.4

## 2016-05-28 HISTORY — PX: CORONARY ARTERY BYPASS GRAFT: SHX141

## 2016-05-28 HISTORY — PX: MITRAL VALVE REPLACEMENT: SHX147

## 2016-05-28 HISTORY — DX: Atherosclerotic heart disease of native coronary artery without angina pectoris: I25.10

## 2016-05-28 HISTORY — DX: Presence of aortocoronary bypass graft: Z95.1

## 2016-05-28 HISTORY — DX: Rheumatic mitral insufficiency: I05.1

## 2016-05-28 HISTORY — DX: Pulmonary hypertension, unspecified: I27.20

## 2016-05-28 HISTORY — PX: MAZE: SHX5063

## 2016-05-28 LAB — COMPREHENSIVE METABOLIC PANEL
ALK PHOS: 70 U/L (ref 38–126)
ALT: 30 U/L (ref 14–54)
AST: 46 U/L — ABNORMAL HIGH (ref 15–41)
Albumin: 4.2 g/dL (ref 3.5–5.0)
Anion gap: 12 (ref 5–15)
BUN: 10 mg/dL (ref 6–20)
CALCIUM: 9.5 mg/dL (ref 8.9–10.3)
CO2: 21 mmol/L — AB (ref 22–32)
CREATININE: 0.89 mg/dL (ref 0.44–1.00)
Chloride: 108 mmol/L (ref 101–111)
GFR calc non Af Amer: >60 mL/min (ref >60)
GLUCOSE: 80 mg/dL (ref 65–99)
Potassium: 3.3 mmol/L — ABNORMAL LOW (ref 3.5–5.1)
SODIUM: 141 mmol/L (ref 135–145)
Total Bilirubin: 1.4 mg/dL — ABNORMAL HIGH (ref 0.3–1.2)
Total Protein: 8 g/dL (ref 6.5–8.1)

## 2016-05-28 LAB — GLUCOSE, CAPILLARY
GLUCOSE-CAPILLARY: 107 mg/dL — AB (ref 65–99)
GLUCOSE-CAPILLARY: 162 mg/dL — AB (ref 65–99)
Glucose-Capillary: 95 mg/dL (ref 65–99)
Glucose-Capillary: 101 mg/dL — ABNORMAL HIGH (ref 65–99)
Glucose-Capillary: 181 mg/dL — ABNORMAL HIGH (ref 65–99)

## 2016-05-28 LAB — CBC
HCT: 37.4 % (ref 36.0–46.0)
HCT: 25.8 % — ABNORMAL LOW (ref 36.0–46.0)
HEMATOCRIT: 37.2 % (ref 36.0–46.0)
HEMOGLOBIN: 11.6 g/dL — AB (ref 12.0–15.0)
HEMOGLOBIN: 11.9 g/dL — AB (ref 12.0–15.0)
HEMOGLOBIN: 8.3 g/dL — AB (ref 12.0–15.0)
MCH: 27 pg (ref 26.0–34.0)
MCH: 27 pg (ref 26.0–34.0)
MCH: 27.1 pg (ref 26.0–34.0)
MCHC: 31 g/dL (ref 30.0–36.0)
MCHC: 32 g/dL (ref 30.0–36.0)
MCHC: 32.2 g/dL (ref 30.0–36.0)
MCV: 87 fL (ref 78.0–100.0)
MCV: 84.5 fL (ref 78.0–100.0)
MCV: 84.3 fL (ref 78.0–100.0)
PLATELETS: 100 10*3/uL — AB (ref 150–400)
Platelets: 99 10*3/uL — ABNORMAL LOW (ref 150–400)
Platelets: 161 10*3/uL (ref 150–400)
RBC: 4.3 MIL/uL (ref 3.87–5.11)
RBC: 4.4 MIL/uL (ref 3.87–5.11)
RBC: 3.06 MIL/uL — AB (ref 3.87–5.11)
RDW: 15.6 % — ABNORMAL HIGH (ref 11.5–15.5)
RDW: 15.9 % — ABNORMAL HIGH (ref 11.5–15.5)
RDW: 16.5 % — ABNORMAL HIGH (ref 11.5–15.5)
WBC: 3.1 10*3/uL — AB (ref 4.0–10.5)
WBC: 9 10*3/uL (ref 4.0–10.5)
WBC: 9.2 10*3/uL (ref 4.0–10.5)

## 2016-05-28 LAB — BLOOD GAS, ARTERIAL
ACID-BASE EXCESS: 2.2 mmol/L — AB (ref 0.0–2.0)
BICARBONATE: 26.7 mmol/L (ref 20.0–28.0)
O2 CONTENT: 2 L/min
O2 SAT: 97.9 %
PH ART: 7.389 (ref 7.350–7.450)
Patient temperature: 98.6
pCO2 arterial: 45.3 mmHg (ref 32.0–48.0)
pO2, Arterial: 114 mmHg — ABNORMAL HIGH (ref 83.0–108.0)

## 2016-05-28 LAB — POCT I-STAT, CHEM 8
BUN: 7 mg/dL (ref 6–20)
BUN: 6 mg/dL (ref 6–20)
BUN: 6 mg/dL (ref 6–20)
BUN: 6 mg/dL (ref 6–20)
BUN: 6 mg/dL (ref 6–20)
BUN: 6 mg/dL (ref 6–20)
BUN: 5 mg/dL — AB (ref 6–20)
CALCIUM ION: 1.16 mmol/L (ref 1.15–1.40)
CHLORIDE: 105 mmol/L (ref 101–111)
CHLORIDE: 106 mmol/L (ref 101–111)
CHLORIDE: 105 mmol/L (ref 101–111)
CHLORIDE: 109 mmol/L (ref 101–111)
CREATININE: 0.4 mg/dL — AB (ref 0.44–1.00)
CREATININE: 0.5 mg/dL (ref 0.44–1.00)
CREATININE: 0.5 mg/dL (ref 0.44–1.00)
Calcium, Ion: 1.11 mmol/L — ABNORMAL LOW (ref 1.15–1.40)
Calcium, Ion: 1.08 mmol/L — ABNORMAL LOW (ref 1.15–1.40)
Calcium, Ion: 1.07 mmol/L — ABNORMAL LOW (ref 1.15–1.40)
Calcium, Ion: 1.11 mmol/L — ABNORMAL LOW (ref 1.15–1.40)
Calcium, Ion: 1.28 mmol/L (ref 1.15–1.40)
Calcium, Ion: 1.18 mmol/L (ref 1.15–1.40)
Chloride: 109 mmol/L (ref 101–111)
Chloride: 105 mmol/L (ref 101–111)
Chloride: 104 mmol/L (ref 101–111)
Creatinine, Ser: 0.4 mg/dL — ABNORMAL LOW (ref 0.44–1.00)
Creatinine, Ser: 0.5 mg/dL (ref 0.44–1.00)
Creatinine, Ser: 0.4 mg/dL — ABNORMAL LOW (ref 0.44–1.00)
Creatinine, Ser: 0.6 mg/dL (ref 0.44–1.00)
GLUCOSE: 130 mg/dL — AB (ref 65–99)
Glucose, Bld: 81 mg/dL (ref 65–99)
Glucose, Bld: 94 mg/dL (ref 65–99)
Glucose, Bld: 132 mg/dL — ABNORMAL HIGH (ref 65–99)
Glucose, Bld: 133 mg/dL — ABNORMAL HIGH (ref 65–99)
Glucose, Bld: 177 mg/dL — ABNORMAL HIGH (ref 65–99)
Glucose, Bld: 215 mg/dL — ABNORMAL HIGH (ref 65–99)
HCT: 24 % — ABNORMAL LOW (ref 36.0–46.0)
HCT: 26 % — ABNORMAL LOW (ref 36.0–46.0)
HEMATOCRIT: 30 % — AB (ref 36.0–46.0)
HEMATOCRIT: 27 % — AB (ref 36.0–46.0)
HEMATOCRIT: 24 % — AB (ref 36.0–46.0)
HEMATOCRIT: 25 % — AB (ref 36.0–46.0)
HEMATOCRIT: 26 % — AB (ref 36.0–46.0)
HEMOGLOBIN: 8.2 g/dL — AB (ref 12.0–15.0)
HEMOGLOBIN: 8.5 g/dL — AB (ref 12.0–15.0)
Hemoglobin: 10.2 g/dL — ABNORMAL LOW (ref 12.0–15.0)
Hemoglobin: 9.2 g/dL — ABNORMAL LOW (ref 12.0–15.0)
Hemoglobin: 8.2 g/dL — ABNORMAL LOW (ref 12.0–15.0)
Hemoglobin: 8.8 g/dL — ABNORMAL LOW (ref 12.0–15.0)
Hemoglobin: 8.8 g/dL — ABNORMAL LOW (ref 12.0–15.0)
POTASSIUM: 2.6 mmol/L — AB (ref 3.5–5.1)
POTASSIUM: 3.4 mmol/L — AB (ref 3.5–5.1)
POTASSIUM: 3.2 mmol/L — AB (ref 3.5–5.1)
POTASSIUM: 3.7 mmol/L (ref 3.5–5.1)
POTASSIUM: 3.6 mmol/L (ref 3.5–5.1)
POTASSIUM: 3.6 mmol/L (ref 3.5–5.1)
Potassium: 2.9 mmol/L — ABNORMAL LOW (ref 3.5–5.1)
SODIUM: 143 mmol/L (ref 135–145)
SODIUM: 144 mmol/L (ref 135–145)
SODIUM: 142 mmol/L (ref 135–145)
SODIUM: 146 mmol/L — AB (ref 135–145)
Sodium: 145 mmol/L (ref 135–145)
Sodium: 144 mmol/L (ref 135–145)
Sodium: 145 mmol/L (ref 135–145)
TCO2: 28 mmol/L (ref 0–100)
TCO2: 27 mmol/L (ref 0–100)
TCO2: 29 mmol/L (ref 0–100)
TCO2: 27 mmol/L (ref 0–100)
TCO2: 28 mmol/L (ref 0–100)
TCO2: 29 mmol/L (ref 0–100)
TCO2: 22 mmol/L (ref 0–100)

## 2016-05-28 LAB — PROTIME-INR
INR: 1.27
INR: 1.6
PROTHROMBIN TIME: 19.2 s — AB (ref 11.4–15.2)
Prothrombin Time: 15.9 seconds — ABNORMAL HIGH (ref 11.4–15.2)

## 2016-05-28 LAB — POCT I-STAT 4, (NA,K, GLUC, HGB,HCT)
Glucose, Bld: 113 mg/dL — ABNORMAL HIGH (ref 65–99)
HCT: 36 % (ref 36.0–46.0)
Hemoglobin: 12.2 g/dL (ref 12.0–15.0)
POTASSIUM: 3 mmol/L — AB (ref 3.5–5.1)
Sodium: 146 mmol/L — ABNORMAL HIGH (ref 135–145)

## 2016-05-28 LAB — POCT I-STAT 3, ART BLOOD GAS (G3+)
ACID-BASE EXCESS: 2 mmol/L (ref 0.0–2.0)
Acid-Base Excess: 1 mmol/L (ref 0.0–2.0)
BICARBONATE: 25.3 mmol/L (ref 20.0–28.0)
BICARBONATE: 24.8 mmol/L (ref 20.0–28.0)
O2 Saturation: 100 %
O2 Saturation: 100 %
PCO2 ART: 35.7 mmHg (ref 32.0–48.0)
PH ART: 7.447 (ref 7.350–7.450)
Patient temperature: 36.3
TCO2: 26 mmol/L (ref 0–100)
TCO2: 26 mmol/L (ref 0–100)
pCO2 arterial: 32.8 mmHg (ref 32.0–48.0)
pH, Arterial: 7.495 — ABNORMAL HIGH (ref 7.350–7.450)
pO2, Arterial: 305 mmHg — ABNORMAL HIGH (ref 83.0–108.0)
pO2, Arterial: 190 mmHg — ABNORMAL HIGH (ref 83.0–108.0)

## 2016-05-28 LAB — URINALYSIS, ROUTINE W REFLEX MICROSCOPIC
BILIRUBIN URINE: NEGATIVE
Glucose, UA: NEGATIVE mg/dL
HGB URINE DIPSTICK: NEGATIVE
Ketones, ur: NEGATIVE mg/dL
Leukocytes, UA: NEGATIVE
Nitrite: NEGATIVE
Protein, ur: NEGATIVE mg/dL
SPECIFIC GRAVITY, URINE: 1.009 (ref 1.005–1.030)
pH: 7 (ref 5.0–8.0)

## 2016-05-28 LAB — BASIC METABOLIC PANEL
ANION GAP: 12 (ref 5–15)
BUN: 6 mg/dL (ref 4–21)
BUN: 6 mg/dL (ref 6–20)
CHLORIDE: 110 mmol/L (ref 101–111)
CO2: 21 mmol/L — ABNORMAL LOW (ref 22–32)
CREATININE: 0.4 mg/dL — AB (ref 0.5–1.1)
Calcium: 8.3 mg/dL — ABNORMAL LOW (ref 8.9–10.3)
Creatinine, Ser: 0.78 mg/dL (ref 0.44–1.00)
GFR calc Af Amer: >60 mL/min (ref >60)
GFR calc non Af Amer: >60 mL/min (ref >60)
Glucose, Bld: 213 mg/dL — ABNORMAL HIGH (ref 65–99)
Glucose: 90 mg/dL
POTASSIUM: 6.2 mmol/L — AB (ref 3.4–5.3)
POTASSIUM: 3.7 mmol/L (ref 3.5–5.1)
SODIUM: 143 mmol/L (ref 135–145)
Sodium: 142 mmol/L (ref 137–147)

## 2016-05-28 LAB — HEMOGLOBIN AND HEMATOCRIT, BLOOD
HCT: 22.6 % — ABNORMAL LOW (ref 36.0–46.0)
HEMOGLOBIN: 7 g/dL — AB (ref 12.0–15.0)

## 2016-05-28 LAB — FIBRINOGEN: Fibrinogen: 216 mg/dL (ref 210–475)

## 2016-05-28 LAB — APTT
aPTT: 27 seconds (ref 24–36)
aPTT: 42 seconds — ABNORMAL HIGH (ref 24–36)

## 2016-05-28 LAB — PLATELET COUNT: Platelets: 48 10*3/uL — ABNORMAL LOW (ref 150–400)

## 2016-05-28 LAB — PREPARE RBC (CROSSMATCH)

## 2016-05-28 LAB — MAGNESIUM: MAGNESIUM: 2.7 mg/dL — AB (ref 1.7–2.4)

## 2016-05-28 SURGERY — REPLACEMENT, MITRAL VALVE
Anesthesia: General | Site: Chest

## 2016-05-28 MED ORDER — CHLORHEXIDINE GLUCONATE 0.12% ORAL RINSE (MEDLINE KIT)
15.0000 mL | Freq: Two times a day (BID) | OROMUCOSAL | Status: DC
  Administered 2016-05-28: 15 mL via OROMUCOSAL

## 2016-05-28 MED ORDER — LIDOCAINE 2% (20 MG/ML) 5 ML SYRINGE
INTRAMUSCULAR | Status: AC
  Filled 2016-05-28: qty 5

## 2016-05-28 MED ORDER — SODIUM CHLORIDE 0.9 % IJ SOLN
INTRAMUSCULAR | Status: DC | PRN
  Administered 2016-05-28 (×3): 4 mL via TOPICAL

## 2016-05-28 MED ORDER — CHLORHEXIDINE GLUCONATE 0.12 % MT SOLN
15.0000 mL | OROMUCOSAL | Status: AC
  Administered 2016-05-28: 15 mL via OROMUCOSAL

## 2016-05-28 MED ORDER — SODIUM CHLORIDE 0.9 % IV SOLN
INTRAVENOUS | Status: DC
  Administered 2016-05-29: 03:00:00 via INTRAVENOUS

## 2016-05-28 MED ORDER — FENTANYL CITRATE (PF) 250 MCG/5ML IJ SOLN
INTRAMUSCULAR | Status: DC | PRN
  Administered 2016-05-28 (×2): 50 ug via INTRAVENOUS
  Administered 2016-05-28: 200 ug via INTRAVENOUS
  Administered 2016-05-28 (×3): 250 ug via INTRAVENOUS
  Administered 2016-05-28: 150 ug via INTRAVENOUS
  Administered 2016-05-28: 50 ug via INTRAVENOUS

## 2016-05-28 MED ORDER — SODIUM CHLORIDE 0.9 % IV SOLN
INTRAVENOUS | Status: DC
  Administered 2016-05-28: 17:00:00 via INTRAVENOUS

## 2016-05-28 MED ORDER — ASPIRIN 81 MG PO CHEW: 324.0000 mg | CHEWABLE_TABLET | Freq: Every day | ORAL | Status: DC

## 2016-05-28 MED ORDER — LACTATED RINGERS IV SOLN
INTRAVENOUS | Status: DC
  Administered 2016-05-28: 20:00:00 via INTRAVENOUS

## 2016-05-28 MED ORDER — SODIUM CHLORIDE 0.9 % IV SOLN
0.0000 ug/min | INTRAVENOUS | Status: DC
  Filled 2016-05-28 (×3): qty 2

## 2016-05-28 MED ORDER — MILRINONE LACTATE IN DEXTROSE 20-5 MG/100ML-% IV SOLN
0.0000 ug/kg/min | INTRAVENOUS | Status: DC
  Administered 2016-05-28: 0.375 ug/kg/min via INTRAVENOUS
  Filled 2016-05-28: qty 100

## 2016-05-28 MED ORDER — OXYCODONE HCL 5 MG PO TABS: 5.0000 mg | ORAL_TABLET | ORAL | Status: DC | PRN

## 2016-05-28 MED ORDER — SODIUM CHLORIDE 0.9% FLUSH: 3.0000 mL | INTRAVENOUS | Status: DC | PRN

## 2016-05-28 MED ORDER — HEPARIN SODIUM (PORCINE) 1000 UNIT/ML IJ SOLN
INTRAMUSCULAR | Status: DC | PRN
  Administered 2016-05-28: 16000 [IU] via INTRAVENOUS

## 2016-05-28 MED ORDER — ARTIFICIAL TEARS OP OINT
TOPICAL_OINTMENT | OPHTHALMIC | Status: DC | PRN
  Administered 2016-05-28: 1 via OPHTHALMIC

## 2016-05-28 MED ORDER — PANTOPRAZOLE SODIUM 40 MG PO TBEC
40.0000 mg | DELAYED_RELEASE_TABLET | Freq: Every day | ORAL | Status: DC
  Administered 2016-05-30 – 2016-06-04 (×6): 40 mg via ORAL
  Filled 2016-05-28 (×6): qty 1

## 2016-05-28 MED ORDER — NOREPINEPHRINE BITARTRATE 1 MG/ML IV SOLN
0.0000 ug/min | INTRAVENOUS | Status: DC
  Filled 2016-05-28: qty 4

## 2016-05-28 MED ORDER — TRAMADOL HCL 50 MG PO TABS
50.0000 mg | ORAL_TABLET | ORAL | Status: DC | PRN
  Administered 2016-05-31 – 2016-06-03 (×8): 50 mg via ORAL
  Filled 2016-05-28 (×8): qty 1

## 2016-05-28 MED ORDER — 0.9 % SODIUM CHLORIDE (POUR BTL) OPTIME
TOPICAL | Status: DC | PRN
  Administered 2016-05-28: 6000 mL

## 2016-05-28 MED ORDER — DEXTROSE 5 % IV SOLN
1.5000 g | Freq: Two times a day (BID) | INTRAVENOUS | Status: AC
  Administered 2016-05-28 – 2016-05-30 (×4): 1.5 g via INTRAVENOUS
  Filled 2016-05-28 (×5): qty 1.5

## 2016-05-28 MED ORDER — ASPIRIN EC 325 MG PO TBEC: 325.0000 mg | DELAYED_RELEASE_TABLET | Freq: Every day | ORAL | Status: DC

## 2016-05-28 MED ORDER — ONDANSETRON HCL 4 MG/2ML IJ SOLN
4.0000 mg | Freq: Four times a day (QID) | INTRAMUSCULAR | Status: DC | PRN
  Administered 2016-05-29 – 2016-05-31 (×3): 4 mg via INTRAVENOUS
  Filled 2016-05-28 (×3): qty 2

## 2016-05-28 MED ORDER — LACTATED RINGERS IV SOLN: 500.0000 mL | Freq: Once | INTRAVENOUS | Status: DC | PRN

## 2016-05-28 MED ORDER — INSULIN ASPART 100 UNIT/ML ~~LOC~~ SOLN: 0.0000 [IU] | SUBCUTANEOUS | Status: DC

## 2016-05-28 MED ORDER — PROTAMINE SULFATE 10 MG/ML IV SOLN
INTRAVENOUS | Status: AC
  Filled 2016-05-28: qty 25

## 2016-05-28 MED ORDER — MORPHINE SULFATE (PF) 2 MG/ML IV SOLN
1.0000 mg | INTRAVENOUS | Status: DC | PRN
  Administered 2016-05-29 (×2): 1 mg via INTRAVENOUS
  Filled 2016-05-28: qty 1

## 2016-05-28 MED ORDER — SODIUM CHLORIDE 0.9 % IV SOLN
30.0000 meq | Freq: Once | INTRAVENOUS | Status: AC
  Administered 2016-05-28: 30 meq via INTRAVENOUS
  Filled 2016-05-28: qty 15

## 2016-05-28 MED ORDER — CHLORHEXIDINE GLUCONATE 0.12 % MT SOLN
15.0000 mL | Freq: Once | OROMUCOSAL | Status: AC
  Administered 2016-05-28: 15 mL via OROMUCOSAL
  Filled 2016-05-28: qty 15

## 2016-05-28 MED ORDER — ACETAMINOPHEN 160 MG/5ML PO SOLN: 1000.0000 mg | Freq: Four times a day (QID) | ORAL | Status: DC

## 2016-05-28 MED ORDER — VANCOMYCIN HCL IN DEXTROSE 1-5 GM/200ML-% IV SOLN
1000.0000 mg | Freq: Once | INTRAVENOUS | Status: AC
  Administered 2016-05-28: 1000 mg via INTRAVENOUS
  Filled 2016-05-28: qty 200

## 2016-05-28 MED ORDER — BISACODYL 5 MG PO TBEC
10.0000 mg | DELAYED_RELEASE_TABLET | Freq: Every day | ORAL | Status: DC
  Administered 2016-05-31 – 2016-06-03 (×4): 10 mg via ORAL
  Filled 2016-05-28 (×5): qty 2

## 2016-05-28 MED ORDER — VECURONIUM BROMIDE 10 MG IV SOLR
INTRAVENOUS | Status: DC | PRN
  Administered 2016-05-28: 3 mg via INTRAVENOUS
  Administered 2016-05-28 (×2): 5 mg via INTRAVENOUS

## 2016-05-28 MED ORDER — FAMOTIDINE IN NACL 20-0.9 MG/50ML-% IV SOLN
20.0000 mg | Freq: Two times a day (BID) | INTRAVENOUS | Status: DC
  Administered 2016-05-28: 20 mg via INTRAVENOUS

## 2016-05-28 MED ORDER — MILRINONE LACTATE IN DEXTROSE 20-5 MG/100ML-% IV SOLN
0.3750 ug/kg/min | INTRAVENOUS | Status: AC
  Administered 2016-05-28: 0.375 ug/kg/min via INTRAVENOUS
  Filled 2016-05-28: qty 100

## 2016-05-28 MED ORDER — INSULIN REGULAR HUMAN 100 UNIT/ML IJ SOLN
INTRAMUSCULAR | Status: DC
  Administered 2016-05-28: 19:00:00 via INTRAVENOUS
  Filled 2016-05-28 (×2): qty 2.5

## 2016-05-28 MED ORDER — SODIUM CHLORIDE 0.9 % IV SOLN: 250.0000 mL | INTRAVENOUS | Status: DC

## 2016-05-28 MED ORDER — MIDAZOLAM HCL 5 MG/5ML IJ SOLN
INTRAMUSCULAR | Status: DC | PRN
  Administered 2016-05-28 (×2): 3 mg via INTRAVENOUS
  Administered 2016-05-28 (×2): 1 mg via INTRAVENOUS
  Administered 2016-05-28: 2 mg via INTRAVENOUS

## 2016-05-28 MED ORDER — SODIUM CHLORIDE 0.9 % IV SOLN: INTRAVENOUS | Status: DC

## 2016-05-28 MED ORDER — DOCUSATE SODIUM 100 MG PO CAPS
200.0000 mg | ORAL_CAPSULE | Freq: Every day | ORAL | Status: DC
Start: 2016-05-29 — End: 2016-06-04
  Administered 2016-05-31 – 2016-06-04 (×5): 200 mg via ORAL
  Filled 2016-05-28 (×5): qty 2

## 2016-05-28 MED ORDER — METOPROLOL TARTRATE 12.5 MG HALF TABLET
12.5000 mg | ORAL_TABLET | Freq: Once | ORAL | Status: AC
  Administered 2016-05-28: 12.5 mg via ORAL
  Filled 2016-05-28: qty 1

## 2016-05-28 MED ORDER — PROPOFOL 10 MG/ML IV BOLUS
INTRAVENOUS | Status: DC | PRN
  Administered 2016-05-28: 110 mg via INTRAVENOUS

## 2016-05-28 MED ORDER — PROTAMINE SULFATE 10 MG/ML IV SOLN
INTRAVENOUS | Status: DC | PRN
  Administered 2016-05-28: 165 mg via INTRAVENOUS
  Administered 2016-05-28: 25 mg via INTRAVENOUS

## 2016-05-28 MED ORDER — VECURONIUM BROMIDE 10 MG IV SOLR
INTRAVENOUS | Status: AC
  Filled 2016-05-28: qty 10

## 2016-05-28 MED ORDER — LACTATED RINGERS IV SOLN
INTRAVENOUS | Status: DC | PRN
  Administered 2016-05-28: 09:00:00 via INTRAVENOUS

## 2016-05-28 MED ORDER — NITROGLYCERIN IN D5W 200-5 MCG/ML-% IV SOLN: 0.0000 ug/min | INTRAVENOUS | Status: DC

## 2016-05-28 MED ORDER — ALBUMIN HUMAN 5 % IV SOLN
250.0000 mL | INTRAVENOUS | Status: AC | PRN
  Administered 2016-05-28 (×4): 250 mL via INTRAVENOUS
  Filled 2016-05-28 (×2): qty 250

## 2016-05-28 MED ORDER — LACTATED RINGERS IV SOLN: INTRAVENOUS | Status: DC

## 2016-05-28 MED ORDER — MIDAZOLAM HCL 10 MG/2ML IJ SOLN
INTRAMUSCULAR | Status: AC
  Filled 2016-05-28: qty 2

## 2016-05-28 MED ORDER — SODIUM CHLORIDE 0.9 % IJ SOLN
INTRAMUSCULAR | Status: AC
  Filled 2016-05-28: qty 40

## 2016-05-28 MED ORDER — CALCIUM CHLORIDE 10 % IV SOLN
INTRAVENOUS | Status: DC | PRN
  Administered 2016-05-28 (×2): .25 g via INTRAVENOUS

## 2016-05-28 MED ORDER — METOPROLOL TARTRATE 5 MG/5ML IV SOLN: 2.5000 mg | INTRAVENOUS | Status: DC | PRN

## 2016-05-28 MED ORDER — ACETAMINOPHEN 160 MG/5ML PO SOLN: 650.0000 mg | Freq: Once | ORAL | Status: AC

## 2016-05-28 MED ORDER — ORAL CARE MOUTH RINSE
15.0000 mL | OROMUCOSAL | Status: DC
  Administered 2016-05-28 – 2016-05-29 (×2): 15 mL via OROMUCOSAL

## 2016-05-28 MED ORDER — CHLORHEXIDINE GLUCONATE 4 % EX LIQD: 30.0000 mL | CUTANEOUS | Status: DC

## 2016-05-28 MED ORDER — SODIUM CHLORIDE 0.9 % IV SOLN
Freq: Once | INTRAVENOUS | Status: AC
  Administered 2016-05-28: 14:00:00 via INTRAVENOUS

## 2016-05-28 MED ORDER — HEPARIN SODIUM (PORCINE) 1000 UNIT/ML IJ SOLN
INTRAMUSCULAR | Status: AC
  Filled 2016-05-28: qty 1

## 2016-05-28 MED ORDER — SODIUM CHLORIDE 0.9% FLUSH
3.0000 mL | Freq: Two times a day (BID) | INTRAVENOUS | Status: DC
  Administered 2016-05-29 – 2016-06-03 (×4): 3 mL via INTRAVENOUS

## 2016-05-28 MED ORDER — INSULIN REGULAR BOLUS VIA INFUSION
0.0000 [IU] | Freq: Three times a day (TID) | INTRAVENOUS | Status: DC
  Filled 2016-05-28: qty 10

## 2016-05-28 MED ORDER — BISACODYL 10 MG RE SUPP
10.0000 mg | Freq: Every day | RECTAL | Status: DC
  Administered 2016-06-04: 10 mg via RECTAL
  Filled 2016-05-28: qty 1

## 2016-05-28 MED ORDER — MAGNESIUM SULFATE 4 GM/100ML IV SOLN
4.0000 g | Freq: Once | INTRAVENOUS | Status: AC
  Administered 2016-05-28: 4 g via INTRAVENOUS
  Filled 2016-05-28: qty 100

## 2016-05-28 MED ORDER — MIDAZOLAM HCL 2 MG/2ML IJ SOLN
INTRAMUSCULAR | Status: AC
  Filled 2016-05-28: qty 2

## 2016-05-28 MED ORDER — SODIUM CHLORIDE 0.45 % IV SOLN
INTRAVENOUS | Status: DC | PRN
  Administered 2016-05-28: 17:00:00 via INTRAVENOUS

## 2016-05-28 MED ORDER — ACETAMINOPHEN 650 MG RE SUPP
650.0000 mg | Freq: Once | RECTAL | Status: AC
  Administered 2016-05-28: 650 mg via RECTAL

## 2016-05-28 MED ORDER — LACTATED RINGERS IV SOLN
INTRAVENOUS | Status: DC | PRN
  Administered 2016-05-28 (×2): via INTRAVENOUS

## 2016-05-28 MED ORDER — MORPHINE SULFATE (PF) 2 MG/ML IV SOLN
1.0000 mg | INTRAVENOUS | Status: DC | PRN
  Administered 2016-05-28: 2 mg via INTRAVENOUS
  Administered 2016-05-29 (×2): 1 mg via INTRAVENOUS
  Filled 2016-05-28 (×2): qty 1

## 2016-05-28 MED ORDER — PHENYLEPHRINE 40 MCG/ML (10ML) SYRINGE FOR IV PUSH (FOR BLOOD PRESSURE SUPPORT)
PREFILLED_SYRINGE | INTRAVENOUS | Status: AC
  Filled 2016-05-28: qty 10

## 2016-05-28 MED ORDER — ROCURONIUM BROMIDE 10 MG/ML (PF) SYRINGE
PREFILLED_SYRINGE | INTRAVENOUS | Status: DC | PRN
  Administered 2016-05-28: 50 mg via INTRAVENOUS

## 2016-05-28 MED ORDER — MIDAZOLAM HCL 2 MG/2ML IJ SOLN: 2.0000 mg | INTRAMUSCULAR | Status: DC | PRN

## 2016-05-28 MED ORDER — DEXMEDETOMIDINE HCL IN NACL 200 MCG/50ML IV SOLN
0.0000 ug/kg/h | INTRAVENOUS | Status: DC
Start: 2016-05-28 — End: 2016-05-29
  Administered 2016-05-28: 0.7 ug/kg/h via INTRAVENOUS
  Filled 2016-05-28 (×2): qty 50

## 2016-05-28 MED ORDER — SODIUM CHLORIDE 0.9 % IR SOLN
Status: DC | PRN
  Administered 2016-05-28: 3000 mL

## 2016-05-28 MED ORDER — HEMOSTATIC AGENTS (NO CHARGE) OPTIME
TOPICAL | Status: DC | PRN
  Administered 2016-05-28: 1 via TOPICAL

## 2016-05-28 MED ORDER — ALBUMIN HUMAN 5 % IV SOLN
INTRAVENOUS | Status: DC | PRN
  Administered 2016-05-28: 15:00:00 via INTRAVENOUS

## 2016-05-28 MED ORDER — ACETAMINOPHEN 500 MG PO TABS
1000.0000 mg | ORAL_TABLET | Freq: Four times a day (QID) | ORAL | Status: AC
  Administered 2016-05-29 – 2016-05-31 (×8): 1000 mg via ORAL
  Filled 2016-05-28 (×12): qty 2

## 2016-05-28 MED ORDER — CALCIUM CHLORIDE 10 % IV SOLN
INTRAVENOUS | Status: AC
  Filled 2016-05-28: qty 10

## 2016-05-28 MED ORDER — FENTANYL CITRATE (PF) 250 MCG/5ML IJ SOLN
INTRAMUSCULAR | Status: AC
  Filled 2016-05-28: qty 25

## 2016-05-28 MED FILL — Potassium Chloride Inj 2 mEq/ML: INTRAVENOUS | Qty: 40 | Status: AC

## 2016-05-28 MED FILL — Heparin Sodium (Porcine) Inj 1000 Unit/ML: INTRAMUSCULAR | Qty: 30 | Status: AC

## 2016-05-28 MED FILL — Magnesium Sulfate Inj 50%: INTRAMUSCULAR | Qty: 10 | Status: AC

## 2016-05-28 SURGICAL SUPPLY — 156 items
ADAPTER CARDIO PERF ANTE/RETRO (ADAPTER) ×6 IMPLANT
APPLICATOR COTTON TIP 6IN STRL (MISCELLANEOUS) IMPLANT
ARTICLIP LAA PROCLIP II 45 (Clip) ×3 IMPLANT
ATTRACTOMAT 16X20 MAGNETIC DRP (DRAPES) IMPLANT
BAG DECANTER FOR FLEXI CONT (MISCELLANEOUS) ×6 IMPLANT
BANDAGE ACE 4X5 VEL STRL LF (GAUZE/BANDAGES/DRESSINGS) IMPLANT
BANDAGE ACE 6X5 VEL STRL LF (GAUZE/BANDAGES/DRESSINGS) IMPLANT
BANDAGE ELASTIC 4 VELCRO ST LF (GAUZE/BANDAGES/DRESSINGS) ×3 IMPLANT
BANDAGE ELASTIC 6 VELCRO ST LF (GAUZE/BANDAGES/DRESSINGS) ×3 IMPLANT
BASKET HEART (ORDER IN 25'S) (MISCELLANEOUS) ×1
BASKET HEART (ORDER IN 25S) (MISCELLANEOUS) ×2 IMPLANT
BLADE STERNUM SYSTEM 6 (BLADE) ×3 IMPLANT
BLADE SURG 11 STRL SS (BLADE) ×6 IMPLANT
BLADE SURG ROTATE 9660 (MISCELLANEOUS) IMPLANT
BNDG GAUZE ELAST 4 BULKY (GAUZE/BANDAGES/DRESSINGS) ×3 IMPLANT
BOOT SUTURE AID YELLOW STND (SUTURE) ×3 IMPLANT
CANISTER SUCTION 2500CC (MISCELLANEOUS) ×6 IMPLANT
CANN PRFSN 3/8X14X24FR PCFC (MISCELLANEOUS)
CANN PRFSN 3/8XCNCT ST RT ANG (MISCELLANEOUS)
CANNULA EZ GLIDE AORTIC 21FR (CANNULA) ×9 IMPLANT
CANNULA FEM VENOUS REMOTE 22FR (CANNULA) ×3 IMPLANT
CANNULA GUNDRY RCSP 15FR (MISCELLANEOUS) ×6 IMPLANT
CANNULA PRFSN 3/8X14X24FR PCFC (MISCELLANEOUS) IMPLANT
CANNULA PRFSN 3/8XCNCT RT ANG (MISCELLANEOUS) IMPLANT
CANNULA SUMP PERICARDIAL (CANNULA) ×3 IMPLANT
CANNULA VEN MTL TIP RT (MISCELLANEOUS)
CANNULA VENNOUS METAL TIP 20FR (CANNULA) ×3 IMPLANT
CATH CPB KIT OWEN (MISCELLANEOUS) ×3 IMPLANT
CATH THORACIC 28FR RT ANG (CATHETERS) IMPLANT
CATH THORACIC 36FR (CATHETERS) ×3 IMPLANT
CLAMP ISOLATOR SYNERGY LG (MISCELLANEOUS) ×3 IMPLANT
CLIP FOGARTY SPRING 6M (CLIP) IMPLANT
CLIP TI MEDIUM 24 (CLIP) IMPLANT
CLIP TI WIDE RED SMALL 24 (CLIP) IMPLANT
CONN 1/2X1/2X1/2  BEN (MISCELLANEOUS) ×2
CONN 1/2X1/2X1/2 BEN (MISCELLANEOUS) ×4 IMPLANT
CONN 3/8X1/2 ST GISH (MISCELLANEOUS) ×6 IMPLANT
CONN ST 1/4X3/8  BEN (MISCELLANEOUS) ×2
CONN ST 1/4X3/8 BEN (MISCELLANEOUS) ×4 IMPLANT
CONNECTOR 1/2X3/8X1/2 3 WAY (MISCELLANEOUS) ×1
CONNECTOR 1/2X3/8X1/2 3WAY (MISCELLANEOUS) ×2 IMPLANT
CONT SPEC 4OZ CLIKSEAL STRL BL (MISCELLANEOUS) ×3 IMPLANT
COVER PROBE W GEL 5X96 (DRAPES) ×3 IMPLANT
COVER SURGICAL LIGHT HANDLE (MISCELLANEOUS) ×3 IMPLANT
CRADLE DONUT ADULT HEAD (MISCELLANEOUS) ×6 IMPLANT
DERMABOND ADVANCED (GAUZE/BANDAGES/DRESSINGS) ×2
DERMABOND ADVANCED .7 DNX12 (GAUZE/BANDAGES/DRESSINGS) ×4 IMPLANT
DEVICE ATRICLIP LAA PRCLPII 45 (Clip) ×2 IMPLANT
DEVICE SUT CK QUICK LOAD MINI (Prosthesis & Implant Heart) ×6 IMPLANT
DRAIN CHANNEL 32F RND 10.7 FF (WOUND CARE) ×9 IMPLANT
DRAPE CARDIOVASCULAR INCISE (DRAPES) ×1
DRAPE INCISE IOBAN 66X45 STRL (DRAPES) ×6 IMPLANT
DRAPE SLUSH/WARMER DISC (DRAPES) ×6 IMPLANT
DRAPE SRG 135X102X78XABS (DRAPES) ×2 IMPLANT
DRSG AQUACEL AG ADV 3.5X14 (GAUZE/BANDAGES/DRESSINGS) ×3 IMPLANT
DRSG COVADERM 4X14 (GAUZE/BANDAGES/DRESSINGS) IMPLANT
ELECT BLADE 4.0 EZ CLEAN MEGAD (MISCELLANEOUS) ×3
ELECT REM PT RETURN 9FT ADLT (ELECTROSURGICAL) ×12
ELECTRODE BLDE 4.0 EZ CLN MEGD (MISCELLANEOUS) ×2 IMPLANT
ELECTRODE REM PT RTRN 9FT ADLT (ELECTROSURGICAL) ×8 IMPLANT
FELT TEFLON 1X6 (MISCELLANEOUS) ×12 IMPLANT
GAUZE SPONGE 4X4 12PLY STRL (GAUZE/BANDAGES/DRESSINGS) IMPLANT
GLOVE BIO SURGEON STRL SZ 6 (GLOVE) IMPLANT
GLOVE BIO SURGEON STRL SZ 6.5 (GLOVE) ×24 IMPLANT
GLOVE BIO SURGEON STRL SZ7 (GLOVE) IMPLANT
GLOVE BIO SURGEON STRL SZ7.5 (GLOVE) ×6 IMPLANT
GLOVE BIOGEL PI IND STRL 6.5 (GLOVE) ×10 IMPLANT
GLOVE BIOGEL PI INDICATOR 6.5 (GLOVE) ×5
GLOVE ORTHO TXT STRL SZ7.5 (GLOVE) ×9 IMPLANT
GLOVE SURG SS PI 6.0 STRL IVOR (GLOVE) ×3 IMPLANT
GOWN STRL REUS W/ TWL LRG LVL3 (GOWN DISPOSABLE) ×16 IMPLANT
GOWN STRL REUS W/TWL LRG LVL3 (GOWN DISPOSABLE) ×8
HEMOSTAT POWDER SURGIFOAM 1G (HEMOSTASIS) ×9 IMPLANT
INSERT FOGARTY XLG (MISCELLANEOUS) ×6 IMPLANT
KIT BASIN OR (CUSTOM PROCEDURE TRAY) ×3 IMPLANT
KIT DRAINAGE VACCUM ASSIST (KITS) ×3 IMPLANT
KIT ROOM TURNOVER OR (KITS) ×6 IMPLANT
KIT SUCTION CATH 14FR (SUCTIONS) ×21 IMPLANT
KIT SUT CK MINI COMBO 4X17 (Prosthesis & Implant Heart) ×3 IMPLANT
KIT VASOVIEW HEMOPRO VH 3000 (KITS) ×3 IMPLANT
LEAD PACING MYOCARDI (MISCELLANEOUS) ×3 IMPLANT
LINE VENT (MISCELLANEOUS) ×3 IMPLANT
LOOP VESSEL SUPERMAXI WHITE (MISCELLANEOUS) ×3 IMPLANT
MARKER GRAFT CORONARY BYPASS (MISCELLANEOUS) ×9 IMPLANT
NS IRRIG 1000ML POUR BTL (IV SOLUTION) ×12 IMPLANT
PACK OPEN HEART (CUSTOM PROCEDURE TRAY) ×6 IMPLANT
PAD ARMBOARD 7.5X6 YLW CONV (MISCELLANEOUS) ×6 IMPLANT
PAD ELECT DEFIB RADIOL ZOLL (MISCELLANEOUS) ×3 IMPLANT
PENCIL BUTTON HOLSTER BLD 10FT (ELECTRODE) ×3 IMPLANT
PROBE CRYO2-ABLATION MALLABLE (MISCELLANEOUS) ×3 IMPLANT
PUNCH AORTIC ROTATE  4.5MM 8IN (MISCELLANEOUS) ×3 IMPLANT
PUNCH AORTIC ROTATE 4.0MM (MISCELLANEOUS) IMPLANT
PUNCH AORTIC ROTATE 4.5MM 8IN (MISCELLANEOUS) IMPLANT
PUNCH AORTIC ROTATE 5MM 8IN (MISCELLANEOUS) IMPLANT
SENSOR MYOCARDIAL TEMP (MISCELLANEOUS) ×3 IMPLANT
SET CARDIOPLEGIA MPS 5001102 (MISCELLANEOUS) ×3 IMPLANT
SET IRRIG TUBING LAPAROSCOPIC (IRRIGATION / IRRIGATOR) ×3 IMPLANT
SOLUTION ANTI FOG 6CC (MISCELLANEOUS) ×3 IMPLANT
SPONGE GAUZE 4X4 12PLY STER LF (GAUZE/BANDAGES/DRESSINGS) ×6 IMPLANT
SPONGE LAP 18X18 X RAY DECT (DISPOSABLE) ×3 IMPLANT
SPONGE LAP 4X18 X RAY DECT (DISPOSABLE) ×3 IMPLANT
SUCKER INTRACARDIAC WEIGHTED (SUCKER) ×3 IMPLANT
SUT BONE WAX W31G (SUTURE) ×3 IMPLANT
SUT ETHIBON 2 0 V 52N 30 (SUTURE) ×3 IMPLANT
SUT ETHIBOND 2 0 SH (SUTURE) ×8 IMPLANT
SUT ETHIBOND 2 0 SH 36X2 (SUTURE) ×4 IMPLANT
SUT ETHIBOND 2 0 V4 (SUTURE) IMPLANT
SUT ETHIBOND 2 0V4 GREEN (SUTURE) IMPLANT
SUT ETHIBOND 4 0 TF (SUTURE) IMPLANT
SUT ETHIBOND 5 0 C 1 30 (SUTURE) IMPLANT
SUT ETHIBOND X763 2 0 SH 1 (SUTURE) ×12 IMPLANT
SUT MNCRL AB 3-0 PS2 18 (SUTURE) ×18 IMPLANT
SUT MNCRL AB 4-0 PS2 18 (SUTURE) IMPLANT
SUT PDS AB 1 CTX 36 (SUTURE) ×12 IMPLANT
SUT PROLENE 2 0 SH DA (SUTURE) IMPLANT
SUT PROLENE 3 0 SH 1 (SUTURE) ×3 IMPLANT
SUT PROLENE 3 0 SH DA (SUTURE) ×9 IMPLANT
SUT PROLENE 3 0 SH1 36 (SUTURE) ×6 IMPLANT
SUT PROLENE 4 0 RB 1 (SUTURE) ×2
SUT PROLENE 4 0 SH DA (SUTURE) ×6 IMPLANT
SUT PROLENE 4-0 RB1 .5 CRCL 36 (SUTURE) ×4 IMPLANT
SUT PROLENE 5 0 C 1 36 (SUTURE) IMPLANT
SUT PROLENE 6 0 C 1 30 (SUTURE) IMPLANT
SUT PROLENE 7.0 RB 3 (SUTURE) ×6 IMPLANT
SUT PROLENE 8 0 BV175 6 (SUTURE) IMPLANT
SUT PROLENE BLUE 7 0 (SUTURE) ×3 IMPLANT
SUT PROLENE POLY MONO (SUTURE) IMPLANT
SUT SILK  1 MH (SUTURE) ×5
SUT SILK 1 MH (SUTURE) ×10 IMPLANT
SUT STEEL 6MS V (SUTURE) IMPLANT
SUT STEEL STERNAL CCS#1 18IN (SUTURE) ×3 IMPLANT
SUT STEEL SZ 6 DBL 3X14 BALL (SUTURE) ×6 IMPLANT
SUT VIC AB 1 CTX 36 (SUTURE) ×1
SUT VIC AB 1 CTX36XBRD ANBCTR (SUTURE) ×2 IMPLANT
SUT VIC AB 2-0 CT1 27 (SUTURE) ×1
SUT VIC AB 2-0 CT1 TAPERPNT 27 (SUTURE) ×2 IMPLANT
SUT VIC AB 2-0 CTX 27 (SUTURE) IMPLANT
SUT VIC AB 3-0 SH 27 (SUTURE)
SUT VIC AB 3-0 SH 27X BRD (SUTURE) IMPLANT
SUT VIC AB 3-0 SH 8-18 (SUTURE) ×3 IMPLANT
SUT VIC AB 3-0 X1 27 (SUTURE) ×3 IMPLANT
SUT VICRYL 4-0 PS2 18IN ABS (SUTURE) IMPLANT
SUTURE E-PAK OPEN HEART (SUTURE) ×3 IMPLANT
SYR 5ML LUER SLIP (SYRINGE) ×3 IMPLANT
SYSTEM SAHARA CHEST DRAIN ATS (WOUND CARE) ×6 IMPLANT
TAPE CLOTH SURG 4X10 WHT LF (GAUZE/BANDAGES/DRESSINGS) ×6 IMPLANT
TOWEL OR 17X24 6PK STRL BLUE (TOWEL DISPOSABLE) ×12 IMPLANT
TOWEL OR 17X26 10 PK STRL BLUE (TOWEL DISPOSABLE) ×3 IMPLANT
TRAY FOLEY IC TEMP SENS 14FR (CATHETERS) ×3 IMPLANT
TRAY FOLEY IC TEMP SENS 16FR (CATHETERS) ×6 IMPLANT
TUBE SUCTION CARDIAC 10FR (CANNULA) ×3 IMPLANT
TUBING INSUFFLATION (TUBING) ×3 IMPLANT
UNDERPAD 30X30 (UNDERPADS AND DIAPERS) ×6 IMPLANT
VALVE MITRAL 29MM (Prosthesis & Implant Heart) ×3 IMPLANT
WATER STERILE IRR 1000ML POUR (IV SOLUTION) ×6 IMPLANT
YANKAUER SUCT BULB TIP NO VENT (SUCTIONS) ×3 IMPLANT

## 2016-05-28 NOTE — Progress Notes (Signed)
Pt able to wake up, open eyes, and move all four extremities on command. Will continue to monitor.

## 2016-05-28 NOTE — OR Nursing (Signed)
38 First call made to SICU.

## 2016-05-28 NOTE — Interval H&P Note (Signed)
History and Physical Interval Note:  05/28/2016 5:45 AM  Marie Malone  has presented today for surgery, with the diagnosis of MS CAD AFIB  The various methods of treatment have been discussed with the patient and family. After consideration of risks, benefits and other options for treatment, the patient has consented to  Procedure(s): MITRAL VALVE (MV) REPLACEMENT (N/A) CORONARY ARTERY BYPASS GRAFTING (CABG) (N/A) MAZE (N/A) TRANSESOPHAGEAL ECHOCARDIOGRAM (TEE) (N/A) as a surgical intervention .  The patient's history has been reviewed, patient examined, no change in status, stable for surgery.    Unfortunately, the patient did not come in for her pre-op visit and lab work 2 days ago because of inclement weather, and our office was not notified until late yesterday afternoon.  We will draw her routine blood work (except for ABG), get a CXR and urinalysis STAT.  We will hold on proceeding with surgery until we verify that everything appears in order.  This may delay the OR getting started on time.   Rexene Alberts, MD 05/28/2016 5:49 AM

## 2016-05-28 NOTE — Anesthesia Postprocedure Evaluation (Addendum)
Anesthesia Post Note  Patient: Marie Malone  Procedure(s) Performed: Procedure(s) (LRB): MITRAL VALVE (MV) REPLACEMENT USING 29 MM CARBOMEDICS OPTIFORM MECHANICAL BILEAFLET PROSTHETIC HEART VALVE (N/A) CORONARY ARTERY BYPASS GRAFTING (CABG) x 1 WITH ENDOSCOPIC HARVESTING OF RIGHT SAPHENOUS VEIN, EVH- PDA (N/A) MAZE PROCEDURE AND APPLICATION OF  ATRICLIP LAA PROCLIP II 45 MM (N/A) TRANSESOPHAGEAL ECHOCARDIOGRAM (TEE) (N/A)  Patient location during evaluation: SICU Anesthesia Type: General Level of consciousness: sedated Pain management: pain level controlled Vital Signs Assessment: post-procedure vital signs reviewed and stable Respiratory status: patient remains intubated per anesthesia plan Cardiovascular status: stable Anesthetic complications: no       Last Vitals:  Vitals:   05/28/16 1715 05/28/16 1730  BP:    Pulse: 80 80  Resp: 11 14  Temp: 36.4 C 36.3 C    Last Pain:  Vitals:   05/28/16 1730  TempSrc: Core (Comment)                 Marie Malone

## 2016-05-28 NOTE — OR Nursing (Signed)
1612 Rolling call made to SICU.

## 2016-05-28 NOTE — Progress Notes (Signed)
[facility] BRIEF SICU PROGRESS NOTE  Day of Surgery  S/P Procedure(s) (LRB): MITRAL VALVE (MV) REPLACEMENT USING 29 MM CARBOMEDICS OPTIFORM MECHANICAL BILEAFLET PROSTHETIC HEART VALVE (N/A) CORONARY ARTERY BYPASS GRAFTING (CABG) x 1 WITH ENDOSCOPIC HARVESTING OF RIGHT SAPHENOUS VEIN, EVH- PDA (N/A) MAZE PROCEDURE AND APPLICATION OF  ATRICLIP LAA PROCLIP II 45 MM (N/A) TRANSESOPHAGEAL ECHOCARDIOGRAM (TEE) (N/A)   Sedated on vent AAI paced rhythm w/ stable hemodynamics on low dose milrinone O2 sats 100% Chest tube output low UOP excellent Labs okay  Plan: Continue routine early postop  Rexene Alberts, MD 05/28/2016 5:41 PM

## 2016-05-28 NOTE — Anesthesia Procedure Notes (Signed)
Procedure Name: Intubation Date/Time: 05/28/2016 9:39 AM Performed by: Oletta Lamas Pre-anesthesia Checklist: Patient identified, Emergency Drugs available, Suction available and Patient being monitored Patient Re-evaluated:Patient Re-evaluated prior to inductionOxygen Delivery Method: Circle System Utilized Preoxygenation: Pre-oxygenation with 100% oxygen Intubation Type: IV induction Ventilation: Mask ventilation without difficulty Laryngoscope Size: Mac and 3 Grade View: Grade I Tube type: Oral Tube size: 7.5 mm Number of attempts: 1 Airway Equipment and Method: Stylet Placement Confirmation: ETT inserted through vocal cords under direct vision,  positive ETCO2 and breath sounds checked- equal and bilateral Secured at: 22 cm Tube secured with: Tape Dental Injury: Teeth and Oropharynx as per pre-operative assessment

## 2016-05-28 NOTE — Anesthesia Procedure Notes (Signed)
Central Venous Catheter Insertion Performed by: Effie Berkshire, anesthesiologist Start/End1/19/2018 8:55 AM, 05/28/2016 9:05 AM Patient location: Pre-op. Preanesthetic checklist: patient identified, IV checked, site marked, risks and benefits discussed, surgical consent, monitors and equipment checked, pre-op evaluation, timeout performed and anesthesia consent Position: Trendelenburg Lidocaine 1% used for infiltration and patient sedated Hand hygiene performed , maximum sterile barriers used  and Seldinger technique used Catheter size: 9 Fr Total catheter length 10. Central line was placed.Sheath introducer Swan type:thermodilution PA Cath depth:50 Procedure performed using ultrasound guided technique. Ultrasound Notes:anatomy identified, needle tip was noted to be adjacent to the nerve/plexus identified, no ultrasound evidence of intravascular and/or intraneural injection and image(s) printed for medical record Attempts: 1 Following insertion, line sutured and dressing applied. Post procedure assessment: blood return through all ports, free fluid flow and no air  Patient tolerated the procedure well with no immediate complications.

## 2016-05-28 NOTE — Transfer of Care (Signed)
Immediate Anesthesia Transfer of Care Note  Patient: Marie Malone  Procedure(s) Performed: Procedure(s): MITRAL VALVE (MV) REPLACEMENT USING 29 MM CARBOMEDICS OPTIFORM MECHANICAL BILEAFLET PROSTHETIC HEART VALVE (N/A) CORONARY ARTERY BYPASS GRAFTING (CABG) x 1 WITH ENDOSCOPIC HARVESTING OF RIGHT SAPHENOUS VEIN, EVH- PDA (N/A) MAZE PROCEDURE AND APPLICATION OF  ATRICLIP LAA PROCLIP II 45 MM (N/A) TRANSESOPHAGEAL ECHOCARDIOGRAM (TEE) (N/A)  Patient Location: SICU  Anesthesia Type:General  Level of Consciousness: sedated and Patient remains intubated per anesthesia plan  Airway & Oxygen Therapy: Patient remains intubated per anesthesia plan and Patient placed on Ventilator (see vital sign flow sheet for setting)  Post-op Assessment: Report given to RN and Post -op Vital signs reviewed and stable  Post vital signs: Reviewed and stable  Last Vitals:  Vitals:   05/28/16 0607 05/28/16 1623  BP: (!) 181/76 120/77  Pulse: 61 83  Resp: 16 12  Temp: 37 C     Last Pain:  Vitals:   05/28/16 0607  TempSrc: Oral         Complications: No apparent anesthesia complications   VSS.

## 2016-05-28 NOTE — Brief Op Note (Addendum)
05/28/2016  1:37 PM  PATIENT:  Marie Malone  66 y.o. female  PRE-OPERATIVE DIAGNOSIS:  MS CAD AFIB  POST-OPERATIVE DIAGNOSIS:  MS CAD  PROCEDURE:  Procedure(s): MITRAL VALVE (MV) REPLACEMENT USING 29 MM CARBOMEDICS OPTIFORM MECHANICAL BILEAFLET PROSTHETIC HEART VALVE (N/A) CORONARY ARTERY BYPASS GRAFTING (CABG) x 1 WITH ENDOSCOPIC HARVESTING OF RIGHT SAPHENOUS VEIN (N/A) MAZE PROCEDURE AND APPLICATION OF  ATRICLIP LAA PROCLIP II 45 MM (N/A) TRANSESOPHAGEAL ECHOCARDIOGRAM (TEE) (N/A) EVH- PDA  SURGEON:    Rexene Alberts, MD  ASSISTANTS:  John Giovanni, PA-C and Brigid Re, PA-S  ANESTHESIA:   Effie Berkshire, MD  CROSSCLAMP TIME:   131'  CARDIOPULMONARY BYPASS TIME: 182'  FINDINGS:  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  Cryo: Yes  Cryo Lesions (select all that apply):       6  Mitral Valve Cryo Lesion,     10  Tricuspid Cryo Lesion, 16  Other - epicardial posterior AV groove and coronary sinus  Radiofrequency:  Yes.  Bipolar: Yes.  RF Lesions (select all that apply):      1   Pulmonary Vein Isolation,    2   Box Lesion,   3a  Inferior Pulmonary Vein Connecting Lesion,   3b  Superior Pulmonary Vein Connecting Lesion,     4  Posterior Mirtal Annular Line,    11  Intercaval Line,   15a  RAA Lateral Wall (Short) and   15b  RAA Lateral Wall to "T" Lesion    Left Atrial Appendage Treatment:    Yes -  epicardial clip     COMPLICATIONS: None  BASELINE WEIGHT: 71 kg  PATIENT DISPOSITION:   TO SICU IN STABLE CONDITION  Rexene Alberts, MD 05/28/2016 3:36 PM

## 2016-05-28 NOTE — Progress Notes (Signed)
Started Rapid Wean Protocol at 2340, but unable to complete d/t inadequate spontaneous breaths d/t pt drowsiness. Flipped back to full support at 2347. Will continue to monitor to see when to attempt again.

## 2016-05-28 NOTE — Progress Notes (Signed)
  Echocardiogram Echocardiogram Transesophageal has been performed.  Marie Malone 05/28/2016, 10:18 AM

## 2016-05-28 NOTE — Anesthesia Procedure Notes (Signed)
Central Venous Catheter Insertion Performed by: Effie Berkshire, anesthesiologist Start/End1/19/2018 9:05 AM, 05/28/2016 9:10 AM Patient location: Pre-op. Preanesthetic checklist: patient identified, IV checked, site marked, risks and benefits discussed, surgical consent, monitors and equipment checked, pre-op evaluation, timeout performed and anesthesia consent Hand hygiene performed  and maximum sterile barriers used  PA cath was placed.Swan type:thermodilution Procedure performed using ultrasound guided technique. Ultrasound Notes:anatomy identified, needle tip was noted to be adjacent to the nerve/plexus identified, no ultrasound evidence of intravascular and/or intraneural injection and image(s) printed for medical record Attempts: 1 Patient tolerated the procedure well with no immediate complications.

## 2016-05-28 NOTE — OR Nursing (Signed)
[place] call made to SICU.

## 2016-05-28 NOTE — Progress Notes (Signed)
Rapid Wean Protocol started. Pt on 40/4.

## 2016-05-28 NOTE — Op Note (Signed)
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 Roxy Manns, MD  Assistant: John Giovanni, PA-C and Brigid Re, PA-S  Anesthesia: Suella Broad, MD  Operative Findings:  Rheumatic mitral valve disease with severe mitral stenosis and moderate mitral regurgitation  Type IIIA mitral valve dysfunction  Normal LV systolic function  Moderate-severe pulmonary hypertension                  BRIEF CLINICAL NOTE AND INDICATIONS FOR SURGERY  Patient is a 66 year old African-American female with rheumatic heart disease including mitral stenosis status post balloon mitral valvuloplasty in 9741, chronic diastolic congestive heart failure, recurrent paroxysmal atrial fibrillation, 2 previous embolic strokes who has been referred for surgical consultation to discuss treatment options for management of severe mitral stenosis. The patient denies any known history of rheumatic fever during childhood but she states that she had recurrent streptococcal infections on many occasions during her teenage years. She first presented with symptoms of shortness of breath in 2008 and was diagnosed with severe mitral stenosis. She was initially evaluated by a cardiologist in [facility]. Cardiac catheterization  performedin June of 2008 showed normal LV function, no coronary disease, moderate mitral stenosis with a valve area of 1.1 cm, and severe pulmonary hypertension. TEE in [facility] in 2008 showed normal LV function and moderate mitral stenosis with a valve area of 1.4 cm, mild MR. [facility] was[facility] where she underwent balloon mitral valvuloplasty. She did well for several years but she suffered an embolic stroke in 6384. She was reportedly anticoagulated using warfarin for a period of time but this was later stopped. In 2014 the patient was referred to [name] for evaluation and she has been followed closely ever since. In November 2014 she suffered a second embolic stroke which involved a fairly large area in the left MCA distribution. Symptoms of right-sided weakness improved very quickly. TEE performed at that time revealed mild mitral stenosis and mild mitral regurgitation. There was spontaneous contrast in the dilated left atrium and left atrial appendage, but no thrombus. There was a small oscillating density on the anterior mitral valve leaflet of uncertain etiology, and vegetation could not be excluded. Blood cultures were negative, but the patient had been receiving oral doxycycline. There were no other signs of endocarditis, although sed rate was elevated. The patient was started on Plavix because of her stroke. An implantable loop recorder was placed and she was treated with a 6 week course of antibiotics. Implantable loop recorder confirmed the presence of recurrent paroxysmal atrial fibrillation, and the patient was started on warfarin.  The patient did well for approximately 2 years and has been followed intermittently by [name] and [name]. Follow-up transthoracic echocardiogram performed in August of this year revealed normal left ventricular systolic function with moderate mitral stenosis (mean transvalvular gradient estimated 8 mmHg)  moderate mitral regurgitation, and severe  left atrial enlargement. At that time the patient began to complain of occasional substernal chest tightness and worsening dyspnea with exertion. Follow-up carotid duplex scan performed at that time revealed 60-79% stenosis of the right internal carotid artery and CTA was recommended. CTA revealed radiographic string sign consistent with tight right internal carotid artery stenosis. The patient subsequently underwent elective right carotid endarterectomy with patch angioplasty by [name] on 03/10/2016. She initially did well but she was readmitted to the hospital on 03/19/2016 with altered mental status associated with tachycardia and hypotension. She was observed overnight in the hospital where she was given gentle IV hydration. It was felt that her symptoms were likely related to hypotension and orthostasis. She was discharged home the following day but was again readmitted less than 24 hours later following a frank syncopal episode. In the emergency department she was still noted to be orthostatic with drop in blood pressure from 152/61 to 118/62. She was given IV hydration. MRI of the brain revealed no evidence for acute stroke. Neurology was consulted and EEG revealed normal awake and sleep findings with no seizure-like activity. EKG revealed sinus rhythm with occasional brief runs of nonsustained VT. follow-up echocardiogram revealed normal left ventricular systolic function with severe mitral stenosis and mild mitral regurgitation. Peak and mean transvalvular gradients across the mitral valve were estimated 12 and 9 mmHg, respectively. Indexed valve area by pressure half-time was reported 0.82 cm/m. The patient subsequently underwent transesophageal echocardiogram 03/24/2016. This confirmed the presence of rheumatic mitral valve disease with severe mitral stenosis. Mean transvalvular gradient across mitral valve was estimated 11 mmHg with valve area by  pressure half-time estimated 1.25 cm but valve area using the continuity equation only 0.77 cm. The left atrium was dilated. There was no thrombus in the left atrial appendage. The patient underwent left and right heart catheterization 03/25/2016. This confirmed the presence of normal left ventricular systolic function. Mean transvalvular gradient across the mitral valve was entered 10 mmHg corresponding to valve area calculated 1.3 cm, indexed valve area 0.76 cm/m. The patient had severe single-vessel coronary artery disease with 100% chronic occlusion of the right coronary artery and left to right collaterals. There was moderate pulmonary hypertension with PA pressures measured 66/21, mean pulmonary capillary wedge pressure 13 mmHg and mean CVP reported 1 mmHg . Cardiothoracic surgical consultation was requested.  The patient has been seen in consultation and counseled at length regarding the indications, risks and potential benefits of surgery.  All questions have been answered, and the patient provides full informed consent for the operation as described.     DETAILS OF THE OPERATIVE PROCEDURE  Preparation:  The patient is brought to the operating room on the above mentioned date and central monitoring was established by the anesthesia team including placement of Swan-Ganz catheter and radial arterial line. The patient had moderate pulmonary hypertension at baseline.  The patient is placed in the supine position on the operating table.  Intravenous antibiotics are administered. General endotracheal anesthesia is induced uneventfully. A Foley catheter is placed.  Baseline transesophageal echocardiogram was performed.  Findings were notable for classical rheumatic features involving the mitral valve. There was severe mitral stenosis with moderate mitral regurgitation. There was normal left ventricular size and systolic function. There was mild to moderate left ventricular hypertrophy. The aortic  valve appeared normal. Right ventricular size and function was normal. There was trace tricuspid regurgitation.  The patient's chest, abdomen, both groins, and both lower extremities are prepared and draped in a sterile  manner. A time out procedure is performed.   Surgical Approach and Conduit Harvest:  A median sternotomy incision was performed. Simultaneously, the greater saphenous vein is obtained from the patient's right thigh and leg using endoscopic vein harvest technique. The saphenous vein is notably good quality conduit. After removal of the saphenous vein, the small surgical incisions in the lower extremity are closed with absorbable suture.    Extracorporeal Cardiopulmonary Bypass and Myocardial Protection:  The right common femoral vein is cannulated using the Seldinger technique and a guidewire advanced into the right atrium using TEE guidance.  The patient is heparinized systemically and the femoral vein cannulated using a 22 Fr long femoral venous cannula.  The ascending aorta is cannulated for cardiopulmonary bypass.  Adequate heparinization is verified.   A retrograde cardioplegia cannula is placed through the right atrium into the coronary sinus.   The entire pre-bypass portion of the operation was notable for stable hemodynamics.  Cardiopulmonary bypass was begun and the surface of the heart is inspected.  A second venous cannula is placed directly into the superior vena cava.   A cardioplegia cannula is placed in the ascending aorta.  A temperature probe was placed in the interventricular septum.  The patient is cooled to 32C systemic temperature.  The aortic cross clamp is applied and cold blood cardioplegia is delivered initially in an antegrade fashion through the aortic root.  Supplemental cardioplegia is given retrograde through the coronary sinus catheter.  Iced saline slush is applied for topical hypothermia.  The initial cardioplegic arrest is rapid with early diastolic  arrest.  Repeat doses of cardioplegia are administered intermittently throughout the entire cross clamp portion of the operation through the aortic root, through the subsequently placed vein graft, and through the coronary sinus catheter in order to maintain completely flat electrocardiogram and septal myocardial temperature below 15C.  Myocardial protection was felt to be excellent.   Coronary Artery Bypass Grafting:  The posterior descending branch of the right coronary artery was grafted using a reversed saphenous vein graft in an end-to-side fashion.  At the site of distal anastomosis the target vessel was fair to good quality and measured approximately 1.5 mm in diameter.   Maze Procedure (left atrial lesion set):  The AtriCure Synergy bipolar radiofrequency ablation clamp is used for all radiofrequency ablation lesions for the maze procedure.  The Atricure CryoICE nitrous oxide cryothermy system is utilized for all cryothermy ablation lesions.   The heart is retracted towards the surgeon's side and the left sided pulmonary veins exposed.  An elliptical ablation lesion is created around the base of the left sided pulmonary veins.  A similar elliptical lesion was created around the base of the left atrial appendage.  The left atrial appendage was obliterated using an Atricure left atrial appendage clip (Atriclip, size 75mm).  The heart was replaced into the pericardial sac.  A left atriotomy incision was performed through the interatrial groove and extended partially across the back wall of the left atrium after opening the oblique sinus inferiorly.  The floor of the left atrium and the mitral valve were exposed using a self-retaining retractor.    An ablation lesion was placed around the right sided pulmonary veins using the bipolar clamp with one limb of the clamp along the endocardial surface and one along the epicardial surface posteriorly.  A bipolar ablation lesion was placed across the  dome of the left atrium from the cephalad apex of the atriotomy incision to reach the cephalad apex  of the elliptical lesion around the left sided pulmonary veins.  A similar bipolar lesion was placed across the back wall of the left atrium from the caudad apex of the atriotomy incision to reach the caudad apex of the elliptical lesion around the left sided pulmonary veins, thereby completing a box.  Finally another bipolar lesion was placed across the back wall of the left atrium from the caudad apex of the atriotomy incision towards the posterior mitral valve annulus.  This lesion was completed along the endocardial surface onto the posterior mitral annulus with a 3 minute duration cryothermy lesion, followed by a second cryothermy lesion along the posterior epicardial surface of the left atrium to the coronary sinus.  This completes the entire left side lesion set of the Cox maze procedure.   Mitral Valve Replacement:  The mitral valve was inspected and notable for classical rheumatic appearance with severe thickening, fibrosis, and calcification involving both the anterior and posterior leaflets of the mitral valve. There was severe chordal fusion and foreshortening involving all of the primary cords to both leaflets. There was a large chunk of calcification at the base of the anterior leaflet near the inter-annular fibrosis. The entire posterior annulus was heavily calcified.  The anterior leaflet of the mitral valve was excised sharply.  It was not possible to preserve any of the subvalvular apparatus to the anterior leaflet.  The posterior leaflet split in the midline and debulked.  The mitral annulus was sized to accept a 29 mm prosthesis.  The left ventricle was irrigated with copious cold saline solution.  Mitral valve replacement was performed using interrupted horizontal mattress 2-0 Ethibond pledgeted sutures with pledgets in the supraannular position.  The remaining portions of the anterior  leaflet were incorporated into the suture line laterally, thereby preserving chords to both the anterior and posterior leaflet.  A Sorin Carbomedics Optiform bileaflet mechanical valve (size 29 mm, model # [id number], serial # [id number]) was implanted uneventfully. The valve seated appropriately and without difficulty.  The valve was carefully inspected to make sure that both leaflets opened and closed normally without any potential impingement in the subvalvular apparatus. The valve was also carefully inspected to make sure there was no paravalvular leak.  Rewarming is begun.  The atriotomy was closed using a 2-layer closure of running 3-0 Prolene suture after placing a sump drain across the mitral valve to serve as a left ventricular vent.  The single proximal saphenous vein anastomosis was performed directly to the ascending aorta prior to removal of the aortic cross-clamp.  One final dose of warm retrograde "hot shot" cardioplegia was administered retrograde through the coronary sinus catheter while all air was evacuated through the aortic root.  The aortic cross clamp was removed after a total cross clamp time of 131 minutes.   Maze Procedure (right atrial lesion set):  The retrograde cardioplegia cannula was removed and the small hole in the right atrium extended a short distance.  The AtriCure Synergy bipolar radiofrequency ablation clamp is utilized to create a series of linear lesions in the right atrium, each with one limb of the clamp along the endocardial surface and the other along the epicardial surface. The first lesion is placed from the posterior apex of the atriotomy incision and along the lateral wall of the right atrium to reach the lateral aspect of the superior vena cava. A second lesion is placed in the opposite direction from the posterior apex of the atriotomy incision along the lateral wall to reach  the lateral aspect of the inferior vena cava. A third lesion is placed from the  midportion of the atriotomy incision extending at a right angle to reach the tip of the right atrial appendage. A fourth lesion is placed from the anterior apex of the atriotomy incision in an anterior and inferior direction to reach the acute margin of the heart. Finally, the cryotherapy probe is utilized to complete the right atrial lesion set by placing the probe along the endocardial surface of the right atrium from the anterior apex of the atriotomy incision to reach the tricuspid annulus at the 2:00 position. The right atriotomy incision is closed with a 2 layer closure of running 4-0 Prolene suture.   Procedure Completion:  Epicardial pacing wires are fixed to the right ventricular outflow tract and to the right atrial appendage. The patient is rewarmed to 37C temperature. The aortic and left ventricular vents are removed.  The patient is weaned and disconnected from cardiopulmonary bypass.  The patient's rhythm at separation from bypass was AV paced.  The patient was weaned from cardioplegic bypass on low dose milrinone infusion. Total cardiopulmonary bypass time for the operation was 182 minutes.  Followup transesophageal echocardiogram performed after separation from bypass revealed a well-seated mitral valve prosthesis that was functioning normally and without any sign of perivalvular leak.  Left ventricular function was unchanged from preoperatively.  The aortic and superior vena cava cannula were removed uneventfully. Protamine was administered to reverse the anticoagulation. The femoral venous cannula was removed and manual pressure held on the groin for 30 minutes.  The mediastinum and pleural space were inspected for hemostasis and irrigated with saline solution. The mediastinum and both pleural spaces were drained using 4 chest tubes placed through separate stab incisions inferiorly.  The soft tissues anterior to the aorta were reapproximated loosely. The sternum is closed with double  strength sternal wire. The soft tissues anterior to the sternum were closed in multiple layers and the skin is closed with a running subcuticular skin closure.  The post-bypass portion of the operation was notable for stable rhythm and hemodynamics.  The patient received a total of 2 packs adult platelets and 2 units fresh frozen plasma due to coagulopathy and thrombocytopenia after separation from cardiopulmonary bypass and reversal of heparin with protamine.  The patient received 2 units packed red blood cells during the procedure due to anemia which was present preoperatively and exacerbated by acute blood loss and hemodilution during cardiopulmonary bypass.   Patient Disposition:  The patient tolerated the procedure well and is transported to the surgical intensive care in stable condition. There are no intraoperative complications. All sponge instrument and needle counts are verified correct at completion of the operation.     Valentina Gu. Roxy Manns MD 05/28/2016 3:43 PM

## 2016-05-28 NOTE — Progress Notes (Signed)
Communicated readiness to wean with RRT.

## 2016-05-29 ENCOUNTER — Inpatient Hospital Stay (HOSPITAL_COMMUNITY): Payer: BC Managed Care – PPO

## 2016-05-29 LAB — POCT I-STAT 3, ART BLOOD GAS (G3+)
ACID-BASE DEFICIT: 4 mmol/L — AB (ref 0.0–2.0)
ACID-BASE DEFICIT: 3 mmol/L — AB (ref 0.0–2.0)
ACID-BASE DEFICIT: 2 mmol/L (ref 0.0–2.0)
Acid-base deficit: 4 mmol/L — ABNORMAL HIGH (ref 0.0–2.0)
Acid-base deficit: 4 mmol/L — ABNORMAL HIGH (ref 0.0–2.0)
Acid-base deficit: 2 mmol/L (ref 0.0–2.0)
BICARBONATE: 21.4 mmol/L (ref 20.0–28.0)
BICARBONATE: 22 mmol/L (ref 20.0–28.0)
BICARBONATE: 22.7 mmol/L (ref 20.0–28.0)
BICARBONATE: 23.6 mmol/L (ref 20.0–28.0)
Bicarbonate: 22.4 mmol/L (ref 20.0–28.0)
Bicarbonate: 24.4 mmol/L (ref 20.0–28.0)
O2 SAT: 100 %
O2 SAT: 100 %
O2 SAT: 99 %
O2 Saturation: 96 %
O2 Saturation: 99 %
O2 Saturation: 100 %
PCO2 ART: 42.7 mmHg (ref 32.0–48.0)
PCO2 ART: 46.6 mmHg (ref 32.0–48.0)
PCO2 ART: 49.1 mmHg — AB (ref 32.0–48.0)
PH ART: 7.282 — AB (ref 7.350–7.450)
PH ART: 7.3 — AB (ref 7.350–7.450)
PH ART: 7.309 — AB (ref 7.350–7.450)
PO2 ART: 167 mmHg — AB (ref 83.0–108.0)
PO2 ART: 189 mmHg — AB (ref 83.0–108.0)
Patient temperature: 37.1
Patient temperature: 37.3
Patient temperature: 37
TCO2: 23 mmol/L (ref 0–100)
TCO2: 23 mmol/L (ref 0–100)
TCO2: 24 mmol/L (ref 0–100)
TCO2: 24 mmol/L (ref 0–100)
TCO2: 25 mmol/L (ref 0–100)
TCO2: 26 mmol/L (ref 0–100)
pCO2 arterial: 50.6 mmHg — ABNORMAL HIGH (ref 32.0–48.0)
pCO2 arterial: 46.2 mmHg (ref 32.0–48.0)
pCO2 arterial: 46.1 mmHg (ref 32.0–48.0)
pH, Arterial: 7.255 — ABNORMAL LOW (ref 7.350–7.450)
pH, Arterial: 7.304 — ABNORMAL LOW (ref 7.350–7.450)
pH, Arterial: 7.319 — ABNORMAL LOW (ref 7.350–7.450)
pO2, Arterial: 96 mmHg (ref 83.0–108.0)
pO2, Arterial: 185 mmHg — ABNORMAL HIGH (ref 83.0–108.0)
pO2, Arterial: 228 mmHg — ABNORMAL HIGH (ref 83.0–108.0)
pO2, Arterial: 236 mmHg — ABNORMAL HIGH (ref 83.0–108.0)

## 2016-05-29 LAB — GLUCOSE, CAPILLARY
GLUCOSE-CAPILLARY: 119 mg/dL — AB (ref 65–99)
GLUCOSE-CAPILLARY: 116 mg/dL — AB (ref 65–99)
GLUCOSE-CAPILLARY: 121 mg/dL — AB (ref 65–99)
GLUCOSE-CAPILLARY: 122 mg/dL — AB (ref 65–99)
GLUCOSE-CAPILLARY: 105 mg/dL — AB (ref 65–99)
GLUCOSE-CAPILLARY: 114 mg/dL — AB (ref 65–99)
Glucose-Capillary: 136 mg/dL — ABNORMAL HIGH (ref 65–99)
Glucose-Capillary: 128 mg/dL — ABNORMAL HIGH (ref 65–99)
Glucose-Capillary: 124 mg/dL — ABNORMAL HIGH (ref 65–99)
Glucose-Capillary: 115 mg/dL — ABNORMAL HIGH (ref 65–99)
Glucose-Capillary: 123 mg/dL — ABNORMAL HIGH (ref 65–99)
Glucose-Capillary: 124 mg/dL — ABNORMAL HIGH (ref 65–99)
Glucose-Capillary: 107 mg/dL — ABNORMAL HIGH (ref 65–99)
Glucose-Capillary: 126 mg/dL — ABNORMAL HIGH (ref 65–99)
Glucose-Capillary: 129 mg/dL — ABNORMAL HIGH (ref 65–99)

## 2016-05-29 LAB — CBC
HCT: 29.4 % — ABNORMAL LOW (ref 36.0–46.0)
HEMATOCRIT: 25.3 % — AB (ref 36.0–46.0)
HEMOGLOBIN: 9.3 g/dL — AB (ref 12.0–15.0)
Hemoglobin: 8.2 g/dL — ABNORMAL LOW (ref 12.0–15.0)
MCH: 27.5 pg (ref 26.0–34.0)
MCH: 26.7 pg (ref 26.0–34.0)
MCHC: 32.4 g/dL (ref 30.0–36.0)
MCHC: 31.6 g/dL (ref 30.0–36.0)
MCV: 84.9 fL (ref 78.0–100.0)
MCV: 84.5 fL (ref 78.0–100.0)
PLATELETS: 130 10*3/uL — AB (ref 150–400)
PLATELETS: 93 10*3/uL — AB (ref 150–400)
RBC: 2.98 MIL/uL — ABNORMAL LOW (ref 3.87–5.11)
RBC: 3.48 MIL/uL — AB (ref 3.87–5.11)
RDW: 16.8 % — AB (ref 11.5–15.5)
RDW: 17.5 % — ABNORMAL HIGH (ref 11.5–15.5)
WBC: 16.4 10*3/uL — ABNORMAL HIGH (ref 4.0–10.5)
WBC: 17.1 10*3/uL — AB (ref 4.0–10.5)

## 2016-05-29 LAB — PREPARE PLATELET PHERESIS
Unit division: 0
Unit division: 0

## 2016-05-29 LAB — POCT I-STAT, CHEM 8
BUN: 10 mg/dL (ref 6–20)
CALCIUM ION: 1.26 mmol/L (ref 1.15–1.40)
CREATININE: 1.1 mg/dL — AB (ref 0.44–1.00)
Chloride: 112 mmol/L — ABNORMAL HIGH (ref 101–111)
GLUCOSE: 139 mg/dL — AB (ref 65–99)
HCT: 27 % — ABNORMAL LOW (ref 36.0–46.0)
Hemoglobin: 9.2 g/dL — ABNORMAL LOW (ref 12.0–15.0)
Potassium: 4.3 mmol/L (ref 3.5–5.1)
Sodium: 147 mmol/L — ABNORMAL HIGH (ref 135–145)
TCO2: 23 mmol/L (ref 0–100)

## 2016-05-29 LAB — MAGNESIUM
MAGNESIUM: 2.3 mg/dL (ref 1.7–2.4)
Magnesium: 2.4 mg/dL (ref 1.7–2.4)

## 2016-05-29 LAB — BASIC METABOLIC PANEL
Anion gap: 5 (ref 5–15)
BUN: 5 mg/dL — AB (ref 6–20)
CALCIUM: 8.4 mg/dL — AB (ref 8.9–10.3)
CO2: 23 mmol/L (ref 22–32)
Chloride: 117 mmol/L — ABNORMAL HIGH (ref 101–111)
Creatinine, Ser: 0.82 mg/dL (ref 0.44–1.00)
GFR calc Af Amer: >60 mL/min (ref >60)
Glucose, Bld: 127 mg/dL — ABNORMAL HIGH (ref 65–99)
Potassium: 4.1 mmol/L (ref 3.5–5.1)
Sodium: 145 mmol/L (ref 135–145)

## 2016-05-29 LAB — HEMOGLOBIN A1C
Hgb A1c MFr Bld: 5.5 % (ref 4.8–5.6)
MEAN PLASMA GLUCOSE: 111 mg/dL

## 2016-05-29 LAB — MRSA PCR SCREENING: MRSA BY PCR: NEGATIVE

## 2016-05-29 LAB — PREPARE RBC (CROSSMATCH)

## 2016-05-29 LAB — CREATININE, SERUM
CREATININE: 1.16 mg/dL — AB (ref 0.44–1.00)
GFR, EST AFRICAN AMERICAN: 56 mL/min — AB (ref >60)
GFR, EST NON AFRICAN AMERICAN: 49 mL/min — AB (ref >60)

## 2016-05-29 MED ORDER — INFLUENZA VAC SPLIT QUAD 0.5 ML IM SUSY: 0.5000 mL | PREFILLED_SYRINGE | INTRAMUSCULAR | Status: DC

## 2016-05-29 MED ORDER — FUROSEMIDE 10 MG/ML IJ SOLN
40.0000 mg | Freq: Once | INTRAMUSCULAR | Status: AC
  Administered 2016-05-29: 40 mg via INTRAVENOUS
  Filled 2016-05-29: qty 4

## 2016-05-29 MED ORDER — INSULIN DETEMIR 100 UNIT/ML ~~LOC~~ SOLN
20.0000 [IU] | Freq: Once | SUBCUTANEOUS | Status: AC
  Administered 2016-05-29: 20 [IU] via SUBCUTANEOUS
  Filled 2016-05-29: qty 0.2

## 2016-05-29 MED ORDER — WARFARIN - PHYSICIAN DOSING INPATIENT
Freq: Every day | Status: DC
  Administered 2016-05-29 – 2016-05-30 (×2)
  Administered 2016-06-03: 1

## 2016-05-29 MED ORDER — WARFARIN SODIUM 2.5 MG PO TABS
2.5000 mg | ORAL_TABLET | Freq: Every day | ORAL | Status: DC
  Administered 2016-05-29: 2.5 mg via ORAL
  Filled 2016-05-29: qty 1

## 2016-05-29 MED ORDER — PNEUMOCOCCAL VAC POLYVALENT 25 MCG/0.5ML IJ INJ: 0.5000 mL | INJECTION | INTRAMUSCULAR | Status: DC | PRN

## 2016-05-29 MED ORDER — SODIUM CHLORIDE 0.9 % IV SOLN
Freq: Once | INTRAVENOUS | Status: AC
  Administered 2016-05-29: 12:00:00 via INTRAVENOUS

## 2016-05-29 MED ORDER — FUROSEMIDE 10 MG/ML IJ SOLN
40.0000 mg | Freq: Once | INTRAMUSCULAR | Status: AC
  Administered 2016-05-29: 40 mg via INTRAVENOUS

## 2016-05-29 MED ORDER — INSULIN ASPART 100 UNIT/ML ~~LOC~~ SOLN
0.0000 [IU] | SUBCUTANEOUS | Status: DC
  Administered 2016-05-29 (×2): 2 [IU] via SUBCUTANEOUS

## 2016-05-29 MED ORDER — ORAL CARE MOUTH RINSE
15.0000 mL | Freq: Two times a day (BID) | OROMUCOSAL | Status: DC
  Administered 2016-05-29 – 2016-06-04 (×6): 15 mL via OROMUCOSAL

## 2016-05-29 MED ORDER — SODIUM CHLORIDE 0.9 % IV SOLN: 250.0000 mL | INTRAVENOUS | Status: DC

## 2016-05-29 MED ORDER — INFLUENZA VAC SPLIT QUAD 0.5 ML IM SUSY: 0.5000 mL | PREFILLED_SYRINGE | INTRAMUSCULAR | Status: DC | PRN

## 2016-05-29 MED ORDER — PNEUMOCOCCAL VAC POLYVALENT 25 MCG/0.5ML IJ INJ: 0.5000 mL | INJECTION | INTRAMUSCULAR | Status: DC

## 2016-05-29 NOTE — Progress Notes (Signed)
Post extubation ABG pH 7.28, pCO2 46.6, pO2 189. Decreased from 4 to 2L [place]. Will do intensive pulmonary toilet and recheck ABG at 0300.

## 2016-05-29 NOTE — Progress Notes (Addendum)
[address]       [place] [address]             [phone]        CARDIOTHORACIC SURGERY PROGRESS NOTE   R1 Day Post-Op Procedure(s) (LRB): MITRAL VALVE (MV) REPLACEMENT USING 29 MM CARBOMEDICS OPTIFORM MECHANICAL BILEAFLET PROSTHETIC HEART VALVE (N/A) CORONARY ARTERY BYPASS GRAFTING (CABG) x 1 WITH ENDOSCOPIC HARVESTING OF RIGHT SAPHENOUS VEIN, EVH- PDA (N/A) MAZE PROCEDURE AND APPLICATION OF  ATRICLIP LAA PROCLIP II 45 MM (N/A) TRANSESOPHAGEAL ECHOCARDIOGRAM (TEE) (N/A)  Subjective: Sleepy but arousable.  Follows simple commands but goes right back to sleep.  Breathing looks comfortable on BiPAP  Objective: Vital signs: BP Readings from Last 1 Encounters:  05/29/16 (!) 87/52   Pulse Readings from Last 1 Encounters:  05/29/16 65   Resp Readings from Last 1 Encounters:  05/29/16 15   Temp Readings from Last 1 Encounters:  05/29/16 98.8 F (37.1 C) (Core (Comment))    Hemodynamics: PAP: (43-81)/(19-38) 46/19 CO:  [1.9 L/min-4 L/min] 3.5 L/min CI:  [1.1 L/min/m2-2.3 L/min/m2] 2 L/min/m2  Physical Exam:  Rhythm:   Junctional - AAI pacing  Breath sounds: Shallow but clear  Heart sounds:  RRR w/ mechanical sounds  Incisions:  Dressings dry, intact  Abdomen:  Soft, non-distended, non-tender  Extremities:  Warm, well-perfused  Chest tubes:  low volume thin serosanguinous output, no air leak    Intake/Output from previous day: 01/19 0701 - 01/20 0700 In: 7257.1 [P.O.:10; I.V.:4499.1; Blood:1332; NG/GT:30; IV Piggyback:1386] Out: 8110 [OVZCH:8850; Emesis/NG output:20; Blood:1685; Chest Tube:860] Intake/Output this shift: No intake/output data recorded.  Lab Results:  CBC: Recent Labs  05/28/16 2230 05/29/16 0359  WBC 9.2 16.4*  HGB 8.3*  8.8* 8.2*  HCT 25.8*  26.0* 25.3*  PLT 100* 130*    BMET:  Recent Labs  05/28/16 2230 05/29/16 0359  NA 143  146* 145  K 3.7  3.6 4.1  CL 110  109 117*  CO2 21* 23  GLUCOSE 213*   215* 127*  BUN 6  5* 5*  CREATININE 0.78  0.60 0.82  CALCIUM 8.3* 8.4*     PT/INR:   Recent Labs  05/28/16 1630  LABPROT 19.2*  INR 1.60    CBG (last 3)   Recent Labs  05/29/16 0505 05/29/16 0620 05/29/16 0716  GLUCAP 116* 121* 115*    ABG    Component Value Date/Time   PHART 7.300 (L) 05/29/2016 0528   PCO2ART 46.2 05/29/2016 0528   PO2ART 185.0 (H) 05/29/2016 0528   HCO3 22.7 05/29/2016 0528   TCO2 24 05/29/2016 0528   ACIDBASEDEF 3.0 (H) 05/29/2016 0528   O2SAT 99.0 05/29/2016 0528    CXR: PORTABLE CHEST 1 VIEW  COMPARISON:  May 28, 2016  FINDINGS: The left apex was not completely included on the study. Within this limitation, no pneumothorax. The ET and NG tubes have been removed. Other support apparatus is stable. Mild bibasilar atelectasis. No overt edema. No other acute abnormalities or changes.  IMPRESSION: 1. Removal of ET tube. Other support apparatus stable. Mild bibasilar atelectasis.   Electronically Signed   By: Dorise Bullion III M.D   On: 05/29/2016 08:25   Assessment/Plan: S/P Procedure(s) (LRB): MITRAL VALVE (MV) REPLACEMENT USING 29 MM CARBOMEDICS OPTIFORM MECHANICAL BILEAFLET PROSTHETIC HEART VALVE (N/A) CORONARY ARTERY BYPASS GRAFTING (CABG) x 1 WITH ENDOSCOPIC HARVESTING OF RIGHT SAPHENOUS VEIN, EVH- PDA (N/A) MAZE PROCEDURE AND APPLICATION OF  ATRICLIP LAA PROCLIP II 45  MM (N/A) TRANSESOPHAGEAL ECHOCARDIOGRAM (TEE) (N/A)  Overall stable POD1 Maintaining AAI paced rhythm w/ stable hemodynamics on low dose milrinone Breathing comfortably w/ O2 sats 100% although still on BiPAP due to somnolence that has persisted since surgery, mild respiratory acidosis Expected post op acute blood loss anemia, Hgb 8.2 this morning Acute on chronic diastolic CHF with expected post-op volume excess, mild Post op thrombocytopenia with h/o chronic thrombocytopenia preop, platelet count up 130k this morning   Hold sedation and pain  medications until more alert  Continue BiPAP for now  Recheck ABG later to make certain resp acidosis resolving  Mobilize  D/C lines  Leave chest tubes in for now  Keep NPO until more alert  Transfuse 1 unit PRBC's   Rexene Alberts, MD 05/29/2016 8:30 AM

## 2016-05-29 NOTE — Progress Notes (Signed)
After an hour of intensive pulmonary toilet, pt's f/u ABG worsened to pH 7.255, pCO2 50.6, pO2 96. Dr. Roxy Manns notified, he gave orders for BiPAP. Pt has hx OSA.  RRT notified to place pt on BiPAP. Will continue to monitor.

## 2016-05-29 NOTE — Progress Notes (Signed)
NIF -20, VC .65L

## 2016-05-29 NOTE — Progress Notes (Signed)
Pt placed on CPAP/PS per rapid wean protocol. Will draw ABG in 54min.

## 2016-05-29 NOTE — Procedures (Signed)
Extubation Procedure Note  Patient Details:   Name: Marie Malone DOB: [birth year] MRN: [id number]   Airway Documentation:     Evaluation  O2 sats: stable throughout Complications: No apparent complications Patient did tolerate procedure well. Bilateral Breath Sounds: Clear   Yes  Patient extubated to [facility] with sats of 99%. Patient has adequate cough and is able to speak.  Blanchie Serve 05/29/2016, 12:50 AM

## 2016-05-29 NOTE — Progress Notes (Signed)
TCTS BRIEF SICU PROGRESS NOTE  1 Day Post-Op  S/P Procedure(s) (LRB): MITRAL VALVE (MV) REPLACEMENT USING 29 MM CARBOMEDICS OPTIFORM MECHANICAL BILEAFLET PROSTHETIC HEART VALVE (N/A) CORONARY ARTERY BYPASS GRAFTING (CABG) x 1 WITH ENDOSCOPIC HARVESTING OF RIGHT SAPHENOUS VEIN, EVH- PDA (N/A) MAZE PROCEDURE AND APPLICATION OF  ATRICLIP LAA PROCLIP II 45 MM (N/A) TRANSESOPHAGEAL ECHOCARDIOGRAM (TEE) (N/A)   Stable day.  Much more alert this afternoon Paced rhythm w/ stable hemodynamics off all drips Breathing comfortably w/ O2 sats 100% on 4 L/min UOP adequate Labs okay  Plan: Continue current plan  Rexene Alberts, MD 05/29/2016 6:18 PM

## 2016-05-29 NOTE — Progress Notes (Signed)
milirone weaned slowly per MD orders. While weaning CI maintained 1.9. One hr after milirone stopped CI was 1.7. PAP 60s/20s. MD notified and wants pt monitored. No new orders given at this time Will continue to monitor and assess patient

## 2016-05-30 ENCOUNTER — Inpatient Hospital Stay (HOSPITAL_COMMUNITY): Payer: BC Managed Care – PPO

## 2016-05-30 LAB — GLUCOSE, CAPILLARY
GLUCOSE-CAPILLARY: 109 mg/dL — AB (ref 65–99)
GLUCOSE-CAPILLARY: 111 mg/dL — AB (ref 65–99)
GLUCOSE-CAPILLARY: 112 mg/dL — AB (ref 65–99)
GLUCOSE-CAPILLARY: 113 mg/dL — AB (ref 65–99)
GLUCOSE-CAPILLARY: 117 mg/dL — AB (ref 65–99)
Glucose-Capillary: 119 mg/dL — ABNORMAL HIGH (ref 65–99)

## 2016-05-30 LAB — BASIC METABOLIC PANEL
Anion gap: 7 (ref 5–15)
BUN: 13 mg/dL (ref 4–21)
BUN: 13 mg/dL (ref 6–20)
CALCIUM: 8.8 mg/dL — AB (ref 8.9–10.3)
CO2: 23 mmol/L (ref 22–32)
Chloride: 113 mmol/L — ABNORMAL HIGH (ref 101–111)
Creatinine, Ser: 1.41 mg/dL — ABNORMAL HIGH (ref 0.44–1.00)
Creatinine: 1.4 mg/dL — AB (ref 0.5–1.1)
GFR calc Af Amer: 45 mL/min — ABNORMAL LOW (ref >60)
GFR, EST NON AFRICAN AMERICAN: 38 mL/min — AB (ref >60)
GLUCOSE: 130 mg/dL
GLUCOSE: 130 mg/dL — AB (ref 65–99)
POTASSIUM: 4.4 mmol/L (ref 3.4–5.3)
Potassium: 4.4 mmol/L (ref 3.5–5.1)
Sodium: 143 mmol/L (ref 135–145)
Sodium: 143 mmol/L (ref 137–147)

## 2016-05-30 LAB — TYPE AND SCREEN
Blood Product Expiration Date: 201801252359
Blood Product Expiration Date: 201802072359
Blood Product Expiration Date: 201802072359
ISSUE DATE / TIME: 201801191104
ISSUE DATE / TIME: 201801201125
ISSUE DATE / TIME: 201801191104
UNIT TYPE AND RH: 6200
UNIT TYPE AND RH: 6200
Unit Type and Rh: 6200

## 2016-05-30 LAB — COOXEMETRY PANEL
Carboxyhemoglobin: 0.9 % (ref 0.5–1.5)
Methemoglobin: 1 % (ref 0.0–1.5)
O2 SAT: 74.9 %
Total hemoglobin: 8.8 g/dL — ABNORMAL LOW (ref 12.0–16.0)

## 2016-05-30 LAB — POCT I-STAT 3, ART BLOOD GAS (G3+)
Acid-base deficit: 1 mmol/L (ref 0.0–2.0)
Bicarbonate: 24.4 mmol/L (ref 20.0–28.0)
O2 SAT: 100 %
PCO2 ART: 43.7 mmHg (ref 32.0–48.0)
PH ART: 7.351 (ref 7.350–7.450)
PO2 ART: 237 mmHg — AB (ref 83.0–108.0)
Patient temperature: 97.5
TCO2: 26 mmol/L (ref 0–100)

## 2016-05-30 LAB — CBC AND DIFFERENTIAL
HEMATOCRIT: 29 % — AB (ref 36–46)
Hemoglobin: 9 g/dL — AB (ref 12.0–16.0)
PLATELETS: 88 10*3/uL — AB (ref 150–399)
WBC: 18.4 10^3/mL

## 2016-05-30 LAB — PROTIME-INR
INR: 2.21
PROTHROMBIN TIME: 24.9 s — AB (ref 11.4–15.2)
Protime: 24.9 seconds — AB (ref 10.0–13.8)

## 2016-05-30 LAB — CBC
HCT: 28.1 % — ABNORMAL LOW (ref 36.0–46.0)
Hemoglobin: 9 g/dL — ABNORMAL LOW (ref 12.0–15.0)
MCH: 27.2 pg (ref 26.0–34.0)
MCHC: 32 g/dL (ref 30.0–36.0)
MCV: 84.9 fL (ref 78.0–100.0)
Platelets: 88 10*3/uL — ABNORMAL LOW (ref 150–400)
RBC: 3.31 MIL/uL — ABNORMAL LOW (ref 3.87–5.11)
RDW: 17.9 % — AB (ref 11.5–15.5)
WBC: 18.4 10*3/uL — ABNORMAL HIGH (ref 4.0–10.5)

## 2016-05-30 LAB — POCT INR: INR: 2.2 — AB (ref 0.9–1.1)

## 2016-05-30 MED ORDER — FUROSEMIDE 10 MG/ML IJ SOLN
40.0000 mg | Freq: Once | INTRAMUSCULAR | Status: AC
  Administered 2016-05-30: 40 mg via INTRAVENOUS
  Filled 2016-05-30: qty 4

## 2016-05-30 MED ORDER — DOPAMINE-DEXTROSE 3.2-5 MG/ML-% IV SOLN
0.0000 ug/kg/min | INTRAVENOUS | Status: DC
  Administered 2016-05-30: 3 ug/kg/min via INTRAVENOUS
  Filled 2016-05-30: qty 250

## 2016-05-30 MED ORDER — MOVING RIGHT ALONG BOOK
Freq: Once | Status: AC
  Administered 2016-05-30: 10:00:00
  Filled 2016-05-30: qty 1

## 2016-05-30 MED ORDER — FUROSEMIDE 10 MG/ML IJ SOLN
40.0000 mg | Freq: Four times a day (QID) | INTRAMUSCULAR | Status: AC
Start: 2016-05-30 — End: 2016-05-30
  Administered 2016-05-30 (×3): 40 mg via INTRAVENOUS
  Filled 2016-05-30 (×3): qty 4

## 2016-05-30 MED ORDER — SODIUM CHLORIDE 0.9 % IV SOLN: 250.0000 mL | INTRAVENOUS | Status: DC | PRN

## 2016-05-30 MED ORDER — SODIUM CHLORIDE 0.9% FLUSH
3.0000 mL | Freq: Two times a day (BID) | INTRAVENOUS | Status: DC
  Administered 2016-05-30 – 2016-06-04 (×8): 3 mL via INTRAVENOUS

## 2016-05-30 MED ORDER — SODIUM CHLORIDE 0.9% FLUSH: 3.0000 mL | INTRAVENOUS | Status: DC | PRN

## 2016-05-30 MED ORDER — ASPIRIN EC 81 MG PO TBEC
81.0000 mg | DELAYED_RELEASE_TABLET | Freq: Every day | ORAL | Status: DC
  Administered 2016-05-30 – 2016-06-04 (×6): 81 mg via ORAL
  Filled 2016-05-30 (×6): qty 1

## 2016-05-30 NOTE — Progress Notes (Addendum)
[address]       [place] [address]             [phone]        CARDIOTHORACIC SURGERY PROGRESS NOTE   R2 Days Post-Op Procedure(s) (LRB): MITRAL VALVE (MV) REPLACEMENT USING 29 MM CARBOMEDICS OPTIFORM MECHANICAL BILEAFLET PROSTHETIC HEART VALVE (N/A) CORONARY ARTERY BYPASS GRAFTING (CABG) x 1 WITH ENDOSCOPIC HARVESTING OF RIGHT SAPHENOUS VEIN, EVH- PDA (N/A) MAZE PROCEDURE AND APPLICATION OF  ATRICLIP LAA PROCLIP II 45 MM (N/A) TRANSESOPHAGEAL ECHOCARDIOGRAM (TEE) (N/A)  Subjective: Feels better this morning.  Wants to get up and out of bed.  Denies SOB  Objective: Vital signs: BP Readings from Last 1 Encounters:  05/30/16 (!) 88/67   Pulse Readings from Last 1 Encounters:  05/30/16 81   Resp Readings from Last 1 Encounters:  05/30/16 (!) 39   Temp Readings from Last 1 Encounters:  05/30/16 97.3 F (36.3 C) (Oral)    Hemodynamics: PAP: (51-81)/(20-31) 70/29 CO:  [3.3 L/min-3.4 L/min] 3.4 L/min CI:  [1.9 L/min/m2-2 L/min/m2] 2 L/min/m2  Physical Exam:  Rhythm:   Junctional w/ HR 40-50 - will not AAI pace today - currently DDD pacing  Breath sounds: clear  Heart sounds:  RRR  Incisions:  Dressings dry, intact  Abdomen:  Soft, non-distended, non-tender  Extremities:  Warm, well-perfused  Chest tubes:  low volume thin serosanguinous output, no air leak    Intake/Output from previous day: 01/20 0701 - 01/21 0700 In: 2420 [P.O.:960; I.V.:1054; Blood:306; IV Piggyback:100] Out: 1950 [Urine:665; Chest Tube:500] Intake/Output this shift: No intake/output data recorded.  Lab Results:  CBC: Recent Labs  05/29/16 1717 05/30/16 0340  WBC 17.1* 18.4*  HGB 9.3*  9.2* 9.0*  HCT 29.4*  27.0* 28.1*  PLT 93* 88*    BMET:  Recent Labs  05/29/16 0359 05/29/16 1717 05/30/16 0340  NA 145 147* 143  K 4.1 4.3 4.4  CL 117* 112* 113*  CO2 23  --  23  GLUCOSE 127* 139* 130*  BUN 5* 10 13  CREATININE 0.82 1.16*  1.10* 1.41*    CALCIUM 8.4*  --  8.8*     PT/INR:   Recent Labs  05/30/16 0340  LABPROT 24.9*  INR 2.21    CBG (last 3)   Recent Labs  05/29/16 2312 05/30/16 0309 05/30/16 0731  GLUCAP 129* 119* 111*    ABG    Component Value Date/Time   PHART 7.351 05/30/2016 0335   PCO2ART 43.7 05/30/2016 0335   PO2ART 237.0 (H) 05/30/2016 0335   HCO3 24.4 05/30/2016 0335   TCO2 26 05/30/2016 0335   ACIDBASEDEF 1.0 05/30/2016 0335   O2SAT 100.0 05/30/2016 0335    CXR: PORTABLE CHEST 1 VIEW  COMPARISON:  May 29, 2016  FINDINGS: The PA catheter has been removed with only a left IJ sheath remaining. The chest tubes and mediastinal drain are stable. No pneumothorax. Small bilateral effusions and underlying atelectasis are unchanged. No overt edema or other change.  IMPRESSION: 1. Removal of PA catheter.  The support apparatus is stable. 2. Small effusions and underlying atelectasis in the bases are unchanged.   Electronically Signed   By: Dorise Bullion III M.D   On: 05/30/2016 08:18   Assessment/Plan: S/P Procedure(s) (LRB): MITRAL VALVE (MV) REPLACEMENT USING 29 MM CARBOMEDICS OPTIFORM MECHANICAL BILEAFLET PROSTHETIC HEART VALVE (N/A) CORONARY ARTERY BYPASS GRAFTING (CABG) x 1 WITH ENDOSCOPIC HARVESTING OF RIGHT SAPHENOUS VEIN, EVH- PDA (N/A) MAZE PROCEDURE AND  APPLICATION OF  ATRICLIP LAA PROCLIP II 45 MM (N/A) TRANSESOPHAGEAL ECHOCARDIOGRAM (TEE) (N/A)  Overall stable POD2 Maintaining DDD paced rhythm w/ stable BP off all drips Breathing comfortably w/ O2 sats 96-97% on nasal cannula, respiratory acidosis resolved Expected post op acute blood loss anemia, Hgb 9.0 this morning Acute on chronic diastolic CHF with expected post-op volume excess, I/O's positive yesterday and weight 7 lbs > preop Acute kidney injury w/ creatinine up slightly 1.4 this morning, likely due to prerenal azotemia, UOP adequate but modest response to lasix Post op thrombocytopenia with h/o  chronic thrombocytopenia preop, platelet count down 88k this morning INR up to 2.2 today   Continue DDD pacing and hold beta blockers, amiodarone  Add low dose dopamine to stimulate HR and UOP  Mobilize  Diuresis  D/C chest tubes  Watch renal function  No Coumadin today  Rexene Alberts, MD 05/30/2016 9:29 AM

## 2016-05-30 NOTE — Progress Notes (Signed)
MD called to make aware after starting Dopamine at 59mcg, pt became tachycardic in 110s-120s with elevated bp 150s/70s. Md wanted Dopamine weaned down and see how pt tolerated. If pt able to tolerated weaning and HR normalizes and BP decreases leave dopamine on the that dose. If pt still have tachycardia and increased BPs then wean Dopamine off. Will cont to monitor and assess

## 2016-05-30 NOTE — Plan of Care (Signed)
Problem: Respiratory: Goal: Levels of oxygenation will improve Outcome: Progressing Pt post-extubation required BIPAP d/t consistent lethargy that improved gradually, Pt converted to [place] 2L with O2 sats 99-100%. Pt with weak, NP cough, minimal volumes on I.S Will continue to monitor and assess.

## 2016-05-30 NOTE — Progress Notes (Signed)
  TCTS BRIEF SICU PROGRESS NOTE  2 Days Post-Op  S/P Procedure(s) (LRB): MITRAL VALVE (MV) REPLACEMENT USING 29 MM CARBOMEDICS OPTIFORM MECHANICAL BILEAFLET PROSTHETIC HEART VALVE (N/A) CORONARY ARTERY BYPASS GRAFTING (CABG) x 1 WITH ENDOSCOPIC HARVESTING OF RIGHT SAPHENOUS VEIN, EVH- PDA (N/A) MAZE PROCEDURE AND APPLICATION OF  ATRICLIP LAA PROCLIP II 45 MM (N/A) TRANSESOPHAGEAL ECHOCARDIOGRAM (TEE) (N/A)   Stable day but became hypertensive and tachycardic on dopamine Back to junctional rhythm w/ normal BP after dopamine stopped Currently DDD pacing and breathing comfortably UOP increased while on dopamine, now back down  Plan: Will check mixed venous co-ox.  Continue current plan as long as co-ox okay  Rexene Alberts, MD 05/30/2016 5:20 PM

## 2016-05-31 ENCOUNTER — Inpatient Hospital Stay (HOSPITAL_COMMUNITY): Payer: BC Managed Care – PPO

## 2016-05-31 LAB — PROTIME-INR
INR: 2.86
PROTHROMBIN TIME: 30.6 s — AB (ref 11.4–15.2)
PROTIME: 30.6 s — AB (ref 10.0–13.8)

## 2016-05-31 LAB — POCT I-STAT 3, ART BLOOD GAS (G3+)
ACID-BASE DEFICIT: 2 mmol/L (ref 0.0–2.0)
BICARBONATE: 23.9 mmol/L (ref 20.0–28.0)
O2 SAT: 98 %
PCO2 ART: 48.9 mmHg — AB (ref 32.0–48.0)
PH ART: 7.297 — AB (ref 7.350–7.450)
PO2 ART: 122 mmHg — AB (ref 83.0–108.0)
Patient temperature: 37
TCO2: 25 mmol/L (ref 0–100)

## 2016-05-31 LAB — COOXEMETRY PANEL
CARBOXYHEMOGLOBIN: 0.9 % (ref 0.5–1.5)
METHEMOGLOBIN: 1 % (ref 0.0–1.5)
O2 SAT: 66.8 %
TOTAL HEMOGLOBIN: 8.3 g/dL — AB (ref 12.0–16.0)

## 2016-05-31 LAB — POCT I-STAT, CHEM 8
BUN: 6 mg/dL (ref 6–20)
CHLORIDE: 105 mmol/L (ref 101–111)
CREATININE: 0.4 mg/dL — AB (ref 0.44–1.00)
Calcium, Ion: 0.99 mmol/L — ABNORMAL LOW (ref 1.15–1.40)
GLUCOSE: 90 mg/dL (ref 65–99)
HCT: 21 % — ABNORMAL LOW (ref 36.0–46.0)
Hemoglobin: 7.1 g/dL — ABNORMAL LOW (ref 12.0–15.0)
POTASSIUM: 6.2 mmol/L — AB (ref 3.5–5.1)
Sodium: 142 mmol/L (ref 135–145)
TCO2: 27 mmol/L (ref 0–100)

## 2016-05-31 LAB — CBC AND DIFFERENTIAL
HEMATOCRIT: 26 % — AB (ref 36–46)
Hemoglobin: 8.2 g/dL — AB (ref 12.0–16.0)
Platelets: 77 10*3/uL — AB (ref 150–399)
WBC: 7.2 10^3/mL

## 2016-05-31 LAB — PREPARE FRESH FROZEN PLASMA
UNIT DIVISION: 0
UNIT DIVISION: 0
UNIT DIVISION: 0
Unit division: 0

## 2016-05-31 LAB — GLUCOSE, CAPILLARY: GLUCOSE-CAPILLARY: 96 mg/dL (ref 65–99)

## 2016-05-31 LAB — BASIC METABOLIC PANEL
ANION GAP: 11 (ref 5–15)
BUN: 18 mg/dL (ref 4–21)
BUN: 18 mg/dL (ref 6–20)
CHLORIDE: 107 mmol/L (ref 101–111)
CO2: 23 mmol/L (ref 22–32)
Calcium: 8.8 mg/dL — ABNORMAL LOW (ref 8.9–10.3)
Creatinine, Ser: 1.08 mg/dL — ABNORMAL HIGH (ref 0.44–1.00)
Creatinine: 1.1 mg/dL (ref 0.5–1.1)
GFR, EST NON AFRICAN AMERICAN: 53 mL/min — AB (ref >60)
Glucose, Bld: 111 mg/dL — ABNORMAL HIGH (ref 65–99)
Glucose: 111 mg/dL
POTASSIUM: 3.8 mmol/L (ref 3.4–5.3)
POTASSIUM: 3.8 mmol/L (ref 3.5–5.1)
SODIUM: 141 mmol/L (ref 135–145)
Sodium: 141 mmol/L (ref 137–147)

## 2016-05-31 LAB — CBC
HCT: 25.7 % — ABNORMAL LOW (ref 36.0–46.0)
HEMOGLOBIN: 8.2 g/dL — AB (ref 12.0–15.0)
MCH: 27.1 pg (ref 26.0–34.0)
MCHC: 31.9 g/dL (ref 30.0–36.0)
MCV: 84.8 fL (ref 78.0–100.0)
PLATELETS: 77 10*3/uL — AB (ref 150–400)
RBC: 3.03 MIL/uL — AB (ref 3.87–5.11)
RDW: 17.6 % — ABNORMAL HIGH (ref 11.5–15.5)
WBC: 17.2 10*3/uL — AB (ref 4.0–10.5)

## 2016-05-31 LAB — POCT INR: INR: 2.9 — AB (ref 0.9–1.1)

## 2016-05-31 MED ORDER — BOOST / RESOURCE BREEZE PO LIQD
1.0000 | Freq: Three times a day (TID) | ORAL | Status: DC
  Administered 2016-05-31 – 2016-06-04 (×11): 1 via ORAL

## 2016-05-31 MED ORDER — ATORVASTATIN CALCIUM 80 MG PO TABS
80.0000 mg | ORAL_TABLET | Freq: Every day | ORAL | Status: DC
  Administered 2016-05-31 – 2016-06-03 (×4): 80 mg via ORAL
  Filled 2016-05-31 (×4): qty 1

## 2016-05-31 MED ORDER — FUROSEMIDE 10 MG/ML IJ SOLN
40.0000 mg | Freq: Four times a day (QID) | INTRAMUSCULAR | Status: AC
  Administered 2016-05-31 (×3): 40 mg via INTRAVENOUS
  Filled 2016-05-31 (×3): qty 4

## 2016-05-31 MED ORDER — SODIUM CHLORIDE 0.9 % IV SOLN
30.0000 meq | Freq: Once | INTRAVENOUS | Status: AC
  Administered 2016-05-31: 30 meq via INTRAVENOUS
  Filled 2016-05-31: qty 15

## 2016-05-31 MED ORDER — ACETAMINOPHEN 325 MG PO TABS: 650.0000 mg | ORAL_TABLET | Freq: Four times a day (QID) | ORAL | Status: DC | PRN

## 2016-05-31 MED ORDER — AMIODARONE HCL 200 MG PO TABS
200.0000 mg | ORAL_TABLET | Freq: Every day | ORAL | Status: DC
Start: 2016-05-31 — End: 2016-06-04
  Administered 2016-05-31 – 2016-06-04 (×5): 200 mg via ORAL
  Filled 2016-05-31 (×6): qty 1

## 2016-05-31 MED FILL — Sodium Bicarbonate IV Soln 8.4%: INTRAVENOUS | Qty: 50 | Status: AC

## 2016-05-31 MED FILL — Heparin Sodium (Porcine) Inj 1000 Unit/ML: INTRAMUSCULAR | Qty: 10 | Status: AC

## 2016-05-31 MED FILL — Lidocaine HCl IV Inj 20 MG/ML: INTRAVENOUS | Qty: 5 | Status: AC

## 2016-05-31 MED FILL — Electrolyte-R (PH 7.4) Solution: INTRAVENOUS | Qty: 4000 | Status: AC

## 2016-05-31 MED FILL — Mannitol IV Soln 20%: INTRAVENOUS | Qty: 500 | Status: AC

## 2016-05-31 MED FILL — Sodium Chloride IV Soln 0.9%: INTRAVENOUS | Qty: 2000 | Status: AC

## 2016-05-31 NOTE — Progress Notes (Signed)
Initial Nutrition Assessment  DOCUMENTATION CODES:   Not applicable  INTERVENTION:    Boost Breeze po TID, each supplement provides 250 kcal and 9 grams of protein  NUTRITION DIAGNOSIS:   Inadequate oral intake related to poor appetite as evidenced by per patient/family report  GOAL:   Patient will meet greater than or equal to 90% of their needs  MONITOR:   PO intake, Supplement acceptance, Labs, Weight trends, I & O's  REASON FOR ASSESSMENT:   Consult Poor PO  ASSESSMENT:   66 yo Female with rheumatic heart disease including mitral stenosis status post balloon mitral valvuloplasty in 2122, chronic diastolic CHF, recurrent paroxysmal atrial fibrillation, 2 previous embolic strokes; presented for surgical intervention with diagnosis of severe MS.   Pt s/p procedures  MITRAL VALVE REPLACEMENT  CORONARY ARTERY BYPASS GRAFTING (CABG) x 1 MAZE PROCEDURE   Pt sleeping upon RD visit; spoke with pt's daughter. Reports pt's appetite hasn't been very good recently >> eating well PTA. Currently on Clear Liquids >> PO intake 10% per flowsheets. Daughter feels pt has lost "a little bit" of weight, however, no significant amount. Will order Boost Breeze TID >> daughter agreeable.  Nutrition focused physical exam completed.  No muscle or subcutaneous fat depletion noticed.  Diet Order:  Diet clear liquid Room service appropriate? Yes; Fluid consistency: Thin  Skin:  Reviewed, no issues  Last BM:  PTA  Height:   Ht Readings from Last 1 Encounters:  05/29/16 5\' 2"  (1.575 m)    Weight:   Wt Readings from Last 1 Encounters:  05/31/16 158 lb 11.7 oz (72 kg)    Ideal Body Weight:  50 kg  BMI:  Body mass index is 29.03 kg/m.  Estimated Nutritional Needs:   Kcal:  1800-2000  Protein:  90-100 gm  Fluid:  1.8-2.0 L  EDUCATION NEEDS:   No education needs identified at this time  Arthur Holms, RD, LDN Pager #: [phone] After-Hours Pager #: [phone]

## 2016-05-31 NOTE — Care Management Note (Addendum)
Case Management Note  Patient Details  Name: Marie Malone MRN: [id number] Date of Birth: November 22, 1950  Subjective/Objective:    Pt is s/p MVR and CABG                Action/Plan:   PTA independent from home with husband.  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  Daughter also at bedside and voiced concerns with pts husband being able to provide adequate supervision - [place] is from [place] of [place],  CM explained to that supervision is to be able to contact emergency help if needed.  PT eval ordered   Expected Discharge Date:                  Expected Discharge Plan:     In-House Referral:  Clinical Social Work  Discharge planning Services  CM Consult  Post Acute Care Choice:    Choice offered to:     DME Arranged:    DME Agency:     HH Arranged:    HH Agency:     Status of Service:  In process, will continue to follow  If discussed at Long Length of Stay Meetings, dates discussed:    Additional Comments:  [name], RN 05/31/2016, 11:13 AM

## 2016-05-31 NOTE — Plan of Care (Signed)
Problem: Urinary Elimination: Goal: Ability to achieve and maintain adequate renal perfusion and functioning will improve Outcome: Progressing Pt had marginal urine output POD 2-3 with increase of creatinine to 1.4. Ordered Lasix on POD 2 had minimal results. Brief attempt at renal dose dopamine as ordered resulted in hemodynamic instability and was weaned to off. A 2nd order for Lasix 40mg  IV every 6 hours times 4 dose improved urine output and decrease in creatinine to 1.08. Will continue to monitor and evaluate.

## 2016-05-31 NOTE — Progress Notes (Signed)
[address]       [place] [address]             [phone]        CARDIOTHORACIC SURGERY PROGRESS NOTE   R3 Days Post-Op Procedure(s) (LRB): MITRAL VALVE (MV) REPLACEMENT USING 29 MM CARBOMEDICS OPTIFORM MECHANICAL BILEAFLET PROSTHETIC HEART VALVE (N/A) CORONARY ARTERY BYPASS GRAFTING (CABG) x 1 WITH ENDOSCOPIC HARVESTING OF RIGHT SAPHENOUS VEIN, EVH- PDA (N/A) MAZE PROCEDURE AND APPLICATION OF  ATRICLIP LAA PROCLIP II 45 MM (N/A) TRANSESOPHAGEAL ECHOCARDIOGRAM (TEE) (N/A)  Subjective: Patient feeling better this morning.  Reportedly she has been very sensitive to narcotic pain relievers - causing somnolence.  None given overnight.  Currently denies pain, SOB.  Very weak.  Poor appetite.  Objective: Vital signs: BP Readings from Last 1 Encounters:  05/31/16 (!) 148/51   Pulse Readings from Last 1 Encounters:  05/31/16 79   Resp Readings from Last 1 Encounters:  05/31/16 17   Temp Readings from Last 1 Encounters:  05/31/16 97.8 F (36.6 C) (Oral)    Hemodynamics:    Physical Exam:  Rhythm:   sinus  Breath sounds: Diminished at bases, otherwise clear  Heart sounds:  RRR  Incisions:  Clean and dry  Abdomen:  Soft, non-distended, non-tender  Extremities:  Warm, well-perfused   Intake/Output from previous day: 01/21 0701 - 01/22 0700 In: 997.7 [P.O.:510; I.V.:487.7] Out: 2085 [Urine:2025; Chest Tube:60] Intake/Output this shift: Total I/O In: -  Out: 70 [Urine:70]  Lab Results:  CBC: Recent Labs  05/30/16 0340 05/31/16 0347  WBC 18.4* 17.2*  HGB 9.0* 8.2*  HCT 28.1* 25.7*  PLT 88* 77*    BMET:  Recent Labs  05/30/16 0340 05/31/16 0347  NA 143 141  K 4.4 3.8  CL 113* 107  CO2 23 23  GLUCOSE 130* 111*  BUN 13 18  CREATININE 1.41* 1.08*  CALCIUM 8.8* 8.8*     PT/INR:   Recent Labs  05/31/16 0347  LABPROT 30.6*  INR 2.86    CBG (last 3)   Recent Labs  05/30/16 2038 05/31/16 0002 05/31/16 0347  GLUCAP  112* 109* 96    ABG    Component Value Date/Time   PHART 7.351 05/30/2016 0335   PCO2ART 43.7 05/30/2016 0335   PO2ART 237.0 (H) 05/30/2016 0335   HCO3 24.4 05/30/2016 0335   TCO2 26 05/30/2016 0335   ACIDBASEDEF 1.0 05/30/2016 0335   O2SAT 66.8 05/31/2016 0410    CXR: PORTABLE CHEST 1 VIEW  COMPARISON:  05/30/2016 .  FINDINGS: Left IJ sheath in stable position. Interim removal of bilateral chest tubes and mediastinal drainage catheter. Prior CABG. Prior mitral valve replacement. Left atrial appendage clip again noted. Stable cardiomegaly. Low lung volumes with basilar atelectasis. Small bilateral pleural effusions noted. No pneumothorax.  IMPRESSION: 1. Left IJ sheath in stable position. Interim removal of bilateral chest tubes and mediastinal drainage catheter. No pneumothorax.  2. Prior CABG. Prior cardiac valve replacement. Left atrial appendage clip noted stable position. Stable cardiomegaly. No pulmonary venous congestion.  3. Low lung volumes with bibasilar atelectasis again noted. Small bilateral pleural effusions again noted.   Electronically Signed   By: Marcello Moores  Register   On: 05/31/2016 07:42  Assessment/Plan: S/P Procedure(s) (LRB): MITRAL VALVE (MV) REPLACEMENT USING 29 MM CARBOMEDICS OPTIFORM MECHANICAL BILEAFLET PROSTHETIC HEART VALVE (N/A) CORONARY ARTERY BYPASS GRAFTING (CABG) x 1 WITH ENDOSCOPIC HARVESTING OF RIGHT SAPHENOUS VEIN, EVH- PDA (N/A) MAZE PROCEDURE AND APPLICATION OF  ATRICLIP  LAA PROCLIP II 45 MM (N/A) TRANSESOPHAGEAL ECHOCARDIOGRAM (TEE) (N/A)  Overall stable POD3 Currently sinus rhythm w/ stable BP, BP increased last 24 hours Breathing comfortably w/ O2 sats 96-97% on room air Expected post op acute blood loss anemia, Hgb down slightly 8.2 this morning Acute on chronic diastolic CHF with expected post-op volume excess, I/O's negative 1 liter yesterday and weight down but still 2 lbs > preop Acute kidney injury w/ creatinine  down 1.1 this morning, UOP much improved Post op thrombocytopenia with h/o chronic thrombocytopenia preop, platelet count down 77k this morning INR up further to 2.9 today, coumadin held yesterday Physical deconditioning, severe weakness   Mobilize  Diuresis  Restart amiodarone at reduced dose  Continue to hold beta blockers for now  Watch renal function  Watch platelet count  No Coumadin again today  PT consult  Nutrition consult  Anticipate eventual need for short term SNF placement for rehab  Transfer step down  Rexene Alberts, MD 05/31/2016 8:33 AM

## 2016-06-01 ENCOUNTER — Encounter (HOSPITAL_COMMUNITY): Payer: Self-pay | Admitting: Thoracic Surgery (Cardiothoracic Vascular Surgery)

## 2016-06-01 LAB — BASIC METABOLIC PANEL
BUN: 22 mg/dL — AB (ref 4–21)
Creatinine: 0.9 mg/dL (ref 0.5–1.1)
Glucose: 108 mg/dL
Potassium: 3.4 mmol/L (ref 3.4–5.3)
SODIUM: 141 mmol/L (ref 137–147)

## 2016-06-01 LAB — PROTIME-INR
INR: 2.66
Prothrombin Time: 28.8 seconds — ABNORMAL HIGH (ref 11.4–15.2)
Protime: 28.8 seconds — AB (ref 10.0–13.8)

## 2016-06-01 LAB — PREALBUMIN: Prealbumin: 6.2 mg/dL — ABNORMAL LOW (ref 18–38)

## 2016-06-01 LAB — COMPREHENSIVE METABOLIC PANEL
ALK PHOS: 56 U/L (ref 38–126)
ALT: 43 U/L (ref 14–54)
AST: 63 U/L — AB (ref 15–41)
Albumin: 3.3 g/dL — ABNORMAL LOW (ref 3.5–5.0)
Anion gap: 10 (ref 5–15)
BUN: 22 mg/dL — AB (ref 6–20)
CALCIUM: 8.9 mg/dL (ref 8.9–10.3)
CO2: 25 mmol/L (ref 22–32)
CREATININE: 0.94 mg/dL (ref 0.44–1.00)
Chloride: 106 mmol/L (ref 101–111)
GFR calc Af Amer: >60 mL/min (ref >60)
GFR calc non Af Amer: >60 mL/min (ref >60)
GLUCOSE: 108 mg/dL — AB (ref 65–99)
Potassium: 3.4 mmol/L — ABNORMAL LOW (ref 3.5–5.1)
SODIUM: 141 mmol/L (ref 135–145)
Total Bilirubin: 2.9 mg/dL — ABNORMAL HIGH (ref 0.3–1.2)
Total Protein: 6.9 g/dL (ref 6.5–8.1)

## 2016-06-01 LAB — CBC AND DIFFERENTIAL
HCT: 25 % — AB (ref 36–46)
Hemoglobin: 7.8 g/dL — AB (ref 12.0–16.0)
PLATELETS: 120 10*3/uL — AB (ref 150–399)
WBC: 14 10^3/mL

## 2016-06-01 LAB — CBC
HCT: 24.8 % — ABNORMAL LOW (ref 36.0–46.0)
Hemoglobin: 7.8 g/dL — ABNORMAL LOW (ref 12.0–15.0)
MCH: 26.6 pg (ref 26.0–34.0)
MCHC: 31.5 g/dL (ref 30.0–36.0)
MCV: 84.6 fL (ref 78.0–100.0)
PLATELETS: 120 10*3/uL — AB (ref 150–400)
RBC: 2.93 MIL/uL — AB (ref 3.87–5.11)
RDW: 17.7 % — ABNORMAL HIGH (ref 11.5–15.5)
WBC: 14 10*3/uL — ABNORMAL HIGH (ref 4.0–10.5)

## 2016-06-01 LAB — HEPATIC FUNCTION PANEL: Bilirubin, Total: 2.9 mg/dL

## 2016-06-01 LAB — POCT INR: INR: 2.7 — AB (ref 0.9–1.1)

## 2016-06-01 MED ORDER — POTASSIUM CHLORIDE CRYS ER 20 MEQ PO TBCR
40.0000 meq | EXTENDED_RELEASE_TABLET | Freq: Once | ORAL | Status: AC
  Administered 2016-06-01: 40 meq via ORAL
  Filled 2016-06-01: qty 2

## 2016-06-01 MED ORDER — WARFARIN SODIUM 1 MG PO TABS
1.0000 mg | ORAL_TABLET | Freq: Every day | ORAL | Status: DC
  Administered 2016-06-01: 1 mg via ORAL
  Filled 2016-06-01: qty 1

## 2016-06-01 NOTE — Clinical Social Work Note (Signed)
CSW presented bed offers so far to patient, spouse, daughter, and two sisters. They have chosen [facility] and patient is agreeable. CSW contacted admissions coordinator and she will start insurance authorization.  Dayton Scrape, [address]

## 2016-06-01 NOTE — Progress Notes (Addendum)
[address]       [place] [address]             [phone]      4 Days Post-Op Procedure(s) (LRB): MITRAL VALVE (MV) REPLACEMENT USING 29 MM CARBOMEDICS OPTIFORM MECHANICAL BILEAFLET PROSTHETIC HEART VALVE (N/A) CORONARY ARTERY BYPASS GRAFTING (CABG) x 1 WITH ENDOSCOPIC HARVESTING OF RIGHT SAPHENOUS VEIN, EVH- PDA (N/A) MAZE PROCEDURE AND APPLICATION OF  ATRICLIP LAA PROCLIP II 45 MM (N/A) TRANSESOPHAGEAL ECHOCARDIOGRAM (TEE) (N/A) Subjective: Feels a little better today  Objective: Vital signs in last 24 hours: Temp:  [97.6 F (36.4 C)-98.7 F (37.1 C)] 98.2 F (36.8 C) (01/23 0259) Pulse Rate:  [73-85] 85 (01/23 0259) Cardiac Rhythm: Normal sinus rhythm;Bundle branch block;Heart block (01/22 1900) Resp:  [16-20] 16 (01/23 0259) BP: (125-157)/(49-73) 125/59 (01/23 0259) SpO2:  [94 %-100 %] 100 % (01/23 0259) Weight:  [152 lb 11.2 oz (69.3 kg)] 152 lb 11.2 oz (69.3 kg) (01/23 0259)  Hemodynamic parameters for last 24 hours:    Intake/Output from previous day: 01/22 0701 - 01/23 0700 In: 831 [P.O.:480; I.V.:86; IV Piggyback:265] Out: 2575 [Urine:2575] Intake/Output this shift: No intake/output data recorded.  General appearance: alert, cooperative and no distress Heart: regular rate and rhythm and + valve click without murmur Lungs: dim in bases, o/w clear Abdomen: benign Extremities: no edema Wound: incis healing well  Lab Results:  Recent Labs  05/31/16 0347 06/01/16 0315  WBC 17.2* 14.0*  HGB 8.2* 7.8*  HCT 25.7* 24.8*  PLT 77* 120*   BMET:  Recent Labs  05/31/16 0347 06/01/16 0315  NA 141 141  K 3.8 3.4*  CL 107 106  CO2 23 25  GLUCOSE 111* 108*  BUN 18 22*  CREATININE 1.08* 0.94  CALCIUM 8.8* 8.9    PT/INR:  Recent Labs  06/01/16 0315  LABPROT 28.8*  INR 2.66   ABG    Component Value Date/Time   PHART 7.351 05/30/2016 0335   HCO3 24.4 05/30/2016 0335   TCO2 26 05/30/2016 0335   ACIDBASEDEF 1.0 05/30/2016  0335   O2SAT 66.8 05/31/2016 0410   CBG (last 3)   Recent Labs  05/30/16 2038 05/31/16 0002 05/31/16 0347  GLUCAP 112* 109* 96    Meds Scheduled Meds: . acetaminophen  1,000 mg Oral Q6H  . amiodarone  200 mg Oral Daily  . aspirin EC  81 mg Oral Daily  . atorvastatin  80 mg Oral q1800  . bisacodyl  10 mg Oral Daily   Or  . bisacodyl  10 mg Rectal Daily  . docusate sodium  200 mg Oral Daily  . feeding supplement  1 Container Oral TID BM  . mouth rinse  15 mL Mouth Rinse BID  . pantoprazole  40 mg Oral Daily  . sodium chloride flush  3 mL Intravenous Q12H  . sodium chloride flush  3 mL Intravenous Q12H  . Warfarin - Physician Dosing Inpatient   Does not apply q1800   Continuous Infusions: . sodium chloride Stopped (05/31/16 1100)   PRN Meds:.sodium chloride, metoprolol, ondansetron (ZOFRAN) IV, sodium chloride flush, sodium chloride flush, traMADol  Xrays Dg Chest Port 1 View  Result Date: 05/31/2016 CLINICAL DATA:  MVR. EXAM: PORTABLE CHEST 1 VIEW COMPARISON:  05/30/2016 . FINDINGS: Left IJ sheath in stable position. Interim removal of bilateral chest tubes and mediastinal drainage catheter. Prior CABG. Prior mitral valve replacement. Left atrial appendage clip again noted. Stable cardiomegaly. Low lung volumes with basilar  atelectasis. Small bilateral pleural effusions noted. No pneumothorax. IMPRESSION: 1. Left IJ sheath in stable position. Interim removal of bilateral chest tubes and mediastinal drainage catheter. No pneumothorax. 2. Prior CABG. Prior cardiac valve replacement. Left atrial appendage clip noted stable position. Stable cardiomegaly. No pulmonary venous congestion. 3. Low lung volumes with bibasilar atelectasis again noted. Small bilateral pleural effusions again noted. Electronically Signed   By: Marcello Moores  Register   On: 05/31/2016 07:42    Assessment/Plan: S/P Procedure(s) (LRB): MITRAL VALVE (MV) REPLACEMENT USING 29 MM CARBOMEDICS OPTIFORM MECHANICAL  BILEAFLET PROSTHETIC HEART VALVE (N/A) CORONARY ARTERY BYPASS GRAFTING (CABG) x 1 WITH ENDOSCOPIC HARVESTING OF RIGHT SAPHENOUS VEIN, EVH- PDA (N/A) MAZE PROCEDURE AND APPLICATION OF  ATRICLIP LAA PROCLIP II 45 MM (N/A) TRANSESOPHAGEAL ECHOCARDIOGRAM (TEE) (N/A)  1 progressing well overall 2 sinus rhythm with short episode of afib- cont low dose amiodarone 3 decreased K+, replace, UOP good, does not appear to need further diuresis at this time Renal fxn is normal 4 no coumadin today- follow INR daily 5 thrombocytopenia and leukocytosis improved, H/H pretty stable- cont to follow 6 PT / cardiac regab 7 pulm toilet  8 add low dose ace-I for htn 9 SW consult for ST-SNF at D/C for rehab   LOS: 4 days    GOLD,WAYNE E 06/01/2016  I have seen and examined the patient and agree with the assessment and plan as outlined.  Will give Coumadin 1 mg tonight.  Rexene Alberts, MD 06/01/2016 9:06 AM

## 2016-06-01 NOTE — Care Management Note (Signed)
Case Management Note Previous CM note initiated by [name], RN 05/31/2016, 11:13 AM   Patient Details  Name: Marie Malone MRN: [id number] Date of Birth: [birth year]  Subjective/Objective:    Pt is s/p MVR and CABG                Action/Plan:   PTA independent from home with husband.  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  Daughter also at bedside and voiced concerns with pts husband being able to provide adequate supervision - daughter is from out of state,  CM explained to that supervision is to be able to contact emergency help if needed.  PT eval ordered   Expected Discharge Date:                  Expected Discharge Plan:  [place]  In-House Referral:  Clinical Social Work  Discharge planning Services  CM Consult  Post Acute Care Choice:    Choice offered to:     DME Arranged:    DME Agency:     HH Arranged:    [place] Agency:     Status of Service:  In process, will continue to follow  If discussed at Long Length of Stay Meetings, dates discussed:    Additional Comments:  06/01/16- 1130- Marvetta Gibbons RN, CM- pt tx from 2S to 2W on 05/31/16- spoke with pt and daughter at bedside- regarding d/c plans- daughter would like to look at [facility] at discharge- PT eval pending this AM- explained to daughter process for STSNF- and need for insurance approval- pt has spouse at home but there are concerns that spouse able to provide needed supervision due to his own cognitive difficulties- pt's has sisters that can provide some assistance however would be best if pt went to rehab first to maximize independence prior to returning home.- CM to follow after PT eval completed and consult CSW for possible placement needs.   Dahlia Client [place], RN 06/01/2016, 11:47 AM [phone]

## 2016-06-01 NOTE — NC FL2 (Signed)
[place] MEDICAID FL2 LEVEL OF CARE SCREENING TOOL     IDENTIFICATION  Patient Name: Marie Malone Birthdate: 1951-01-13 Sex: female Admission Date (Current Location): 05/28/2016  [facility] and [place] Number:  Herbalist and Address:  [facility], [place] [address] [place], [place] [address]      Provider Number: [id number]  Attending Physician Name and Address:  Rexene Alberts, MD  Relative Name and Phone Number:       Current Level of Care: Hospital Recommended Level of Care: [place] Prior Approval Number:    Date Approved/Denied:   PASRR Number: [id number]  Discharge Plan: SNF    Current Diagnoses: Patient Active Problem List   Diagnosis Date Noted  . S/P mitral valve replacement with mechanical valve + CABG x1 + Maze procedure 05/28/2016  . S/P CABG x 1 05/28/2016  . S/P Maze operation for atrial fibrillation 05/28/2016  . Rheumatic mitral regurgitation   . Chronic diastolic CHF (congestive heart failure) ([place])   . Coronary artery disease involving native coronary artery without angina pectoris 03/26/2016  . Second degree Mobitz I AV block 03/25/2016  . Syncope   . Tongue deviation   . Altered mental status 03/23/2016  . Transient alteration of awareness 03/21/2016  . Constipation 03/19/2016  . Asymptomatic stenosis of right carotid artery s/p right CEA 03/10/16 03/10/2016  . Carotid stenosis 04/09/2014  . Cerebral infarction due to embolism of left middle cerebral artery ([place]) 04/09/2014  . HLD (hyperlipidemia) 04/09/2014  . Valvular vegetation 04/09/2014  . Urinary urgency 12/03/2013  . Encounter for therapeutic drug monitoring 06/04/2013  . Paroxysmal atrial fibrillation ([place]) 04/19/2013  . Cardiac device in situ 04/19/2013  . Endocarditis 04/13/2013  . History of CVA (cerebrovascular accident) 04/12/2013  . Pulmonary hypertension 04/06/2013  . S/P balloon mitral valvuloplasty 2008   . Mitral valve  stenosis, rheumatic 10/25/2012  . HTN (hypertension) 08/30/2012  . History of rheumatic fever 08/30/2012    Orientation RESPIRATION BLADDER Height & Weight     Self, Time, Situation, Place  O2 (Nasal canula 2 L) Incontinent Weight: 152 lb 11.2 oz (69.3 kg) Height:  5\' 2"  (157.5 cm)  BEHAVIORAL SYMPTOMS/MOOD NEUROLOGICAL BOWEL NUTRITION STATUS   (None)  (None) Continent Diet (Full Liquid)  AMBULATORY STATUS COMMUNICATION OF NEEDS Skin   Limited Assist Verbally Surgical wounds, Other (Comment) (Catheter entry/exit)                       Personal Care Assistance Level of Assistance  Bathing, Feeding, Dressing Bathing Assistance: Limited assistance Feeding assistance: Independent Dressing Assistance: Limited assistance     Functional Limitations Info  Sight, Hearing, Speech Sight Info: Adequate Hearing Info: Adequate Speech Info: Adequate    SPECIAL CARE FACTORS FREQUENCY  PT (By licensed PT), OT (By licensed OT), Blood pressure     PT Frequency: 5 x week OT Frequency: 5 x week            Contractures Contractures Info: Not present    Additional Factors Info  Code Status, Allergies Code Status Info: Full Allergies Info: NKDA           Current Medications (06/01/2016):  This is the current hospital active medication list Current Facility-Administered Medications  Medication Dose Route Frequency Provider Last Rate Last Dose  . 0.9 %  sodium chloride infusion  250 mL Intravenous Continuous Rexene Alberts, MD   Stopped at 05/31/16 1100  . 0.9 %  sodium chloride infusion  250 mL Intravenous PRN Rexene Alberts, MD      . acetaminophen (TYLENOL) tablet 1,000 mg  1,000 mg Oral Q6H Rexene Alberts, MD   1,000 mg at 05/31/16 1800  . amiodarone (PACERONE) tablet 200 mg  200 mg Oral Daily Rexene Alberts, MD   200 mg at 06/01/16 0941  . aspirin EC tablet 81 mg  81 mg Oral Daily Rexene Alberts, MD   81 mg at 06/01/16 7078  . atorvastatin (LIPITOR) tablet 80 mg  80 mg  Oral q1800 Rexene Alberts, MD   80 mg at 05/31/16 1800  . bisacodyl (DULCOLAX) EC tablet 10 mg  10 mg Oral Daily Rexene Alberts, MD   10 mg at 06/01/16 6754   Or  . bisacodyl (DULCOLAX) suppository 10 mg  10 mg Rectal Daily Rexene Alberts, MD      . docusate sodium (COLACE) capsule 200 mg  200 mg Oral Daily Rexene Alberts, MD   200 mg at 06/01/16 4920  . feeding supplement (BOOST / RESOURCE BREEZE) liquid 1 Container  1 Container Oral TID BM Rexene Alberts, MD   1 Container at 06/01/16 1000  . MEDLINE mouth rinse  15 mL Mouth Rinse BID Rexene Alberts, MD   15 mL at 05/31/16 2200  . metoprolol (LOPRESSOR) injection 2.5-5 mg  2.5-5 mg Intravenous Q2H PRN Rexene Alberts, MD      . ondansetron [facility]) injection 4 mg  4 mg Intravenous Q6H PRN Rexene Alberts, MD   4 mg at 05/31/16 1610  . pantoprazole (PROTONIX) EC tablet 40 mg  40 mg Oral Daily Rexene Alberts, MD   40 mg at 06/01/16 0936  . sodium chloride flush (NS) 0.9 % injection 3 mL  3 mL Intravenous Q12H Rexene Alberts, MD   3 mL at 05/30/16 1304  . sodium chloride flush (NS) 0.9 % injection 3 mL  3 mL Intravenous PRN Rexene Alberts, MD      . sodium chloride flush (NS) 0.9 % injection 3 mL  3 mL Intravenous Q12H Rexene Alberts, MD   3 mL at 05/31/16 2146  . sodium chloride flush (NS) 0.9 % injection 3 mL  3 mL Intravenous PRN Rexene Alberts, MD      . traMADol Veatrice Bourbon) tablet 50-100 mg  50-100 mg Oral Q4H PRN Rexene Alberts, MD   50 mg at 06/01/16 1219  . warfarin (COUMADIN) tablet 1 mg  1 mg Oral q1800 Rexene Alberts, MD      . Warfarin - Physician Dosing Inpatient   Does not apply [id number] Rexene Alberts, MD         Discharge Medications: Please see discharge summary for a list of discharge medications.  Relevant Imaging Results:  Relevant Lab Results:   Additional Information SS#: [id number]  Candie Chroman, LCSW

## 2016-06-01 NOTE — Progress Notes (Signed)
CARDIAC REHAB PHASE I   PRE:  Rate/Rhythm: 81 SR    BP: lying 122/61, sitting 105/54, standing 97/66    SaO2: 97 RA  MODE:  Ambulation: 108 ft   POST:  Rate/Rhythm: 97     BP: sitting 110/61     SaO2: 100 RA  Pt sound asleep upon my arrival. Difficult to arouse. She was able to follow commands, just slow and lethargic. BP with orthostasis, pt sts slight dizziness with standing. Able to walk with RW and gait belt but tired with distance. Requested to sit in hall at halfway. Requested ice chips. Then able to stand and walk back. Better motivation on return trip but still sleepy. To recliner and immediately fell asleep. BP better. Left in recliner with family present.  [place], ACSM 06/01/2016 2:28 PM

## 2016-06-01 NOTE — Clinical Social Work Note (Signed)
Clinical Social Work Assessment  Patient Details  Name: Marie Malone MRN: [id number] Date of Birth: Apr 30, 1951  Date of referral:  06/01/16               Reason for consult:  Facility Placement, Discharge Planning                Permission sought to share information with:  Facility Sport and exercise psychologist, Family Supports Permission granted to share information::  Yes, Verbal Permission Granted  Name::     Ja'net Greeson  Agency::  SNF's  Relationship::  Daughter  Contact Information:  [phone]  Housing/Transportation Living arrangements for the past 2 months:  Single Family Home Source of Information:  Patient, Medical Team, Adult Children, Spouse Patient Interpreter Needed:  None Criminal Activity/Legal Involvement Pertinent to Current Situation/Hospitalization:  No - Comment as needed Significant Relationships:  Adult Children, Spouse, Siblings Lives with:  Spouse Do you feel safe going back to the place where you live?  Yes Need for family participation in patient care:  Yes (Comment)  Care giving concerns:  PT recommending SNF once medically stable for discharge.   Social Worker assessment / plan:  CSW met with patient. Daughter and husband at bedside. Patient was asleep in the chair but woke up briefly when CSW came in. CSW introduced role and explained that PT recommendations would be discussed. Patient's daughter and husband agreeable to SNF. CSW woke up patient to ask if she was agreeable and she nodded that she was. CSW provided SNF list. Per RNCM, patient may be ready by Thursday. No further concerns. CSW encouraged patient and her family to contact CSW as needed. CSW will continue to follow patient and family for support and facilitate discharge to SNF once medically stable.  Employment status:  Retired Forensic scientist:  Other (Comment Required) Nurse, mental health) PT Recommendations:  [place] / Referral to community resources:  [place]  Patient/Family's Response to care:  Patient and her family agreeable to SNF placement. Patient's famnily supportive and involved in patient's care. Patient and her family appreciated social work intervention.  Patient/Family's Understanding of and Emotional Response to Diagnosis, Current Treatment, and Prognosis:  Patient and her family have a good understanding of the reason for her current hospitalization. Patient and her family appear happy with hospital care.  Emotional Assessment Appearance:  Appears stated age Attitude/Demeanor/Rapport:  Unable to Assess Affect (typically observed):  Accepting Orientation:  Oriented to Self, Oriented to Place, Oriented to  Time, Oriented to Situation Alcohol / Substance use:  Never Used Psych involvement (Current and /or in the community):  No (Comment)  Discharge Needs  Concerns to be addressed:  Care Coordination Readmission within the last 30 days:  No Current discharge risk:  Dependent with Mobility Barriers to Discharge:  Ship broker, Continued Medical Work up   Candie Chroman, LCSW 06/01/2016, 12:54 PM

## 2016-06-01 NOTE — Evaluation (Signed)
Physical Therapy Evaluation Patient Details Name: Marie Malone MRN: [id number] DOB: 03-28-1951 Today's Date: 06/01/2016   History of Present Illness  Pt is a 66 yo female s/p MVR and CABG. PMHx: CEA on 03/10/16, mitral stenosis, pulmonary HTN and CVA, CHF, Afib  Clinical Impression  Pt pleasant, flat affect and somewhat lethargic particularly with fatigue after ambulation. Pt moving well and educated for precautions, transfers, gait and progression with family present and handout provided. Pt with decreased activity tolerance, gait, transfers and cognition who will benefit from acute therapy to maximize mobility, function and independence. Pt has spouse who can be available 24hrs a day but has cognitive and word finding difficulty who called daughter in [place] when pt had previous unresponsive episodes rather than 911. Sisters able to provide assist intermittently but family concerned that spouse unable to provide sufficient supervision at home. Pt would benefit from ST-SNF for maximize independence. Pt noted to have slight dizziness with mobility with BP drop after gait. Would check orthostatic BP next session.   HR 76-83 BP pre 117/57 Post 102/50 sats 99% on RA    Follow Up Recommendations SNF;Supervision for mobility/OOB    Equipment Recommendations  None recommended by PT    Recommendations for Other Services       Precautions / Restrictions Precautions Precautions: Fall;Sternal Precaution Comments: watch BP Restrictions Weight Bearing Restrictions: Yes      Mobility  Bed Mobility Overal bed mobility: Needs Assistance Bed Mobility: Rolling;Sidelying to Sit Rolling: Min guard Sidelying to sit: Min assist       General bed mobility comments: cues for sequence, safety and precautions  Transfers Overall transfer level: Needs assistance   Transfers: Sit to/from Stand Sit to Stand: Min guard         General transfer comment: cues for hand placement, safety and  sequence x 2 trials  Ambulation/Gait Ambulation/Gait assistance: Min guard Ambulation Distance (Feet): 120 Feet Assistive device: Rolling walker (2 wheeled) Gait Pattern/deviations: Step-through pattern;Decreased stride length;Trunk flexed   Gait velocity interpretation: Below normal speed for age/gender General Gait Details: cues for posture and position in RW  Stairs            Wheelchair Mobility    Modified Rankin (Stroke Patients Only)       Balance Overall balance assessment: Needs assistance   Sitting balance-Leahy Scale: Good       Standing balance-Leahy Scale: Fair                               Pertinent Vitals/Pain Pain Assessment: 0-10 Pain Score: 3  Pain Location: sternum Pain Descriptors / Indicators: Sore Pain Intervention(s): Limited activity within patient's tolerance;Repositioned    Home Living Family/patient expects to be discharged to:: Private residence Living Arrangements: Spouse/significant other Available Help at Discharge: Family;Available 24 hours/day Type of Home: House Home Access: Stairs to enter   [employer] of Steps: 3 Home Layout: One level Home Equipment: Cane - single point;Walker - 2 wheels;Shower seat - built in Additional Comments: 2 steps to step down to living room    Prior Function Level of Independence: Independent         Comments: Patient drives - husband does not.  She and husband cook and clean together. Family report spouse had an aneurysm and has difficulty with problem solving and word finding at times     Hand Dominance        Extremity/Trunk Assessment  Upper Extremity Assessment Upper Extremity Assessment: Overall WFL for tasks assessed    Lower Extremity Assessment Lower Extremity Assessment: Overall WFL for tasks assessed    Cervical / Trunk Assessment Cervical / Trunk Assessment: Kyphotic  Communication   Communication: No difficulties  Cognition  Arousal/Alertness: Awake/alert Behavior During Therapy: WFL for tasks assessed/performed Overall Cognitive Status: Within Functional Limits for tasks assessed                      General Comments      Exercises     Assessment/Plan    PT Assessment Patient needs continued PT services  PT Problem List Decreased mobility;Decreased activity tolerance;Decreased cognition;Decreased balance;Decreased knowledge of use of DME;Decreased knowledge of precautions          PT Treatment Interventions Gait training;DME instruction;Therapeutic activities;Stair training;Functional mobility training;Balance training;Therapeutic exercise;Patient/family education    PT Goals (Current goals can be found in the Care Plan section)  Acute Rehab PT Goals Patient Stated Goal: return home PT Goal Formulation: With patient/family Time For Goal Achievement: 06/15/16 Potential to Achieve Goals: Good    Frequency Min 3X/week   Barriers to discharge Decreased caregiver support family report spouse if physically able to help but has cognitive deficits and concerned with him being alone with her as caregiver all day and night, sisters to provide limited assist when not working    Co-evaluation               End of Session Equipment Utilized During Treatment: Gait belt Activity Tolerance: Patient tolerated treatment well Patient left: in chair;with call bell/phone within reach;with family/visitor present Nurse Communication: Mobility status;Precautions         Time: 1610-9604 PT Time Calculation (min) (ACUTE ONLY): 32 min   Charges:   PT Evaluation $PT Eval Moderate Complexity: 1 Procedure PT Treatments $Gait Training: 8-22 mins   PT G Codes:        Johnney Scarlata B Shyam Dawson 06/06/16, 11:22 AM Elwyn Reach, [place]

## 2016-06-01 NOTE — Clinical Social Work Placement (Signed)
   CLINICAL SOCIAL WORK PLACEMENT  NOTE  Date:  06/01/2016  Patient Details  Name: Marie Malone MRN: [id number] Date of Birth: 11/18/1950  Clinical Social Work is seeking post-discharge placement for this patient at the [place] level of care (*CSW will initial, date and re-position this form in  chart as items are completed):  Yes   Patient/family provided with [facility] Work Department's list of facilities offering this level of care within the geographic area requested by the patient (or if unable, by the patient's family).  Yes   Patient/family informed of their freedom to choose among providers that offer the needed level of care, that participate in Medicare, Medicaid or managed care program needed by the patient, have an available bed and are willing to accept the patient.  Yes   Patient/family informed of [facility]'s ownership interest in [facility] and [facility], as well as of the fact that they are under no obligation to receive care at these facilities.  PASRR submitted to EDS on 06/01/16     PASRR number received on 06/01/16     Existing PASRR number confirmed on       FL2 transmitted to all facilities in geographic area requested by pt/family on 06/01/16     FL2 transmitted to all facilities within larger geographic area on       Patient informed that his/her managed care company has contracts with or will negotiate with certain facilities, including the following:            Patient/family informed of bed offers received.  Patient chooses bed at       Physician recommends and patient chooses bed at      Patient to be transferred to   on  .  Patient to be transferred to facility by       Patient family notified on   of transfer.  Name of family member notified:        PHYSICIAN Please sign FL2     Additional Comment:    _______________________________________________ Candie Chroman, LCSW 06/01/2016,  12:58 PM

## 2016-06-02 LAB — CBC
HCT: 25.1 % — ABNORMAL LOW (ref 36.0–46.0)
HEMOGLOBIN: 7.9 g/dL — AB (ref 12.0–15.0)
MCH: 27 pg (ref 26.0–34.0)
MCHC: 31.5 g/dL (ref 30.0–36.0)
MCV: 85.7 fL (ref 78.0–100.0)
Platelets: 140 10*3/uL — ABNORMAL LOW (ref 150–400)
RBC: 2.93 MIL/uL — AB (ref 3.87–5.11)
RDW: 17.6 % — ABNORMAL HIGH (ref 11.5–15.5)
WBC: 12.8 10*3/uL — AB (ref 4.0–10.5)

## 2016-06-02 LAB — CBC AND DIFFERENTIAL
HCT: 25 % — AB (ref 36–46)
Hemoglobin: 7.9 g/dL — AB (ref 12.0–16.0)
PLATELETS: 140 10*3/uL — AB (ref 150–399)
WBC: 12.8 10^3/mL

## 2016-06-02 LAB — BASIC METABOLIC PANEL
ANION GAP: 9 (ref 5–15)
BUN: 24 mg/dL — ABNORMAL HIGH (ref 6–20)
BUN: 24 mg/dL — AB (ref 4–21)
CO2: 25 mmol/L (ref 22–32)
Calcium: 9 mg/dL (ref 8.9–10.3)
Chloride: 106 mmol/L (ref 101–111)
Creatinine, Ser: 0.88 mg/dL (ref 0.44–1.00)
Creatinine: 0.9 mg/dL (ref 0.5–1.1)
GFR calc Af Amer: >60 mL/min (ref >60)
GLUCOSE: 123 mg/dL
GLUCOSE: 123 mg/dL — AB (ref 65–99)
POTASSIUM: 4.2 mmol/L (ref 3.4–5.3)
POTASSIUM: 4.2 mmol/L (ref 3.5–5.1)
SODIUM: 140 mmol/L (ref 137–147)
Sodium: 140 mmol/L (ref 135–145)

## 2016-06-02 LAB — PROTIME-INR
INR: 2.65
Prothrombin Time: 28.8 seconds — ABNORMAL HIGH (ref 11.4–15.2)
Protime: 28.8 seconds — AB (ref 10.0–13.8)

## 2016-06-02 LAB — POCT INR: INR: 2.7 — AB (ref 0.9–1.1)

## 2016-06-02 MED ORDER — LISINOPRIL 5 MG PO TABS
5.0000 mg | ORAL_TABLET | Freq: Every day | ORAL | Status: DC
  Administered 2016-06-02: 5 mg via ORAL
  Filled 2016-06-02: qty 1

## 2016-06-02 NOTE — Progress Notes (Signed)
CARDIAC REHAB PHASE I   PRE:  Rate/Rhythm: 83 SR 1Hb  BP:  Supine: 115/65  Sitting: 118/68  Standing: 107/88   SaO2: 98%RA  MODE:  Ambulation: 80 ft   POST:  Rate/Rhythm: 108 ST  BP:  Supine: 101/82  Sitting:   Standing:    SaO2: 97%RA 1304-1330 Pt sleeping. Daughter stated she walked about an hour ago but should be able to walk again. Pt in agreement. Pt walked 80 ft on RA with gait belt use, rolling walker and asst x 1. Generalized weakness. Back to bed after walk. Orthostatics as above. C/o feeling a little lightheaded.    Graylon Good, RN BSN  06/02/2016 1:24 PM

## 2016-06-02 NOTE — Discharge Instructions (Signed)
Mitral Valve Replacement, Care After This sheet gives you information about how to care for yourself after your procedure. Your health care provider may also give you more specific instructions. If you have problems or questions, contact your health care provider. What can I expect after the procedure? After the procedure, it is common to have:  Pain at the incision area that may last for several weeks. Follow these instructions at home: Incision care  Follow instructions from your health care provider about how to take care of your incision. Make sure you:  Wash your hands with soap and water before you change your bandage (dressing). If soap and water are not available, use hand sanitizer.  Change your dressing as told by your health care provider.  Leave stitches (sutures), skin glue, or adhesive strips in place. These skin closures may need to stay in place for 2 weeks or longer. If adhesive strip edges start to loosen and curl up, you may trim the loose edges. Do not remove adhesive strips completely unless your health care provider tells you to do that.  Check your incision area every day for signs of infection. Check for:  More redness, swelling, or pain.  More fluid or blood.  Warmth.  Pus or a bad smell.  Do not apply powder or lotion to the area. Driving  Do not drive until your health care provider approves.  Do not drive or use heavy machinery while taking prescription pain medicines. Bathing  Do not take baths, swim, or use a hot tub for 2-4 weeks after surgery, or until your health care provider approves. Ask your health care provider if you may take showers.  To wash the incision site, gently wash with soap and water and pat the area dry with a clean towel. Do not rub the incision area. That may cause bleeding. Activity  Rest as told by your health care provider. Ask your health care provider when you can resume normal activities, including sexual  activity.  Avoid the following activities for 6-8 weeks, or as long as directed:  Lifting anything that is heavier than 10 lb (4.5 kg), or the limit that your health care provider tells you.  Pushing or pulling things with your arms.  Avoid climbing stairs and using the handrail to pull yourself up for the first 2-3 weeks after surgery.  Avoid airplane travel for 4-6 weeks, or as long as directed.  Avoid sitting for long periods of time and crossing your legs. Get up and move around at least once every 1-2 hours.  If you are taking blood thinners (anticoagulants), avoid activities that have a high risk of injury. Ask your health care provider what activities are safe for you. Lifestyle  Limit alcohol intake to no more than 1 drink a day for nonpregnant women and 2 drinks a day for men. One drink equals 12 oz of beer, 5 oz of wine, or 1 oz of hard liquor.  Do not use any products that contain nicotine or tobacco, such as cigarettes and e-cigarettes. If you need help quitting, ask your health care provider. General instructions  Take your temperature every day and weigh yourself every morning for the first 7 days after surgery. Write your temperatures and weight down and take this record with you to any follow-up visits.  Take over-the-counter and prescription medicines only as told by your health care provider.  To prevent or treat constipation while you are taking prescription pain medicine, your health care provider may  recommend that you:  Drink enough fluid to keep your urine clear or pale yellow.  Take over-the-counter or prescription medicines.  Eat foods that are high in fiber, such as fresh fruits and vegetables, whole grains, and beans.  Limit foods that are high in fat and processed sugars, such as fried and sweet foods.  Follow instructions from your health care provider about eating or drinking restrictions.  Wear compression stockings for at least 2 weeks, or as  long as told by your health care provider. These stockings help to prevent blood clots and reduce swelling in your legs. If your ankles are swollen after 2 weeks, continue to wear the stockings.  Keep all follow-up visits as told by your health care provider. This is important. Contact a health care provider if:  You develop a skin rash.  Your weight is increasing each day over 2-3 days.  You gain 2 lb (1 kg) or more in a single day.  You have a fever. Get help right away if:  You develop chest pain that feels different from the pain caused by your incision.  You develop shortness of breath or difficulty breathing.  You have more redness, swelling, or pain around your incision.  You have more fluid or blood coming from your incision.  Your incision feels warm to the touch.  You have pus or a bad smell coming from your incision.  You feel light-headed. This information is not intended to replace advice given to you by your health care provider. Make sure you discuss any questions you have with your health care provider. Document Released: 11/13/2004 Document Revised: 02/06/2016 Document Reviewed: 02/06/2016 Elsevier Interactive Patient Education  2017 [employer].

## 2016-06-02 NOTE — Progress Notes (Signed)
Pacing wires removed per order. Patient instructed on bed rest and q15 VS x 1hr. 

## 2016-06-02 NOTE — Discharge Summary (Signed)
Physician Discharge Summary  Patient ID: CAMRI MOLLOY MRN: [id number] DOB/AGE: [birth year] 66 y.o.  Admit date: 05/28/2016 Discharge date: 06/04/2016  Admission Diagnoses:Severe mitral stenosis, type III A-rheumatic                             Severe single-vessel coronary artery disease                                         Chronic atrial fibrillation Discharge Diagnoses:  Principal Problem:   S/P mitral valve replacement with mechanical valve + CABG x1 + Maze procedure Active Problems:   HTN (hypertension)   Mitral valve stenosis, rheumatic   S/P balloon mitral valvuloplasty 2008   Pulmonary hypertension   Paroxysmal atrial fibrillation (HCC)   HLD (hyperlipidemia)   Second degree Mobitz I AV block   Chronic diastolic CHF (congestive heart failure) (HCC)   Coronary artery disease involving native coronary artery without angina pectoris   Rheumatic mitral regurgitation   S/P CABG x 1   S/P Maze operation for atrial fibrillation  Patient Active Problem List   Diagnosis Date Noted  . S/P mitral valve replacement with mechanical valve + CABG x1 + Maze procedure 05/28/2016  . S/P CABG x 1 05/28/2016  . S/P Maze operation for atrial fibrillation 05/28/2016  . Rheumatic mitral regurgitation   . Chronic diastolic CHF (congestive heart failure) ([place])   . Coronary artery disease involving native coronary artery without angina pectoris 03/26/2016  . Second degree Mobitz I AV block 03/25/2016  . Syncope   . Tongue deviation   . Altered mental status 03/23/2016  . Transient alteration of awareness 03/21/2016  . Constipation 03/19/2016  . Asymptomatic stenosis of right carotid artery s/p right CEA 03/10/16 03/10/2016  . Carotid stenosis 04/09/2014  . Cerebral infarction due to embolism of left middle cerebral artery ([place]) 04/09/2014  . HLD (hyperlipidemia) 04/09/2014  . Valvular vegetation 04/09/2014  . Urinary urgency 12/03/2013  . Encounter for therapeutic drug monitoring  06/04/2013  . Paroxysmal atrial fibrillation ([place]) 04/19/2013  . Cardiac device in situ 04/19/2013  . Endocarditis 04/13/2013  . History of CVA (cerebrovascular accident) 04/12/2013  . Pulmonary hypertension 04/06/2013  . S/P balloon mitral valvuloplasty 2008   . Mitral valve stenosis, rheumatic 10/25/2012  . HTN (hypertension) 08/30/2012  . History of rheumatic fever 08/30/2012   HPI:  Patient is a 66 year old African-American female with rheumatic heart disease including mitral stenosis status post balloon mitral valvuloplasty in 6073, chronic diastolic congestive heart failure, recurrent paroxysmal atrial fibrillation, 2 previous embolic strokes who has been referred for surgical consultation to discuss treatment options for management of severe mitral stenosis. The patient denies any known history of rheumatic fever during childhood but she states that she had recurrent streptococcal infections on many occasions during her teenage years. She first presented with symptoms of shortness of breath in 2008 and was diagnosed with severe mitral stenosis. She was initially evaluated by a cardiologist in [facility]. Cardiac catheterization performedin June of 2008 showed normal LV function, no coronary disease, moderate mitral stenosis with a valve area of 1.1 cm, and severe pulmonary hypertension. TEE in [facility] in 2008 showed normal LV function and moderate mitral stenosis with a valve area of 1.4 cm, mild MR. [facility] was[facility] where she underwent balloon mitral valvuloplasty. She  did well for several years but she suffered an embolic stroke in 6553. She was reportedly anticoagulated using warfarin for a period of time but this was later stopped. In 2014 the patient was referred to [name] for evaluation and she has been followed closely ever since. In November 2014 she suffered a second embolic stroke which involved a fairly large area in the left MCA  distribution. Symptoms of right-sided weakness improved very quickly. TEE performed at that time revealed mild mitral stenosis and mild mitral regurgitation. There was spontaneous contrast in the dilated left atrium and left atrial appendage, but no thrombus. There was a small oscillating density on the anterior mitral valve leaflet of uncertain etiology, and vegetation could not be excluded. Blood cultures were negative, but the patient had been receiving oral doxycycline. There were no other signs of endocarditis, although sed rate was elevated. The patient was started on Plavix because of her stroke. An implantable loop recorder was placed and she was treated with a 6 week course of antibiotics. Implantable loop recorder confirmed the presence of recurrent paroxysmal atrial fibrillation, and the patient was started on warfarin.  The patient did well for approximately 2 years and has been followed intermittently by [name] and [name]. Follow-up transthoracic echocardiogram performed in August of this year revealed normal left ventricular systolic function with moderate mitral stenosis (mean transvalvular gradient estimated 8 mmHg) moderate mitral regurgitation, and severe left atrial enlargement. At that time the patient began to complain of occasional substernal chest tightness and worsening dyspnea with exertion. Follow-up carotid duplex scan performed at that time revealed 60-79% stenosis of the right internal carotid artery and CTA was recommended. CTA revealed radiographic string sign consistent with tight right internal carotid artery stenosis. The patient subsequently underwent elective right carotid endarterectomy with patch angioplasty by [name] on 03/10/2016. She initially did well but she was readmitted to the hospital on 03/19/2016 with altered mental status associated with tachycardia and hypotension. She was observed overnight in the hospital where she was given gentle IV  hydration. It was felt that her symptoms were likely related to hypotension and orthostasis. She was discharged home the following day but was again readmitted less than 24 hours later following a frank syncopal episode. In the emergency department she was still noted to be orthostatic with drop in blood pressure from 152/61 to 118/62. She was given IV hydration. MRI of the brain revealed no evidence for acute stroke. Neurology was consulted and EEG revealed normal awake and sleep findings with no seizure-like activity. EKG revealed sinus rhythm with occasional brief runs of nonsustained VT. follow-up echocardiogram revealed normal left ventricular systolic function with severe mitral stenosis and mild mitral regurgitation. Peak and mean transvalvular gradients across the mitral valve were estimated 12 and 9 mmHg, respectively. Indexed valve area by pressure half-time was reported 0.82 cm/m. The patient subsequently underwent transesophageal echocardiogram 03/24/2016. This confirmed the presence of rheumatic mitral valve disease with severe mitral stenosis. Mean transvalvular gradient across mitral valve was estimated 11 mmHg with valve area by pressure half-time estimated 1.25 cm but valve area using the continuity equation only 0.77 cm. The left atrium was dilated. There was no thrombus in the left atrial appendage. The patient underwent left and right heart catheterization 03/25/2016. This confirmed the presence of normal left ventricular systolic function. Mean transvalvular gradient across the mitral valve was entered 10 mmHg corresponding to valve area calculated 1.3 cm, indexed valve area 0.76 cm/m. The patient had severe single-vessel  coronary artery disease with 100% chronic occlusion of the right coronary artery and left to right collaterals. There was moderate pulmonary hypertension with PA pressures measured 66/21, mean pulmonary capillary wedge pressure 13 mmHg and mean CVP reported 1  mmHg . Cardiothoracic surgical consultation was requested.  The patient is married and lives locally in [place] with her husband. Her daughter lives in [place] but is at the bedside with the patient's husband today for consultation. 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 She reports that her only significant physical limitation is that of exertional shortness of breath. Symptoms have been progressing for the last several months and she now gets short of breath with relatively mild activity. She denies any history of resting shortness of breath, PND, orthopnea, or lower extremity edema. She reports occasional palpitations. Prior to carotid endarterectomy she has not previously experienced any episodes of dizzy spells or syncope. At this point she feels as though she has not yet completely recovered from her recent surgery. Her appetite is been slowly improving. She knows that she has poor dentition and she has not seen a dentist in many years.  Patient was originally seen in consultation on 03/26/2016 after she had been readmitted to the hospital following a syncopal event that was attributed to orthostatic symptoms. She underwent follow-up transthoracic and transesophageal echocardiograms at that time that revealed the presence of classical rheumatic mitral valve disease with severe mitral stenosis and mild mitral regurgitation. There remained normal left ventricular systolic function and moderate to severe pulmonary hypertension with trivial tricuspid regurgitation. Diagnostic cardiac catheterization was performed and notable for the presence of multivessel coronary artery disease with chronic 100% occlusion of the right coronary artery and otherwise mild nonobstructive disease in the left coronary territory. The patient was subsequently seen in consultation by Dr.  Enrique Sack and underwent dentalextraction. She was discharged from the hospital and returns to our office for follow-up today. Since hospital discharge she has been seen in follow-up by Dr. Enrique Sack and [name]. She returns to the office today for routine scheduled follow-up appointment to discuss possible plans for elective mitral valve replacement, coronary artery bypass grafting, and Maze procedure. She reports that since hospital discharge she has made steady improvement. She reports that she feels quite well with ordinary activities and she denies any symptoms of shortness of breath. She only gets short of breath and tired with more strenuous physical exertion. She has purposefully avoided strenuous activity. She has not had any further dizzy spells or palpitations. Appetite and strength improved. The soreness in her mouth has almost completely resolved. The remainder of her review of systems is unremarkable.  Patient was last seen here in our office on 04/26/2016 at which time we made tentative plans for elective surgery next week. She returns to the office today and reports that overall she remains clinically stable. She still gets short of breath with mild activity. She gets more short of breath when she lays flat in bed, and frequently she is short of breath when she wakes up in the morning and has to sit up. She has not had any exertional chest pain or chest tightness. She has not had any more dizzy spells or near syncopal events. She denies any fevers, chills, or productive cough. She feels as though she has recovered from her carotid endarterectomy uneventfully. She is looking  for to proceeding with surgery in the near future.    Discharged Condition: good  Hospital Course: The patient was admitted electively and taken to the operating room on 05/28/2016 where she underwent the below described procedure. She tolerated well was taken to the surgical intensive care unit in stable  condition.  Post operative Hospital course:  Patient has overall progressed nicely. She initially did require low-dose milrinone and AAI pacer support. He was weaned from the ventilator using standard postoperative protocols without difficulty. She initially did have some over sedation but that resolved using standard measures. She has not had any neurological changes. She does have an expected acute blood loss anemia and values have stabilized. She had a postoperative thrombocytopenia and values have normalized. Most recent H/H is 7.9/20 5.1. Most recent platelet count is 140K. She does have some mild postoperative volume overload but this has responded well to diuretics. Renal function has remained within normal limits. Most recent BUN/creatinine is 24/0.88. Blood sugars have been under good control using standard postoperative protocols. She has been started on Coumadin for her mechanical valve. INR is being checked daily and Coumadin is being adjusted. She does have a fair amount of postoperative deconditioning and it is felt she would benefit from a short-term stay in a skilled nursing facility at time of discharge.  Consults: None  Significant Diagnostic Studies: routine post-op serial labs/CXR's  Treatments: surgery:  CARDIOTHORACIC SURGERY OPERATIVE NOTE  Date of Procedure:                05/28/2016  Preoperative Diagnosis:        Rheumatic Mitral Valve Disease with Severe Mitral Stenosis and Moderate Mitral Regurgitation  Recurrent Paroxysmal Atrial Fibrillation  Postoperative Diagnosis:    Same  Procedure:       Mitral Valve Replacement              Sorin [employer] Optiform bileaflet mechanical valve (size 14mm, model #F7-029, serial #[id number])   Coronary Artery Bypass Grafting x 1              Saphenous Vein Graft to Posterior Descending Coronary Artery             Endoscopic Vein Harvest from Right Thigh   Maze Procedure              complete bilateral atrial  lesion set using bipolar radiofrequency and cryothermy ablation             clipping of left atrial appendage (Atriclip size 67mm)  Surgeon:        Valentina Gu. Roxy Manns, MD  Assistant:       John Giovanni, PA-C and Brigid Re, PA-S  Anesthesia:    Suella Broad, MD  Operative Findings:  Rheumatic mitral valve disease with severe mitral stenosis and moderate mitral regurgitation  Type IIIA mitral valve dysfunction  Normal LV systolic function  Moderate-severe pulmonary hypertension Discharge Exam: Blood pressure (!) 145/79, pulse 87, temperature 98.1 F (36.7 C), temperature source Tympanic, resp. rate 16, height 5\' 2"  (1.575 m), weight 157 lb 14.4 oz (71.6 kg), SpO2 97 %.   General appearance: alert, cooperative and no distress Heart: regular rate and rhythm Lungs: mildly dim in bases Abdomen: benign Extremities: no edema Wound: incis healing well  Disposition: Skilled nursing facility Discharge Instructions    Amb Referral to Cardiac Rehabilitation    Complete by:  As directed    Diagnosis:   CABG Valve Replacement     Valve:  Mitral   CABG X ___:  1     Allergies as of 06/04/2016      Reactions   No Known Allergies       Medication List    STOP taking these medications   enoxaparin 120 MG/0.8ML injection Commonly known as:  LOVENOX   metoprolol succinate 25 MG 24 hr tablet Commonly known as:  TOPROL-XL   TYLENOL 325 MG tablet Generic drug:  acetaminophen     TAKE these medications   amiodarone 200 MG tablet Commonly known as:  PACERONE Take 1 tablet (200 mg total) by mouth 2 (two) times daily.   aspirin 81 MG EC tablet Take 1 tablet (81 mg total) by mouth daily. Start taking on:  06/05/2016   atorvastatin 80 MG tablet Commonly known as:  LIPITOR TAKE ONE TABLET BY MOUTH ONCE DAILY AT 6 PM   chlorhexidine 0.12 % solution Commonly known as:  PERIDEX Rinse with 15 mLs bid after breakfast and at bedtime.  Use in a swish and spit manner.    docusate sodium 100 MG capsule Commonly known as:  COLACE Take 1 capsule (100 mg total) by mouth 2 (two) times daily. What changed:  when to take this  reasons to take this   lisinopril 10 MG tablet Commonly known as:  PRINIVIL,ZESTRIL Take 1 tablet (10 mg total) by mouth daily. Start taking on:  06/05/2016   polyethylene glycol packet Commonly known as:  MIRALAX / GLYCOLAX Take 17 g by mouth daily.   traMADol 50 MG tablet Commonly known as:  ULTRAM Take 1-2 tablets (50-100 mg total) by mouth every 4 (four) hours as needed for moderate pain.   warfarin 2.5 MG tablet Commonly known as:  COUMADIN Take 1 tablet (2.5 mg total) by mouth daily at 6 PM. What changed:  when to take this  additional instructions       Contact information for follow-up providers    [name], MD Follow up.   Specialty:  Cardiology Why:  Please see written discharge instruction for follow-up appointment schedule. Contact information: [address] [place] [address] [phone]        [name], MD .   Specialty:  Cardiology Contact information: [place] [address] [phone]        Rexene Alberts, MD Follow up.   Specialty:  Cardiothoracic Surgery Why:  Please see discharge paperwork for appointments. At time of appointment to see Dr. Roxy Manns please obtain a PA and lateral chest x-ray at [facility] which is located in the same office complex one half hour prior to that appointment. Contact information: [address] [place] [address] [phone]            Contact information for after-discharge care    Destination    [facility] Follow up.   Specialty:  [place] information: [address] [place] [phone]                 Atrial fibrillation clinic: see discharge paperwork for appointment time and  date(AVS)     1. Please obtain vital signs at least one time daily 2.Please weigh the patient daily. If he or she continues to gain weight or develops lower extremity edema, contact the office at [phone]. 3. Ambulate patient at least three times daily and please use sternal precautions. 4 obtain a PT/INR to adjust  Coumadin dosing in the next 2-3 days.  The patient has been discharged on:   1.Beta Blocker:  Yes [   ]                              No   [ n  ]                              If No, reason:on hold: initial heart block and pacer use(Temp) not currently a candidate  2.Ace Inhibitor/ARB: Yes [  y ]                                     No  [    ]                                     If No, reason:  3.Statin:   Yes Blue.Reese  ]                  No  [   ]                  If No, reason:  4.Ecasa:  Yes  [  y ]                  No   [   ]                  If No, reason:  Signed: GOLD,WAYNE E 06/04/2016, 1:03 PM

## 2016-06-02 NOTE — Progress Notes (Addendum)
[id number]       [place] [address]             [phone]      5 Days Post-Op Procedure(s) (LRB): MITRAL VALVE (MV) REPLACEMENT USING 29 MM CARBOMEDICS OPTIFORM MECHANICAL BILEAFLET PROSTHETIC HEART VALVE (N/A) CORONARY ARTERY BYPASS GRAFTING (CABG) x 1 WITH ENDOSCOPIC HARVESTING OF RIGHT SAPHENOUS VEIN, EVH- PDA (N/A) MAZE PROCEDURE AND APPLICATION OF  ATRICLIP LAA PROCLIP II 45 MM (N/A) TRANSESOPHAGEAL ECHOCARDIOGRAM (TEE) (N/A) Subjective: Feels pretty well  Objective: Vital signs in last 24 hours: Temp:  [98 F (36.7 C)-98.6 F (37 C)] 98.6 F (37 C) (01/24 0545) Pulse Rate:  [78-100] 95 (01/24 0545) Cardiac Rhythm: Normal sinus rhythm;Bundle branch block;Heart block (01/23 1900) Resp:  [14-18] 16 (01/24 0545) BP: (102-126)/(50-72) 120/67 (01/24 0545) SpO2:  [94 %-100 %] 94 % (01/24 0545) Weight:  [158 lb 3.2 oz (71.8 kg)] 158 lb 3.2 oz (71.8 kg) (01/24 0545)  Hemodynamic parameters for last 24 hours:    Intake/Output from previous day: 01/23 0701 - 01/24 0700 In: 550 [P.O.:550] Out: -  Intake/Output this shift: No intake/output data recorded.  General appearance: alert, cooperative and no distress Heart: regular rate and rhythm and crisp valve click Lungs: dim in bases Abdomen: benign Extremities: no edema Wound: incis healing well  Lab Results:  Recent Labs  06/01/16 0315 06/02/16 0315  WBC 14.0* 12.8*  HGB 7.8* 7.9*  HCT 24.8* 25.1*  PLT 120* 140*   BMET:  Recent Labs  06/01/16 0315 06/02/16 0315  NA 141 140  K 3.4* 4.2  CL 106 106  CO2 25 25  GLUCOSE 108* 123*  BUN 22* 24*  CREATININE 0.94 0.88  CALCIUM 8.9 9.0    PT/INR:  Recent Labs  06/02/16 0315  LABPROT 28.8*  INR 2.65   ABG    Component Value Date/Time   PHART 7.351 05/30/2016 0335   HCO3 24.4 05/30/2016 0335   TCO2 26 05/30/2016 0335   ACIDBASEDEF 1.0 05/30/2016 0335   O2SAT 66.8 05/31/2016 0410   CBG (last 3)   Recent Labs   05/30/16 2038 05/31/16 0002 05/31/16 0347  GLUCAP 112* 109* 96    Meds Scheduled Meds: . acetaminophen  1,000 mg Oral Q6H  . amiodarone  200 mg Oral Daily  . aspirin EC  81 mg Oral Daily  . atorvastatin  80 mg Oral q1800  . bisacodyl  10 mg Oral Daily   Or  . bisacodyl  10 mg Rectal Daily  . docusate sodium  200 mg Oral Daily  . feeding supplement  1 Container Oral TID BM  . mouth rinse  15 mL Mouth Rinse BID  . pantoprazole  40 mg Oral Daily  . sodium chloride flush  3 mL Intravenous Q12H  . sodium chloride flush  3 mL Intravenous Q12H  . Warfarin - Physician Dosing Inpatient   Does not apply q1800   Continuous Infusions: . sodium chloride Stopped (05/31/16 1100)   PRN Meds:.sodium chloride, metoprolol, ondansetron (ZOFRAN) IV, sodium chloride flush, sodium chloride flush, traMADol  Xrays No results found.  Assessment/Plan: S/P Procedure(s) (LRB): MITRAL VALVE (MV) REPLACEMENT USING 29 MM CARBOMEDICS OPTIFORM MECHANICAL BILEAFLET PROSTHETIC HEART VALVE (N/A) CORONARY ARTERY BYPASS GRAFTING (CABG) x 1 WITH ENDOSCOPIC HARVESTING OF RIGHT SAPHENOUS VEIN, EVH- PDA (N/A) MAZE PROCEDURE AND APPLICATION OF  ATRICLIP LAA PROCLIP II 45 MM (N/A) TRANSESOPHAGEAL ECHOCARDIOGRAM (TEE) (N/A)  1 doing well 2 d/c epw's today 3 labs  stable 4 SW working on placement 5 rhythm is stable  6 HTN improved on low dose ACE-I 7 pulm toilet and routine rehab 8 INR stable, coumadin being held, may be able to add low dose soon  LOS: 5 days    GOLD,WAYNE E 06/02/2016   I have seen and examined the patient and agree with the assessment and plan as outlined.  Received Coumadin 1 mg yesterday and INR didn't drop any further.  Will hold Coumadin today and re-dose based on tomorrow's INR.  Overall doing well and possibly ready for D/C to SNF soon  Rexene Alberts, MD 06/02/2016 8:39 AM

## 2016-06-02 NOTE — Evaluation (Signed)
Occupational Therapy Evaluation Patient Details Name: Marie Malone MRN: [id number] DOB: 13-Jul-1950 Today's Date: 06/02/2016    History of Present Illness Pt is a 66 yo female s/p MVR and CABG. PMHx: CEA on 03/10/16, mitral stenosis, pulmonary HTN and CVA, CHF, Afib   Clinical Impression   Pt reports she was independent with ADL PTA. Currently pt min assist for functional mobility and mod assist overall for ADL. Pt requires cues throughout functional activities to maintain sternal precautions. Pt planning d/c to SNF; agree with SNF placement for short term rehab prior to return home to maximize independence and safety with ADL and functional mobility. Pt would benefit from continued skilled OT to address established goals.    Follow Up Recommendations  SNF;Supervision/Assistance - 24 hour    Equipment Recommendations  Other (comment) (TBD at next venue)    Recommendations for Other Services       Precautions / Restrictions Precautions Precautions: Fall;Sternal Restrictions Weight Bearing Restrictions: Yes (sternal precaution)      Mobility Bed Mobility Overal bed mobility: Needs Assistance Bed Mobility: Rolling;Sit to Sidelying Rolling: Supervision       Sit to sidelying: Min assist General bed mobility comments: Assist for LEs back to bed. Cues for technique  Transfers Overall transfer level: Needs assistance Equipment used: Rolling walker (2 wheeled) Transfers: Sit to/from Stand Sit to Stand: Min guard         General transfer comment: Cues for hand placement. Increased time required.    Balance Overall balance assessment: Needs assistance Sitting-balance support: Feet supported;No upper extremity supported Sitting balance-Leahy Scale: Good     Standing balance support: Bilateral upper extremity supported Standing balance-Leahy Scale: Poor Standing balance comment: RW for support                            ADL Overall ADL's : Needs  assistance/impaired Eating/Feeding: Set up;Sitting   Grooming: Set up;Supervision/safety;Sitting   Upper Body Bathing: Moderate assistance;Sitting   Lower Body Bathing: Moderate assistance;Sit to/from stand   Upper Body Dressing : Minimal assistance;Sitting   Lower Body Dressing: Moderate assistance;Sit to/from stand   Toilet Transfer: Minimal assistance;Ambulation;RW Toilet Transfer Details (indicate cue type and reason): Simulated by sit to stand from EOB with functional mobility in room         Functional mobility during ADLs: Minimal assistance;Rolling walker General ADL Comments: Educated pt on sternal precautions; cues to maintain precautions during functional activities.     Vision     Perception     Praxis      Pertinent Vitals/Pain Pain Assessment: Faces Faces Pain Scale: Hurts little more Pain Location: chest Pain Descriptors / Indicators: Sore Pain Intervention(s): Monitored during session;Repositioned;Premedicated before session     Hand Dominance     Extremity/Trunk Assessment Upper Extremity Assessment Upper Extremity Assessment: Overall WFL for tasks assessed   Lower Extremity Assessment Lower Extremity Assessment: Defer to PT evaluation   Cervical / Trunk Assessment Cervical / Trunk Assessment: Kyphotic   Communication Communication Communication: No difficulties   Cognition Arousal/Alertness: Awake/alert Behavior During Therapy: WFL for tasks assessed/performed Overall Cognitive Status: Within Functional Limits for tasks assessed                     General Comments       Exercises       Shoulder Instructions      Home Living Family/patient expects to be discharged to:: Skilled nursing facility Living  Arrangements: Spouse/significant other Available Help at Discharge: Family;Available 24 hours/day Type of Home: House Home Access: Stairs to enter [employer] of Steps: 3   Home Layout: Other (Comment) (split  level, 2 steps down to living room)     Bathroom Shower/Tub: Walk-in shower;Door   [employer] Toilet: Standard     Home Equipment: Kasandra Knudsen - single point;Walker - 2 wheels;Shower seat - built in          Prior Functioning/Environment Level of Independence: Independent        Comments: Patient drives - husband does not.  She and husband cook and clean together. Family report spouse had an aneurysm and has difficulty with problem solving and word finding at times        OT Problem List: Decreased strength;Impaired balance (sitting and/or standing);Decreased activity tolerance;Decreased knowledge of use of DME or AE;Decreased knowledge of precautions;Pain   OT Treatment/Interventions: Self-care/ADL training;DME and/or AE instruction;Therapeutic activities;Patient/family education;Balance training;Energy conservation;Therapeutic exercise    OT Goals(Current goals can be found in the care plan section) Acute Rehab OT Goals Patient Stated Goal: rehab then home OT Goal Formulation: With patient/family Time For Goal Achievement: 06/16/16 Potential to Achieve Goals: Good ADL Goals Pt Will Perform Grooming: with supervision;standing Pt Will Perform Lower Body Bathing: with supervision;with adaptive equipment;sit to/from stand Pt Will Perform Lower Body Dressing: with supervision;with adaptive equipment;sit to/from stand Pt Will Transfer to Toilet: with supervision;ambulating;regular height toilet Pt Will Perform Toileting - Clothing Manipulation and hygiene: with supervision;sit to/from stand  OT Frequency: Min 2X/week   Barriers to D/C:            Co-evaluation              End of Session Equipment Utilized During Treatment: Gait belt;Rolling walker Nurse Communication: Mobility status  Activity Tolerance: Patient tolerated treatment well Patient left: in bed;with call bell/phone within reach;with family/visitor present   Time: 1210-1227 OT Time Calculation (min): 17  min Charges:  OT General Charges $OT Visit: 1 Procedure OT Evaluation $OT Eval Moderate Complexity: 1 Procedure G-Codes:     Binnie Kand M.S., OTR/L Pager: [phone]  06/02/2016, 3:49 PM

## 2016-06-03 LAB — BASIC METABOLIC PANEL
Anion gap: 6 (ref 5–15)
BUN: 21 mg/dL — AB (ref 6–20)
BUN: 21 mg/dL (ref 4–21)
CALCIUM: 8.8 mg/dL — AB (ref 8.9–10.3)
CO2: 26 mmol/L (ref 22–32)
CREATININE: 0.95 mg/dL (ref 0.44–1.00)
CREATININE: 1 mg/dL (ref 0.5–1.1)
Chloride: 106 mmol/L (ref 101–111)
GFR calc Af Amer: >60 mL/min (ref >60)
GFR calc non Af Amer: >60 mL/min (ref >60)
GLUCOSE: 200 mg/dL
Glucose, Bld: 200 mg/dL — ABNORMAL HIGH (ref 65–99)
POTASSIUM: 3.8 mmol/L (ref 3.4–5.3)
Potassium: 3.8 mmol/L (ref 3.5–5.1)
SODIUM: 138 mmol/L (ref 135–145)
SODIUM: 138 mmol/L (ref 137–147)

## 2016-06-03 LAB — PROTIME-INR
INR: 2.18
PROTHROMBIN TIME: 24.6 s — AB (ref 11.4–15.2)
Protime: 24.6 seconds — AB (ref 10.0–13.8)

## 2016-06-03 LAB — PREPARE RBC (CROSSMATCH)

## 2016-06-03 LAB — POCT INR: INR: 2.2 — AB (ref 0.9–1.1)

## 2016-06-03 MED ORDER — WARFARIN SODIUM 1 MG PO TABS: 1.0000 mg | ORAL_TABLET | Freq: Once | ORAL | Status: DC

## 2016-06-03 MED ORDER — WARFARIN SODIUM 1 MG PO TABS
1.0000 mg | ORAL_TABLET | Freq: Every day | ORAL | Status: DC
  Administered 2016-06-03: 1 mg via ORAL
  Filled 2016-06-03: qty 1

## 2016-06-03 MED ORDER — SODIUM CHLORIDE 0.9 % IV SOLN
Freq: Once | INTRAVENOUS | Status: AC
  Administered 2016-06-03: 12:00:00 via INTRAVENOUS

## 2016-06-03 MED ORDER — FUROSEMIDE 10 MG/ML IJ SOLN: 20.0000 mg | Freq: Once | INTRAMUSCULAR | Status: AC

## 2016-06-03 MED ORDER — FUROSEMIDE 10 MG/ML IJ SOLN
20.0000 mg | Freq: Once | INTRAMUSCULAR | Status: AC
  Administered 2016-06-03: 20 mg via INTRAVENOUS
  Filled 2016-06-03: qty 2

## 2016-06-03 NOTE — Progress Notes (Addendum)
Physical Therapy Treatment Patient Details Name: PEIGHTON MEHRA MRN: [id number] DOB: January 09, 1951 Today's Date: 06/03/2016    History of Present Illness Pt is a 66 yo female s/p MVR and CABG. PMHx: CEA on 03/10/16, mitral stenosis, pulmonary HTN and CVA, CHF, Afib    PT Comments    Pt pleasant, awake and alert today with drifting to sleep end of session in chair during HEP, daughter present and reports drifting off as baseline for pt. On arrival pt able to recall 1/5 precautions, 4/5 end of session. Pt educated for all restrictions, functional mobility and gait. Pt with tendency to drift right with gait today, hitting objects x 3 despite cues. Will continue to follow  Supine 119/64 (78) Sitting 89/77 (83) Standing 106/69 (80) After walk 88/78 (83)  HR 88-102   Follow Up Recommendations  SNF;Supervision for mobility/OOB     Equipment Recommendations       Recommendations for Other Services       Precautions / Restrictions Precautions Precautions: Fall;Sternal Precaution Comments: watch BP    Mobility  Bed Mobility Overal bed mobility: Needs Assistance Bed Mobility: Rolling;Sit to Sidelying Rolling: Supervision Sidelying to sit: Min assist       General bed mobility comments: cues for sequence with assist to elevate trunk  Transfers Overall transfer level: Needs assistance   Transfers: Sit to/from Stand Sit to Stand: Min guard         General transfer comment: Cues for hand placement x 2 trials  Ambulation/Gait Ambulation/Gait assistance: Min guard Ambulation Distance (Feet): 220 Feet Assistive device: Rolling walker (2 wheeled) Gait Pattern/deviations: Step-through pattern;Decreased stride length;Trunk flexed   Gait velocity interpretation: Below normal speed for age/gender General Gait Details: cues for posture and position in RW   Stairs            Wheelchair Mobility    Modified Rankin (Stroke Patients Only)       Balance Overall  balance assessment: Needs assistance   Sitting balance-Leahy Scale: Good     Standing balance support: Bilateral upper extremity supported Standing balance-Leahy Scale: Fair                      Cognition Arousal/Alertness: Awake/alert Behavior During Therapy: WFL for tasks assessed/performed Overall Cognitive Status: Impaired/Different from baseline Area of Impairment: Memory     Memory: Decreased recall of precautions              Exercises General Exercises - Lower Extremity Long Arc Quad: AROM;Both;Seated;15 reps Hip Flexion/Marching: AROM;Both;Seated;15 reps    General Comments        Pertinent Vitals/Pain Pain Score: 2  Pain Location: abdomen Pain Descriptors / Indicators: Sore Pain Intervention(s): Limited activity within patient's tolerance;Repositioned    Home Living                      Prior Function            PT Goals (current goals can now be found in the care plan section) Progress towards PT goals: Progressing toward goals    Frequency           PT Plan Current plan remains appropriate    Co-evaluation             End of Session Equipment Utilized During Treatment: Gait belt Activity Tolerance: Patient tolerated treatment well Patient left: in chair;with call bell/phone within reach;with family/visitor present     Time: 0728-0759 PT Time Calculation (min) (ACUTE  ONLY): 31 min  Charges:  $Gait Training: 8-22 mins $Therapeutic Exercise: 8-22 mins                    G Codes:      Keny Donald B Densil Ottey June 14, 2016, 9:34 AM Elwyn Reach, [place]

## 2016-06-03 NOTE — Progress Notes (Signed)
CARDIAC REHAB PHASE I   PRE:  Rate/Rhythm: 90 SR    BP: sitting 150/78    SaO2: 97 RA  MODE:  Ambulation: 200 ft   POST:  Rate/Rhythm: 106 ST    BP: sitting 134/61     SaO2: 97 RA  Pt to BSC then ambulated with RW. Fairly steady with RW, needed verbal cues to stand correctly. Slow, tires easily. Slightly more alert today. To recliner. Ed completed with pt and family. Set up d/c video. Will send CRPII referral to [facility].  [place], ACSM 06/03/2016 3:01 PM

## 2016-06-03 NOTE — Progress Notes (Addendum)
[address]       [place] [address]             [phone]      6 Days Post-Op Procedure(s) (LRB): MITRAL VALVE (MV) REPLACEMENT USING 29 MM CARBOMEDICS OPTIFORM MECHANICAL BILEAFLET PROSTHETIC HEART VALVE (N/A) CORONARY ARTERY BYPASS GRAFTING (CABG) x 1 WITH ENDOSCOPIC HARVESTING OF RIGHT SAPHENOUS VEIN, EVH- PDA (N/A) MAZE PROCEDURE AND APPLICATION OF  ATRICLIP LAA PROCLIP II 45 MM (N/A) TRANSESOPHAGEAL ECHOCARDIOGRAM (TEE) (N/A) Subjective: Feeling pretty well, working with PT this am- noted to be orthostatic  Objective: Vital signs in last 24 hours: Temp:  [98 F (36.7 C)-98.5 F (36.9 C)] 98.4 F (36.9 C) (01/25 0628) Pulse Rate:  [95-100] 95 (01/25 0628) Cardiac Rhythm: Bundle branch block;Heart block (01/24 1900) Resp:  [18] 18 (01/25 0628) BP: (121-142)/(52-71) 121/52 (01/25 0628) SpO2:  [95 %-100 %] 95 % (01/25 0628) Weight:  [158 lb 9.6 oz (71.9 kg)] 158 lb 9.6 oz (71.9 kg) (01/25 0628)  Hemodynamic parameters for last 24 hours:    Intake/Output from previous day: 01/24 0701 - 01/25 0700 In: 720 [P.O.:720] Out: 200 [Urine:200] Intake/Output this shift: No intake/output data recorded.  General appearance: alert, cooperative, no distress and a little dizzy Heart: regular rate and rhythm and crisp valve click, no murmur Lungs: dim in bases Abdomen: benign Extremities: no edema Wound: incis healing well  Lab Results:  Recent Labs  06/01/16 0315 06/02/16 0315  WBC 14.0* 12.8*  HGB 7.8* 7.9*  HCT 24.8* 25.1*  PLT 120* 140*   BMET:  Recent Labs  06/01/16 0315 06/02/16 0315  NA 141 140  K 3.4* 4.2  CL 106 106  CO2 25 25  GLUCOSE 108* 123*  BUN 22* 24*  CREATININE 0.94 0.88  CALCIUM 8.9 9.0    PT/INR:  Recent Labs  06/03/16 0356  LABPROT 24.6*  INR 2.18   ABG    Component Value Date/Time   PHART 7.351 05/30/2016 0335   HCO3 24.4 05/30/2016 0335   TCO2 26 05/30/2016 0335   ACIDBASEDEF 1.0 05/30/2016 0335   O2SAT 66.8 05/31/2016 0410   CBG (last 3)  No results for input(s): GLUCAP in the last 72 hours.  Meds Scheduled Meds: . amiodarone  200 mg Oral Daily  . aspirin EC  81 mg Oral Daily  . atorvastatin  80 mg Oral q1800  . bisacodyl  10 mg Oral Daily   Or  . bisacodyl  10 mg Rectal Daily  . docusate sodium  200 mg Oral Daily  . feeding supplement  1 Container Oral TID BM  . lisinopril  5 mg Oral Daily  . mouth rinse  15 mL Mouth Rinse BID  . pantoprazole  40 mg Oral Daily  . sodium chloride flush  3 mL Intravenous Q12H  . sodium chloride flush  3 mL Intravenous Q12H  . Warfarin - Physician Dosing Inpatient   Does not apply q1800   Continuous Infusions: . sodium chloride Stopped (05/31/16 1100)   PRN Meds:.sodium chloride, metoprolol, ondansetron (ZOFRAN) IV, sodium chloride flush, sodium chloride flush, traMADol  Xrays No results found.  Assessment/Plan: S/P Procedure(s) (LRB): MITRAL VALVE (MV) REPLACEMENT USING 29 MM CARBOMEDICS OPTIFORM MECHANICAL BILEAFLET PROSTHETIC HEART VALVE (N/A) CORONARY ARTERY BYPASS GRAFTING (CABG) x 1 WITH ENDOSCOPIC HARVESTING OF RIGHT SAPHENOUS VEIN, EVH- PDA (N/A) MAZE PROCEDURE AND APPLICATION OF  ATRICLIP LAA PROCLIP II 45 MM (N/A) TRANSESOPHAGEAL ECHOCARDIOGRAM (TEE) (N/A)  1 doing well , she  is orthostatic and a little dizzy, not on diuretics currently- will stop lisinopril although she is hypertensive also at times. Check lytes and renal fxn 2 rhythm appears stable, occ missed beat. 3 will give 1 mg coumadin today 4 SNF soon  LOS: 6 days    GOLD,WAYNE E 06/03/2016   I have seen and examined the patient and agree with the assessment and plan as outlined.  Will transfuse 2 units PRBC's for symptomatic anemia.  Possible d/c to SNF tomorrow  Rexene Alberts, MD 06/03/2016 9:11 AM

## 2016-06-04 LAB — CBC
HEMATOCRIT: 32.9 % — AB (ref 36.0–46.0)
HEMOGLOBIN: 10.6 g/dL — AB (ref 12.0–15.0)
MCH: 27.7 pg (ref 26.0–34.0)
MCHC: 32.2 g/dL (ref 30.0–36.0)
MCV: 86.1 fL (ref 78.0–100.0)
Platelets: 186 10*3/uL (ref 150–400)
RBC: 3.82 MIL/uL — ABNORMAL LOW (ref 3.87–5.11)
RDW: 16.4 % — AB (ref 11.5–15.5)
WBC: 11 10*3/uL — ABNORMAL HIGH (ref 4.0–10.5)

## 2016-06-04 LAB — TYPE AND SCREEN
ABO/RH(D): A POS
Antibody Screen: NEGATIVE
Unit division: 0
Unit division: 0

## 2016-06-04 LAB — BASIC METABOLIC PANEL
Anion gap: 9 (ref 5–15)
BUN: 14 mg/dL (ref 4–21)
BUN: 14 mg/dL (ref 6–20)
CALCIUM: 8.8 mg/dL — AB (ref 8.9–10.3)
CHLORIDE: 105 mmol/L (ref 101–111)
CO2: 23 mmol/L (ref 22–32)
CREATININE: 0.68 mg/dL (ref 0.44–1.00)
Creatinine: 0.7 mg/dL (ref 0.5–1.1)
GFR calc Af Amer: >60 mL/min (ref >60)
GFR calc non Af Amer: >60 mL/min (ref >60)
GLUCOSE: 109 mg/dL — AB (ref 65–99)
Glucose: 109 mg/dL
Potassium: 3.7 mmol/L (ref 3.5–5.1)
Sodium: 137 mmol/L (ref 135–145)
Sodium: 137 mmol/L (ref 137–147)

## 2016-06-04 LAB — PROTIME-INR
INR: 1.79
PROTIME: 21.1 s — AB (ref 10.0–13.8)
Prothrombin Time: 21.1 seconds — ABNORMAL HIGH (ref 11.4–15.2)

## 2016-06-04 LAB — CBC AND DIFFERENTIAL: WBC: 11 10*3/mL

## 2016-06-04 MED ORDER — TRAMADOL HCL 50 MG PO TABS: 50.0000 mg | ORAL_TABLET | ORAL | 0 refills | Status: DC | PRN

## 2016-06-04 MED ORDER — WARFARIN SODIUM 2.5 MG PO TABS
2.5000 mg | ORAL_TABLET | Freq: Every day | ORAL | Status: DC
Start: 2016-06-04 — End: 2016-06-04

## 2016-06-04 MED ORDER — LISINOPRIL 10 MG PO TABS: 10.0000 mg | ORAL_TABLET | Freq: Every day | ORAL | Status: DC

## 2016-06-04 MED ORDER — ASPIRIN 81 MG PO TBEC: 81.0000 mg | DELAYED_RELEASE_TABLET | Freq: Every day | ORAL | Status: AC

## 2016-06-04 MED ORDER — LISINOPRIL 10 MG PO TABS
10.0000 mg | ORAL_TABLET | Freq: Every day | ORAL | Status: DC
  Administered 2016-06-04: 10 mg via ORAL
  Filled 2016-06-04: qty 1

## 2016-06-04 MED ORDER — WARFARIN SODIUM 2.5 MG PO TABS: 2.5000 mg | ORAL_TABLET | Freq: Every day | ORAL | Status: DC

## 2016-06-04 NOTE — Progress Notes (Signed)
06/04/2016 3:31 PM Pt discharged to [facility] via [place]. [place]

## 2016-06-04 NOTE — Clinical Social Work Note (Signed)
Clinical Social Worker facilitated patient discharge including contacting patient family and facility to confirm patient discharge plans.  Clinical information faxed to facility and family agreeable with plan.  CSW arranged ambulance transport via [facility] to [facility].  RN to call [phone], hall 200 for report prior to discharge.  Clinical Social Worker will sign off for now as social work intervention is no longer needed. Please consult [place] again if new need arises.  [address] [place]

## 2016-06-04 NOTE — Progress Notes (Signed)
Physical Therapy Treatment Patient Details Name: Marie Malone MRN: [id number] DOB: 1950-10-26 Today's Date: 06/04/2016    History of Present Illness Pt is a 66 yo female s/p MVR and CABG. PMHx: CEA on 03/10/16, mitral stenosis, pulmonary HTN and CVA, CHF, Afib    PT Comments      Follow Up Recommendations  SNF;Supervision for mobility/OOB   Pt able to state 4/5 precautions today and demonstrates understanding with mobility. Pt continues to fatigue after gait and has difficulty maintaining eyes open for HEP with cues. Pt improving and educated for progression, HEp and all precautions.  BP supine 138/75 (92), HR 89 Sitting 126/73 (88), HR 91 Standing 113/100 (106), HR 91 Sitting after walk 145/79 (94), HR 100  sats 100% on RA  Equipment Recommendations       Recommendations for Other Services       Precautions / Restrictions Precautions Precautions: Fall;Sternal Precaution Comments: watch BP Restrictions Weight Bearing Restrictions: Yes (sternal precautions)    Mobility  Bed Mobility Overal bed mobility: Needs Assistance Bed Mobility: Rolling;Sit to Sidelying Rolling: Supervision Sidelying to sit: Min guard       General bed mobility comments: cues for sequence  Transfers Overall transfer level: Needs assistance Equipment used: Rolling walker (2 wheeled) Transfers: Sit to/from Stand Sit to Stand: Supervision         General transfer comment: supervision for safety with correct hand placement on thighs  Ambulation/Gait Ambulation/Gait assistance: Min guard Ambulation Distance (Feet): 350 Feet Assistive device: Rolling walker (2 wheeled) Gait Pattern/deviations: Step-through pattern;Decreased stride length;Trunk flexed   Gait velocity interpretation: Below normal speed for age/gender General Gait Details: cues for posture and position in RW   Stairs            Wheelchair Mobility    Modified Rankin (Stroke Patients Only)       Balance  Overall balance assessment: Needs assistance Sitting-balance support: Feet supported;No upper extremity supported Sitting balance-Leahy Scale: Good     Standing balance support: No upper extremity supported;During functional activity Standing balance-Leahy Scale: Fair                      Cognition Arousal/Alertness: Awake/alert Behavior During Therapy: WFL for tasks assessed/performed Overall Cognitive Status: Within Functional Limits for tasks assessed                      Exercises General Exercises - Lower Extremity Long Arc Quad: AROM;Both;Seated;15 reps Hip Flexion/Marching: AROM;Both;Seated;15 reps    General Comments        Pertinent Vitals/Pain Pain Assessment: No/denies pain    Home Living                      Prior Function            PT Goals (current goals can now be found in the care plan section) Acute Rehab PT Goals Patient Stated Goal: rehab then home Progress towards PT goals: Progressing toward goals    Frequency           PT Plan Current plan remains appropriate    Co-evaluation             End of Session Equipment Utilized During Treatment: Gait belt Activity Tolerance: Patient tolerated treatment well Patient left: in chair;with call bell/phone within reach;with family/visitor present     Time: 0735-0802 PT Time Calculation (min) (ACUTE ONLY): 27 min  Charges:  $Gait Training: 8-22 mins $Therapeutic  Exercise: 8-22 mins                    G Codes:      Emmalea Treanor B Valentin Benney 06-06-2016, 10:37 AM Elwyn Reach, [place]

## 2016-06-04 NOTE — Progress Notes (Signed)
Occupational Therapy Treatment Patient Details Name: Marie Malone MRN: [id number] DOB: Jul 18, 1950 Today's Date: 06/04/2016    History of present illness Pt is a 66 yo female s/p MVR and CABG. PMHx: CEA on 03/10/16, mitral stenosis, pulmonary HTN and CVA, CHF, Afib   OT comments  Pt able to perform toilet transfer ambulating to the bathroom with min guard assist and complete grooming tasks in standing with supervision. Pt able to verbally recall 4/5 sternal precautions but maintain all precautions throughout functional activities without cues. D/c plan remains appropriate. Will continue to follow acutely.   Follow Up Recommendations  SNF;Supervision/Assistance - 24 hour    Equipment Recommendations  Other (comment) (TBD at next venue)    Recommendations for Other Services      Precautions / Restrictions Precautions Precautions: Fall;Sternal Precaution Comments: watch BP Restrictions Weight Bearing Restrictions: Yes (sternal precautions)       Mobility Bed Mobility               General bed mobility comments: Pt OOB in chair upon arrival  Transfers Overall transfer level: Needs assistance Equipment used: Rolling walker (2 wheeled) Transfers: Sit to/from Stand Sit to Stand: Supervision         General transfer comment: Good hand placement and technique for sit to stand from chair x1, toilet x1    Balance Overall balance assessment: Needs assistance Sitting-balance support: Feet supported;No upper extremity supported Sitting balance-Leahy Scale: Good     Standing balance support: No upper extremity supported;During functional activity Standing balance-Leahy Scale: Fair                     ADL Overall ADL's : Needs assistance/impaired     Grooming: Supervision/safety;Standing;Wash/dry hands;Wash/dry face;Oral care                   Toilet Transfer: Min guard;Ambulation;Regular Toilet;RW   Toileting- Clothing Manipulation and Hygiene:  Supervision/safety;Sit to/from stand       Functional mobility during ADLs: Min guard;Rolling walker General ADL Comments: Pt able to recall 4/5 sternal precautions; reviewed all precautions with pt and husband. Pt able to maintain sternal precautions throughout functional activities without cues.      Vision                     Perception     Praxis      Cognition   Behavior During Therapy: WFL for tasks assessed/performed Overall Cognitive Status: Within Functional Limits for tasks assessed                       Extremity/Trunk Assessment               Exercises     Shoulder Instructions       General Comments      Pertinent Vitals/ Pain       Pain Assessment: No/denies pain  Home Living                                          Prior Functioning/Environment              Frequency  Min 2X/week        Progress Toward Goals  OT Goals(current goals can now be found in the care plan section)  Progress towards OT goals: Progressing toward goals  Acute Rehab OT  Goals Patient Stated Goal: rehab then home OT Goal Formulation: With patient/family  Plan Discharge plan remains appropriate    Co-evaluation                 End of Session Equipment Utilized During Treatment: Rolling walker   Activity Tolerance Patient tolerated treatment well   Patient Left in chair;with call bell/phone within reach;with family/visitor present   Nurse Communication          Time: 9842-1031 OT Time Calculation (min): 16 min  Charges: OT General Charges $OT Visit: 1 Procedure OT Treatments $Self Care/Home Management : 8-22 mins  Binnie Kand M.S., OTR/L Pager: [phone]  06/04/2016, 8:54 AM

## 2016-06-04 NOTE — Clinical Social Work Note (Signed)
Pt has been approved for insurance to go to [facility]. MD please provide d/c summary before 3:00.  [address], [place]

## 2016-06-04 NOTE — Progress Notes (Signed)
[address]       [employer] [address]             [phone]      7 Days Post-Op Procedure(s) (LRB): MITRAL VALVE (MV) REPLACEMENT USING 29 MM CARBOMEDICS OPTIFORM MECHANICAL BILEAFLET PROSTHETIC HEART VALVE (N/A) CORONARY ARTERY BYPASS GRAFTING (CABG) x 1 WITH ENDOSCOPIC HARVESTING OF RIGHT SAPHENOUS VEIN, EVH- PDA (N/A) MAZE PROCEDURE AND APPLICATION OF  ATRICLIP LAA PROCLIP II 45 MM (N/A) TRANSESOPHAGEAL ECHOCARDIOGRAM (TEE) (N/A) Subjective: Feeling better after blood transfusion  Objective: Vital signs in last 24 hours: Temp:  [97.9 F (36.6 C)-99 F (37.2 C)] 98.1 F (36.7 C) (01/26 0419) Pulse Rate:  [87-91] 87 (01/26 0419) Cardiac Rhythm: Heart block (01/25 1900) Resp:  [14-18] 16 (01/26 0419) BP: (124-154)/(65-83) 141/81 (01/26 0419) SpO2:  [92 %-100 %] 97 % (01/26 0419) Weight:  [157 lb 14.4 oz (71.6 kg)] 157 lb 14.4 oz (71.6 kg) (01/26 0419)  Hemodynamic parameters for last 24 hours:    Intake/Output from previous day: 01/25 0701 - 01/26 0700 In: 1312.9 [Blood:1312.9] Out: -  Intake/Output this shift: No intake/output data recorded.  General appearance: alert, cooperative and no distress Heart: regular rate and rhythm Lungs: mildly dim in bases Abdomen: benign Extremities: no edema Wound: incis healing well  Lab Results:  Recent Labs  06/02/16 0315 06/04/16 0326  WBC 12.8* 11.0*  HGB 7.9* 10.6*  HCT 25.1* 32.9*  PLT 140* 186   BMET:  Recent Labs  06/03/16 0833 06/04/16 0326  NA 138 137  K 3.8 3.7  CL 106 105  CO2 26 23  GLUCOSE 200* 109*  BUN 21* 14  CREATININE 0.95 0.68  CALCIUM 8.8* 8.8*    PT/INR:  Recent Labs  06/04/16 0326  LABPROT 21.1*  INR 1.79   ABG    Component Value Date/Time   PHART 7.351 05/30/2016 0335   HCO3 24.4 05/30/2016 0335   TCO2 26 05/30/2016 0335   ACIDBASEDEF 1.0 05/30/2016 0335   O2SAT 66.8 05/31/2016 0410   CBG (last 3)  No results for input(s): GLUCAP in the last 72  hours.  Meds Scheduled Meds: . amiodarone  200 mg Oral Daily  . aspirin EC  81 mg Oral Daily  . atorvastatin  80 mg Oral q1800  . bisacodyl  10 mg Oral Daily   Or  . bisacodyl  10 mg Rectal Daily  . docusate sodium  200 mg Oral Daily  . feeding supplement  1 Container Oral TID BM  . mouth rinse  15 mL Mouth Rinse BID  . pantoprazole  40 mg Oral Daily  . sodium chloride flush  3 mL Intravenous Q12H  . sodium chloride flush  3 mL Intravenous Q12H  . warfarin  1 mg Oral q1800  . Warfarin - Physician Dosing Inpatient   Does not apply q1800   Continuous Infusions: . sodium chloride Stopped (05/31/16 1100)   PRN Meds:.sodium chloride, metoprolol, ondansetron (ZOFRAN) IV, sodium chloride flush, sodium chloride flush, traMADol  Xrays No results found.  Assessment/Plan: S/P Procedure(s) (LRB): MITRAL VALVE (MV) REPLACEMENT USING 29 MM CARBOMEDICS OPTIFORM MECHANICAL BILEAFLET PROSTHETIC HEART VALVE (N/A) CORONARY ARTERY BYPASS GRAFTING (CABG) x 1 WITH ENDOSCOPIC HARVESTING OF RIGHT SAPHENOUS VEIN, EVH- PDA (N/A) MAZE PROCEDURE AND APPLICATION OF  ATRICLIP LAA PROCLIP II 45 MM (N/A) TRANSESOPHAGEAL ECHOCARDIOGRAM (TEE) (N/A)  1 doing well, rhythm stable 2 improved after blood, I think we can restart ace-I for htn 3 increase coumadin to  2.5 daily for now  4 SNF when bed available  LOS: 7 days    Mata Rowen E 06/04/2016

## 2016-06-04 NOTE — Care Management Note (Signed)
Case Management Note Previous CM note initiated by [name], RN 05/31/2016, 11:13 AM   Patient Details  Name: Marie Malone MRN: [id number] Date of Birth: 11/01/1950  Subjective/Objective:    Pt is s/p MVR and CABG                Action/Plan:   PTA independent from home with husband.  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  Daughter also at bedside and voiced concerns with pts husband being able to provide adequate supervision - [place],  CM explained to that supervision is to be able to contact emergency help if needed.  PT eval ordered   Expected Discharge Date:  06/04/16               Expected Discharge Plan:  [place]  In-House Referral:  Clinical Social Work  Discharge planning Services  CM Consult  Post Acute Care Choice:  NA Choice offered to:  NA  DME Arranged:    DME Agency:     HH Arranged:    [place] Agency:     Status of Service:  Completed, signed off  If discussed at [employer] of Stay Meetings, dates discussed:  1/25  Discharge Disposition: skilled facility   Additional Comments:  06/04/16- 1325- Danice Dippolito RN, CM- pt for d/c today- per PT/OT recommendations- plan for STSNF- CSW following for placement needs- plan for [facility] SNF.   06/02/16- 1000- Raelle Chambers RN, CM- PT/OT evals recommended- STSNF- CSW has been consulted and working on STSNF placement when pt medically stable for discharge.   06/01/16- 1130- Marvetta Gibbons RN, CM- pt tx from 2S to 2W on 05/31/16- spoke with pt and daughter at bedside- regarding d/c plans- daughter would like to look at [facility] at discharge- PT eval pending this AM- explained to daughter process for STSNF- and need for insurance approval- pt has spouse at home but there are concerns that spouse able to provide needed supervision due to his own cognitive difficulties- pt's has sisters that can provide  some assistance however would be best if pt went to rehab first to maximize independence prior to returning home.- CM to follow after PT eval completed and consult CSW for possible placement needs.   Dahlia Client [place], RN 06/04/2016, 1:28 PM [phone]

## 2016-06-07 ENCOUNTER — Non-Acute Institutional Stay (SKILLED_NURSING_FACILITY): Payer: BC Managed Care – PPO | Admitting: Internal Medicine

## 2016-06-07 ENCOUNTER — Encounter: Payer: Self-pay | Admitting: Internal Medicine

## 2016-06-07 DIAGNOSIS — I10 Essential (primary) hypertension: Secondary | ICD-10-CM

## 2016-06-07 DIAGNOSIS — Z8673 Personal history of transient ischemic attack (TIA), and cerebral infarction without residual deficits: Secondary | ICD-10-CM | POA: Diagnosis not present

## 2016-06-07 DIAGNOSIS — I2510 Atherosclerotic heart disease of native coronary artery without angina pectoris: Secondary | ICD-10-CM

## 2016-06-07 DIAGNOSIS — I63412 Cerebral infarction due to embolism of left middle cerebral artery: Secondary | ICD-10-CM | POA: Diagnosis not present

## 2016-06-07 DIAGNOSIS — Z9889 Other specified postprocedural states: Secondary | ICD-10-CM | POA: Diagnosis not present

## 2016-06-07 DIAGNOSIS — I050 Rheumatic mitral stenosis: Secondary | ICD-10-CM

## 2016-06-07 DIAGNOSIS — I051 Rheumatic mitral insufficiency: Secondary | ICD-10-CM | POA: Diagnosis not present

## 2016-06-07 DIAGNOSIS — Z954 Presence of other heart-valve replacement: Secondary | ICD-10-CM | POA: Diagnosis not present

## 2016-06-07 DIAGNOSIS — I480 Paroxysmal atrial fibrillation: Secondary | ICD-10-CM | POA: Diagnosis not present

## 2016-06-07 DIAGNOSIS — E785 Hyperlipidemia, unspecified: Secondary | ICD-10-CM | POA: Diagnosis not present

## 2016-06-07 DIAGNOSIS — Z951 Presence of aortocoronary bypass graft: Secondary | ICD-10-CM | POA: Diagnosis not present

## 2016-06-07 DIAGNOSIS — D62 Acute posthemorrhagic anemia: Secondary | ICD-10-CM | POA: Diagnosis not present

## 2016-06-07 DIAGNOSIS — Z8679 Personal history of other diseases of the circulatory system: Secondary | ICD-10-CM

## 2016-06-07 NOTE — Progress Notes (Signed)
: Provider:  Noah Delaine. Sheppard Coil, MD Location:  [place] Room Number: 208W Place of Service:  SNF (31)  PCP: [name], NP Patient Care Team: Debbrah Alar, NP as PCP - General (Internal Medicine) Deneise Lever, MD as Consulting Physician (Pulmonary Disease) Collene Gobble, MD as Consulting Physician (Pulmonary Disease) Deboraha Sprang, MD as Consulting Physician (Cardiology) [name], MD as Consulting Physician (Cardiology) Lorretta Harp, MD as Consulting Physician (Cardiology)  Extended Emergency Contact Information Primary Emergency Contact: [place] Phone: [phone] Relation: None Secondary Emergency Contact: [place] Phone: [phone] Relation: Daughter     Allergies: No known allergies  Chief Complaint  Patient presents with  . New Admit To SNF    Admit to Facility    HPI: Patient is 66 y.o. female who who was admitted to [facility] from 1/19-26 for a planned MV replacement, and one vessel CABG after presenting with a syncopal episode in 04/2016.  Pt had been followed by Dr [name] since 2014 after being followed in [place] for many years for mitral stenosis felt 2/2 rheumatic heart disease.  In her 04/2016 hospitalization stenosis was found to be severe and pt had 100% occlusion of RCA as well and was set up for for replacement of MV with a mechanical valve + CABG X1 + maze procedure which she underwent 05/28/2016. Pt recovered nicely in SICU with expected post-op anemia and thrombocytopenia . She also had some post -op volume overload that has responded with diuretics. Pt was  started on coumadin for her mechanical valve. Pt is admitted to SNF for OT/PT. While at SNF pt will be followed for HTN, tx with lisinopril, HLD, tx with lipitor and s/p CVA, tx with ASA and warfarin.   Past Medical History:  Diagnosis Date  . Atrial fibrillation ([place])   .  Chronic diastolic CHF (congestive heart failure) ([place])   . Complication of anesthesia    difficult to wake up from anesthesia  . Coronary artery disease involving native coronary artery without angina pectoris 03/26/2016   100% chronic occlusion of RCA  . Dyspnea   . Heart murmur   . History of rheumatic fever    as a child  . Hyperlipidemia   . Hypertension   . Mitral stenosis   . OSA (obstructive sleep apnea)    mild per sleep study 2008  . Paroxysmal atrial fibrillation (H[facility])   . Pneumonia    double pneumonia once  . Pulmonary hypertension   . Rheumatic mitral regurgitation   . S/P balloon mitral valvuloplasty 2008   . 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 S/P mitral valve replacement with mechanical valve 05/28/2016   29 mm Sorin [employer]omedics Optiform bileaflet mechanical prosthetic valve  . Stroke [facility])    x2    Past Surgical History:  Procedure Laterality Date  . APPENDECTOMY  1989   appendix ruptured,had peritonitis  . BALLOON VALVULOPLASTY  2008   [facility]  . CARDIAC CATHETERIZATION N/A 03/25/2016   Procedure: Right/Left Heart Cath and Coronary Angiography;  Surgeon: Jettie Booze, MD;  Location: [place] CV LAB;  Service: Cardiovascular;  Laterality: N/A;  . COLONOSCOPY W/ POLYPECTOMY    . CORONARY ARTERY BYPASS GRAFT N/A 05/28/2016   Procedure: CORONARY ARTERY BYPASS GRAFTING (CABG) x 1 WITH ENDOSCOPIC HARVESTING OF RIGHT SAPHENOUS VEIN, EVH- PDA;  Surgeon: Braulio Conte  Keturah Barre, MD;  Location: [place];  Service: Open Heart Surgery;  Laterality: N/A;  . ENDARTERECTOMY Right 03/10/2016   Procedure: ENDARTERECTOMY CAROTID RIGHT;  Surgeon: Serafina Mitchell, MD;  Location: [place];  Service: Vascular;  Laterality: Right;  . EP IMPLANTABLE DEVICE N/A 03/11/2016   Procedure: Loop Recorder Removal;  Surgeon: Deboraha Sprang, MD;  Location: [place] CV LAB;  Service: Cardiovascular;  Laterality: N/A;  . LOOP RECORDER IMPLANT  04-10-2013   MDT LinQ  implanted by Dr Rayann Heman for cryptogenic stroke  . LOOP RECORDER IMPLANT N/A 04/10/2013   Procedure: LOOP RECORDER IMPLANT;  Surgeon: Coralyn Mark, MD;  Location: [place] CATH LAB;  Service: Cardiovascular;  Laterality: N/A;  . MAZE N/A 05/28/2016   Procedure: MAZE PROCEDURE AND APPLICATION OF  ATRICLIP LAA PROCLIP II 37 MM;  Surgeon: Rexene Alberts, MD;  Location: [place];  Service: Open Heart Surgery;  Laterality: N/A;  . MITRAL VALVE REPLACEMENT N/A 05/28/2016   Procedure: MITRAL VALVE (MV) REPLACEMENT USING 29 MM CARBOMEDICS OPTIFORM MECHANICAL BILEAFLET PROSTHETIC HEART VALVE;  Surgeon: Rexene Alberts, MD;  Location: [place];  Service: Open Heart Surgery;  Laterality: N/A;  . MULTIPLE EXTRACTIONS WITH ALVEOLOPLASTY N/A 03/30/2016   Procedure: EXTRACTION OF TOOTH #'S 2,5,6,11,15,16,20-30 AND 32 WITH ALVEOLOPLASTY AND BILATERAL MAXILLARY LATERAL EXOSTOSES REDUCTIONS;  Surgeon: Lenn Cal, DDS;  Location: [place];  Service: Oral Surgery;  Laterality: N/A;  . PATCH ANGIOPLASTY Right 03/10/2016   Procedure: PATCH ANGIOPLASTY CAROTID RIGHT USING Rueben Bash BIOLOGIC PATCH;  Surgeon: Serafina Mitchell, MD;  Location: [place];  Service: Vascular;  Laterality: Right;  . TEE WITHOUT CARDIOVERSION N/A 04/10/2013   Procedure: TRANSESOPHAGEAL ECHOCARDIOGRAM (TEE);  Surgeon: [name], MD;  Location: [facility] ENDOSCOPY;  Service: Cardiovascular;  Laterality: N/A;  . TEE WITHOUT CARDIOVERSION N/A 03/24/2016   Procedure: TRANSESOPHAGEAL ECHOCARDIOGRAM (TEE);  Surgeon: Jerline Pain, MD;  Location: [place];  Service: Cardiovascular;  Laterality: N/A;  . TEE WITHOUT CARDIOVERSION N/A 05/28/2016   Procedure: TRANSESOPHAGEAL ECHOCARDIOGRAM (TEE);  Surgeon: Rexene Alberts, MD;  Location: [place];  Service: Open Heart Surgery;  Laterality: N/A;  . TUBAL LIGATION      Allergies as of 06/07/2016      Reactions   No Known Allergies       Medication List       Accurate as of 06/07/16 11:59 PM. Always use your most recent  med list.          amiodarone 200 MG tablet Commonly known as:  PACERONE Take 1 tablet (200 mg total) by mouth 2 (two) times daily.   aspirin 81 MG EC tablet Take 1 tablet (81 mg total) by mouth daily.   atorvastatin 80 MG tablet Commonly known as:  LIPITOR TAKE ONE TABLET BY MOUTH ONCE DAILY AT 6 PM   chlorhexidine 0.12 % solution Commonly known as:  PERIDEX Rinse with 15 mLs bid after breakfast and at bedtime.  Use in a swish and spit manner.   docusate sodium 100 MG capsule Commonly known as:  COLACE Take 1 capsule (100 mg total) by mouth 2 (two) times daily.   feeding supplement Liqd Take 1 Container by mouth 2 (two) times daily between meals.   lisinopril 10 MG tablet Commonly known as:  PRINIVIL,ZESTRIL Take 1 tablet (10 mg total) by mouth daily.   polyethylene glycol packet Commonly known as:  MIRALAX / GLYCOLAX Take 17 g by mouth daily.   traMADol 50 MG tablet Commonly  known as:  ULTRAM Take 50-100 mg by mouth every 4 (four) hours as needed for moderate pain or severe pain. Take 1 tablet (50 mg) for moderate pain and 2 tablets (100 mg) for severe pain.   warfarin 2.5 MG tablet Commonly known as:  COUMADIN Take 1 tablet (2.5 mg total) by mouth daily at 6 PM.       Meds ordered this encounter  Medications  . traMADol (ULTRAM) 50 MG tablet    Sig: Take 50-100 mg by mouth every 4 (four) hours as needed for moderate pain or severe pain. Take 1 tablet (50 mg) for moderate pain and 2 tablets (100 mg) for severe pain.  . feeding supplement (BOOST / RESOURCE BREEZE) LIQD    Sig: Take 1 Container by mouth 2 (two) times daily between meals.    Immunization History  Administered Date(s) Administered  . Influenza,inj,Quad PF,36+ Mos 04/02/2013, 02/13/2014  . Tdap 05/10/2009, 08/09/2011    Social History  Substance Use Topics  . Smoking status: Former Smoker    Packs/day: 1.00    Years: 5.00    Types: Cigarettes    Start date: 07/15/1970    Quit date:  05/11/1975  . Smokeless tobacco: Never Used     Comment: quit smoking 36 years ago  . Alcohol use No     Comment: drank years ago when young    Family history is   Family History  Problem Relation Age of Onset  . Diabetes Mother   . Stroke Mother   . Diabetes Father   . Heart disease Father     CHF  . Cancer Father     kidney  . Diabetes Sister   . Diabetes Brother   . Hypertension Daughter       Review of Systems  DATA OBTAINED: from patient,  family member GENERAL:  no fevers, fatigue, appetite changes SKIN: No itching, or rash EYES: No eye pain, redness, discharge EARS: No earache, tinnitus, change in hearing NOSE: No congestion, drainage or bleeding  MOUTH/THROAT: No mouth or tooth pain, No sore throat RESPIRATORY: No cough, wheezing, SOB CARDIAC: No chest pain, palpitations, lower extremity edema  GI: No abdominal pain, No N/V/D or constipation, No heartburn or reflux  GU: No dysuria, frequency or urgency, or incontinence  MUSCULOSKELETAL: No unrelieved bone/joint pain NEUROLOGIC: No headache, dizziness or focal weakness PSYCHIATRIC: No c/o anxiety or sadness   Vitals:   06/07/16 0856  BP: 138/77  Pulse: 82  Resp: 18  Temp: 98.1 F (36.7 C)    SpO2 Readings from Last 1 Encounters:  06/04/16 97%   Body mass index is 27.94 kg/m.     Physical Exam  GENERAL APPEARANCE: Alert, conversant,  No acute distress.  SKIN: No diaphoresis rash;incision looks good HEAD: Normocephalic, atraumatic  EYES: Conjunctiva/lids clear. Pupils round, reactive. EOMs intact.  EARS: External exam WNL, canals clear. Hearing grossly normal.  NOSE: No deformity or discharge.  MOUTH/THROAT: Lips w/o lesions  RESPIRATORY: Breathing is even, unlabored. Lung sounds are clear   CARDIOVASCULAR: Heart RRR no murmurs, sharp click of mechanical valve, no rubs or gallops. No peripheral edema.   GASTROINTESTINAL: Abdomen is soft, non-tender, not distended w/ normal bowel  sounds. GENITOURINARY: Bladder non tender, not distended  MUSCULOSKELETAL: No abnormal joints or musculature NEUROLOGIC:  Cranial nerves 2-12 grossly intact. Moves all extremities  PSYCHIATRIC: Mood and affect appropriate to situation, no behavioral issues  Patient Active Problem List   Diagnosis Date Noted  . S/P mitral valve  replacement with mechanical valve + CABG x1 + Maze procedure 05/28/2016  . S/P CABG x 1 05/28/2016  . S/P Maze operation for atrial fibrillation 05/28/2016  . Rheumatic mitral regurgitation   . Chronic diastolic CHF (congestive heart failure) ([place])   . Coronary artery disease involving native coronary artery without angina pectoris 03/26/2016  . Second degree Mobitz I AV block 03/25/2016  . Syncope   . Tongue deviation   . Altered mental status 03/23/2016  . Transient alteration of awareness 03/21/2016  . Constipation 03/19/2016  . Asymptomatic stenosis of right carotid artery s/p right CEA 03/10/16 03/10/2016  . Carotid stenosis 04/09/2014  . Cerebral infarction due to embolism of left middle cerebral artery ([place]) 04/09/2014  . HLD (hyperlipidemia) 04/09/2014  . Valvular vegetation 04/09/2014  . Urinary urgency 12/03/2013  . Encounter for therapeutic drug monitoring 06/04/2013  . Paroxysmal atrial fibrillation ([place]) 04/19/2013  . Cardiac device in situ 04/19/2013  . Endocarditis 04/13/2013  . History of CVA (cerebrovascular accident) 04/12/2013  . Pulmonary hypertension 04/06/2013  . S/P balloon mitral valvuloplasty 2008   . Mitral valve stenosis, rheumatic 10/25/2012  . HTN (hypertension) 08/30/2012  . History of rheumatic fever 08/30/2012      Labs reviewed: Basic Metabolic Panel:    Component Value Date/Time   NA 137 06/04/2016 0326   K 3.7 06/04/2016 0326   CL 105 06/04/2016 0326   CO2 23 06/04/2016 0326   GLUCOSE 109 (H) 06/04/2016 0326   BUN 14 06/04/2016 0326   CREATININE 0.68 06/04/2016 0326   CREATININE 0.98 01/30/2016 1410   CALCIUM  8.8 (L) 06/04/2016 0326   PROT 6.9 06/01/2016 0315   ALBUMIN 3.3 (L) 06/01/2016 0315   AST 63 (H) 06/01/2016 0315   ALT 43 06/01/2016 0315   ALKPHOS 56 06/01/2016 0315   BILITOT 2.9 (H) 06/01/2016 0315   GFRNONAA >60 06/04/2016 0326   GFRNONAA 64 12/17/2013 1607   GFRAA >60 06/04/2016 0326   GFRAA 74 12/17/2013 1607     Recent Labs  03/19/16 1028  05/28/16 2230 05/29/16 0359 05/29/16 1717  06/02/16 0315 06/03/16 0833 06/04/16 0326  NA  --   < > 143  146* 145 147*  < > 140 138 137  K  --   < > 3.7  3.6 4.1 4.3  < > 4.2 3.8 3.7  CL  --   < > 110  109 117* 112*  < > 106 106 105  CO2  --   < > 21* 23  --   < > 25 26 23   GLUCOSE  --   < > 213*  215* 127* 139*  < > 123* 200* 109*  BUN  --   < > 6  5* 5* 10  < > 24* 21* 14  CREATININE  --   < > 0.78  0.60 0.82 1.16*  1.10*  < > 0.88 0.95 0.68  CALCIUM  --   < > 8.3* 8.4*  --   < > 9.0 8.8* 8.8*  MG  --   --  2.7* 2.4 2.3  --   --   --   --   PHOS 3.7  --   --   --   --   --   --   --   --   < > = values in this interval not displayed. Liver Function Tests:  Recent Labs  03/27/16 0258 05/28/16 0659 06/01/16 0315  AST 22 46* 63*  ALT 19 30 43  ALKPHOS 65 70 56  BILITOT 1.0 1.4* 2.9*  PROT 7.8 8.0 6.9  ALBUMIN 3.5 4.2 3.3*   No results for input(s): LIPASE, AMYLASE in the last 8760 hours. No results for input(s): AMMONIA in the last 8760 hours. CBC:  Recent Labs  10/07/15 1642 01/20/16 1539  03/19/16 0318  06/01/16 0315 06/02/16 0315 06/04/16 0326  WBC 8.5 4.1  < > 5.6  < > 14.0* 12.8* 11.0*  NEUTROABS 5,950 1.8  --  3.4  --   --   --   --   HGB 11.7 12.7  < > 11.9*  < > 7.8* 7.9* 10.6*  HCT 36.2 39.4  < > 37.6  < > 24.8* 25.1* 32.9*  MCV 84.0 83.8  < > 84.3  < > 84.6 85.7 86.1  PLT 132* 170.0  < > 183  < > 120* 140* 186  < > = values in this interval not displayed. Lipid  Recent Labs  07/22/15 1444  CHOL 164  HDL 68.90  LDLCALC 77  TRIG 91.0    Cardiac Enzymes: No results for input(s):  CKTOTAL, CKMB, CKMBINDEX, TROPONINI in the last 8760 hours. BNP: No results for input(s): BNP in the last 8760 hours. No results found for: [facility] Lab Results  Component Value Date   HGBA1C 5.5 05/28/2016   Lab Results  Component Value Date   TSH 0.92 01/20/2016   Lab Results  Component Value Date   VITAMINB12 309 04/22/2014   Lab Results  Component Value Date   FOLATE 9.0 04/22/2014   No results found for: IRON, TIBC, FERRITIN  Imaging and Procedures obtained prior to SNF admission: No results found.   Not all labs, radiology exams or other studies done during hospitalization come through on my EPIC note; however they are reviewed by me.    Assessment and Plan  SEVERE MITRAL STENOSIS/TYPE 3 A RHEUMATIC/MITRAL REGURG/ SEVERE SINGLE VESSEL CAD/ CHRONIC A FIB/ POST-OP ANEMIA - s/p mitral valve replacement with mechnical valve + CABG X 1 -RCA- + maze procedure for AF; had had prior balloon valvuloplasty 2008 at [facility]; had expected post -op anemia - 7.9; thrombocytopenia-PLT 140 - some post op volume overload resolved with diuretics and renal fx stable with d/c BUN/Cr 24/0.88; coumadin started for mechanical valve SNF - admitted for OT/PT;  f/u CBC and BMP; cont coumadin which will be actively titrated and amiodarone for AF  S/p EMBOLIC CVA x 2 - one in 2010, second in 2014 in L MCA area with R side weakness that impoved quickly; cont ASA 81 mg and now on coumadin as well  HTN  SNF - controlled;cont lisinopril 10 mg daily  HLD SNF - not stated as uncontrolled; cont lipitor 80mg  daily    Time spent> 45 min;> 50% of time with patient was spent reviewing records, labs, tests and studies, counseling and developing plan of care  Webb Silversmith D. Sheppard Coil, MD

## 2016-06-08 ENCOUNTER — Encounter: Payer: Self-pay | Admitting: Internal Medicine

## 2016-06-08 DIAGNOSIS — D62 Acute posthemorrhagic anemia: Secondary | ICD-10-CM | POA: Insufficient documentation

## 2016-06-09 ENCOUNTER — Non-Acute Institutional Stay (SKILLED_NURSING_FACILITY): Payer: BC Managed Care – PPO | Admitting: Internal Medicine

## 2016-06-09 DIAGNOSIS — Z20828 Contact with and (suspected) exposure to other viral communicable diseases: Secondary | ICD-10-CM | POA: Diagnosis not present

## 2016-06-11 ENCOUNTER — Encounter: Payer: Self-pay | Admitting: Internal Medicine

## 2016-06-11 ENCOUNTER — Non-Acute Institutional Stay (SKILLED_NURSING_FACILITY): Payer: BC Managed Care – PPO | Admitting: Internal Medicine

## 2016-06-11 DIAGNOSIS — T8131XA Disruption of external operation (surgical) wound, not elsewhere classified, initial encounter: Secondary | ICD-10-CM

## 2016-06-11 DIAGNOSIS — M7989 Other specified soft tissue disorders: Secondary | ICD-10-CM

## 2016-06-11 NOTE — Progress Notes (Signed)
Location:  [place] Room Number: 208W Place of Service:  SNF [phone])  Marie Malone. Marie Coil, MD  Patient Care Team: Debbrah Alar, NP as PCP - General (Internal Medicine) Deneise Lever, MD as Consulting Physician (Pulmonary Disease) Collene Gobble, MD as Consulting Physician (Pulmonary Disease) Deboraha Sprang, MD as Consulting Physician (Cardiology) [name], MD as Consulting Physician (Cardiology) Lorretta Harp, MD as Consulting Physician (Cardiology)  Extended Emergency Contact Information Primary Emergency Contact: [place] Phone: [phone] Relation: None Secondary Emergency Contact: [place] of [place] Phone: [phone] Relation: Daughter    Allergies: No known allergies  Chief Complaint  Patient presents with  . Acute Visit    Acute    HPI: Patient is 65 y.o. female who the wound care nurse asked me to see the site of a R thigh vein graft that has dehised. She has has expressed some blood from it and would like my help in exploring wound further.   Past Medical History:  Diagnosis Date  . Atrial fibrillation ([place])   . Chronic diastolic CHF (congestive heart failure) ([place])   . Complication of anesthesia    difficult to wake up from anesthesia  . Coronary artery disease involving native coronary artery without angina pectoris 03/26/2016   100% chronic occlusion of RCA  . Dyspnea   . Heart murmur   . History of rheumatic fever    as a child  . Hyperlipidemia   . Hypertension   . Mitral stenosis   . OSA (obstructive sleep apnea)    mild per sleep study 2008  . Paroxysmal atrial fibrillation ([facility])   . Pneumonia    double pneumonia once  . Pulmonary hypertension   . Rheumatic mitral regurgitation   . S/P balloon mitral valvuloplasty 2008   . 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 S/P mitral valve replacement with  mechanical valve 05/28/2016   29 mm Sorin Carbomedics Optiform bileaflet mechanical prosthetic valve  . Stroke [facility])    x2    Past Surgical History:  Procedure Laterality Date  . APPENDECTOMY  1989   appendix ruptured,had peritonitis  . BALLOON VALVULOPLASTY  2008   [facility]  . CARDIAC CATHETERIZATION N/A 03/25/2016   Procedure: Right/Left Heart Cath and Coronary Angiography;  Surgeon: Jettie Booze, MD;  Location: [place] CV LAB;  Service: Cardiovascular;  Laterality: N/A;  . COLONOSCOPY W/ POLYPECTOMY    . CORONARY ARTERY BYPASS GRAFT N/A 05/28/2016   Procedure: CORONARY ARTERY BYPASS GRAFTING (CABG) x 1 WITH ENDOSCOPIC HARVESTING OF RIGHT SAPHENOUS VEIN, EVH- PDA;  Surgeon: Rexene Alberts, MD;  Location: [place];  Service: Open Heart Surgery;  Laterality: N/A;  . ENDARTERECTOMY Right 03/10/2016   Procedure: ENDARTERECTOMY CAROTID RIGHT;  Surgeon: Serafina Mitchell, MD;  Location: [place];  Service: Vascular;  Laterality: Right;  . EP IMPLANTABLE DEVICE N/A 03/11/2016   Procedure: Loop Recorder Removal;  Surgeon: Deboraha Sprang, MD;  Location: [place] CV LAB;  Service: Cardiovascular;  Laterality: N/A;  . 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 LOOP RECORDER IMPLANT N/A 04/10/2013   Procedure: LOOP RECORDER IMPLANT;  Surgeon: Coralyn Mark, MD;  Location: [place] CATH LAB;  Service: Cardiovascular;  Laterality: N/A;  . MAZE N/A 05/28/2016   Procedure: MAZE PROCEDURE AND APPLICATION OF  ATRICLIP LAA PROCLIP II 28 MM;  Surgeon: Rexene Alberts, MD;  Location: [place];  Service: Open Heart Surgery;  Laterality: N/A;  . MITRAL VALVE REPLACEMENT N/A 05/28/2016   Procedure: MITRAL VALVE (MV) REPLACEMENT USING 29 MM CARBOMEDICS OPTIFORM MECHANICAL BILEAFLET PROSTHETIC HEART VALVE;  Surgeon: Rexene Alberts, MD;  Location: [place];  Service: Open Heart Surgery;  Laterality: N/A;  . MULTIPLE EXTRACTIONS WITH ALVEOLOPLASTY N/A 03/30/2016   Procedure:  EXTRACTION OF TOOTH #'S 2,5,6,11,15,16,20-30 AND 32 WITH ALVEOLOPLASTY AND BILATERAL MAXILLARY LATERAL EXOSTOSES REDUCTIONS;  Surgeon: Lenn Cal, DDS;  Location: [place];  Service: Oral Surgery;  Laterality: N/A;  . PATCH ANGIOPLASTY Right 03/10/2016   Procedure: PATCH ANGIOPLASTY CAROTID RIGHT USING Rueben Bash BIOLOGIC PATCH;  Surgeon: Serafina Mitchell, MD;  Location: [place];  Service: Vascular;  Laterality: Right;  . TEE WITHOUT CARDIOVERSION N/A 04/10/2013   Procedure: TRANSESOPHAGEAL ECHOCARDIOGRAM (TEE);  Surgeon: [name], MD;  Location[facility] END[facility]COPY;  Service: Cardiovascular;  Laterality: N/A;  . TEE WITHOUT CARDIOVERSION N/A 03/24/2016   Procedure: TRANSESOPHAGEAL ECHOCARDIOGRAM (TEE);  Surgeon: Jerline Pain, MD;  Location: [place];  Service: Cardiovascular;  Laterality: N/A;  . TEE WITHOUT CARDIOVERSION N/A 05/28/2016   Procedure: TRANSESOPHAGEAL ECHOCARDIOGRAM (TEE);  Surgeon: Rexene Alberts, MD;  Location: [place];  Service: Open Heart Surgery;  Laterality: N/A;  . TUBAL LIGATION      Allergies as of 06/11/2016      Reactions   No Known Allergies       Medication List       Accurate as of 06/11/16  1:37 PM. Always use your most recent med list.          amiodarone 200 MG tablet Commonly known as:  PACERONE Take 1 tablet (200 mg total) by mouth 2 (two) times daily.   aspirin 81 MG EC tablet Take 1 tablet (81 mg total) by mouth daily.   atorvastatin 80 MG tablet Commonly known as:  LIPITOR TAKE ONE TABLET BY MOUTH ONCE DAILY AT 6 PM   chlorhexidine 0.12 % solution Commonly known as:  PERIDEX Rinse with 15 mLs bid after breakfast and at bedtime.  Use in a swish and spit manner.   docusate sodium 100 MG capsule Commonly known as:  COLACE Take 1 capsule (100 mg total) by mouth 2 (two) times daily.   feeding supplement Liqd Take 1 Container by mouth 2 (two) times daily between meals.   lisinopril 10 MG tablet Commonly known as:  PRINIVIL,ZESTRIL Take 1  tablet (10 mg total) by mouth daily.   oseltamivir 75 MG capsule Commonly known as:  TAMIFLU Take 75 mg by mouth daily.   polyethylene glycol packet Commonly known as:  MIRALAX / GLYCOLAX Take 17 g by mouth daily.   traMADol 50 MG tablet Commonly known as:  ULTRAM Take 50-100 mg by mouth every 4 (four) hours as needed for moderate pain or severe pain. Take 1 tablet (50 mg) for moderate pain and 2 tablets (100 mg) for severe pain.   warfarin 2.5 MG tablet Commonly known as:  COUMADIN Take 1 tablet (2.5 mg total) by mouth daily at 6 PM.       No orders of the defined types were placed in this encounter.   Immunization History  Administered Date(s) Administered  . Influenza,inj,Quad PF,36+ Mos 04/02/2013, 02/13/2014  . Tdap 05/10/2009, 08/09/2011    Social History  Substance Use Topics  . Smoking status: Former Smoker    Packs/day: 1.00  Years: 5.00    Types: Cigarettes    Start date: 07/15/1970    Quit date: 05/11/1975  . Smokeless tobacco: Never Used     Comment: quit smoking 36 years ago  . Alcohol use No     Comment: drank years ago when young    Review of Systems  DATA OBTAINED: from patient, nurse GENERAL:  no fevers, fatigue, appetite changes SKIN: dehissed vein graft site HEENT: No complaint RESPIRATORY: No cough, wheezing, SOB CARDIAC: No chest pain, palpitations, lower extremity edema  GI: No abdominal pain, No N/V/D or constipation, No heartburn or reflux  GU: No dysuria, frequency or urgency, or incontinence  MUSCULOSKELETAL: No unrelieved bone/joint pain NEUROLOGIC: No headache, dizziness  PSYCHIATRIC: No overt anxiety or sadness  Vitals:   06/11/16 1334  BP: 139/73  Pulse: 77  Resp: 18  Temp: 98.1 F (36.7 C)   Body mass index is 27.8 kg/m. Physical Exam  GENERAL APPEARANCE: Alert, conversant, No acute distress  SKIN: R thigh  -  There is no redness or heat to area,;dark blood is expressed from both ends, but no pus HEENT:  Unremarkable RESPIRATORY: Breathing is even, unlabored. Lung sounds are clear   CARDIOVASCULAR: Heart RRR no murmurs, rubs or gallops. + peripheral edema RLE >> LLE with calf tenderness GASTROINTESTINAL: Abdomen is soft, non-tender, not distended w/ normal bowel sounds.  GENITOURINARY: Bladder non tender, not distended  MUSCULOSKELETAL: No abnormal joints or musculature NEUROLOGIC: Cranial nerves 2-12 grossly intact. Moves all extremities PSYCHIATRIC: Mood and affect appropriate to situation, no behavioral issues  Patient Active Problem List   Diagnosis Date Noted  . Postoperative anemia due to acute blood loss 06/08/2016  . S/P mitral valve replacement with mechanical valve + CABG x1 + Maze procedure 05/28/2016  . S/P CABG x 1 05/28/2016  . S/P Maze operation for atrial fibrillation 05/28/2016  . Rheumatic mitral regurgitation   . Chronic diastolic CHF (congestive heart failure) ([place])   . Coronary artery disease involving native coronary artery without angina pectoris 03/26/2016  . Second degree Mobitz I AV block 03/25/2016  . Syncope   . Tongue deviation   . Altered mental status 03/23/2016  . Transient alteration of awareness 03/21/2016  . Constipation 03/19/2016  . Asymptomatic stenosis of right carotid artery s/p right CEA 03/10/16 03/10/2016  . Carotid stenosis 04/09/2014  . Cerebral infarction due to embolism of left middle cerebral artery ([place]) 04/09/2014  . HLD (hyperlipidemia) 04/09/2014  . Valvular vegetation 04/09/2014  . Urinary urgency 12/03/2013  . Encounter for therapeutic drug monitoring 06/04/2013  . Paroxysmal atrial fibrillation ([place]) 04/19/2013  . Cardiac device in situ 04/19/2013  . Endocarditis 04/13/2013  . History of CVA (cerebrovascular accident) 04/12/2013  . Pulmonary hypertension 04/06/2013  . S/P balloon mitral valvuloplasty 2008   . Mitral valve stenosis, rheumatic 10/25/2012  . HTN (hypertension) 08/30/2012  . History of rheumatic fever 08/30/2012     CMP     Component Value Date/Time   NA 137 06/04/2016 0326   K 3.7 06/04/2016 0326   CL 105 06/04/2016 0326   CO2 23 06/04/2016 0326   GLUCOSE 109 (H) 06/04/2016 0326   BUN 14 06/04/2016 0326   CREATININE 0.68 06/04/2016 0326   CREATININE 0.98 01/30/2016 1410   CALCIUM 8.8 (L) 06/04/2016 0326   PROT 6.9 06/01/2016 0315   ALBUMIN 3.3 (L) 06/01/2016 0315   AST 63 (H) 06/01/2016 0315   ALT 43 06/01/2016 0315   ALKPHOS 56 06/01/2016 0315   BILITOT 2.9 (  H) 06/01/2016 0315   GFRNONAA >60 06/04/2016 0326   GFRNONAA 64 12/17/2013 1607   GFRAA >60 06/04/2016 0326   GFRAA 74 12/17/2013 1607    Recent Labs  03/19/16 1028  05/28/16 2230 05/29/16 0359 05/29/16 1717  06/02/16 0315 06/03/16 0833 06/04/16 0326  NA  --   < > 143  146* 145 147*  < > 140 138 137  K  --   < > 3.7  3.6 4.1 4.3  < > 4.2 3.8 3.7  CL  --   < > 110  109 117* 112*  < > 106 106 105  CO2  --   < > 21* 23  --   < > 25 26 23   GLUCOSE  --   < > 213*  215* 127* 139*  < > 123* 200* 109*  BUN  --   < > 6  5* 5* 10  < > 24* 21* 14  CREATININE  --   < > 0.78  0.60 0.82 1.16*  1.10*  < > 0.88 0.95 0.68  CALCIUM  --   < > 8.3* 8.4*  --   < > 9.0 8.8* 8.8*  MG  --   --  2.7* 2.4 2.3  --   --   --   --   PHOS 3.7  --   --   --   --   --   --   --   --   < > = values in this interval not displayed.  Recent Labs  03/27/16 0258 05/28/16 0659 06/01/16 0315  AST 22 46* 63*  ALT 19 30 43  ALKPHOS 65 70 56  BILITOT 1.0 1.4* 2.9*  PROT 7.8 8.0 6.9  ALBUMIN 3.5 4.2 3.3*    Recent Labs  10/07/15 1642 01/20/16 1539  03/19/16 0318  06/01/16 0315 06/02/16 0315 06/04/16 0326  WBC 8.5 4.1  < > 5.6  < > 14.0* 12.8* 11.0*  NEUTROABS 5,950 1.8  --  3.4  --   --   --   --   HGB 11.7 12.7  < > 11.9*  < > 7.8* 7.9* 10.6*  HCT 36.2 39.4  < > 37.6  < > 24.8* 25.1* 32.9*  MCV 84.0 83.8  < > 84.3  < > 84.6 85.7 86.1  PLT 132* 170.0  < > 183  < > 120* 140* 186  < > = values in this interval not  displayed.  Recent Labs  07/22/15 1444  CHOL 164  LDLCALC 77  TRIG 91.0   No results found for: [facility] Lab Results  Component Value Date   TSH 0.92 01/20/2016   Lab Results  Component Value Date   HGBA1C 5.5 05/28/2016   Lab Results  Component Value Date   CHOL 164 07/22/2015   HDL 68.90 07/22/2015   LDLCALC 77 07/22/2015   TRIG 91.0 07/22/2015   CHOLHDL 2 07/22/2015    Significant Diagnostic Results in last 30 days:  Dg Chest 2 View  Result Date: 05/28/2016 CLINICAL DATA:  Atrial fibrillation.  Mitral stenosis EXAM: CHEST  2 VIEW COMPARISON:  March 26, 2016 FINDINGS: There is no edema or consolidation. Heart is mildly enlarged with pulmonary venous hypertension. Cardiac enlargement appears to primarily involving the left atrium and right ventricle. There is aortic atherosclerosis. No adenopathy. No bone lesions. IMPRESSION: No edema or consolidation. Pulmonary venous hypertension with enlargement of the left atrium and right ventricle. These are changes consistent with mitral stenosis.  There is aortic atherosclerosis. Electronically Signed   By: Lowella Grip III M.D.   On: 05/28/2016 06:52   Dg Chest Port 1 View  Result Date: 05/31/2016 CLINICAL DATA:  MVR. EXAM: PORTABLE CHEST 1 VIEW COMPARISON:  05/30/2016 . FINDINGS: Left IJ sheath in stable position. Interim removal of bilateral chest tubes and mediastinal drainage catheter. Prior CABG. Prior mitral valve replacement. Left atrial appendage clip again noted. Stable cardiomegaly. Low lung volumes with basilar atelectasis. Small bilateral pleural effusions noted. No pneumothorax. IMPRESSION: 1. Left IJ sheath in stable position. Interim removal of bilateral chest tubes and mediastinal drainage catheter. No pneumothorax. 2. Prior CABG. Prior cardiac valve replacement. Left atrial appendage clip noted stable position. Stable cardiomegaly. No pulmonary venous congestion. 3. Low lung volumes with bibasilar atelectasis  again noted. Small bilateral pleural effusions again noted. Electronically Signed   By: Marcello Moores  Register   On: 05/31/2016 07:42   Dg Chest Port 1 View  Result Date: 05/30/2016 CLINICAL DATA:  Atelectasis. EXAM: PORTABLE CHEST 1 VIEW COMPARISON:  May 29, 2016 FINDINGS: The PA catheter has been removed with only a left IJ sheath remaining. The chest tubes and mediastinal drain are stable. No pneumothorax. Small bilateral effusions and underlying atelectasis are unchanged. No overt edema or other change. IMPRESSION: 1. Removal of PA catheter.  The support apparatus is stable. 2. Small effusions and underlying atelectasis in the bases are unchanged. Electronically Signed   By: Dorise Bullion III M.D   On: 05/30/2016 08:18   Dg Chest Port 1 View  Result Date: 05/29/2016 CLINICAL DATA:  Atelectasis. EXAM: PORTABLE CHEST 1 VIEW COMPARISON:  May 28, 2016 FINDINGS: The left apex was not completely included on the study. Within this limitation, no pneumothorax. The ET and NG tubes have been removed. Other support apparatus is stable. Mild bibasilar atelectasis. No overt edema. No other acute abnormalities or changes. IMPRESSION: 1. Removal of ET tube. Other support apparatus stable. Mild bibasilar atelectasis. Electronically Signed   By: Dorise Bullion III M.D   On: 05/29/2016 08:25   Dg Chest Port 1 View  Result Date: 05/28/2016 CLINICAL DATA:  Thoracic surgery EXAM: PORTABLE CHEST 1 VIEW COMPARISON:  05/28/2016 630 hours FINDINGS: Endotracheal tube tip is 4.3 cm from the carina. NG tube is in the fundus of the stomach. Left jugular Swan-Ganz catheter tip is in the distal main pulmonary artery. Multiple low chest and mediastinal tubes are seen bilaterally. Left atrial appendage clip is in place. Mitral valve hardware replacement is in place. No pneumothorax. Cardiomegaly. No pneumothorax. No sign of pulmonary edema. Minimal basilar atelectasis. IMPRESSION: Support apparatus is in appropriate position.  No pneumothorax or pulmonary edema. Electronically Signed   By: Marybelle Killings M.D.   On: 05/28/2016 17:20    Assessment and Plan  DEHISCENCE OF VEIN GRAFT -  No gaping wounds, heat or pus; as long as wound can drain it should be OK  SWELLING AND PAIN RLE - have ordered RLE U/S    Webb Silversmith D. Marie Coil, MD

## 2016-06-12 ENCOUNTER — Encounter: Payer: Self-pay | Admitting: Internal Medicine

## 2016-06-15 ENCOUNTER — Encounter: Payer: Self-pay | Admitting: Physician Assistant

## 2016-06-15 ENCOUNTER — Telehealth: Payer: Self-pay | Admitting: Cardiology

## 2016-06-15 NOTE — Telephone Encounter (Signed)
Please call,question about pt's appointments.She have 2 appointments does she need both of them?

## 2016-06-15 NOTE — Telephone Encounter (Signed)
Spoke with pt, appointment for Friday this week with the APP cancelled. She will follow up with dr [name] in [place] 06-23-16.

## 2016-06-15 NOTE — Telephone Encounter (Signed)
This encounter was created in error - please disregard.

## 2016-06-17 NOTE — Progress Notes (Signed)
HPI: FU MS and atrial fibrillation. Patient has a history of rheumatic fever/MS. Patient had valvuloplasty at [facility] in 2008. Abdominal ultrasound in April of 2014 normal. Prior ILR revealed paroxysmal atrial fibrillation and anticoagulation was initiated. Patient had carotid endarterectomy November 2017. Admitted with syncope that same month. Had Mobitz type I second-degree AV block when in sinus rhythm but rate controlled when in atrial fibrillation. CTA November 2017 following CEA showed prior right carotid endarterectomy and no hemodynamically significant stenosis of the left carotid. TEE November 2017 showed normal LV function, moderate to severe mitral stenosis, mild left atrial enlargement. Cardiac catheterization November 2017 showed normal LV systolic function, moderate mitral stenosis with mean gradient 10 mmHg, 100% right coronary artery with left-to-right collaterals and moderate pulmonary hypertension. Pt had mechanical MVR, CABG (SVG to PDA) and Maze 1/18. Since last seen, patient denies dyspnea, chest pain, palpitations or syncope. Minimal pedal edema.  Current Outpatient Prescriptions  Medication Sig Dispense Refill  . amiodarone (PACERONE) 200 MG tablet Take 1 tablet (200 mg total) by mouth 2 (two) times daily. 60 tablet 0  . aspirin EC 81 MG EC tablet Take 1 tablet (81 mg total) by mouth daily.    [name] Kitchen atorvastatin (LIPITOR) 80 MG tablet TAKE ONE TABLET BY MOUTH ONCE DAILY AT 6 PM 30 tablet 5  . chlorhexidine (PERIDEX) 0.12 % solution Rinse with 15 mLs bid after breakfast and at bedtime.  Use in a swish and spit manner. 480 mL prn  . docusate sodium (COLACE) 100 MG capsule Take 1 capsule (100 mg total) by mouth 2 (two) times daily. 10 capsule 0  . feeding supplement (BOOST / RESOURCE BREEZE) LIQD Take 1 Container by mouth 2 (two) times daily between meals.    . metoprolol succinate (TOPROL-XL) 25 MG 24 hr tablet Take 25 mg by mouth daily.    . polyethylene glycol (MIRALAX /  GLYCOLAX) packet Take 17 g by mouth daily. 14 each 0  . traMADol (ULTRAM) 50 MG tablet Take 50-100 mg by mouth every 4 (four) hours as needed for moderate pain or severe pain. Take 1 tablet (50 mg) for moderate pain and 2 tablets (100 mg) for severe pain.    [name] Kitchen warfarin (COUMADIN) 2.5 MG tablet Take 1 tablet (2.5 mg total) by mouth daily at 6 PM.     No current facility-administered medications for this visit.      Past Medical History:  Diagnosis Date  . Atrial fibrillation ([place])   . Chronic diastolic CHF (congestive heart failure) ([place])   . Complication of anesthesia    difficult to wake up from anesthesia  . Coronary artery disease involving native coronary artery without angina pectoris 03/26/2016   100% chronic occlusion of RCA  . Dyspnea   . Heart murmur   . History of rheumatic fever    as a child  . Hyperlipidemia   . Hypertension   . Mitral stenosis   . OSA (obstructive sleep apnea)    mild per sleep study 2008  . Paroxysmal atrial fibrillation ([facility])   . Pneumonia    double pneumonia once  . Pulmonary hypertension   . Rheumatic mitral regurgitation   . S/P balloon mitral valvuloplasty 2008   . 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 S/P mitral valve replacement with mechanical valve 05/28/2016   29 mm Sorin Carbomedics Optiform bileaflet mechanical prosthetic valve  . Stroke [facility])    x2  Past Surgical History:  Procedure Laterality Date  . APPENDECTOMY  1989   appendix ruptured,had peritonitis  . BALLOON VALVULOPLASTY  2008   [facility]  . CARDIAC CATHETERIZATION N/A 03/25/2016   Procedure: Right/Left Heart Cath and Coronary Angiography;  Surgeon: Jettie Booze, MD;  Location: [place] CV LAB;  Service: Cardiovascular;  Laterality: N/A;  . COLONOSCOPY W/ POLYPECTOMY    . CORONARY ARTERY BYPASS GRAFT N/A 05/28/2016   Procedure: CORONARY ARTERY BYPASS GRAFTING (CABG) x 1 WITH ENDOSCOPIC HARVESTING OF RIGHT SAPHENOUS VEIN, EVH- PDA;   Surgeon: Rexene Alberts, MD;  Location: [place];  Service: Open Heart Surgery;  Laterality: N/A;  . ENDARTERECTOMY Right 03/10/2016   Procedure: ENDARTERECTOMY CAROTID RIGHT;  Surgeon: Serafina Mitchell, MD;  Location: [place];  Service: Vascular;  Laterality: Right;  . EP IMPLANTABLE DEVICE N/A 03/11/2016   Procedure: Loop Recorder Removal;  Surgeon: Deboraha Sprang, MD;  Location: [place] CV LAB;  Service: Cardiovascular;  Laterality: N/A;  . 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 LOOP RECORDER IMPLANT N/A 04/10/2013   Procedure: LOOP RECORDER IMPLANT;  Surgeon: Coralyn Mark, MD;  Location: [place] CATH LAB;  Service: Cardiovascular;  Laterality: N/A;  . MAZE N/A 05/28/2016   Procedure: MAZE PROCEDURE AND APPLICATION OF  ATRICLIP LAA PROCLIP II 72 MM;  Surgeon: Rexene Alberts, MD;  Location: [place];  Service: Open Heart Surgery;  Laterality: N/A;  . MITRAL VALVE REPLACEMENT N/A 05/28/2016   Procedure: MITRAL VALVE (MV) REPLACEMENT USING 29 MM CARBOMEDICS OPTIFORM MECHANICAL BILEAFLET PROSTHETIC HEART VALVE;  Surgeon: Rexene Alberts, MD;  Location: [place];  Service: Open Heart Surgery;  Laterality: N/A;  . MULTIPLE EXTRACTIONS WITH ALVEOLOPLASTY N/A 03/30/2016   Procedure: EXTRACTION OF TOOTH #'S 2,5,6,11,15,16,20-30 AND 32 WITH ALVEOLOPLASTY AND BILATERAL MAXILLARY LATERAL EXOSTOSES REDUCTIONS;  Surgeon: [place] Cal, DDS;  Location: [place];  Service: Oral Surgery;  Laterality: N/A;  . PATCH ANGIOPLASTY Right 03/10/2016   Procedure: PATCH ANGIOPLASTY CAROTID RIGHT USING Rueben Bash BIOLOGIC PATCH;  Surgeon: Serafina Mitchell, MD;  Location: [place];  Service: Vascular;  Laterality: Right;  . TEE WITHOUT CARDIOVERSION N/A 04/10/2013   Procedure: TRANSESOPHAGEAL ECHOCARDIOGRAM (TEE);  Surgeon: [name], MD;  Location: [facility] ENDOSCOPY;  Service: Cardiovascular;  Laterality: N/A;  . TEE WITHOUT CARDIOVERSION N/A 03/24/2016   Procedure: TRANSESOPHAGEAL  ECHOCARDIOGRAM (TEE);  Surgeon: Jerline Pain, MD;  Location: [place];  Service: Cardiovascular;  Laterality: N/A;  . TEE WITHOUT CARDIOVERSION N/A 05/28/2016   Procedure: TRANSESOPHAGEAL ECHOCARDIOGRAM (TEE);  Surgeon: Rexene Alberts, MD;  Location: [place];  Service: Open Heart Surgery;  Laterality: N/A;  . TUBAL LIGATION      Social History   Social History  . Marital status: Married    Spouse name: N/A  . Number of children: 2  . Years of education: Associates   Occupational History  . Not on file.   Social History Main Topics  . Smoking status: Former Smoker    Packs/day: 1.00    Years: 5.00    Types: Cigarettes    Start date: 07/15/1970    Quit date: 05/11/1975  . Smokeless tobacco: Never Used     Comment: quit smoking 36 years ago  . Alcohol use No     Comment: drank years ago when young  . Drug use: No  . Sexual activity: Not on file   Other Topics Concern  .  Not on file   Social History Narrative   Retired Archivist   Married   She has 2 grown children-   Daughter lives in [place]   Son lives in [place]   6 grandchildren        Family History  Problem Relation Age of Onset  . Diabetes Mother   . Stroke Mother   . Diabetes Father   . Heart disease Father     CHF  . Cancer Father     kidney  . Diabetes Sister   . Diabetes Brother   . Hypertension Daughter     ROS: no fevers or chills, productive cough, hemoptysis, dysphasia, odynophagia, melena, hematochezia, dysuria, hematuria, rash, seizure activity, orthopnea, PND, claudication. Remaining systems are negative.  Physical Exam: Well-developed well-nourished in no acute distress.  Skin is warm and dry.  HEENT is normal.  Neck is supple.  Chest is clear to auscultation with normal expansion. Status post sternotomy with no evidence of infection. Crisp mechanical valve sound Cardiovascular exam is regular rate and rhythm.  Abdominal exam nontender or distended. No masses  palpated. Extremities show Trace to 1 + edema. neuro grossly intact  ECG-Probable sinus rhythm with nonspecific ST changes.  A/P  1 status post mitral valve replacement-continue Coumadin and ASA. Continue SBE prophylaxis. We will arrange a baseline echocardiogram status post MVR in 8 weeks.  2 paroxysmal atrial fibrillation/flutter-continue metoprolol and Coumadin. Decrease amiodarone to 200 mg daily. I will see her back in 8 weeks to see if she is in sinus rhythm. If so we could likely discontinue amiodarone at that point.  3 hypertension-blood pressure controlled. Continue present medications.  4 hyperlipidemia-continue statin.  5 coronary artery disease-continue aspirin and statin.  6 carotid artery disease-status post carotid endarterectomy-continue statin. She will need follow-up carotid Dopplers in the future.    [name], MD

## 2016-06-18 ENCOUNTER — Ambulatory Visit: Payer: Self-pay | Admitting: Physician Assistant

## 2016-06-22 ENCOUNTER — Non-Acute Institutional Stay (SKILLED_NURSING_FACILITY): Payer: BC Managed Care – PPO | Admitting: Internal Medicine

## 2016-06-22 ENCOUNTER — Encounter: Payer: Self-pay | Admitting: Internal Medicine

## 2016-06-22 DIAGNOSIS — T464X5A Adverse effect of angiotensin-converting-enzyme inhibitors, initial encounter: Secondary | ICD-10-CM

## 2016-06-22 DIAGNOSIS — R05 Cough: Secondary | ICD-10-CM | POA: Diagnosis not present

## 2016-06-22 NOTE — Progress Notes (Signed)
Location:  [facility] Room Number: 443X Place of Service:  SNF (31)  Marie Delaine. Malone Coil, MD  Patient Care Team: Debbrah Alar, NP as PCP - General (Internal Medicine) Deneise Lever, MD as Consulting Physician (Pulmonary Disease) Collene Gobble, MD as Consulting Physician (Pulmonary Disease) Deboraha Sprang, MD as Consulting Physician (Cardiology) [name], MD as Consulting Physician (Cardiology) Lorretta Harp, MD as Consulting Physician (Cardiology)  Extended Emergency Contact Information Primary Emergency Contact: [place] of [place] Phone: [phone] Relation: None Secondary Emergency Contact: [place] of [place] Phone: [phone] Relation: Daughter    Allergies: No known allergies  Chief Complaint  Patient presents with  . Acute Visit    Acute    HPI: Patient is 66 y.o. female who nursing asked me to see because pt has had a cough since she came to SNF. Cough is dry, occurs all throughout the day and night, nothing makes it better or worse. No fever or other resp sx, upper or lower.  Past Medical History:  Diagnosis Date  . Atrial fibrillation ([place])   . Chronic diastolic CHF (congestive heart failure) ([place])   . Complication of anesthesia    difficult to wake up from anesthesia  . Coronary artery disease involving native coronary artery without angina pectoris 03/26/2016   100% chronic occlusion of RCA  . Dyspnea   . Heart murmur   . History of rheumatic fever    as a child  . Hyperlipidemia   . Hypertension   . Mitral stenosis   . OSA (obstructive sleep apnea)    mild per sleep study 2008  . Paroxysmal atrial fibrillation ([facility])   . Pneumonia    double pneumonia once  . Pulmonary hypertension   . Rheumatic mitral regurgitation   . S/P balloon mitral valvuloplasty 2008   . 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 S/P mitral valve  replacement with mechanical valve 05/28/2016   29 mm Sorin Carbomedics Optiform bileaflet mechanical prosthetic valve  . Stroke [facility])    x2    Past Surgical History:  Procedure Laterality Date  . APPENDECTOMY  1989   appendix ruptured,had peritonitis  . BALLOON VALVULOPLASTY  2008   [facility]  . CARDIAC CATHETERIZATION N/A 03/25/2016   Procedure: Right/Left Heart Cath and Coronary Angiography;  Surgeon: Jettie Booze, MD;  Location: [place] CV LAB;  Service: Cardiovascular;  Laterality: N/A;  . COLONOSCOPY W/ POLYPECTOMY    . CORONARY ARTERY BYPASS GRAFT N/A 05/28/2016   Procedure: CORONARY ARTERY BYPASS GRAFTING (CABG) x 1 WITH ENDOSCOPIC HARVESTING OF RIGHT SAPHENOUS VEIN, EVH- PDA;  Surgeon: Rexene Alberts, MD;  Location: [place];  Service: Open Heart Surgery;  Laterality: N/A;  . ENDARTERECTOMY Right 03/10/2016   Procedure: ENDARTERECTOMY CAROTID RIGHT;  Surgeon: Serafina Mitchell, MD;  Location: [place];  Service: Vascular;  Laterality: Right;  . EP IMPLANTABLE DEVICE N/A 03/11/2016   Procedure: Loop Recorder Removal;  Surgeon: Deboraha Sprang, MD;  Location: [place] CV LAB;  Service: Cardiovascular;  Laterality: N/A;  . 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 LOOP RECORDER IMPLANT N/A 04/10/2013   Procedure: LOOP RECORDER IMPLANT;  Surgeon: Coralyn Mark, MD;  Location: [place] CATH LAB;  Service: Cardiovascular;  Laterality: N/A;  . MAZE N/A 05/28/2016   Procedure: MAZE  PROCEDURE AND APPLICATION OF  ATRICLIP LAA PROCLIP II 56 MM;  Surgeon: Rexene Alberts, MD;  Location: [place];  Service: Open Heart Surgery;  Laterality: N/A;  . MITRAL VALVE REPLACEMENT N/A 05/28/2016   Procedure: MITRAL VALVE (MV) REPLACEMENT USING 29 MM CARBOMEDICS OPTIFORM MECHANICAL BILEAFLET PROSTHETIC HEART VALVE;  Surgeon: Rexene Alberts, MD;  Location: [place];  Service: Open Heart Surgery;  Laterality: N/A;  . MULTIPLE EXTRACTIONS WITH ALVEOLOPLASTY N/A  03/30/2016   Procedure: EXTRACTION OF TOOTH #'S 2,5,6,11,15,16,20-30 AND 32 WITH ALVEOLOPLASTY AND BILATERAL MAXILLARY LATERAL EXOSTOSES REDUCTIONS;  Surgeon: [place] Cal, DDS;  Location: [place];  Service: Oral Surgery;  Laterality: N/A;  . PATCH ANGIOPLASTY Right 03/10/2016   Procedure: PATCH ANGIOPLASTY CAROTID RIGHT USING Rueben Bash BIOLOGIC PATCH;  Surgeon: Serafina Mitchell, MD;  Location: [place];  Service: Vascular;  Laterality: Right;  . TEE WITHOUT CARDIOVERSION N/A 04/10/2013   Procedure: TRANSESOPHAGEAL ECHOCARDIOGRAM (TEE);  Surgeon: [name], MD;  Location: [facility] ENDOSCOPY;  Service: Cardiovascular;  Laterality: N/A;  . TEE WITHOUT CARDIOVERSION N/A 03/24/2016   Procedure: TRANSESOPHAGEAL ECHOCARDIOGRAM (TEE);  Surgeon: Jerline Pain, MD;  Location: [place];  Service: Cardiovascular;  Laterality: N/A;  . TEE WITHOUT CARDIOVERSION N/A 05/28/2016   Procedure: TRANSESOPHAGEAL ECHOCARDIOGRAM (TEE);  Surgeon: Rexene Alberts, MD;  Location: [place];  Service: Open Heart Surgery;  Laterality: N/A;  . TUBAL LIGATION      Allergies as of 06/22/2016      Reactions   No Known Allergies       Medication List       Accurate as of 06/22/16 11:59 PM. Always use your most recent med list.          amiodarone 200 MG tablet Commonly known as:  PACERONE Take 1 tablet (200 mg total) by mouth 2 (two) times daily.   aspirin 81 MG EC tablet Take 1 tablet (81 mg total) by mouth daily.   atorvastatin 80 MG tablet Commonly known as:  LIPITOR TAKE ONE TABLET BY MOUTH ONCE DAILY AT 6 PM   chlorhexidine 0.12 % solution Commonly known as:  PERIDEX Rinse with 15 mLs bid after breakfast and at bedtime.  Use in a swish and spit manner.   docusate sodium 100 MG capsule Commonly known as:  COLACE Take 1 capsule (100 mg total) by mouth 2 (two) times daily.   feeding supplement Liqd Take 1 Container by mouth 2 (two) times daily between meals.   lisinopril 10 MG tablet Commonly known as:   PRINIVIL,ZESTRIL Take 1 tablet (10 mg total) by mouth daily.   oseltamivir 75 MG capsule Commonly known as:  TAMIFLU Take 75 mg by mouth daily.   polyethylene glycol packet Commonly known as:  MIRALAX / GLYCOLAX Take 17 g by mouth daily.   traMADol 50 MG tablet Commonly known as:  ULTRAM Take 50-100 mg by mouth every 4 (four) hours as needed for moderate pain or severe pain. Take 1 tablet (50 mg) for moderate pain and 2 tablets (100 mg) for severe pain.   warfarin 2.5 MG tablet Commonly known as:  COUMADIN Take 1 tablet (2.5 mg total) by mouth daily at 6 PM.       No orders of the defined types were placed in this encounter.   Immunization History  Administered Date(s) Administered  . Influenza,inj,Quad PF,36+ Mos 04/02/2013, 02/13/2014  . PPD Test 06/04/2016  . Tdap 05/10/2009, 08/09/2011    Social History  Substance Use Topics  .  Smoking status: Former Smoker    Packs/day: 1.00    Years: 5.00    Types: Cigarettes    Start date: 07/15/1970    Quit date: 05/11/1975  . Smokeless tobacco: Never Used     Comment: quit smoking 36 years ago  . Alcohol use No     Comment: drank years ago when young    Review of Systems  DATA OBTAINED: from patient GENERAL:  no fevers, fatigue, appetite changes SKIN: No itching, rash HEENT: No complaint RESPIRATORY: + cough as per HPI, no wheezing, no SOB CARDIAC: No chest pain, palpitations, lower extremity edema  GI: No abdominal pain, No N/V/D or constipation, No heartburn or reflux  GU: No dysuria, frequency or urgency, or incontinence  MUSCULOSKELETAL: No unrelieved bone/joint pain NEUROLOGIC: No headache, dizziness  PSYCHIATRIC: No overt anxiety or sadness  Vitals:   06/22/16 1551  BP: 131/85  Pulse: (!) 111  Resp: 20  Temp: 98.2 F (36.8 C)   Body mass index is 28.86 kg/m. Physical Exam  GENERAL APPEARANCE: Alert, conversant, No acute distress  SKIN: No diaphoresis rash HEENT: Unremarkable RESPIRATORY: Breathing  is even, unlabored. Lung sounds are clear   CARDIOVASCULAR: Heart RRR no murmurs, rubs or gallops. No peripheral edema  GASTROINTESTINAL: Abdomen is soft, non-tender, not distended w/ normal bowel sounds.  GENITOURINARY: Bladder non tender, not distended  MUSCULOSKELETAL: No abnormal joints or musculature NEUROLOGIC: Cranial nerves 2-12 grossly intact. Moves all extremities PSYCHIATRIC: Mood and affect appropriate to situation, no behavioral issues  Patient Active Problem List   Diagnosis Date Noted  . HLD (hyperlipidemia) 06/25/2016  . Postoperative anemia due to acute blood loss 06/08/2016  . S/P mitral valve replacement with mechanical valve + CABG x1 + Maze procedure 05/28/2016  . S/P CABG x 1 05/28/2016  . S/P Maze operation for atrial fibrillation 05/28/2016  . Rheumatic mitral regurgitation   . Chronic diastolic CHF (congestive heart failure) ([place])   . Coronary artery disease involving native coronary artery without angina pectoris 03/26/2016  . Second degree Mobitz I AV block 03/25/2016  . Syncope   . Tongue deviation   . Altered mental status 03/23/2016  . Transient alteration of awareness 03/21/2016  . Constipation 03/19/2016  . Asymptomatic stenosis of right carotid artery s/p right CEA 03/10/16 03/10/2016  . Carotid stenosis 04/09/2014  . Cerebral infarction due to embolism of left middle cerebral artery ([place]) 04/09/2014  . Valvular vegetation 04/09/2014  . Urinary urgency 12/03/2013  . Encounter for therapeutic drug monitoring 06/04/2013  . Paroxysmal atrial fibrillation ([place]) 04/19/2013  . Cardiac device in situ 04/19/2013  . Endocarditis 04/13/2013  . History of CVA (cerebrovascular accident) 04/12/2013  . Pulmonary hypertension 04/06/2013  . S/P balloon mitral valvuloplasty 2008   . Mitral valve stenosis, rheumatic 10/25/2012  . HTN (hypertension) 08/30/2012  . History of rheumatic fever 08/30/2012    CMP     Component Value Date/Time   NA 137 06/04/2016  0326   NA 137 06/04/2016   K 3.7 06/04/2016 0326   CL 105 06/04/2016 0326   CO2 23 06/04/2016 0326   GLUCOSE 109 (H) 06/04/2016 0326   BUN 14 06/04/2016 0326   BUN 14 06/04/2016   CREATININE 0.68 06/04/2016 0326   CREATININE 0.98 01/30/2016 1410   CALCIUM 8.8 (L) 06/04/2016 0326   PROT 6.9 06/01/2016 0315   ALBUMIN 3.3 (L) 06/01/2016 0315   AST 63 (H) 06/01/2016 0315   ALT 43 06/01/2016 0315   ALKPHOS 56 06/01/2016 0315  BILITOT 2.9 (H) 06/01/2016 0315   GFRNONAA >60 06/04/2016 0326   GFRNONAA 64 12/17/2013 1607   GFRAA >60 06/04/2016 0326   GFRAA 74 12/17/2013 1607    Recent Labs  03/19/16 1028  05/28/16 2230 05/29/16 0359 05/29/16 1717  06/02/16 0315 06/03/16 06/03/16 0833 06/04/16 06/04/16 0326  NA  --   < > 143  146* 145 147*  < > 140 138 138 137 137  K  --   < > 3.7  3.6 4.1 4.3  < > 4.2 3.8 3.8  --  3.7  CL  --   < > 110  109 117* 112*  < > 106  --  106  --  105  CO2  --   < > 21* 23  --   < > 25  --  26  --  23  GLUCOSE  --   < > 213*  215* 127* 139*  < > 123*  --  200*  --  109*  BUN  --   < > 6  5* 5* 10  < > 24* 21 21* 14 14  CREATININE  --   < > 0.78  0.60 0.82 1.16*  1.10*  < > 0.88 1.0 0.95 0.7 0.68  CALCIUM  --   < > 8.3* 8.4*  --   < > 9.0  --  8.8*  --  8.8*  MG  --   --  2.7* 2.4 2.3  --   --   --   --   --   --   PHOS 3.7  --   --   --   --   --   --   --   --   --   --   < > = values in this interval not displayed.  Recent Labs  03/27/16 0258 05/28/16 0659 06/01/16 0315  AST 22 46* 63*  ALT 19 30 43  ALKPHOS 65 70 56  BILITOT 1.0 1.4* 2.9*  PROT 7.8 8.0 6.9  ALBUMIN 3.5 4.2 3.3*    Recent Labs  10/07/15 1642 01/20/16 1539  03/19/16 0318  06/01/16 0315 06/02/16 06/02/16 0315 06/04/16 06/04/16 0326  WBC 8.5 4.1  < > 5.6  < > 14.0* 12.8 12.8* 11.0 11.0*  NEUTROABS 5,950 1.8  --  3.4  --   --   --   --   --   --   HGB 11.7 12.7  < > 11.9*  < > 7.8* 7.9* 7.9*  --  10.6*  HCT 36.2 39.4  < > 37.6  < > 24.8* 25* 25.1*  --  32.9*    MCV 84.0 83.8  < > 84.3  < > 84.6  --  85.7  --  86.1  PLT 132* 170.0  < > 183  < > 120* 140* 140*  --  186  < > = values in this interval not displayed.  Recent Labs  07/22/15 1444  CHOL 164  LDLCALC 77  TRIG 91.0   No results found for: [facility] Lab Results  Component Value Date   TSH 0.92 01/20/2016   Lab Results  Component Value Date   HGBA1C 5.5 05/28/2016   Lab Results  Component Value Date   CHOL 164 07/22/2015   HDL 68.90 07/22/2015   LDLCALC 77 07/22/2015   TRIG 91.0 07/22/2015   CHOLHDL 2 07/22/2015    Significant Diagnostic Results in last 30 days:  Dg Chest [facility] 1 View  Result  Date: 05/31/2016 CLINICAL DATA:  MVR. EXAM: PORTABLE CHEST 1 VIEW COMPARISON:  05/30/2016 . FINDINGS: Left IJ sheath in stable position. Interim removal of bilateral chest tubes and mediastinal drainage catheter. Prior CABG. Prior mitral valve replacement. Left atrial appendage clip again noted. Stable cardiomegaly. Low lung volumes with basilar atelectasis. Small bilateral pleural effusions noted. No pneumothorax. IMPRESSION: 1. Left IJ sheath in stable position. Interim removal of bilateral chest tubes and mediastinal drainage catheter. No pneumothorax. 2. Prior CABG. Prior cardiac valve replacement. Left atrial appendage clip noted stable position. Stable cardiomegaly. No pulmonary venous congestion. 3. Low lung volumes with bibasilar atelectasis again noted. Small bilateral pleural effusions again noted. Electronically Signed   By: Marcello Moores  Register   On: 05/31/2016 07:42   Dg Chest [place] 1 View  Result Date: 05/30/2016 CLINICAL DATA:  Atelectasis. EXAM: PORTABLE CHEST 1 VIEW COMPARISON:  May 29, 2016 FINDINGS: The PA catheter has been removed with only a left IJ sheath remaining. The chest tubes and mediastinal drain are stable. No pneumothorax. Small bilateral effusions and underlying atelectasis are unchanged. No overt edema or other change. IMPRESSION: 1. Removal of PA catheter.   The support apparatus is stable. 2. Small effusions and underlying atelectasis in the bases are unchanged. Electronically Signed   By: Dorise Bullion III M.D   On: 05/30/2016 08:18   Dg Chest Port 1 View  Result Date: 05/29/2016 CLINICAL DATA:  Atelectasis. EXAM: PORTABLE CHEST 1 VIEW COMPARISON:  May 28, 2016 FINDINGS: The left apex was not completely included on the study. Within this limitation, no pneumothorax. The ET and NG tubes have been removed. Other support apparatus is stable. Mild bibasilar atelectasis. No overt edema. No other acute abnormalities or changes. IMPRESSION: 1. Removal of ET tube. Other support apparatus stable. Mild bibasilar atelectasis. Electronically Signed   By: Dorise Bullion III M.D   On: 05/29/2016 08:25    Assessment and Plan  COUGH 2/2 LISINOPRIL- reviewed records, cough started in hospital and lisinopril was started in hospital; will d/c lisinopril and put herback on her prior BBlocker Toprol XL 25 mg daily;pt with hx chronic dCHF     Time spent > 25 min;> 50% of time with patient was spent reviewing records, labs, tests and studies, counseling and developing plan of care  Webb Silversmith D. Malone Coil, MD

## 2016-06-23 ENCOUNTER — Ambulatory Visit (INDEPENDENT_AMBULATORY_CARE_PROVIDER_SITE_OTHER): Payer: BC Managed Care – PPO | Admitting: Cardiology

## 2016-06-23 ENCOUNTER — Encounter: Payer: Self-pay | Admitting: Cardiology

## 2016-06-23 VITALS — BP 130/83 | HR 99 | Ht 62.0 in | Wt 151.8 lb

## 2016-06-23 DIAGNOSIS — I10 Essential (primary) hypertension: Secondary | ICD-10-CM

## 2016-06-23 DIAGNOSIS — I4891 Unspecified atrial fibrillation: Secondary | ICD-10-CM

## 2016-06-23 DIAGNOSIS — I059 Rheumatic mitral valve disease, unspecified: Secondary | ICD-10-CM | POA: Diagnosis not present

## 2016-06-23 DIAGNOSIS — E7800 Pure hypercholesterolemia, unspecified: Secondary | ICD-10-CM

## 2016-06-23 MED ORDER — AMIODARONE HCL 200 MG PO TABS: 200.0000 mg | ORAL_TABLET | Freq: Every day | ORAL | 0 refills | Status: DC

## 2016-06-23 NOTE — Patient Instructions (Signed)
Medication Instructions:   DECREASE AMIODARONE TO 200 MG ONCE DAILY  Testing/Procedures:  Your physician has requested that you have an echocardiogram. Echocardiography is a painless test that uses sound waves to create images of your heart. It provides your doctor with information about the size and shape of your heart and how well your heart's chambers and valves are working. This procedure takes approximately one hour. There are no restrictions for this procedure.    Follow-Up:  Your physician recommends that you schedule a follow-up appointment in: [place]   Any O

## 2016-06-25 ENCOUNTER — Encounter: Payer: Self-pay | Admitting: Internal Medicine

## 2016-06-25 ENCOUNTER — Non-Acute Institutional Stay: Payer: BC Managed Care – PPO | Admitting: Internal Medicine

## 2016-06-25 DIAGNOSIS — D62 Acute posthemorrhagic anemia: Secondary | ICD-10-CM | POA: Diagnosis not present

## 2016-06-25 DIAGNOSIS — I10 Essential (primary) hypertension: Secondary | ICD-10-CM

## 2016-06-25 DIAGNOSIS — I051 Rheumatic mitral insufficiency: Secondary | ICD-10-CM

## 2016-06-25 DIAGNOSIS — E785 Hyperlipidemia, unspecified: Secondary | ICD-10-CM | POA: Diagnosis not present

## 2016-06-25 DIAGNOSIS — Z9889 Other specified postprocedural states: Secondary | ICD-10-CM | POA: Diagnosis not present

## 2016-06-25 DIAGNOSIS — Z951 Presence of aortocoronary bypass graft: Secondary | ICD-10-CM | POA: Diagnosis not present

## 2016-06-25 DIAGNOSIS — I480 Paroxysmal atrial fibrillation: Secondary | ICD-10-CM

## 2016-06-25 DIAGNOSIS — E7800 Pure hypercholesterolemia, unspecified: Secondary | ICD-10-CM | POA: Diagnosis not present

## 2016-06-25 DIAGNOSIS — I2510 Atherosclerotic heart disease of native coronary artery without angina pectoris: Secondary | ICD-10-CM | POA: Diagnosis not present

## 2016-06-25 DIAGNOSIS — Z954 Presence of other heart-valve replacement: Secondary | ICD-10-CM | POA: Diagnosis not present

## 2016-06-25 DIAGNOSIS — I050 Rheumatic mitral stenosis: Secondary | ICD-10-CM

## 2016-06-25 DIAGNOSIS — Z8679 Personal history of other diseases of the circulatory system: Secondary | ICD-10-CM

## 2016-06-25 DIAGNOSIS — I5032 Chronic diastolic (congestive) heart failure: Secondary | ICD-10-CM

## 2016-06-25 DIAGNOSIS — I6521 Occlusion and stenosis of right carotid artery: Secondary | ICD-10-CM

## 2016-06-25 NOTE — Progress Notes (Signed)
Location:  [place] Room Number: 962X Place of Service:  SNF (31)  Noah Delaine. Sheppard Coil, MD  PCP: [name], NP Patient Care Team: Debbrah Alar, NP as PCP - General (Internal Medicine) Deneise Lever, MD as Consulting Physician (Pulmonary Disease) Collene Gobble, MD as Consulting Physician (Pulmonary Disease) Deboraha Sprang, MD as Consulting Physician (Cardiology) [name], MD as Consulting Physician (Cardiology) Lorretta Harp, MD as Consulting Physician (Cardiology)  Extended Emergency Contact Information Primary Emergency Contact: [place] Phone: [phone] Relation: None Secondary Emergency Contact: [place] Phone: [phone] Relation: Daughter  Allergies  Allergen Reactions  . No Known Allergies     Chief Complaint  Patient presents with  . Discharge Note    Discharged from SNF    HPI:  66 y.o. female  who was admitted to [facility] from 1/19-26 for a planned MV replacement, and one vessel CABG after presenting with a syncopal episode in 04/2016.  Pt had been followed by Dr [name] since 2014 after being followed in [place] for many years for mitral stenosis felt 2/2 rheumatic heart disease.  In her 04/2016 hospitalization stenosis was found to be severe and pt had 100% occlusion of RCA as well and was set up for for replacement of MV with a mechanical valve + CABG X1 + maze procedure which she underwent 05/28/2016. Pt recovered nicely in SICU with expected post-op anemia and thrombocytopenia . She also had some post -op volume overload that has responded with diuretics. Pt was  started on coumadin for her mechanical valve. Pt is admitted to SNF for OT/PT and is now ready to be d/c to home.    Past Medical History:  Diagnosis Date  . Atrial fibrillation ([place])   . Chronic diastolic CHF (congestive heart failure) ([place])   . Complication of  anesthesia    difficult to wake up from anesthesia  . Coronary artery disease involving native coronary artery without angina pectoris 03/26/2016   100% chronic occlusion of RCA  . Dyspnea   . Heart murmur   . History of rheumatic fever    as a child  . Hyperlipidemia   . Hypertension   . Mitral stenosis   . OSA (obstructive sleep apnea)    mild per sleep study 2008  . Paroxysmal atrial fibrillation (H[facility])   . Pneumonia    double pneumonia once  . Pulmonary hypertension   . Rheumatic mitral regurgitation   . S/P balloon mitral valvuloplasty 2008   . 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 S/P mitral valve replacement with mechanical valve 05/28/2016   29 mm Sorin Carbomedics Optiform bileaflet mechanical prosthetic valve  . Stroke [facility])    x2    Past Surgical History:  Procedure Laterality Date  . APPENDECTOMY  1989   appendix ruptured,had peritonitis  . BALLOON VALVULOPLASTY  2008   [facility]  . CARDIAC CATHETERIZATION N/A 03/25/2016   Procedure: Right/Left Heart Cath and Coronary Angiography;  Surgeon: Jettie Booze, MD;  Location: [place] CV LAB;  Service: Cardiovascular;  Laterality: N/A;  . COLONOSCOPY W/ POLYPECTOMY    . CORONARY ARTERY BYPASS GRAFT N/A 05/28/2016   Procedure: CORONARY ARTERY BYPASS GRAFTING (CABG) x 1 WITH ENDOSCOPIC HARVESTING OF RIGHT SAPHENOUS VEIN, EVH- PDA;  Surgeon: Rexene Alberts, MD;  Location: [place];  Service: Open Heart Surgery;  Laterality:  N/A;  . ENDARTERECTOMY Right 03/10/2016   Procedure: ENDARTERECTOMY CAROTID RIGHT;  Surgeon: Serafina Mitchell, MD;  Location: [place];  Service: Vascular;  Laterality: Right;  . EP IMPLANTABLE DEVICE N/A 03/11/2016   Procedure: Loop Recorder Removal;  Surgeon: Deboraha Sprang, MD;  Location: [place] CV LAB;  Service: Cardiovascular;  Laterality: N/A;  . 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 LOOP RECORDER IMPLANT N/A  04/10/2013   Procedure: LOOP RECORDER IMPLANT;  Surgeon: Coralyn Mark, MD;  Location: [place] CATH LAB;  Service: Cardiovascular;  Laterality: N/A;  . MAZE N/A 05/28/2016   Procedure: MAZE PROCEDURE AND APPLICATION OF  ATRICLIP LAA PROCLIP II 38 MM;  Surgeon: Rexene Alberts, MD;  Location: [place];  Service: Open Heart Surgery;  Laterality: N/A;  . MITRAL VALVE REPLACEMENT N/A 05/28/2016   Procedure: MITRAL VALVE (MV) REPLACEMENT USING 29 MM CARBOMEDICS OPTIFORM MECHANICAL BILEAFLET PROSTHETIC HEART VALVE;  Surgeon: Rexene Alberts, MD;  Location: [place];  Service: Open Heart Surgery;  Laterality: N/A;  . MULTIPLE EXTRACTIONS WITH ALVEOLOPLASTY N/A 03/30/2016   Procedure: EXTRACTION OF TOOTH #'S 2,5,6,11,15,16,20-30 AND 32 WITH ALVEOLOPLASTY AND BILATERAL MAXILLARY LATERAL EXOSTOSES REDUCTIONS;  Surgeon: Lenn Cal, DDS;  Location: [place];  Service: Oral Surgery;  Laterality: N/A;  . PATCH ANGIOPLASTY Right 03/10/2016   Procedure: PATCH ANGIOPLASTY CAROTID RIGHT USING Rueben Bash BIOLOGIC PATCH;  Surgeon: Serafina Mitchell, MD;  Location: [place];  Service: Vascular;  Laterality: Right;  . TEE WITHOUT CARDIOVERSION N/A 04/10/2013   Procedure: TRANSESOPHAGEAL ECHOCARDIOGRAM (TEE);  Surgeon: [name], MD;  Location: [facility] ENDOSCOPY;  Service: Cardiovascular;  Laterality: N/A;  . TEE WITHOUT CARDIOVERSION N/A 03/24/2016   Procedure: TRANSESOPHAGEAL ECHOCARDIOGRAM (TEE);  Surgeon: Jerline Pain, MD;  Location: [place];  Service: Cardiovascular;  Laterality: N/A;  . TEE WITHOUT CARDIOVERSION N/A 05/28/2016   Procedure: TRANSESOPHAGEAL ECHOCARDIOGRAM (TEE);  Surgeon: Rexene Alberts, MD;  Location: [place];  Service: Open Heart Surgery;  Laterality: N/A;  . TUBAL LIGATION       reports that she quit smoking about 41 years ago. Her smoking use included Cigarettes. She started smoking about 45 years ago. She has a 5.00 pack-year smoking history. She has never used smokeless tobacco. She reports that she does  not drink alcohol or use drugs. Social History   Social History  . Marital status: Married    Spouse name: N/A  . Number of children: 2  . Years of education: Associates   Occupational History  . Not on file.   Social History Main Topics  . Smoking status: Former Smoker    Packs/day: 1.00    Years: 5.00    Types: Cigarettes    Start date: 07/15/1970    Quit date: 05/11/1975  . Smokeless tobacco: Never Used     Comment: quit smoking 36 years ago  . Alcohol use No     Comment: drank years ago when young  . Drug use: No  . Sexual activity: Not on file   Other Topics Concern  . Not on file   Social History Narrative   Retired Archivist   Married   She has 2 grown children-   Daughter lives in [place]   Son lives in [place]   6 grandchildren        Pertinent  Health Maintenance Due  Topic Date Due  . COLONOSCOPY  12/26/2001  . INFLUENZA  VACCINE  01/19/2017 (Originally 12/09/2015)  . MAMMOGRAM  01/19/2017 (Originally 12/26/1969)  . PAP SMEAR  07/22/2018    Medications: Allergies as of 06/25/2016      Reactions   No Known Allergies       Medication List       Accurate as of 06/25/16  4:47 PM. Always use your most recent med list.          amiodarone 200 MG tablet Commonly known as:  PACERONE Take 1 tablet (200 mg total) by mouth daily.   aspirin 81 MG EC tablet Take 1 tablet (81 mg total) by mouth daily.   atorvastatin 80 MG tablet Commonly known as:  LIPITOR TAKE ONE TABLET BY MOUTH ONCE DAILY AT 6 PM   chlorhexidine 0.12 % solution Commonly known as:  PERIDEX Rinse with 15 mLs bid after breakfast and at bedtime.  Use in a swish and spit manner.   docusate sodium 100 MG capsule Commonly known as:  COLACE Take 1 capsule (100 mg total) by mouth 2 (two) times daily.   feeding supplement Liqd Take 1 Container by mouth 2 (two) times daily between meals.   metoprolol succinate 25 MG 24 hr tablet Commonly known as:  TOPROL-XL Take 25 mg  by mouth daily.   polyethylene glycol packet Commonly known as:  MIRALAX / GLYCOLAX Take 17 g by mouth daily.   traMADol 50 MG tablet Commonly known as:  ULTRAM Take 50-100 mg by mouth every 4 (four) hours as needed for moderate pain or severe pain. Take 1 tablet (50 mg) for moderate pain and 2 tablets (100 mg) for severe pain.   warfarin 4 MG tablet Commonly known as:  COUMADIN Take 4 mg by mouth daily.        Vitals:   06/25/16 0845  BP: (!) 150/89  Pulse: 97  Resp: 20  Temp: 98.1 F (36.7 C)  SpO2: 96%  Weight: 157 lb 12.8 oz (71.6 kg)  Height: 5\' 2"  (1.575 m)   Body mass index is 28.86 kg/m.  Physical Exam  GENERAL APPEARANCE: Alert, conversant. No acute distress.  HEENT: Unremarkable. RESPIRATORY: Breathing is even, unlabored. Lung sounds are clear   CARDIOVASCULAR: Heart RRR no murmurs, rubs or gallops. Trace to 1+ peripheral edema.  GASTROINTESTINAL: Abdomen is soft, non-tender, not distended w/ normal bowel sounds.  NEUROLOGIC: Cranial nerves 2-12 grossly intact. Moves all extremities   Labs reviewed: Basic Metabolic Panel:  Recent Labs  03/19/16 1028  05/28/16 2230 05/29/16 0359 05/29/16 1717  06/02/16 0315 06/03/16 06/03/16 0833 06/04/16 06/04/16 0326  NA  --   < > 143  146* 145 147*  < > 140 138 138 137 137  K  --   < > 3.7  3.6 4.1 4.3  < > 4.2 3.8 3.8  --  3.7  CL  --   < > 110  109 117* 112*  < > 106  --  106  --  105  CO2  --   < > 21* 23  --   < > 25  --  26  --  23  GLUCOSE  --   < > 213*  215* 127* 139*  < > 123*  --  200*  --  109*  BUN  --   < > 6  5* 5* 10  < > 24* 21 21* 14 14  CREATININE  --   < > 0.78  0.60 0.82 1.16*  1.10*  < > 0.88 1.0  0.95 0.7 0.68  CALCIUM  --   < > 8.3* 8.4*  --   < > 9.0  --  8.8*  --  8.8*  MG  --   --  2.7* 2.4 2.3  --   --   --   --   --   --   PHOS 3.7  --   --   --   --   --   --   --   --   --   --   < > = values in this interval not displayed. No results found for: [facility] Liver Function  Tests:  Recent Labs  03/27/16 0258 05/28/16 0659 06/01/16 0315  AST 22 46* 63*  ALT 19 30 43  ALKPHOS 65 70 56  BILITOT 1.0 1.4* 2.9*  PROT 7.8 8.0 6.9  ALBUMIN 3.5 4.2 3.3*   No results for input(s): LIPASE, AMYLASE in the last 8760 hours. No results for input(s): AMMONIA in the last 8760 hours. CBC:  Recent Labs  10/07/15 1642 01/20/16 1539  03/19/16 0318  06/01/16 0315 06/02/16 06/02/16 0315 06/04/16 06/04/16 0326  WBC 8.5 4.1  < > 5.6  < > 14.0* 12.8 12.8* 11.0 11.0*  NEUTROABS 5,950 1.8  --  3.4  --   --   --   --   --   --   HGB 11.7 12.7  < > 11.9*  < > 7.8* 7.9* 7.9*  --  10.6*  HCT 36.2 39.4  < > 37.6  < > 24.8* 25* 25.1*  --  32.9*  MCV 84.0 83.8  < > 84.3  < > 84.6  --  85.7  --  86.1  PLT 132* 170.0  < > 183  < > 120* 140* 140*  --  186  < > = values in this interval not displayed. Lipid  Recent Labs  07/22/15 1444  CHOL 164  HDL 68.90  LDLCALC 77  TRIG 91.0   Cardiac Enzymes: No results for input(s): CKTOTAL, CKMB, CKMBINDEX, TROPONINI in the last 8760 hours. BNP: No results for input(s): BNP in the last 8760 hours. CBG:  Recent Labs  05/30/16 2038 05/31/16 0002 05/31/16 0347  GLUCAP 112* 109* 96    Procedures and Imaging Studies During Stay: Dg Chest 2 View  Result Date: 05/28/2016 CLINICAL DATA:  Atrial fibrillation.  Mitral stenosis EXAM: CHEST  2 VIEW COMPARISON:  March 26, 2016 FINDINGS: There is no edema or consolidation. Heart is mildly enlarged with pulmonary venous hypertension. Cardiac enlargement appears to primarily involving the left atrium and right ventricle. There is aortic atherosclerosis. No adenopathy. No bone lesions. IMPRESSION: No edema or consolidation. Pulmonary venous hypertension with enlargement of the left atrium and right ventricle. These are changes consistent with mitral stenosis. There is aortic atherosclerosis. Electronically Signed   By: Lowella Grip III M.D.   On: 05/28/2016 06:52   Dg Chest Port 1  View  Result Date: 05/31/2016 CLINICAL DATA:  MVR. EXAM: PORTABLE CHEST 1 VIEW COMPARISON:  05/30/2016 . FINDINGS: Left IJ sheath in stable position. Interim removal of bilateral chest tubes and mediastinal drainage catheter. Prior CABG. Prior mitral valve replacement. Left atrial appendage clip again noted. Stable cardiomegaly. Low lung volumes with basilar atelectasis. Small bilateral pleural effusions noted. No pneumothorax. IMPRESSION: 1. Left IJ sheath in stable position. Interim removal of bilateral chest tubes and mediastinal drainage catheter. No pneumothorax. 2. Prior CABG. Prior cardiac valve replacement. Left atrial appendage clip noted stable position. Stable  cardiomegaly. No pulmonary venous congestion. 3. Low lung volumes with bibasilar atelectasis again noted. Small bilateral pleural effusions again noted. Electronically Signed   By: Marcello Moores  Register   On: 05/31/2016 07:42   Dg Chest Port 1 View  Result Date: 05/30/2016 CLINICAL DATA:  Atelectasis. EXAM: PORTABLE CHEST 1 VIEW COMPARISON:  May 29, 2016 FINDINGS: The PA catheter has been removed with only a left IJ sheath remaining. The chest tubes and mediastinal drain are stable. No pneumothorax. Small bilateral effusions and underlying atelectasis are unchanged. No overt edema or other change. IMPRESSION: 1. Removal of PA catheter.  The support apparatus is stable. 2. Small effusions and underlying atelectasis in the bases are unchanged. Electronically Signed   By: Dorise Bullion III M.D   On: 05/30/2016 08:18   Dg Chest Port 1 View  Result Date: 05/29/2016 CLINICAL DATA:  Atelectasis. EXAM: PORTABLE CHEST 1 VIEW COMPARISON:  May 28, 2016 FINDINGS: The left apex was not completely included on the study. Within this limitation, no pneumothorax. The ET and NG tubes have been removed. Other support apparatus is stable. Mild bibasilar atelectasis. No overt edema. No other acute abnormalities or changes. IMPRESSION: 1. Removal of ET  tube. Other support apparatus stable. Mild bibasilar atelectasis. Electronically Signed   By: Dorise Bullion III M.D   On: 05/29/2016 08:25   Dg Chest Port 1 View  Result Date: 05/28/2016 CLINICAL DATA:  Thoracic surgery EXAM: PORTABLE CHEST 1 VIEW COMPARISON:  05/28/2016 630 hours FINDINGS: Endotracheal tube tip is 4.3 cm from the carina. NG tube is in the fundus of the stomach. Left jugular Swan-Ganz catheter tip is in the distal main pulmonary artery. Multiple low chest and mediastinal tubes are seen bilaterally. Left atrial appendage clip is in place. Mitral valve hardware replacement is in place. No pneumothorax. Cardiomegaly. No pneumothorax. No sign of pulmonary edema. Minimal basilar atelectasis. IMPRESSION: Support apparatus is in appropriate position. No pneumothorax or pulmonary edema. Electronically Signed   By: Marybelle Killings M.D.   On: 05/28/2016 17:20    Assessment/Plan:   Mitral valve stenosis, rheumatic  S/P mitral valve replacement with mechanical valve + CABG x1 + Maze procedure  S/P Maze operation for atrial fibrillation  S/P CABG x 1  Rheumatic mitral regurgitation  Postoperative anemia due to acute blood loss  Paroxysmal atrial fibrillation (HCC)  Hyperlipidemia, unspecified hyperlipidemia type  Essential hypertension  History of rheumatic fever  Pure hypercholesterolemia  Coronary artery disease involving native coronary artery of native heart without angina pectoris  Chronic diastolic CHF (congestive heart failure) ([place])  Asymptomatic stenosis of right carotid artery s/p right CEA 03/10/16   Patient is being discharged with the following home health services:  HH/OT/PT/Nursing/PT/INR  Patient is being discharged with the following durable medical equipment:  none  Patient has been advised to f/u with their PCP in 1-2 weeks to bring them up to date on their rehab stay.  Social services at facility was responsible for arranging this appointment.  Pt was  provided with a 30 day supply of prescriptions for medications and refills must be obtained from their PCP.  For controlled substances, a more limited supply may be provided adequate until PCP appointment only.    Time spent > 30 min;> 50% of time with patient was spent reviewing records, labs, tests and studies, counseling and developing plan of care  Noah Delaine. Sheppard Coil, MD

## 2016-06-27 ENCOUNTER — Encounter: Payer: Self-pay | Admitting: Internal Medicine

## 2016-06-29 ENCOUNTER — Telehealth: Payer: Self-pay | Admitting: Family

## 2016-06-29 NOTE — Telephone Encounter (Signed)
Noted.  BP Readings from Last 3 Encounters:  06/25/16 (!) 150/89  06/23/16 130/83  06/22/16 131/85   BP has been better controlled in our system.

## 2016-06-29 NOTE — Telephone Encounter (Addendum)
Caller name:Kathy Staples Relationship to patient: Can be Decker:  Reason for call:[facility] is calling to make you aware, pt has been discharged from hospital and they are in the home. Patients bp was running high earlier, 150/110, did have patient drink some water, it came down to 150/100. Patient had just taken her BP Meds before hand.

## 2016-06-30 NOTE — Telephone Encounter (Signed)
Per verbal from PCP, pt is due for follow up. Attempted to reach pt and left message for her to return my call.  Also notified Juliann Pulse of need for follow up.

## 2016-06-30 NOTE — Telephone Encounter (Signed)
Pt returned my call and scheduled follow up with PCP for 07/07/16 at 11:45AM.

## 2016-07-01 ENCOUNTER — Ambulatory Visit (INDEPENDENT_AMBULATORY_CARE_PROVIDER_SITE_OTHER): Payer: BC Managed Care – PPO | Admitting: Pharmacist Clinician (PhC)/ Clinical Pharmacy Specialist

## 2016-07-01 DIAGNOSIS — I480 Paroxysmal atrial fibrillation: Secondary | ICD-10-CM

## 2016-07-01 LAB — POCT INR: INR: 3.7

## 2016-07-02 ENCOUNTER — Other Ambulatory Visit: Payer: Self-pay | Admitting: *Deleted

## 2016-07-02 DIAGNOSIS — Z9889 Other specified postprocedural states: Secondary | ICD-10-CM

## 2016-07-02 DIAGNOSIS — Z951 Presence of aortocoronary bypass graft: Secondary | ICD-10-CM

## 2016-07-04 ENCOUNTER — Encounter: Payer: Self-pay | Admitting: Internal Medicine

## 2016-07-05 ENCOUNTER — Encounter: Payer: Self-pay | Admitting: Thoracic Surgery (Cardiothoracic Vascular Surgery)

## 2016-07-05 ENCOUNTER — Ambulatory Visit
Admission: RE | Admit: 2016-07-05 | Discharge: 2016-07-05 | Disposition: A | Discharge status: Home / Self Care | Payer: BC Managed Care – PPO | Source: Ambulatory Visit | Attending: Thoracic Surgery (Cardiothoracic Vascular Surgery) | Admitting: Thoracic Surgery (Cardiothoracic Vascular Surgery)

## 2016-07-05 ENCOUNTER — Ambulatory Visit (INDEPENDENT_AMBULATORY_CARE_PROVIDER_SITE_OTHER): Payer: Self-pay | Admitting: Thoracic Surgery (Cardiothoracic Vascular Surgery)

## 2016-07-05 VITALS — BP 135/83 | HR 94 | Resp 16 | Ht 62.0 in | Wt 141.2 lb

## 2016-07-05 DIAGNOSIS — Z951 Presence of aortocoronary bypass graft: Secondary | ICD-10-CM

## 2016-07-05 DIAGNOSIS — Z952 Presence of prosthetic heart valve: Secondary | ICD-10-CM

## 2016-07-05 DIAGNOSIS — Z9889 Other specified postprocedural states: Secondary | ICD-10-CM

## 2016-07-05 DIAGNOSIS — I480 Paroxysmal atrial fibrillation: Secondary | ICD-10-CM

## 2016-07-05 DIAGNOSIS — I2510 Atherosclerotic heart disease of native coronary artery without angina pectoris: Secondary | ICD-10-CM

## 2016-07-05 DIAGNOSIS — Z954 Presence of other heart-valve replacement: Secondary | ICD-10-CM

## 2016-07-05 DIAGNOSIS — I051 Rheumatic mitral insufficiency: Secondary | ICD-10-CM

## 2016-07-05 DIAGNOSIS — Z8679 Personal history of other diseases of the circulatory system: Secondary | ICD-10-CM

## 2016-07-05 NOTE — Patient Instructions (Signed)
Stop taking amiodarone  Notify the Coumadin clinic that you are no longer taking amiodarone  Continue all other previous medications without any changes at this time  Continue to monitor your blood pressure regularly  Continue to avoid any heavy lifting or strenuous use of your arms or shoulders for at least a total of three months from the time of surgery.  After three months you may gradually increase how much you lift or otherwise use your arms or chest as tolerated, with limits based upon whether or not activities lead to the return of significant discomfort.  You are encouraged to enroll and participate in the outpatient cardiac rehab program beginning as soon as practical.

## 2016-07-05 NOTE — Progress Notes (Signed)
[address]       [place] [address]             [phone]     CARDIOTHORACIC SURGERY OFFICE NOTE  Referring Provider is [name], [name], MD PCP is [name], NP   HPI:  Patient is a 66 year old African-American female with long-standing rheumatic heart disease, chronic diastolic congestive heart failure, recurrent paroxysmal atrial fibrillation, 2 previous embolic strokes, and cerebrovascular disease who returns to the office today for routine follow-up status post mitral valve replacement using a bileaflet mechanical prosthetic valve, coronary artery bypass grafting 1, and Maze procedure on 05/28/2016.  The patient's early postoperative recovery in the hospital was uncomplicated and she was discharged home on the seventh postoperative day.  Since hospital discharge she has been seen in follow-up by [name] on 06/23/2016. Coumadin dose has been monitored and adjusted through the Coumadin clinic.  Her most recent INR was 3.7 when it was checked on 07/01/2016. She returns to our office for routine follow-up today and she reports that she is doing very well.  She is now at home and she is no longer has any significant soreness in her chest. She is not having shortness of breath. Her activity level has gradually improved although she has not heard from the cardiac rehabilitation program. Home health physical therapy has been working with her and her strength is reportedly improving.  She is not having lower extremity edema. Her only complaint is that her appetite remains quite poor.   Current Outpatient Prescriptions  Medication Sig Dispense Refill  . amiodarone (PACERONE) 200 MG tablet Take 1 tablet (200 mg total) by mouth daily. 60 tablet 0  . aspirin EC 81 MG EC tablet Take 1 tablet (81 mg total) by mouth daily.    [name] Kitchen atorvastatin (LIPITOR) 80 MG tablet TAKE ONE TABLET BY MOUTH ONCE DAILY AT 6 PM 30 tablet 5  . chlorhexidine (PERIDEX) 0.12 %  solution Rinse with 15 mLs bid after breakfast and at bedtime.  Use in a swish and spit manner. 480 mL prn  . docusate sodium (COLACE) 100 MG capsule Take 1 capsule (100 mg total) by mouth 2 (two) times daily. 10 capsule 0  . feeding supplement (BOOST / RESOURCE BREEZE) LIQD Take 1 Container by mouth 2 (two) times daily between meals.    . metoprolol succinate (TOPROL-XL) 25 MG 24 hr tablet Take 25 mg by mouth daily.     . polyethylene glycol (MIRALAX / GLYCOLAX) packet Take 17 g by mouth daily. 14 each 0  . traMADol (ULTRAM) 50 MG tablet Take 50-100 mg by mouth every 4 (four) hours as needed for moderate pain or severe pain. Take 1 tablet (50 mg) for moderate pain and 2 tablets (100 mg) for severe pain.    [name] Kitchen warfarin (COUMADIN) 4 MG tablet Take 4 mg by mouth daily.     No current facility-administered medications for this visit.       Physical Exam:   BP 135/83 (BP Location: Right Arm, Patient Position: Sitting, Cuff Size: Large)   Pulse 94   Resp 16   Ht 5\' 2"  (1.575 m)   Wt 141 lb 3.2 oz (64 kg)   SpO2 99% Comment: ON RA  BMI 25.83 kg/m   General:  Well-appearing  Chest:   Clear to auscultation  CV:   Regular rate and rhythm without murmur  Incisions:  Healing nicely, sternum is stable  Abdomen:  Soft nontender  Extremities:  Warm and well-perfused, no edema  Diagnostic Tests:  2 channel telemetry rhythm strip demonstrates normal sinus rhythm   CHEST  2 VIEW  COMPARISON:  May 31, 2016  FINDINGS: Central catheter no longer apparent. No pneumothorax. Right lung is now clear. There is persistent atelectatic change in the left base region. There is a small left pleural effusion.  Heart is upper normal in size with mild pulmonary venous hypertension. Patient is status post mitral valve replacement. There is a left atrial appendage clamp. Patient is status post coronary artery bypass grafting. There is atherosclerotic calcification in the aorta. No adenopathy. No  bone lesions.  IMPRESSION: Mild pulmonary vascular congestion. Left base atelectasis with small left pleural effusion. No airspace consolidation. No pneumothorax. Stable cardiac silhouette. There is aortic atherosclerosis.   Electronically Signed   By: Lowella Grip III M.D.   On: 07/05/2016 10:52   Impression:  Patient is doing very well status post mitral valve replacement using a mechanical prosthetic valve, coronary artery bypass grafting, and Maze procedure. She is making steady progress with her physical recovery and she is maintaining sinus rhythm.  Her only complaint is that she still has a very poor appetite.  Plan:  I have instructed the patient to stop taking amiodarone as this may be contributing to her continued anorexia.  We have instructed the patient to notify the Coumadin clinic that she has been taken off of amiodarone. We have not made any other changes to her current medications. I've encouraged the patient to continue to gradually increase her physical activity. Once home health physical therapy has cleared her I think she would benefit from participation in the outpatient cardiac rehabilitation program. All of her questions been addressed.  The patient will return to our office in approximately 8 weeks for follow-up and rhythm check. She will call and return sooner should specific problems or questions arise.    Valentina Gu. Roxy Manns, MD 07/05/2016 11:17 AM

## 2016-07-07 ENCOUNTER — Ambulatory Visit (INDEPENDENT_AMBULATORY_CARE_PROVIDER_SITE_OTHER): Payer: BC Managed Care – PPO | Admitting: Family

## 2016-07-07 ENCOUNTER — Encounter: Payer: Self-pay | Admitting: Family

## 2016-07-07 VITALS — BP 151/90 | HR 97 | Temp 98.7°F | Resp 16 | Ht 62.5 in | Wt 143.4 lb

## 2016-07-07 DIAGNOSIS — Z954 Presence of other heart-valve replacement: Secondary | ICD-10-CM

## 2016-07-07 DIAGNOSIS — I10 Essential (primary) hypertension: Secondary | ICD-10-CM

## 2016-07-07 DIAGNOSIS — D649 Anemia, unspecified: Secondary | ICD-10-CM

## 2016-07-07 DIAGNOSIS — R634 Abnormal weight loss: Secondary | ICD-10-CM

## 2016-07-07 DIAGNOSIS — I480 Paroxysmal atrial fibrillation: Secondary | ICD-10-CM

## 2016-07-07 DIAGNOSIS — E785 Hyperlipidemia, unspecified: Secondary | ICD-10-CM | POA: Diagnosis not present

## 2016-07-07 LAB — CBC WITH DIFFERENTIAL/PLATELET
Basophils Absolute: 0.1 10*3/uL (ref 0.0–0.1)
Basophils Relative: 1.3 % (ref 0.0–3.0)
EOS PCT: 0.7 % (ref 0.0–5.0)
Eosinophils Absolute: 0 10*3/uL (ref 0.0–0.7)
HCT: 36.5 % (ref 36.0–46.0)
Hemoglobin: 11.5 g/dL — ABNORMAL LOW (ref 12.0–15.0)
LYMPHS ABS: 1.1 10*3/uL (ref 0.7–4.0)
Lymphocytes Relative: 19.1 % (ref 12.0–46.0)
MCHC: 31.7 g/dL (ref 30.0–36.0)
MCV: 84.2 fl (ref 78.0–100.0)
MONOS PCT: 11 % (ref 3.0–12.0)
Monocytes Absolute: 0.6 10*3/uL (ref 0.1–1.0)
NEUTROS ABS: 3.9 10*3/uL (ref 1.4–7.7)
NEUTROS PCT: 67.9 % (ref 43.0–77.0)
PLATELETS: 175 10*3/uL (ref 150.0–400.0)
RBC: 4.33 Mil/uL (ref 3.87–5.11)
RDW: 19.1 % — ABNORMAL HIGH (ref 11.5–15.5)
WBC: 5.8 10*3/uL (ref 4.0–10.5)

## 2016-07-07 LAB — LIPID PANEL
CHOL/HDL RATIO: 2
Cholesterol: 107 mg/dL (ref 0–200)
HDL: 44 mg/dL (ref >39.00)
LDL Cholesterol: 48 mg/dL (ref 0–99)
NonHDL: 62.67
TRIGLYCERIDES: 72 mg/dL (ref 0.0–149.0)
VLDL: 14.4 mg/dL (ref 0.0–40.0)

## 2016-07-07 LAB — TSH: TSH: 1.46 u[IU]/mL (ref 0.35–4.50)

## 2016-07-07 NOTE — Patient Instructions (Signed)
Please complete lab work prior to leaving.   

## 2016-07-07 NOTE — Assessment & Plan Note (Signed)
Rate stable, maintained on coumadin per coumadin clinic.

## 2016-07-07 NOTE — Progress Notes (Signed)
Pre visit review using our clinic review tool, if applicable. No additional management support is needed unless otherwise documented below in the visit note. 

## 2016-07-07 NOTE — Progress Notes (Signed)
Subjective:    Patient ID: Marie Malone, female    DOB: 1951/03/21, 66 y.o.   MRN: [id number]  HPI  Marie Malone is a 66 yr old female who presents today for follow up. Since her last visit she had a carotid endarterectomy in November as well as an extraction of her teeth.  She is working on getting new dentures which will be ready in March.   Mitral Valve Replacement- pt os s/p MVR with mechanical valve + cabg x 1 and maze procedure. She was d/c'd to [facility] x 3 weeks.  Now doing home PT.  She reports that she has been feeling good since she got home.  Sister notes appetite is not back to normal yet.  Her amiodarone was d/c'd by Dr. Ricard Dillon to help with her appetite. She denies cp or SOB.    Wt Readings from Last 3 Encounters:  07/07/16 143 lb 6.4 oz (65 kg)  07/05/16 141 lb 3.2 oz (64 kg)  06/25/16 157 lb 12.8 oz (71.6 kg)    Hyperlipidemia- maintained on lipitor 80 mg. Denies myalgia.   Lab Results  Component Value Date   CHOL 164 07/22/2015   HDL 68.90 07/22/2015   LDLCALC 77 07/22/2015   TRIG 91.0 07/22/2015   CHOLHDL 2 07/22/2015    HTN- current blood pressure medications include toprol xl 25mg  once daily.  BP Readings from Last 3 Encounters:  07/07/16 (!) 151/90  07/05/16 135/83  06/25/16 (!) 150/89   Hx of CVA- maintained on aspirin.    PAF- maintained on coumadin. Followed by coumadin clinic. Reports home health rn is checking at home and calling to the coumadin clinic with results.      Review of Systems Past Medical History:  Diagnosis Date  . Atrial fibrillation ([place])   . Chronic diastolic CHF (congestive heart failure) ([place])   . Complication of anesthesia    difficult to wake up from anesthesia  . Coronary artery disease involving native coronary artery without angina pectoris 03/26/2016   100% chronic occlusion of RCA  . Dyspnea   . Heart murmur   . History of rheumatic fever    as a child  . Hyperlipidemia   . Hypertension   . Mitral  stenosis   . OSA (obstructive sleep apnea)    mild per sleep study 2008  . Paroxysmal atrial fibrillation (H[facility])   . Pneumonia    double pneumonia once  . Pulmonary hypertension   . Rheumatic mitral regurgitation   . S/P balloon mitral valvuloplasty 2008   . 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 S/P mitral valve replacement with mechanical valve 05/28/2016   29 mm Sorin Carbomedics Optiform bileaflet mechanical prosthetic valve  . Stroke [facility])    x2     Social History   Social History  . Marital status: Married    Spouse name: N/A  . Number of children: 2  . Years of education: Associates   Occupational History  . Not on file.   Social History Main Topics  . Smoking status: Former Smoker    Packs/day: 1.00    Years: 5.00    Types: Cigarettes    Start date: 07/15/1970    Quit date: 05/11/1975  . Smokeless tobacco: Never Used     Comment: quit smoking 36 years ago  . Alcohol use No     Comment: drank years ago when young  . Drug  use: No  . Sexual activity: Not on file   Other Topics Concern  . Not on file   Social History Narrative   Retired Archivist   Married   She has 2 grown children-   Daughter lives in [place]   Son lives in [place]   6 grandchildren        Past Surgical History:  Procedure Laterality Date  . APPENDECTOMY  1989   appendix ruptured,had peritonitis  . BALLOON VALVULOPLASTY  2008   [facility]  . CARDIAC CATHETERIZATION N/A 03/25/2016   Procedure: Right/Left Heart Cath and Coronary Angiography;  Surgeon: Jettie Booze, MD;  Location: [place] CV LAB;  Service: Cardiovascular;  Laterality: N/A;  . COLONOSCOPY W/ POLYPECTOMY    . CORONARY ARTERY BYPASS GRAFT N/A 05/28/2016   Procedure: CORONARY ARTERY BYPASS GRAFTING (CABG) x 1 WITH ENDOSCOPIC HARVESTING OF RIGHT SAPHENOUS VEIN, EVH- PDA;  Surgeon: Rexene Alberts, MD;  Location: [place];  Service: Open Heart Surgery;  Laterality: N/A;  .  ENDARTERECTOMY Right 03/10/2016   Procedure: ENDARTERECTOMY CAROTID RIGHT;  Surgeon: Serafina Mitchell, MD;  Location: [place];  Service: Vascular;  Laterality: Right;  . EP IMPLANTABLE DEVICE N/A 03/11/2016   Procedure: Loop Recorder Removal;  Surgeon: Deboraha Sprang, MD;  Location: [place] CV LAB;  Service: Cardiovascular;  Laterality: N/A;  . 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 LOOP RECORDER IMPLANT N/A 04/10/2013   Procedure: LOOP RECORDER IMPLANT;  Surgeon: Coralyn Mark, MD;  Location: [place] CATH LAB;  Service: Cardiovascular;  Laterality: N/A;  . MAZE N/A 05/28/2016   Procedure: MAZE PROCEDURE AND APPLICATION OF  ATRICLIP LAA PROCLIP II 62 MM;  Surgeon: Rexene Alberts, MD;  Location: [place];  Service: Open Heart Surgery;  Laterality: N/A;  . MITRAL VALVE REPLACEMENT N/A 05/28/2016   Procedure: MITRAL VALVE (MV) REPLACEMENT USING 29 MM CARBOMEDICS OPTIFORM MECHANICAL BILEAFLET PROSTHETIC HEART VALVE;  Surgeon: Rexene Alberts, MD;  Location: [place];  Service: Open Heart Surgery;  Laterality: N/A;  . MULTIPLE EXTRACTIONS WITH ALVEOLOPLASTY N/A 03/30/2016   Procedure: EXTRACTION OF TOOTH #'S 2,5,6,11,15,16,20-30 AND 32 WITH ALVEOLOPLASTY AND BILATERAL MAXILLARY LATERAL EXOSTOSES REDUCTIONS;  Surgeon: Lenn Cal, DDS;  Location: [place];  Service: Oral Surgery;  Laterality: N/A;  . PATCH ANGIOPLASTY Right 03/10/2016   Procedure: PATCH ANGIOPLASTY CAROTID RIGHT USING Rueben Bash BIOLOGIC PATCH;  Surgeon: Serafina Mitchell, MD;  Location: [place];  Service: Vascular;  Laterality: Right;  . TEE WITHOUT CARDIOVERSION N/A 04/10/2013   Procedure: TRANSESOPHAGEAL ECHOCARDIOGRAM (TEE);  Surgeon: [name], MD;  Location: [facility] END[facility]COPY;  Service: Cardiovascular;  Laterality: N/A;  . TEE WITHOUT CARDIOVERSION N/A 03/24/2016   Procedure: TRANSESOPHAGEAL ECHOCARDIOGRAM (TEE);  Surgeon: Jerline Pain, MD;  Location: [place];  Service: Cardiovascular;   Laterality: N/A;  . TEE WITHOUT CARDIOVERSION N/A 05/28/2016   Procedure: TRANSESOPHAGEAL ECHOCARDIOGRAM (TEE);  Surgeon: Rexene Alberts, MD;  Location: [place];  Service: Open Heart Surgery;  Laterality: N/A;  . TUBAL LIGATION      Family History  Problem Relation Age of Onset  . Diabetes Mother   . Stroke Mother   . Diabetes Father   . Heart disease Father     CHF  . Cancer Father     kidney  . Diabetes Sister   . Diabetes Brother   . Hypertension Daughter     Allergies  Allergen Reactions  .  No Known Allergies     Current Outpatient Prescriptions on File Prior to Visit  Medication Sig Dispense Refill  . aspirin EC 81 MG EC tablet Take 1 tablet (81 mg total) by mouth daily.    [name] Kitchen atorvastatin (LIPITOR) 80 MG tablet TAKE ONE TABLET BY MOUTH ONCE DAILY AT 6 PM 30 tablet 5  . chlorhexidine (PERIDEX) 0.12 % solution Rinse with 15 mLs bid after breakfast and at bedtime.  Use in a swish and spit manner. 480 mL prn  . docusate sodium (COLACE) 100 MG capsule Take 1 capsule (100 mg total) by mouth 2 (two) times daily. 10 capsule 0  . feeding supplement (BOOST / RESOURCE BREEZE) LIQD Take 1 Container by mouth 2 (two) times daily between meals.    . metoprolol succinate (TOPROL-XL) 25 MG 24 hr tablet Take 25 mg by mouth daily.     . polyethylene glycol (MIRALAX / GLYCOLAX) packet Take 17 g by mouth daily. 14 each 0  . traMADol (ULTRAM) 50 MG tablet Take 50-100 mg by mouth every 4 (four) hours as needed for moderate pain or severe pain. Take 1 tablet (50 mg) for moderate pain and 2 tablets (100 mg) for severe pain.    [name] Kitchen warfarin (COUMADIN) 4 MG tablet Take 4 mg by mouth daily.     No current facility-administered medications on file prior to visit.     BP (!) 151/90 (BP Location: Left Arm, Cuff Size: Normal)   Pulse 97   Temp 98.7 F (37.1 C) (Oral)   Resp 16   Ht 5' 2.5" (1.588 m)   Wt 143 lb 6.4 oz (65 kg)   SpO2 99% Comment: room air  BMI 25.81 kg/m       Objective:    Physical Exam  Constitutional: She is oriented to person, place, and time. She appears well-developed and well-nourished.  HENT:  Head: Normocephalic and atraumatic.  Cardiovascular: Normal rate, regular rhythm and normal heart sounds.   No murmur heard. Pulmonary/Chest: Effort normal and breath sounds normal. No respiratory distress. She has no wheezes.  Musculoskeletal:  Sternotomy scar is well healed Abdominal incisions with intact scabs, no erythema or drainage  Neurological: She is alert and oriented to person, place, and time.  Skin: Skin is warm and dry.  Psychiatric: She has a normal mood and affect. Her behavior is normal. Judgment and thought content normal.          Assessment & Plan:

## 2016-07-07 NOTE — Assessment & Plan Note (Signed)
Tolerating statin, obtain follow-up lipid panel. 

## 2016-07-07 NOTE — Assessment & Plan Note (Signed)
BP is acceptable for her age- was much lower a few days ago. Continue toprol xl.

## 2016-07-07 NOTE — Assessment & Plan Note (Signed)
Clinically stable post operatively, continue PT.

## 2016-07-08 ENCOUNTER — Ambulatory Visit (INDEPENDENT_AMBULATORY_CARE_PROVIDER_SITE_OTHER): Payer: BC Managed Care – PPO | Admitting: Pharmacist Clinician (PhC)/ Clinical Pharmacy Specialist

## 2016-07-08 DIAGNOSIS — I480 Paroxysmal atrial fibrillation: Secondary | ICD-10-CM

## 2016-07-08 LAB — POCT INR: INR: 5.8

## 2016-07-12 ENCOUNTER — Ambulatory Visit (INDEPENDENT_AMBULATORY_CARE_PROVIDER_SITE_OTHER): Payer: BC Managed Care – PPO | Admitting: Pharmacist Clinician (PhC)/ Clinical Pharmacy Specialist

## 2016-07-12 DIAGNOSIS — I480 Paroxysmal atrial fibrillation: Secondary | ICD-10-CM

## 2016-07-12 LAB — POCT INR: INR: 3.8

## 2016-07-19 ENCOUNTER — Ambulatory Visit (INDEPENDENT_AMBULATORY_CARE_PROVIDER_SITE_OTHER): Payer: BC Managed Care – PPO | Admitting: Pharmacist Clinician (PhC)/ Clinical Pharmacy Specialist

## 2016-07-19 DIAGNOSIS — I480 Paroxysmal atrial fibrillation: Secondary | ICD-10-CM

## 2016-07-19 LAB — POCT INR: INR: 5.2

## 2016-07-20 ENCOUNTER — Encounter: Payer: Self-pay | Admitting: Internal Medicine

## 2016-07-20 NOTE — Progress Notes (Signed)
Location:  [place] Room Number: 509T Place of Service:  SNF [phone])  Hennie Duos, MD  Patient Care Team: Debbrah Alar, NP as PCP - General (Internal Medicine) Deneise Lever, MD as Consulting Physician (Pulmonary Disease) Collene Gobble, MD as Consulting Physician (Pulmonary Disease) Deboraha Sprang, MD as Consulting Physician (Cardiology) [name], MD as Consulting Physician (Cardiology) Lorretta Harp, MD as Consulting Physician (Cardiology)  Extended Emergency Contact Information Primary Emergency Contact: Catarina Hartshorn [place] Phone: [phone] Relation: Sister Secondary Emergency Contact: Zagami,Ja'Net  [place] Phone: [phone] Relation: Daughter    Allergies: No known allergies  Chief Complaint  Patient presents with  . Acute Visit    HPI: Patient is 66 y.o. female who is being seen acutely because an outbreak of Influenza A per CDC guidelines was recognized on 06/09/2016. Pt has no c/o flu like symptoms;therefore pt will need to be prophylaxed with Tamiflu for a minimum of 14 days per CDC protocol.  Past Medical History:  Diagnosis Date  . Atrial fibrillation ([place])   . Chronic diastolic CHF (congestive heart failure) ([place])   . Complication of anesthesia    difficult to wake up from anesthesia  . Coronary artery disease involving native coronary artery without angina pectoris 03/26/2016   100% chronic occlusion of RCA  . Dyspnea   . Heart murmur   . History of rheumatic fever    as a child  . Hyperlipidemia   . Hypertension   . Mitral stenosis   . OSA (obstructive sleep apnea)    mild per sleep study 2008  . Paroxysmal atrial fibrillation (H[facility])   . Pneumonia    double pneumonia once  . Pulmonary hypertension   . Rheumatic mitral regurgitation   . S/P balloon mitral valvuloplasty 2008   . 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  S/P mitral valve replacement with mechanical valve 05/28/2016   29 mm Sorin Carbomedics Optiform bileaflet mechanical prosthetic valve  . Stroke [facility])    x2    Past Surgical History:  Procedure Laterality Date  . APPENDECTOMY  1989   appendix ruptured,had peritonitis  . BALLOON VALVULOPLASTY  2008   [facility]  . CARDIAC CATHETERIZATION N/A 03/25/2016   Procedure: Right/Left Heart Cath and Coronary Angiography;  Surgeon: Jettie Booze, MD;  Location: [place] CV LAB;  Service: Cardiovascular;  Laterality: N/A;  . COLONOSCOPY W/ POLYPECTOMY    . CORONARY ARTERY BYPASS GRAFT N/A 05/28/2016   Procedure: CORONARY ARTERY BYPASS GRAFTING (CABG) x 1 WITH ENDOSCOPIC HARVESTING OF RIGHT SAPHENOUS VEIN, EVH- PDA;  Surgeon: Rexene Alberts, MD;  Location: [place];  Service: Open Heart Surgery;  Laterality: N/A;  . ENDARTERECTOMY Right 03/10/2016   Procedure: ENDARTERECTOMY CAROTID RIGHT;  Surgeon: Serafina Mitchell, MD;  Location: [place];  Service: Vascular;  Laterality: Right;  . EP IMPLANTABLE DEVICE N/A 03/11/2016   Procedure: Loop Recorder Removal;  Surgeon: Deboraha Sprang, MD;  Location: [place] CV LAB;  Service: Cardiovascular;  Laterality: N/A;  . 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 LOOP RECORDER IMPLANT N/A 04/10/2013   Procedure: LOOP RECORDER IMPLANT;  Surgeon: Coralyn Mark, MD;  Location: [place] CATH LAB;  Service: Cardiovascular;  Laterality: N/A;  . MAZE N/A 05/28/2016   Procedure: MAZE PROCEDURE AND APPLICATION OF  ATRICLIP LAA PROCLIP II 8 MM;  Surgeon: Rexene Alberts, MD;  Location: [place];  Service: Open Heart Surgery;  Laterality: N/A;  . MITRAL VALVE REPLACEMENT N/A 05/28/2016   Procedure: MITRAL VALVE (MV) REPLACEMENT USING 29 MM CARBOMEDICS OPTIFORM MECHANICAL BILEAFLET PROSTHETIC HEART VALVE;  Surgeon: Rexene Alberts, MD;  Location: [place];  Service: Open Heart Surgery;  Laterality: N/A;  . MULTIPLE EXTRACTIONS WITH  ALVEOLOPLASTY N/A 03/30/2016   Procedure: EXTRACTION OF TOOTH #'S 2,5,6,11,15,16,20-30 AND 32 WITH ALVEOLOPLASTY AND BILATERAL MAXILLARY LATERAL EXOSTOSES REDUCTIONS;  Surgeon: Lenn Cal, DDS;  Location: [place];  Service: Oral Surgery;  Laterality: N/A;  . PATCH ANGIOPLASTY Right 03/10/2016   Procedure: PATCH ANGIOPLASTY CAROTID RIGHT USING Rueben Bash BIOLOGIC PATCH;  Surgeon: Serafina Mitchell, MD;  Location: [place];  Service: Vascular;  Laterality: Right;  . TEE WITHOUT CARDIOVERSION N/A 04/10/2013   Procedure: TRANSESOPHAGEAL ECHOCARDIOGRAM (TEE);  Surgeon: [name], MD;  Location: [facility] ENDOSCOPY;  Service: Cardiovascular;  Laterality: N/A;  . TEE WITHOUT CARDIOVERSION N/A 03/24/2016   Procedure: TRANSESOPHAGEAL ECHOCARDIOGRAM (TEE);  Surgeon: Jerline Pain, MD;  Location: [employer];  Service: Cardiovascular;  Laterality: N/A;  . TEE WITHOUT CARDIOVERSION N/A 05/28/2016   Procedure: TRANSESOPHAGEAL ECHOCARDIOGRAM (TEE);  Surgeon: Rexene Alberts, MD;  Location: [place];  Service: Open Heart Surgery;  Laterality: N/A;  . TUBAL LIGATION      Allergies as of 06/09/2016      Reactions   No Known Allergies       Medication List       Accurate as of 06/09/16 11:59 PM. Always use your most recent med list.          aspirin 81 MG EC tablet Take 1 tablet (81 mg total) by mouth daily.   atorvastatin 80 MG tablet Commonly known as:  LIPITOR TAKE ONE TABLET BY MOUTH ONCE DAILY AT 6 PM   chlorhexidine 0.12 % solution Commonly known as:  PERIDEX Rinse with 15 mLs bid after breakfast and at bedtime.  Use in a swish and spit manner.   docusate sodium 100 MG capsule Commonly known as:  COLACE Take 1 capsule (100 mg total) by mouth 2 (two) times daily.   feeding supplement Liqd Take 1 Container by mouth 2 (two) times daily between meals.   metoprolol succinate 25 MG 24 hr tablet Commonly known as:  TOPROL-XL Take 25 mg by mouth daily.   polyethylene glycol packet Commonly known  as:  MIRALAX / GLYCOLAX Take 17 g by mouth daily.   traMADol 50 MG tablet Commonly known as:  ULTRAM Take 50-100 mg by mouth every 4 (four) hours as needed for moderate pain or severe pain. Take 1 tablet (50 mg) for moderate pain and 2 tablets (100 mg) for severe pain.   warfarin 4 MG tablet Commonly known as:  COUMADIN Take 4 mg by mouth daily.       No orders of the defined types were placed in this encounter.   Immunization History  Administered Date(s) Administered  . Influenza,inj,Quad PF,36+ Mos 04/02/2013, 02/13/2014  . PPD Test 06/04/2016  . Tdap 05/10/2009, 08/09/2011    Social History  Substance Use Topics  . Smoking status: Former Smoker    Packs/day: 1.00    Years: 5.00    Types: Cigarettes    Start date: 07/15/1970    Quit date: 05/11/1975  . Smokeless tobacco: Never Used     Comment: quit smoking 36 years ago  . Alcohol use  No     Comment: drank years ago when young    Review of Systems  DATA OBTAINED: from patient, nurse GENERAL:  no fevers SKIN: No itching, rash HEENT: no rhinorrhea, congestion, ST or ear pain RESPIRATORY: No cough, wheezing, SOB CARDIAC: No chest pain, palpitations, lower extremity edema  GI: No abdominal pain, No N/V/D or constipation, No heartburn or reflux  MUSCULOSKELETAL: No muscle aches NEUROLOGIC: No headache, dizziness   Vitals:   06/09/16 1523  BP: 138/77  Pulse: 82  Resp: 18  Temp: 98.1 F (36.7 C)   Body mass index is 27.94 kg/m. Physical Exam  GENERAL APPEARANCE: Alert, conversant, No acute distress  SKIN: No diaphoresis rash HEENT: Unremarkable RESPIRATORY: Breathing is even, unlabored. Lung sounds are clear   CARDIOVASCULAR: Heart RRR no murmurs, rubs or gallops. No peripheral edema  GASTROINTESTINAL: Abdomen is soft, non-tender, not distended w/ normal bowel sounds.   NEUROLOGIC: Cranial nerves 2-12 grossly intact PSYCHIATRIC: baseline, no mental status changes  Patient Active Problem List   Diagnosis  Date Noted  . HLD (hyperlipidemia) 06/25/2016  . Postoperative anemia due to acute blood loss 06/08/2016  . S/P mitral valve replacement with mechanical valve + CABG x1 + Maze procedure 05/28/2016  . S/P CABG x 1 05/28/2016  . S/P Maze operation for atrial fibrillation 05/28/2016  . Chronic diastolic CHF (congestive heart failure) ([place])   . Coronary artery disease involving native coronary artery without angina pectoris 03/26/2016  . Second degree Mobitz I AV block 03/25/2016  . Tongue deviation   . Asymptomatic stenosis of right carotid artery s/p right CEA 03/10/16 03/10/2016  . Cerebral infarction due to embolism of left middle cerebral artery ([place]) 04/09/2014  . Valvular vegetation 04/09/2014  . Encounter for therapeutic drug monitoring 06/04/2013  . Paroxysmal atrial fibrillation ([place]) 04/19/2013  . Cardiac device in situ 04/19/2013  . Endocarditis 04/13/2013  . History of CVA (cerebrovascular accident) 04/12/2013  . Pulmonary hypertension 04/06/2013  . S/P balloon mitral valvuloplasty 2008   . HTN (hypertension) 08/30/2012  . History of rheumatic fever 08/30/2012    CMP     Component Value Date/Time   NA 137 06/04/2016 0326   NA 137 06/04/2016   K 3.7 06/04/2016 0326   CL 105 06/04/2016 0326   CO2 23 06/04/2016 0326   GLUCOSE 109 (H) 06/04/2016 0326   BUN 14 06/04/2016 0326   BUN 14 06/04/2016   CREATININE 0.68 06/04/2016 0326   CREATININE 0.98 01/30/2016 1410   CALCIUM 8.8 (L) 06/04/2016 0326   PROT 6.9 06/01/2016 0315   ALBUMIN 3.3 (L) 06/01/2016 0315   AST 63 (H) 06/01/2016 0315   ALT 43 06/01/2016 0315   ALKPHOS 56 06/01/2016 0315   BILITOT 2.9 (H) 06/01/2016 0315   GFRNONAA >60 06/04/2016 0326   GFRNONAA 64 12/17/2013 1607   GFRAA >60 06/04/2016 0326   GFRAA 74 12/17/2013 1607    Recent Labs  03/19/16 1028  05/28/16 2230 05/29/16 0359 05/29/16 1717  06/02/16 0315 06/03/16 06/03/16 0833 06/04/16 06/04/16 0326  NA  --   < > 143  146* 145 147*  < >  140 138 138 137 137  K  --   < > 3.7  3.6 4.1 4.3  < > 4.2 3.8 3.8  --  3.7  CL  --   < > 110  109 117* 112*  < > 106  --  106  --  105  CO2  --   < > 21* 23  --   < >  25  --  26  --  23  GLUCOSE  --   < > 213*  215* 127* 139*  < > 123*  --  200*  --  109*  BUN  --   < > 6  5* 5* 10  < > 24* 21 21* 14 14  CREATININE  --   < > 0.78  0.60 0.82 1.16*  1.10*  < > 0.88 1.0 0.95 0.7 0.68  CALCIUM  --   < > 8.3* 8.4*  --   < > 9.0  --  8.8*  --  8.8*  MG  --   --  2.7* 2.4 2.3  --   --   --   --   --   --   PHOS 3.7  --   --   --   --   --   --   --   --   --   --   < > = values in this interval not displayed.  Recent Labs  03/27/16 0258 05/28/16 0659 06/01/16 0315  AST 22 46* 63*  ALT 19 30 43  ALKPHOS 65 70 56  BILITOT 1.0 1.4* 2.9*  PROT 7.8 8.0 6.9  ALBUMIN 3.5 4.2 3.3*    Recent Labs  01/20/16 1539  03/19/16 0318  06/02/16 0315 06/04/16 06/04/16 0326 07/07/16 1231  WBC 4.1  < > 5.6  < > 12.8* 11.0 11.0* 5.8  NEUTROABS 1.8  --  3.4  --   --   --   --  3.9  HGB 12.7  < > 11.9*  < > 7.9*  --  10.6* 11.5*  HCT 39.4  < > 37.6  < > 25.1*  --  32.9* 36.5  MCV 83.8  < > 84.3  < > 85.7  --  86.1 84.2  PLT 170.0  < > 183  < > 140*  --  186 175.0  < > = values in this interval not displayed.  Recent Labs  07/22/15 1444 07/07/16 1231  CHOL 164 107  LDLCALC 77 48  TRIG 91.0 72.0   No results found for: [facility] Lab Results  Component Value Date   TSH 1.46 07/07/2016   Lab Results  Component Value Date   HGBA1C 5.5 05/28/2016   Lab Results  Component Value Date   CHOL 107 07/07/2016   HDL 44.00 07/07/2016   LDLCALC 48 07/07/2016   TRIG 72.0 07/07/2016   CHOLHDL 2 07/07/2016    Significant Diagnostic Results in last 30 days:  Dg Chest 2 View  Result Date: 07/05/2016 CLINICAL DATA:  Status post mitral valve replacement. Recent atrial fibrillation EXAM: CHEST  2 VIEW COMPARISON:  May 31, 2016 FINDINGS: Central catheter no longer apparent. No pneumothorax.  Right lung is now clear. There is persistent atelectatic change in the left base region. There is a small left pleural effusion. Heart is upper normal in size with mild pulmonary venous hypertension. Patient is status post mitral valve replacement. There is a left atrial appendage clamp. Patient is status post coronary artery bypass grafting. There is atherosclerotic calcification in the aorta. No adenopathy. No bone lesions. IMPRESSION: Mild pulmonary vascular congestion. Left base atelectasis with small left pleural effusion. No airspace consolidation. No pneumothorax. Stable cardiac silhouette. There is aortic atherosclerosis. Electronically Signed   By: Lowella Grip III M.D.   On: 07/05/2016 10:52    Assessment and Plan  EXPOSURE TO FLU/ INFLUENZA OUTBREAK AT SNF-   CrCl calculated  by me-   66     Dose for 14 days- 75 mg daily Pt will be monitored daily for flu like symptoms                                                                                 Webb Silversmith D. Sheppard Coil, MD

## 2016-07-21 ENCOUNTER — Ambulatory Visit: Payer: Self-pay | Admitting: Cardiology

## 2016-07-26 ENCOUNTER — Ambulatory Visit (INDEPENDENT_AMBULATORY_CARE_PROVIDER_SITE_OTHER): Payer: BC Managed Care – PPO | Admitting: Pharmacist

## 2016-07-26 ENCOUNTER — Telehealth (HOSPITAL_COMMUNITY): Payer: Self-pay | Admitting: *Deleted

## 2016-07-26 DIAGNOSIS — I480 Paroxysmal atrial fibrillation: Secondary | ICD-10-CM

## 2016-07-26 LAB — PROTIME-INR: INR: 2.7 — AB (ref <=1.1)

## 2016-07-26 NOTE — Telephone Encounter (Signed)
Received referral for cardiac rehab phase II for [place] [facility].  Pt had heart surgery in January and was referred to [facility].  Called and spoke to pt.  Pt had not heard from [facility]. Pt indicated that she ws receiving some therapy in the home.  Pt preferred to attend cardiac rehab at [facility].  Called [facility] ( cardiac rehab for [facility] ) and left message requesting call back.  Contact information provided. Cherre Huger, BSN Cardiac and Training and development officer

## 2016-07-28 ENCOUNTER — Other Ambulatory Visit: Payer: Self-pay | Admitting: Family

## 2016-07-28 NOTE — Telephone Encounter (Signed)
Okay to refill? 

## 2016-07-29 ENCOUNTER — Ambulatory Visit (INDEPENDENT_AMBULATORY_CARE_PROVIDER_SITE_OTHER): Payer: BC Managed Care – PPO | Admitting: Pharmacist

## 2016-07-29 DIAGNOSIS — I480 Paroxysmal atrial fibrillation: Secondary | ICD-10-CM

## 2016-07-29 LAB — PROTIME-INR: INR: 2.1 — AB (ref <=1.1)

## 2016-07-31 ENCOUNTER — Encounter: Payer: Self-pay | Admitting: Internal Medicine

## 2016-08-01 ENCOUNTER — Other Ambulatory Visit: Payer: Self-pay | Admitting: Internal Medicine

## 2016-08-02 ENCOUNTER — Other Ambulatory Visit: Payer: Self-pay | Admitting: *Deleted

## 2016-08-02 MED ORDER — WARFARIN SODIUM 4 MG PO TABS: ORAL_TABLET | ORAL | 1 refills | Status: DC

## 2016-08-05 ENCOUNTER — Ambulatory Visit (INDEPENDENT_AMBULATORY_CARE_PROVIDER_SITE_OTHER): Payer: BC Managed Care – PPO | Admitting: Pharmacist

## 2016-08-05 DIAGNOSIS — I480 Paroxysmal atrial fibrillation: Secondary | ICD-10-CM

## 2016-08-05 LAB — PROTIME-INR: INR: 1.8 — AB (ref <=1.1)

## 2016-08-10 NOTE — Progress Notes (Signed)
HPI: FU MS and atrial fibrillation. Patient has a history of rheumatic fever/MS. Patient had valvuloplasty at [facility] in 2008. Abdominal ultrasound in April of 2014 normal. Prior ILRrevealed paroxysmal atrial fibrillation and anticoagulation was initiated. Patient had carotid endarterectomy November 2017. Admitted with syncope that same month. Had Mobitz type I second-degree AV block when in sinus rhythm but rate controlled when in atrial fibrillation. CTA November 2017 following CEA showed prior right carotid endarterectomy and no hemodynamically significant stenosis of the left carotid. TEE November 2017 showed normal LV function, moderate to severe mitral stenosis, mild left atrial enlargement. Cardiac catheterization November 2017 showed normal LV systolic function, moderate mitral stenosis with mean gradient 10 mmHg, 100% right coronary artery with left-to-right collaterals and moderate pulmonary hypertension. Pt had mechanical MVR, CABG (SVG to PDA) and Maze 1/18. Since last seen, patient describes residual chest soreness. No other chest pain. She denies dyspnea, palpitations or syncope.  Current Outpatient Prescriptions  Medication Sig Dispense Refill  . aspirin EC 81 MG EC tablet Take 1 tablet (81 mg total) by mouth daily.    [name] Kitchen atorvastatin (LIPITOR) 80 MG tablet TAKE ONE TABLET BY MOUTH ONCE DAILY AT 6 PM 30 tablet 5  . chlorhexidine (PERIDEX) 0.12 % solution Rinse with 15 mLs bid after breakfast and at bedtime.  Use in a swish and spit manner. 480 mL prn  . docusate sodium (COLACE) 100 MG capsule Take 1 capsule (100 mg total) by mouth 2 (two) times daily. 10 capsule 0  . feeding supplement (BOOST / RESOURCE BREEZE) LIQD Take 1 Container by mouth 2 (two) times daily between meals.    . metoprolol succinate (TOPROL-XL) 25 MG 24 hr tablet TAKE ONE TABLET BY MOUTH ONCE DAILY 30 tablet 5  . polyethylene glycol (MIRALAX / GLYCOLAX) packet Take 17 g by mouth daily. 14 each 0  . traMADol  (ULTRAM) 50 MG tablet Take 50-100 mg by mouth every 4 (four) hours as needed for moderate pain or severe pain. Take 1 tablet (50 mg) for moderate pain and 2 tablets (100 mg) for severe pain.    [name] Kitchen warfarin (COUMADIN) 4 MG tablet Take 1/2 to 1 tablet by mouth daily as directed by coumadin clinic 30 tablet 1   No current facility-administered medications for this visit.      Past Medical History:  Diagnosis Date  . Atrial fibrillation ([place])   . Chronic diastolic CHF (congestive heart failure) ([place])   . Complication of anesthesia    difficult to wake up from anesthesia  . Coronary artery disease involving native coronary artery without angina pectoris 03/26/2016   100% chronic occlusion of RCA  . Dyspnea   . Heart murmur   . History of rheumatic fever    as a child  . Hyperlipidemia   . Hypertension   . Mitral stenosis   . OSA (obstructive sleep apnea)    mild per sleep study 2008  . Paroxysmal atrial fibrillation ([facility])   . Pneumonia    double pneumonia once  . Pulmonary hypertension   . Rheumatic mitral regurgitation   . S/P balloon mitral valvuloplasty 2008   . 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 S/P mitral valve replacement with mechanical valve 05/28/2016   29 mm Sorin Carbomedics Optiform bileaflet mechanical prosthetic valve  . Stroke [facility])    x2    Past Surgical History:  Procedure Laterality Date  . APPENDECTOMY  1989  appendix ruptured,had peritonitis  . BALLOON VALVULOPLASTY  2008   [facility]  . CARDIAC CATHETERIZATION N/A 03/25/2016   Procedure: Right/Left Heart Cath and Coronary Angiography;  Surgeon: Jettie Booze, MD;  Location: [place] CV LAB;  Service: Cardiovascular;  Laterality: N/A;  . COLONOSCOPY W/ POLYPECTOMY    . CORONARY ARTERY BYPASS GRAFT N/A 05/28/2016   Procedure: CORONARY ARTERY BYPASS GRAFTING (CABG) x 1 WITH ENDOSCOPIC HARVESTING OF RIGHT SAPHENOUS VEIN, EVH- PDA;  Surgeon: Rexene Alberts, MD;  Location: [place];  Service: Open Heart Surgery;  Laterality: N/A;  . ENDARTERECTOMY Right 03/10/2016   Procedure: ENDARTERECTOMY CAROTID RIGHT;  Surgeon: Serafina Mitchell, MD;  Location: [place];  Service: Vascular;  Laterality: Right;  . EP IMPLANTABLE DEVICE N/A 03/11/2016   Procedure: Loop Recorder Removal;  Surgeon: Deboraha Sprang, MD;  Location: [place] CV [facility];  Service: Cardiovascular;  Laterality: N/A;  . 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 LOOP RECORDER IMPLANT N/A 04/10/2013   Procedure: LOOP RECORDER IMPLANT;  Surgeon: Coralyn Mark, MD;  Location: [place] CATH LAB;  Service: Cardiovascular;  Laterality: N/A;  . MAZE N/A 05/28/2016   Procedure: MAZE PROCEDURE AND APPLICATION OF  ATRICLIP LAA PROCLIP II 72 MM;  Surgeon: Rexene Alberts, MD;  Location: [place];  Service: Open Heart Surgery;  Laterality: N/A;  . MITRAL VALVE REPLACEMENT N/A 05/28/2016   Procedure: MITRAL VALVE (MV) REPLACEMENT USING 29 MM CARBOMEDICS OPTIFORM MECHANICAL BILEAFLET PROSTHETIC HEART VALVE;  Surgeon: Rexene Alberts, MD;  Location: [place];  Service: Open Heart Surgery;  Laterality: N/A;  . MULTIPLE EXTRACTIONS WITH ALVEOLOPLASTY N/A 03/30/2016   Procedure: EXTRACTION OF TOOTH #'S 2,5,6,11,15,16,20-30 AND 32 WITH ALVEOLOPLASTY AND BILATERAL MAXILLARY LATERAL EXOSTOSES REDUCTIONS;  Surgeon: Lenn Cal, DDS;  Location: [place];  Service: Oral Surgery;  Laterality: N/A;  . PATCH ANGIOPLASTY Right 03/10/2016   Procedure: PATCH ANGIOPLASTY CAROTID RIGHT USING Rueben Bash BIOLOGIC PATCH;  Surgeon: Serafina Mitchell, MD;  Location: [place];  Service: Vascular;  Laterality: Right;  . TEE WITHOUT CARDIOVERSION N/A 04/10/2013   Procedure: TRANSESOPHAGEAL ECHOCARDIOGRAM (TEE);  Surgeon: [name], MD;  Location: [facility] ENDOSCOPY;  Service: Cardiovascular;  Laterality: N/A;  . TEE WITHOUT CARDIOVERSION N/A 03/24/2016   Procedure: TRANSESOPHAGEAL ECHOCARDIOGRAM (TEE);  Surgeon: Jerline Pain,  MD;  Location: [place];  Service: Cardiovascular;  Laterality: N/A;  . TEE WITHOUT CARDIOVERSION N/A 05/28/2016   Procedure: TRANSESOPHAGEAL ECHOCARDIOGRAM (TEE);  Surgeon: Rexene Alberts, MD;  Location: [place];  Service: Open Heart Surgery;  Laterality: N/A;  . TUBAL LIGATION      Social History   Social History  . Marital status: Married    Spouse name: N/A  . Number of children: 2  . Years of education: Associates   Occupational History  . Not on file.   Social History Main Topics  . Smoking status: Former Smoker    Packs/day: 1.00    Years: 5.00    Types: Cigarettes    Start date: 07/15/1970    Quit date: 05/11/1975  . Smokeless tobacco: Never Used     Comment: quit smoking 36 years ago  . Alcohol use No     Comment: drank years ago when young  . Drug use: No  . Sexual activity: Not on file   Other Topics Concern  . Not on file   Social History Narrative   Retired Archivist  Married   She has 2 grown children-   Daughter lives in [place]   Son lives in [place]   6 grandchildren        Family History  Problem Relation Age of Onset  . Diabetes Mother   . Stroke Mother   . Diabetes Father   . Heart disease Father     CHF  . Cancer Father     kidney  . Diabetes Sister   . Diabetes Brother   . Hypertension Daughter     ROS: no fevers or chills, productive cough, hemoptysis, dysphasia, odynophagia, melena, hematochezia, dysuria, hematuria, rash, seizure activity, orthopnea, PND, pedal edema, claudication. Remaining systems are negative.  Physical Exam: Well-developed well-nourished in no acute distress.  Skin is warm and dry.  HEENT is normal.  Neck is supple.  Chest is clear to auscultation with normal expansion. Status post sternotomy Cardiovascular exam is regular rate and rhythm. Crisp mechanical valve sound Abdominal exam nontender or distended. No masses palpated. Extremities show no edema. neuro grossly intact  A/P  1  status post mitral valve replacement-continue Coumadin and ASA. Continue SBE prophylaxis. We will await echocardiogram performed earlier today for baseline study status post mitral valve replacement.  2 paroxysmal atrial fibrillation/flutter-continue metoprolol and Coumadin. Patient remains in sinus rhythm on examination.   3 hypertension-blood pressure controlled. Continue present medications.  4 hyperlipidemia-continue statin.  5 coronary artery disease-continue aspirin and statin.  6 carotid artery disease-status post carotid endarterectomy-continue statin. Plan repeat carotid Dopplers November 2018.  [name], MD

## 2016-08-17 ENCOUNTER — Ambulatory Visit (INDEPENDENT_AMBULATORY_CARE_PROVIDER_SITE_OTHER): Payer: BC Managed Care – PPO | Admitting: Pharmacist Clinician (PhC)/ Clinical Pharmacy Specialist

## 2016-08-17 DIAGNOSIS — I4891 Unspecified atrial fibrillation: Secondary | ICD-10-CM | POA: Diagnosis not present

## 2016-08-17 DIAGNOSIS — I480 Paroxysmal atrial fibrillation: Secondary | ICD-10-CM

## 2016-08-17 LAB — POCT INR: INR: 1.4

## 2016-08-18 ENCOUNTER — Ambulatory Visit (HOSPITAL_BASED_OUTPATIENT_CLINIC_OR_DEPARTMENT_OTHER)
Admission: RE | Admit: 2016-08-18 | Discharge: 2016-08-18 | Disposition: A | Discharge status: Home / Self Care | Payer: BC Managed Care – PPO | Source: Ambulatory Visit | Attending: Cardiology | Admitting: Cardiology

## 2016-08-18 ENCOUNTER — Ambulatory Visit (INDEPENDENT_AMBULATORY_CARE_PROVIDER_SITE_OTHER): Payer: BC Managed Care – PPO | Admitting: Cardiology

## 2016-08-18 ENCOUNTER — Encounter: Payer: Self-pay | Admitting: Cardiology

## 2016-08-18 VITALS — BP 130/79 | HR 87 | Ht 62.0 in | Wt 141.4 lb

## 2016-08-18 DIAGNOSIS — I10 Essential (primary) hypertension: Secondary | ICD-10-CM | POA: Diagnosis not present

## 2016-08-18 DIAGNOSIS — I4891 Unspecified atrial fibrillation: Secondary | ICD-10-CM | POA: Insufficient documentation

## 2016-08-18 DIAGNOSIS — I050 Rheumatic mitral stenosis: Secondary | ICD-10-CM | POA: Diagnosis not present

## 2016-08-18 DIAGNOSIS — I509 Heart failure, unspecified: Secondary | ICD-10-CM | POA: Diagnosis not present

## 2016-08-18 DIAGNOSIS — Z952 Presence of prosthetic heart valve: Secondary | ICD-10-CM | POA: Insufficient documentation

## 2016-08-18 DIAGNOSIS — I110 Hypertensive heart disease with heart failure: Secondary | ICD-10-CM | POA: Diagnosis not present

## 2016-08-18 DIAGNOSIS — I272 Pulmonary hypertension, unspecified: Secondary | ICD-10-CM | POA: Insufficient documentation

## 2016-08-18 DIAGNOSIS — Z951 Presence of aortocoronary bypass graft: Secondary | ICD-10-CM | POA: Insufficient documentation

## 2016-08-18 DIAGNOSIS — I071 Rheumatic tricuspid insufficiency: Secondary | ICD-10-CM | POA: Insufficient documentation

## 2016-08-18 DIAGNOSIS — I059 Rheumatic mitral valve disease, unspecified: Secondary | ICD-10-CM | POA: Diagnosis not present

## 2016-08-18 DIAGNOSIS — I2510 Atherosclerotic heart disease of native coronary artery without angina pectoris: Secondary | ICD-10-CM | POA: Diagnosis not present

## 2016-08-18 DIAGNOSIS — E7800 Pure hypercholesterolemia, unspecified: Secondary | ICD-10-CM

## 2016-08-18 DIAGNOSIS — I480 Paroxysmal atrial fibrillation: Secondary | ICD-10-CM

## 2016-08-18 NOTE — Progress Notes (Signed)
  Echocardiogram 2D Echocardiogram has been performed.  Marie Malone 08/18/2016, 11:10 AM

## 2016-08-18 NOTE — Patient Instructions (Signed)
Your physician wants you to follow-up in: 6 MONTHS WITH DR CRENSHAW You will receive a reminder letter in the mail two months in advance. If you don't receive a letter, please call our office to schedule the follow-up appointment.   If you need a refill on your cardiac medications before your next appointment, please call your pharmacy.  

## 2016-08-30 ENCOUNTER — Encounter: Payer: Self-pay | Admitting: Thoracic Surgery (Cardiothoracic Vascular Surgery)

## 2016-08-30 ENCOUNTER — Ambulatory Visit (INDEPENDENT_AMBULATORY_CARE_PROVIDER_SITE_OTHER): Payer: BC Managed Care – PPO | Admitting: Pharmacist Clinician (PhC)/ Clinical Pharmacy Specialist

## 2016-08-30 ENCOUNTER — Ambulatory Visit (INDEPENDENT_AMBULATORY_CARE_PROVIDER_SITE_OTHER): Payer: BC Managed Care – PPO | Admitting: Thoracic Surgery (Cardiothoracic Vascular Surgery)

## 2016-08-30 VITALS — BP 164/94 | HR 99 | Resp 16 | Ht 62.0 in | Wt 141.0 lb

## 2016-08-30 DIAGNOSIS — I480 Paroxysmal atrial fibrillation: Secondary | ICD-10-CM

## 2016-08-30 DIAGNOSIS — Z9889 Other specified postprocedural states: Secondary | ICD-10-CM | POA: Diagnosis not present

## 2016-08-30 DIAGNOSIS — I4891 Unspecified atrial fibrillation: Secondary | ICD-10-CM

## 2016-08-30 DIAGNOSIS — Z951 Presence of aortocoronary bypass graft: Secondary | ICD-10-CM | POA: Diagnosis not present

## 2016-08-30 DIAGNOSIS — Z8679 Personal history of other diseases of the circulatory system: Secondary | ICD-10-CM | POA: Diagnosis not present

## 2016-08-30 DIAGNOSIS — Z954 Presence of other heart-valve replacement: Secondary | ICD-10-CM

## 2016-08-30 LAB — POCT INR: INR: 1.2

## 2016-08-30 NOTE — Patient Instructions (Signed)

## 2016-08-30 NOTE — Progress Notes (Signed)
Marie Malone       [place] [address]             [phone]     CARDIOTHORACIC SURGERY OFFICE NOTE  Referring Provider is Marie Breed, [name], MD PCP is Marie Malone   HPI:  Patient is a 66 year old African-American female with long-standing rheumatic heart disease, chronic diastolic congestive heart failure, recurrent paroxysmal atrial fibrillation, 2 previous embolic strokes, and cerebrovascular disease who returns to the office today for routine follow-up status post mitral valve replacement using a bileaflet mechanical prosthetic valve, coronary artery bypass grafting 1, and Maze procedure on 05/28/2016.  The patient's early postoperative recovery in the hospital was uncomplicated and she was last seen here in our office on 07/05/2016 at which time she was doing well and maintaining sinus rhythm. Since then she has continued to do very well. She was seen in follow-up by [name] on 08/18/2016 and was maintaining sinus rhythm at that time. Routine follow-up echocardiogram revealed normal functioning mechanical prosthetic valve in the mitral position with left ventricular ejection fraction estimated 45-50%. She returns to our office today for routine follow-up. She has been followed regularly in the Coumadin clinic. Her last 2 prothrombin time checks were both subtherapeutic. She was seen in the Coumadin clinic today and her Coumadin dose increased appropriately. She reports that she is doing exceptionally well. She now has minimal residual soreness in the lower aspect of her chest. Her activity level is quite good and she happily reports that her husband can no longer keep up with her. She reports no symptoms of exertional shortness of breath or chest discomfort. She states that she feels much better than she did prior to surgery. She has not had any palpitations or other symptoms to suggest a recurrence of atrial fibrillation. Overall she is delighted with  her progress.   Current Outpatient Prescriptions  Medication Sig Dispense Refill  . aspirin EC 81 MG EC tablet Take 1 tablet (81 mg total) by mouth daily.    [name] Kitchen atorvastatin (LIPITOR) 80 MG tablet TAKE ONE TABLET BY MOUTH ONCE DAILY AT 6 PM 30 tablet 5  . chlorhexidine (PERIDEX) 0.12 % solution Rinse with 15 mLs bid after breakfast and at bedtime.  Use in a swish and spit manner. 480 mL prn  . docusate sodium (COLACE) 100 MG capsule Take 1 capsule (100 mg total) by mouth 2 (two) times daily. 10 capsule 0  . feeding supplement (BOOST / RESOURCE BREEZE) LIQD Take 1 Container by mouth 2 (two) times daily between meals.    . metoprolol succinate (TOPROL-XL) 25 MG 24 hr tablet TAKE ONE TABLET BY MOUTH ONCE DAILY 30 tablet 5  . polyethylene glycol (MIRALAX / GLYCOLAX) packet Take 17 g by mouth daily. (Patient taking differently: Take 17 g by mouth daily as needed. ) 14 each 0  . warfarin (COUMADIN) 4 MG tablet Take 1/2 to 1 tablet by mouth daily as directed by coumadin clinic 30 tablet 1   No current facility-administered medications for this visit.       Physical Exam:   BP (!) 164/94 (BP Location: Left Arm, Patient Position: Sitting, Cuff Size: Large)   Pulse 99   Resp 16   Ht 5\' 2"  (1.575 m)   Wt 141 lb (64 kg)   SpO2 96% Comment: ON RA  BMI 25.79 kg/m   General:  Well-appearing  Chest:   Clear to auscultation  CV:   Regular rate  and rhythm without murmur, mechanical heart valve sounds  Incisions:  Well-healed, sternum is stable  Abdomen:  Soft and nontender  Extremities:  Warm and well-perfused  Diagnostic Tests:  2 channel telemetry rhythm strip demonstrates normal sinus rhythm   Transthoracic Echocardiography  Patient:    Marie Malone, Marie Malone MR #:       [id number] Study Date: 08/18/2016 Gender:     F Age:        37 Height:     157.5 cm Weight:     61.7 kg BSA:        1.65 m^2 Pt. Status: Room:   ATTENDING    [name]  REFERRING     [name]  SONOGRAPHER  [name], [name]  PERFORMING   [name], Outpatient  cc:  ------------------------------------------------------------------- LV EF: 45% -   50%  ------------------------------------------------------------------- Indications:      Rheumatic MS 394.0.  ------------------------------------------------------------------- History:   PMH:   Atrial fibrillation.  Coronary artery disease. Congestive heart failure.  Primary pulmonary hypertension.  Risk factors:  Hypertension.  ------------------------------------------------------------------- Study Conclusions  - Left ventricle: The cavity size was normal. Systolic function was   mildly reduced. The estimated ejection fraction was in the range   of 45% to 50%. Diffuse hypokinesis. - Ventricular septum: The contour showed diastolic flattening. - Aortic valve: Moderately thickened, moderately calcified   leaflets. Cusp separation was reduced. - Mitral valve: A mechanical prosthesis was present. Well seated   and functioning normally. Valve area by continuity equation   (using LVOT flow): 0.6 cm^2. - Left atrium: The atrium was moderately dilated. - Right ventricle: The cavity size was mildly dilated. Wall   thickness was normal. - Right atrium: The atrium was severely dilated. - Pulmonary arteries: Systolic pressure was mildly increased. PA   peak pressure: 42 mm Hg (S).  Impressions:  - When compared to prior, mechanical MV is new, EF is reduced.  ------------------------------------------------------------------- Labs, prior tests, procedures, and surgery: Coronary artery bypass grafting.     Mitral valve replacement with a mechanical valve.  ------------------------------------------------------------------- Study data:  Comparison was made to the study of 05/28/2016.  Study status:  Routine.  Study completion:  There were no complications.         Transthoracic echocardiography.  M-mode,  complete 2D, spectral Doppler, and color Doppler.  Birthdate:  Patient birthdate: October 15, 1950.  Age:  Patient is 66 yr old.  Sex:  Gender: female.    BMI: 24.9 kg/m^2.  Blood pressure:     136/61  Patient status:  Outpatient.  Study date:  Study date: 08/18/2016. Study time: 10:28 AM.  Location:  [facility]cho laboratory.  -------------------------------------------------------------------  ------------------------------------------------------------------- Left ventricle:  The cavity size was normal. Systolic function was mildly reduced. The estimated ejection fraction was in the range of 45% to 50%. Diffuse hypokinesis.  ------------------------------------------------------------------- Aortic valve:   Moderately thickened, moderately calcified leaflets. Cusp separation was reduced.  Doppler:  Transvalvular velocity was within the normal range. There was no stenosis. There was no regurgitation.  ------------------------------------------------------------------- Mitral valve:  A mechanical prosthesis was present. Well seated and functioning normally.  Doppler:     Valve area by pressure half-time: 4.23 cm^2. Indexed valve area by pressure half-time: 2.56 cm^2/m^2. Valve area by continuity equation (using LVOT flow): 0.6 cm^2. Indexed valve area by continuity equation (using LVOT flow): 0.36 cm^2/m^2.    Mean gradient (D): 4 mm Hg. Peak gradient (D): 9 mm Hg.  ------------------------------------------------------------------- Left  atrium:  The atrium was moderately dilated.  ------------------------------------------------------------------- Right ventricle:  The cavity size was mildly dilated. Wall thickness was normal. Systolic function was normal.  ------------------------------------------------------------------- Ventricular septum:   The contour showed diastolic flattening.  ------------------------------------------------------------------- Pulmonic valve:   Poorly  visualized.  Structurally normal valve. Cusp separation was normal.  Doppler:  Transvalvular velocity was within the normal range. There was no regurgitation.  ------------------------------------------------------------------- Tricuspid valve:   Structurally normal valve.   Leaflet separation was normal.  Doppler:  Transvalvular velocity was within the normal range. There was mild regurgitation.  ------------------------------------------------------------------- Pulmonary artery:   Systolic pressure was mildly increased.  ------------------------------------------------------------------- Right atrium:  The atrium was severely dilated.  ------------------------------------------------------------------- Pericardium:  The pericardium was normal in appearance. There was no pericardial effusion.  ------------------------------------------------------------------- Systemic veins: Inferior vena cava: The vessel was dilated. The respirophasic diameter changes were blunted (< 50%), consistent with elevated central venous pressure.  ------------------------------------------------------------------- Measurements   Left ventricle                           Value          Reference  LV ID, ED, PLAX chordal           (L)    33.9  mm       43 - 52  LV ID, ES, PLAX chordal                  28.2  mm       23 - 38  LV fx shortening, PLAX chordal    (L)    17    %        >=29  LV PW thickness, ED                      13.7  mm       ----------  IVS/LV PW ratio, ED                      0.78           <=1.3  Stroke volume, 2D                        16    ml       ----------  Stroke volume/bsa, 2D                    10    ml/m^2   ----------  LV ejection fraction, 1-p A4C            44    %        ----------  LV end-diastolic volume, 2-p             61    ml       ----------  LV end-systolic volume, 2-p              37    ml       ----------  LV ejection fraction, 2-p                39     %        ----------  Stroke volume, 2-p                       24    ml       ----------  LV end-diastolic volume/bsa, 2-p         37    ml/m^2   ----------  LV end-systolic volume/bsa, 2-p          23    ml/m^2   ----------  Stroke volume/bsa, 2-p                   14.4  ml/m^2   ----------  Longitudinal strain, TDI                 12    %        ----------    Ventricular septum                       Value          Reference  IVS thickness, ED                        10.7  mm       ----------    LVOT                                     Value          Reference  LVOT ID, S                               13    mm       ----------  LVOT area                                1.33  cm^2     ----------  LVOT peak velocity, S                    67.8  cm/s     ----------  LVOT mean velocity, S                    45.2  cm/s     ----------  LVOT VTI, S                              11.8  cm       ----------    Aorta                                    Value          Reference  Aortic root ID, ED                       25    mm       ----------    Left atrium                              Value          Reference  LA ID, A-P, ES                           59    mm       ----------  LA ID/bsa, A-P                    (H)    3.57  cm/m^2   <=2.2  LA volume, S                             76    ml       ----------  LA volume/bsa, S                         45.9  ml/m^2   ----------  LA volume, ES, 1-p A4C                   62.4  ml       ----------  LA volume/bsa, ES, 1-p A4C               37.7  ml/m^2   ----------  LA volume, ES, 1-p A2C                   91.5  ml       ----------  LA volume/bsa, ES, 1-p A2C               55.3  ml/m^2   ----------    Mitral valve                             Value          Reference  Mitral E-wave peak velocity              146   cm/s     ----------  Mitral mean velocity, D                  81.6  cm/s     ----------  Mitral deceleration time                 158   ms       150  - 230  Mitral pressure half-time                48    ms       ----------  Mitral mean gradient, D                  4     mm Hg    ----------  Mitral peak gradient, D                  9     mm Hg    ----------  Mitral valve area, PHT, DP               4.23  cm^2     ----------  Mitral valve area/bsa, PHT, DP           2.56  cm^2/m^2 ----------  Mitral valve area, LVOT                  0.6   cm^2     ----------  continuity  Mitral valve area/bsa, LVOT              0.36  cm^2/m^2 ----------  continuity  Mitral annulus VTI, D                    26    cm       ----------  Pulmonary arteries                       Value          Reference  PA pressure, S, DP                (H)    42    mm Hg    <=30    Tricuspid valve                          Value          Reference  Tricuspid regurg peak velocity           258   cm/s     ----------  Tricuspid peak RV-RA gradient            27    mm Hg    ----------    Right atrium                             Value          Reference  RA ID, S-I, ES, A4C               (H)    57.5  mm       34 - 49  RA area, ES, A4C                  (H)    21.1  cm^2     8.3 - 19.5  RA volume, ES, A/L                       65    ml       ----------  RA volume/bsa, ES, A/L                   39.3  ml/m^2   ----------    Systemic veins                           Value          Reference  Estimated CVP                            15    mm Hg    ----------    Right ventricle                          Value          Reference  TAPSE                                    9.12  mm       ----------  RV pressure, S, DP                (H)    42    mm Hg    <=30  RV s&', lateral, S                        6.09  cm/s     ----------  Legend: (L)  and  (H)  mark values outside specified reference range.  -------------------------------------------------------------------  Prepared and Electronically Authenticated by  Candee Furbish, M.D. 2018-04-11T14:38:41   Impression:  Patient  is doing very well approximately 3 months status post mitral valve replacement using a bileaflet mechanical prosthetic valve, coronary artery bypass grafting 1, and Maze procedure. She is maintaining sinus rhythm and reports dramatic symptomatic improvement with no residual symptoms of shortness of breath or fatigue. She states that she only gets tired or shortness breath with really strenuous physical exertion, consistent with New York Heart Association functional class I combined systolic and diastolic congestive heart failure. She is taking her Coumadin as directed, although her last 2 prothrombin time checked have been subtherapeutic. This is being addressed through the Coumadin clinic.   Plan:  We have not recommended any changes to the patient's current medications. She will continue to gradually increase her physical activity without any particular limitations. She understands how important it is for her to get her Coumadin level regulated as soon as possible.  The patient has been reminded regarding the importance of dental hygiene and the lifelong need for antibiotic prophylaxis for all dental cleanings and other related invasive procedures, although the patient now is edentulous.  She will continue to follow-up with [name]. She will be seen in the atrial fibrillation clinic for routine surveillance following Maze procedure in approximately 3 months. She will return to our office for routine follow-up next January, approximately 1 year following her surgery. All of her questions have been addressed.  I spent in excess of 15 minutes during the conduct of this office consultation and >50% of this time involved direct face-to-face encounter with the patient for counseling and/or coordination of their care.    Valentina Gu. Roxy Manns, MD 08/30/2016 10:43 AM

## 2016-09-10 ENCOUNTER — Ambulatory Visit (INDEPENDENT_AMBULATORY_CARE_PROVIDER_SITE_OTHER): Payer: BC Managed Care – PPO | Admitting: Pharmacist Clinician (PhC)/ Clinical Pharmacy Specialist

## 2016-09-10 DIAGNOSIS — I4891 Unspecified atrial fibrillation: Secondary | ICD-10-CM | POA: Diagnosis not present

## 2016-09-10 DIAGNOSIS — I480 Paroxysmal atrial fibrillation: Secondary | ICD-10-CM | POA: Diagnosis not present

## 2016-09-10 LAB — POCT INR: INR: 2

## 2016-09-24 ENCOUNTER — Ambulatory Visit (INDEPENDENT_AMBULATORY_CARE_PROVIDER_SITE_OTHER): Payer: BC Managed Care – PPO | Admitting: Pharmacist

## 2016-09-24 DIAGNOSIS — I4891 Unspecified atrial fibrillation: Secondary | ICD-10-CM

## 2016-09-24 DIAGNOSIS — I480 Paroxysmal atrial fibrillation: Secondary | ICD-10-CM

## 2016-09-24 LAB — POCT INR: INR: 1.7

## 2016-10-08 ENCOUNTER — Ambulatory Visit (INDEPENDENT_AMBULATORY_CARE_PROVIDER_SITE_OTHER): Payer: BC Managed Care – PPO | Admitting: Pharmacist

## 2016-10-08 DIAGNOSIS — I4891 Unspecified atrial fibrillation: Secondary | ICD-10-CM

## 2016-10-08 DIAGNOSIS — I480 Paroxysmal atrial fibrillation: Secondary | ICD-10-CM

## 2016-10-08 LAB — POCT INR: INR: 1.6

## 2016-10-08 NOTE — Addendum Note (Signed)
Addendum  created 10/08/16 0911 by Effie Berkshire, MD   Sign clinical note

## 2016-10-22 ENCOUNTER — Ambulatory Visit (INDEPENDENT_AMBULATORY_CARE_PROVIDER_SITE_OTHER): Payer: BC Managed Care – PPO | Admitting: Pharmacist Clinician (PhC)/ Clinical Pharmacy Specialist

## 2016-10-22 DIAGNOSIS — I4891 Unspecified atrial fibrillation: Secondary | ICD-10-CM

## 2016-10-22 DIAGNOSIS — I480 Paroxysmal atrial fibrillation: Secondary | ICD-10-CM | POA: Diagnosis not present

## 2016-10-22 LAB — POCT INR: INR: 2

## 2016-10-22 MED ORDER — WARFARIN SODIUM 4 MG PO TABS: ORAL_TABLET | ORAL | 1 refills | Status: DC

## 2016-11-04 ENCOUNTER — Encounter: Payer: Self-pay | Admitting: Family

## 2016-11-04 ENCOUNTER — Ambulatory Visit (INDEPENDENT_AMBULATORY_CARE_PROVIDER_SITE_OTHER): Payer: BC Managed Care – PPO | Admitting: Family

## 2016-11-04 VITALS — BP 167/86 | HR 78 | Temp 98.0°F | Resp 16 | Ht 62.5 in | Wt 147.0 lb

## 2016-11-04 DIAGNOSIS — E785 Hyperlipidemia, unspecified: Secondary | ICD-10-CM

## 2016-11-04 DIAGNOSIS — I10 Essential (primary) hypertension: Secondary | ICD-10-CM

## 2016-11-04 DIAGNOSIS — F329 Major depressive disorder, single episode, unspecified: Secondary | ICD-10-CM | POA: Diagnosis not present

## 2016-11-04 DIAGNOSIS — I509 Heart failure, unspecified: Secondary | ICD-10-CM | POA: Diagnosis not present

## 2016-11-04 DIAGNOSIS — F32A Depression, unspecified: Secondary | ICD-10-CM

## 2016-11-04 DIAGNOSIS — R002 Palpitations: Secondary | ICD-10-CM | POA: Diagnosis not present

## 2016-11-04 LAB — COMPREHENSIVE METABOLIC PANEL
ALBUMIN: 3.8 g/dL (ref 3.5–5.2)
ALK PHOS: 163 U/L — AB (ref 39–117)
ALT: 41 U/L — AB (ref 0–35)
AST: 54 U/L — ABNORMAL HIGH (ref 0–37)
BILIRUBIN TOTAL: 1 mg/dL (ref 0.2–1.2)
BUN: 13 mg/dL (ref 6–23)
CALCIUM: 9.3 mg/dL (ref 8.4–10.5)
CO2: 25 meq/L (ref 19–32)
Chloride: 107 mEq/L (ref 96–112)
Creatinine, Ser: 0.87 mg/dL (ref 0.40–1.20)
GFR: 84.08 mL/min (ref >60.00)
Glucose, Bld: 78 mg/dL (ref 70–99)
Potassium: 3.8 mEq/L (ref 3.5–5.1)
Sodium: 140 mEq/L (ref 135–145)
Total Protein: 7.9 g/dL (ref 6.0–8.3)

## 2016-11-04 LAB — CBC WITH DIFFERENTIAL/PLATELET
Basophils Absolute: 0.1 10*3/uL (ref 0.0–0.1)
Basophils Relative: 1.9 % (ref 0.0–3.0)
EOS PCT: 1.2 % (ref 0.0–5.0)
Eosinophils Absolute: 0 10*3/uL (ref 0.0–0.7)
HEMATOCRIT: 32.7 % — AB (ref 36.0–46.0)
Hemoglobin: 10.6 g/dL — ABNORMAL LOW (ref 12.0–15.0)
LYMPHS ABS: 1.1 10*3/uL (ref 0.7–4.0)
Lymphocytes Relative: 31.1 % (ref 12.0–46.0)
MCHC: 32.3 g/dL (ref 30.0–36.0)
MCV: 84.8 fl (ref 78.0–100.0)
MONOS PCT: 14.2 % — AB (ref 3.0–12.0)
Monocytes Absolute: 0.5 10*3/uL (ref 0.1–1.0)
NEUTROS ABS: 1.9 10*3/uL (ref 1.4–7.7)
Neutrophils Relative %: 51.6 % (ref 43.0–77.0)
PLATELETS: 128 10*3/uL — AB (ref 150.0–400.0)
RBC: 3.85 Mil/uL — ABNORMAL LOW (ref 3.87–5.11)
RDW: 18.4 % — ABNORMAL HIGH (ref 11.5–15.5)
WBC: 3.7 10*3/uL — ABNORMAL LOW (ref 4.0–10.5)

## 2016-11-04 MED ORDER — FUROSEMIDE 20 MG PO TABS: ORAL_TABLET | ORAL | 1 refills | Status: DC

## 2016-11-04 MED ORDER — METOPROLOL SUCCINATE ER 50 MG PO TB24: 50.0000 mg | ORAL_TABLET | Freq: Every day | ORAL | 2 refills | Status: DC

## 2016-11-04 NOTE — Progress Notes (Signed)
Subjective:    Patient ID: Marie Malone, female    DOB: 04-04-1951, 66 y.o.   MRN: [id number]  HPI  Marie Malone is a 66 yr old female who presents today for follow up.  1) HTN- She is maintained on metoprolol one tablet once daily (25mg ).   BP Readings from Last 3 Encounters:  11/04/16 (!) 167/86  08/30/16 (!) 164/94  08/18/16 130/79   2) Hyperlipidemia-  Denies myalgia.   Lab Results  Component Value Date   CHOL 107 07/07/2016   HDL 44.00 07/07/2016   LDLCALC 48 07/07/2016   TRIG 72.0 07/07/2016   CHOLHDL 2 07/07/2016   3) Depression- notes significant worry about her medical bills.  4) Palpitations- pt is s/p Mitral Valve Replacement with mechanical valve + cabg x 1 and maze procedure.  She reports that she has been having rapid heart rate and DOE which resolves with rest. Began 1-2 weeks ago.  Denies significant swelling. She reports 4-5 pound weight gain over the last 1 week per her weights at rehab.    Wt Readings from Last 3 Encounters:  11/04/16 147 lb (66.7 kg)  08/30/16 141 lb (64 kg)  08/18/16 141 lb 6.4 oz (64.1 kg)     Review of Systems See HPI   Past Medical History:  Diagnosis Date  . Atrial fibrillation ([place])   . Chronic diastolic CHF (congestive heart failure) ([place])   . Complication of anesthesia    difficult to wake up from anesthesia  . Coronary artery disease involving native coronary artery without angina pectoris 03/26/2016   100% chronic occlusion of RCA  . Dyspnea   . Heart murmur   . History of rheumatic fever    as a child  . Hyperlipidemia   . Hypertension   . Mitral stenosis   . OSA (obstructive sleep apnea)    mild per sleep study 2008  . Paroxysmal atrial fibrillation ([facility])   . Pneumonia    double pneumonia once  . Pulmonary hypertension ([place])   . Rheumatic mitral regurgitation   . S/P balloon mitral valvuloplasty 2008   . 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 S/P mitral valve replacement with  mechanical valve 05/28/2016   29 mm Sorin [employer]omed[employer] Optiform bileaflet mechanical prosthetic valve  . Stroke [facility])    x2     Social History   Social History  . Marital status: Married    Spouse name: N/A  . Number of children: 2  . Years of education: Associates   Occupational History  . Not on file.   Social History Main Topics  . Smoking status: Former Smoker    Packs/day: 1.00    Years: 5.00    Types: Cigarettes    Start date: 07/15/1970    Quit date: 05/11/1975  . Smokeless tobacco: Never Used     Comment: quit smoking 36 years ago  . Alcohol use No     Comment: drank years ago when young  . Drug use: No  . Sexual activity: Not on file   Other Topics Concern  . Not on file   Social History Narrative   Retired Archivist   Married   She has 2 grown children-   Daughter lives in [place]   Son lives in [place]   6 grandchildren        Past Surgical History:  Procedure Laterality Date  . APPENDECTOMY  1989  appendix ruptured,had peritonitis  . BALLOON VALVULOPLASTY  2008   [facility]  . CARDIAC CATHETERIZATION N/A 03/25/2016   Procedure: Right/Left Heart Cath and Coronary Angiography;  Surgeon: Jettie Booze, MD;  Location: [place] CV LAB;  Service: Cardiovascular;  Laterality: N/A;  . COLONOSCOPY W/ POLYPECTOMY    . CORONARY ARTERY BYPASS GRAFT N/A 05/28/2016   Procedure: CORONARY ARTERY BYPASS GRAFTING (CABG) x 1 WITH ENDOSCOPIC HARVESTING OF RIGHT SAPHENOUS VEIN, EVH- PDA;  Surgeon: Rexene Alberts, MD;  Location: [place];  Service: Open Heart Surgery;  Laterality: N/A;  . ENDARTERECTOMY Right 03/10/2016   Procedure: ENDARTERECTOMY CAROTID RIGHT;  Surgeon: Serafina Mitchell, MD;  Location: [place];  Service: Vascular;  Laterality: Right;  . EP IMPLANTABLE DEVICE N/A 03/11/2016   Procedure: Loop Recorder Removal;  Surgeon: Deboraha Sprang, MD;  Location: [place] CV LAB;  Service: Cardiovascular;  Laterality: N/A;  . LOOP RECORDER IMPLANT   04-10-2013   MDT LinQ implanted by Dr Rayann Heman for cryptogenic stroke  . LOOP RECORDER IMPLANT N/A 04/10/2013   Procedure: LOOP RECORDER IMPLANT;  Surgeon: Coralyn Mark, MD;  Location: [place] CATH LAB;  Service: Cardiovascular;  Laterality: N/A;  . MAZE N/A 05/28/2016   Procedure: MAZE PROCEDURE AND APPLICATION OF  ATRICLIP LAA PROCLIP II 78 MM;  Surgeon: Rexene Alberts, MD;  Location: [place];  Service: Open Heart Surgery;  Laterality: N/A;  . MITRAL VALVE REPLACEMENT N/A 05/28/2016   Procedure: MITRAL VALVE (MV) REPLACEMENT USING 29 MM CARBOMEDICS OPTIFORM MECHANICAL BILEAFLET PROSTHETIC HEART VALVE;  Surgeon: Rexene Alberts, MD;  Location: [place];  Service: Open Heart Surgery;  Laterality: N/A;  . MULTIPLE EXTRACTIONS WITH ALVEOLOPLASTY N/A 03/30/2016   Procedure: EXTRACTION OF TOOTH #'S 2,5,6,11,15,16,20-30 AND 32 WITH ALVEOLOPLASTY AND BILATERAL MAXILLARY LATERAL EXOSTOSES REDUCTIONS;  Surgeon: Lenn Cal, DDS;  Location: [place];  Service: Oral Surgery;  Laterality: N/A;  . PATCH ANGIOPLASTY Right 03/10/2016   Procedure: PATCH ANGIOPLASTY CAROTID RIGHT USING Rueben Bash BIOLOGIC PATCH;  Surgeon: Serafina Mitchell, MD;  Location: [place];  Service: Vascular;  Laterality: Right;  . TEE WITHOUT CARDIOVERSION N/A 04/10/2013   Procedure: TRANSESOPHAGEAL ECHOCARDIOGRAM (TEE);  Surgeon: [name], MD;  Location: [facility] END[facility];  Service: Cardiovascular;  Laterality: N/A;  . TEE WITHOUT CARDIOVERSION N/A 03/24/2016   Procedure: TRANSESOPHAGEAL ECHOCARDIOGRAM (TEE);  Surgeon: Jerline Pain, MD;  Location: [place];  Service: Cardiovascular;  Laterality: N/A;  . TEE WITHOUT CARDIOVERSION N/A 05/28/2016   Procedure: TRANSESOPHAGEAL ECHOCARDIOGRAM (TEE);  Surgeon: Rexene Alberts, MD;  Location: [place];  Service: Open Heart Surgery;  Laterality: N/A;  . TUBAL LIGATION      Family History  Problem Relation Age of Onset  . Diabetes Mother   . Stroke Mother   . Diabetes Father   . Heart disease Father         CHF  . Cancer Father        kidney  . Diabetes Sister   . Diabetes Brother   . Hypertension Daughter     Allergies  Allergen Reactions  . No Known Allergies     Current Outpatient Prescriptions on File Prior to Visit  Medication Sig Dispense Refill  . aspirin EC 81 MG EC tablet Take 1 tablet (81 mg total) by mouth daily.    [name] Kitchen atorvastatin (LIPITOR) 80 MG tablet TAKE ONE TABLET BY MOUTH ONCE DAILY AT 6 PM 30 tablet 5  . chlorhexidine (PERIDEX) 0.12 % solution Rinse with 15  mLs bid after breakfast and at bedtime.  Use in a swish and spit manner. 480 mL prn  . docusate sodium (COLACE) 100 MG capsule Take 1 capsule (100 mg total) by mouth 2 (two) times daily. 10 capsule 0  . feeding supplement (BOOST / RESOURCE BREEZE) LIQD Take 1 Container by mouth 2 (two) times daily between meals.    . metoprolol succinate (TOPROL-XL) 25 MG 24 hr tablet TAKE ONE TABLET BY MOUTH ONCE DAILY 30 tablet 5  . polyethylene glycol (MIRALAX / GLYCOLAX) packet Take 17 g by mouth daily. (Patient taking differently: Take 17 g by mouth daily as needed. ) 14 each 0  . warfarin (COUMADIN) 4 MG tablet Take 1 to 1.5 tablets by mouth daily as directed by coumadin clinic 40 tablet 1   No current facility-administered medications on file prior to visit.     BP (!) 167/86 (BP Location: Left Arm, Cuff Size: Normal)   Pulse 78   Temp 98 F (36.7 C) (Oral)   Resp 16   Ht 5' 2.5" (1.588 m)   Wt 147 lb (66.7 kg)   SpO2 99%   BMI 26.46 kg/m       Objective:   Physical Exam  Constitutional: She appears well-developed and well-nourished.  Cardiovascular: Normal rate and regular rhythm.   No murmur heard. Pulmonary/Chest: Effort normal and breath sounds normal. No respiratory distress. She has no wheezes.  Musculoskeletal:  Trace LE edema  Psychiatric: She has a normal mood and affect. Her behavior is normal. Judgment and thought content normal.          Assessment & Plan:  DOE/palpitations- likely due  to mild CHF exacerbation-  Check CBC and CMET.  EKG tracing is personally reviewed.  EKG notes NSR.  No acute changes. She reports 4-5 pound weight gain recently.  Will begin lasix 20mg  once daily x 3 days. Plan to bring her back on Monday.    Depression- I think she is having some situational depression due to her health. She scored 17 on PHQ-9. She declines counseling or medication at this time.  HTN- uncontrolled. Increase toprol xl from 25mg  to 50mg .   Hyperlipidemia- at goal on statin, continue same.

## 2016-11-04 NOTE — Patient Instructions (Signed)
Please increase toprol xl from 25mg  to 50mg .   Begin lasix (furosemide) a fluid pill once daily for the next 3 days.

## 2016-11-05 ENCOUNTER — Telehealth: Payer: Self-pay | Admitting: Family

## 2016-11-05 DIAGNOSIS — R748 Abnormal levels of other serum enzymes: Secondary | ICD-10-CM

## 2016-11-05 DIAGNOSIS — R945 Abnormal results of liver function studies: Secondary | ICD-10-CM

## 2016-11-05 DIAGNOSIS — D649 Anemia, unspecified: Secondary | ICD-10-CM

## 2016-11-05 DIAGNOSIS — R7989 Other specified abnormal findings of blood chemistry: Secondary | ICD-10-CM

## 2016-11-05 NOTE — Telephone Encounter (Signed)
She is anemic, I would like her to complete and IFOB as well as some additional blood work.  Also, lft's elevated and I would like her to complete an abdominal US.

## 2016-11-08 ENCOUNTER — Ambulatory Visit (INDEPENDENT_AMBULATORY_CARE_PROVIDER_SITE_OTHER): Payer: BC Managed Care – PPO | Admitting: Family

## 2016-11-08 ENCOUNTER — Other Ambulatory Visit: Payer: Self-pay | Admitting: Emergency Medicine

## 2016-11-08 ENCOUNTER — Encounter: Payer: Self-pay | Admitting: Family

## 2016-11-08 VITALS — BP 157/64 | HR 76 | Temp 97.8°F | Resp 18 | Ht 62.5 in | Wt 148.0 lb

## 2016-11-08 DIAGNOSIS — R748 Abnormal levels of other serum enzymes: Secondary | ICD-10-CM

## 2016-11-08 DIAGNOSIS — R945 Abnormal results of liver function studies: Secondary | ICD-10-CM

## 2016-11-08 DIAGNOSIS — I5032 Chronic diastolic (congestive) heart failure: Secondary | ICD-10-CM

## 2016-11-08 DIAGNOSIS — D649 Anemia, unspecified: Secondary | ICD-10-CM

## 2016-11-08 DIAGNOSIS — R7989 Other specified abnormal findings of blood chemistry: Secondary | ICD-10-CM

## 2016-11-08 DIAGNOSIS — I10 Essential (primary) hypertension: Secondary | ICD-10-CM

## 2016-11-08 LAB — BASIC METABOLIC PANEL
BUN: 19 mg/dL (ref 6–23)
CHLORIDE: 105 meq/L (ref 96–112)
CO2: 27 mEq/L (ref 19–32)
CREATININE: 0.86 mg/dL (ref 0.40–1.20)
Calcium: 9.5 mg/dL (ref 8.4–10.5)
GFR: 85.2 mL/min (ref >60.00)
Glucose, Bld: 117 mg/dL — ABNORMAL HIGH (ref 70–99)
Potassium: 3.7 mEq/L (ref 3.5–5.1)
Sodium: 139 mEq/L (ref 135–145)

## 2016-11-08 MED ORDER — FUROSEMIDE 40 MG PO TABS: 40.0000 mg | ORAL_TABLET | Freq: Every day | ORAL | 3 refills | Status: DC

## 2016-11-08 NOTE — Patient Instructions (Signed)
Please complete lab work prior to leaving. We will increase your lasix from 20mg  to 40mg  once daily. You should be contacted about scheduling follow up with cardiology.

## 2016-11-08 NOTE — Progress Notes (Signed)
Subjective:    Patient ID: Marie Malone, female    DOB: 05-17-1950, 66 y.o.   MRN: [id number]  HPI  Marie Malone is a 66 yr old female who presents today for follow up.   HTN- Last visit her toprol xl was increased from 25mg  to 50mg .  BP Readings from Last 3 Encounters:  11/08/16 (!) 157/64  11/04/16 (!) 167/86  08/30/16 (!) 164/94   CHF- last visit we started lasix 20mg  once daily x 3 days.  Since that time she has gained 1 pound.  She did note that she had some diarrhea.  Not sure if it was due to the lasix but it has since stopped.  Wt Readings from Last 3 Encounters:  11/08/16 148 lb (67.1 kg)  11/04/16 147 lb (66.7 kg)  08/30/16 141 lb (64 kg)    She reports + DOE.  She was feels like the SOB is about the same as it was last week. She was able to participate in her cardiac rehab this AM.    Review of Systems See HPI  Past Medical History:  Diagnosis Date  . Atrial fibrillation ([place])   . Chronic diastolic CHF (congestive heart failure) ([place])   . Complication of anesthesia    difficult to wake up from anesthesia  . Coronary artery disease involving native coronary artery without angina pectoris 03/26/2016   100% chronic occlusion of RCA  . Dyspnea   . Heart murmur   . History of rheumatic fever    as a child  . Hyperlipidemia   . Hypertension   . Mitral stenosis   . OSA (obstructive sleep apnea)    mild per sleep study 2008  . Paroxysmal atrial fibrillation ([facility])   . Pneumonia    double pneumonia once  . Pulmonary hypertension ([place])   . Rheumatic mitral regurgitation   . S/P balloon mitral valvuloplasty 2008   . 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 S/P mitral valve replacement with mechanical valve 05/28/2016   29 mm Sorin Carbomedics Optiform bileaflet mechanical prosthetic valve  . Stroke [facility])    x2     Social History   Social History  . Marital status: Married    Spouse name: N/A  . Number of children: 2  . Years of  education: Associates   Occupational History  . Not on file.   Social History Main Topics  . Smoking status: Former Smoker    Packs/day: 1.00    Years: 5.00    Types: Cigarettes    Start date: 07/15/1970    Quit date: 05/11/1975  . Smokeless tobacco: Never Used     Comment: quit smoking 36 years ago  . Alcohol use No     Comment: drank years ago when young  . Drug use: No  . Sexual activity: Not on file   Other Topics Concern  . Not on file   Social History Narrative   Retired Archivist   Married   She has 2 grown children-   Daughter lives in [place]   Son lives in [place]   6 grandchildren        Past Surgical History:  Procedure Laterality Date  . APPENDECTOMY  1989   appendix ruptured,had peritonitis  . BALLOON VALVULOPLASTY  2008   [facility]  . CARDIAC CATHETERIZATION N/A 03/25/2016   Procedure: Right/Left Heart Cath and Coronary Angiography;  Surgeon: Jettie Booze, MD;  Location: [place] [facility];  Service: Cardiovascular;  Laterality: N/A;  . COLONOSCOPY W/ POLYPECTOMY    . CORONARY ARTERY BYPASS GRAFT N/A 05/28/2016   Procedure: CORONARY ARTERY BYPASS GRAFTING (CABG) x 1 WITH ENDOSCOPIC HARVESTING OF RIGHT SAPHENOUS VEIN, EVH- PDA;  Surgeon: Rexene Alberts, MD;  Location: [place];  Service: Open Heart Surgery;  Laterality: N/A;  . ENDARTERECTOMY Right 03/10/2016   Procedure: ENDARTERECTOMY CAROTID RIGHT;  Surgeon: Serafina Mitchell, MD;  Location: [place];  Service: Vascular;  Laterality: Right;  . EP IMPLANTABLE DEVICE N/A 03/11/2016   Procedure: Loop Recorder Removal;  Surgeon: Deboraha Sprang, MD;  Location: [place] CV [facility];  Service: Cardiovascular;  Laterality: N/A;  . 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 LOOP RECORDER IMPLANT N/A 04/10/2013   Procedure: LOOP RECORDER IMPLANT;  Surgeon: Coralyn Mark, MD;  Location: [place] [facility];  Service: Cardiovascular;  Laterality: N/A;  . MAZE N/A  05/28/2016   Procedure: MAZE PROCEDURE AND APPLICATION OF  ATRICLIP LAA PROCLIP II 81 MM;  Surgeon: Rexene Alberts, MD;  Location: [facility];  Service: Open Heart Surgery;  Laterality: N/A;  . MITRAL VALVE REPLACEMENT N/A 05/28/2016   Procedure: MITRAL VALVE (MV) REPLACEMENT USING 29 MM CARBOMEDICS OPTIFORM MECHANICAL BILEAFLET PROSTHETIC HEART VALVE;  Surgeon: Rexene Alberts, MD;  Location: [place];  Service: Open Heart Surgery;  Laterality: N/A;  . MULTIPLE EXTRACTIONS WITH ALVEOLOPLASTY N/A 03/30/2016   Procedure: EXTRACTION OF TOOTH #'S 2,5,6,11,15,16,20-30 AND 32 WITH ALVEOLOPLASTY AND BILATERAL MAXILLARY LATERAL EXOSTOSES REDUCTIONS;  Surgeon: Lenn Cal, DDS;  Location: [place];  Service: Oral Surgery;  Laterality: N/A;  . PATCH ANGIOPLASTY Right 03/10/2016   Procedure: PATCH ANGIOPLASTY CAROTID RIGHT USING Rueben Bash BIOLOGIC PATCH;  Surgeon: Serafina Mitchell, MD;  Location: [facility];  Service: Vascular;  Laterality: Right;  . TEE WITHOUT CARDIOVERSION N/A 04/10/2013   Procedure: TRANSESOPHAGEAL ECHOCARDIOGRAM (TEE);  Surgeon: [name], MD;  Location: [facility] ENDOSCOPY;  Service: Cardiovascular;  Laterality: N/A;  . TEE WITHOUT CARDIOVERSION N/A 03/24/2016   Procedure: TRANSESOPHAGEAL ECHOCARDIOGRAM (TEE);  Surgeon: Jerline Pain, MD;  Location: [place];  Service: Cardiovascular;  Laterality: N/A;  . TEE WITHOUT CARDIOVERSION N/A 05/28/2016   Procedure: TRANSESOPHAGEAL ECHOCARDIOGRAM (TEE);  Surgeon: Rexene Alberts, MD;  Location: [place];  Service: Open Heart Surgery;  Laterality: N/A;  . TUBAL LIGATION      Family History  Problem Relation Age of Onset  . Diabetes Mother   . Stroke Mother   . Diabetes Father   . Heart disease Father        CHF  . Cancer Father        kidney  . Diabetes Sister   . Diabetes Brother   . Hypertension Daughter     Allergies  Allergen Reactions  . No Known Allergies     Current Outpatient Prescriptions on File Prior to Visit  Medication Sig  Dispense Refill  . aspirin EC 81 MG EC tablet Take 1 tablet (81 mg total) by mouth daily.    [name] Kitchen atorvastatin (LIPITOR) 80 MG tablet TAKE ONE TABLET BY MOUTH ONCE DAILY AT 6 PM 30 tablet 5  . chlorhexidine (PERIDEX) 0.12 % solution Rinse with 15 mLs bid after breakfast and at bedtime.  Use in a swish and spit manner. 480 mL prn  . docusate sodium (COLACE) 100 MG capsule Take 1 capsule (100 mg total) by mouth  2 (two) times daily. 10 capsule 0  . feeding supplement (BOOST / RESOURCE BREEZE) LIQD Take 1 Container by mouth 2 (two) times daily between meals.    . furosemide (LASIX) 20 MG tablet 1 tablet by mouth once daily as needed 30 tablet 1  . metoprolol succinate (TOPROL-XL) 50 MG 24 hr tablet Take 1 tablet (50 mg total) by mouth daily. Take with or immediately following a meal. 30 tablet 2  . polyethylene glycol (MIRALAX / GLYCOLAX) packet Take 17 g by mouth daily. (Patient taking differently: Take 17 g by mouth daily as needed. ) 14 each 0  . warfarin (COUMADIN) 4 MG tablet Take 1 to 1.5 tablets by mouth daily as directed by coumadin clinic 40 tablet 1   No current facility-administered medications on file prior to visit.     BP (!) 157/64 (BP Location: Left Arm, Cuff Size: Normal)   Pulse 76   Temp 97.8 F (36.6 C) (Oral)   Resp 18   Ht 5' 2.5" (1.588 m)   Wt 148 lb (67.1 kg)   SpO2 100%   BMI 26.64 kg/m       Objective:   Physical Exam  Constitutional: She is oriented to person, place, and time. She appears well-developed and well-nourished.  HENT:  Head: Normocephalic and atraumatic.  Cardiovascular: Normal rate and regular rhythm.   No murmur heard. 1+ bilateral LE edema + mechanical valve click noted on exam  Pulmonary/Chest: Effort normal and breath sounds normal. No respiratory distress. She has no wheezes.  Musculoskeletal: She exhibits no edema.  Neurological: She is alert and oriented to person, place, and time.  Skin: Skin is warm and dry.  Psychiatric: She has a  normal mood and affect. Her behavior is normal. Judgment and thought content normal.          Assessment & Plan:

## 2016-11-08 NOTE — Assessment & Plan Note (Signed)
BP is bit better today.  Continue current dose of toprol. Increasing dose of lasix will also help with her blood pressure.

## 2016-11-08 NOTE — Assessment & Plan Note (Signed)
She has gained 1 pound despite lasix 20mg  x 3 days. I spoke with her cardiologist- [name] and he recommended lasix 40mg  once daily with a follow up bmet in 1 week. He also recommended follow up with cardiology and I have sent a note to his nurse requesting that this be arranged.

## 2016-11-08 NOTE — Addendum Note (Signed)
Addended by: Kelle Darting A on: 11/08/2016 02:15 PM   Modules accepted: Orders

## 2016-11-08 NOTE — Addendum Note (Signed)
Addended by: Caffie Pinto on: 11/08/2016 02:44 PM   Modules accepted: Orders

## 2016-11-08 NOTE — Telephone Encounter (Signed)
Notified pt and she is agreeable to proceed with additional labs, IFOB and U/S. Orders signed and will be drawn today.

## 2016-11-09 ENCOUNTER — Ambulatory Visit (HOSPITAL_BASED_OUTPATIENT_CLINIC_OR_DEPARTMENT_OTHER)
Admission: RE | Admit: 2016-11-09 | Discharge: 2016-11-09 | Disposition: A | Discharge status: Home / Self Care | Payer: BC Managed Care – PPO | Source: Ambulatory Visit | Attending: Family | Admitting: Family

## 2016-11-09 ENCOUNTER — Telehealth: Payer: Self-pay | Admitting: *Deleted

## 2016-11-09 DIAGNOSIS — R7989 Other specified abnormal findings of blood chemistry: Secondary | ICD-10-CM | POA: Diagnosis present

## 2016-11-09 DIAGNOSIS — K828 Other specified diseases of gallbladder: Secondary | ICD-10-CM | POA: Diagnosis not present

## 2016-11-09 DIAGNOSIS — K819 Cholecystitis, unspecified: Secondary | ICD-10-CM

## 2016-11-09 LAB — HEPATITIS C ANTIBODY: HCV AB: NEGATIVE

## 2016-11-09 LAB — HEPATITIS PANEL, ACUTE
HCV AB: NEGATIVE
HEP A IGM: NONREACTIVE
HEP B C IGM: NONREACTIVE
Hepatitis B Surface Ag: NEGATIVE

## 2016-11-09 LAB — VITAMIN B12: VITAMIN B 12: 216 pg/mL (ref 211–911)

## 2016-11-09 LAB — IRON: IRON: 60 ug/dL (ref 42–145)

## 2016-11-09 LAB — FOLATE: FOLATE: 9.1 ng/mL (ref >5.9)

## 2016-11-09 NOTE — Telephone Encounter (Signed)
Received call from [place] in radiology letting us know that pt just completed u/s and is waiting in lobby for further instructions. Per verbal from PCP, referral will be placed to surgeon for cholecystitis. If pt develops severe abdominal pain or N/V she should go to the ER. Pt voices understanding. Referral placed.

## 2016-11-09 NOTE — Telephone Encounter (Signed)
Noted and agree. 

## 2016-11-11 LAB — VITAMIN D 1,25 DIHYDROXY
Vitamin D 1, 25 (OH)2 Total: 48 pg/mL (ref 18–72)
Vitamin D3 1, 25 (OH)2: 48 pg/mL

## 2016-11-12 ENCOUNTER — Encounter: Payer: Self-pay | Admitting: *Deleted

## 2016-11-12 ENCOUNTER — Encounter: Payer: Self-pay | Admitting: Physician Assistant

## 2016-11-12 ENCOUNTER — Ambulatory Visit (INDEPENDENT_AMBULATORY_CARE_PROVIDER_SITE_OTHER): Payer: BC Managed Care – PPO | Admitting: Pharmacist

## 2016-11-12 ENCOUNTER — Ambulatory Visit (INDEPENDENT_AMBULATORY_CARE_PROVIDER_SITE_OTHER): Payer: BC Managed Care – PPO | Admitting: Physician Assistant

## 2016-11-12 VITALS — BP 149/74 | HR 75 | Ht 62.5 in | Wt 143.0 lb

## 2016-11-12 DIAGNOSIS — I480 Paroxysmal atrial fibrillation: Secondary | ICD-10-CM | POA: Diagnosis not present

## 2016-11-12 DIAGNOSIS — G4733 Obstructive sleep apnea (adult) (pediatric): Secondary | ICD-10-CM

## 2016-11-12 DIAGNOSIS — I10 Essential (primary) hypertension: Secondary | ICD-10-CM

## 2016-11-12 DIAGNOSIS — I25810 Atherosclerosis of coronary artery bypass graft(s) without angina pectoris: Secondary | ICD-10-CM

## 2016-11-12 DIAGNOSIS — E785 Hyperlipidemia, unspecified: Secondary | ICD-10-CM | POA: Diagnosis not present

## 2016-11-12 DIAGNOSIS — I4891 Unspecified atrial fibrillation: Secondary | ICD-10-CM

## 2016-11-12 DIAGNOSIS — R0600 Dyspnea, unspecified: Secondary | ICD-10-CM

## 2016-11-12 DIAGNOSIS — Z952 Presence of prosthetic heart valve: Secondary | ICD-10-CM

## 2016-11-12 LAB — POCT INR: INR: 1.8

## 2016-11-12 MED ORDER — LOSARTAN POTASSIUM 25 MG PO TABS: 25.0000 mg | ORAL_TABLET | Freq: Every day | ORAL | 1 refills | Status: DC

## 2016-11-12 NOTE — Patient Instructions (Signed)
Medication Instructions:   CONTINUE current medications including lasix at same dosage.  START losartan 25mg  DAILY for blood pressure  Labwork:   Obtain BMET at primary care office on Monday  Testing/Procedures:  none  Follow-Up:  With [name] in 3 months  If you need a refill on your cardiac medications before your next appointment, please call your pharmacy.

## 2016-11-12 NOTE — Progress Notes (Signed)
Cardiology Office Note    Date:  11/13/2016   ID:  Marie Malone 23-Dec-1950, MRN [id number]  PCP:  Debbrah Alar, NP  Cardiologist:  [name]  Chief Complaint  Patient presents with  . Shortness of Breath    SHOB with activity. some recent swelling in legs, ankels, and feet. recent headaches. recent tiredness and fatigue.    History of Present Illness:  Marie Malone is a 66 y.o. female with PMH of atrial fibrillation, mitral stenosis with history of rheumatic fever s/p valvuloplasty at [facility] in 2008, coronary artery disease with 100% chronic occlusion of RCA s/p CABG x 1, hypertension, hyperlipidemia, history of pulmonary hypertension, mild OSA and CVA. Prior Loop recorder revealed paroxysmal atrial fibrillation and she was started on systemic anticoagulation therapy. She had carotid endarterectomy in November 2017. She was admitted with syncope during the same of and found to have Mobitz type I second-degree AV block. TEE obtained in November 2017 showed normal LV function and moderate to severe mitral stenosis, mild left atrial enlargement. Cardiac catheterization in November 2017 showed normal LV systolic function, moderate mitral stenosis with mean gradient of 10 mmHg, 100% RCA occlusion with left-to-right collaterals and moderate pulmonary hypertension. She had a mechanical MVR, CABG with SVG to PDA and MAZE in 05/2016.  She presents today for cardiology visit along with her husband. On physical exam, she still have a very mild murmur, however this does not suggest significant issue with her mechanical mitral valve. EKG showed sinus rhythm with prolonged first-degree AV block, no evidence of recurrent atrial fibrillation. Her INR has been subtherapeutic on the previous several jaws, she will be seen by Coumadin clinic today again. Her blood pressure is relatively elevated, I will start her on losartan 25 mg daily. She is due for carotid ultrasound at the end of this year. As  for her shortness of breath, previous cardiac catheterization prior to mitral valve replacement surgery showed 100% RCA occlusion but otherwise no significant coronary artery disease, she denies any recent chest pain. There is no indication for ischemic workup at this time unless symptoms worsen. She also does not appears to be volume overloaded. Echocardiogram obtained in April 2018 did show ejection fraction decreased at local. It also showed PA peak pressure in the 40s. I started her on losartan 25 mg daily. I recommended continue activity and exercise for now and observe symptom.   Past Medical History:  Diagnosis Date  . Atrial fibrillation ([place])   . Chronic diastolic CHF (congestive heart failure) ([place])   . Complication of anesthesia    difficult to wake up from anesthesia  . Coronary artery disease involving native coronary artery without angina pectoris 03/26/2016   100% chronic occlusion of RCA  . Dyspnea   . Heart murmur   . History of rheumatic fever    as a child  . Hyperlipidemia   . Hypertension   . Mitral stenosis   . OSA (obstructive sleep apnea)    mild per sleep study 2008  . Paroxysmal atrial fibrillation ([facility])   . Pneumonia    double pneumonia once  . Pulmonary hypertension ([place])   . Rheumatic mitral regurgitation   . S/P balloon mitral valvuloplasty 2008   . 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 S/P mitral valve replacement with mechanical valve 05/28/2016   29 mm Sorin Carbomedics Optiform bileaflet mechanical prosthetic valve  . Stroke [facility])  x2    Past Surgical History:  Procedure Laterality Date  . APPENDECTOMY  1989   appendix ruptured,had peritonitis  . BALLOON VALVULOPLASTY  2008   [facility]  . CARDIAC CATHETERIZATION N/A 03/25/2016   Procedure: Right/Left Heart Cath and Coronary Angiography;  Surgeon: Jettie Booze, MD;  Location: [place] CV LAB;  Service: Cardiovascular;  Laterality: N/A;  . COLONOSCOPY W/  POLYPECTOMY    . CORONARY ARTERY BYPASS GRAFT N/A 05/28/2016   Procedure: CORONARY ARTERY BYPASS GRAFTING (CABG) x 1 WITH ENDOSCOPIC HARVESTING OF RIGHT SAPHENOUS VEIN, EVH- PDA;  Surgeon: Rexene Alberts, MD;  Location: [place];  Service: Open Heart Surgery;  Laterality: N/A;  . ENDARTERECTOMY Right 03/10/2016   Procedure: ENDARTERECTOMY CAROTID RIGHT;  Surgeon: Serafina Mitchell, MD;  Location: [place];  Service: Vascular;  Laterality: Right;  . EP IMPLANTABLE DEVICE N/A 03/11/2016   Procedure: Loop Recorder Removal;  Surgeon: Deboraha Sprang, MD;  Location: [place] CV LAB;  Service: Cardiovascular;  Laterality: N/A;  . 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 LOOP RECORDER IMPLANT N/A 04/10/2013   Procedure: LOOP RECORDER IMPLANT;  Surgeon: Coralyn Mark, MD;  Location: [place] CATH LAB;  Service: Cardiovascular;  Laterality: N/A;  . MAZE N/A 05/28/2016   Procedure: MAZE PROCEDURE AND APPLICATION OF  ATRICLIP LAA PROCLIP II 20 MM;  Surgeon: Rexene Alberts, MD;  Location: [place];  Service: Open Heart Surgery;  Laterality: N/A;  . MITRAL VALVE REPLACEMENT N/A 05/28/2016   Procedure: MITRAL VALVE (MV) REPLACEMENT USING 29 MM CARBOMEDICS OPTIFORM MECHANICAL BILEAFLET PROSTHETIC HEART VALVE;  Surgeon: Rexene Alberts, MD;  Location: [place];  Service: Open Heart Surgery;  Laterality: N/A;  . MULTIPLE EXTRACTIONS WITH ALVEOLOPLASTY N/A 03/30/2016   Procedure: EXTRACTION OF TOOTH #'S 2,5,6,11,15,16,20-30 AND 32 WITH ALVEOLOPLASTY AND BILATERAL MAXILLARY LATERAL EXOSTOSES REDUCTIONS;  Surgeon: Lenn Cal, DDS;  Location: [place];  Service: Oral Surgery;  Laterality: N/A;  . PATCH ANGIOPLASTY Right 03/10/2016   Procedure: PATCH ANGIOPLASTY CAROTID RIGHT USING Rueben Bash BIOLOGIC PATCH;  Surgeon: Serafina Mitchell, MD;  Location: [place];  Service: Vascular;  Laterality: Right;  . TEE WITHOUT CARDIOVERSION N/A 04/10/2013   Procedure: TRANSESOPHAGEAL ECHOCARDIOGRAM  (TEE);  Surgeon: [name], MD;  Location: [facility] ENDOSCOPY;  Service: Cardiovascular;  Laterality: N/A;  . TEE WITHOUT CARDIOVERSION N/A 03/24/2016   Procedure: TRANSESOPHAGEAL ECHOCARDIOGRAM (TEE);  Surgeon: Jerline Pain, MD;  Location: [place];  Service: Cardiovascular;  Laterality: N/A;  . TEE WITHOUT CARDIOVERSION N/A 05/28/2016   Procedure: TRANSESOPHAGEAL ECHOCARDIOGRAM (TEE);  Surgeon: Rexene Alberts, MD;  Location: [place];  Service: Open Heart Surgery;  Laterality: N/A;  . TUBAL LIGATION      Current Medications: Outpatient Medications Prior to Visit  Medication Sig Dispense Refill  . aspirin EC 81 MG EC tablet Take 1 tablet (81 mg total) by mouth daily.    [name] Kitchen atorvastatin (LIPITOR) 80 MG tablet TAKE ONE TABLET BY MOUTH ONCE DAILY AT 6 PM 30 tablet 5  . chlorhexidine (PERIDEX) 0.12 % solution Rinse with 15 mLs bid after breakfast and at bedtime.  Use in a swish and spit manner. 480 mL prn  . docusate sodium (COLACE) 100 MG capsule Take 1 capsule (100 mg total) by mouth 2 (two) times daily. 10 capsule 0  . feeding supplement (BOOST / RESOURCE BREEZE) LIQD Take 1 Container by mouth 2 (two) times daily between meals.    [name] Kitchen  furosemide (LASIX) 40 MG tablet Take 1 tablet (40 mg total) by mouth daily. 30 tablet 3  . metoprolol succinate (TOPROL-XL) 50 MG 24 hr tablet Take 1 tablet (50 mg total) by mouth daily. Take with or immediately following a meal. 30 tablet 2  . polyethylene glycol (MIRALAX / GLYCOLAX) packet Take 17 g by mouth daily. (Patient taking differently: Take 17 g by mouth daily as needed. ) 14 each 0  . warfarin (COUMADIN) 4 MG tablet Take 1 to 1.5 tablets by mouth daily as directed by coumadin clinic 40 tablet 1   No facility-administered medications prior to visit.      Allergies:   No known allergies   Social History   Social History  . Marital status: Married    Spouse name: N/A  . Number of children: 2  . Years of education: Associates   Social History  Main Topics  . Smoking status: Former Smoker    Packs/day: 1.00    Years: 5.00    Types: Cigarettes    Start date: 07/15/1970    Quit date: 05/11/1975  . Smokeless tobacco: Never Used     Comment: quit smoking 36 years ago  . Alcohol use No     Comment: drank years ago when young  . Drug use: No  . Sexual activity: Not Asked   Other Topics Concern  . None   Social History Narrative   Retired Archivist   Married   She has 2 grown children-   Daughter lives in [place]   Son lives in [place]   6 grandchildren         Family History:  The patient's family history includes Cancer in her father; Diabetes in her brother, father, mother, and sister; Heart disease in her father; Hypertension in her daughter; Stroke in her mother.   ROS:   Please see the history of present illness.    ROS All other systems reviewed and are negative.   PHYSICAL EXAM:   VS:  BP (!) 149/74   Pulse 75   Ht 5' 2.5" (1.588 m)   Wt 143 lb (64.9 kg)   SpO2 98%   BMI 25.74 kg/m    GEN: Well nourished, well developed, in no acute distress  HEENT: normal  Neck: no JVD, carotid bruits, or masses Cardiac: RRR; no murmurs, rubs, or gallops,no edema  Respiratory:  clear to auscultation bilaterally, normal work of breathing GI: soft, nontender, nondistended, + BS MS: no deformity or atrophy  Skin: warm and dry, no rash Neuro:  Alert and Oriented x 3, Strength and sensation are intact Psych: euthymic mood, full affect  Wt Readings from Last 3 Encounters:  11/12/16 143 lb (64.9 kg)  11/08/16 148 lb (67.1 kg)  11/04/16 147 lb (66.7 kg)      Studies/Labs Reviewed:   EKG:  EKG is ordered today.  The ekg ordered today demonstrates normal sinus rhythm with prolonged first degree AV block.  Recent Labs: 01/20/2016: Pro B Natriuretic peptide (BNP) 136.0 05/29/2016: Magnesium 2.3 07/07/2016: TSH 1.46 11/04/2016: ALT 41; Hemoglobin 10.6; Platelets 128.0 11/08/2016: BUN 19; Creatinine, Ser 0.86;  Potassium 3.7; Sodium 139   Lipid Panel    Component Value Date/Time   CHOL 107 07/07/2016 1231   TRIG 72.0 07/07/2016 1231   HDL 44.00 07/07/2016 1231   CHOLHDL 2 07/07/2016 1231   VLDL 14.4 07/07/2016 1231   LDLCALC 48 07/07/2016 1231    Additional studies/ records that were reviewed today include:  Myoview 01/27/2016 Study Highlights    The left ventricular ejection fraction is normal (55-65%).  Nuclear stress EF: 58%.  There was no ST segment deviation noted during stress.  No T wave inversion was noted during stress.  The study is normal.  This is a low risk study.    Cath 03/25/2016 Conclusion     The left ventricular systolic function is normal.  The left ventricular ejection fraction is 55-65% by visual estimate.  There is no aortic valve stenosis.  There is moderate mitral valve stenosis. Mean gradient 10 mm Hg. MV area 1.3 cm2  Mid Cx to Dist Cx lesion, 10 %stenosed.  Ost 1st Mrg to 1st Mrg lesion, 20 %stenosed.  Prox RCA lesion, 100 %stenosed. Left to right and right to right collaterals are present.  Hemodynamic findings consistent with moderate pulmonary hypertension and mitral valve stenosis.  LV end diastolic pressure is normal.   Further plans per inpatient team.  D/w Dr. Sallyanne Kuster.  Will obtain surgical evaluation for mitral valve repair.  Resume heparin 6 hours post sheath pull.     Echo 03/23/2016 LV EF: 55% -   60%  ------------------------------------------------------------------- Indications:      Rheumatic MS 394.0.  ------------------------------------------------------------------- History:   PMH:   Atrial fibrillation.  Risk factors: Hypertension. Dyslipidemia.  ------------------------------------------------------------------- Study Conclusions  - Left ventricle: The cavity size was normal. Wall thickness was   increased in a pattern of mild LVH. Systolic function was normal.   The estimated ejection fraction  was in the range of 55% to 60%.   Wall motion was normal; there were no regional wall motion   abnormalities. Doppler parameters are consistent with high   ventricular filling pressure. - Ventricular septum: The contour showed diastolic flattening and   systolic flattening. - Mitral valve: Calcified annulus. Severely thickened leaflets .   The findings are consistent with severe stenosis. There was mild   regurgitation. Valve area by pressure half-time: 1.48 cm^2. - Left atrium: The atrium was severely dilated. - Atrial septum: The septum bowed from left to right, consistent   with increased left atrial pressure. - Pericardium, extracardiac: A small pericardial effusion was   identified.  Impressions:  - Normal LV systolic function; mild LVH; elevated LV filling   pressure; calcified aortic valve with no significant AS;   thickened, calcified MV with probable severe MS (MVA by pressure   halftime 1.48 cm); severe LAE.    Echo 08/18/2016 LV EF: 45% -   50%  ------------------------------------------------------------------- Study Conclusions  - Left ventricle: The cavity size was normal. Systolic function was   mildly reduced. The estimated ejection fraction was in the range   of 45% to 50%. Diffuse hypokinesis. - Ventricular septum: The contour showed diastolic flattening. - Aortic valve: Moderately thickened, moderately calcified   leaflets. Cusp separation was reduced. - Mitral valve: A mechanical prosthesis was present. Well seated   and functioning normally. Valve area by continuity equation   (using LVOT flow): 0.6 cm^2. - Left atrium: The atrium was moderately dilated. - Right ventricle: The cavity size was mildly dilated. Wall   thickness was normal. - Right atrium: The atrium was severely dilated. - Pulmonary arteries: Systolic pressure was mildly increased. PA   peak pressure: 42 mm Hg (S).  Impressions:  - When compared to prior, mechanical MV is new,  EF is reduced.     ASSESSMENT:    1. Dyspnea, unspecified type   2. PAF (paroxysmal atrial fibrillation) ([place])   3.  H/O mitral valve replacement with mechanical valve   4. Coronary artery disease involving coronary bypass graft of native heart without angina pectoris   5. Essential hypertension   6. Hyperlipidemia, unspecified hyperlipidemia type   7. OSA (obstructive sleep apnea)      PLAN:  In order of problems listed above:  1. Dyspnea: Unclear cause, she does have a very mild murmur near the mitral valve area, however not very significant. Her shortness of breath occurs more so with exertion, we last cardiac catheterization report has been reviewed, other than the 100% RCA occlusion, she does not have any significant coronary artery disease. She did end up with a vein graft to PDA with mitral valve replacement surgery. She denies any recent chest pain, given lack of EKG change, I would favor observation for now, if symptom worsens, may consider ischemic evaluation. She just had echocardiogram in April, granted ejection fraction is slightly lower than last echocardiogram, there is no evidence this is caused by ischemia. On physical exam, she does not appear to be volume overloaded. She is currently on 40 mg daily of Lasix.  2. PAF s/p MAZE in 05/2016: Currently maintaining sinus rhythm, she has been taking Coumadin because of mechanical mitral valve, however recent PT/INR level has been subtherapeutic. I have discussed with her clinical pharmacist, they plan to increase her Coumadin dose. My concern with subtherapeutic Coumadin as development of thrombosis around the mechanical valve area which may impede blood flow and cause shortness of breath.   3. Mechanical mitral valve replacement: Recent echocardiogram April 2018 showed normal functioning mechanical valve.  4. Hypertension: Blood pressure has been elevated, adding 20 mg daily of losartan, she would need a basic metabolic panel on  Monday at primary care doctor's office.  5. Hyperlipidemia: On Lipitor 80 minutes one daily.    Medication Adjustments/Labs and Tests Ordered: Current medicines are reviewed at length with the patient today.  Concerns regarding medicines are outlined above.  Medication changes, Labs and Tests ordered today are listed in the Patient Instructions below. Patient Instructions  Medication Instructions:   CONTINUE current medications including lasix at same dosage.  START losartan 25mg  DAILY for blood pressure  Labwork:   Obtain BMET at primary care office on Monday  Testing/Procedures:  none  Follow-Up:  With [name] in 3 months  If you need a refill on your cardiac medications before your next appointment, please call your pharmacy.      Hilbert Corrigan, [place]  11/13/2016 1:43 PM    [facility] [address], [place], [place]  [address] Phone: [phone]; Fax: [phone]

## 2016-11-13 ENCOUNTER — Encounter: Payer: Self-pay | Admitting: Physician Assistant

## 2016-11-15 ENCOUNTER — Telehealth: Payer: Self-pay | Admitting: Family

## 2016-11-15 ENCOUNTER — Ambulatory Visit (INDEPENDENT_AMBULATORY_CARE_PROVIDER_SITE_OTHER): Payer: BC Managed Care – PPO | Admitting: Family

## 2016-11-15 ENCOUNTER — Encounter: Payer: Self-pay | Admitting: Family

## 2016-11-15 VITALS — BP 148/76 | HR 72 | Temp 98.4°F | Resp 20 | Ht 63.0 in | Wt 143.6 lb

## 2016-11-15 DIAGNOSIS — I5032 Chronic diastolic (congestive) heart failure: Secondary | ICD-10-CM

## 2016-11-15 DIAGNOSIS — K819 Cholecystitis, unspecified: Secondary | ICD-10-CM

## 2016-11-15 DIAGNOSIS — K810 Acute cholecystitis: Secondary | ICD-10-CM | POA: Diagnosis not present

## 2016-11-15 DIAGNOSIS — I10 Essential (primary) hypertension: Secondary | ICD-10-CM | POA: Diagnosis not present

## 2016-11-15 IMAGING — CT CT ANGIO NECK
1 of 12 series · 2 of 33 positions shown · IV contrast (Omni 300)
Comparison: CTA head and neck 01/29/2016

CLINICAL DATA: Acute onset mental status change.

EXAM:
CT ANGIOGRAPHY HEAD AND NECK
TECHNIQUE: Multidetector CT imaging of the head and neck was performed using
the standard protocol during bolus administration of intravenous
contrast. Multiplanar CT image reconstructions and MIPs were
obtained to evaluate the vascular anatomy. Carotid stenosis
measurements (when applicable) are obtained utilizing NASCET
criteria, using the distal internal carotid diameter as the
denominator.
CONTRAST:  50 mL Isovue 370 IV

[Series 5: cow 2.0 · axial · 0.45mm/px · z∈[-354,-48]mm · 2 of 154 slices shown]
[im 1/154  soft-tissue]
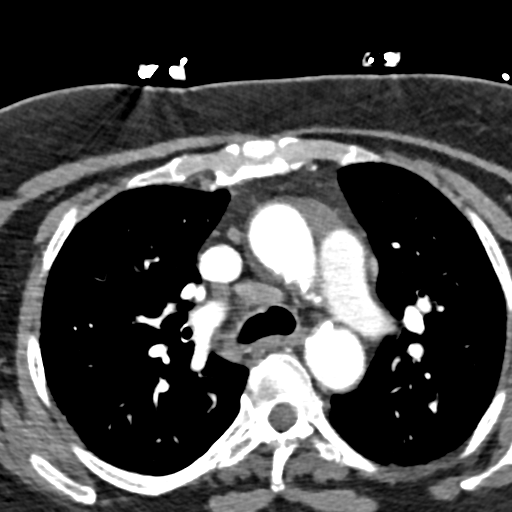
[im 154/154  bone]
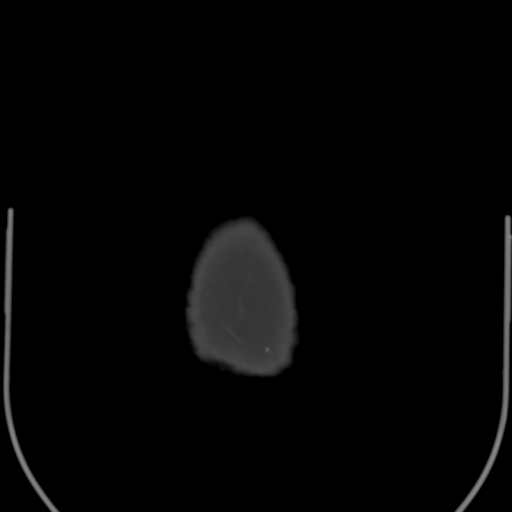

[2 of 33 positions shown; findings below may reference images not displayed]

FINDINGS: CT HEAD FINDINGS

Brain: There is an old left cerebellar infarct. There is left MCA
distribution encephalomalacia at the site of prior infarct. No CT
evidence of acute cortical infarct. No acute hemorrhage. No midline
shift or mass effect. No extra-axial collection.

Vascular: No hyperdense vessel or unexpected calcification.

Skull: Normal. Negative for fracture or focal lesion.

Sinuses: Imaged portions are clear.

Orbits: Normal.

Review of the MIP images confirms the above findings

CTA NECK FINDINGS

Aortic arch: There is atherosclerotic calcification within the
aortic arch. There is a normal branching pattern. The subclavian
arteries are normal proximally. There is atherosclerotic
calcification within the proximal arch vessels.

Right carotid system: There is mild narrowing of the right common
carotid artery at the level of the thyroid secondary to mass effect
from adjacent soft tissue. Otherwise, no hemodynamically significant
stenosis. The right internal carotid artery is normal.

Left carotid system: There is mild atherosclerotic calcification of
the left carotid bifurcation without hemodynamically significant
stenosis.

Vertebral arteries: There is narrowing of the right vertebral artery
origin secondary to atherosclerotic calcification. There is mild
calcification of the left vertebral artery origin without
significant stenosis. The vertebral system is right dominant. The
V2, V3 and V4 segments of the vertebral arteries are normal to the
confluence with the basilar artery.

Skeleton: There is no bony spinal canal stenosis. No lytic or
blastic lesions.

Other neck: There is marked soft tissue prominence within the right
carotid space, which is likely secondary to recent right carotid
endarterectomy performed March 10, 2016.

Upper chest: There is a ground-glass opacity measuring 4 mm in the
left upper lobe.

Review of the MIP images confirms the above findings

CTA HEAD FINDINGS

Anterior circulation:

--Intracranial internal carotid arteries: Mild calcification of the
internal carotid arteries at the skullbase.

--Anterior cerebral arteries: Normal.

--Middle cerebral arteries: Normal.

--Posterior communicating arteries: Absent bilaterally.

Posterior circulation:

--Posterior cerebral arteries: Normal.

--Superior cerebellar arteries: Normal.

--Basilar artery: Normal.

--Anterior inferior cerebellar arteries: Not visualized, which is
not uncommon.

--Posterior inferior cerebellar arteries: Normal.

Venous sinuses: As permitted by contrast timing, patent.

Anatomic variants: None.

Delayed phase: No abnormal parenchymal enhancement.

Review of the MIP images confirms the above findings
IMPRESSION: 1. No acute intracranial abnormality.
2. Findings of remote left MCA and left cerebellar infarcts.
3. Normal CTA of the circle of Willis and intracranial arteries.
4. Status post right carotid endarterectomy with greatly improved
patency.
5. No hemodynamically significant stenosis of the left carotid
system.

## 2016-11-15 NOTE — Telephone Encounter (Signed)
Labs will be done today.  I placed orders. Thanks.

## 2016-11-15 NOTE — Patient Instructions (Signed)
Please complete lab work prior to leaving. Please keep your upcoming appointment with the surgeon to evaluate her gallbladder. Continue losartan 25 mg once daily. Continue to monitor her weights daily. Call us if you gain more than 3 pounds overnight or 5 pounds in 1 week. Please call if you develop nausea or vomiting, go to the ER if you develop abdominal pain.

## 2016-11-15 NOTE — Telephone Encounter (Signed)
Marie Malone-- Can we add hepatic function panel to today's labs?

## 2016-11-15 NOTE — Telephone Encounter (Signed)
Per verbal from PCP, ok to cancel hepatic function panel. Test cancelled.

## 2016-11-15 NOTE — Assessment & Plan Note (Signed)
BP acceptable for her age. Continue losartan, check follow up bmet.

## 2016-11-15 NOTE — Telephone Encounter (Signed)
Spoke with pt, she has follow up with PCP this afternoon. Can she do labs today or does she need to come back later this week?

## 2016-11-15 NOTE — Assessment & Plan Note (Signed)
Appears euvolemic today. Her dyspnea symptoms have improved with the increased dose of Lasix. Will continue Lasix 40 mg once daily. Check follow-up electrolytes and renal function today.

## 2016-11-15 NOTE — Telephone Encounter (Signed)
I did review this case with Dr. Charlett Blake.   Please contact patient and let her know that since we aren't able to get her in sooner with the surgeon, I would like her to repeat the pended lab tests later this week to make sure everything looks stable.

## 2016-11-15 NOTE — Telephone Encounter (Signed)
-----   Message from Synthia Innocent sent at 11/15/2016  8:00 AM EDT ----- Patient is scheduled for 7/27, that's the soonest they had. ----- Message ----- From: Debbrah Alar, NP Sent: 11/14/2016  10:59 AM To: Synthia Innocent  Any update on this apt?  Thanks! ----- Message ----- From: Synthia Innocent Sent: 11/09/2016   2:31 PM To: Debbrah Alar, NP  [place] I faxed urgent referral over, awaiting appt ----- Message ----- From: Debbrah Alar, NP Sent: 11/09/2016   2:14 PM To: Synthia Innocent  Can we please try to get her in ASAP to surgery?  Thanks! Let me know if you have any problems.

## 2016-11-15 NOTE — Progress Notes (Signed)
Subjective:    Patient ID: Marie Malone, female    DOB: Oct 30, 1950, 66 y.o.   MRN: [id number]  HPI   Patient presents today for follow-up.  HTN- cardiology added losartan 25 mg by mouth once daily on 11/12/2016. BP Readings from Last 3 Encounters:  11/15/16 (!) 148/76  11/12/16 (!) 149/74  11/08/16 (!) 157/64   CHF- last visit she noted increased dyspnea on exertion. We consulted her cardiologist and it was decided that we would increase her Lasix from 20 mg to 40 mg once daily. Since her last visit at our office she is down 5 pounds. She continues her cardiac rehabilitation. She saw cardiology in follow-up on July 6. She reports improvement in her DOE since last visit.  Wt Readings from Last 3 Encounters:  11/15/16 143 lb 9.6 oz (65.1 kg)  11/12/16 143 lb (64.9 kg)  11/08/16 148 lb (67.1 kg)   Acute cholecystitis-this was incidentally noted on most recent abdominal ultrasound which was done due to abnormal LFTs. She has an upcoming appointment with general surgery at the end of the month. She reports very rare post prandial nausea Reports occasional RUQ pain.     Review of Systems See HPI    Past Medical History:  Diagnosis Date  . Atrial fibrillation ([place])   . Chronic diastolic CHF (congestive heart failure) ([place])   . Complication of anesthesia    difficult to wake up from anesthesia  . Coronary artery disease involving native coronary artery without angina pectoris 03/26/2016   100% chronic occlusion of RCA  . Dyspnea   . Heart murmur   . History of rheumatic fever    as a child  . Hyperlipidemia   . Hypertension   . Mitral stenosis   . OSA (obstructive sleep apnea)    mild per sleep study 2008  . Paroxysmal atrial fibrillation ([facility])   . Pneumonia    double pneumonia once  . Pulmonary hypertension ([place])   . Rheumatic mitral regurgitation   . S/P balloon mitral valvuloplasty 2008   . 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 S/P mitral  valve replacement with mechanical valve 05/28/2016   29 mm Sorin Carbomedics Optiform bileaflet mechanical prosthetic valve  . Stroke [facility])    x2     Social History   Social History  . Marital status: Married    Spouse name: N/A  . Number of children: 2  . Years of education: Associates   Occupational History  . Not on file.   Social History Main Topics  . Smoking status: Former Smoker    Packs/day: 1.00    Years: 5.00    Types: Cigarettes    Start date: 07/15/1970    Quit date: 05/11/1975  . Smokeless tobacco: Never Used     Comment: quit smoking 36 years ago  . Alcohol use No     Comment: drank years ago when young  . Drug use: No  . Sexual activity: Not on file   Other Topics Concern  . Not on file   Social History Narrative   Retired Archivist   Married   She has 2 grown children-   Daughter lives in [place]   Son lives in [place]   6 grandchildren        Past Surgical History:  Procedure Laterality Date  . APPENDECTOMY  1989   appendix ruptured,had peritonitis  . BALLOON VALVULOPLASTY  2008  [place] N/A 03/25/2016   Procedure: Right/Left Heart Cath and Coronary Angiography;  Surgeon: Jettie Booze, MD;  Location: [place] CV LAB;  Service: Cardiovascular;  Laterality: N/A;  . COLONOSCOPY W/ POLYPECTOMY    . CORONARY ARTERY BYPASS GRAFT N/A 05/28/2016   Procedure: CORONARY ARTERY BYPASS GRAFTING (CABG) x 1 WITH ENDOSCOPIC HARVESTING OF RIGHT SAPHENOUS VEIN, EVH- PDA;  Surgeon: Rexene Alberts, MD;  Location: [place];  Service: Open Heart Surgery;  Laterality: N/A;  . ENDARTERECTOMY Right 03/10/2016   Procedure: ENDARTERECTOMY CAROTID RIGHT;  Surgeon: Serafina Mitchell, MD;  Location: [place];  Service: Vascular;  Laterality: Right;  . EP IMPLANTABLE DEVICE N/A 03/11/2016   Procedure: Loop Recorder Removal;  Surgeon: Deboraha Sprang, MD;  Location: [place] CV LAB;  Service: Cardiovascular;  Laterality: N/A;  . LOOP  RECORDER IMPLANT  04-10-2013   MDT LinQ implanted by Dr Rayann Heman for cryptogenic stroke  . LOOP RECORDER IMPLANT N/A 04/10/2013   Procedure: LOOP RECORDER IMPLANT;  Surgeon: Coralyn Mark, MD;  Location: [place] CATH LAB;  Service: Cardiovascular;  Laterality: N/A;  . MAZE N/A 05/28/2016   Procedure: MAZE PROCEDURE AND APPLICATION OF  ATRICLIP LAA PROCLIP II 58 MM;  Surgeon: Rexene Alberts, MD;  Location: [place];  Service: Open Heart Surgery;  Laterality: N/A;  . MITRAL VALVE REPLACEMENT N/A 05/28/2016   Procedure: MITRAL VALVE (MV) REPLACEMENT USING 29 MM CARBOMEDICS OPTIFORM MECHANICAL BILEAFLET PROSTHETIC HEART VALVE;  Surgeon: Rexene Alberts, MD;  Location: [place];  Service: Open Heart Surgery;  Laterality: N/A;  . MULTIPLE EXTRACTIONS WITH ALVEOLOPLASTY N/A 03/30/2016   Procedure: EXTRACTION OF TOOTH #'S 2,5,6,11,15,16,20-30 AND 32 WITH ALVEOLOPLASTY AND BILATERAL MAXILLARY LATERAL EXOSTOSES REDUCTIONS;  Surgeon: Lenn Cal, DDS;  Location: [place];  Service: Oral Surgery;  Laterality: N/A;  . PATCH ANGIOPLASTY Right 03/10/2016   Procedure: PATCH ANGIOPLASTY CAROTID RIGHT USING Rueben Bash BIOLOGIC PATCH;  Surgeon: Serafina Mitchell, MD;  Location: [place];  Service: Vascular;  Laterality: Right;  . TEE WITHOUT CARDIOVERSION N/A 04/10/2013   Procedure: TRANSESOPHAGEAL ECHOCARDIOGRAM (TEE);  Surgeon: [name], MD;  Location: [facility] ENDOSCOPY;  Service: Cardiovascular;  Laterality: N/A;  . TEE WITHOUT CARDIOVERSION N/A 03/24/2016   Procedure: TRANSESOPHAGEAL ECHOCARDIOGRAM (TEE);  Surgeon: Jerline Pain, MD;  Location: [place];  Service: Cardiovascular;  Laterality: N/A;  . TEE WITHOUT CARDIOVERSION N/A 05/28/2016   Procedure: TRANSESOPHAGEAL ECHOCARDIOGRAM (TEE);  Surgeon: Rexene Alberts, MD;  Location: [place];  Service: Open Heart Surgery;  Laterality: N/A;  . TUBAL LIGATION      Family History  Problem Relation Age of Onset  . Diabetes Mother   . Stroke Mother   . Diabetes Father   . Heart  disease Father        CHF  . Cancer Father        kidney  . Diabetes Sister   . Diabetes Brother   . Hypertension Daughter     Allergies  Allergen Reactions  . No Known Allergies     Current Outpatient Prescriptions on File Prior to Visit  Medication Sig Dispense Refill  . aspirin EC 81 MG EC tablet Take 1 tablet (81 mg total) by mouth daily.    [name] Kitchen atorvastatin (LIPITOR) 80 MG tablet TAKE ONE TABLET BY MOUTH ONCE DAILY AT 6 PM 30 tablet 5  . chlorhexidine (PERIDEX) 0.12 % solution Rinse with 15 mLs bid after breakfast and at bedtime.  Use in a  swish and spit manner. 480 mL prn  . docusate sodium (COLACE) 100 MG capsule Take 1 capsule (100 mg total) by mouth 2 (two) times daily. 10 capsule 0  . feeding supplement (BOOST / RESOURCE BREEZE) LIQD Take 1 Container by mouth 2 (two) times daily between meals.    . furosemide (LASIX) 40 MG tablet Take 1 tablet (40 mg total) by mouth daily. 30 tablet 3  . losartan (COZAAR) 25 MG tablet Take 1 tablet (25 mg total) by mouth daily. 90 tablet 1  . metoprolol succinate (TOPROL-XL) 50 MG 24 hr tablet Take 1 tablet (50 mg total) by mouth daily. Take with or immediately following a meal. 30 tablet 2  . polyethylene glycol (MIRALAX / GLYCOLAX) packet Take 17 g by mouth daily. (Patient taking differently: Take 17 g by mouth daily as needed. ) 14 each 0  . warfarin (COUMADIN) 4 MG tablet Take 1 to 1.5 tablets by mouth daily as directed by coumadin clinic 40 tablet 1   No current facility-administered medications on file prior to visit.     BP (!) 148/76 (BP Location: Left Arm, Cuff Size: Normal)   Pulse 72   Temp 98.4 F (36.9 C) (Oral)   Resp 20   Ht 5\' 3"  (1.6 m)   Wt 143 lb 9.6 oz (65.1 kg)   SpO2 100%   BMI 25.44 kg/m    Objective:   Physical Exam  Constitutional: She is oriented to person, place, and time. She appears well-developed and well-nourished.  HENT:  Head: Normocephalic and atraumatic.  Cardiovascular: Normal rate and regular  rhythm.   Murmur heard. Pulmonary/Chest: Effort normal and breath sounds normal. No respiratory distress. She has no wheezes.  Musculoskeletal: She exhibits no edema.  Neurological: She is alert and oriented to person, place, and time.  Skin: Skin is warm and dry.  Psychiatric: She has a normal mood and affect. Her behavior is normal. Judgment and thought content normal.          Assessment & Plan:  Acute cholecystitis- advised pt To complete follow-up lab work is ordered today. We discussed signs and symptoms of worsening cholecystitis and she understands to go to the emergency department if this occurs.  She is advised to keep her upcoming surgical consult on July 27.

## 2016-11-15 NOTE — Telephone Encounter (Signed)
Might be duplicate test, CMP was ordered.

## 2016-11-16 LAB — COMPREHENSIVE METABOLIC PANEL
ALBUMIN: 3.7 g/dL (ref 3.5–5.2)
ALT: 33 U/L (ref 0–35)
AST: 50 U/L — AB (ref 0–37)
Alkaline Phosphatase: 140 U/L — ABNORMAL HIGH (ref 39–117)
BILIRUBIN TOTAL: 1 mg/dL (ref 0.2–1.2)
BUN: 17 mg/dL (ref 6–23)
CALCIUM: 9.2 mg/dL (ref 8.4–10.5)
CHLORIDE: 102 meq/L (ref 96–112)
CO2: 31 meq/L (ref 19–32)
CREATININE: 0.91 mg/dL (ref 0.40–1.20)
GFR: 79.82 mL/min (ref >60.00)
Glucose, Bld: 77 mg/dL (ref 70–99)
Potassium: 3.5 mEq/L (ref 3.5–5.1)
Sodium: 140 mEq/L (ref 135–145)
Total Protein: 7.7 g/dL (ref 6.0–8.3)

## 2016-11-16 LAB — CBC WITH DIFFERENTIAL/PLATELET
BASOS ABS: 0.1 10*3/uL (ref 0.0–0.1)
Basophils Relative: 1.9 % (ref 0.0–3.0)
EOS ABS: 0.1 10*3/uL (ref 0.0–0.7)
Eosinophils Relative: 2.3 % (ref 0.0–5.0)
HEMATOCRIT: 32.9 % — AB (ref 36.0–46.0)
HEMOGLOBIN: 10.5 g/dL — AB (ref 12.0–15.0)
LYMPHS PCT: 27 % (ref 12.0–46.0)
Lymphs Abs: 1.1 10*3/uL (ref 0.7–4.0)
MCHC: 31.9 g/dL (ref 30.0–36.0)
MCV: 84.7 fl (ref 78.0–100.0)
MONOS PCT: 15.4 % — AB (ref 3.0–12.0)
Monocytes Absolute: 0.6 10*3/uL (ref 0.1–1.0)
Neutro Abs: 2.1 10*3/uL (ref 1.4–7.7)
Neutrophils Relative %: 53.4 % (ref 43.0–77.0)
Platelets: 104 10*3/uL — ABNORMAL LOW (ref 150.0–400.0)
RBC: 3.88 Mil/uL (ref 3.87–5.11)
RDW: 17.2 % — ABNORMAL HIGH (ref 11.5–15.5)
WBC: 3.9 10*3/uL — AB (ref 4.0–10.5)

## 2016-11-26 ENCOUNTER — Ambulatory Visit (INDEPENDENT_AMBULATORY_CARE_PROVIDER_SITE_OTHER): Payer: BC Managed Care – PPO | Admitting: Pharmacist

## 2016-11-26 DIAGNOSIS — I480 Paroxysmal atrial fibrillation: Secondary | ICD-10-CM

## 2016-11-26 DIAGNOSIS — I4891 Unspecified atrial fibrillation: Secondary | ICD-10-CM

## 2016-11-26 LAB — POCT INR: INR: 2.1

## 2016-11-30 ENCOUNTER — Encounter (HOSPITAL_COMMUNITY): Payer: Self-pay | Admitting: Nurse Practitioner

## 2016-11-30 ENCOUNTER — Ambulatory Visit (HOSPITAL_COMMUNITY)
Admit: 2016-11-30 | Discharge: 2016-11-30 | Disposition: A | Discharge status: Home / Self Care | Payer: BC Managed Care – PPO | Attending: Nurse Practitioner | Admitting: Nurse Practitioner

## 2016-11-30 VITALS — BP 124/72 | HR 76 | Ht 63.0 in | Wt 142.8 lb

## 2016-11-30 DIAGNOSIS — Z8051 Family history of malignant neoplasm of kidney: Secondary | ICD-10-CM | POA: Diagnosis not present

## 2016-11-30 DIAGNOSIS — Z79899 Other long term (current) drug therapy: Secondary | ICD-10-CM | POA: Insufficient documentation

## 2016-11-30 DIAGNOSIS — Z951 Presence of aortocoronary bypass graft: Secondary | ICD-10-CM | POA: Diagnosis not present

## 2016-11-30 DIAGNOSIS — Z9889 Other specified postprocedural states: Secondary | ICD-10-CM | POA: Diagnosis not present

## 2016-11-30 DIAGNOSIS — I272 Pulmonary hypertension, unspecified: Secondary | ICD-10-CM | POA: Insufficient documentation

## 2016-11-30 DIAGNOSIS — I480 Paroxysmal atrial fibrillation: Secondary | ICD-10-CM | POA: Insufficient documentation

## 2016-11-30 DIAGNOSIS — I5032 Chronic diastolic (congestive) heart failure: Secondary | ICD-10-CM | POA: Insufficient documentation

## 2016-11-30 DIAGNOSIS — Z823 Family history of stroke: Secondary | ICD-10-CM | POA: Insufficient documentation

## 2016-11-30 DIAGNOSIS — Z7901 Long term (current) use of anticoagulants: Secondary | ICD-10-CM | POA: Insufficient documentation

## 2016-11-30 DIAGNOSIS — I110 Hypertensive heart disease with heart failure: Secondary | ICD-10-CM | POA: Diagnosis not present

## 2016-11-30 DIAGNOSIS — Z87891 Personal history of nicotine dependence: Secondary | ICD-10-CM | POA: Insufficient documentation

## 2016-11-30 DIAGNOSIS — Z7982 Long term (current) use of aspirin: Secondary | ICD-10-CM | POA: Diagnosis not present

## 2016-11-30 DIAGNOSIS — Z8673 Personal history of transient ischemic attack (TIA), and cerebral infarction without residual deficits: Secondary | ICD-10-CM | POA: Insufficient documentation

## 2016-11-30 DIAGNOSIS — E785 Hyperlipidemia, unspecified: Secondary | ICD-10-CM | POA: Diagnosis not present

## 2016-11-30 DIAGNOSIS — Z8249 Family history of ischemic heart disease and other diseases of the circulatory system: Secondary | ICD-10-CM | POA: Diagnosis not present

## 2016-11-30 DIAGNOSIS — Z833 Family history of diabetes mellitus: Secondary | ICD-10-CM | POA: Insufficient documentation

## 2016-11-30 DIAGNOSIS — I2510 Atherosclerotic heart disease of native coronary artery without angina pectoris: Secondary | ICD-10-CM | POA: Diagnosis not present

## 2016-11-30 DIAGNOSIS — Z952 Presence of prosthetic heart valve: Secondary | ICD-10-CM | POA: Insufficient documentation

## 2016-11-30 DIAGNOSIS — I4891 Unspecified atrial fibrillation: Secondary | ICD-10-CM | POA: Diagnosis present

## 2016-11-30 DIAGNOSIS — G4733 Obstructive sleep apnea (adult) (pediatric): Secondary | ICD-10-CM | POA: Insufficient documentation

## 2016-11-30 DIAGNOSIS — I2582 Chronic total occlusion of coronary artery: Secondary | ICD-10-CM | POA: Insufficient documentation

## 2016-11-30 NOTE — Progress Notes (Signed)
Primary Care Physician: Debbrah Alar, NP Referring Physician: Dr. Burna Sis is a 66 y.o. female with a h/o long-standing rheumatic heart disease, chronic diastolic congestive heart failure, recurrent paroxysmal atrial fibrillation, 2 previous embolic strokes, and cerebrovascular disease who returns to the office today for routine follow-up status post mitral valve replacement using a bileaflet mechanical prosthetic valve, coronary artery bypass grafting 1, and Maze procedure on 05/28/2016.   She is being seen in the afib clinic for long term surveillance of Maze procedure in ability to maintain sinus rhythm. Pt has been seen several times in f/u of surgery and has been in SR. She has not noted any irregular heart beat. Feels overall she is much better since surgery and with much shortness of breath. Continues on warfarin with last INR 7/20. 2.1  Today, she denies symptoms of palpitations, chest pain, shortness of breath, orthopnea, PND, lower extremity edema, dizziness, presyncope, syncope, or neurologic sequela. The patient is tolerating medications without difficulties and is otherwise without complaint today.   Past Medical History:  Diagnosis Date  . Atrial fibrillation ([place])   . Chronic diastolic CHF (congestive heart failure) ([place])   . Complication of anesthesia    difficult to wake up from anesthesia  . Coronary artery disease involving native coronary artery without angina pectoris 03/26/2016   100% chronic occlusion of RCA  . Dyspnea   . Heart murmur   . History of rheumatic fever    as a child  . Hyperlipidemia   . Hypertension   . Mitral stenosis   . OSA (obstructive sleep apnea)    mild per sleep study 2008  . Paroxysmal atrial fibrillation ([facility])   . Pneumonia    double pneumonia once  . Pulmonary hypertension ([place])   . Rheumatic mitral regurgitation   . S/P balloon mitral valvuloplasty 2008   . S/P CABG x 1 05/28/2016   SVG to PDA with EVH via  right thigh  . S/P mitral valve replacement with mechanical valve 05/28/2016   29 mm Sorin Carbomedics Optiform bileaflet mechanical prosthetic valve  . Stroke [facility])    x2   Past Surgical History:  Procedure Laterality Date  . APPENDECTOMY  1989   appendix ruptured,had peritonitis  . BALLOON VALVULOPLASTY  2008   [facility]  . CARDIAC CATHETERIZATION N/A 03/25/2016   Procedure: Right/Left Heart Cath and Coronary Angiography;  Surgeon: Jettie Booze, MD;  Location: [place] CV LAB;  Service: Cardiovascular;  Laterality: N/A;  . COLONOSCOPY W/ POLYPECTOMY    . CORONARY ARTERY BYPASS GRAFT N/A 05/28/2016   Procedure: CORONARY ARTERY BYPASS GRAFTING (CABG) x 1 WITH ENDOSCOPIC HARVESTING OF RIGHT SAPHENOUS VEIN, EVH- PDA;  Surgeon: Rexene Alberts, MD;  Location: [place];  Service: Open Heart Surgery;  Laterality: N/A;  . ENDARTERECTOMY Right 03/10/2016   Procedure: ENDARTERECTOMY CAROTID RIGHT;  Surgeon: Serafina Mitchell, MD;  Location: [place];  Service: Vascular;  Laterality: Right;  . EP IMPLANTABLE DEVICE N/A 03/11/2016   Procedure: Loop Recorder Removal;  Surgeon: Deboraha Sprang, MD;  Location: [place] CV LAB;  Service: Cardiovascular;  Laterality: N/A;  . 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 LOOP RECORDER IMPLANT N/A 04/10/2013   Procedure: LOOP RECORDER IMPLANT;  Surgeon: Coralyn Mark, MD;  Location: [place] CATH LAB;  Service: Cardiovascular;  Laterality: N/A;  . MAZE N/A 05/28/2016   Procedure: MAZE PROCEDURE AND APPLICATION OF  ATRICLIP LAA PROCLIP II 16 MM;  Surgeon: Rexene Alberts, MD;  Location: [place];  Service: Open Heart Surgery;  Laterality: N/A;  . MITRAL VALVE REPLACEMENT N/A 05/28/2016   Procedure: MITRAL VALVE (MV) REPLACEMENT USING 29 MM CARBOMEDICS OPTIFORM MECHANICAL BILEAFLET PROSTHETIC HEART VALVE;  Surgeon: Rexene Alberts, MD;  Location: [place];  Service: Open Heart Surgery;  Laterality: N/A;  . MULTIPLE EXTRACTIONS  WITH ALVEOLOPLASTY N/A 03/30/2016   Procedure: EXTRACTION OF TOOTH #'S 2,5,6,11,15,16,20-30 AND 32 WITH ALVEOLOPLASTY AND BILATERAL MAXILLARY LATERAL EXOSTOSES REDUCTIONS;  Surgeon: Lenn Cal, DDS;  Location: [place];  Service: Oral Surgery;  Laterality: N/A;  . PATCH ANGIOPLASTY Right 03/10/2016   Procedure: PATCH ANGIOPLASTY CAROTID RIGHT USING Rueben Bash BIOLOGIC PATCH;  Surgeon: Serafina Mitchell, MD;  Location: [place];  Service: Vascular;  Laterality: Right;  . TEE WITHOUT CARDIOVERSION N/A 04/10/2013   Procedure: TRANSESOPHAGEAL ECHOCARDIOGRAM (TEE);  Surgeon: [name], MD;  Location: [facility] ENDOSCOPY;  Service: Cardiovascular;  Laterality: N/A;  . TEE WITHOUT CARDIOVERSION N/A 03/24/2016   Procedure: TRANSESOPHAGEAL ECHOCARDIOGRAM (TEE);  Surgeon: Jerline Pain, MD;  Location: [place];  Service: Cardiovascular;  Laterality: N/A;  . TEE WITHOUT CARDIOVERSION N/A 05/28/2016   Procedure: TRANSESOPHAGEAL ECHOCARDIOGRAM (TEE);  Surgeon: Rexene Alberts, MD;  Location: [place];  Service: Open Heart Surgery;  Laterality: N/A;  . TUBAL LIGATION      Current Outpatient Prescriptions  Medication Sig Dispense Refill  . aspirin EC 81 MG EC tablet Take 1 tablet (81 mg total) by mouth daily.    [name] Kitchen atorvastatin (LIPITOR) 80 MG tablet TAKE ONE TABLET BY MOUTH ONCE DAILY AT 6 PM 30 tablet 5  . chlorhexidine (PERIDEX) 0.12 % solution Rinse with 15 mLs bid after breakfast and at bedtime.  Use in a swish and spit manner. 480 mL prn  . docusate sodium (COLACE) 100 MG capsule Take 1 capsule (100 mg total) by mouth 2 (two) times daily. 10 capsule 0  . feeding supplement (BOOST / RESOURCE BREEZE) LIQD Take 1 Container by mouth 2 (two) times daily between meals.    . furosemide (LASIX) 40 MG tablet Take 1 tablet (40 mg total) by mouth daily. 30 tablet 3  . losartan (COZAAR) 25 MG tablet Take 1 tablet (25 mg total) by mouth daily. 90 tablet 1  . metoprolol succinate (TOPROL-XL) 50 MG 24 hr tablet Take 1  tablet (50 mg total) by mouth daily. Take with or immediately following a meal. 30 tablet 2  . polyethylene glycol (MIRALAX / GLYCOLAX) packet Take 17 g by mouth daily. (Patient taking differently: Take 17 g by mouth daily as needed. ) 14 each 0  . warfarin (COUMADIN) 4 MG tablet Take 1 to 1.5 tablets by mouth daily as directed by coumadin clinic 40 tablet 1   No current facility-administered medications for this encounter.     Allergies  Allergen Reactions  . No Known Allergies     Social History   Social History  . Marital status: Married    Spouse name: N/A  . Number of children: 2  . Years of education: Associates   Occupational History  . Not on file.   Social History Main Topics  . Smoking status: Former Smoker    Packs/day: 1.00    Years: 5.00    Types: Cigarettes    Start date: 07/15/1970    Quit date: 05/11/1975  . Smokeless tobacco: Never Used     Comment: quit smoking 36 years ago  .  Alcohol use No     Comment: drank years ago when young  . Drug use: No  . Sexual activity: Not on file   Other Topics Concern  . Not on file   Social History Narrative   Retired Archivist   Married   She has 2 grown children-   Daughter lives in [place]   Son lives in [place]   6 grandchildren        Family History  Problem Relation Age of Onset  . Diabetes Mother   . Stroke Mother   . Diabetes Father   . Heart disease Father        CHF  . Cancer Father        kidney  . Diabetes Sister   . Diabetes Brother   . Hypertension Daughter     ROS- All systems are reviewed and negative except as per the HPI above  Physical Exam: Vitals:   11/30/16 1134  BP: 124/72  Pulse: 76  Weight: 142 lb 12.8 oz (64.8 kg)  Height: 5\' 3"  (1.6 m)   Wt Readings from Last 3 Encounters:  11/30/16 142 lb 12.8 oz (64.8 kg)  11/15/16 143 lb 9.6 oz (65.1 kg)  11/12/16 143 lb (64.9 kg)    Labs: Lab Results  Component Value Date   NA 140 11/15/2016   K 3.5  11/15/2016   CL 102 11/15/2016   CO2 31 11/15/2016   GLUCOSE 77 11/15/2016   BUN 17 11/15/2016   CREATININE 0.91 11/15/2016   CALCIUM 9.2 11/15/2016   PHOS 3.7 03/19/2016   MG 2.3 05/29/2016   Lab Results  Component Value Date   INR 2.1 11/26/2016   Lab Results  Component Value Date   CHOL 107 07/07/2016   HDL 44.00 07/07/2016   LDLCALC 48 07/07/2016   TRIG 72.0 07/07/2016     GEN- The patient is well appearing, alert and oriented x 3 today.   Head- normocephalic, atraumatic Eyes-  Sclera clear, conjunctiva pink Ears- hearing intact Oropharynx- clear Neck- supple, no JVP Lymph- no cervical lymphadenopathy Lungs- Clear to ausculation bilaterally, normal work of breathing Heart- Regular rate and rhythm, no murmurs, rubs or gallops, PMI not laterally displaced GI- soft, NT, ND, + BS Extremities- no clubbing, cyanosis, or edema MS- no significant deformity or atrophy Skin- no rash or lesion Psych- euthymic mood, full affect Neuro- strength and sensation are intact  EKG-SR with first degree AV block, 70 bpm. LAD Epic records reviewed    Assessment and Plan: 1. H/o afib with MV repair with a bileaflet mechanical prosthetic valve, Maze procedure Appears to be maintaining SR Continue warfarin for a chadsvasc score of at least 6 Continue metoprolol  2. CAD S/p 1 vessel bypass No changes Per [name]  3. HTN Stable  F/u in one year afib clinic for long term surveillance of Maze procedure  Butch Penny C. Carroll, [facility] [address] [place], [place] [address] [phone]

## 2016-12-03 ENCOUNTER — Ambulatory Visit: Payer: Self-pay | Admitting: Surgery

## 2016-12-07 ENCOUNTER — Other Ambulatory Visit: Payer: Self-pay | Admitting: Family

## 2016-12-07 DIAGNOSIS — Z0000 Encounter for general adult medical examination without abnormal findings: Secondary | ICD-10-CM

## 2016-12-28 ENCOUNTER — Ambulatory Visit (INDEPENDENT_AMBULATORY_CARE_PROVIDER_SITE_OTHER): Payer: Medicare Other | Admitting: Pharmacist

## 2016-12-28 DIAGNOSIS — I4891 Unspecified atrial fibrillation: Secondary | ICD-10-CM | POA: Diagnosis not present

## 2016-12-28 DIAGNOSIS — I480 Paroxysmal atrial fibrillation: Secondary | ICD-10-CM | POA: Diagnosis not present

## 2016-12-28 LAB — POCT INR: INR: 2.6

## 2017-01-14 ENCOUNTER — Other Ambulatory Visit: Payer: Self-pay | Admitting: Cardiology

## 2017-01-14 DIAGNOSIS — I4891 Unspecified atrial fibrillation: Secondary | ICD-10-CM

## 2017-01-14 DIAGNOSIS — I480 Paroxysmal atrial fibrillation: Secondary | ICD-10-CM

## 2017-02-01 ENCOUNTER — Ambulatory Visit (INDEPENDENT_AMBULATORY_CARE_PROVIDER_SITE_OTHER): Payer: Medicare Other | Admitting: Pharmacist Clinician (PhC)/ Clinical Pharmacy Specialist

## 2017-02-01 DIAGNOSIS — I4891 Unspecified atrial fibrillation: Secondary | ICD-10-CM | POA: Diagnosis not present

## 2017-02-01 DIAGNOSIS — Z5181 Encounter for therapeutic drug level monitoring: Secondary | ICD-10-CM

## 2017-02-01 DIAGNOSIS — Z7901 Long term (current) use of anticoagulants: Secondary | ICD-10-CM

## 2017-02-01 DIAGNOSIS — Z8673 Personal history of transient ischemic attack (TIA), and cerebral infarction without residual deficits: Secondary | ICD-10-CM

## 2017-02-01 DIAGNOSIS — I480 Paroxysmal atrial fibrillation: Secondary | ICD-10-CM

## 2017-02-01 LAB — POCT INR: INR: 2.2

## 2017-02-04 ENCOUNTER — Telehealth: Payer: Self-pay | Admitting: Family

## 2017-02-07 NOTE — Telephone Encounter (Signed)
Refill sent on metoprolol. Pt last seen 11/15/16 and has no future appts scheduled. Please advise when pt should follow up in the office?

## 2017-02-07 NOTE — Telephone Encounter (Signed)
I would like to see her back in the next one month please.

## 2017-02-10 NOTE — Telephone Encounter (Signed)
Called pt. She said that she will call back to schedule apt once she check her schedule.

## 2017-02-14 ENCOUNTER — Telehealth: Payer: Self-pay | Admitting: *Deleted

## 2017-02-14 ENCOUNTER — Encounter: Payer: Self-pay | Admitting: Cardiology

## 2017-02-14 NOTE — Progress Notes (Signed)
HPI: FU MVR and atrial fibrillation. Patient has a history of rheumatic fever/MS. Patient had valvuloplasty at [facility] in 2008. Abdominal ultrasound in April of 2014 normal. Prior ILRrevealed paroxysmal atrial fibrillation and anticoagulation was initiated. Patient had carotid endarterectomy November 2017. Admitted with syncope that same month. Had Mobitz type I second-degree AV block when in sinus rhythm but rate controlled when in atrial fibrillation. CTA November 2017 following CEA showed prior right carotid endarterectomy and no hemodynamically significant stenosis of the left carotid. TEE November 2017 showed normal LV function, moderate to severe mitral stenosis, mild left atrial enlargement. Cardiac catheterization November 2017 showed normal LV systolic function, moderate mitral stenosis with mean gradient 10 mmHg, 100% right coronary artery with left-to-right collaterals and moderate pulmonary hypertension. Pt had mechanical MVR, CABG (SVG to PDA) and Maze 1/18. Echocardiogram April 2018 showed ejection fraction 45-50%, mechanical mitral valve, moderate left atrial enlargement, severe right atrial enlargement, mild right ventricular enlargement. Since last seen,  She denies dyspnea, chest pain, palpitations or bleeding.  Current Outpatient Prescriptions  Medication Sig Dispense Refill  . aspirin EC 81 MG EC tablet Take 1 tablet (81 mg total) by mouth daily.    [name] Kitchen atorvastatin (LIPITOR) 80 MG tablet TAKE ONE TABLET BY MOUTH ONCE DAILY AT  6  PM 30 tablet 5  . chlorhexidine (PERIDEX) 0.12 % solution Rinse with 15 mLs bid after breakfast and at bedtime.  Use in a swish and spit manner. 480 mL prn  . docusate sodium (COLACE) 100 MG capsule Take 1 capsule (100 mg total) by mouth 2 (two) times daily. 10 capsule 0  . feeding supplement (BOOST / RESOURCE BREEZE) LIQD Take 1 Container by mouth 2 (two) times daily between meals.    . furosemide (LASIX) 40 MG tablet Take 1 tablet (40 mg total) by mouth  daily. 30 tablet 3  . metoprolol succinate (TOPROL-XL) 50 MG 24 hr tablet TAKE 1 TABLET BY MOUTH ONCE DAILY WITH OR IMMEDIATELY FOLLOWING A MEAL 90 tablet 0  . polyethylene glycol (MIRALAX / GLYCOLAX) packet Take 17 g by mouth daily. (Patient taking differently: Take 17 g by mouth daily as needed. ) 14 each 0  . warfarin (COUMADIN) 4 MG tablet TAKE 1 TO 1 & 1/2 (ONE & ONE-HALF) TABLETS BY MOUTH ONCE DAILY AS DIRECTED BY THE COUMADIN CLINIC 40 tablet 2  . losartan (COZAAR) 25 MG tablet Take 1 tablet (25 mg total) by mouth daily. 90 tablet 1   No current facility-administered medications for this visit.      Past Medical History:  Diagnosis Date  . Atrial fibrillation ([place])   . Chronic diastolic CHF (congestive heart failure) ([place])   . Complication of anesthesia    difficult to wake up from anesthesia  . Coronary artery disease involving native coronary artery without angina pectoris 03/26/2016   100% chronic occlusion of RCA  . Dyspnea   . Heart murmur   . History of rheumatic fever    as a child  . Hyperlipidemia   . Hypertension   . Mitral stenosis   . OSA (obstructive sleep apnea)    mild per sleep study 2008  . Paroxysmal atrial fibrillation ([facility])   . Pneumonia    double pneumonia once  . Pulmonary hypertension ([place])   . Rheumatic mitral regurgitation   . S/P balloon mitral valvuloplasty 2008   . 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 S/P mitral  valve replacement with mechanical valve 05/28/2016   29 mm Sorin [employer]omedics Optiform bileaflet mechanical prosthetic valve  . Stroke [facility])    x2    Past Surgical History:  Procedure Laterality Date  . APPENDECTOMY  1989   appendix ruptured,had peritonitis  . BALLOON VALVULOPLASTY  2008   [facility]  . CARDIAC CATHETERIZATION N/A 03/25/2016   Procedure: Right/Left Heart Cath and Coronary Angiography;  Surgeon: Jettie Booze, MD;  Location: [place] CV LAB;  Service: Cardiovascular;  Laterality:  N/A;  . COLONOSCOPY W/ POLYPECTOMY    . CORONARY ARTERY BYPASS GRAFT N/A 05/28/2016   Procedure: CORONARY ARTERY BYPASS GRAFTING (CABG) x 1 WITH ENDOSCOPIC HARVESTING OF RIGHT SAPHENOUS VEIN, EVH- PDA;  Surgeon: Rexene Alberts, MD;  Location: [place];  Service: Open Heart Surgery;  Laterality: N/A;  . ENDARTERECTOMY Right 03/10/2016   Procedure: ENDARTERECTOMY CAROTID RIGHT;  Surgeon: Serafina Mitchell, MD;  Location: [place];  Service: Vascular;  Laterality: Right;  . EP IMPLANTABLE DEVICE N/A 03/11/2016   Procedure: Loop Recorder Removal;  Surgeon: Deboraha Sprang, MD;  Location: [place] CV [facility];  Service: Cardiovascular;  Laterality: N/A;  . 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 LOOP RECORDER IMPLANT N/A 04/10/2013   Procedure: LOOP RECORDER IMPLANT;  Surgeon: Coralyn Mark, MD;  Location: [place] CATH LAB;  Service: Cardiovascular;  Laterality: N/A;  . MAZE N/A 05/28/2016   Procedure: MAZE PROCEDURE AND APPLICATION OF  ATRICLIP LAA PROCLIP II 60 MM;  Surgeon: Rexene Alberts, MD;  Location: [place];  Service: Open Heart Surgery;  Laterality: N/A;  . MITRAL VALVE REPLACEMENT N/A 05/28/2016   Procedure: MITRAL VALVE (MV) REPLACEMENT USING 29 MM CARBOMEDICS OPTIFORM MECHANICAL BILEAFLET PROSTHETIC HEART VALVE;  Surgeon: Rexene Alberts, MD;  Location: [place];  Service: Open Heart Surgery;  Laterality: N/A;  . MULTIPLE EXTRACTIONS WITH ALVEOLOPLASTY N/A 03/30/2016   Procedure: EXTRACTION OF TOOTH #'S 2,5,6,11,15,16,20-30 AND 32 WITH ALVEOLOPLASTY AND BILATERAL MAXILLARY LATERAL EXOSTOSES REDUCTIONS;  Surgeon: Lenn Cal, DDS;  Location: [place];  Service: Oral Surgery;  Laterality: N/A;  . PATCH ANGIOPLASTY Right 03/10/2016   Procedure: PATCH ANGIOPLASTY CAROTID RIGHT USING Rueben Bash BIOLOGIC PATCH;  Surgeon: Serafina Mitchell, MD;  Location: [place];  Service: Vascular;  Laterality: Right;  . TEE WITHOUT CARDIOVERSION N/A 04/10/2013   Procedure:  TRANSESOPHAGEAL ECHOCARDIOGRAM (TEE);  Surgeon: [name], MD;  Location[facility] ENDOSCOPY;  Service: Cardiovascular;  Laterality: N/A;  . TEE WITHOUT CARDIOVERSION N/A 03/24/2016   Procedure: TRANSESOPHAGEAL ECHOCARDIOGRAM (TEE);  Surgeon: Jerline Pain, MD;  Location: [place];  Service: Cardiovascular;  Laterality: N/A;  . TEE WITHOUT CARDIOVERSION N/A 05/28/2016   Procedure: TRANSESOPHAGEAL ECHOCARDIOGRAM (TEE);  Surgeon: Rexene Alberts, MD;  Location: [place];  Service: Open Heart Surgery;  Laterality: N/A;  . TUBAL LIGATION      Social History   Social History  . Marital status: Married    Spouse name: N/A  . Number of children: 2  . Years of education: Associates   Occupational History  . Not on file.   Social History Main Topics  . Smoking status: Former Smoker    Packs/day: 1.00    Years: 5.00    Types: Cigarettes    Start date: 07/15/1970    Quit date: 05/11/1975  . Smokeless tobacco: Never Used     Comment: quit smoking 36 years ago  . Alcohol use No  Comment: drank years ago when young  . Drug use: No  . Sexual activity: Not on file   Other Topics Concern  . Not on file   Social History Narrative   Retired Archivist   Married   She has 2 grown children-   Daughter lives in [place]   Son lives in [place]   6 grandchildren        Family History  Problem Relation Age of Onset  . Diabetes Mother   . Stroke Mother   . Diabetes Father   . Heart disease Father        CHF  . Cancer Father        kidney  . Diabetes Sister   . Diabetes Brother   . Hypertension Daughter     ROS: no fevers or chills, productive cough, hemoptysis, dysphasia, odynophagia, melena, hematochezia, dysuria, hematuria, rash, seizure activity, orthopnea, PND, pedal edema, claudication. Remaining systems are negative.  Physical Exam: Well-developed well-nourished in no acute distress.  Skin is warm and dry.  HEENT is normal.  Neck is supple.  Chest is  clear to auscultation with normal expansion.  Cardiovascular exam is regular rate and rhythm. Crisp mechanical valve sound Abdominal exam nontender or distended. No masses palpated. Extremities show no edema. neuro grossly intact  ECG- Sinus rhythm with first-degree AV block. Anterior and inferior T-wave inversion. Prolonged QT interval. personally reviewed  A/P  1 Status post mitral valve replacement-continue SBE prophylaxis. Continue Coumadin and aspirin. Check Hgb.  2 paroxysmal atrial fibrillation/flutter-patient appears to be in sinus rhythm on examination. Continue metoprolol for rate control if atrial fibrillation recurs. Continue Coumadin.  3 hypertension-blood pressure is controlled. Continue present medications. Check K and renal function.  4 carotid artery disease-plan repeat carotid Dopplers November 2018. Continue statin.  5 hyperlipidemia-continue statin. Check LFTs.  6 coronary artery disease-continue aspirin and statin.  [name], MD

## 2017-02-14 NOTE — Telephone Encounter (Signed)
NOTES FAXED TO [facility] °

## 2017-02-15 ENCOUNTER — Telehealth: Payer: Self-pay | Admitting: Cardiology

## 2017-02-15 NOTE — Telephone Encounter (Signed)
Received records from [facility] for appointment on 02/24/17 with Dr [name].  Records put with Dr Jacalyn Lefevre schedule for 02/24/17. lp

## 2017-02-24 ENCOUNTER — Ambulatory Visit (INDEPENDENT_AMBULATORY_CARE_PROVIDER_SITE_OTHER): Payer: Medicare Other | Admitting: Cardiology

## 2017-02-24 ENCOUNTER — Encounter: Payer: Self-pay | Admitting: Cardiology

## 2017-02-24 VITALS — BP 118/64 | HR 68 | Ht 62.0 in | Wt 148.0 lb

## 2017-02-24 DIAGNOSIS — E7800 Pure hypercholesterolemia, unspecified: Secondary | ICD-10-CM

## 2017-02-24 DIAGNOSIS — I679 Cerebrovascular disease, unspecified: Secondary | ICD-10-CM

## 2017-02-24 DIAGNOSIS — I480 Paroxysmal atrial fibrillation: Secondary | ICD-10-CM | POA: Diagnosis not present

## 2017-02-24 DIAGNOSIS — I10 Essential (primary) hypertension: Secondary | ICD-10-CM

## 2017-02-24 NOTE — Patient Instructions (Signed)
Medication Instructions:   NO CHANGE  Labwork:  Your physician recommends that you HAVE LAB WORK TODAY  Testing/Procedures:  Your physician has requested that you have a carotid duplex. This test is an ultrasound of the carotid arteries in your neck. It looks at blood flow through these arteries that supply the brain with blood. Allow one hour for this exam. There are no restrictions or special instructions.DUE IN NOVEMBER    Follow-Up:  Your physician wants you to follow-up in: [place] will receive a reminder letter in the mail two months in advance. If you don't receive a letter, please call our office to schedule the follow-up appointment.   If you need a refill on your cardiac medications before your next appointment, please call your pharmacy.

## 2017-02-25 ENCOUNTER — Telehealth: Payer: Self-pay | Admitting: *Deleted

## 2017-02-25 DIAGNOSIS — Z0000 Encounter for general adult medical examination without abnormal findings: Secondary | ICD-10-CM

## 2017-02-25 DIAGNOSIS — R7989 Other specified abnormal findings of blood chemistry: Secondary | ICD-10-CM

## 2017-02-25 DIAGNOSIS — R945 Abnormal results of liver function studies: Principal | ICD-10-CM

## 2017-02-25 LAB — CBC
HEMOGLOBIN: 10.2 g/dL — AB (ref 11.1–15.9)
Hematocrit: 33.2 % — ABNORMAL LOW (ref 34.0–46.6)
MCH: 26.6 pg (ref 26.6–33.0)
MCHC: 30.7 g/dL — AB (ref 31.5–35.7)
MCV: 87 fL (ref 79–97)
Platelets: 113 10*3/uL — ABNORMAL LOW (ref 150–379)
RBC: 3.84 x10E6/uL (ref 3.77–5.28)
RDW: 17.1 % — ABNORMAL HIGH (ref 12.3–15.4)
WBC: 3.1 10*3/uL — AB (ref 3.4–10.8)

## 2017-02-25 LAB — COMPREHENSIVE METABOLIC PANEL
ALK PHOS: 146 IU/L — AB (ref 39–117)
ALT: 28 IU/L (ref 0–32)
AST: 48 IU/L — AB (ref 0–40)
Albumin/Globulin Ratio: 1 — ABNORMAL LOW (ref 1.2–2.2)
Albumin: 4.3 g/dL (ref 3.6–4.8)
BUN/Creatinine Ratio: 17 (ref 12–28)
BUN: 16 mg/dL (ref 8–27)
Bilirubin Total: 1.2 mg/dL (ref 0.0–1.2)
CO2: 28 mmol/L (ref 20–29)
CREATININE: 0.96 mg/dL (ref 0.57–1.00)
Calcium: 9.3 mg/dL (ref 8.7–10.3)
Chloride: 100 mmol/L (ref 96–106)
GFR calc Af Amer: 72 mL/min/{1.73_m2} (ref >59)
GFR calc non Af Amer: 62 mL/min/{1.73_m2} (ref >59)
GLUCOSE: 61 mg/dL — AB (ref 65–99)
Globulin, Total: 4.1 g/dL (ref 1.5–4.5)
Potassium: 4.2 mmol/L (ref 3.5–5.2)
Sodium: 140 mmol/L (ref 134–144)
Total Protein: 8.4 g/dL (ref 6.0–8.5)

## 2017-02-25 MED ORDER — ATORVASTATIN CALCIUM 80 MG PO TABS: 40.0000 mg | ORAL_TABLET | Freq: Every day | ORAL | 5 refills | Status: DC

## 2017-02-25 NOTE — Telephone Encounter (Addendum)
-----   Message from [name], MD sent at 02/25/2017  7:19 AM EDT ----- Pt should fu with primary care for mild pancytopenia Marie Malone  Change lipitor to 40 mg daily; lipids and liver 4 weeks  [place] with pt, she voiced medication changes. Lab orders mailed to the pt and results forwarded to PCP.

## 2017-02-27 ENCOUNTER — Telehealth: Payer: Self-pay | Admitting: Family

## 2017-02-27 DIAGNOSIS — D61818 Other pancytopenia: Secondary | ICD-10-CM

## 2017-02-27 NOTE — Telephone Encounter (Signed)
Please let pt know that I received lab work from her cardiologist.  Her blood counts are low.  I would like her to see hematology.

## 2017-03-02 NOTE — Telephone Encounter (Signed)
Notified pt and she voices understanding. Referral signed.

## 2017-03-09 ENCOUNTER — Other Ambulatory Visit: Payer: Self-pay | Admitting: Family

## 2017-03-15 ENCOUNTER — Ambulatory Visit (INDEPENDENT_AMBULATORY_CARE_PROVIDER_SITE_OTHER): Payer: Medicare Other | Admitting: Pharmacist Clinician (PhC)/ Clinical Pharmacy Specialist

## 2017-03-15 DIAGNOSIS — Z8673 Personal history of transient ischemic attack (TIA), and cerebral infarction without residual deficits: Secondary | ICD-10-CM

## 2017-03-15 DIAGNOSIS — I4891 Unspecified atrial fibrillation: Secondary | ICD-10-CM

## 2017-03-15 DIAGNOSIS — I480 Paroxysmal atrial fibrillation: Secondary | ICD-10-CM | POA: Diagnosis not present

## 2017-03-15 DIAGNOSIS — Z7901 Long term (current) use of anticoagulants: Secondary | ICD-10-CM

## 2017-03-15 LAB — POCT INR: INR: 1.9

## 2017-03-23 ENCOUNTER — Other Ambulatory Visit: Payer: Self-pay | Admitting: Family

## 2017-03-23 DIAGNOSIS — D61818 Other pancytopenia: Secondary | ICD-10-CM

## 2017-03-24 ENCOUNTER — Other Ambulatory Visit: Payer: Self-pay

## 2017-03-24 ENCOUNTER — Ambulatory Visit: Payer: Self-pay | Admitting: Family

## 2017-03-28 ENCOUNTER — Ambulatory Visit (HOSPITAL_COMMUNITY)
Admission: RE | Admit: 2017-03-28 | Discharge: 2017-03-28 | Disposition: A | Discharge status: Home / Self Care | Payer: Medicare Other | Source: Ambulatory Visit | Attending: Internal Medicine | Admitting: Internal Medicine

## 2017-03-28 DIAGNOSIS — I679 Cerebrovascular disease, unspecified: Secondary | ICD-10-CM | POA: Insufficient documentation

## 2017-03-28 LAB — HEPATIC FUNCTION PANEL
ALBUMIN: 4.2 g/dL (ref 3.6–4.8)
ALK PHOS: 161 IU/L — AB (ref 39–117)
ALT: 25 IU/L (ref 0–32)
AST: 46 IU/L — AB (ref 0–40)
BILIRUBIN TOTAL: 1.2 mg/dL (ref 0.0–1.2)
Bilirubin, Direct: 0.5 mg/dL — ABNORMAL HIGH (ref 0.00–0.40)
Total Protein: 8.3 g/dL (ref 6.0–8.5)

## 2017-03-28 LAB — LIPID PANEL
CHOLESTEROL TOTAL: 107 mg/dL (ref 100–199)
Chol/HDL Ratio: 1.8 ratio (ref 0.0–4.4)
HDL: 61 mg/dL (ref >39)
LDL CALC: 35 mg/dL (ref 0–99)
TRIGLYCERIDES: 53 mg/dL (ref 0–149)
VLDL CHOLESTEROL CAL: 11 mg/dL (ref 5–40)

## 2017-03-29 ENCOUNTER — Other Ambulatory Visit: Payer: Self-pay | Admitting: *Deleted

## 2017-03-29 DIAGNOSIS — R748 Abnormal levels of other serum enzymes: Secondary | ICD-10-CM

## 2017-04-11 ENCOUNTER — Ambulatory Visit (INDEPENDENT_AMBULATORY_CARE_PROVIDER_SITE_OTHER): Payer: Medicare Other | Admitting: Pharmacist Clinician (PhC)/ Clinical Pharmacy Specialist

## 2017-04-11 DIAGNOSIS — Z7901 Long term (current) use of anticoagulants: Secondary | ICD-10-CM

## 2017-04-11 DIAGNOSIS — I4891 Unspecified atrial fibrillation: Secondary | ICD-10-CM

## 2017-04-11 DIAGNOSIS — I480 Paroxysmal atrial fibrillation: Secondary | ICD-10-CM

## 2017-04-11 DIAGNOSIS — Z954 Presence of other heart-valve replacement: Secondary | ICD-10-CM | POA: Diagnosis not present

## 2017-04-11 LAB — POCT INR: INR: 2.9

## 2017-05-09 ENCOUNTER — Ambulatory Visit (INDEPENDENT_AMBULATORY_CARE_PROVIDER_SITE_OTHER): Payer: Medicare Other | Admitting: Pharmacist

## 2017-05-09 DIAGNOSIS — I4891 Unspecified atrial fibrillation: Secondary | ICD-10-CM | POA: Diagnosis not present

## 2017-05-09 DIAGNOSIS — I480 Paroxysmal atrial fibrillation: Secondary | ICD-10-CM

## 2017-05-09 DIAGNOSIS — Z7901 Long term (current) use of anticoagulants: Secondary | ICD-10-CM

## 2017-05-09 LAB — POCT INR: INR: 2.9

## 2017-05-11 ENCOUNTER — Other Ambulatory Visit: Payer: Self-pay | Admitting: Physician Assistant

## 2017-05-11 ENCOUNTER — Other Ambulatory Visit: Payer: Self-pay | Admitting: Family

## 2017-05-11 DIAGNOSIS — I10 Essential (primary) hypertension: Secondary | ICD-10-CM

## 2017-05-11 NOTE — Telephone Encounter (Signed)
90 day supply was sent to pharmacy. Pt is due for follow up with PCP now. Please call pt to schedule appointment. Thanks!

## 2017-05-11 NOTE — Telephone Encounter (Signed)
Rx has been sent to the pharmacy electronically. ° °

## 2017-05-11 NOTE — Telephone Encounter (Signed)
Please review for refill. Thanks!  

## 2017-05-23 ENCOUNTER — Encounter: Payer: Self-pay | Admitting: Thoracic Surgery (Cardiothoracic Vascular Surgery)

## 2017-05-23 ENCOUNTER — Ambulatory Visit (INDEPENDENT_AMBULATORY_CARE_PROVIDER_SITE_OTHER): Payer: Medicare Other | Admitting: Thoracic Surgery (Cardiothoracic Vascular Surgery)

## 2017-05-23 VITALS — BP 150/84 | HR 69 | Resp 20 | Ht 62.0 in | Wt 153.0 lb

## 2017-05-23 DIAGNOSIS — Z954 Presence of other heart-valve replacement: Secondary | ICD-10-CM

## 2017-05-23 DIAGNOSIS — Z9889 Other specified postprocedural states: Secondary | ICD-10-CM

## 2017-05-23 DIAGNOSIS — Z8679 Personal history of other diseases of the circulatory system: Secondary | ICD-10-CM | POA: Diagnosis not present

## 2017-05-23 DIAGNOSIS — Z951 Presence of aortocoronary bypass graft: Secondary | ICD-10-CM | POA: Diagnosis not present

## 2017-05-23 NOTE — Patient Instructions (Signed)
Continue all previous medications without any changes at this time  Work with the pharmacists at the Coumadin clinic to adjust your diet and Coumadin (warfarin) dose to keep your INR under strict control with goal INR  3.0 (range between 2.5 and 3.5).  Make every effort to maintain a consistent diet and do not let your prescription for Coumadin (warfarin) run out without discussing with your cardiologist.  Endocarditis is a potentially serious infection of heart valves or inside lining of the heart.  It occurs more commonly in patients with diseased heart valves (such as patient's with aortic or mitral valve disease) and in patients who have undergone heart valve repair or replacement.  Certain surgical and dental procedures may put you at risk, such as dental cleaning, other dental procedures, or any surgery involving the respiratory, urinary, gastrointestinal tract, gallbladder or prostate gland.   To minimize your chances for develooping endocarditis, maintain good oral health and seek prompt medical attention for any infections involving the mouth, teeth, gums, skin or urinary tract.    Always notify your doctor or dentist about your underlying heart valve condition before having any invasive procedures. You will need to take antibiotics before certain procedures, including all routine dental cleanings or other dental procedures.  Your cardiologist or dentist should prescribe these antibiotics for you to be taken ahead of time.

## 2017-05-23 NOTE — Progress Notes (Signed)
[address]       [place] [address]             [phone]     CARDIOTHORACIC SURGERY OFFICE NOTE  Referring Provider is [name], [name], MD PCP is Debbrah Alar, NP   HPI:  Patient is a 67 year old African-American female with long-standing rheumatic heart disease, chronic diastolic congestive heart failure, recurrent paroxysmal atrial fibrillation, 2 previous embolic strokes, and cerebrovascular disease who returns to the office today for routine follow-up status post mitral valve replacement using a bileaflet mechanical prosthetic valve, coronary artery bypass grafting 1, and Maze procedure on 05/28/2016.  She was last seen in our office on August 30, 2016 at which time she was doing well and maintaining sinus rhythm.  Since then she has been seen in follow-up in the atrial fibrillation clinic and by [name], most recently on February 24, 2017.  Since then she has continued to do very well.  She has been followed regularly in the Coumadin clinic and her last several checks have remained stable with her most recent INR reported 2.9.  She is back to essentially normal activity and she feels "much improved" in comparison with how she felt prior to surgery.  She states that currently she only gets short of breath with strenuous exertion that does not limit her at all.  She has not had any significant chest pain or chest tightness.  She can lay flat in bed when she sleeps.  She has occasional mild lower extremity edema.  She has not had palpitations and she feels as though her heart is staying in rhythm.  She states that she has been seen by general surgeon for symptomatic cholelithiasis.  Elective cholecystectomy has been postponed for the time being.   Current Outpatient Medications  Medication Sig Dispense Refill  . aspirin EC 81 MG EC tablet Take 1 tablet (81 mg total) by mouth daily.    [name] Kitchen atorvastatin (LIPITOR) 80 MG tablet Take 0.5 tablets (40 mg total)  by mouth daily at 6 PM. 30 tablet 5  . chlorhexidine (PERIDEX) 0.12 % solution Rinse with 15 mLs bid after breakfast and at bedtime.  Use in a swish and spit manner. 480 mL prn  . docusate sodium (COLACE) 100 MG capsule Take 1 capsule (100 mg total) by mouth 2 (two) times daily. 10 capsule 0  . feeding supplement (BOOST / RESOURCE BREEZE) LIQD Take 1 Container by mouth 2 (two) times daily between meals.    . furosemide (LASIX) 40 MG tablet TAKE 1 TABLET BY MOUTH ONCE DAILY 30 tablet 3  . losartan (COZAAR) 25 MG tablet TAKE 1 TABLET BY MOUTH ONCE DAILY 90 tablet 1  . metoprolol succinate (TOPROL-XL) 50 MG 24 hr tablet TAKE 1 TABLET BY MOUTH ONCE DAILY WITH OR IMMEDIATELY FOLLOWING MEALS 90 tablet 0  . polyethylene glycol (MIRALAX / GLYCOLAX) packet Take 17 g by mouth daily. (Patient taking differently: Take 17 g by mouth daily as needed. ) 14 each 0  . warfarin (COUMADIN) 4 MG tablet TAKE 1 TO 1 & 1/2 (ONE & ONE-HALF) TABLETS BY MOUTH ONCE DAILY AS DIRECTED BY THE COUMADIN CLINIC 40 tablet 2   No current facility-administered medications for this visit.       Physical Exam:   BP (!) 150/84   Pulse 69   Resp 20   Ht 5\' 2"  (1.575 m)   Wt 153 lb (69.4 kg)   SpO2 94% Comment:  RA  BMI 27.98 kg/m   General:  Well-appearing  Chest:   Clear to auscultation  CV:   Regular rate and rhythm with crisp mechanical heart valve sounds  Incisions:  Completely healed  Abdomen:  Soft nontender  Extremities:  Warm and well perfused with trace lower extremity edema  Diagnostic Tests:  2 channel telemetry rhythm strip demonstrates normal sinus rhythm   Impression:  Patient is doing very well and maintaining sinus rhythm approximately 1 year status post mitral valve replacement using a bileaflet mechanical prosthetic valve, coronary artery bypass grafting x1, and Maze procedure  Plan:  We have not recommended any changes the patient's current medications.  The patient has been reminded regarding  the importance of dental hygiene and the lifelong need for antibiotic prophylaxis for all dental cleanings and other related invasive procedures.  I think it would be reasonable for the patient to proceed with elective cholecystectomy if indicated whenever it is convenient.  In the future the patient will call and return to see [place] as needed.  She will continue to follow-up regularly with [name].  I spent in excess of 15 minutes during the conduct of this office consultation and >50% of this time involved direct face-to-face encounter with the patient for counseling and/or coordination of their care.    Valentina Gu. Roxy Manns, MD 05/23/2017 1:54 PM

## 2017-05-30 ENCOUNTER — Encounter: Payer: Self-pay | Admitting: Thoracic Surgery (Cardiothoracic Vascular Surgery)

## 2017-06-06 ENCOUNTER — Encounter: Payer: Self-pay | Admitting: Family

## 2017-06-06 ENCOUNTER — Ambulatory Visit: Payer: Medicare Other | Admitting: Family

## 2017-06-06 VITALS — BP 154/71 | HR 66 | Temp 98.7°F | Resp 16 | Ht 62.0 in | Wt 155.0 lb

## 2017-06-06 DIAGNOSIS — I480 Paroxysmal atrial fibrillation: Secondary | ICD-10-CM

## 2017-06-06 DIAGNOSIS — R609 Edema, unspecified: Secondary | ICD-10-CM

## 2017-06-06 DIAGNOSIS — R0602 Shortness of breath: Secondary | ICD-10-CM

## 2017-06-06 DIAGNOSIS — R5383 Other fatigue: Secondary | ICD-10-CM | POA: Diagnosis not present

## 2017-06-06 NOTE — Progress Notes (Signed)
Subjective:    Patient ID: Marie Malone, female    DOB: 01-30-1951, 67 y.o.   MRN: [id number]  HPI  Patient is a 67 yr old female who presents today with chief complaint of fatigue.  Reports that fatigue has worsened over the last 2 weeks.  Also reports bilateral LE edema R>L. Better first thing in th AM.    Wt Readings from Last 3 Encounters:  06/06/17 155 lb (70.3 kg)  05/23/17 153 lb (69.4 kg)  02/24/17 148 lb (67.1 kg)    Review of Systems  Constitutional: Negative for appetite change.  HENT:       Reports + nasal congestion for a few weeks. Has tried benadryl which seems to help briefly  Respiratory:       Has some DOE with prolonged walking, might be a little worse than baseline.   Cardiovascular: Negative for chest pain.  Musculoskeletal: Negative for arthralgias.   See HPI  Past Medical History:  Diagnosis Date  . Atrial fibrillation ([place])   . Chronic diastolic CHF (congestive heart failure) ([place])   . Complication of anesthesia    difficult to wake up from anesthesia  . Coronary artery disease involving native coronary artery without angina pectoris 03/26/2016   100% chronic occlusion of RCA  . Dyspnea   . Heart murmur   . History of rheumatic fever    as a child  . Hyperlipidemia   . Hypertension   . Mitral stenosis   . OSA (obstructive sleep apnea)    mild per sleep study 2008  . Paroxysmal atrial fibrillation (H[facility])   . Pneumonia    double pneumonia once  . Pulmonary hypertension ([place])   . Rheumatic mitral regurgitation   . S/P balloon mitral valvuloplasty 2008   . 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 S/P mitral valve replacement with mechanical valve 05/28/2016   29 mm Sorin Carbomedics Optiform bileaflet mechanical prosthetic valve  . Stroke [facility])    x2     Social History   Socioeconomic History  . Marital status: Married    Spouse name: Not on file  . Number of children: 2  . Years of education: Associates  .  Highest education level: Not on file  Social Needs  . Financial resource strain: Not on file  . Food insecurity - worry: Not on file  . Food insecurity - inability: Not on file  . Transportation needs - medical: Not on file  . Transportation needs - non-medical: Not on file  Occupational History  . Not on file  Tobacco Use  . Smoking status: Former Smoker    Packs/day: 1.00    Years: 5.00    Pack years: 5.00    Types: Cigarettes    Start date: 07/15/1970    Last attempt to quit: 05/11/1975    Years since quitting: 42.1  . Smokeless tobacco: Never Used  . Tobacco comment: quit smoking 36 years ago  Substance and Sexual Activity  . Alcohol use: No    Alcohol/week: 0.0 oz    Comment: drank years ago when young  . Drug use: No  . Sexual activity: Not on file  Other Topics Concern  . Not on file  Social History Narrative   Retired Archivist   Married   She has 2 grown children-   Daughter lives in [place]   Son lives in [place]   6 grandchildren  Past Surgical History:  Procedure Laterality Date  . APPENDECTOMY  1989   appendix ruptured,had peritonitis  . BALLOON VALVULOPLASTY  2008   [facility]  . CARDIAC CATHETERIZATION N/A 03/25/2016   Procedure: Right/Left Heart Cath and Coronary Angiography;  Surgeon: Jettie Booze, MD;  Location: [place] [facility];  Service: Cardiovascular;  Laterality: N/A;  . COLONOSCOPY W/ POLYPECTOMY    . CORONARY ARTERY BYPASS GRAFT N/A 05/28/2016   Procedure: CORONARY ARTERY BYPASS GRAFTING (CABG) x 1 WITH ENDOSCOPIC HARVESTING OF RIGHT SAPHENOUS VEIN, EVH- PDA;  Surgeon: Rexene Alberts, MD;  Location: [place];  Service: Open Heart Surgery;  Laterality: N/A;  . ENDARTERECTOMY Right 03/10/2016   Procedure: ENDARTERECTOMY CAROTID RIGHT;  Surgeon: Serafina Mitchell, MD;  Location: [place];  Service: Vascular;  Laterality: Right;  . EP IMPLANTABLE DEVICE N/A 03/11/2016   Procedure: Loop Recorder Removal;  Surgeon: Deboraha Sprang, MD;   Location: [place] CV LAB;  Service: Cardiovascular;  Laterality: N/A;  . 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 LOOP RECORDER IMPLANT N/A 04/10/2013   Procedure: LOOP RECORDER IMPLANT;  Surgeon: Coralyn Mark, MD;  Location: [place] CATH LAB;  Service: Cardiovascular;  Laterality: N/A;  . MAZE N/A 05/28/2016   Procedure: MAZE PROCEDURE AND APPLICATION OF  ATRICLIP LAA PROCLIP II 59 MM;  Surgeon: Rexene Alberts, MD;  Location: [place];  Service: Open Heart Surgery;  Laterality: N/A;  . MITRAL VALVE REPLACEMENT N/A 05/28/2016   Procedure: MITRAL VALVE (MV) REPLACEMENT USING 29 MM CARBOMEDICS OPTIFORM MECHANICAL BILEAFLET PROSTHETIC HEART VALVE;  Surgeon: Rexene Alberts, MD;  Location: [place];  Service: Open Heart Surgery;  Laterality: N/A;  . MULTIPLE EXTRACTIONS WITH ALVEOLOPLASTY N/A 03/30/2016   Procedure: EXTRACTION OF TOOTH #'S 2,5,6,11,15,16,20-30 AND 32 WITH ALVEOLOPLASTY AND BILATERAL MAXILLARY LATERAL EXOSTOSES REDUCTIONS;  Surgeon: Lenn Cal, DDS;  Location: [place];  Service: Oral Surgery;  Laterality: N/A;  . PATCH ANGIOPLASTY Right 03/10/2016   Procedure: PATCH ANGIOPLASTY CAROTID RIGHT USING Rueben Bash BIOLOGIC PATCH;  Surgeon: Serafina Mitchell, MD;  Location: [place];  Service: Vascular;  Laterality: Right;  . TEE WITHOUT CARDIOVERSION N/A 04/10/2013   Procedure: TRANSESOPHAGEAL ECHOCARDIOGRAM (TEE);  Surgeon: [name], MD;  Location: [facility] ENDOSCOPY;  Service: Cardiovascular;  Laterality: N/A;  . TEE WITHOUT CARDIOVERSION N/A 03/24/2016   Procedure: TRANSESOPHAGEAL ECHOCARDIOGRAM (TEE);  Surgeon: Jerline Pain, MD;  Location: [place];  Service: Cardiovascular;  Laterality: N/A;  . TEE WITHOUT CARDIOVERSION N/A 05/28/2016   Procedure: TRANSESOPHAGEAL ECHOCARDIOGRAM (TEE);  Surgeon: Rexene Alberts, MD;  Location: [place];  Service: Open Heart Surgery;  Laterality: N/A;  . TUBAL LIGATION      Family History  Problem Relation  Age of Onset  . Diabetes Mother   . Stroke Mother   . Diabetes Father   . Heart disease Father        CHF  . Cancer Father        kidney  . Diabetes Sister   . Diabetes Brother   . Hypertension Daughter     Allergies  Allergen Reactions  . No Known Allergies     Current Outpatient Medications on File Prior to Visit  Medication Sig Dispense Refill  . aspirin EC 81 MG EC tablet Take 1 tablet (81 mg total) by mouth daily.    [name] Kitchen atorvastatin (LIPITOR) 80 MG tablet Take 0.5 tablets (40 mg total) by mouth daily  at 6 PM. 30 tablet 5  . chlorhexidine (PERIDEX) 0.12 % solution Rinse with 15 mLs bid after breakfast and at bedtime.  Use in a swish and spit manner. 480 mL prn  . docusate sodium (COLACE) 100 MG capsule Take 1 capsule (100 mg total) by mouth 2 (two) times daily. 10 capsule 0  . feeding supplement (BOOST / RESOURCE BREEZE) LIQD Take 1 Container by mouth 2 (two) times daily between meals.    . furosemide (LASIX) 40 MG tablet TAKE 1 TABLET BY MOUTH ONCE DAILY 30 tablet 3  . losartan (COZAAR) 25 MG tablet TAKE 1 TABLET BY MOUTH ONCE DAILY 90 tablet 1  . metoprolol succinate (TOPROL-XL) 50 MG 24 hr tablet TAKE 1 TABLET BY MOUTH ONCE DAILY WITH OR IMMEDIATELY FOLLOWING MEALS 90 tablet 0  . polyethylene glycol (MIRALAX / GLYCOLAX) packet Take 17 g by mouth daily. (Patient taking differently: Take 17 g by mouth daily as needed. ) 14 each 0  . warfarin (COUMADIN) 4 MG tablet TAKE 1 TO 1 & 1/2 (ONE & ONE-HALF) TABLETS BY MOUTH ONCE DAILY AS DIRECTED BY THE COUMADIN CLINIC 40 tablet 2   No current facility-administered medications on file prior to visit.     BP (!) 154/71 (BP Location: Left Arm, Patient Position: Sitting, Cuff Size: Small)   Pulse 66   Temp 98.7 F (37.1 C) (Oral)   Resp 16   Ht 5\' 2"  (1.575 m)   Wt 155 lb (70.3 kg)   SpO2 100%   BMI 28.35 kg/m       Objective:   Physical Exam  Constitutional: She is oriented to person, place, and time. She appears  well-developed and well-nourished.  HENT:  Head: Normocephalic and atraumatic.  Cardiovascular: Normal rate, regular rhythm and normal heart sounds.  No murmur heard. Pulmonary/Chest: Effort normal and breath sounds normal. No respiratory distress. She has no wheezes.  Musculoskeletal:  2+ bilateral LE edema  Neurological: She is alert and oriented to person, place, and time.  Psychiatric: She has a normal mood and affect. Her behavior is normal. Judgment and thought content normal.          Assessment & Plan:  Fatigue- will check cmet, cbc, TSH.  I suspect that her symptoms are related to a viral illness however.  Will plan to see her back in 2 more weeks.    EKG is performed and personally reviewed.  Notes AF, has hx of PAF, rate stable on coumadin.   Edema- she is up 2 pounds advised her to take second dose of lasix this afternoon.  Then return to once daily dosing. Follow up in 2 weeks.

## 2017-06-06 NOTE — Patient Instructions (Signed)
Please take an extra dose of lasix tonight. Complete lab work prior to leaving.  Call if you gain >3 pounds overnight or 5 pounds in 1 week.

## 2017-06-07 ENCOUNTER — Telehealth: Payer: Self-pay | Admitting: Family

## 2017-06-07 DIAGNOSIS — E876 Hypokalemia: Secondary | ICD-10-CM

## 2017-06-07 LAB — CBC WITH DIFFERENTIAL/PLATELET
BASOS ABS: 0 10*3/uL (ref 0.0–0.1)
BASOS PCT: 0.9 % (ref 0.0–3.0)
EOS ABS: 0.1 10*3/uL (ref 0.0–0.7)
Eosinophils Relative: 3.1 % (ref 0.0–5.0)
HEMATOCRIT: 34.1 % — AB (ref 36.0–46.0)
HEMOGLOBIN: 11.1 g/dL — AB (ref 12.0–15.0)
Lymphocytes Relative: 22.6 % (ref 12.0–46.0)
Lymphs Abs: 0.7 10*3/uL (ref 0.7–4.0)
MCHC: 32.4 g/dL (ref 30.0–36.0)
MCV: 86.7 fl (ref 78.0–100.0)
MONO ABS: 0.4 10*3/uL (ref 0.1–1.0)
Monocytes Relative: 12.2 % — ABNORMAL HIGH (ref 3.0–12.0)
Neutro Abs: 1.9 10*3/uL (ref 1.4–7.7)
Neutrophils Relative %: 61.2 % (ref 43.0–77.0)
Platelets: 101 10*3/uL — ABNORMAL LOW (ref 150.0–400.0)
RBC: 3.93 Mil/uL (ref 3.87–5.11)
RDW: 17.4 % — AB (ref 11.5–15.5)
WBC: 3.1 10*3/uL — AB (ref 4.0–10.5)

## 2017-06-07 LAB — COMPREHENSIVE METABOLIC PANEL
ALBUMIN: 4.1 g/dL (ref 3.5–5.2)
ALK PHOS: 112 U/L (ref 39–117)
ALT: 24 U/L (ref 0–35)
AST: 50 U/L — AB (ref 0–37)
BILIRUBIN TOTAL: 2.3 mg/dL — AB (ref 0.2–1.2)
BUN: 15 mg/dL (ref 6–23)
CALCIUM: 9.3 mg/dL (ref 8.4–10.5)
CHLORIDE: 103 meq/L (ref 96–112)
CO2: 29 mEq/L (ref 19–32)
CREATININE: 0.87 mg/dL (ref 0.40–1.20)
GFR: 83.92 mL/min (ref >60.00)
Glucose, Bld: 67 mg/dL — ABNORMAL LOW (ref 70–99)
Potassium: 3 mEq/L — ABNORMAL LOW (ref 3.5–5.1)
SODIUM: 139 meq/L (ref 135–145)
TOTAL PROTEIN: 8.9 g/dL — AB (ref 6.0–8.3)

## 2017-06-07 LAB — TSH: TSH: 1.9 u[IU]/mL (ref 0.35–4.50)

## 2017-06-07 MED ORDER — POTASSIUM CHLORIDE CRYS ER 20 MEQ PO TBCR
20.0000 meq | EXTENDED_RELEASE_TABLET | Freq: Every day | ORAL | 3 refills | Status: DC
Start: 2017-06-07 — End: 2017-06-15

## 2017-06-07 NOTE — Telephone Encounter (Signed)
Patient notified and verbalized understanding. I have scheduled patient to have repeat lab work.

## 2017-06-07 NOTE — Telephone Encounter (Signed)
Please contact patient and let her know that her potassium is low.  I have sent a prescription to her pharmacy for a potassium supplement.  I would like her to take 2 tablets today then 1 tablet daily.  Follow-up in 1 week for repeat lab work.

## 2017-06-13 ENCOUNTER — Ambulatory Visit (INDEPENDENT_AMBULATORY_CARE_PROVIDER_SITE_OTHER): Payer: Medicare Other | Admitting: Pharmacist Clinician (PhC)/ Clinical Pharmacy Specialist

## 2017-06-13 DIAGNOSIS — I4891 Unspecified atrial fibrillation: Secondary | ICD-10-CM | POA: Diagnosis not present

## 2017-06-13 DIAGNOSIS — I480 Paroxysmal atrial fibrillation: Secondary | ICD-10-CM | POA: Diagnosis not present

## 2017-06-13 DIAGNOSIS — Z7901 Long term (current) use of anticoagulants: Secondary | ICD-10-CM | POA: Diagnosis not present

## 2017-06-13 LAB — POCT INR: INR: 3.1

## 2017-06-14 ENCOUNTER — Other Ambulatory Visit (INDEPENDENT_AMBULATORY_CARE_PROVIDER_SITE_OTHER): Payer: Medicare Other

## 2017-06-14 DIAGNOSIS — E876 Hypokalemia: Secondary | ICD-10-CM

## 2017-06-14 LAB — BASIC METABOLIC PANEL
BUN: 18 mg/dL (ref 6–23)
CO2: 30 mEq/L (ref 19–32)
Calcium: 9.6 mg/dL (ref 8.4–10.5)
Chloride: 106 mEq/L (ref 96–112)
Creatinine, Ser: 0.92 mg/dL (ref 0.40–1.20)
GFR: 78.68 mL/min (ref >60.00)
GLUCOSE: 102 mg/dL — AB (ref 70–99)
Potassium: 4.6 mEq/L (ref 3.5–5.1)
SODIUM: 142 meq/L (ref 135–145)

## 2017-06-15 ENCOUNTER — Telehealth: Payer: Self-pay | Admitting: Family

## 2017-06-15 DIAGNOSIS — E876 Hypokalemia: Secondary | ICD-10-CM

## 2017-06-15 MED ORDER — POTASSIUM CHLORIDE ER 10 MEQ PO TBCR: 10.0000 meq | EXTENDED_RELEASE_TABLET | Freq: Every day | ORAL | 3 refills | Status: DC

## 2017-06-15 NOTE — Telephone Encounter (Signed)
Please let the patient know that her potassium level has come up.  I actually think that we may need to decrease the potassium supplement dose.  I would like to bring her down to K-Dur 10 milliequivalents by mouth once daily.  She should recheck basic metabolic panel in 2 weeks.

## 2017-06-15 NOTE — Telephone Encounter (Signed)
Patient notified and verbalized understanding. She states she will just schedule the lab appointment when she comes in next week.

## 2017-06-19 ENCOUNTER — Other Ambulatory Visit: Payer: Self-pay | Admitting: Cardiology

## 2017-06-19 DIAGNOSIS — I480 Paroxysmal atrial fibrillation: Secondary | ICD-10-CM

## 2017-06-19 DIAGNOSIS — I4891 Unspecified atrial fibrillation: Secondary | ICD-10-CM

## 2017-06-20 ENCOUNTER — Ambulatory Visit (INDEPENDENT_AMBULATORY_CARE_PROVIDER_SITE_OTHER): Payer: Medicare Other | Admitting: Family

## 2017-06-20 ENCOUNTER — Encounter: Payer: Self-pay | Admitting: Family

## 2017-06-20 VITALS — BP 139/72 | HR 67 | Temp 98.2°F | Resp 16 | Ht 62.0 in | Wt 150.2 lb

## 2017-06-20 DIAGNOSIS — E876 Hypokalemia: Secondary | ICD-10-CM

## 2017-06-20 DIAGNOSIS — R5383 Other fatigue: Secondary | ICD-10-CM | POA: Diagnosis not present

## 2017-06-20 DIAGNOSIS — R609 Edema, unspecified: Secondary | ICD-10-CM

## 2017-06-20 DIAGNOSIS — Z23 Encounter for immunization: Secondary | ICD-10-CM | POA: Diagnosis not present

## 2017-06-20 NOTE — Addendum Note (Signed)
Addended by: Wynonia Musty A on: 06/20/2017 02:33 PM   Modules accepted: Orders

## 2017-06-20 NOTE — Progress Notes (Signed)
Subjective:    Patient ID: Marie Malone, female    DOB: 09-Sep-1950, 67 y.o.   MRN: [id number]  HPI  Patient is a 67 yr old female who presents today for follow up. We last saw her on 06/06/17 due to complaint of fatigue.  She also had complaints of lower extremity edema last visit.  We advised her take a second dose of Lasix the day we saw her then returned to her once daily dosing.  Wt Readings from Last 3 Encounters:  06/20/17 150 lb 3.2 oz (68.1 kg)  06/06/17 155 lb (70.3 kg)  05/23/17 153 lb (69.4 kg)   Lab work completed last visit noted normal TSH, slightly improved anemia with a hemoglobin of 11.1, depressed white count at 3.1 which is the patient's baseline, thrombocytopenia with platelet count of 101 K which is also patient's baseline.  Initial potassium was low at 3.0.  Follow-up potassium after supplementation was upper limit normal. Reports improvement in her LE edema and fatigue. Denies SOB or chest pain.    Review of Systems    see HPI  Past Medical History:  Diagnosis Date  . Atrial fibrillation ([place])   . Chronic diastolic CHF (congestive heart failure) ([place])   . Complication of anesthesia    difficult to wake up from anesthesia  . Coronary artery disease involving native coronary artery without angina pectoris 03/26/2016   100% chronic occlusion of RCA  . Dyspnea   . Heart murmur   . History of rheumatic fever    as a child  . Hyperlipidemia   . Hypertension   . Mitral stenosis   . OSA (obstructive sleep apnea)    mild per sleep study 2008  . Paroxysmal atrial fibrillation (H[facility])   . Pneumonia    double pneumonia once  . Pulmonary hypertension ([place])   . Rheumatic mitral regurgitation   . S/P balloon mitral valvuloplasty 2008   . 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 S/P mitral valve replacement with mechanical valve 05/28/2016   29 mm Sorin Carbomedics Optiform bileaflet mechanical prosthetic valve  . Stroke [facility])    x2     Social History   Socioeconomic History  . Marital status: Married    Spouse name: Not on file  . Number of children: 2  . Years of education: Associates  . Highest education level: Not on file  Social Needs  . Financial resource strain: Not on file  . Food insecurity - worry: Not on file  . Food insecurity - inability: Not on file  . Transportation needs - medical: Not on file  . Transportation needs - non-medical: Not on file  Occupational History  . Not on file  Tobacco Use  . Smoking status: Former Smoker    Packs/day: 1.00    Years: 5.00    Pack years: 5.00    Types: Cigarettes    Start date: 07/15/1970    Last attempt to quit: 05/11/1975    Years since quitting: 42.1  . Smokeless tobacco: Never Used  . Tobacco comment: quit smoking 36 years ago  Substance and Sexual Activity  . Alcohol use: No    Alcohol/week: 0.0 oz    Comment: drank years ago when young  . Drug use: No  . Sexual activity: Not on file  Other Topics Concern  . 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 APPENDECTOMY  1989   appendix ruptured,had peritonitis  . BALLOON VALVULOPLASTY  2008   [facility]  . CARDIAC CATHETERIZATION N/A 03/25/2016   Procedure: Right/Left Heart Cath and Coronary Angiography;  Surgeon: Jettie Booze, MD;  Location: [place] CV LAB;  Service: Cardiovascular;  Laterality: N/A;  . COLONOSCOPY W/ POLYPECTOMY    . CORONARY ARTERY BYPASS GRAFT N/A 05/28/2016   Procedure: CORONARY ARTERY BYPASS GRAFTING (CABG) x 1 WITH ENDOSCOPIC HARVESTING OF RIGHT SAPHENOUS VEIN, EVH- PDA;  Surgeon: Rexene Alberts, MD;  Location: [place];  Service: Open Heart Surgery;  Laterality: N/A;  . ENDARTERECTOMY Right 03/10/2016   Procedure: ENDARTERECTOMY CAROTID RIGHT;  Surgeon: Serafina Mitchell, MD;   Location: [place];  Service: Vascular;  Laterality: Right;  . EP IMPLANTABLE DEVICE N/A 03/11/2016   Procedure: Loop Recorder Removal;  Surgeon: Deboraha Sprang, MD;  Location: [place] CV LAB;  Service: Cardiovascular;  Laterality: N/A;  . 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 LOOP RECORDER IMPLANT N/A 04/10/2013   Procedure: LOOP RECORDER IMPLANT;  Surgeon: Coralyn Mark, MD;  Location: [place] CATH LAB;  Service: Cardiovascular;  Laterality: N/A;  . MAZE N/A 05/28/2016   Procedure: MAZE PROCEDURE AND APPLICATION OF  ATRICLIP LAA PROCLIP II 54 MM;  Surgeon: Rexene Alberts, MD;  Location: [place];  Service: Open Heart Surgery;  Laterality: N/A;  . MITRAL VALVE REPLACEMENT N/A 05/28/2016   Procedure: MITRAL VALVE (MV) REPLACEMENT USING 29 MM CARBOMEDICS OPTIFORM MECHANICAL BILEAFLET PROSTHETIC HEART VALVE;  Surgeon: Rexene Alberts, MD;  Location: [place];  Service: Open Heart Surgery;  Laterality: N/A;  . MULTIPLE EXTRACTIONS WITH ALVEOLOPLASTY N/A 03/30/2016   Procedure: EXTRACTION OF TOOTH #'S 2,5,6,11,15,16,20-30 AND 32 WITH ALVEOLOPLASTY AND BILATERAL MAXILLARY LATERAL EXOSTOSES REDUCTIONS;  Surgeon: Lenn Cal, DDS;  Location: [place];  Service: Oral Surgery;  Laterality: N/A;  . PATCH ANGIOPLASTY Right 03/10/2016   Procedure: PATCH ANGIOPLASTY CAROTID RIGHT USING Rueben Bash BIOLOGIC PATCH;  Surgeon: Serafina Mitchell, MD;  Location: [place];  Service: Vascular;  Laterality: Right;  . TEE WITHOUT CARDIOVERSION N/A 04/10/2013   Procedure: TRANSESOPHAGEAL ECHOCARDIOGRAM (TEE);  Surgeon: [name], MD;  Location: [facility] ENDOSCOPY;  Service: Cardiovascular;  Laterality: N/A;  . TEE WITHOUT CARDIOVERSION N/A 03/24/2016   Procedure: TRANSESOPHAGEAL ECHOCARDIOGRAM (TEE);  Surgeon: Jerline Pain, MD;  Location: [place];  Service: Cardiovascular;  Laterality: N/A;  . TEE WITHOUT CARDIOVERSION N/A 05/28/2016   Procedure: TRANSESOPHAGEAL ECHOCARDIOGRAM  (TEE);  Surgeon: Rexene Alberts, MD;  Location: [place];  Service: Open Heart Surgery;  Laterality: N/A;  . TUBAL LIGATION      Family History  Problem Relation Age of Onset  . Diabetes Mother   . Stroke Mother   . Diabetes Father   . Heart disease Father        CHF  . Cancer Father        kidney  . Diabetes Sister   . Diabetes Brother   . Hypertension Daughter     Allergies  Allergen Reactions  . No Known Allergies     Current Outpatient Medications on File Prior to Visit  Medication Sig Dispense Refill  . aspirin EC 81 MG EC tablet Take 1  tablet (81 mg total) by mouth daily.    [name] Kitchen atorvastatin (LIPITOR) 80 MG tablet Take 0.5 tablets (40 mg total) by mouth daily at 6 PM. 30 tablet 5  . chlorhexidine (PERIDEX) 0.12 % solution Rinse with 15 mLs bid after breakfast and at bedtime.  Use in a swish and spit manner. 480 mL prn  . docusate sodium (COLACE) 100 MG capsule Take 1 capsule (100 mg total) by mouth 2 (two) times daily. 10 capsule 0  . feeding supplement (BOOST / RESOURCE BREEZE) LIQD Take 1 Container by mouth 2 (two) times daily between meals.    . furosemide (LASIX) 40 MG tablet TAKE 1 TABLET BY MOUTH ONCE DAILY 30 tablet 3  . losartan (COZAAR) 25 MG tablet TAKE 1 TABLET BY MOUTH ONCE DAILY 90 tablet 1  . metoprolol succinate (TOPROL-XL) 50 MG 24 hr tablet TAKE 1 TABLET BY MOUTH ONCE DAILY WITH OR IMMEDIATELY FOLLOWING MEALS 90 tablet 0  . polyethylene glycol (MIRALAX / GLYCOLAX) packet Take 17 g by mouth daily. (Patient taking differently: Take 17 g by mouth daily as needed. ) 14 each 0  . potassium chloride (K-DUR) 10 MEQ tablet Take 1 tablet (10 mEq total) by mouth daily. 30 tablet 3  . warfarin (COUMADIN) 4 MG tablet TAKE 1 TO 1 & 1/2 (ONE & ONE-HALF) TABLETS BY MOUTH ONCE DAILY AS DIRECTED BY THE COUMADIN CLINIC 40 tablet 2   No current facility-administered medications on file prior to visit.     BP 139/72 (BP Location: Left Arm, Patient Position: Sitting, Cuff Size:  Normal)   Pulse 67   Temp 98.2 F (36.8 C) (Oral)   Resp 16   Ht 5\' 2"  (1.575 m)   Wt 150 lb 3.2 oz (68.1 kg)   SpO2 100%   BMI 27.47 kg/m    Objective:   Physical Exam  Constitutional: She is oriented to person, place, and time. She appears well-developed and well-nourished.  HENT:  Head: Normocephalic and atraumatic.  Cardiovascular: Normal rate, regular rhythm and normal heart sounds.  No murmur heard. Pulmonary/Chest: Effort normal and breath sounds normal. No respiratory distress. She has no wheezes.  Musculoskeletal:  Trace bilateral LE edema  Neurological: She is alert and oriented to person, place, and time.  Skin: Skin is warm and dry.  Psychiatric: She has a normal mood and affect. Her behavior is normal. Judgment and thought content normal.          Assessment & Plan:  Fatigue- improved.  Suspect that this was related to viral illness.  Edema- improved.  Continue lasix.  Hypokalemia- check follow up bmet today. Repeat K+ was upper limit normal so we decreased kdur from 64meq daily to 67meq daily.

## 2017-06-20 NOTE — Patient Instructions (Signed)
Please complete lab work prior to leaving.   

## 2017-06-20 NOTE — Addendum Note (Signed)
Addended by: Caffie Pinto on: 06/20/2017 02:32 PM   Modules accepted: Orders

## 2017-06-21 LAB — BASIC METABOLIC PANEL
BUN: 17 mg/dL (ref 6–23)
CALCIUM: 9.2 mg/dL (ref 8.4–10.5)
CO2: 29 mEq/L (ref 19–32)
CREATININE: 0.93 mg/dL (ref 0.40–1.20)
Chloride: 103 mEq/L (ref 96–112)
GFR: 77.7 mL/min (ref >60.00)
Glucose, Bld: 61 mg/dL — ABNORMAL LOW (ref 70–99)
Potassium: 4 mEq/L (ref 3.5–5.1)
Sodium: 139 mEq/L (ref 135–145)

## 2017-06-29 ENCOUNTER — Encounter: Payer: Self-pay | Admitting: Medical

## 2017-06-29 ENCOUNTER — Ambulatory Visit: Payer: Medicare Other | Admitting: Medical

## 2017-06-29 ENCOUNTER — Ambulatory Visit: Payer: Self-pay | Admitting: *Deleted

## 2017-06-29 VITALS — BP 142/79 | HR 64 | Temp 97.6°F | Resp 16 | Ht 62.0 in | Wt 149.4 lb

## 2017-06-29 DIAGNOSIS — R319 Hematuria, unspecified: Secondary | ICD-10-CM | POA: Diagnosis not present

## 2017-06-29 DIAGNOSIS — R350 Frequency of micturition: Secondary | ICD-10-CM | POA: Diagnosis not present

## 2017-06-29 DIAGNOSIS — R791 Abnormal coagulation profile: Secondary | ICD-10-CM

## 2017-06-29 LAB — POC URINALSYSI DIPSTICK (AUTOMATED)
Bilirubin, UA: NEGATIVE
Glucose, UA: NEGATIVE
Ketones, UA: NEGATIVE
LEUKOCYTES UA: NEGATIVE
NITRITE UA: NEGATIVE
PH UA: 6 (ref 5.0–8.0)
Protein, UA: NEGATIVE
Spec Grav, UA: 1.015 (ref 1.010–1.025)
Urobilinogen, UA: 1 E.U./dL

## 2017-06-29 MED ORDER — CEPHALEXIN 500 MG PO CAPS: 500.0000 mg | ORAL_CAPSULE | Freq: Two times a day (BID) | ORAL | 0 refills | Status: DC

## 2017-06-29 NOTE — Patient Instructions (Addendum)
You  have had  some frequent urination, hesitant  flow at times, faint suprapubic tenderness and recent blood in your urine.  Concern for UTI.  I am going to prescribe you Keflex antibiotic and send urine culture out.  We need to repeat your urine test in 10 days to make sure no further blood present.  Also please let me know if you see any further gross blood in your urine.  We will let you know urine culture results when those are in.  INR today was 2.5 so that does not appear to be because of blood in urine.   Also keep in mind that if you do have any blood in your urine when we repeat test in 10 days or if you  have any gross blood then I will want to refer you to urologist.  Future urine order placed.  You can go ahead and schedule that today.  Follow-up in 7-10 days or as needed.

## 2017-06-29 NOTE — Telephone Encounter (Signed)
Pt reports blood in urine, onset Saturday night 06/25/17. Multiple clots "dark red" throughout night, "few small ones Sunday." Now with bright red blood, small amount on tissue paper, "water in commode bright red."  Occurs each time pt voids. States "mild feeling of pressure" lower abdominal area, below umbilicus. "Mild" discomfort with urination.  Pt does wear pad with H/O "urine leakage". States spotting in pads. Denies fever, dizziness, flank or lower back pain. Seen by M. O'Sullivan on 06/20/17 F/U fatigue, BLE edema, hypokalemia.  Pt is on Coumadin. Last INR 06/13/17... 3.1. Appt made for today with E. Saguier. Care advice given per protocol. .  Reason for Disposition . Taking Coumadin (warfarin) or other strong blood thinner, or known bleeding disorder (e.g., thrombocytopenia)  Answer Assessment - Initial Assessment Questions 1. COLOR of URINE: "Describe the color of the urine."  (e.g., tea-colored, pink, red, blood clots, bloody)     Dark red clots onset Saturday night, now spotting on tissue 2. ONSET: "When did the bleeding start?"      Saturday night 3. EPISODES: "How many times has there been blood in the urine?" or "How many times today?"     Every time I urinate; Pink on tissue, "commode water bright red." 4. PAIN with URINATION: "Is there any pain with passing your urine?" If so, ask: "How bad is the pain?"  (Scale 1-10; or mild, moderate, severe)    - MILD - complains slightly about urination hurting    - MODERATE - interferes with normal activities      - SEVERE - excruciating, unwilling or unable to urinate because of the pain     Mild pain 5. FEVER: "Do you have a fever?" If so, ask: "What is your temperature, how was it measured, and when did it start?"    No 6. ASSOCIATED SYMPTOMS: "Are you passing urine more frequently than usual?"     Yes but on Lasix 7. OTHER SYMPTOMS: "Do you have any other symptoms?" (e.g., back/flank pain, abdominal pain, vomiting)    Mild pressure lower  abdomin, below umbilicus 8. PREGNANCY: "Is there any chance you are pregnant?" "When was your last menstrual period?"     Years ago  Protocols used: URINE - BLOOD IN-A-AH

## 2017-06-29 NOTE — Progress Notes (Signed)
Subjective:    Patient ID: Marie Malone, female    DOB: 1950-07-21, 67 y.o.   MRN: [id number]  HPI  Pt in for some blood in her urine that she saw on Saturday. She saw some when she urinated. She saw some blood each day up until today. Then none today. Pt has remote hx of smoking more than 30 yrs ago.  Pt is on coumadin. Last inr was 3.1 on June 13, 2017. She held coumadin for one day then resumed her dosage. Cardiologist will recheck in July 11, 2017. INR today was 2.5.   Dysuria- no Frequent urination-yes Hesitancy-yes Suprapubic pressure-faint tender.  Fever-no chills-no Nausea-no  Vomiting-no  CVA pain-no  History of UTI-rare Gross hematuria- some recently since Saturday but none before   Review of Systems  Constitutional: Negative for chills, fatigue and fever.  HENT: Negative for congestion, ear pain and facial swelling.   Respiratory: Negative for cough, chest tightness, shortness of breath and wheezing.   Cardiovascular: Negative for chest pain and palpitations.  Gastrointestinal: Negative for abdominal distention and anal bleeding.  Genitourinary: Positive for frequency and hematuria. Negative for difficulty urinating, dysuria, genital sores, pelvic pain and urgency.       Hesitant urine flow.  Musculoskeletal: Negative for back pain.  Hematological: Negative for adenopathy. Does not bruise/bleed easily.    Past Medical History:  Diagnosis Date  . Atrial fibrillation ([place])   . Chronic diastolic CHF (congestive heart failure) ([place])   . Complication of anesthesia    difficult to wake up from anesthesia  . Coronary artery disease involving native coronary artery without angina pectoris 03/26/2016   100% chronic occlusion of RCA  . Dyspnea   . Heart murmur   . History of rheumatic fever    as a child  . Hyperlipidemia   . Hypertension   . Mitral stenosis   . OSA (obstructive sleep apnea)    mild per sleep study 2008  . Paroxysmal atrial fibrillation  ([facility])   . Pneumonia    double pneumonia once  . Pulmonary hypertension ([place])   . Rheumatic mitral regurgitation   . S/P balloon mitral valvuloplasty 2008   . 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 S/P mitral valve replacement with mechanical valve 05/28/2016   29 mm Sorin Carbomedics Optiform bileaflet mechanical prosthetic valve  . Stroke [facility])    x2     Social History   Socioeconomic History  . Marital status: Married    Spouse name: Not on file  . Number of children: 2  . Years of education: Associates  . Highest education level: Not on file  Social Needs  . Financial resource strain: Not on file  . Food insecurity - worry: Not on file  . Food insecurity - inability: Not on file  . Transportation needs - medical: Not on file  . Transportation needs - non-medical: Not on file  Occupational History  . Not on file  Tobacco Use  . Smoking status: Former Smoker    Packs/day: 1.00    Years: 5.00    Pack years: 5.00    Types: Cigarettes    Start date: 07/15/1970    Last attempt to quit: 05/11/1975    Years since quitting: 42.1  . Smokeless tobacco: Never Used  . Tobacco comment: quit smoking 36 years ago  Substance and Sexual Activity  . Alcohol use: No    Alcohol/week: 0.0 oz  Comment: drank years ago when young  . Drug use: No  . Sexual activity: Not on file  Other Topics Concern  . Not on file  Social History Narrative   Retired Archivist   Married   She has 2 grown children-   Daughter lives in [place]   Son lives in [place]   6 grandchildren     Past Surgical History:  Procedure Laterality Date  . APPENDECTOMY  1989   appendix ruptured,had peritonitis  . BALLOON VALVULOPLASTY  2008   [facility]  . CARDIAC CATHETERIZATION N/A 03/25/2016   Procedure: Right/Left Heart Cath and Coronary Angiography;  Surgeon: Jettie Booze, MD;  Location: [place] CV [facility];  Service: Cardiovascular;  Laterality: N/A;  . COLONOSCOPY  W/ POLYPECTOMY    . CORONARY ARTERY BYPASS GRAFT N/A 05/28/2016   Procedure: CORONARY ARTERY BYPASS GRAFTING (CABG) x 1 WITH ENDOSCOPIC HARVESTING OF RIGHT SAPHENOUS VEIN, EVH- PDA;  Surgeon: Rexene Alberts, MD;  Location: [place];  Service: Open Heart Surgery;  Laterality: N/A;  . ENDARTERECTOMY Right 03/10/2016   Procedure: ENDARTERECTOMY CAROTID RIGHT;  Surgeon: Serafina Mitchell, MD;  Location: [place];  Service: Vascular;  Laterality: Right;  . EP IMPLANTABLE DEVICE N/A 03/11/2016   Procedure: Loop Recorder Removal;  Surgeon: Deboraha Sprang, MD;  Location: [place] CV LAB;  Service: Cardiovascular;  Laterality: N/A;  . 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 LOOP RECORDER IMPLANT N/A 04/10/2013   Procedure: LOOP RECORDER IMPLANT;  Surgeon: Coralyn Mark, MD;  Location: [place] CATH LAB;  Service: Cardiovascular;  Laterality: N/A;  . MAZE N/A 05/28/2016   Procedure: MAZE PROCEDURE AND APPLICATION OF  ATRICLIP LAA PROCLIP II 34 MM;  Surgeon: Rexene Alberts, MD;  Location: [place];  Service: Open Heart Surgery;  Laterality: N/A;  . MITRAL VALVE REPLACEMENT N/A 05/28/2016   Procedure: MITRAL VALVE (MV) REPLACEMENT USING 29 MM CARBOMEDICS OPTIFORM MECHANICAL BILEAFLET PROSTHETIC HEART VALVE;  Surgeon: Rexene Alberts, MD;  Location: [place];  Service: Open Heart Surgery;  Laterality: N/A;  . MULTIPLE EXTRACTIONS WITH ALVEOLOPLASTY N/A 03/30/2016   Procedure: EXTRACTION OF TOOTH #'S 2,5,6,11,15,16,20-30 AND 32 WITH ALVEOLOPLASTY AND BILATERAL MAXILLARY LATERAL EXOSTOSES REDUCTIONS;  Surgeon: Lenn Cal, DDS;  Location: [place];  Service: Oral Surgery;  Laterality: N/A;  . PATCH ANGIOPLASTY Right 03/10/2016   Procedure: PATCH ANGIOPLASTY CAROTID RIGHT USING Rueben Bash BIOLOGIC PATCH;  Surgeon: Serafina Mitchell, MD;  Location: [place];  Service: Vascular;  Laterality: Right;  . TEE WITHOUT CARDIOVERSION N/A 04/10/2013   Procedure: TRANSESOPHAGEAL ECHOCARDIOGRAM  (TEE);  Surgeon: [name], MD;  Location: [facility] ENDOSCOPY;  Service: Cardiovascular;  Laterality: N/A;  . TEE WITHOUT CARDIOVERSION N/A 03/24/2016   Procedure: TRANSESOPHAGEAL ECHOCARDIOGRAM (TEE);  Surgeon: Jerline Pain, MD;  Location: [place];  Service: Cardiovascular;  Laterality: N/A;  . TEE WITHOUT CARDIOVERSION N/A 05/28/2016   Procedure: TRANSESOPHAGEAL ECHOCARDIOGRAM (TEE);  Surgeon: Rexene Alberts, MD;  Location: [place];  Service: Open Heart Surgery;  Laterality: N/A;  . TUBAL LIGATION      Family History  Problem Relation Age of Onset  . Diabetes Mother   . Stroke Mother   . Diabetes Father   . Heart disease Father        CHF  . Cancer Father        kidney  . Diabetes Sister   . Diabetes Brother   .  Hypertension Daughter     Allergies  Allergen Reactions  . No Known Allergies     Current Outpatient Medications on File Prior to Visit  Medication Sig Dispense Refill  . aspirin EC 81 MG EC tablet Take 1 tablet (81 mg total) by mouth daily.    [name] Kitchen atorvastatin (LIPITOR) 80 MG tablet Take 0.5 tablets (40 mg total) by mouth daily at 6 PM. 30 tablet 5  . chlorhexidine (PERIDEX) 0.12 % solution Rinse with 15 mLs bid after breakfast and at bedtime.  Use in a swish and spit manner. 480 mL prn  . docusate sodium (COLACE) 100 MG capsule Take 1 capsule (100 mg total) by mouth 2 (two) times daily. 10 capsule 0  . feeding supplement (BOOST / RESOURCE BREEZE) LIQD Take 1 Container by mouth 2 (two) times daily between meals.    . furosemide (LASIX) 40 MG tablet TAKE 1 TABLET BY MOUTH ONCE DAILY 30 tablet 3  . losartan (COZAAR) 25 MG tablet TAKE 1 TABLET BY MOUTH ONCE DAILY 90 tablet 1  . metoprolol succinate (TOPROL-XL) 50 MG 24 hr tablet TAKE 1 TABLET BY MOUTH ONCE DAILY WITH OR IMMEDIATELY FOLLOWING MEALS 90 tablet 0  . polyethylene glycol (MIRALAX / GLYCOLAX) packet Take 17 g by mouth daily. (Patient taking differently: Take 17 g by mouth daily as needed. ) 14 each 0  .  potassium chloride (K-DUR) 10 MEQ tablet Take 1 tablet (10 mEq total) by mouth daily. 30 tablet 3  . warfarin (COUMADIN) 4 MG tablet Take 1 to 1 and 1/2 tablets daily as directed by coumadin clinic 135 tablet 0   No current facility-administered medications on file prior to visit.     BP (!) 142/79   Pulse 64   Temp 97.6 F (36.4 C) (Oral)   Resp 16   Ht 5\' 2"  (1.575 m)   Wt 149 lb 6.4 oz (67.8 kg)   SpO2 99%   BMI 27.33 kg/m       Objective:   Physical Exam   General Appearance- Not in acute distress.  HEENT Eyes- Scleraeral/Conjuntiva-bilat- Not Yellow. Mouth & Throat- Normal.  Chest and Lung Exam Auscultation: Breath sounds:-Normal. Adventitious sounds:- No Adventitious sounds.  Cardiovascular Auscultation:Rythm - Regular. Heart Sounds -Normal heart sounds.  Abdomen Inspection:-Inspection Normal.  Palpation/Perucssion: Palpation and Percussion of the abdomen reveal- faint suprapubic tender, No Rebound tenderness, No rigidity(Guarding) and No Palpable abdominal masses.  Liver:-Normal.  Spleen:- Normal.   Back- no cva pain.     Assessment & Plan:  You  have had  some frequent urination, hesitant  flow at times, faint suprapubic tenderness and recent blood in your urine.  Concern for UTI.  I am going to prescribe you Keflex antibiotic and send urine culture out.  We need to repeat your urine test in 10 days to make sure no further blood present.  Also please let me know if you see any further gross blood in your urine.  We will let you know urine culture results when those are in.  INR today was 2.5 so that does not appear to be because of blood in urine.   Also keep in mind that if you do have any blood in your urine when we repeat test in 10 days or if he had any gross blood then I will want to refer you to urologist.  Future urine order placed.  You can go ahead and schedule that today.  Follow-up in 7-10 days or as needed.  Mackie Pai, PA-C

## 2017-06-30 LAB — URINE CULTURE
MICRO NUMBER:: 90224608
Result:: NO GROWTH
SPECIMEN QUALITY:: ADEQUATE

## 2017-07-01 ENCOUNTER — Telehealth: Payer: Self-pay | Admitting: Medical

## 2017-07-01 DIAGNOSIS — R319 Hematuria, unspecified: Secondary | ICD-10-CM

## 2017-07-01 NOTE — Telephone Encounter (Signed)
Referral to urologist placed. 

## 2017-07-08 ENCOUNTER — Other Ambulatory Visit (INDEPENDENT_AMBULATORY_CARE_PROVIDER_SITE_OTHER): Payer: Medicare Other

## 2017-07-08 DIAGNOSIS — R319 Hematuria, unspecified: Secondary | ICD-10-CM

## 2017-07-08 LAB — URINALYSIS, ROUTINE W REFLEX MICROSCOPIC
Bilirubin Urine: NEGATIVE
Ketones, ur: NEGATIVE
Nitrite: NEGATIVE
PH: 6 (ref 5.0–8.0)
Specific Gravity, Urine: 1.02 (ref 1.000–1.030)
TOTAL PROTEIN, URINE-UPE24: NEGATIVE
URINE GLUCOSE: NEGATIVE
Urobilinogen, UA: 4 — AB (ref 0.0–1.0)

## 2017-07-11 ENCOUNTER — Ambulatory Visit (INDEPENDENT_AMBULATORY_CARE_PROVIDER_SITE_OTHER): Payer: Medicare Other | Admitting: Pharmacist

## 2017-07-11 ENCOUNTER — Other Ambulatory Visit: Payer: Self-pay | Admitting: Family

## 2017-07-11 DIAGNOSIS — I4891 Unspecified atrial fibrillation: Secondary | ICD-10-CM | POA: Diagnosis not present

## 2017-07-11 DIAGNOSIS — I480 Paroxysmal atrial fibrillation: Secondary | ICD-10-CM

## 2017-07-11 LAB — POCT INR: INR: 2.3

## 2017-07-11 NOTE — Patient Instructions (Signed)
Description   Continue taking 1 tablet daily except for 1.5 every Monday and Friday.  Repeat INR in 4 weeks

## 2017-07-15 ENCOUNTER — Other Ambulatory Visit: Payer: Self-pay

## 2017-07-15 DIAGNOSIS — R7989 Other specified abnormal findings of blood chemistry: Secondary | ICD-10-CM

## 2017-07-15 DIAGNOSIS — R945 Abnormal results of liver function studies: Principal | ICD-10-CM

## 2017-07-15 MED ORDER — ATORVASTATIN CALCIUM 80 MG PO TABS: 40.0000 mg | ORAL_TABLET | Freq: Every day | ORAL | 1 refills | Status: DC

## 2017-07-26 ENCOUNTER — Telehealth: Payer: Self-pay | Admitting: *Deleted

## 2017-07-26 DIAGNOSIS — R7989 Other specified abnormal findings of blood chemistry: Secondary | ICD-10-CM

## 2017-07-26 DIAGNOSIS — R945 Abnormal results of liver function studies: Principal | ICD-10-CM

## 2017-07-26 MED ORDER — ATORVASTATIN CALCIUM 80 MG PO TABS: 40.0000 mg | ORAL_TABLET | Freq: Every day | ORAL | 1 refills | Status: DC

## 2017-07-26 NOTE — Telephone Encounter (Signed)
Received fax from [facility] requesting atorvastatin 80mg  1 tablet by mouth daily.  Per phone note of 02/25/17, atorvastatin was reduced to 1/2 tablet daily by cardiology. Refill sent for 1/2 tablet.

## 2017-07-27 ENCOUNTER — Emergency Department (HOSPITAL_BASED_OUTPATIENT_CLINIC_OR_DEPARTMENT_OTHER)
Admission: EM | Admit: 2017-07-27 | Discharge: 2017-07-27 | Disposition: A | Discharge status: Home / Self Care | Payer: Medicare Other | Attending: Emergency Medicine | Admitting: Emergency Medicine

## 2017-07-27 ENCOUNTER — Other Ambulatory Visit: Payer: Self-pay

## 2017-07-27 ENCOUNTER — Ambulatory Visit: Payer: Self-pay

## 2017-07-27 ENCOUNTER — Encounter (HOSPITAL_BASED_OUTPATIENT_CLINIC_OR_DEPARTMENT_OTHER): Payer: Self-pay

## 2017-07-27 DIAGNOSIS — I2510 Atherosclerotic heart disease of native coronary artery without angina pectoris: Secondary | ICD-10-CM | POA: Insufficient documentation

## 2017-07-27 DIAGNOSIS — I110 Hypertensive heart disease with heart failure: Secondary | ICD-10-CM | POA: Diagnosis not present

## 2017-07-27 DIAGNOSIS — I5032 Chronic diastolic (congestive) heart failure: Secondary | ICD-10-CM | POA: Insufficient documentation

## 2017-07-27 DIAGNOSIS — Z8673 Personal history of transient ischemic attack (TIA), and cerebral infarction without residual deficits: Secondary | ICD-10-CM | POA: Insufficient documentation

## 2017-07-27 DIAGNOSIS — R040 Epistaxis: Secondary | ICD-10-CM | POA: Diagnosis present

## 2017-07-27 DIAGNOSIS — Z7982 Long term (current) use of aspirin: Secondary | ICD-10-CM | POA: Insufficient documentation

## 2017-07-27 DIAGNOSIS — Z7901 Long term (current) use of anticoagulants: Secondary | ICD-10-CM | POA: Diagnosis not present

## 2017-07-27 DIAGNOSIS — Z951 Presence of aortocoronary bypass graft: Secondary | ICD-10-CM | POA: Diagnosis not present

## 2017-07-27 DIAGNOSIS — Z87891 Personal history of nicotine dependence: Secondary | ICD-10-CM | POA: Diagnosis not present

## 2017-07-27 DIAGNOSIS — Z79899 Other long term (current) drug therapy: Secondary | ICD-10-CM | POA: Insufficient documentation

## 2017-07-27 MED ORDER — OXYMETAZOLINE HCL 0.05 % NA SOLN
NASAL | Status: AC
  Administered 2017-07-27: 14:00:00
  Filled 2017-07-27: qty 15

## 2017-07-27 NOTE — Telephone Encounter (Signed)
Pt. States nose bleed started 0300 this morning on the left side. States it slowed down then "started back up." Pt. Is on Coumadin. Recommended that she be seen in ED for evaluation and treatment for nosebleed. Verbalizes understanding.  Reason for Disposition . [1] Bleeding present > 30 minutes AND [2] using correct method of direct pressure  Answer Assessment - Initial Assessment Questions 1. AMOUNT OF BLEEDING: "How bad is the bleeding?" "How much blood was lost?" "Has the bleeding stopped?"   - MILD: needed a couple tissues   - MODERATE: needed many tissues   - SEVERE: large blood clots, soaked many tissues, lasted more than 30 minutes      Moderate 2. ONSET: "When did the nosebleed start?"      0300 this morning 3. FREQUENCY: "How many nosebleeds have you had in the last 24 hours?"      Only 4. RECURRENT SYMPTOMS: "Have there been other recent nosebleeds?" If so, ask: "How long did it take you to stop the bleeding?" "What worked best?"      Yes - a long time ago 5. CAUSE: "What do you think caused this nosebleed?"     Maybe the heat in the house 6. LOCAL FACTORS: "Do you have any cold symptoms?", "Have you been rubbing or picking at your nose?"     No 7. SYSTEMIC FACTORS: "Do you have high blood pressure or any bleeding problems?"     HTN 8. BLOOD THINNERS: "Do you take any blood thinners?" (e.g., coumadin, heparin, aspirin, Plavix)     Coumadin 9. OTHER SYMPTOMS: "Do you have any other symptoms?" (e.g., lightheadedness)     No 10. PREGNANCY: "Is there any chance you are pregnant?" "When was your last menstrual period?"       No  Protocols used: NOSEBLEED-A-AH

## 2017-07-27 NOTE — ED Provider Notes (Signed)
[facility] EMERGENCY DEPARTMENT Provider Note   CSN: [id number] Arrival date & time: 07/27/17  1134     History   Chief Complaint Chief Complaint  Patient presents with  . Epistaxis    HPI Marie Malone is a 67 y.o. female.  HPI 67 year old 61 female past medical history significant for-year-old fibrillation currently on Coumadin, hypertension, CHF and CAD presents to the emergency department today for evaluation of nosebleed.  Patient states that she woke up this morning approximately 3 AM with a dry throat.  States she went to get some water and noticed that her left nostril was oozing blood.  Patient states that he has a slight bleed from the left nostril since then.  Denies any associated pain.  Denies any other bleeding noted.  Patient has not tried any over-the-counter medications for her symptoms.  She has been applying pressure with some relief.  Does state that her nose has been dry recently and having an upper respiratory tract infection with blowing her nose.  Has any associated lightheadedness, dizziness, palpitations.  Patient's INR was 2.3 last week. Past Medical History:  Diagnosis Date  . Atrial fibrillation ([place])   . Chronic diastolic CHF (congestive heart failure) ([place])   . Complication of anesthesia    difficult to wake up from anesthesia  . Coronary artery disease involving native coronary artery without angina pectoris 03/26/2016   100% chronic occlusion of RCA  . Dyspnea   . Heart murmur   . History of rheumatic fever    as a child  . Hyperlipidemia   . Hypertension   . Mitral stenosis   . OSA (obstructive sleep apnea)    mild per sleep study 2008  . Paroxysmal atrial fibrillation ([facility])   . Pneumonia    double pneumonia once  . Pulmonary hypertension ([place])   . Rheumatic mitral regurgitation   . S/P balloon mitral valvuloplasty 2008   . 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 S/P mitral valve  replacement with mechanical valve 05/28/2016   29 mm Sorin Carbomedics Optiform bileaflet mechanical prosthetic valve  . Stroke [facility])    x2    Patient Active Problem List   Diagnosis Date Noted  . Hypercholesterolemia 06/25/2016  . Postoperative anemia due to acute blood loss 06/08/2016  . S/P mitral valve replacement with mechanical valve + CABG x1 + Maze procedure 05/28/2016  . S/P CABG x 1 05/28/2016  . S/P Maze operation for atrial fibrillation 05/28/2016  . Chronic diastolic CHF (congestive heart failure) ([place])   . Coronary artery disease involving native coronary artery without angina pectoris 03/26/2016  . Second degree Mobitz I AV block 03/25/2016  . Tongue deviation   . Asymptomatic stenosis of right carotid artery s/p right CEA 03/10/16 03/10/2016  . Cerebral infarction due to embolism of left middle cerebral artery ([place]) 04/09/2014  . Valvular vegetation 04/09/2014  . Encounter for therapeutic drug monitoring 06/04/2013  . Paroxysmal atrial fibrillation ([place]) 04/19/2013  . Cardiac device in situ 04/19/2013  . Endocarditis 04/13/2013  . History of CVA (cerebrovascular accident) 04/12/2013  . Pulmonary hypertension ([place]) 04/06/2013  . S/P balloon mitral valvuloplasty 2008   . Essential hypertension 08/30/2012  . History of rheumatic fever 08/30/2012    Past Surgical History:  Procedure Laterality Date  . APPENDECTOMY  1989   appendix ruptured,had peritonitis  . BALLOON VALVULOPLASTY  2008   [facility]  . CARDIAC CATHETERIZATION N/A 03/25/2016  Procedure: Right/Left Heart Cath and Coronary Angiography;  Surgeon: Jettie Booze, MD;  Location: [place] CV LAB;  Service: Cardiovascular;  Laterality: N/A;  . COLONOSCOPY W/ POLYPECTOMY    . CORONARY ARTERY BYPASS GRAFT N/A 05/28/2016   Procedure: CORONARY ARTERY BYPASS GRAFTING (CABG) x 1 WITH ENDOSCOPIC HARVESTING OF RIGHT SAPHENOUS VEIN, EVH- PDA;  Surgeon: Rexene Alberts, MD;  Location: [place];  Service: Open Heart  Surgery;  Laterality: N/A;  . ENDARTERECTOMY Right 03/10/2016   Procedure: ENDARTERECTOMY CAROTID RIGHT;  Surgeon: Serafina Mitchell, MD;  Location: [place];  Service: Vascular;  Laterality: Right;  . EP IMPLANTABLE DEVICE N/A 03/11/2016   Procedure: Loop Recorder Removal;  Surgeon: Deboraha Sprang, MD;  Location: [place] CV LAB;  Service: Cardiovascular;  Laterality: N/A;  . 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 LOOP RECORDER IMPLANT N/A 04/10/2013   Procedure: LOOP RECORDER IMPLANT;  Surgeon: Coralyn Mark, MD;  Location: [place] CATH LAB;  Service: Cardiovascular;  Laterality: N/A;  . MAZE N/A 05/28/2016   Procedure: MAZE PROCEDURE AND APPLICATION OF  ATRICLIP LAA PROCLIP II 38 MM;  Surgeon: Rexene Alberts, MD;  Location: [place];  Service: Open Heart Surgery;  Laterality: N/A;  . MITRAL VALVE REPLACEMENT N/A 05/28/2016   Procedure: MITRAL VALVE (MV) REPLACEMENT USING 29 MM CARBOMEDICS OPTIFORM MECHANICAL BILEAFLET PROSTHETIC HEART VALVE;  Surgeon: Rexene Alberts, MD;  Location: [place];  Service: Open Heart Surgery;  Laterality: N/A;  . MULTIPLE EXTRACTIONS WITH ALVEOLOPLASTY N/A 03/30/2016   Procedure: EXTRACTION OF TOOTH #'S 2,5,6,11,15,16,20-30 AND 32 WITH ALVEOLOPLASTY AND BILATERAL MAXILLARY LATERAL EXOSTOSES REDUCTIONS;  Surgeon: Lenn Cal, DDS;  Location: [place];  Service: Oral Surgery;  Laterality: N/A;  . PATCH ANGIOPLASTY Right 03/10/2016   Procedure: PATCH ANGIOPLASTY CAROTID RIGHT USING Rueben Bash BIOLOGIC PATCH;  Surgeon: Serafina Mitchell, MD;  Location: [place];  Service: Vascular;  Laterality: Right;  . TEE WITHOUT CARDIOVERSION N/A 04/10/2013   Procedure: TRANSESOPHAGEAL ECHOCARDIOGRAM (TEE);  Surgeon: [name], MD;  Location: [facility] ENDOSCOPY;  Service: Cardiovascular;  Laterality: N/A;  . TEE WITHOUT CARDIOVERSION N/A 03/24/2016   Procedure: TRANSESOPHAGEAL ECHOCARDIOGRAM (TEE);  Surgeon: Jerline Pain, MD;  Location: [place];  Service: Cardiovascular;  Laterality: N/A;  . TEE WITHOUT CARDIOVERSION N/A 05/28/2016   Procedure: TRANSESOPHAGEAL ECHOCARDIOGRAM (TEE);  Surgeon: Rexene Alberts, MD;  Location: [place];  Service: Open Heart Surgery;  Laterality: N/A;  . TUBAL LIGATION      OB History    No data available       Home Medications    Prior to Admission medications   Medication Sig Start Date End Date Taking? Authorizing Provider  aspirin EC 81 MG EC tablet Take 1 tablet (81 mg total) by mouth daily. 06/05/16   Gold, Wilder Glade, PA-C  atorvastatin (LIPITOR) 80 MG tablet Take 0.5 tablets (40 mg total) by mouth daily at 6 PM. 07/26/17   Debbrah Alar, NP  cephALEXin (KEFLEX) 500 MG capsule Take 1 capsule (500 mg total) by mouth 2 (two) times daily. 06/29/17   Saguier, Percell Miller, PA-C  chlorhexidine (PERIDEX) 0.12 % solution Rinse with 15 mLs bid after breakfast and at bedtime.  Use in a swish and spit manner. 04/12/16   Lenn Cal, DDS  docusate sodium (COLACE) 100 MG capsule Take 1 capsule (100 mg total) by mouth 2 (two) times daily. 03/20/16   Dessa Phi, DO  feeding supplement (BOOST / RESOURCE BREEZE) LIQD Take 1 Container by mouth 2 (two) times daily between meals.    [provider]  furosemide (LASIX) 40 MG tablet TAKE 1 TABLET BY MOUTH ONCE DAILY 07/11/17   Debbrah Alar, NP  losartan (COZAAR) 25 MG tablet TAKE 1 TABLET BY MOUTH ONCE DAILY 05/11/17   Almyra Deforest, PA  metoprolol succinate (TOPROL-XL) 50 MG 24 hr tablet TAKE 1 TABLET BY MOUTH ONCE DAILY WITH OR IMMEDIATELY FOLLOWING MEALS 05/11/17   Debbrah Alar, NP  polyethylene glycol (MIRALAX / GLYCOLAX) packet Take 17 g by mouth daily. Patient taking differently: Take 17 g by mouth daily as needed.  03/20/16   Dessa Phi, DO  potassium chloride (K-DUR) 10 MEQ tablet Take 1 tablet (10 mEq total) by mouth daily. 06/15/17   Debbrah Alar, NP  warfarin (COUMADIN) 4 MG tablet Take 1 to 1 and 1/2 tablets daily as  directed by coumadin clinic 06/20/17   [name], MD    Family History Family History  Problem Relation Age of Onset  . Diabetes Mother   . Stroke Mother   . Diabetes Father   . Heart disease Father        CHF  . Cancer Father        kidney  . Diabetes Sister   . Diabetes Brother   . Hypertension Daughter     Social History Social History   Tobacco Use  . Smoking status: Former Smoker    Packs/day: 1.00    Years: 5.00    Pack years: 5.00    Types: Cigarettes    Start date: 07/15/1970    Last attempt to quit: 05/11/1975    Years since quitting: 42.2  . Smokeless tobacco: Never Used  . Tobacco comment: quit smoking 36 years ago  Substance Use Topics  . Alcohol use: No    Alcohol/week: 0.0 oz  . Drug use: No     Allergies   No known allergies   Review of Systems Review of Systems  All other systems reviewed and are negative.    Physical Exam Updated Vital Signs BP (!) 131/52 (BP Location: Left Arm)   Pulse 69   Temp 98 F (36.7 C) (Oral)   Resp 18   Ht 5\' 2"  (1.575 m)   Wt 67.6 kg (149 lb)   SpO2 100%   BMI 27.25 kg/m   Physical Exam  Constitutional: She appears well-developed and well-nourished. No distress.  HENT:  Head: Normocephalic and atraumatic.  Eyes: Right eye exhibits no discharge. Left eye exhibits no discharge. No scleral icterus.  Dried blood noted in the left nare.  Large blood clot noted.  No active bleeding at this time.  No blood in the posterior oropharynx.  Septum is midline.  No pain with palpation of the bridge of the nose.  No noted in the right nare.  Neck: Normal range of motion.  Pulmonary/Chest: No respiratory distress.  Musculoskeletal: Normal range of motion.  Neurological: She is alert.  Skin: Skin is warm and dry. Capillary refill takes less than 2 seconds. No pallor.  Psychiatric: Her behavior is normal. Judgment and thought content normal.  Nursing note and vitals reviewed.    ED Treatments / Results   Labs (all labs ordered are listed, but only abnormal results are displayed) Labs Reviewed - No data to display  EKG  EKG Interpretation None       Radiology No results found.  Procedures Procedures (including critical care  time)  Medications Ordered in ED Medications  oxymetazoline (AFRIN) 0.05 % nasal spray (not administered)     Initial Impression / Assessment and Plan / ED Course  I have reviewed the triage vital signs and the nursing notes.  Pertinent labs & imaging results that were available during my care of the patient were reviewed by me and considered in my medical decision making (see chart for details).     Patient presents to the ED with complaints of epistaxis to the left nare.  Patient on Coumadin for atrial fibrillation.  Patient was given Afrin with pressure application for 15 minutes.  On repeat assessment large clot was removed from her nose.  Patient was redosed with Afrin and pressure.  On repeat examination there is no active bleeding.  There is no blood in the oropharynx.  Patient has no signs of significant blood loss.  Discussed ENT follow-up with patient and return precautions.  Patient verbalized understanding of plan of care.  Pt is hemodynamically stable, in NAD, & able to ambulate in the ED. Evaluation does not show pathology that would require ongoing emergent intervention or inpatient treatment. I explained the diagnosis to the patient. Pain has been managed & has no complaints prior to dc. Pt is comfortable with above plan and is stable for discharge at this time. All questions were answered prior to disposition. Strict return precautions for f/u to the ED were discussed. Encouraged follow up with PCP.  Patient seen by my attending who is agreeable the above plan.  Final Clinical Impressions(s) / ED Diagnoses   Final diagnoses:  Epistaxis    ED Discharge Orders    None       Aaron Edelman 07/27/17 1327    Fredia Sorrow, MD 07/29/17 1521

## 2017-07-27 NOTE — ED Triage Notes (Signed)
C/o nosebleed started 3am-denies injury-slight ooze at this time-pt wiping with tissue-NAD-steady gait

## 2017-07-27 NOTE — ED Provider Notes (Signed)
Medical screening examination/treatment/procedure(s) were conducted as a shared visit with non-physician practitioner(s) and myself.  I personally evaluated the patient during the encounter.   EKG Interpretation None       Patient seen by me along with the physician assistant.  Patient with onset of left nosebleed around 3 in the morning.  Now under control with pinching and Afrin.  No active bleeding currently.  Patient stable for discharge home.  Patient is on Coumadin.  Patient's INR level on March 4 was in therapeutic range.  Follow-up with ear nose and throat recommended.  Patient has instructions on what to return for.   Results for orders placed or performed in visit on 07/11/17  POCT INR  Result Value Ref Range   INR 2.3    No results found.    Fredia Sorrow, MD 07/27/17 1318

## 2017-07-27 NOTE — Discharge Instructions (Signed)
You have been treated for nosebleed in the ED.  Have given you the Afrin.  If you develop any further nosebleeds try 1 spray of Afrin and hold her nose for 15 minutes.  If the bleeding persist return to the ED.  Follow-up with your ENT doctor and her primary care doctor.

## 2017-07-28 NOTE — Telephone Encounter (Signed)
Noted  

## 2017-08-07 ENCOUNTER — Other Ambulatory Visit: Payer: Self-pay | Admitting: Family

## 2017-08-08 ENCOUNTER — Ambulatory Visit (INDEPENDENT_AMBULATORY_CARE_PROVIDER_SITE_OTHER): Payer: Medicare Other | Admitting: Pharmacist

## 2017-08-08 DIAGNOSIS — I480 Paroxysmal atrial fibrillation: Secondary | ICD-10-CM

## 2017-08-08 DIAGNOSIS — I4891 Unspecified atrial fibrillation: Secondary | ICD-10-CM

## 2017-08-08 DIAGNOSIS — Z7901 Long term (current) use of anticoagulants: Secondary | ICD-10-CM | POA: Diagnosis not present

## 2017-08-08 LAB — POCT INR: INR: 3

## 2017-08-11 ENCOUNTER — Telehealth: Payer: Self-pay | Admitting: Family

## 2017-08-11 DIAGNOSIS — R0989 Other specified symptoms and signs involving the circulatory and respiratory systems: Secondary | ICD-10-CM

## 2017-08-11 NOTE — Telephone Encounter (Signed)
Noted. Let's send the patient for an ABI.  Order placed.

## 2017-08-11 NOTE — Telephone Encounter (Signed)
Copied from [place]. Topic: Inquiry >> Aug 11, 2017  2:59 PM Scherrie Gerlach wrote: Reason for CRM: Arville Lime a Nurse practioner with [facility] did [place] in the home today. Calling to advise dr she did the quantoflow test for arterial disease.  This pt came up with severe decreased circulation in both lower extremities.  Right 0.27  left 0.15  If less than .30 they are to ,make the dr aware

## 2017-08-15 NOTE — Telephone Encounter (Signed)
Notified pt and she is scheduled on 08/19/17.

## 2017-08-19 ENCOUNTER — Ambulatory Visit (HOSPITAL_COMMUNITY): Admission: RE | Admit: 2017-08-19 | Payer: Medicare Other | Source: Ambulatory Visit

## 2017-09-02 ENCOUNTER — Telehealth: Payer: Self-pay | Admitting: Family

## 2017-09-02 DIAGNOSIS — R188 Other ascites: Principal | ICD-10-CM

## 2017-09-02 DIAGNOSIS — K7460 Unspecified cirrhosis of liver: Secondary | ICD-10-CM

## 2017-09-02 NOTE — Telephone Encounter (Signed)
Please contact patient and let her know that I reviewed her CT scan from urology.  Note was made on the CT scan of some changes in her liver.  I would like for her to see gastroenterology for further evaluation.  I have pended the referral.

## 2017-09-05 ENCOUNTER — Encounter: Payer: Self-pay | Admitting: Internal Medicine

## 2017-09-05 ENCOUNTER — Ambulatory Visit (INDEPENDENT_AMBULATORY_CARE_PROVIDER_SITE_OTHER): Payer: Medicare Other | Admitting: Pharmacist

## 2017-09-05 DIAGNOSIS — I480 Paroxysmal atrial fibrillation: Secondary | ICD-10-CM

## 2017-09-05 DIAGNOSIS — I4891 Unspecified atrial fibrillation: Secondary | ICD-10-CM

## 2017-09-05 LAB — POCT INR: INR: 2.1

## 2017-09-05 NOTE — Telephone Encounter (Signed)
Left detailed message on voicemail and to call if not contacted about referral within 1 week or if any questions.

## 2017-09-07 ENCOUNTER — Telehealth: Payer: Self-pay | Admitting: Family

## 2017-09-07 NOTE — Telephone Encounter (Signed)
Please contact pt and remind her about her upcoming ultrasound appointment for the circulation in her legs. The home assessment I received from her insurance showed diminished blood flow so it important that she complete formal test.

## 2017-09-09 NOTE — Telephone Encounter (Signed)
Notified pt and she voices understanding. Pt states she was unable to keep previous appt because she was given the wrong location for testing and did not have transportation to the location where test was actually scheduled.

## 2017-09-13 ENCOUNTER — Ambulatory Visit (HOSPITAL_BASED_OUTPATIENT_CLINIC_OR_DEPARTMENT_OTHER)
Admission: RE | Admit: 2017-09-13 | Discharge: 2017-09-13 | Disposition: A | Discharge status: Home / Self Care | Payer: Medicare Other | Source: Ambulatory Visit | Attending: Family | Admitting: Family

## 2017-09-13 DIAGNOSIS — Z8673 Personal history of transient ischemic attack (TIA), and cerebral infarction without residual deficits: Secondary | ICD-10-CM | POA: Insufficient documentation

## 2017-09-13 DIAGNOSIS — I10 Essential (primary) hypertension: Secondary | ICD-10-CM | POA: Diagnosis not present

## 2017-09-13 DIAGNOSIS — E785 Hyperlipidemia, unspecified: Secondary | ICD-10-CM | POA: Insufficient documentation

## 2017-09-13 DIAGNOSIS — Z951 Presence of aortocoronary bypass graft: Secondary | ICD-10-CM | POA: Insufficient documentation

## 2017-09-13 DIAGNOSIS — R9389 Abnormal findings on diagnostic imaging of other specified body structures: Secondary | ICD-10-CM | POA: Diagnosis not present

## 2017-09-13 DIAGNOSIS — R0989 Other specified symptoms and signs involving the circulatory and respiratory systems: Secondary | ICD-10-CM

## 2017-09-13 DIAGNOSIS — I4430 Unspecified atrioventricular block: Secondary | ICD-10-CM | POA: Insufficient documentation

## 2017-09-13 NOTE — Progress Notes (Signed)
   ABI was performed ReelAlyson Locket 09/13/2017, 12:56 PM

## 2017-10-10 ENCOUNTER — Ambulatory Visit (INDEPENDENT_AMBULATORY_CARE_PROVIDER_SITE_OTHER): Payer: Medicare Other | Admitting: Pharmacist

## 2017-10-10 DIAGNOSIS — I480 Paroxysmal atrial fibrillation: Secondary | ICD-10-CM

## 2017-10-10 DIAGNOSIS — I4891 Unspecified atrial fibrillation: Secondary | ICD-10-CM | POA: Diagnosis not present

## 2017-10-10 DIAGNOSIS — Z7901 Long term (current) use of anticoagulants: Secondary | ICD-10-CM | POA: Diagnosis not present

## 2017-10-10 LAB — POCT INR: INR: 2.7 (ref 2.0–3.0)

## 2017-10-14 ENCOUNTER — Other Ambulatory Visit: Payer: Self-pay | Admitting: *Deleted

## 2017-10-14 DIAGNOSIS — I6523 Occlusion and stenosis of bilateral carotid arteries: Secondary | ICD-10-CM

## 2017-10-14 DIAGNOSIS — I679 Cerebrovascular disease, unspecified: Secondary | ICD-10-CM

## 2017-10-19 ENCOUNTER — Other Ambulatory Visit (INDEPENDENT_AMBULATORY_CARE_PROVIDER_SITE_OTHER): Payer: Medicare Other

## 2017-10-19 ENCOUNTER — Ambulatory Visit: Payer: Medicare Other | Admitting: Internal Medicine

## 2017-10-19 ENCOUNTER — Encounter: Payer: Self-pay | Admitting: Internal Medicine

## 2017-10-19 VITALS — BP 120/70 | HR 66 | Ht 62.0 in | Wt 143.0 lb

## 2017-10-19 DIAGNOSIS — K7460 Unspecified cirrhosis of liver: Secondary | ICD-10-CM | POA: Diagnosis not present

## 2017-10-19 DIAGNOSIS — R945 Abnormal results of liver function studies: Secondary | ICD-10-CM

## 2017-10-19 DIAGNOSIS — R7989 Other specified abnormal findings of blood chemistry: Secondary | ICD-10-CM

## 2017-10-19 LAB — FERRITIN: FERRITIN: 306.8 ng/mL — AB (ref 10.0–291.0)

## 2017-10-19 LAB — IBC PANEL
IRON: 63 ug/dL (ref 42–145)
Saturation Ratios: 17.2 % — ABNORMAL LOW (ref 20.0–50.0)
TRANSFERRIN: 262 mg/dL (ref 212.0–360.0)

## 2017-10-19 LAB — IRON: IRON: 63 ug/dL (ref 42–145)

## 2017-10-19 NOTE — Patient Instructions (Signed)
Your provider has requested that you go to the basement level for lab work before leaving today. Press "B" on the elevator. The lab is located at the first door on the left as you exit the elevator.  

## 2017-10-19 NOTE — Progress Notes (Signed)
HISTORY OF PRESENT ILLNESS:  Marie Malone is a 67 y.o. female , former Probation officer at [employer] a history of coronary artery disease, prior CABG, rheumatic heart disease status post mechanical mitral valve replacement on chronic Coumadin, atrial fibrillation, hypertension, hyperlipidemia, peripheral vascular disease, answering vascular disease with prior stroke who sent today by her primary care provider Ms. Inda Castle regarding possible hepatic cirrhosis as identified on recent imaging. The patient denies a personal or family history of liver disease. Review of previous imaging is as follows: CT scan of the abdomen and pelvis without contrast March 2015 reveals no focal liver abnormality. Normal spleen. Abdominal ultrasound performed July 2018 reveals normal liver and spleen. She is known to have gallstones. For recently, contrast-enhanced CT scan of the abdomen and pelvis elsewhere on 08/09/2017 revealed changes suggesting hepatic cirrhosis (irregularity of the liver). Normal spleen. The patient's INR has been therapeutic. Liver tests from January 2019 revealed elevated AST of 50. Elevated bilirubin 2.3. Normal ALT 24 and alkaline phosphatase at 112. Hepatitis panel (acute) from July 2018 was negative. Negative hepatitis C antibody. The patient does have occasional ankle edema. No swelling otherwise. She denies prior IV drug use or significant alcohol use. She had an overweight for many years. She reports to me that she has had prior colonoscopy in [facility], which was unremarkable.  REVIEW OF SYSTEMS:  All non-GI ROS negative unless otherwise stated in the history of present illness    Past Medical History:  Diagnosis Date  . Atrial fibrillation ([place])   . Chronic diastolic CHF (congestive heart failure) ([place])   . Complication of anesthesia    difficult to wake up from anesthesia  . Coronary artery disease involving native coronary artery without angina pectoris 03/26/2016   100% chronic occlusion of RCA  . Dyspnea   . Heart murmur   . History of rheumatic fever    as a child  . Hyperlipidemia   . Hypertension   . Mitral stenosis   . OSA (obstructive sleep apnea)    mild per sleep study 2008  . Paroxysmal atrial fibrillation (H[facility])   . Pneumonia    double pneumonia once  . Pulmonary hypertension ([place])   . Rheumatic mitral regurgitation   . S/P balloon mitral valvuloplasty 2008   . 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 S/P mitral valve replacement with mechanical valve 05/28/2016   29 mm Sorin Carbomedics Optiform bileaflet mechanical prosthetic valve  . Stroke [facility])    x2    Past Surgical History:  Procedure Laterality Date  . APPENDECTOMY  1989   appendix ruptured,had peritonitis  . BALLOON VALVULOPLASTY  2008   [facility]  . CARDIAC CATHETERIZATION N/A 03/25/2016   Procedure: Right/Left Heart Cath and Coronary Angiography;  Surgeon: Jettie Booze, MD;  Location: [place] CV LAB;  Service: Cardiovascular;  Laterality: N/A;  . COLONOSCOPY W/ POLYPECTOMY    . CORONARY ARTERY BYPASS GRAFT N/A 05/28/2016   Procedure: CORONARY ARTERY BYPASS GRAFTING (CABG) x 1 WITH ENDOSCOPIC HARVESTING OF RIGHT SAPHENOUS VEIN, EVH- PDA;  Surgeon: Rexene Alberts, MD;  Location: [place];  Service: Open Heart Surgery;  Laterality: N/A;  . ENDARTERECTOMY Right 03/10/2016   Procedure: ENDARTERECTOMY CAROTID RIGHT;  Surgeon: Serafina Mitchell, MD;  Location: [place];  Service: Vascular;  Laterality: Right;  . EP IMPLANTABLE DEVICE N/A 03/11/2016   Procedure: Loop Recorder Removal;  Surgeon: Deboraha Sprang, MD;  Location:  [place] INVASIVE CV LAB;  Service: Cardiovascular;  Laterality: N/A;  . 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 LOOP RECORDER IMPLANT N/A 04/10/2013   Procedure: LOOP RECORDER IMPLANT;  Surgeon: Coralyn Mark, MD;  Location: [place] CATH LAB;  Service: Cardiovascular;  Laterality: N/A;  .  MAZE N/A 05/28/2016   Procedure: MAZE PROCEDURE AND APPLICATION OF  ATRICLIP LAA PROCLIP II 90 MM;  Surgeon: Rexene Alberts, MD;  Location: [place];  Service: Open Heart Surgery;  Laterality: N/A;  . MITRAL VALVE REPLACEMENT N/A 05/28/2016   Procedure: MITRAL VALVE (MV) REPLACEMENT USING 29 MM CARBOMEDICS OPTIFORM MECHANICAL BILEAFLET PROSTHETIC HEART VALVE;  Surgeon: Rexene Alberts, MD;  Location: [place];  Service: Open Heart Surgery;  Laterality: N/A;  . MULTIPLE EXTRACTIONS WITH ALVEOLOPLASTY N/A 03/30/2016   Procedure: EXTRACTION OF TOOTH #'S 2,5,6,11,15,16,20-30 AND 32 WITH ALVEOLOPLASTY AND BILATERAL MAXILLARY LATERAL EXOSTOSES REDUCTIONS;  Surgeon: Lenn Cal, DDS;  Location: [place];  Service: Oral Surgery;  Laterality: N/A;  . PATCH ANGIOPLASTY Right 03/10/2016   Procedure: PATCH ANGIOPLASTY CAROTID RIGHT USING Rueben Bash BIOLOGIC PATCH;  Surgeon: Serafina Mitchell, MD;  Location: [place];  Service: Vascular;  Laterality: Right;  . TEE WITHOUT CARDIOVERSION N/A 04/10/2013   Procedure: TRANSESOPHAGEAL ECHOCARDIOGRAM (TEE);  Surgeon: [name], MD;  Location: [facility] ENDOSCOPY;  Service: Cardiovascular;  Laterality: N/A;  . TEE WITHOUT CARDIOVERSION N/A 03/24/2016   Procedure: TRANSESOPHAGEAL ECHOCARDIOGRAM (TEE);  Surgeon: Jerline Pain, MD;  Location: [place];  Service: Cardiovascular;  Laterality: N/A;  . TEE WITHOUT CARDIOVERSION N/A 05/28/2016   Procedure: TRANSESOPHAGEAL ECHOCARDIOGRAM (TEE);  Surgeon: Rexene Alberts, MD;  Location: [place];  Service: Open Heart Surgery;  Laterality: N/A;  . TUBAL LIGATION      Social History Aaren Krog Yebra  reports that she quit smoking about 42 years ago. Her smoking use included cigarettes. She started smoking about 47 years ago. She has a 5.00 pack-year smoking history. She has never used smokeless tobacco. She reports that she does not drink alcohol or use drugs.  family history includes Cancer in her father; Diabetes in her brother, father,  mother, and sister; Heart disease in her father; Hypertension in her daughter; Stroke in her mother.  Allergies  Allergen Reactions  . No Known Allergies        PHYSICAL EXAMINATION: Vital signs: BP 120/70   Pulse 66   Ht 5\' 2"  (1.575 m)   Wt 143 lb (64.9 kg)   BMI 26.16 kg/m   Constitutional: pleasant, chronically ill-appearing, no acute distress Psychiatric: alert and oriented x3, cooperative Eyes: extraocular movements intact, anicteric, conjunctiva pink Mouth: oral pharynx moist, no lesions Neck: supple no lymphadenopathy Cardiovascular: irregular irregular with soft systolic murmur and mechanical heart valve sounds Lungs: clear to auscultation bilaterally Abdomen: soft, nontender, nondistended, no obvious ascites, no peritoneal signs, normal bowel sounds, no organomegaly Rectal:omitted Extremities: no clubbing, cyanosis. 1+ lower extremity edema bilaterally Skin: no lesions on visible extremities Neuro: No focal deficits. No asterixis.    ASSESSMENT:  #1. Possible underlying liver disease with cirrhosis. Supporting features include imaging recently as described, chronic elevation of liver tests, and thrombocytopenia which may indicate portal hypertension. However, no abnormalities on previous imaging and no evidence for portal hypertension on imaging. Possible etiologies include Karlene Lineman, cardiac cirrhosis, or other inheritable or metabolic conditions not yet excluded. She does not appear to have had viral hepatitis. She does not have a  significant history of alcohol use. In any event, there is no evidence for advanced or decompensated liver disease   PLAN:  #1. Multiple viral nonviral studies to assess for causes of liver disease #2. Office follow-up one month to discuss the results as well as impressions and recommendations

## 2017-10-21 LAB — MITOCHONDRIAL ANTIBODIES: MITOCHONDRIAL M2 AB, IGG: 28.5 U — AB

## 2017-10-21 LAB — ALPHA-1-ANTITRYPSIN: A1 ANTITRYPSIN SER: 179 mg/dL (ref 83–199)

## 2017-10-21 LAB — HEPATITIS C ANTIBODY
Hepatitis C Ab: NONREACTIVE
SIGNAL TO CUT-OFF: 0.44 (ref <1.00)

## 2017-10-21 LAB — HEPATITIS B SURFACE ANTIGEN: Hepatitis B Surface Ag: NONREACTIVE

## 2017-10-21 LAB — CERULOPLASMIN: CERULOPLASMIN: 43 mg/dL (ref 18–53)

## 2017-10-21 LAB — ANTI-NUCLEAR AB-TITER (ANA TITER): ANA Titer 1: 1:80 {titer} — ABNORMAL HIGH

## 2017-10-21 LAB — ANA: ANA: POSITIVE — AB

## 2017-10-21 LAB — HEPATITIS B SURFACE ANTIBODY,QUALITATIVE: Hep B S Ab: REACTIVE — AB

## 2017-10-21 LAB — ANTI-SMOOTH MUSCLE ANTIBODY, IGG: ACTIN (SMOOTH MUSCLE) ANTIBODY (IGG): 25 U — AB (ref <20)

## 2017-10-31 NOTE — Progress Notes (Signed)
HPI: FU MVR and atrial fibrillation. Patient has a history of rheumatic fever/MS. Patient had valvuloplasty at [facility] in 2008. Abdominal ultrasound in April of 2014 normal. Prior ILRrevealed paroxysmal atrial fibrillation and anticoagulation was initiated. Patient had carotid endarterectomy November 2017. Admitted with syncope that same month. Had Mobitz type I second-degree AV block when in sinus rhythm but rate controlled when in atrial fibrillation. CTA November 2017 following CEA showed prior right carotid endarterectomy and no hemodynamically significant stenosis of the left carotid. TEE November 2017 showed normal LV function, moderate to severe mitral stenosis, mild left atrial enlargement. Cardiac catheterization November 2017 showed normal LV systolic function, moderate mitral stenosis with mean gradient 10 mmHg, 100% right coronary artery with left-to-right collaterals and moderate pulmonary hypertension. Pt had mechanical MVR, CABG (SVG to PDA) and Maze 1/18. Echocardiogram April 2018 showed ejection fraction 45-50%, mechanical mitral valve, moderate left atrial enlargement, severe right atrial enlargement, mild right ventricular enlargement.   Carotid Dopplers November 2018 showed 1 to 39% bilateral stenosis.  ABIs May 2019 normal but right and left toe brachial index abnormal.  Since last seen,  patient denies dyspnea, chest pain, palpitations or syncope.  No bleeding.  Current Outpatient Medications  Medication Sig Dispense Refill  . aspirin EC 81 MG EC tablet Take 1 tablet (81 mg total) by mouth daily.    [name] Kitchen atorvastatin (LIPITOR) 80 MG tablet Take 0.5 tablets (40 mg total) by mouth daily at 6 PM. 45 tablet 1  . furosemide (LASIX) 40 MG tablet Take 1 tablet (40 mg total) by mouth daily. 90 tablet 1  . losartan (COZAAR) 25 MG tablet Take 1 tablet (25 mg total) by mouth daily. 90 tablet 1  . metoprolol succinate (TOPROL-XL) 50 MG 24 hr tablet TAKE 1 TABLET BY MOUTH ONCE DAILY WITH OR  IMMEDIATELY FOLLOWING MEALS 90 tablet 1  . potassium chloride (K-DUR) 10 MEQ tablet Take 1 tablet (10 mEq total) by mouth daily. 90 tablet 1  . warfarin (COUMADIN) 4 MG tablet Take 1 to 1 and 1/2 tablets daily as directed by coumadin clinic 135 tablet 0   No current facility-administered medications for this visit.      Past Medical History:  Diagnosis Date  . Atrial fibrillation ([place])   . Chronic diastolic CHF (congestive heart failure) ([place])   . Complication of anesthesia    difficult to wake up from anesthesia  . Coronary artery disease involving native coronary artery without angina pectoris 03/26/2016   100% chronic occlusion of RCA  . Dyspnea   . Heart murmur   . History of rheumatic fever    as a child  . Hyperlipidemia   . Hypertension   . Mitral stenosis   . OSA (obstructive sleep apnea)    mild per sleep study 2008  . Paroxysmal atrial fibrillation (H[facility])   . Pneumonia    double pneumonia once  . Pulmonary hypertension ([place])   . Rheumatic mitral regurgitation   . S/P balloon mitral valvuloplasty 2008   . 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 S/P mitral valve replacement with mechanical valve 05/28/2016   29 mm Sorin Carbomedics Optiform bileaflet mechanical prosthetic valve  . Stroke [facility])    x2    Past Surgical History:  Procedure Laterality Date  . APPENDECTOMY  1989   appendix ruptured,had peritonitis  . BALLOON VALVULOPLASTY  2008   [facility]  . CARDIAC CATHETERIZATION N/A 03/25/2016  Procedure: Right/Left Heart Cath and Coronary Angiography;  Surgeon: Jettie Booze, MD;  Location: [place] CV LAB;  Service: Cardiovascular;  Laterality: N/A;  . COLONOSCOPY W/ POLYPECTOMY    . CORONARY ARTERY BYPASS GRAFT N/A 05/28/2016   Procedure: CORONARY ARTERY BYPASS GRAFTING (CABG) x 1 WITH ENDOSCOPIC HARVESTING OF RIGHT SAPHENOUS VEIN, EVH- PDA;  Surgeon: Rexene Alberts, MD;  Location: [place];  Service: Open Heart Surgery;  Laterality:  N/A;  . ENDARTERECTOMY Right 03/10/2016   Procedure: ENDARTERECTOMY CAROTID RIGHT;  Surgeon: Serafina Mitchell, MD;  Location: [place];  Service: Vascular;  Laterality: Right;  . EP IMPLANTABLE DEVICE N/A 03/11/2016   Procedure: Loop Recorder Removal;  Surgeon: Deboraha Sprang, MD;  Location: [place] CV LAB;  Service: Cardiovascular;  Laterality: N/A;  . 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 LOOP RECORDER IMPLANT N/A 04/10/2013   Procedure: LOOP RECORDER IMPLANT;  Surgeon: Coralyn Mark, MD;  Location: [place] CATH LAB;  Service: Cardiovascular;  Laterality: N/A;  . MAZE N/A 05/28/2016   Procedure: MAZE PROCEDURE AND APPLICATION OF  ATRICLIP LAA PROCLIP II 17 MM;  Surgeon: Rexene Alberts, MD;  Location: [place];  Service: Open Heart Surgery;  Laterality: N/A;  . MITRAL VALVE REPLACEMENT N/A 05/28/2016   Procedure: MITRAL VALVE (MV) REPLACEMENT USING 29 MM CARBOMEDICS OPTIFORM MECHANICAL BILEAFLET PROSTHETIC HEART VALVE;  Surgeon: Rexene Alberts, MD;  Location: [place];  Service: Open Heart Surgery;  Laterality: N/A;  . MULTIPLE EXTRACTIONS WITH ALVEOLOPLASTY N/A 03/30/2016   Procedure: EXTRACTION OF TOOTH #'S 2,5,6,11,15,16,20-30 AND 32 WITH ALVEOLOPLASTY AND BILATERAL MAXILLARY LATERAL EXOSTOSES REDUCTIONS;  Surgeon: Lenn Cal, DDS;  Location: [place];  Service: Oral Surgery;  Laterality: N/A;  . PATCH ANGIOPLASTY Right 03/10/2016   Procedure: PATCH ANGIOPLASTY CAROTID RIGHT USING Rueben Bash BIOLOGIC PATCH;  Surgeon: Serafina Mitchell, MD;  Location: [place];  Service: Vascular;  Laterality: Right;  . TEE WITHOUT CARDIOVERSION N/A 04/10/2013   Procedure: TRANSESOPHAGEAL ECHOCARDIOGRAM (TEE);  Surgeon: [name], MD;  Location: [facility] ENDOSCOPY;  Service: Cardiovascular;  Laterality: N/A;  . TEE WITHOUT CARDIOVERSION N/A 03/24/2016   Procedure: TRANSESOPHAGEAL ECHOCARDIOGRAM (TEE);  Surgeon: Jerline Pain, MD;  Location: [place];  Service:  Cardiovascular;  Laterality: N/A;  . TEE WITHOUT CARDIOVERSION N/A 05/28/2016   Procedure: TRANSESOPHAGEAL ECHOCARDIOGRAM (TEE);  Surgeon: Rexene Alberts, MD;  Location: [place];  Service: Open Heart Surgery;  Laterality: N/A;  . TUBAL LIGATION      Social History   Socioeconomic History  . Marital status: Married    Spouse name: Not on file  . Number of children: 2  . Years of education: Associates  . Highest education level: Not on file  Occupational History  . Not on file  Social Needs  . Financial resource strain: Not on file  . Food insecurity:    Worry: Not on file    Inability: Not on file  . Transportation needs:    Medical: Not on file    Non-medical: Not on file  Tobacco Use  . Smoking status: Former Smoker    Packs/day: 1.00    Years: 5.00    Pack years: 5.00    Types: Cigarettes    Start date: 07/15/1970    Last attempt to quit: 05/11/1975    Years since quitting: 42.5  . Smokeless tobacco: Never Used  . Tobacco comment: quit smoking 36 years ago  Substance and Sexual Activity  . Alcohol use: No    Alcohol/week: 0.0 oz  . Drug use: No  . Sexual activity: Not on file  Lifestyle  . Physical activity:    Days per week: Not on file    Minutes per session: Not on file  . Stress: Not on file  Relationships  . Social connections:    Talks on phone: Not on file    Gets together: Not on file    Attends religious service: Not on file    Active member of club or organization: Not on file    Attends meetings of clubs or organizations: Not on file    Relationship status: Not on file  . Intimate partner violence:    Fear of current or ex partner: Not on file    Emotionally abused: Not on file    Physically abused: Not on file    Forced sexual activity: Not on file  Other Topics Concern  . Not on file  Social History Narrative   Retired Archivist   Married   She has 2 grown children-   Daughter lives in [place]   Son lives in [place]   6  grandchildren     Family History  Problem Relation Age of Onset  . Diabetes Mother   . Stroke Mother   . Diabetes Father   . Heart disease Father        CHF  . Cancer Father        kidney  . Diabetes Sister   . Diabetes Brother   . Hypertension Daughter     ROS: no fevers or chills, productive cough, hemoptysis, dysphasia, odynophagia, melena, hematochezia, dysuria, hematuria, rash, seizure activity, orthopnea, PND, pedal edema, claudication. Remaining systems are negative.  Physical Exam: Well-developed well-nourished in no acute distress.  Skin is warm and dry.  HEENT is normal.  Neck is supple.  Chest is clear to auscultation with normal expansion.  Cardiovascular exam is regular rate and rhythm.  Crisp mechanical valve sound Abdominal exam nontender or distended. No masses palpated. Extremities show no edema. neuro grossly intact  A/P  1 status post mitral valve replacement-plan to continue SBE prophylaxis.  Continue Coumadin and aspirin.  2 paroxysmal atrial fibrillation/flutter-we will continue with metoprolol.  Continue Coumadin with goal INR 2.5-3.5 given mitral valve replacement.  3 hypertension-blood pressure is controlled.  Continue present medications.  4 hyperlipidemia-continue statin.  5 carotid artery disease status post carotid endarterectomy-repeat carotid Dopplers November 2019.  6 coronary artery disease-continue statin.  No chest pain.  [name], MD

## 2017-11-02 ENCOUNTER — Other Ambulatory Visit: Payer: Self-pay | Admitting: Family

## 2017-11-02 NOTE — Telephone Encounter (Signed)
30 day supply of Potassium sent to pharmacy. Pt was due for follow up with PCP on 09/17/17. Please call pt to schedule appt with Melissa soon. Thanks!

## 2017-11-02 NOTE — Telephone Encounter (Signed)
Called  Pt and informed her that a 30 day supply was sent in, but she would need to be seen before any further refills could be sent. Pt stated that she is bringing her husband in for an appt on 11/04/17 and she would schedule an appt then.

## 2017-11-07 ENCOUNTER — Ambulatory Visit: Payer: Medicare Other | Admitting: Family

## 2017-11-07 ENCOUNTER — Encounter: Payer: Self-pay | Admitting: Family

## 2017-11-07 ENCOUNTER — Other Ambulatory Visit: Payer: Self-pay | Admitting: Family

## 2017-11-07 VITALS — BP 120/53 | HR 65 | Temp 98.7°F | Resp 16 | Ht 62.0 in | Wt 142.4 lb

## 2017-11-07 DIAGNOSIS — R7989 Other specified abnormal findings of blood chemistry: Secondary | ICD-10-CM

## 2017-11-07 DIAGNOSIS — R310 Gross hematuria: Secondary | ICD-10-CM

## 2017-11-07 DIAGNOSIS — I10 Essential (primary) hypertension: Secondary | ICD-10-CM | POA: Diagnosis not present

## 2017-11-07 DIAGNOSIS — R2231 Localized swelling, mass and lump, right upper limb: Secondary | ICD-10-CM | POA: Diagnosis not present

## 2017-11-07 DIAGNOSIS — R945 Abnormal results of liver function studies: Secondary | ICD-10-CM

## 2017-11-07 LAB — CBC WITH DIFFERENTIAL/PLATELET
Basophils Absolute: 0 10*3/uL (ref 0.0–0.1)
Basophils Relative: 1.6 % (ref 0.0–3.0)
EOS PCT: 2.7 % (ref 0.0–5.0)
Eosinophils Absolute: 0.1 10*3/uL (ref 0.0–0.7)
HCT: 33 % — ABNORMAL LOW (ref 36.0–46.0)
Hemoglobin: 10.6 g/dL — ABNORMAL LOW (ref 12.0–15.0)
Lymphocytes Relative: 25.2 % (ref 12.0–46.0)
Lymphs Abs: 0.6 10*3/uL — ABNORMAL LOW (ref 0.7–4.0)
MCHC: 32 g/dL (ref 30.0–36.0)
MCV: 89 fl (ref 78.0–100.0)
MONO ABS: 0.4 10*3/uL (ref 0.1–1.0)
Monocytes Relative: 15.6 % — ABNORMAL HIGH (ref 3.0–12.0)
NEUTROS ABS: 1.4 10*3/uL (ref 1.4–7.7)
Neutrophils Relative %: 54.9 % (ref 43.0–77.0)
PLATELETS: 88 10*3/uL — AB (ref 150.0–400.0)
RBC: 3.71 Mil/uL — AB (ref 3.87–5.11)
RDW: 17 % — ABNORMAL HIGH (ref 11.5–15.5)
WBC: 2.5 10*3/uL — ABNORMAL LOW (ref 4.0–10.5)

## 2017-11-07 LAB — POC URINALSYSI DIPSTICK (AUTOMATED)
BILIRUBIN UA: NEGATIVE
GLUCOSE UA: NEGATIVE
Ketones, UA: NEGATIVE
Leukocytes, UA: NEGATIVE
NITRITE UA: NEGATIVE
Protein, UA: POSITIVE — AB
RBC UA: NEGATIVE
SPEC GRAV UA: 1.02 (ref 1.010–1.025)
Urobilinogen, UA: 2 E.U./dL — AB
pH, UA: 6 (ref 5.0–8.0)

## 2017-11-07 LAB — COMPREHENSIVE METABOLIC PANEL
ALBUMIN: 3.9 g/dL (ref 3.5–5.2)
ALK PHOS: 136 U/L — AB (ref 39–117)
ALT: 25 U/L (ref 0–35)
AST: 45 U/L — AB (ref 0–37)
BUN: 21 mg/dL (ref 6–23)
CHLORIDE: 104 meq/L (ref 96–112)
CO2: 29 mEq/L (ref 19–32)
CREATININE: 1.02 mg/dL (ref 0.40–1.20)
Calcium: 9 mg/dL (ref 8.4–10.5)
GFR: 69.76 mL/min (ref >60.00)
Glucose, Bld: 72 mg/dL (ref 70–99)
Potassium: 4 mEq/L (ref 3.5–5.1)
Sodium: 139 mEq/L (ref 135–145)
Total Bilirubin: 1.5 mg/dL — ABNORMAL HIGH (ref 0.2–1.2)
Total Protein: 8.3 g/dL (ref 6.0–8.3)

## 2017-11-07 LAB — PROTIME-INR
INR: 3.4 ratio — AB (ref 0.8–1.0)
PROTHROMBIN TIME: 38.6 s — AB (ref 9.6–13.1)

## 2017-11-07 MED ORDER — POTASSIUM CHLORIDE ER 10 MEQ PO TBCR: 10.0000 meq | EXTENDED_RELEASE_TABLET | Freq: Every day | ORAL | 1 refills | Status: DC

## 2017-11-07 MED ORDER — FUROSEMIDE 40 MG PO TABS: 40.0000 mg | ORAL_TABLET | Freq: Every day | ORAL | 1 refills | Status: DC

## 2017-11-07 MED ORDER — LOSARTAN POTASSIUM 25 MG PO TABS: 25.0000 mg | ORAL_TABLET | Freq: Every day | ORAL | 1 refills | Status: DC

## 2017-11-07 MED ORDER — ATORVASTATIN CALCIUM 80 MG PO TABS: 40.0000 mg | ORAL_TABLET | Freq: Every day | ORAL | 1 refills | Status: DC

## 2017-11-07 MED ORDER — METOPROLOL SUCCINATE ER 50 MG PO TB24: ORAL_TABLET | ORAL | 1 refills | Status: DC

## 2017-11-07 MED ORDER — CEPHALEXIN 500 MG PO CAPS: 500.0000 mg | ORAL_CAPSULE | Freq: Two times a day (BID) | ORAL | 0 refills | Status: DC

## 2017-11-07 NOTE — Patient Instructions (Addendum)
Please complete lab work prior to leaving. Call if your urinary symptoms worsen or if they are not improved in 3-4 days. Also, please call if you see recurrent blood in your urine.  You should be contacted about scheduling the ultrasound of your arm.

## 2017-11-07 NOTE — Progress Notes (Signed)
Subjective:    Patient ID: Marie Malone, female    DOB: [birth year], 67 y.o.   MRN: [id number]  HPI  Marie Malone is a 67 yr old female with chief complaint of hematuria and urinary discomfort. Reports that she had one episode of hematuria about 3-4 days ago.  She noted on tissue with wiping.  She reports some associated dysuria.  She denies associated fever or vomiting. She denies nausea today.    She also requests med refills today.   Review of Systems See HPI  Past Medical History:  Diagnosis Date  . Atrial fibrillation ([place])   . Chronic diastolic CHF (congestive heart failure) ([place])   . Complication of anesthesia    difficult to wake up from anesthesia  . Coronary artery disease involving native coronary artery without angina pectoris 03/26/2016   100% chronic occlusion of RCA  . Dyspnea   . Heart murmur   . History of rheumatic fever    as a child  . Hyperlipidemia   . Hypertension   . Mitral stenosis   . OSA (obstructive sleep apnea)    mild per sleep study 2008  . Paroxysmal atrial fibrillation (HCC)   . Pneumonia    double pneumonia once  . Pulmonary hypertension ([place])   . Rheumatic mitral regurgitation   . S/P balloon mitral valvuloplasty 2008   . 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 S/P mitral valve replacement with mechanical valve 05/28/2016   29 mm Sorin Carbomedics Optiform bileaflet mechanical prosthetic valve  . Stroke [facility])    x2     Social History   Socioeconomic History  . Marital status: Married    Spouse name: Not on file  . Number of children: 2  . Years of education: Associates  . Highest education level: Not on file  Occupational History  . Not on file  Social Needs  . Financial resource strain: Not on file  . Food insecurity:    Worry: Not on file    Inability: Not on file  . Transportation needs:    Medical: Not on file    Non-medical: Not on file  Tobacco Use  . Smoking status: Former Smoker   Packs/day: 1.00    Years: 5.00    Pack years: 5.00    Types: Cigarettes    Start date: 07/15/1970    Last attempt to quit: 05/11/1975    Years since quitting: 42.5  . Smokeless tobacco: Never Used  . Tobacco comment: quit smoking 36 years ago  Substance and Sexual Activity  . Alcohol use: No    Alcohol/week: 0.0 oz  . Drug use: No  . Sexual activity: Not on file  Lifestyle  . Physical activity:    Days per week: Not on file    Minutes per session: Not on file  . Stress: Not on file  Relationships  . Social connections:    Talks on phone: Not on file    Gets together: Not on file    Attends religious service: Not on file    Active member of club or organization: Not on file    Attends meetings of clubs or organizations: Not on file    Relationship status: Not on file  . Intimate partner violence:    Fear of current or ex partner: Not on file    Emotionally abused: Not on file    Physically abused: Not on file  Forced sexual activity: Not on file  Other Topics Concern  . Not on file  Social History Narrative   Retired Archivist   Married   She has 2 grown children-   Daughter lives in [place]   Son lives in [place]   6 grandchildren     Past Surgical History:  Procedure Laterality Date  . APPENDECTOMY  1989   appendix ruptured,had peritonitis  . BALLOON VALVULOPLASTY  2008   [facility]  . CARDIAC CATHETERIZATION N/A 03/25/2016   Procedure: Right/Left Heart Cath and Coronary Angiography;  Surgeon: Jettie Booze, MD;  Location: [place] [facility];  Service: Cardiovascular;  Laterality: N/A;  . COLONOSCOPY W/ POLYPECTOMY    . CORONARY ARTERY BYPASS GRAFT N/A 05/28/2016   Procedure: CORONARY ARTERY BYPASS GRAFTING (CABG) x 1 WITH ENDOSCOPIC HARVESTING OF RIGHT SAPHENOUS VEIN, EVH- PDA;  Surgeon: Rexene Alberts, MD;  Location: [place];  Service: Open Heart Surgery;  Laterality: N/A;  . ENDARTERECTOMY Right 03/10/2016   Procedure: ENDARTERECTOMY CAROTID  RIGHT;  Surgeon: Serafina Mitchell, MD;  Location: [place];  Service: Vascular;  Laterality: Right;  . EP IMPLANTABLE DEVICE N/A 03/11/2016   Procedure: Loop Recorder Removal;  Surgeon: Deboraha Sprang, MD;  Location: [place] [facility];  Service: Cardiovascular;  Laterality: N/A;  . 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 LOOP RECORDER IMPLANT N/A 04/10/2013   Procedure: LOOP RECORDER IMPLANT;  Surgeon: Coralyn Mark, MD;  Location: [place] CATH [facility];  Service: Cardiovascular;  Laterality: N/A;  . MAZE N/A 05/28/2016   Procedure: MAZE PROCEDURE AND APPLICATION OF  ATRICLIP LAA PROCLIP II 43 MM;  Surgeon: Rexene Alberts, MD;  Location: [place];  Service: Open Heart Surgery;  Laterality: N/A;  . MITRAL VALVE REPLACEMENT N/A 05/28/2016   Procedure: MITRAL VALVE (MV) REPLACEMENT USING 29 MM CARBOMEDICS OPTIFORM MECHANICAL BILEAFLET PROSTHETIC HEART VALVE;  Surgeon: Rexene Alberts, MD;  Location: [place];  Service: Open Heart Surgery;  Laterality: N/A;  . MULTIPLE EXTRACTIONS WITH ALVEOLOPLASTY N/A 03/30/2016   Procedure: EXTRACTION OF TOOTH #'S 2,5,6,11,15,16,20-30 AND 32 WITH ALVEOLOPLASTY AND BILATERAL MAXILLARY LATERAL EXOSTOSES REDUCTIONS;  Surgeon: Lenn Cal, DDS;  Location: [place];  Service: Oral Surgery;  Laterality: N/A;  . PATCH ANGIOPLASTY Right 03/10/2016   Procedure: PATCH ANGIOPLASTY CAROTID RIGHT USING Rueben Bash BIOLOGIC PATCH;  Surgeon: Serafina Mitchell, MD;  Location: [place];  Service: Vascular;  Laterality: Right;  . TEE WITHOUT CARDIOVERSION N/A 04/10/2013   Procedure: TRANSESOPHAGEAL ECHOCARDIOGRAM (TEE);  Surgeon: [name], MD;  Location: [facility] ENDOSCOPY;  Service: Cardiovascular;  Laterality: N/A;  . TEE WITHOUT CARDIOVERSION N/A 03/24/2016   Procedure: TRANSESOPHAGEAL ECHOCARDIOGRAM (TEE);  Surgeon: Jerline Pain, MD;  Location: [place];  Service: Cardiovascular;  Laterality: N/A;  . TEE WITHOUT CARDIOVERSION N/A 05/28/2016    Procedure: TRANSESOPHAGEAL ECHOCARDIOGRAM (TEE);  Surgeon: Rexene Alberts, MD;  Location: [place];  Service: Open Heart Surgery;  Laterality: N/A;  . TUBAL LIGATION      Family History  Problem Relation Age of Onset  . Diabetes Mother   . Stroke Mother   . Diabetes Father   . Heart disease Father        CHF  . Cancer Father        kidney  . Diabetes Sister   . Diabetes Brother   . Hypertension Daughter     Allergies  Allergen Reactions  .  No Known Allergies     Current Outpatient Medications on File Prior to Visit  Medication Sig Dispense Refill  . aspirin EC 81 MG EC tablet Take 1 tablet (81 mg total) by mouth daily.    [name] Kitchen docusate sodium (COLACE) 100 MG capsule Take 1 capsule (100 mg total) by mouth 2 (two) times daily. (Patient taking differently: Take 100 mg by mouth daily as needed. ) 10 capsule 0  . polyethylene glycol (MIRALAX / GLYCOLAX) packet Take 17 g by mouth daily. (Patient taking differently: Take 17 g by mouth daily as needed. ) 14 each 0  . warfarin (COUMADIN) 4 MG tablet Take 1 to 1 and 1/2 tablets daily as directed by coumadin clinic 135 tablet 0   No current facility-administered medications on file prior to visit.     BP (!) 120/53 (BP Location: Right Arm, Patient Position: Sitting, Cuff Size: Small)   Pulse 65   Temp 98.7 F (37.1 C) (Oral)   Resp 16   Ht 5\' 2"  (1.575 m)   Wt 142 lb 6.4 oz (64.6 kg)   SpO2 100%   BMI 26.05 kg/m       Objective:   Physical Exam  Constitutional: She appears well-developed and well-nourished.  Cardiovascular: Normal rate, regular rhythm and normal heart sounds.  No murmur heard. Pulmonary/Chest: Effort normal and breath sounds normal. No respiratory distress. She has no wheezes.  Abdominal: Soft. She exhibits no distension. There is no tenderness.  Skin: Skin is warm and dry.  + ecchymosis noted RUE, Firm nodular mass noted on RUE overlying right bicep  Psychiatric: She has a normal mood and affect. Her behavior  is normal. Judgment and thought content normal.          Assessment & Plan:  Arm mass- check [place] to further evaluate.  Hematuria- x 1, no microscopic hematuria on UA today.  Will refer check CBC, INR (on coumadin with bruising and hematuria episode- need to rule out supratherapeutic INR).  Send urine for culture and begin empiric keflex while we wait.   HTN- bp stable on current meds. Continue same.

## 2017-11-07 NOTE — Addendum Note (Signed)
Addended by: Debbrah Alar on: 11/07/2017 06:39 PM   Modules accepted: Orders

## 2017-11-08 ENCOUNTER — Telehealth: Payer: Self-pay | Admitting: Family

## 2017-11-08 ENCOUNTER — Encounter: Payer: Self-pay | Admitting: Family

## 2017-11-08 LAB — URINE CULTURE
MICRO NUMBER: 90781098
RESULT: NO GROWTH
SPECIMEN QUALITY: ADEQUATE

## 2017-11-08 NOTE — Telephone Encounter (Signed)
Please let pt know that her coumadin level is high. I would like her to take 1/2 tab of coumadin tonight, then 1 tablet once daily- follow up with coumadin clinic in 1 week. I will ask them to schedule her.  Also, urine culture is negative for infection, she can d/c keflex. Call if she sees any further blood in her urine.

## 2017-11-08 NOTE — Telephone Encounter (Signed)
Spoke with pt she states she will take coumadin as you directed. Urine results and orders were also given.

## 2017-11-09 ENCOUNTER — Ambulatory Visit: Payer: Medicare Other | Admitting: Cardiology

## 2017-11-09 ENCOUNTER — Encounter: Payer: Self-pay | Admitting: Family

## 2017-11-09 ENCOUNTER — Ambulatory Visit (HOSPITAL_BASED_OUTPATIENT_CLINIC_OR_DEPARTMENT_OTHER)
Admission: RE | Admit: 2017-11-09 | Discharge: 2017-11-09 | Disposition: A | Discharge status: Home / Self Care | Payer: Medicare Other | Source: Ambulatory Visit | Attending: Family | Admitting: Family

## 2017-11-09 ENCOUNTER — Encounter: Payer: Self-pay | Admitting: Cardiology

## 2017-11-09 VITALS — BP 116/62 | HR 65 | Ht 62.0 in | Wt 142.4 lb

## 2017-11-09 DIAGNOSIS — E7800 Pure hypercholesterolemia, unspecified: Secondary | ICD-10-CM

## 2017-11-09 DIAGNOSIS — R2231 Localized swelling, mass and lump, right upper limb: Secondary | ICD-10-CM | POA: Diagnosis present

## 2017-11-09 DIAGNOSIS — I10 Essential (primary) hypertension: Secondary | ICD-10-CM | POA: Diagnosis not present

## 2017-11-09 DIAGNOSIS — I679 Cerebrovascular disease, unspecified: Secondary | ICD-10-CM

## 2017-11-09 DIAGNOSIS — I2510 Atherosclerotic heart disease of native coronary artery without angina pectoris: Secondary | ICD-10-CM

## 2017-11-09 DIAGNOSIS — Z954 Presence of other heart-valve replacement: Secondary | ICD-10-CM

## 2017-11-09 NOTE — Patient Instructions (Signed)
Medication Instructions:   NO CHANGE  Testing/Procedures:  Your physician has requested that you have a carotid duplex. This test is an ultrasound of the carotid arteries in your neck. It looks at blood flow through these arteries that supply the brain with blood. Allow one hour for this exam. There are no restrictions or special instructions.  DUE IN NOVEMBER  Follow-Up:Your physician wants you to follow-up in: [place] will receive a reminder letter in the mail two months in advance. If you don't receive a letter, please call our office to schedule the follow-up appointment.   If you need a refill on your cardiac medications before your next appointment, please call your pharmacy.

## 2017-11-09 NOTE — Telephone Encounter (Signed)
Marie Malone, could you please arrange a follow up INR in 1 week? Thank you.

## 2017-11-09 NOTE — Telephone Encounter (Signed)
Talked to the patient today. July 11th in not good for patient but agreed on f/u visit July/12 at 11:45am

## 2017-11-18 ENCOUNTER — Ambulatory Visit (INDEPENDENT_AMBULATORY_CARE_PROVIDER_SITE_OTHER): Payer: Medicare Other | Admitting: Pharmacist

## 2017-11-18 DIAGNOSIS — I4891 Unspecified atrial fibrillation: Secondary | ICD-10-CM | POA: Diagnosis not present

## 2017-11-18 DIAGNOSIS — Z7901 Long term (current) use of anticoagulants: Secondary | ICD-10-CM | POA: Diagnosis not present

## 2017-11-18 DIAGNOSIS — I480 Paroxysmal atrial fibrillation: Secondary | ICD-10-CM

## 2017-11-18 LAB — POCT INR: INR: 2.5 (ref 2.0–3.0)

## 2017-11-23 NOTE — Telephone Encounter (Signed)
Please contact pt re: unread mychart message. 

## 2017-11-23 NOTE — Telephone Encounter (Signed)
Notified pt and she voices understanding. 

## 2017-11-25 ENCOUNTER — Other Ambulatory Visit: Payer: Self-pay | Admitting: Cardiology

## 2017-11-25 DIAGNOSIS — I480 Paroxysmal atrial fibrillation: Secondary | ICD-10-CM

## 2017-11-25 DIAGNOSIS — I4891 Unspecified atrial fibrillation: Secondary | ICD-10-CM

## 2017-12-01 ENCOUNTER — Ambulatory Visit: Payer: Medicare Other | Admitting: Internal Medicine

## 2017-12-01 ENCOUNTER — Encounter: Payer: Self-pay | Admitting: Internal Medicine

## 2017-12-01 VITALS — BP 130/70 | HR 72 | Ht 61.75 in | Wt 142.0 lb

## 2017-12-01 DIAGNOSIS — D61818 Other pancytopenia: Secondary | ICD-10-CM | POA: Diagnosis not present

## 2017-12-01 DIAGNOSIS — K7460 Unspecified cirrhosis of liver: Secondary | ICD-10-CM | POA: Diagnosis not present

## 2017-12-01 DIAGNOSIS — Z7901 Long term (current) use of anticoagulants: Secondary | ICD-10-CM | POA: Diagnosis not present

## 2017-12-01 DIAGNOSIS — K743 Primary biliary cirrhosis: Secondary | ICD-10-CM | POA: Diagnosis not present

## 2017-12-01 MED ORDER — URSODIOL 500 MG PO TABS: 500.0000 mg | ORAL_TABLET | Freq: Two times a day (BID) | ORAL | 11 refills | Status: DC

## 2017-12-01 NOTE — Patient Instructions (Addendum)
We have sent the following medications to your pharmacy for you to pick up at your convenience: Actigall   You have been scheduled for an endoscopy. Please follow written instructions given to you at your visit today. If you use inhalers (even only as needed), please bring them with you on the day of your procedure. Your physician has requested that you go to www.startemmi.com and enter the access code given to you at your visit today. This web site gives a general overview about your procedure. However, you should still follow specific instructions given to you by our office regarding your preparation for the procedure.   Primary Biliary Cirrhosis Primary biliary cirrhosis is a long-term liver disease. Your liver is important for functions such as absorbing nutrients from food, removing waste from the body, and making proteins and substances that help your blood clot. Primary biliary cirrhosis destroys the tubelike structures (bile ducts) inside your liver that produce the digestive fluid called bile. Bile is necessary for absorbing fats, cholesterol, and fat-soluble vitamins. As bile ducts are destroyed, bile backs up in your liver and causes liver damage. It can lead to scarring of the liver (cirrhosis). What are the causes? The exact cause of primary biliary cirrhosis is not known. It may be an autoimmune disease. If you have an autoimmune disease, your body's defense system (immune system) mistakenly attacks normal cells. In primary biliary cirrhosis, the immune system attacks the bile ducts. What increases the risk? You may be at risk for primary biliary cirrhosis if you:  Have a family history of the disease. If you have inherited the genes for the disease, it is likely that something must trigger the genes to become active. Possible triggers include: ? Smoking. ? Exposure to toxic chemicals. ? Infection.  Are a woman.  Are 31-48 years old.  What are the signs or symptoms? In the  early stages of the disease, you might not show any signs or symptoms. The earliest symptoms are:  Fatigue.  Very itchy skin.  Dry eyes or mouth.  Abdominal discomfort under your right rib.  Slight yellow appearance in the whites of your eyes or in your skin (jaundice).  Later signs and symptoms develop as problems related to liver damage occur. These can include:  Swelling of the feet and legs (edema).  Swelling of the belly (ascites).  Mental confusion and impaired mental functioning (hepatic encephalopathy).  Darkening and increased yellowing of the skin.  Fatty bowel movements.  Worsening right-sided belly pain.  Vomiting blood.  How is this diagnosed? Your health care provider may suspect primary biliary cirrhosis if you have abnormal results in a blood test for liver function. This test is often done as part of a routine physical exam. Your health care provider may also check to see if:  Your liver is enlarged.  Your spleen is enlarged.  You have changes in skin color.  You have swelling in your legs or belly.  Tests may also be done to confirm the diagnosis. These can include:  A blood test to look for a specific type of immune system protein (antimitochondrial antibodies or AMA).  Imaging studies of your liver, such as a test that uses sound waves to create pictures of the liver (ultrasound).  A test to remove a piece of tissue from your liver (biopsy) and have it checked under a microscope.  How is this treated? The earlier you start treatment, the more likely it is that primary biliary cirrhosis can be slowed down.The  most important early treatment is a bile substitute medicine (ursodiol) that you will likely need to take for the rest of your life. Also, you should avoid:  Alcohol.  Hepatotoxic medicines. These are medicines that are damaging to the liver when taken too often or in excessive amounts. These can include: ? Medicines. ? Herbal  treatments. ? Vitamins. ? Diet supplements.  As primary biliary cirrhosis progresses, you may need other treatments, such as:  Antihistamines to relieve itching.  Artificial tears and saliva to relieve dry eyes and mouth.  Medicines that lower blood pressure and reduce swelling (diuretics).  A laxative (lactulose) to eliminate toxic substances that cause hepatic encephalopathy.  A liver transplant if primary biliary cirrhosis develops into cirrhosis.  Follow these instructions at home:  Learn as much as you can about your disease, and work closely with your health care providers.  Take medicines only as directed by your health care provider.  Work with a dietitian to come up with a meal plan that gives you the right amount of protein and nutrition with less salt.  Drink enough fluid to keep your urine clear or pale yellow.  Take a daily walk. Ask your health care provider what the right amount of exercise is for you.  Do not use any tobacco products, including cigarettes, chewing tobacco, or electronic cigarettes. If you need help quitting, ask your health care provider.  Do not drink alcohol.  Do not eat raw shellfish. Contact a health care provider if: Your symptoms are gradually getting worse. Get help right away if:  You vomit blood.  Your skin itching suddenly worsens or stops.  You develop new jaundice.  You develop confusion or pass out.  You develop sudden new swelling in your legs or belly. This information is not intended to replace advice given to you by your health care provider. Make sure you discuss any questions you have with your health care provider. Document Released: 03/19/2004 Document Revised: 10/02/2015 Document Reviewed: 08/01/2013 Elsevier Interactive Patient Education  2018 [employer].

## 2017-12-01 NOTE — Progress Notes (Signed)
HISTORY OF PRESENT ILLNESS:  Marie Malone is a 67 y.o. female , former Probation officer at [employer], with multiple medical problems including prior CABG, rheumatic heart disease with mechanical mitral valve on chronic Coumadin, atrial fibrillation, hypertension, hyperlipidemia, and peripheral vascular disease. Prior stroke as well. Evaluated as a new patient to this practice 10/19/2017 regarding possible hepatic cirrhosis on imaging in April 2019 (elsewhere). The patient's laboratories were remarkable for mildly elevated AST, alkaline phosphatase, and only Marie Malone. Also pancytopenia with platelets around 100,000. At time of her last visit we expanded her workup to assess for possible etiologies of chronic liver disease. The workup was negative except for elevated AMA and mildly elevated ANA. There are 2 is elevated. Iron saturation 17%. She did undergo abdominal ultrasound 11/09/2016. The liver appeared normal. No splenomegaly. Gallbladder sludge present. History of gallstones on previous radiology. She does report some pruritus.  REVIEW OF SYSTEMS:  All non-GI ROS negative except for itching  Past Medical History:  Diagnosis Date  . Atrial fibrillation ([place])   . Chronic diastolic CHF (congestive heart failure) ([place])   . Complication of anesthesia    difficult to wake up from anesthesia  . Coronary artery disease involving native coronary artery without angina pectoris 03/26/2016   100% chronic occlusion of RCA  . Dyspnea   . Heart murmur   . History of rheumatic fever    as a child  . Hyperlipidemia   . Hypertension   . Mitral stenosis   . OSA (obstructive sleep apnea)    mild per sleep study 2008  . Paroxysmal atrial fibrillation ([facility])   . Pneumonia    double pneumonia once  . Pulmonary hypertension ([place])   . Rheumatic mitral regurgitation   . S/P balloon mitral valvuloplasty 2008   . 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 S/P mitral valve replacement with  mechanical valve 05/28/2016   29 mm Sorin Carbomedics Optiform bileaflet mechanical prosthetic valve  . Stroke [facility])    x2    Past Surgical History:  Procedure Laterality Date  . APPENDECTOMY  1989   appendix ruptured,had peritonitis  . BALLOON VALVULOPLASTY  2008   [facility]  . CARDIAC CATHETERIZATION N/A 03/25/2016   Procedure: Right/Left Heart Cath and Coronary Angiography;  Surgeon: Jettie Booze, MD;  Location: [place] [facility];  Service: Cardiovascular;  Laterality: N/A;  . COLONOSCOPY W/ POLYPECTOMY    . CORONARY ARTERY BYPASS GRAFT N/A 05/28/2016   Procedure: CORONARY ARTERY BYPASS GRAFTING (CABG) x 1 WITH ENDOSCOPIC HARVESTING OF RIGHT SAPHENOUS VEIN, EVH- PDA;  Surgeon: Rexene Alberts, MD;  Location: [place];  Service: Open Heart Surgery;  Laterality: N/A;  . ENDARTERECTOMY Right 03/10/2016   Procedure: ENDARTERECTOMY CAROTID RIGHT;  Surgeon: Serafina Mitchell, MD;  Location: [place];  Service: Vascular;  Laterality: Right;  . EP IMPLANTABLE DEVICE N/A 03/11/2016   Procedure: Loop Recorder Removal;  Surgeon: Deboraha Sprang, MD;  Location: [place] [facility];  Service: Cardiovascular;  Laterality: N/A;  . 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 LOOP RECORDER IMPLANT N/A 04/10/2013   Procedure: LOOP RECORDER IMPLANT;  Surgeon: Coralyn Mark, MD;  Location: [place] CATH LAB;  Service: Cardiovascular;  Laterality: N/A;  . MAZE N/A 05/28/2016   Procedure: MAZE PROCEDURE AND APPLICATION OF  ATRICLIP LAA PROCLIP II 70 MM;  Surgeon: Rexene Alberts, MD;  Location:  [place] OR;  Service: Open Heart Surgery;  Laterality: N/A;  . MITRAL VALVE REPLACEMENT N/A 05/28/2016   Procedure: MITRAL VALVE (MV) REPLACEMENT USING 29 MM CARBOMEDICS OPTIFORM MECHANICAL BILEAFLET PROSTHETIC HEART VALVE;  Surgeon: Rexene Alberts, MD;  Location: [place];  Service: Open Heart Surgery;  Laterality: N/A;  . MULTIPLE EXTRACTIONS WITH ALVEOLOPLASTY N/A 03/30/2016   Procedure:  EXTRACTION OF TOOTH #'S 2,5,6,11,15,16,20-30 AND 32 WITH ALVEOLOPLASTY AND BILATERAL MAXILLARY LATERAL EXOSTOSES REDUCTIONS;  Surgeon: Lenn Cal, DDS;  Location: [place];  Service: Oral Surgery;  Laterality: N/A;  . PATCH ANGIOPLASTY Right 03/10/2016   Procedure: PATCH ANGIOPLASTY CAROTID RIGHT USING Rueben Bash BIOLOGIC PATCH;  Surgeon: Serafina Mitchell, MD;  Location: [place];  Service: Vascular;  Laterality: Right;  . TEE WITHOUT CARDIOVERSION N/A 04/10/2013   Procedure: TRANSESOPHAGEAL ECHOCARDIOGRAM (TEE);  Surgeon: [name], MD;  Location: [facility] ENDOSCOPY;  Service: Cardiovascular;  Laterality: N/A;  . TEE WITHOUT CARDIOVERSION N/A 03/24/2016   Procedure: TRANSESOPHAGEAL ECHOCARDIOGRAM (TEE);  Surgeon: Jerline Pain, MD;  Location: [place];  Service: Cardiovascular;  Laterality: N/A;  . TEE WITHOUT CARDIOVERSION N/A 05/28/2016   Procedure: TRANSESOPHAGEAL ECHOCARDIOGRAM (TEE);  Surgeon: Rexene Alberts, MD;  Location: [place];  Service: Open Heart Surgery;  Laterality: N/A;  . TUBAL LIGATION      Social History Marie Malone  reports that she quit smoking about 42 years ago. Her smoking use included cigarettes. She started smoking about 47 years ago. She has a 5.00 pack-year smoking history. She has never used smokeless tobacco. She reports that she does not drink alcohol or use drugs.  family history includes Cancer in her father; Diabetes in her brother, father, mother, and sister; Heart disease in her father; Hypertension in her daughter; Stroke in her mother.  Allergies  Allergen Reactions  . No Known Allergies        PHYSICAL EXAMINATION: Vital signs: BP 130/70 (BP Location: Left Arm, Patient Position: Sitting, Cuff Size: Normal)   Pulse 72   Ht 5' 1.75" (1.568 m) Comment: height measured without shoes  Wt 142 lb (64.4 kg)   BMI 26.18 kg/m   Constitutional: generally well-appearing, no acute distress Psychiatric: alert and oriented x3, cooperative Eyes: extraocular  movements intact, anicteric, conjunctiva pink Mouth: oral pharynx moist, no lesions Neck: supple no lymphadenopathy Cardiovascular: mechanical heart sounds Lungs: clear to auscultation bilaterally Abdomen: soft, nontender, nondistended, no obvious ascites, no peritoneal signs, normal bowel sounds, no organomegaly Rectal:omitted Extremities: no lower extremity edema bilaterally Skin: no lesions on visible extremities Neuro: No focal deficits. cranial nerves intact  ASSESSMENT:  #1. Primary biliary cholangitis (PBC). Question portal hypertension with pancytopenia, though no splenomegaly. She does have some pruritus which may be related #2. Multiple significant medical problems. On chronic Coumadin #3. Pancytopenia   PLAN:  #1. Long educational discussion today with the patient and her husband on [place]. We discussed current knowledge base with regards to etiology, treatment, and outcomes. Supplemental literature provided for review #2. Initiate ursodeoxycholic acid 710 mg twice a day (based on weight) #3. Multivitamin with iron #4. Schedule upper endoscopy to rule out esophageal varices. She may stay on Coumadin. She is high-risk given her comorbidities and the need to be on anticoagulation.The nature of the procedure, as well as the risks, benefits, and alternatives were carefully and thoroughly reviewed with the patient. Ample time for discussion and questions allowed. The patient understood, was satisfied, and agreed to proceed. #5. See PCP regarding bone density evaluation if not  done #6. GI follow-up to be arranged after her endoscopy. Likely 3 months #7. Ongoing general medical care with PCP

## 2017-12-16 ENCOUNTER — Ambulatory Visit (INDEPENDENT_AMBULATORY_CARE_PROVIDER_SITE_OTHER): Payer: Medicare Other | Admitting: Pharmacist

## 2017-12-16 DIAGNOSIS — I480 Paroxysmal atrial fibrillation: Secondary | ICD-10-CM | POA: Diagnosis not present

## 2017-12-16 DIAGNOSIS — Z7901 Long term (current) use of anticoagulants: Secondary | ICD-10-CM | POA: Diagnosis not present

## 2017-12-16 DIAGNOSIS — I4891 Unspecified atrial fibrillation: Secondary | ICD-10-CM | POA: Diagnosis not present

## 2017-12-16 LAB — POCT INR: INR: 3.1 — AB (ref 2.0–3.0)

## 2017-12-22 ENCOUNTER — Encounter: Payer: Self-pay | Admitting: Internal Medicine

## 2017-12-29 ENCOUNTER — Encounter: Payer: Self-pay | Admitting: Internal Medicine

## 2017-12-29 ENCOUNTER — Ambulatory Visit (AMBULATORY_SURGERY_CENTER): Payer: Medicare Other | Admitting: Internal Medicine

## 2017-12-29 VITALS — BP 124/61 | HR 86 | Temp 97.8°F | Resp 16 | Ht 61.75 in | Wt 142.0 lb

## 2017-12-29 DIAGNOSIS — K743 Primary biliary cirrhosis: Secondary | ICD-10-CM | POA: Diagnosis present

## 2017-12-29 MED ORDER — SODIUM CHLORIDE 0.9 % IV SOLN: 500.0000 mL | Freq: Once | INTRAVENOUS | Status: DC

## 2017-12-29 NOTE — Progress Notes (Signed)
Initial BP in PACU was low.  IV ephedrine administered in 5-10mg  increments.  Pressure stable before I left PACU

## 2017-12-29 NOTE — Progress Notes (Signed)
History reviewed today 

## 2017-12-29 NOTE — Patient Instructions (Signed)
YOU HAD AN ENDOSCOPIC PROCEDURE TODAY: Refer to the procedure report and other information in the discharge instructions given to you for any specific questions about what was found during the examination. If this information does not answer your questions, please call [facility] office at [phone] to clarify.   YOU SHOULD EXPECT: Some feelings of bloating in the abdomen. Passage of more gas than usual. Walking can help get rid of the air that was put into your GI tract during the procedure and reduce the bloating. If you had a lower endoscopy (such as a colonoscopy or flexible sigmoidoscopy) you may notice spotting of blood in your stool or on the toilet paper. Some abdominal soreness may be present for a day or two, also.  DIET: Your first meal following the procedure should be a light meal and then it is ok to progress to your normal diet. A half-sandwich or bowl of soup is an example of a good first meal. Heavy or fried foods are harder to digest and may make you feel nauseous or bloated. Drink plenty of fluids but you should avoid alcoholic beverages for 24 hours. If you had a esophageal dilation, please see attached instructions for diet.    ACTIVITY: Your care partner should take you home directly after the procedure. You should plan to take it easy, moving slowly for the rest of the day. You can resume normal activity the day after the procedure however YOU SHOULD NOT DRIVE, use power tools, machinery or perform tasks that involve climbing or major physical exertion for 24 hours (because of the sedation medicines used during the test).   SYMPTOMS TO REPORT IMMEDIATELY: A gastroenterologist can be reached at any hour. Please call [phone]  for any of the following symptoms:   Following upper endoscopy (EGD, EUS, ERCP, esophageal dilation) Vomiting of blood or coffee ground material  New, significant abdominal pain  New, significant chest pain or pain under the shoulder blades  Painful or  persistently difficult swallowing  New shortness of breath  Black, tarry-looking or red, bloody stools  FOLLOW UP:  If any biopsies were taken you will be contacted by phone or by letter within the next 1-3 weeks. Call [phone]  if you have not heard about the biopsies in 3 weeks.  Please also call with any specific questions about appointments or follow up tests.  

## 2017-12-29 NOTE — Op Note (Signed)
[facility] Patient Name: Marie Malone Procedure Date: 12/29/2017 9:44 AM MRN: [id number] Endoscopist: Docia Chuck. Henrene Pastor , MD Age: 67 Referring MD:  Date of Birth: 04-May-1951 Gender: Female Account #: [id number] Procedure:                Upper GI endoscopy Indications:              Cirrhosis rule out esophageal varices Medicines:                Monitored Anesthesia Care Procedure:                Pre-Anesthesia Assessment:                           - Prior to the procedure, a History and Physical                            was performed, and patient medications and                            allergies were reviewed. The patient's tolerance of                            previous anesthesia was also reviewed. The risks                            and benefits of the procedure and the sedation                            options and risks were discussed with the patient.                            All questions were answered, and informed consent                            was obtained. Prior Anticoagulants: The patient has                            taken Coumadin (warfarin), last dose was 1 day                            prior to procedure. ASA Grade Assessment: III - A                            patient with severe systemic disease. After                            reviewing the risks and benefits, the patient was                            deemed in satisfactory condition to undergo the                            procedure.  After obtaining informed consent, the endoscope was                            passed under direct vision. Throughout the                            procedure, the patient's blood pressure, pulse, and                            oxygen saturations were monitored continuously. The                            Endoscope was introduced through the mouth, and                            advanced to the second part of duodenum. The upper                         GI endoscopy was accomplished without difficulty.                            The patient tolerated the procedure well. Scope In: Scope Out: Findings:                 The esophagus was normal. No varices.                           The stomach was normal, save mild diffuse erythema                            possibly representing early portal hypertensive                            gastropathy.                           The examined duodenum was normal.                           The cardia and gastric fundus were normal on                            retroflexion. Complications:            No immediate complications. Estimated Blood Loss:     Estimated blood loss: none. Impression:               1. Essentially normal EGD. No varices.                           2. Primary biliary cholangitis Recommendation:           - Patient has a contact number available for                            emergencies. The signs and symptoms of potential  delayed complications were discussed with the                            patient. Return to normal activities tomorrow.                            Written discharge instructions were provided to the                            patient.                           - Resume previous diet.                           - Continue present medications.                           - Office follow-up visit with Dr. Henrene Pastor in 3 months Docia Chuck. Henrene Pastor, MD 12/29/2017 10:01:17 AM This report has been signed electronically.

## 2017-12-30 ENCOUNTER — Telehealth: Payer: Self-pay

## 2017-12-30 NOTE — Telephone Encounter (Signed)
  Follow up Call-  Call back number 12/29/2017  Post procedure Call Back phone  # [phone]  Permission to leave phone message Yes  Some recent data might be hidden     Patient questions:  Do you have a fever, pain , or abdominal swelling? No. Pain Score  0 *  Have you tolerated food without any problems? Yes.    Have you been able to return to your normal activities? Yes.    Do you have any questions about your discharge instructions: Diet   No. Medications  No. Follow up visit  No.  Do you have questions or concerns about your Care? No.  Actions: * If pain score is 4 or above: No action needed, pain <4.

## 2018-01-06 ENCOUNTER — Ambulatory Visit (INDEPENDENT_AMBULATORY_CARE_PROVIDER_SITE_OTHER): Payer: Medicare Other | Admitting: Pharmacist

## 2018-01-06 DIAGNOSIS — Z7901 Long term (current) use of anticoagulants: Secondary | ICD-10-CM | POA: Diagnosis not present

## 2018-01-06 DIAGNOSIS — I4891 Unspecified atrial fibrillation: Secondary | ICD-10-CM

## 2018-01-06 DIAGNOSIS — I480 Paroxysmal atrial fibrillation: Secondary | ICD-10-CM

## 2018-01-06 LAB — POCT INR: INR: 3 (ref 2.0–3.0)

## 2018-02-03 ENCOUNTER — Ambulatory Visit (INDEPENDENT_AMBULATORY_CARE_PROVIDER_SITE_OTHER): Payer: Medicare Other | Admitting: Pharmacist Clinician (PhC)/ Clinical Pharmacy Specialist

## 2018-02-03 DIAGNOSIS — I480 Paroxysmal atrial fibrillation: Secondary | ICD-10-CM | POA: Diagnosis not present

## 2018-02-03 DIAGNOSIS — I4891 Unspecified atrial fibrillation: Secondary | ICD-10-CM

## 2018-02-03 LAB — POCT INR: INR: 3.1 — AB (ref 2.0–3.0)

## 2018-03-03 ENCOUNTER — Ambulatory Visit (INDEPENDENT_AMBULATORY_CARE_PROVIDER_SITE_OTHER): Payer: Medicare Other | Admitting: Pharmacist

## 2018-03-03 DIAGNOSIS — Z7901 Long term (current) use of anticoagulants: Secondary | ICD-10-CM

## 2018-03-03 DIAGNOSIS — I4891 Unspecified atrial fibrillation: Secondary | ICD-10-CM

## 2018-03-03 DIAGNOSIS — I480 Paroxysmal atrial fibrillation: Secondary | ICD-10-CM

## 2018-03-03 LAB — POCT INR: INR: 2.3 (ref 2.0–3.0)

## 2018-04-03 ENCOUNTER — Other Ambulatory Visit: Payer: Self-pay | Admitting: Cardiology

## 2018-04-03 ENCOUNTER — Ambulatory Visit (HOSPITAL_BASED_OUTPATIENT_CLINIC_OR_DEPARTMENT_OTHER)
Admission: RE | Admit: 2018-04-03 | Discharge: 2018-04-03 | Disposition: A | Discharge status: Home / Self Care | Payer: Medicare Other | Source: Ambulatory Visit | Attending: Cardiology | Admitting: Cardiology

## 2018-04-03 DIAGNOSIS — I480 Paroxysmal atrial fibrillation: Secondary | ICD-10-CM

## 2018-04-03 DIAGNOSIS — I679 Cerebrovascular disease, unspecified: Secondary | ICD-10-CM

## 2018-04-03 DIAGNOSIS — I4891 Unspecified atrial fibrillation: Secondary | ICD-10-CM

## 2018-04-03 NOTE — Progress Notes (Signed)
Carotid Ultrasound Duplex Bilateral   Right Carotid:Velocities in the right ICA are consistent with a 1-39% stenosis.  Left Carotid: Velocities in the left ICA are consistent with a 1-39% stenosis.   Vertebrals: Bilateral vertebral arteries demonstrate antegrade flow.  Subclavians:Normal flow hemodynamics were seen in bilateral subclavian arteries.    04/03/18 Cardell Peach [name], RVT

## 2018-04-04 ENCOUNTER — Ambulatory Visit (INDEPENDENT_AMBULATORY_CARE_PROVIDER_SITE_OTHER): Payer: Medicare Other | Admitting: Pharmacist

## 2018-04-04 DIAGNOSIS — I4891 Unspecified atrial fibrillation: Secondary | ICD-10-CM

## 2018-04-04 DIAGNOSIS — I480 Paroxysmal atrial fibrillation: Secondary | ICD-10-CM

## 2018-04-04 LAB — POCT INR: INR: 2.4 (ref 2.0–3.0)

## 2018-05-08 ENCOUNTER — Other Ambulatory Visit: Payer: Self-pay | Admitting: Family

## 2018-05-08 ENCOUNTER — Ambulatory Visit (INDEPENDENT_AMBULATORY_CARE_PROVIDER_SITE_OTHER): Payer: Medicare Other | Admitting: Pharmacist Clinician (PhC)/ Clinical Pharmacy Specialist

## 2018-05-08 DIAGNOSIS — I4891 Unspecified atrial fibrillation: Secondary | ICD-10-CM | POA: Diagnosis not present

## 2018-05-08 DIAGNOSIS — I10 Essential (primary) hypertension: Secondary | ICD-10-CM

## 2018-05-08 DIAGNOSIS — I480 Paroxysmal atrial fibrillation: Secondary | ICD-10-CM

## 2018-05-08 LAB — POCT INR: INR: 3 (ref 2.0–3.0)

## 2018-05-15 ENCOUNTER — Encounter: Payer: Self-pay | Admitting: Family

## 2018-05-15 ENCOUNTER — Ambulatory Visit (INDEPENDENT_AMBULATORY_CARE_PROVIDER_SITE_OTHER): Payer: Medicare Other | Admitting: Family

## 2018-05-15 VITALS — BP 135/67 | HR 74 | Temp 98.2°F | Resp 16 | Ht 61.75 in | Wt 139.0 lb

## 2018-05-15 DIAGNOSIS — Z87448 Personal history of other diseases of urinary system: Secondary | ICD-10-CM | POA: Diagnosis not present

## 2018-05-15 DIAGNOSIS — I10 Essential (primary) hypertension: Secondary | ICD-10-CM

## 2018-05-15 DIAGNOSIS — I480 Paroxysmal atrial fibrillation: Secondary | ICD-10-CM

## 2018-05-15 DIAGNOSIS — L853 Xerosis cutis: Secondary | ICD-10-CM | POA: Diagnosis not present

## 2018-05-15 DIAGNOSIS — Z23 Encounter for immunization: Secondary | ICD-10-CM

## 2018-05-15 NOTE — Patient Instructions (Signed)
Please complete lab work prior to leaving. Apply a good moisturizer to your skin twice daily such as Eucerin ointment.

## 2018-05-15 NOTE — Progress Notes (Signed)
Subjective:    Patient ID: Marie Malone, female    DOB: 02-17-1951, 68 y.o.   MRN: [id number]  HPI  Patietn is a 68 yr old female who presents today for follow up.  HTN- maintained on toprol xl, furosemide.  Denies LE edema.   BP Readings from Last 3 Encounters:  05/15/18 135/67  12/29/17 124/61  12/01/17 130/70   Hyperlipidemia- maintained on atorvastatin 40mg  once daily.   Lab Results  Component Value Date   CHOL 107 03/28/2017   HDL 61 03/28/2017   LDLCALC 35 03/28/2017   TRIG 53 03/28/2017   CHOLHDL 1.8 03/28/2017   PAF/hx MVR/CAD- continues coumadin and aspirin, followed by cardiology.   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   She is concerned about some darkened skin on her feet L>R.     Review of Systems See HPI  Past Medical History:  Diagnosis Date  . Atrial fibrillation ([facility])   . Blood transfusion without reported diagnosis 05/2016  . Chronic diastolic CHF (congestive heart failure) ([place])   . Clotting disorder ([place]) 2014   placed on Warfarin  . Complication of anesthesia    difficult to wake up from anesthesia  . Coronary artery disease involving native coronary artery without angina pectoris 03/26/2016   100% chronic occlusion of RCA  . Dyspnea   . Heart murmur   . History of rheumatic fever    as a child  . Hyperlipidemia   . Hypertension   . Mitral stenosis   . OSA (obstructive sleep apnea)    mild per sleep study 2008  . Paroxysmal atrial fibrillation ([facility])   . Pneumonia    double pneumonia once  . Pulmonary hypertension ([place])   . Rheumatic mitral regurgitation   . S/P balloon mitral valvuloplasty 2008   . 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 S/P mitral valve replacement with mechanical valve 05/28/2016   29 mm Sorin Carbomedics Optiform bileaflet mechanical prosthetic valve  . Stroke [facility])    x2     Social History   Socioeconomic History  . Marital status: Married    Spouse name:  Not on file  . Number of children: 2  . Years of education: Associates  . Highest education level: Not on file  Occupational History  . Not on file  Social Needs  . Financial resource strain: Not on file  . Food insecurity:    Worry: Not on file    Inability: Not on file  . Transportation needs:    Medical: Not on file    Non-medical: Not on file  Tobacco Use  . Smoking status: Former Smoker    Packs/day: 1.00    Years: 5.00    Pack years: 5.00    Types: Cigarettes    Start date: 07/15/1970    Last attempt to quit: 05/11/1975    Years since quitting: 43.0  . Smokeless tobacco: Never Used  . Tobacco comment: quit smoking 36 years ago  Substance and Sexual Activity  . Alcohol use: No    Alcohol/week: 0.0 standard drinks  . Drug use: No  . Sexual activity: Not on file  Lifestyle  . Physical activity:    Days per week: Not on file    Minutes per session: Not on file  . Stress: Not on file  Relationships  . Social connections:    Talks on phone: Not on file    Gets  together: Not on file    Attends religious service: Not on file    Active member of club or organization: Not on file    Attends meetings of clubs or organizations: Not on file    Relationship status: Not on file  . Intimate partner violence:    Fear of current or ex partner: Not on file    Emotionally abused: Not on file    Physically abused: Not on file    Forced sexual activity: Not on file  Other Topics Concern  . Not on file  Social History Narrative   Retired Archivist   Married   She has 2 grown children-   Daughter lives in [place]   Son lives in [place]   6 grandchildren     Past Surgical History:  Procedure Laterality Date  . APPENDECTOMY  1989   appendix ruptured,had peritonitis  . BALLOON VALVULOPLASTY  2008   [facility]  . CARDIAC CATHETERIZATION N/A 03/25/2016   Procedure: Right/Left Heart Cath and Coronary Angiography;  Surgeon: Jettie Booze, MD;  Location: [facility];  Service: Cardiovascular;  Laterality: N/A;  . COLONOSCOPY W/ POLYPECTOMY    . CORONARY ARTERY BYPASS GRAFT N/A 05/28/2016   Procedure: CORONARY ARTERY BYPASS GRAFTING (CABG) x 1 WITH ENDOSCOPIC HARVESTING OF RIGHT SAPHENOUS VEIN, EVH- PDA;  Surgeon: Rexene Alberts, MD;  Location: [facility];  Service: Open Heart Surgery;  Laterality: N/A;  . ENDARTERECTOMY Right 03/10/2016   Procedure: ENDARTERECTOMY CAROTID RIGHT;  Surgeon: Serafina Mitchell, MD;  Location: [facility];  Service: Vascular;  Laterality: Right;  . EP IMPLANTABLE DEVICE N/A 03/11/2016   Procedure: Loop Recorder Removal;  Surgeon: Deboraha Sprang, MD;  Location: [facility] [place] [facility];  Service: Cardiovascular;  Laterality: N/A;  . 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 LOOP RECORDER IMPLANT N/A 04/10/2013   Procedure: LOOP RECORDER IMPLANT;  Surgeon: Coralyn Mark, MD;  Location: [facility];  Service: Cardiovascular;  Laterality: N/A;  . MAZE N/A 05/28/2016   Procedure: MAZE PROCEDURE AND APPLICATION OF  ATRICLIP LAA PROCLIP II 59 MM;  Surgeon: Rexene Alberts, MD;  Location: [facility];  Service: Open Heart Surgery;  Laterality: N/A;  . MITRAL VALVE REPLACEMENT N/A 05/28/2016   Procedure: MITRAL VALVE (MV) REPLACEMENT USING 29 MM CARBOMEDICS OPTIFORM MECHANICAL BILEAFLET PROSTHETIC HEART VALVE;  Surgeon: Rexene Alberts, MD;  Location: [facility];  Service: Open Heart Surgery;  Laterality: N/A;  . MULTIPLE EXTRACTIONS WITH ALVEOLOPLASTY N/A 03/30/2016   Procedure: EXTRACTION OF TOOTH #'S 2,5,6,11,15,16,20-30 AND 32 WITH ALVEOLOPLASTY AND BILATERAL MAXILLARY LATERAL EXOSTOSES REDUCTIONS;  Surgeon: Lenn Cal, DDS;  Location: [facility];  Service: Oral Surgery;  Laterality: N/A;  . PATCH ANGIOPLASTY Right 03/10/2016   Procedure: PATCH ANGIOPLASTY CAROTID RIGHT USING Rueben Bash BIOLOGIC PATCH;  Surgeon: Serafina Mitchell, MD;  Location: [place] [facility];  Service: Vascular;  Laterality: Right;  . TEE  WITHOUT CARDIOVERSION N/A 04/10/2013   Procedure: TRANSESOPHAGEAL ECHOCARDIOGRAM (TEE);  Surgeon: [name], MD;  Location: [facility];  Service: Cardiovascular;  Laterality: N/A;  . TEE WITHOUT CARDIOVERSION N/A 03/24/2016   Procedure: TRANSESOPHAGEAL ECHOCARDIOGRAM (TEE);  Surgeon: Jerline Pain, MD;  Location: [facility];  Service: Cardiovascular;  Laterality: N/A;  . TEE WITHOUT CARDIOVERSION N/A 05/28/2016   Procedure: TRANSESOPHAGEAL ECHOCARDIOGRAM (TEE);  Surgeon: Rexene Alberts, MD;  Location: [facility];  Service: Open Heart Surgery;  Laterality: N/A;  .  TUBAL LIGATION      Family History  Problem Relation Age of Onset  . Diabetes Mother   . Stroke Mother   . Diabetes Father   . Heart disease Father        CHF  . Cancer Father        kidney  . Diabetes Sister   . Diabetes Brother   . Hypertension Daughter   . Colon polyps Neg Hx   . Colon cancer Neg Hx   . Esophageal cancer Neg Hx   . Rectal cancer Neg Hx   . Stomach cancer Neg Hx     Allergies  Allergen Reactions  . No Known Allergies     Current Outpatient Medications on File Prior to Visit  Medication Sig Dispense Refill  . aspirin EC 81 MG EC tablet Take 1 tablet (81 mg total) by mouth daily.    [name] Kitchen atorvastatin (LIPITOR) 80 MG tablet Take 0.5 tablets (40 mg total) by mouth daily at 6 PM. 45 tablet 1  . furosemide (LASIX) 40 MG tablet TAKE 1 TABLET BY MOUTH ONCE DAILY 30 tablet 0  . losartan (COZAAR) 25 MG tablet TAKE 1 TABLET BY MOUTH ONCE DAILY 30 tablet 0  . metoprolol succinate (TOPROL-XL) 50 MG 24 hr tablet TAKE 1 TABLET BY MOUTH ONCE DAILY OR IMMEDIATELY FOLLOWING MEALS 30 tablet 0  . potassium chloride (K-DUR) 10 MEQ tablet Take 1 tablet (10 mEq total) by mouth daily. 90 tablet 1  . ursodiol (ACTIGALL) 500 MG tablet Take 1 tablet (500 mg total) by mouth 2 (two) times daily. 60 tablet 11  . warfarin (COUMADIN) 4 MG tablet TAKE 1 TO 1 & 1/2 (ONE & ONE-HALF) TABLETS BY MOUTH ONCE DAILY AS DIRECTED BY  COUMADIN CLINIC 135 tablet 0   No current facility-administered medications on file prior to visit.     BP 135/67 (BP Location: Right Arm, Patient Position: Sitting, Cuff Size: Small)   Pulse 74   Temp 98.2 F (36.8 C) (Oral)   Resp 16   Ht 5' 1.75" (1.568 m)   Wt 139 lb (63 kg)   SpO2 100%   BMI 25.63 kg/m       Objective:   Physical Exam Constitutional:      Appearance: She is well-developed.  Cardiovascular:     Rate and Rhythm: Normal rate and regular rhythm.     Pulses:          Dorsalis pedis pulses are 2+ on the right side and 2+ on the left side.       Posterior tibial pulses are 2+ on the right side and 2+ on the left side.     Heart sounds: Murmur present.  Pulmonary:     Effort: Pulmonary effort is normal. No respiratory distress.     Breath sounds: Normal breath sounds. No wheezing.  Skin:    General: Skin is warm and dry.     Comments: Dry Hyperpigmented skin noted bilateral dorsal feet L>R  Psychiatric:        Behavior: Behavior normal.        Thought Content: Thought content normal.        Judgment: Judgment normal.           Assessment & Plan:  Dry skin- advised pt on use of a good emmolient such as eucerin ointment.   HTN- BP stable on current meds. Continue same.   Hyperlipidemia- lipids stable, continue statin.  AF- rate stable, followed by cardiology and  coumadin clinic.   Flu shot and prevnar today. Declines shingrix.    Primary biliary cirrhosis- clinically stable- management per GI.

## 2018-05-16 LAB — URINALYSIS, ROUTINE W REFLEX MICROSCOPIC
BILIRUBIN URINE: NEGATIVE
Hgb urine dipstick: NEGATIVE
KETONES UR: NEGATIVE
LEUKOCYTES UA: NEGATIVE
NITRITE: NEGATIVE
PH: 6 (ref 5.0–8.0)
SPECIFIC GRAVITY, URINE: 1.01 (ref 1.000–1.030)
Total Protein, Urine: NEGATIVE
Urine Glucose: NEGATIVE
Urobilinogen, UA: 1 (ref 0.0–1.0)

## 2018-05-16 LAB — COMPREHENSIVE METABOLIC PANEL
ALK PHOS: 88 U/L (ref 39–117)
ALT: 24 U/L (ref 0–35)
AST: 45 U/L — ABNORMAL HIGH (ref 0–37)
Albumin: 3.9 g/dL (ref 3.5–5.2)
BUN: 21 mg/dL (ref 6–23)
CO2: 27 mEq/L (ref 19–32)
Calcium: 9.2 mg/dL (ref 8.4–10.5)
Chloride: 103 mEq/L (ref 96–112)
Creatinine, Ser: 0.89 mg/dL (ref 0.40–1.20)
GFR: 81.51 mL/min (ref >60.00)
Glucose, Bld: 64 mg/dL — ABNORMAL LOW (ref 70–99)
Potassium: 3.7 mEq/L (ref 3.5–5.1)
Sodium: 138 mEq/L (ref 135–145)
TOTAL PROTEIN: 8.9 g/dL — AB (ref 6.0–8.3)
Total Bilirubin: 1.1 mg/dL (ref 0.2–1.2)

## 2018-05-25 ENCOUNTER — Encounter: Payer: Self-pay | Admitting: Family Medicine

## 2018-05-25 ENCOUNTER — Ambulatory Visit (INDEPENDENT_AMBULATORY_CARE_PROVIDER_SITE_OTHER): Payer: Medicare Other | Admitting: Family Medicine

## 2018-05-25 VITALS — BP 132/80 | HR 65 | Temp 97.6°F | Ht 61.5 in | Wt 138.5 lb

## 2018-05-25 DIAGNOSIS — T148XXA Other injury of unspecified body region, initial encounter: Secondary | ICD-10-CM

## 2018-05-25 MED ORDER — SILVER SULFADIAZINE 1 % EX CREA: 1.0000 "application " | TOPICAL_CREAM | Freq: Every day | CUTANEOUS | 0 refills | Status: DC

## 2018-05-25 NOTE — Progress Notes (Signed)
Pre visit review using our clinic review tool, if applicable. No additional management support is needed unless otherwise documented below in the visit note. 

## 2018-05-25 NOTE — Patient Instructions (Addendum)
Apply cream at night. Saran wrap the area after applying cream/lotions to help maintain the barrier and also to help keep you from itching.   When you do wash it, use only soap and water. Do not vigorously scrub. Keep the area clean and dry.   Things to look out for: increasing pain not relieved by ibuprofen/acetaminophen, fevers, spreading redness, drainage of pus, or foul odor.  You can use the Eucerin whenever you feel the skin is dry. There is no medication in it so it will not cause harm.  Let us know if you need anything.

## 2018-05-25 NOTE — Progress Notes (Signed)
Chief Complaint  Patient presents with  . Follow-up    left foot dry skin    Marie Malone is a 68 y.o. female here for a skin complaint.  Duration: 1 week Location: LLE Pruritic? Yes Painful? No Drainage? No New soaps/lotions/topicals/detergents? No Sick contacts? No Other associated symptoms: Cracking of skin led to issue at hand Therapies tried thus far: Eucerin  ROS:  Const: No fevers Skin: As noted in HPI  Past Medical History:  Diagnosis Date  . Atrial fibrillation ([place])   . Blood transfusion without reported diagnosis 05/2016  . Chronic diastolic CHF (congestive heart failure) ([place])   . Clotting disorder ([place]) 2014   placed on Warfarin  . Complication of anesthesia    difficult to wake up from anesthesia  . Coronary artery disease involving native coronary artery without angina pectoris 03/26/2016   100% chronic occlusion of RCA  . Dyspnea   . Heart murmur   . History of rheumatic fever    as a child  . Hyperlipidemia   . Hypertension   . Mitral stenosis   . OSA (obstructive sleep apnea)    mild per sleep study 2008  . Paroxysmal atrial fibrillation ([facility])   . Pneumonia    double pneumonia once  . Pulmonary hypertension ([place])   . Rheumatic mitral regurgitation   . S/P balloon mitral valvuloplasty 2008   . 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 S/P mitral valve replacement with mechanical valve 05/28/2016   29 mm Sorin Carbomedics Optiform bileaflet mechanical prosthetic valve  . Stroke ([place])    x2    BP 132/80 (BP Location: Left Arm, Patient Position: Sitting, Cuff Size: Normal)   Pulse 65   Temp 97.6 F (36.4 C) (Oral)   Ht 5' 1.5" (1.562 m)   Wt 138 lb 8 oz (62.8 kg)   SpO2 98%   BMI 25.75 kg/m  Gen: awake, alert, appearing stated age Lungs: No accessory muscle use Skin: See below. No drainage, erythema, TTP, fluctuance, excessive warmth Psych: Age appropriate judgment and insight   LLE   LLE  Excoriation -  Plan: silver sulfADIAZINE (SILVADENE) 1 % cream  Orders as above. Eucerin prn, may need more than 2-3x/d. Saran wrap at night to keep barrier and help prevent any overnight itching. Warning signs and symptoms verbalized and written down in AVS.  F/u prn. The patient voiced understanding and agreement to the plan.  [place], DO 05/25/18 2:37 PM

## 2018-06-05 ENCOUNTER — Ambulatory Visit (INDEPENDENT_AMBULATORY_CARE_PROVIDER_SITE_OTHER): Payer: Medicare Other | Admitting: *Deleted

## 2018-06-05 DIAGNOSIS — I4891 Unspecified atrial fibrillation: Secondary | ICD-10-CM

## 2018-06-05 DIAGNOSIS — Z5181 Encounter for therapeutic drug level monitoring: Secondary | ICD-10-CM | POA: Diagnosis not present

## 2018-06-05 DIAGNOSIS — I480 Paroxysmal atrial fibrillation: Secondary | ICD-10-CM | POA: Diagnosis not present

## 2018-06-05 LAB — POCT INR: INR: 3.2 — AB (ref 2.0–3.0)

## 2018-06-05 NOTE — Patient Instructions (Signed)
Description   Today take 1/2 tablet, then Continue 1 tablet daily except for 1.5 every Monday. Eat 2 servings each week of leafy green vegetable.   Repeat INR in 3 weeks

## 2018-06-09 ENCOUNTER — Telehealth: Payer: Self-pay | Admitting: Medical

## 2018-06-09 ENCOUNTER — Other Ambulatory Visit: Payer: Self-pay | Admitting: Family

## 2018-06-09 DIAGNOSIS — I10 Essential (primary) hypertension: Secondary | ICD-10-CM

## 2018-06-09 MED ORDER — OSELTAMIVIR PHOSPHATE 75 MG PO CAPS: 75.0000 mg | ORAL_CAPSULE | Freq: Two times a day (BID) | ORAL | 0 refills | Status: DC

## 2018-06-09 NOTE — Telephone Encounter (Signed)
Patient's daughter has flu.  Test came back positive for type A.  Making Tamiflu available for early onset flulike signs and symptoms.  Daughter will explain this to mother.

## 2018-06-13 ENCOUNTER — Encounter: Payer: Self-pay | Admitting: Internal Medicine

## 2018-06-26 ENCOUNTER — Ambulatory Visit (INDEPENDENT_AMBULATORY_CARE_PROVIDER_SITE_OTHER): Payer: Medicare Other | Admitting: *Deleted

## 2018-06-26 DIAGNOSIS — I480 Paroxysmal atrial fibrillation: Secondary | ICD-10-CM

## 2018-06-26 DIAGNOSIS — Z5181 Encounter for therapeutic drug level monitoring: Secondary | ICD-10-CM

## 2018-06-26 DIAGNOSIS — I4891 Unspecified atrial fibrillation: Secondary | ICD-10-CM | POA: Diagnosis not present

## 2018-06-26 LAB — POCT INR: INR: 3.7 — AB (ref 2.0–3.0)

## 2018-06-26 NOTE — Patient Instructions (Signed)
Description   Skip today's dose, then start taking 1 tablet daily.  Eat 2 servings each week of leafy green vegetable.   Repeat INR in 2 weeks

## 2018-07-07 ENCOUNTER — Ambulatory Visit: Payer: Self-pay

## 2018-07-07 NOTE — Telephone Encounter (Signed)
Patient called in with c/o "feet swelling." She says "the swelling is mostly my feet and ankles, but it's to my lower legs, right more than the left. I have to wear larger shoes since the swelling started. I don't really know when it started. I also have tingling, pin/needles like pain to my feet when I'm sleep. It wakes me up and I get up to take Tylenol." I asked about other symptoms, she says "I do give out of breath easily when I'm walking and I feel tired. No chest pain, a little pain in the calf, but not bad pain." According to protocol, see PCP within 24 hours, appointment scheduled for tomorrow at 0915 with Dr. Charlett Blake at [facility], care advice given, patient verbalized understanding.  Reason for Disposition . [1] MODERATE leg swelling (e.g., swelling extends up to knees) AND [2] new onset or worsening  Answer Assessment - Initial Assessment Questions 1. LOCATION: "Which joint is swollen?"     Both feet and ankles 2. ONSET: "When did the swelling start?"     I don't really know 3. SIZE: "How large is the swelling?"     Larger shoe size due to the swelling 4. PAIN: "Is there any pain?" If so, ask: "How bad is it?" (Scale 1-10; or mild, moderate, severe)     Yes, tingling sensation at night, 9 5. CAUSE: "What do you think caused the swollen joint?"     I don't know 6. OTHER SYMPTOMS: "Do you have any other symptoms?" (e.g., fever, chest pain, difficulty breathing, calf pain)     Give out of breath walking and feel tired, swelling to lower legs 7. PREGNANCY: "Is there any chance you are pregnant?" "When was your last menstrual period?"     No  Answer Assessment - Initial Assessment Questions 1. ONSET: "When did the swelling start?" (e.g., minutes, hours, days)     I don't know 2. LOCATION: "What part of the leg is swollen?"  "Are both legs swollen or just one leg?"     Both ankles and feet, both lower legs 3. SEVERITY: "How bad is the swelling?" (e.g., localized; mild,  moderate, severe)  - Localized - small area of swelling localized to one leg  - MILD pedal edema - swelling limited to foot and ankle, pitting edema < 1/4 inch (6 mm) deep, rest and elevation eliminate most or all swelling  - MODERATE edema - swelling of lower leg to knee, pitting edema > 1/4 inch (6 mm) deep, rest and elevation only partially reduce swelling  - SEVERE edema - swelling extends above knee, facial or hand swelling present      Moderate 4. REDNESS: "Does the swelling look red or infected?"     Can't really tell due to being dark skin 5. PAIN: "Is the swelling painful to touch?" If so, ask: "How painful is it?"   (Scale 1-10; mild, moderate or severe)     9 6. FEVER: "Do you have a fever?" If so, ask: "What is it, how was it measured, and when did it start?"      No 7. CAUSE: "What do you think is causing the leg swelling?"     I don't know 8. MEDICAL HISTORY: "Do you have a history of heart failure, kidney disease, liver failure, or cancer?"     Open heart surgery with mechanical valve, liver disease 9. RECURRENT SYMPTOM: "Have you had leg swelling before?" If so, ask: "When was the last time?" "What happened  that time?"     No 10. OTHER SYMPTOMS: "Do you have any other symptoms?" (e.g., chest pain, difficulty breathing)       Breathing gives out when walking, get tired 11. PREGNANCY: "Is there any chance you are pregnant?" "When was your last menstrual period?"       No  Protocols used: LEG SWELLING AND EDEMA-A-AH, ANKLE SWELLING-A-AH

## 2018-07-08 ENCOUNTER — Ambulatory Visit: Payer: Medicare Other | Admitting: Family Medicine

## 2018-07-08 ENCOUNTER — Encounter: Payer: Self-pay | Admitting: Family Medicine

## 2018-07-08 DIAGNOSIS — L299 Pruritus, unspecified: Secondary | ICD-10-CM | POA: Insufficient documentation

## 2018-07-08 DIAGNOSIS — I4891 Unspecified atrial fibrillation: Secondary | ICD-10-CM

## 2018-07-08 DIAGNOSIS — I10 Essential (primary) hypertension: Secondary | ICD-10-CM

## 2018-07-08 DIAGNOSIS — I480 Paroxysmal atrial fibrillation: Secondary | ICD-10-CM

## 2018-07-08 DIAGNOSIS — E639 Nutritional deficiency, unspecified: Secondary | ICD-10-CM | POA: Insufficient documentation

## 2018-07-08 DIAGNOSIS — R609 Edema, unspecified: Secondary | ICD-10-CM

## 2018-07-08 MED ORDER — POTASSIUM CHLORIDE ER 10 MEQ PO TBCR: EXTENDED_RELEASE_TABLET | ORAL | 1 refills | Status: DC

## 2018-07-08 MED ORDER — FUROSEMIDE 40 MG PO TABS: ORAL_TABLET | ORAL | 1 refills | Status: DC

## 2018-07-08 NOTE — Assessment & Plan Note (Signed)
Due to liver disease try Claritin in am and benadryl in pm. Sarna lotion

## 2018-07-08 NOTE — Patient Instructions (Signed)
Take Furosemide 40 mg twice daily x 5 days and then can drop back to 1 tab daily  Make sure to weight yourself daily and any time you gain more than 2-3 pounds in 24 hours then you can take an extra lasix pill. Make sur to take an extra potassium pill when you take an extra Furosemide  For the itching take Claritin/Loratadine 10 mg tab each morning and Benadryl at bedtime and can try Sarna anti itch lotion

## 2018-07-08 NOTE — Assessment & Plan Note (Signed)
Irregular but rate controlled today. Tolerating Coumadin has a recheck on her INR in 2 days

## 2018-07-08 NOTE — Assessment & Plan Note (Signed)
Well controlled, no changes to meds. Encouraged heart healthy diet such as the DASH diet and exercise as tolerated.  °

## 2018-07-08 NOTE — Progress Notes (Signed)
Subjective:    Patient ID: Marie Malone, female    DOB: [birth year], 68 y.o.   MRN: [id number]  Chief Complaint  Patient presents with  . Edema    bilateral lower legs    HPI Patient is in today for evaluation of worsening pedal edema for the past week or so. She has been taking her lasix daily but edema has worsened recently. No recent febrile illness or hospitalizations. No muscle cramps or myalgias. Her other complaint today is of persistent pruritis in legs and diffusely. Denies CP/palp/SOB/HA/congestion/fevers/GI or GU c/o. Taking meds as prescribed  Past Medical History:  Diagnosis Date  . Atrial fibrillation ([place])   . Blood transfusion without reported diagnosis 05/2016  . Chronic diastolic CHF (congestive heart failure) ([place])   . Clotting disorder ([place]) 2014   placed on Warfarin  . Complication of anesthesia    difficult to wake up from anesthesia  . Coronary artery disease involving native coronary artery without angina pectoris 03/26/2016   100% chronic occlusion of RCA  . Dyspnea   . Heart murmur   . History of rheumatic fever    as a child  . Hyperlipidemia   . Hypertension   . Mitral stenosis   . OSA (obstructive sleep apnea)    mild per sleep study 2008  . Paroxysmal atrial fibrillation ([facility])   . Pneumonia    double pneumonia once  . Pulmonary hypertension ([place])   . Rheumatic mitral regurgitation   . S/P balloon mitral valvuloplasty 2008   . 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 S/P mitral valve replacement with mechanical valve 05/28/2016   29 mm Sorin Carbomedics Optiform bileaflet mechanical prosthetic valve  . Stroke [facility])    x2    Past Surgical History:  Procedure Laterality Date  . APPENDECTOMY  1989   appendix ruptured,had peritonitis  . BALLOON VALVULOPLASTY  2008   [facility]  . CARDIAC CATHETERIZATION N/A 03/25/2016   Procedure: Right/Left Heart Cath and Coronary Angiography;  Surgeon: Jettie Booze, MD;   Location: [place] [facility];  Service: Cardiovascular;  Laterality: N/A;  . COLONOSCOPY W/ POLYPECTOMY    . CORONARY ARTERY BYPASS GRAFT N/A 05/28/2016   Procedure: CORONARY ARTERY BYPASS GRAFTING (CABG) x 1 WITH ENDOSCOPIC HARVESTING OF RIGHT SAPHENOUS VEIN, EVH- PDA;  Surgeon: Rexene Alberts, MD;  Location: [place];  Service: Open Heart Surgery;  Laterality: N/A;  . ENDARTERECTOMY Right 03/10/2016   Procedure: ENDARTERECTOMY CAROTID RIGHT;  Surgeon: Serafina Mitchell, MD;  Location: [place];  Service: Vascular;  Laterality: Right;  . EP IMPLANTABLE DEVICE N/A 03/11/2016   Procedure: Loop Recorder Removal;  Surgeon: Deboraha Sprang, MD;  Location: [place] [facility];  Service: Cardiovascular;  Laterality: N/A;  . 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 LOOP RECORDER IMPLANT N/A 04/10/2013   Procedure: LOOP RECORDER IMPLANT;  Surgeon: Coralyn Mark, MD;  Location: [place] C[facility];  Service: Cardiovascular;  Laterality: N/A;  . MAZE N/A 05/28/2016   Procedure: MAZE PROCEDURE AND APPLICATION OF  ATRICLIP LAA PROCLIP II 27 MM;  Surgeon: Rexene Alberts, MD;  Location: [place];  Service: Open Heart Surgery;  Laterality: N/A;  . MITRAL VALVE REPLACEMENT N/A 05/28/2016   Procedure: MITRAL VALVE (MV) REPLACEMENT USING 29 MM CARBOMEDICS OPTIFORM MECHANICAL BILEAFLET PROSTHETIC HEART VALVE;  Surgeon: Rexene Alberts, MD;  Location[facility]  OR;  Service: Open Heart Surgery;  Laterality: N/A;  . MULTIPLE EXTRACTIONS WITH ALVEOLOPLASTY N/A 03/30/2016   Procedure: EXTRACTION OF TOOTH #'S 2,5,6,11,15,16,20-30 AND 32 WITH ALVEOLOPLASTY AND BILATERAL MAXILLARY LATERAL EXOSTOSES REDUCTIONS;  Surgeon: Lenn Cal, DDS;  Location: [place];  Service: Oral Surgery;  Laterality: N/A;  . PATCH ANGIOPLASTY Right 03/10/2016   Procedure: PATCH ANGIOPLASTY CAROTID RIGHT USING Rueben Bash BIOLOGIC PATCH;  Surgeon: Serafina Mitchell, MD;  Location: [place];  Service: Vascular;  Laterality: Right;    . TEE WITHOUT CARDIOVERSION N/A 04/10/2013   Procedure: TRANSESOPHAGEAL ECHOCARDIOGRAM (TEE);  Surgeon: [name], MD;  [facility] ENDOSCOPY;  Service: Cardiovascular;  Laterality: N/A;  . TEE WITHOUT CARDIOVERSION N/A 03/24/2016   Procedure: TRANSESOPHAGEAL ECHOCARDIOGRAM (TEE);  Surgeon: Jerline Pain, MD;  Location: [place];  Service: Cardiovascular;  Laterality: N/A;  . TEE WITHOUT CARDIOVERSION N/A 05/28/2016   Procedure: TRANSESOPHAGEAL ECHOCARDIOGRAM (TEE);  Surgeon: Rexene Alberts, MD;  Location: [place];  Service: Open Heart Surgery;  Laterality: N/A;  . TUBAL LIGATION      Family History  Problem Relation Age of Onset  . Diabetes Mother   . Stroke Mother   . Diabetes Father   . Heart disease Father        CHF  . Cancer Father        kidney  . Diabetes Sister   . Diabetes Brother   . Hypertension Daughter   . Colon polyps Neg Hx   . Colon cancer Neg Hx   . Esophageal cancer Neg Hx   . Rectal cancer Neg Hx   . Stomach cancer Neg Hx     Social History   Socioeconomic History  . Marital status: Married    Spouse name: Not on file  . Number of children: 2  . Years of education: Associates  . Highest education level: Not on file  Occupational History  . Not on file  Social Needs  . Financial resource strain: Not on file  . Food insecurity:    Worry: Not on file    Inability: Not on file  . Transportation needs:    Medical: Not on file    Non-medical: Not on file  Tobacco Use  . Smoking status: Former Smoker    Packs/day: 1.00    Years: 5.00    Pack years: 5.00    Types: Cigarettes    Start date: 07/15/1970    Last attempt to quit: 05/11/1975    Years since quitting: 43.1  . Smokeless tobacco: Never Used  . Tobacco comment: quit smoking 36 years ago  Substance and Sexual Activity  . Alcohol use: No    Alcohol/week: 0.0 standard drinks  . Drug use: No  . Sexual activity: Not on file  Lifestyle  . Physical activity:    Days per week: Not on  file    Minutes per session: Not on file  . Stress: Not on file  Relationships  . Social connections:    Talks on phone: Not on file    Gets together: Not on file    Attends religious service: Not on file    Active member of club or organization: Not on file    Attends meetings of clubs or organizations: Not on file    Relationship status: Not on file  . Intimate partner violence:    Fear of current or ex partner: Not on file    Emotionally abused: Not on file    Physically abused:  Not on file    Forced sexual activity: Not on file  Other Topics Concern  . Not on file  Social History Narrative   Retired Archivist   Married   She has 2 grown children-   Daughter lives in [place]   Son lives in [place]   6 grandchildren     Outpatient Medications Prior to Visit  Medication Sig Dispense Refill  . aspirin EC 81 MG EC tablet Take 1 tablet (81 mg total) by mouth daily.    [name] Kitchen atorvastatin (LIPITOR) 80 MG tablet Take 0.5 tablets (40 mg total) by mouth daily at 6 PM. 45 tablet 1  . losartan (COZAAR) 25 MG tablet TAKE 1 TABLET BY MOUTH ONCE DAILY *NEED OFFICE VISIT* 30 tablet 5  . metoprolol succinate (TOPROL-XL) 50 MG 24 hr tablet TAKE 1 TABLET BY MOUTH ONCE DAILY OR IMMEDIATELY FOLLOWING MEALS *NEED OFFICE VISIT* 30 tablet 5  . ursodiol (ACTIGALL) 500 MG tablet Take 1 tablet (500 mg total) by mouth 2 (two) times daily. 60 tablet 11  . warfarin (COUMADIN) 4 MG tablet TAKE 1 TO 1 & 1/2 (ONE & ONE-HALF) TABLETS BY MOUTH ONCE DAILY AS DIRECTED BY COUMADIN CLINIC 135 tablet 0  . furosemide (LASIX) 40 MG tablet TAKE 1 TABLET BY MOUTH ONCE DAILY *NEED OFFICE VISIT* 30 tablet 5  . potassium chloride (K-DUR) 10 MEQ tablet TAKE 1 TABLET BY MOUTH ONCE DAILY 90 tablet 1  . silver sulfADIAZINE (SILVADENE) 1 % cream Apply 1 application topically daily. (Patient not taking: Reported on 07/08/2018) 50 g 0  . oseltamivir (TAMIFLU) 75 MG capsule Take 1 capsule (75 mg total) by mouth 2  (two) times daily. 10 capsule 0   No facility-administered medications prior to visit.     Allergies  Allergen Reactions  . No Known Allergies     Review of Systems  Constitutional: Negative for fever and malaise/fatigue.  HENT: Negative for congestion.   Eyes: Negative for blurred vision.  Respiratory: Positive for shortness of breath.   Cardiovascular: Positive for leg swelling. Negative for chest pain and palpitations.  Gastrointestinal: Negative for abdominal pain, blood in stool and nausea.  Genitourinary: Negative for dysuria and frequency.  Musculoskeletal: Negative for falls.  Skin: Positive for itching. Negative for rash.  Neurological: Negative for dizziness, loss of consciousness and headaches.  Endo/Heme/Allergies: Negative for environmental allergies.  Psychiatric/Behavioral: Negative for depression. The patient is not nervous/anxious.        Objective:    Physical Exam Vitals signs and nursing note reviewed.  Constitutional:      General: She is not in acute distress.    Appearance: She is well-developed.  HENT:     Head: Normocephalic and atraumatic.     Nose: Nose normal.  Eyes:     General:        Right eye: No discharge.        Left eye: No discharge.  Neck:     Musculoskeletal: Normal range of motion and neck supple.  Cardiovascular:     Rate and Rhythm: Normal rate. Rhythm irregular.     Heart sounds: No murmur.  Pulmonary:     Effort: Pulmonary effort is normal.     Breath sounds: Normal breath sounds.  Abdominal:     General: Bowel sounds are normal.     Palpations: Abdomen is soft.     Tenderness: There is no abdominal tenderness.  Musculoskeletal:     Right lower leg: Edema present.  Left lower leg: Edema present.  Skin:    General: Skin is warm and dry.  Neurological:     Mental Status: She is alert and oriented to person, place, and time.     BP 120/72 (BP Location: Left Arm, Patient Position: Sitting, Cuff Size: Normal)    Pulse 77   Temp 97.7 F (36.5 C) (Oral)   Ht 5' 1.5" (1.562 m)   Wt 145 lb 8 oz (66 kg)   SpO2 96%   BMI 27.05 kg/m  Wt Readings from Last 3 Encounters:  07/08/18 145 lb 8 oz (66 kg)  05/25/18 138 lb 8 oz (62.8 kg)  05/15/18 139 lb (63 kg)     Lab Results  Component Value Date   WBC 2.5 (L) 11/07/2017   HGB 10.6 (L) 11/07/2017   HCT 33.0 (L) 11/07/2017   PLT 88.0 (L) 11/07/2017   GLUCOSE 64 (L) 05/15/2018   CHOL 107 03/28/2017   TRIG 53 03/28/2017   HDL 61 03/28/2017   LDLCALC 35 03/28/2017   ALT 24 05/15/2018   AST 45 (H) 05/15/2018   NA 138 05/15/2018   K 3.7 05/15/2018   CL 103 05/15/2018   CREATININE 0.89 05/15/2018   BUN 21 05/15/2018   CO2 27 05/15/2018   TSH 1.90 06/06/2017   INR 3.7 (A) 06/26/2018   HGBA1C 5.5 05/28/2016    Lab Results  Component Value Date   TSH 1.90 06/06/2017   Lab Results  Component Value Date   WBC 2.5 (L) 11/07/2017   HGB 10.6 (L) 11/07/2017   HCT 33.0 (L) 11/07/2017   MCV 89.0 11/07/2017   PLT 88.0 (L) 11/07/2017   Lab Results  Component Value Date   NA 138 05/15/2018   K 3.7 05/15/2018   CO2 27 05/15/2018   GLUCOSE 64 (L) 05/15/2018   BUN 21 05/15/2018   CREATININE 0.89 05/15/2018   BILITOT 1.1 05/15/2018   ALKPHOS 88 05/15/2018   AST 45 (H) 05/15/2018   ALT 24 05/15/2018   PROT 8.9 (H) 05/15/2018   ALBUMIN 3.9 05/15/2018   CALCIUM 9.2 05/15/2018   ANIONGAP 9 06/04/2016   GFR 81.51 05/15/2018   Lab Results  Component Value Date   CHOL 107 03/28/2017   Lab Results  Component Value Date   HDL 61 03/28/2017   Lab Results  Component Value Date   LDLCALC 35 03/28/2017   Lab Results  Component Value Date   TRIG 53 03/28/2017   Lab Results  Component Value Date   CHOLHDL 1.8 03/28/2017   Lab Results  Component Value Date   HGBA1C 5.5 05/28/2016       Assessment & Plan:   Problem List Items Addressed This Visit    Essential hypertension (Chronic)    Well controlled, no changes to meds.  Encouraged heart healthy diet such as the DASH diet and exercise as tolerated.       Relevant Medications   furosemide (LASIX) 40 MG tablet   Paroxysmal atrial fibrillation (HCC) (Chronic)    Irregular but rate controlled today. Tolerating Coumadin has a recheck on her INR in 2 days      Relevant Medications   furosemide (LASIX) 40 MG tablet   Pruritus    Due to liver disease try Claritin in am and benadryl in pm. Sarna lotion      Edema due to nutritional deficiency    Take Furosemide 40 mg twice daily x 5 days and then can drop back to 1 tab  daily  Make sure to weight yourself daily and any time you gain more than 2-3 pounds in 24 hours then you can take an extra lasix pill. Make sur to take an extra potassium pill when you take an extra Furosemide        Other Visit Diagnoses    Atrial fibrillation ([place])       Relevant Medications   furosemide (LASIX) 40 MG tablet      I have discontinued Wandra Arthurs. Jeanlouis's oseltamivir. I have also changed her furosemide and potassium chloride. Additionally, I am having her maintain her aspirin, atorvastatin, ursodiol, warfarin, silver sulfADIAZINE, losartan, and metoprolol succinate.  Meds ordered this encounter  Medications  . furosemide (LASIX) 40 MG tablet    Sig: Take 1 tab po daily and may take a 2nd tab daily prn edema or weight gain>3# in 24 hours    Dispense:  120 tablet    Refill:  1  . potassium chloride (K-DUR) 10 MEQ tablet    Sig: Take 1 tab daily and a 2nd tab daily prn when you take a 2nd Furosemide tab in a day    Dispense:  120 tablet    Refill:  1     Penni Homans, MD

## 2018-07-08 NOTE — Assessment & Plan Note (Signed)
Take Furosemide 40 mg twice daily x 5 days and then can drop back to 1 tab daily  Make sure to weight yourself daily and any time you gain more than 2-3 pounds in 24 hours then you can take an extra lasix pill. Make sur to take an extra potassium pill when you take an extra Furosemide

## 2018-07-10 ENCOUNTER — Ambulatory Visit: Payer: Self-pay | Admitting: Family

## 2018-07-10 ENCOUNTER — Ambulatory Visit (INDEPENDENT_AMBULATORY_CARE_PROVIDER_SITE_OTHER): Payer: Medicare Other | Admitting: *Deleted

## 2018-07-10 DIAGNOSIS — Z5181 Encounter for therapeutic drug level monitoring: Secondary | ICD-10-CM | POA: Diagnosis not present

## 2018-07-10 DIAGNOSIS — I480 Paroxysmal atrial fibrillation: Secondary | ICD-10-CM | POA: Diagnosis not present

## 2018-07-10 DIAGNOSIS — I4891 Unspecified atrial fibrillation: Secondary | ICD-10-CM

## 2018-07-10 LAB — POCT INR: INR: 2.9 (ref 2.0–3.0)

## 2018-07-10 NOTE — Patient Instructions (Signed)
Description   Continue  taking 1 tablet daily.  Eat 2 servings each week of leafy green vegetable.   Repeat INR in 2 weeks

## 2018-07-11 ENCOUNTER — Telehealth: Payer: Self-pay | Admitting: *Deleted

## 2018-07-25 ENCOUNTER — Ambulatory Visit: Payer: Medicare Other | Admitting: Family

## 2018-07-25 ENCOUNTER — Encounter: Payer: Self-pay | Admitting: Family

## 2018-07-25 ENCOUNTER — Other Ambulatory Visit: Payer: Self-pay

## 2018-07-25 ENCOUNTER — Telehealth: Payer: Self-pay

## 2018-07-25 VITALS — BP 149/65 | HR 63 | Temp 97.6°F | Resp 16 | Wt 136.0 lb

## 2018-07-25 DIAGNOSIS — L299 Pruritus, unspecified: Secondary | ICD-10-CM

## 2018-07-25 DIAGNOSIS — R600 Localized edema: Secondary | ICD-10-CM

## 2018-07-25 DIAGNOSIS — I878 Other specified disorders of veins: Secondary | ICD-10-CM

## 2018-07-25 DIAGNOSIS — R945 Abnormal results of liver function studies: Secondary | ICD-10-CM

## 2018-07-25 DIAGNOSIS — R7989 Other specified abnormal findings of blood chemistry: Secondary | ICD-10-CM

## 2018-07-25 MED ORDER — BETAMETHASONE DIPROPIONATE 0.05 % EX CREA: TOPICAL_CREAM | Freq: Two times a day (BID) | CUTANEOUS | 1 refills | Status: DC

## 2018-07-25 MED ORDER — POTASSIUM CHLORIDE ER 10 MEQ PO TBCR: 10.0000 meq | EXTENDED_RELEASE_TABLET | Freq: Two times a day (BID) | ORAL | 1 refills | Status: AC

## 2018-07-25 MED ORDER — FUROSEMIDE 20 MG PO TABS: ORAL_TABLET | ORAL | 3 refills | Status: AC

## 2018-07-25 MED ORDER — ATORVASTATIN CALCIUM 80 MG PO TABS: 40.0000 mg | ORAL_TABLET | Freq: Every day | ORAL | 1 refills | Status: AC

## 2018-07-25 NOTE — Patient Instructions (Signed)
Please change furosemide to 20mg  and take 2 tabs in the AM and 1 tab in the PM.

## 2018-07-25 NOTE — Telephone Encounter (Signed)
Covid-19 travel screening questions  Have you traveled in the last 14 days? No If yes where?  Do you now or have you had a fever in the last 14 days? No  Do you have any respiratory symptoms of shortness of breath or cough now or in the last 14 days? No  Do you have a medical history of Congestive Heart Failure? N/A  Do you have a medical history of lung disease? N/A  Do you have any family members or close contacts with diagnosed or suspected Covid-19? No  Pt plans to keep OV as scheduled.        

## 2018-07-25 NOTE — Progress Notes (Signed)
Subjective:    Patient ID: Marie Malone, female    DOB: 1950-06-10, 68 y.o.   MRN: [id number]  HPI  Patient presents today with chief complaint of bilateral foot pain and leg swelling. Reports that she took furosemide bid x 5 days following her visit with Dr. Charlett Blake.   Reports that she is mildly sob, notes that she tires easily and gets winded.    Reports bilateral leg itching and bilateral foot pain. Also concerned about the hyperpigmentation in her bilateral LE.   Wt Readings from Last 3 Encounters:  07/25/18 136 lb (61.7 kg)  07/08/18 145 lb 8 oz (66 kg)  05/25/18 138 lb 8 oz (62.8 kg)     Review of Systems See HPI  Past Medical History:  Diagnosis Date   Atrial fibrillation ([place])    Blood transfusion without reported diagnosis 05/2016   Chronic diastolic CHF (congestive heart failure) ([place])    Clotting disorder ([place]) 2014   placed on Warfarin   Complication of anesthesia    difficult to wake up from anesthesia   Coronary artery disease involving native coronary artery without angina pectoris 03/26/2016   100% chronic occlusion of RCA   Dyspnea    Heart murmur    History of rheumatic fever    as a child   Hyperlipidemia    Hypertension    Mitral stenosis    OSA (obstructive sleep apnea)    mild per sleep study 2008   Paroxysmal atrial fibrillation (HCC)    Pneumonia    double pneumonia once   Pulmonary hypertension (HCC)    Rheumatic mitral regurgitation    S/P balloon mitral valvuloplasty 2008    S/P CABG x 1 05/28/2016   SVG to PDA with [facility] via right thigh   S/P mitral valve replacement with mechanical valve 05/28/2016   29 mm Sorin Carbomedics Optiform bileaflet mechanical prosthetic valve   Stroke ([place])    x2     Social History   Socioeconomic History   Marital status: Married    Spouse name: Not on file   Number of children: 2   Years of education: Associates   Highest education level: Not on file  Occupational History     Not on file  Social Needs   Financial resource strain: Not on file   Food insecurity:    Worry: Not on file    Inability: Not on file   Transportation needs:    Medical: Not on file    Non-medical: Not on file  Tobacco Use   Smoking status: Former Smoker    Packs/day: 1.00    Years: 5.00    Pack years: 5.00    Types: Cigarettes    Start date: 07/15/1970    Last attempt to quit: 05/11/1975    Years since quitting: 43.2   Smokeless tobacco: Never Used   Tobacco comment: quit smoking 36 years ago  Substance and Sexual Activity   Alcohol use: No    Alcohol/week: 0.0 standard drinks   Drug use: No   Sexual activity: Not on file  Lifestyle   Physical activity:    Days per week: Not on file    Minutes per session: Not on file   Stress: Not on file  Relationships   Social connections:    Talks on phone: Not on file    Gets together: Not on file    Attends religious service: Not on file    Active member of club or organization:  Not on file    Attends meetings of clubs or organizations: Not on file    Relationship status: Not on file   Intimate partner violence:    Fear of current or ex partner: Not on file    Emotionally abused: Not on file    Physically abused: Not on file    Forced sexual activity: Not on file  Other Topics Concern   Not on file  Social History Narrative   Retired Environmental manager [employer]   Married   She has 2 grown children-   Daughter lives in [place]   Son lives in [place]   6 grandchildren     Past Surgical History:  Procedure Laterality Date   APPENDECTOMY  1989   appendix ruptured,had peritonitis   BALLOON VALVULOPLASTY  2008   [place] N/A 03/25/2016   Procedure: Right/Left Heart Cath and Coronary Angiography;  Surgeon: Jettie Booze, MD;  Location: [place] [facility];  Service: Cardiovascular;  Laterality: N/A;   COLONOSCOPY W/ POLYPECTOMY     CORONARY ARTERY BYPASS GRAFT N/A 05/28/2016    Procedure: CORONARY ARTERY BYPASS GRAFTING (CABG) x 1 WITH ENDOSCOPIC HARVESTING OF RIGHT SAPHENOUS VEIN, EVH- PDA;  Surgeon: Rexene Alberts, MD;  Location: [place];  Service: Open Heart Surgery;  Laterality: N/A;   ENDARTERECTOMY Right 03/10/2016   Procedure: ENDARTERECTOMY CAROTID RIGHT;  Surgeon: Serafina Mitchell, MD;  Location: [place];  Service: Vascular;  Laterality: Right;   EP IMPLANTABLE DEVICE N/A 03/11/2016   Procedure: Loop Recorder Removal;  Surgeon: Deboraha Sprang, MD;  Location: [place] [facility];  Service: Cardiovascular;  Laterality: N/A;   LOOP RECORDER IMPLANT  04-10-2013   MDT LinQ implanted by Dr Rayann Heman for cryptogenic stroke   LOOP RECORDER IMPLANT N/A 04/10/2013   Procedure: LOOP RECORDER IMPLANT;  Surgeon: Coralyn Mark, MD;  Location: [place] [facility];  Service: Cardiovascular;  Laterality: N/A;   MAZE N/A 05/28/2016   Procedure: MAZE PROCEDURE AND APPLICATION OF  ATRICLIP LAA PROCLIP II 55 MM;  Surgeon: Rexene Alberts, MD;  Location: [place];  Service: Open Heart Surgery;  Laterality: N/A;   MITRAL VALVE REPLACEMENT N/A 05/28/2016   Procedure: MITRAL VALVE (MV) REPLACEMENT USING 29 MM CARBOMEDICS OPTIFORM MECHANICAL BILEAFLET PROSTHETIC HEART VALVE;  Surgeon: Rexene Alberts, MD;  Location: [place];  Service: Open Heart Surgery;  Laterality: N/A;   MULTIPLE EXTRACTIONS WITH ALVEOLOPLASTY N/A 03/30/2016   Procedure: EXTRACTION OF TOOTH #'S 2,5,6,11,15,16,20-30 AND 32 WITH ALVEOLOPLASTY AND BILATERAL MAXILLARY LATERAL EXOSTOSES REDUCTIONS;  Surgeon: Lenn Cal, DDS;  Location: [place];  Service: Oral Surgery;  Laterality: N/A;   PATCH ANGIOPLASTY Right 03/10/2016   Procedure: PATCH ANGIOPLASTY CAROTID RIGHT USING Rueben Bash BIOLOGIC PATCH;  Surgeon: Serafina Mitchell, MD;  Location: [place];  Service: Vascular;  Laterality: Right;   TEE WITHOUT CARDIOVERSION N/A 04/10/2013   Procedure: TRANSESOPHAGEAL ECHOCARDIOGRAM (TEE);  Surgeon: [name], MD;  Location: [facility];   Service: Cardiovascular;  Laterality: N/A;   TEE WITHOUT CARDIOVERSION N/A 03/24/2016   Procedure: TRANSESOPHAGEAL ECHOCARDIOGRAM (TEE);  Surgeon: Jerline Pain, MD;  Location: [place];  Service: Cardiovascular;  Laterality: N/A;   TEE WITHOUT CARDIOVERSION N/A 05/28/2016   Procedure: TRANSESOPHAGEAL ECHOCARDIOGRAM (TEE);  Surgeon: Rexene Alberts, MD;  Location: [place];  Service: Open Heart Surgery;  Laterality: N/A;   TUBAL LIGATION      Family History  Problem Relation Age of Onset   Diabetes Mother  Stroke Mother    Diabetes Father    Heart disease Father        CHF   Cancer Father        kidney   Diabetes Sister    Diabetes Brother    Hypertension Daughter    Colon polyps Neg Hx    Colon cancer Neg Hx    Esophageal cancer Neg Hx    Rectal cancer Neg Hx    Stomach cancer Neg Hx     Allergies  Allergen Reactions   No Known Allergies     Current Outpatient Medications on File Prior to Visit  Medication Sig Dispense Refill   aspirin EC 81 MG EC tablet Take 1 tablet (81 mg total) by mouth daily.     atorvastatin (LIPITOR) 80 MG tablet Take 0.5 tablets (40 mg total) by mouth daily at 6 PM. 45 tablet 1   furosemide (LASIX) 40 MG tablet Take 1 tab po daily and may take a 2nd tab daily prn edema or weight gain>3# in 24 hours 120 tablet 1   losartan (COZAAR) 25 MG tablet TAKE 1 TABLET BY MOUTH ONCE DAILY *NEED OFFICE VISIT* 30 tablet 5   metoprolol succinate (TOPROL-XL) 50 MG 24 hr tablet TAKE 1 TABLET BY MOUTH ONCE DAILY OR IMMEDIATELY FOLLOWING MEALS *NEED OFFICE VISIT* 30 tablet 5   potassium chloride (K-DUR) 10 MEQ tablet Take 1 tab daily and a 2nd tab daily prn when you take a 2nd Furosemide tab in a day 120 tablet 1   silver sulfADIAZINE (SILVADENE) 1 % cream Apply 1 application topically daily. (Patient not taking: Reported on 07/08/2018) 50 g 0   ursodiol (ACTIGALL) 500 MG tablet Take 1 tablet (500 mg total) by mouth 2 (two) times daily. 60  tablet 11   warfarin (COUMADIN) 4 MG tablet TAKE 1 TO 1 & 1/2 (ONE & ONE-HALF) TABLETS BY MOUTH ONCE DAILY AS DIRECTED BY COUMADIN CLINIC 135 tablet 0   No current facility-administered medications on file prior to visit.     BP (!) 149/65 (BP Location: Right Arm, Patient Position: Sitting, Cuff Size: Small)    Pulse 63    Temp 97.6 F (36.4 C) (Oral)    Resp 16    Wt 136 lb (61.7 kg)    SpO2 100%    BMI 25.28 kg/m       Objective:   Physical Exam Constitutional:      Appearance: She is well-developed.  Neck:     Musculoskeletal: Neck supple.     Thyroid: No thyromegaly.  Cardiovascular:     Rate and Rhythm: Normal rate and regular rhythm.     Pulses:          Dorsalis pedis pulses are 2+ on the right side and 2+ on the left side.       Posterior tibial pulses are 2+ on the right side and 2+ on the left side.     Heart sounds: Murmur present.  Pulmonary:     Effort: Pulmonary effort is normal. No respiratory distress.     Breath sounds: Normal breath sounds. No wheezing.  Musculoskeletal:     Right lower leg: 3+ Edema present.     Left lower leg: 3+ Edema present.  Skin:    General: Skin is warm and dry.     Comments: Hyperpigmentation of bilateral feet and shins  Neurological:     Mental Status: She is alert and oriented to person, place, and time.  Psychiatric:  Behavior: Behavior normal.        Thought Content: Thought content normal.        Judgment: Judgment normal.           Assessment & Plan:  Bilateral LE edema- I think that the edema is contributing to her discomfort in her feet.  Will change her furosemide to 40mg  AM and 20mg  PM.  Plan follow up in 1 week for follow up bmet and re-evaluation.  Venous stasis-  Hyperpigmentation in bilateral LE is most consistent with chronic venous stasis changes.  We discussed use of support hose and elevation.  Pruritis- bilateral shins. Continue Eucerin cream and add bid betamethasone prn.

## 2018-07-26 ENCOUNTER — Ambulatory Visit: Payer: Medicare Other | Admitting: Internal Medicine

## 2018-07-26 ENCOUNTER — Encounter: Payer: Self-pay | Admitting: Internal Medicine

## 2018-07-26 ENCOUNTER — Ambulatory Visit (INDEPENDENT_AMBULATORY_CARE_PROVIDER_SITE_OTHER): Payer: Medicare Other | Admitting: Pharmacist Clinician (PhC)/ Clinical Pharmacy Specialist

## 2018-07-26 VITALS — BP 112/64 | HR 75 | Temp 97.9°F | Ht 62.0 in | Wt 138.2 lb

## 2018-07-26 DIAGNOSIS — R945 Abnormal results of liver function studies: Secondary | ICD-10-CM

## 2018-07-26 DIAGNOSIS — K743 Primary biliary cirrhosis: Secondary | ICD-10-CM

## 2018-07-26 DIAGNOSIS — I4891 Unspecified atrial fibrillation: Secondary | ICD-10-CM

## 2018-07-26 DIAGNOSIS — I480 Paroxysmal atrial fibrillation: Secondary | ICD-10-CM | POA: Diagnosis not present

## 2018-07-26 DIAGNOSIS — R7989 Other specified abnormal findings of blood chemistry: Secondary | ICD-10-CM

## 2018-07-26 LAB — POCT INR: INR: 2.7 (ref 2.0–3.0)

## 2018-07-26 MED ORDER — URSODIOL 500 MG PO TABS: 500.0000 mg | ORAL_TABLET | Freq: Two times a day (BID) | ORAL | 11 refills | Status: AC

## 2018-07-26 NOTE — Patient Instructions (Addendum)
We have sent the following medications to your pharmacy for you to pick up at your convenience:  Ursodial  Please follow up in one year  To help prevent the possible spread of infection to our patients, communities, and staff; we will be implementing the following measures:  Please only allow one visitor/family member to accompany you to any upcoming appointments with [facility]. If you have any concerns about this please contact our office to discuss prior to the appointment.

## 2018-07-26 NOTE — Progress Notes (Signed)
HISTORY OF PRESENT ILLNESS:  Marie Malone is a 68 y.o. female, former Probation officer at [employer], with multiple significant medical problems as listed below including prior CABG, rheumatic heart disease with mechanical valve replacement in the mitral position on Coumadin, atrial fibrillation, hypertension, hyperlipidemia, prior stroke, and peripheral vascular disease.  Patient was previously evaluated regarding abnormal liver test and possible cirrhosis on hepatic imaging.  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  In July 2019 she was placed on ursodeoxycholic acid 540 mg twice daily.  Principal symptoms at that time were fatigue and pruritus.  She subsequently underwent upper endoscopy December 29, 2017.  No varices.  Routine office follow-up at this time requested.  Patient's follow-up laboratories were performed May 15, 2018.  Her liver tests were improved.  AST was elevated at 45.  Other liver tests normal including ALT of 24, alkaline phosphatase 88, and total bilirubin 1.1.  Patient tells me that she has been clinically stable.  No change in energy levels or pruritus.  No new problems.  REVIEW OF SYSTEMS:   All non-GI ROS negative unless otherwise stated in the HPI except for ankle edema  Past Medical History:  Diagnosis Date  . Atrial fibrillation ([place])   . Blood transfusion without reported diagnosis 05/2016  . Chronic diastolic CHF (congestive heart failure) ([place])   . Clotting disorder ([place]) 2014   placed on Warfarin  . Complication of anesthesia    difficult to wake up from anesthesia  . Coronary artery disease involving native coronary artery without angina pectoris 03/26/2016   100% chronic occlusion of RCA  . Dyspnea   . Heart murmur   . History of rheumatic fever    as a child  . Hyperlipidemia   . Hypertension   . Mitral stenosis   . OSA (obstructive sleep apnea)    mild per sleep study 2008  . Paroxysmal atrial fibrillation ([facility])   . Pneumonia     double pneumonia once  . Pulmonary hypertension ([place])   . Rheumatic mitral regurgitation   . S/P balloon mitral valvuloplasty 2008   . 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 S/P mitral valve replacement with mechanical valve 05/28/2016   29 mm Sorin Carbomedics Optiform bileaflet mechanical prosthetic valve  . Stroke [facility])    x2    Past Surgical History:  Procedure Laterality Date  . APPENDECTOMY  1989   appendix ruptured,had peritonitis  . BALLOON VALVULOPLASTY  2008   [facility]  . CARDIAC CATHETERIZATION N/A 03/25/2016   Procedure: Right/Left Heart Cath and Coronary Angiography;  Surgeon: Jettie Booze, MD;  Location: [place] [facility];  Service: Cardiovascular;  Laterality: N/A;  . COLONOSCOPY W/ POLYPECTOMY    . CORONARY ARTERY BYPASS GRAFT N/A 05/28/2016   Procedure: CORONARY ARTERY BYPASS GRAFTING (CABG) x 1 WITH ENDOSCOPIC HARVESTING OF RIGHT SAPHENOUS VEIN, EVH- PDA;  Surgeon: Rexene Alberts, MD;  Location: [place];  Service: Open Heart Surgery;  Laterality: N/A;  . ENDARTERECTOMY Right 03/10/2016   Procedure: ENDARTERECTOMY CAROTID RIGHT;  Surgeon: Serafina Mitchell, MD;  Location: [place];  Service: Vascular;  Laterality: Right;  . EP IMPLANTABLE DEVICE N/A 03/11/2016   Procedure: Loop Recorder Removal;  Surgeon: Deboraha Sprang, MD;  Location: [place] [facility];  Service: Cardiovascular;  Laterality: N/A;  . 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  LOOP RECORDER IMPLANT N/A 04/10/2013   Procedure: LOOP RECORDER IMPLANT;  Surgeon: Coralyn Mark, MD;  Location: [place] [facility];  Service: Cardiovascular;  Laterality: N/A;  . MAZE N/A 05/28/2016   Procedure: MAZE PROCEDURE AND APPLICATION OF  ATRICLIP LAA PROCLIP II 49 MM;  Surgeon: Rexene Alberts, MD;  Location: [place];  Service: Open Heart Surgery;  Laterality: N/A;  . MITRAL VALVE REPLACEMENT N/A 05/28/2016   Procedure: MITRAL VALVE (MV) REPLACEMENT USING  29 MM CARBOMEDICS OPTIFORM MECHANICAL BILEAFLET PROSTHETIC HEART VALVE;  Surgeon: Rexene Alberts, MD;  Location: [place];  Service: Open Heart Surgery;  Laterality: N/A;  . MULTIPLE EXTRACTIONS WITH ALVEOLOPLASTY N/A 03/30/2016   Procedure: EXTRACTION OF TOOTH #'S 2,5,6,11,15,16,20-30 AND 32 WITH ALVEOLOPLASTY AND BILATERAL MAXILLARY LATERAL EXOSTOSES REDUCTIONS;  Surgeon: Lenn Cal, DDS;  Location: [place];  Service: Oral Surgery;  Laterality: N/A;  . PATCH ANGIOPLASTY Right 03/10/2016   Procedure: PATCH ANGIOPLASTY CAROTID RIGHT USING Rueben Bash BIOLOGIC PATCH;  Surgeon: Serafina Mitchell, MD;  Location: [place];  Service: Vascular;  Laterality: Right;  . TEE WITHOUT CARDIOVERSION N/A 04/10/2013   Procedure: TRANSESOPHAGEAL ECHOCARDIOGRAM (TEE);  Surgeon: [name], MD;  Location: [facility];  Service: Cardiovascular;  Laterality: N/A;  . TEE WITHOUT CARDIOVERSION N/A 03/24/2016   Procedure: TRANSESOPHAGEAL ECHOCARDIOGRAM (TEE);  Surgeon: Jerline Pain, MD;  Location: [place];  Service: Cardiovascular;  Laterality: N/A;  . TEE WITHOUT CARDIOVERSION N/A 05/28/2016   Procedure: TRANSESOPHAGEAL ECHOCARDIOGRAM (TEE);  Surgeon: Rexene Alberts, MD;  Location: [place];  Service: Open Heart Surgery;  Laterality: N/A;  . TUBAL LIGATION      Social History Marie Malone  reports that she quit smoking about 43 years ago. Her smoking use included cigarettes. She started smoking about 48 years ago. She has a 5.00 pack-year smoking history. She has never used smokeless tobacco. She reports that she does not drink alcohol or use drugs.  family history includes Cancer in her father; Diabetes in her brother, father, mother, and sister; Heart disease in her father; Hypertension in her daughter; Stroke in her mother.  Allergies  Allergen Reactions  . No Known Allergies        PHYSICAL EXAMINATION: Vital signs: BP 112/64   Pulse 75   Temp 97.9 F (36.6 C)   Ht 5\' 2"  (1.575 m)   Wt 138 lb 3.2  oz (62.7 kg)   BMI 25.28 kg/m   Constitutional: Pleasant, chronically ill-appearing, no acute distress Psychiatric: alert and oriented x3, cooperative Eyes: extraocular movements intact, anicteric, conjunctiva pink Mouth: oral pharynx moist, no lesions Neck: supple no lymphadenopathy Cardiovascular: heart regular rate and rhythm, no murmur Lungs: clear to auscultation bilaterally Abdomen: soft, nontender, nondistended, no obvious ascites, no peritoneal signs, normal bowel sounds, no organomegaly Rectal: Omitted Extremities: no clubbing or cyanosis.  Brawny lower extremity edema bilaterally Skin: no lesions on visible extremities Neuro: No focal deficits. No asterixis.    ASSESSMENT:  1.  Primary biliary cholangitis.  Compensated. 2.  EGD negative for varices 3.  Elevated liver test.  Improved on ursodeoxycholic acid 3149 mg daily 4.  Multiple medical problems  PLAN:  1.  Continue ursodeoxycholic acid 702 mg twice daily.  Refilled.  Medication effects and side effects reviewed 2.  Routine GI office follow-up 1 year.  Repeat laboratories at that time. 3.  Resume general medical care with primary provider and other specialists  25-minute spent face-to-face with the patient.  Greater than 50% the time  used for counseling regarding her chronic liver disease, its current state, work-up to date, current management, and follow-up plans.

## 2018-08-01 ENCOUNTER — Telehealth (INDEPENDENT_AMBULATORY_CARE_PROVIDER_SITE_OTHER): Payer: Medicare Other | Admitting: Family

## 2018-08-01 DIAGNOSIS — R600 Localized edema: Secondary | ICD-10-CM | POA: Diagnosis not present

## 2018-08-01 DIAGNOSIS — L299 Pruritus, unspecified: Secondary | ICD-10-CM | POA: Diagnosis not present

## 2018-08-01 MED ORDER — BETAMETHASONE DIPROPIONATE 0.05 % EX CREA: TOPICAL_CREAM | Freq: Two times a day (BID) | CUTANEOUS | 1 refills | Status: DC

## 2018-08-01 NOTE — Progress Notes (Signed)
Virtual Visit via Telephone Note  I connected with Marie Malone on 08/01/18 at  1:40 PM EDT by telephone and verified that I am speaking with the correct person using two identifiers.   I discussed the limitations, risks, security and privacy concerns of performing an evaluation and management service by telephone and the availability of in person appointments. I also discussed with the patient that there may be a patient responsible charge related to this service. The patient expressed understanding and agreed to proceed.   History of Present Illness:   Patient is a 68 year old female who presents today for follow-up via virtual telephone visit.  Lower extremity swelling-last visit we suggested that she purchase some compression stockings and also to increase her furosemide from 40 mg once daily in the morning to 40 mg in the morning and 20 mg in the evening.  She reports that her current weight is 135.6 pounds which is down from 138 pounds on our scale last visit.  She reports that her current blood pressure is 155/75 and her pulse is 64.  She reports improved swelling and decreased pain in both of her lower extremities.    Pruritus-she reports significant improvement in the itching with Diprolene cream.   Observations/Objective:  Patient is awake and alert, no obvious distress noted on the telephone. Assessment and Plan:  Lower extremity edema-this is improved on current Lasix regimen.  We will continue current medication.  I would like to check a follow-up basic metabolic panel which I hope to do next week when we have our outdoor lab draw station open.  Pruritus-she reports significant improvement with Diprolene requests a refill.  Refill will be provided however I advised the patient to use sparingly and to focus on emollients for long-term use.  Patient verbalizes understanding. Follow Up Instructions:    I discussed the assessment and treatment plan with the patient. The  patient was provided an opportunity to ask questions and all were answered. The patient agreed with the plan and demonstrated an understanding of the instructions.   The patient was advised to call back or seek an in-person evaluation if the symptoms worsen or if the condition fails to improve as anticipated.  I provided 11 minutes of non-face-to-face time during this encounter.   Nance Pear, NP

## 2018-08-07 ENCOUNTER — Other Ambulatory Visit: Payer: Self-pay | Admitting: Pharmacist

## 2018-08-07 ENCOUNTER — Other Ambulatory Visit: Payer: Self-pay | Admitting: Cardiovascular Disease

## 2018-08-07 DIAGNOSIS — I4891 Unspecified atrial fibrillation: Secondary | ICD-10-CM

## 2018-08-07 DIAGNOSIS — I480 Paroxysmal atrial fibrillation: Secondary | ICD-10-CM

## 2018-08-07 MED ORDER — WARFARIN SODIUM 4 MG PO TABS: ORAL_TABLET | ORAL | 0 refills | Status: DC

## 2018-08-09 ENCOUNTER — Telehealth: Payer: Self-pay | Admitting: Family

## 2018-08-09 DIAGNOSIS — R609 Edema, unspecified: Secondary | ICD-10-CM

## 2018-08-09 NOTE — Telephone Encounter (Signed)
Could you please schedule pt a lab visit?

## 2018-08-11 NOTE — Telephone Encounter (Signed)
Noted  

## 2018-08-14 ENCOUNTER — Other Ambulatory Visit: Payer: Self-pay

## 2018-08-14 ENCOUNTER — Other Ambulatory Visit (INDEPENDENT_AMBULATORY_CARE_PROVIDER_SITE_OTHER): Payer: Medicare Other

## 2018-08-14 DIAGNOSIS — R609 Edema, unspecified: Secondary | ICD-10-CM | POA: Diagnosis not present

## 2018-08-15 ENCOUNTER — Telehealth: Payer: Self-pay | Admitting: Family

## 2018-08-15 DIAGNOSIS — R779 Abnormality of plasma protein, unspecified: Secondary | ICD-10-CM

## 2018-08-15 LAB — COMPREHENSIVE METABOLIC PANEL
ALT: 23 U/L (ref 0–35)
AST: 46 U/L — ABNORMAL HIGH (ref 0–37)
Albumin: 4 g/dL (ref 3.5–5.2)
Alkaline Phosphatase: 77 U/L (ref 39–117)
BUN: 25 mg/dL — ABNORMAL HIGH (ref 6–23)
CO2: 26 mEq/L (ref 19–32)
Calcium: 9.5 mg/dL (ref 8.4–10.5)
Chloride: 102 mEq/L (ref 96–112)
Creatinine, Ser: 1.03 mg/dL (ref 0.40–1.20)
GFR: 64.74 mL/min (ref >60.00)
Glucose, Bld: 55 mg/dL — ABNORMAL LOW (ref 70–99)
Potassium: 4.5 mEq/L (ref 3.5–5.1)
Sodium: 137 mEq/L (ref 135–145)
Total Bilirubin: 1.4 mg/dL — ABNORMAL HIGH (ref 0.2–1.2)
Total Protein: 9.3 g/dL — ABNORMAL HIGH (ref 6.0–8.3)

## 2018-08-15 NOTE — Telephone Encounter (Signed)
Results given to patient, she will like to call us back in about 3 weeks to schedule lab appointment.

## 2018-08-15 NOTE — Telephone Encounter (Signed)
Please advise pt that her bilirubin is up some (one of the liver tests). I will forward this to Dr. Henrene Pastor but it may explain why she has had more itching recently.  Also, her blood protein is high.  I would like to bring her back to the lab please to do some additional testing.  Please schedule her for a lab visit in a few weeks.

## 2018-08-23 ENCOUNTER — Telehealth: Payer: Self-pay

## 2018-08-23 NOTE — Telephone Encounter (Signed)

## 2018-08-23 NOTE — Telephone Encounter (Signed)
lmom for prescreen  

## 2018-08-24 ENCOUNTER — Ambulatory Visit (INDEPENDENT_AMBULATORY_CARE_PROVIDER_SITE_OTHER): Payer: Medicare Other | Admitting: *Deleted

## 2018-08-24 ENCOUNTER — Other Ambulatory Visit: Payer: Self-pay

## 2018-08-24 DIAGNOSIS — Z5181 Encounter for therapeutic drug level monitoring: Secondary | ICD-10-CM

## 2018-08-24 DIAGNOSIS — I480 Paroxysmal atrial fibrillation: Secondary | ICD-10-CM | POA: Diagnosis not present

## 2018-08-24 DIAGNOSIS — I4891 Unspecified atrial fibrillation: Secondary | ICD-10-CM

## 2018-08-24 LAB — POCT INR: INR: 2.4 (ref 2.0–3.0)

## 2018-08-24 NOTE — Patient Instructions (Signed)
Description   Spoke with pt and instructed pt continue taking 1 tablet daily.  Repeat INR in 4 weeks

## 2018-09-04 ENCOUNTER — Other Ambulatory Visit: Payer: Self-pay

## 2018-09-04 ENCOUNTER — Other Ambulatory Visit (INDEPENDENT_AMBULATORY_CARE_PROVIDER_SITE_OTHER): Payer: Medicare Other

## 2018-09-04 DIAGNOSIS — R779 Abnormality of plasma protein, unspecified: Secondary | ICD-10-CM | POA: Diagnosis not present

## 2018-09-06 LAB — PROTEIN ELECTROPHORESIS, SERUM, WITH REFLEX
Albumin ELP: 3.9 g/dL (ref 3.8–4.8)
Alpha 1: 0.3 g/dL (ref 0.2–0.3)
Alpha 2: 0.7 g/dL (ref 0.5–0.9)
Beta 2: 1.2 g/dL — ABNORMAL HIGH (ref 0.2–0.5)
Beta Globulin: 0.6 g/dL (ref 0.4–0.6)
Gamma Globulin: 2.5 g/dL — ABNORMAL HIGH (ref 0.8–1.7)
Total Protein: 9.2 g/dL — ABNORMAL HIGH (ref 6.1–8.1)

## 2018-09-10 ENCOUNTER — Telehealth: Payer: Self-pay | Admitting: Family

## 2018-09-10 NOTE — Telephone Encounter (Signed)
-----   Message from Eliezer Bottom, NP sent at 09/06/2018 12:19 PM EDT ----- We are all well. Hope you are too! I showed the SPEP to Dr. Marin Olp. There were no abnormal protein bands noted. He felt the elevation in total protein, beta 2 and Gamma Glbulin was because of polyclonal antibodies due to chronic inflammation and not a malignancy. No referral needed. Thanks a bunch! Stay safe my friend!  Sarah ----- Message ----- From: Debbrah Alar, NP Sent: 09/06/2018  11:41 AM EDT To: Eliezer Bottom, NP  Hi Sarah,   [facility] you and your family are well!  Do you mind glancing at her SPEP please? Wasn't sure if the abnormalities warrant consult or not.  Just let me know and if appropriate I will place the referral.  Thanks, Kaenan Jake

## 2018-09-20 ENCOUNTER — Telehealth: Payer: Self-pay

## 2018-09-20 NOTE — Telephone Encounter (Signed)
lmom for prescreen  

## 2018-09-20 NOTE — Telephone Encounter (Signed)

## 2018-09-21 ENCOUNTER — Other Ambulatory Visit: Payer: Self-pay

## 2018-09-21 ENCOUNTER — Ambulatory Visit (INDEPENDENT_AMBULATORY_CARE_PROVIDER_SITE_OTHER): Payer: Medicare Other | Admitting: *Deleted

## 2018-09-21 DIAGNOSIS — I4891 Unspecified atrial fibrillation: Secondary | ICD-10-CM

## 2018-09-21 DIAGNOSIS — I480 Paroxysmal atrial fibrillation: Secondary | ICD-10-CM

## 2018-09-21 DIAGNOSIS — Z5181 Encounter for therapeutic drug level monitoring: Secondary | ICD-10-CM

## 2018-09-21 LAB — POCT INR: INR: 2.3 (ref 2.0–3.0)

## 2018-09-21 NOTE — Patient Instructions (Signed)
Description   Spoke with pt and instructed pt continue taking 1 tablet daily.  Repeat INR in 6 weeks at [facility] location.

## 2018-10-25 ENCOUNTER — Other Ambulatory Visit: Payer: Self-pay

## 2018-10-25 DIAGNOSIS — I10 Essential (primary) hypertension: Secondary | ICD-10-CM

## 2018-10-25 MED ORDER — LOSARTAN POTASSIUM 25 MG PO TABS: ORAL_TABLET | ORAL | 1 refills | Status: AC

## 2018-10-26 ENCOUNTER — Telehealth: Payer: Self-pay

## 2018-10-26 NOTE — Telephone Encounter (Signed)

## 2018-11-02 ENCOUNTER — Ambulatory Visit (INDEPENDENT_AMBULATORY_CARE_PROVIDER_SITE_OTHER): Payer: Medicare Other | Admitting: Pharmacist

## 2018-11-02 ENCOUNTER — Other Ambulatory Visit: Payer: Self-pay

## 2018-11-02 DIAGNOSIS — I480 Paroxysmal atrial fibrillation: Secondary | ICD-10-CM | POA: Diagnosis not present

## 2018-11-02 DIAGNOSIS — Z5181 Encounter for therapeutic drug level monitoring: Secondary | ICD-10-CM

## 2018-11-02 DIAGNOSIS — I4891 Unspecified atrial fibrillation: Secondary | ICD-10-CM

## 2018-11-02 LAB — POCT INR: INR: 2.8 (ref 2.0–3.0)

## 2018-11-13 ENCOUNTER — Ambulatory Visit: Payer: Self-pay | Admitting: Family

## 2018-11-13 NOTE — Progress Notes (Signed)
Virtual Visit via Video Note   This visit type was conducted due to national recommendations for restrictions regarding the COVID-19 Pandemic (e.g. social distancing) in an effort to limit this patient's exposure and mitigate transmission in our community.  Due to her co-morbid illnesses, this patient is at least at moderate risk for complications without adequate follow up.  This format is felt to be most appropriate for this patient at this time.  All issues noted in this document were discussed and addressed.  A limited physical exam was performed with this format.  Please refer to the patient's chart for her consent to telehealth for [facility].   Date:  11/15/2018   ID:  Marie Malone, Marie Malone [birth year], MRN [id number]  Patient Location:Home Provider Location: Home  PCP:  Debbrah Alar, NP  Cardiologist:  Dr [name]  Evaluation Performed:  Follow-Up Visit  Chief Complaint:  FU MVR and atrial fibrillation.   History of Present Illness:    FU MVRand atrial fibrillation. Patient has a history of rheumatic fever/MS. Patient had valvuloplasty at [facility] in 2008. Prior ILRrevealed paroxysmal atrial fibrillation and anticoagulation was initiated. Patient had carotid endarterectomy November 2017. Admitted with syncope that same month. Had Mobitz type I second-degree AV block when in sinus rhythm but rate controlled when in atrial fibrillation. TEE November 2017 showed normal LV function, moderate to severe mitral stenosis, mild left atrial enlargement. Cardiac catheterization November 2017 showed normal LV systolic function, moderate mitral stenosis with mean gradient 10 mmHg, 100% right coronary artery with left-to-right collaterals and moderate pulmonary hypertension. Pt had mechanical MVR, CABG (SVG to PDA) and Maze 1/18. Echocardiogram April 2018 showed ejection fraction 45-50%, mechanical mitral valve, moderate left atrial enlargement, severe right atrial enlargement, mild right  ventricular enlargement. ABIs May 2019 normal but right and left toe brachial index abnormal. Carotid Dopplers November 2019 showed 1 to 39% bilateral stenosis.  Since last seen,the patient has dyspnea with more extreme activities but not with routine activities. It is relieved with rest. It is not associated with chest pain. There is no orthopnea, PND or pedal edema. There is no syncope or palpitations. There is no exertional chest pain.   The patient does not have symptoms concerning for COVID-19 infection (fever, chills, cough, or new shortness of breath).    Past Medical History:  Diagnosis Date  . Atrial fibrillation ([place])   . Blood transfusion without reported diagnosis 05/2016  . Chronic diastolic CHF (congestive heart failure) ([place])   . Clotting disorder ([place]) 2014   placed on Warfarin  . Complication of anesthesia    difficult to wake up from anesthesia  . Coronary artery disease involving native coronary artery without angina pectoris 03/26/2016   100% chronic occlusion of RCA  . Dyspnea   . Heart murmur   . History of rheumatic fever    as a child  . Hyperlipidemia   . Hypertension   . Mitral stenosis   . OSA (obstructive sleep apnea)    mild per sleep study 2008  . Paroxysmal atrial fibrillation (HCC)   . Pneumonia    double pneumonia once  . Pulmonary hypertension ([place])   . Rheumatic mitral regurgitation   . S/P balloon mitral valvuloplasty 2008   . 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 S/P mitral valve replacement with mechanical valve 05/28/2016   29 mm Sorin Carbomedics Optiform bileaflet mechanical prosthetic valve  . Stroke [facility])  x2   Past Surgical History:  Procedure Laterality Date  . APPENDECTOMY  1989   appendix ruptured,had peritonitis  . BALLOON VALVULOPLASTY  2008   [facility]  . CARDIAC CATHETERIZATION N/A 03/25/2016   Procedure: Right/Left Heart Cath and Coronary Angiography;  Surgeon: Jettie Booze, MD;  Location:  [place] CV LAB;  Service: Cardiovascular;  Laterality: N/A;  . COLONOSCOPY W/ POLYPECTOMY    . CORONARY ARTERY BYPASS GRAFT N/A 05/28/2016   Procedure: CORONARY ARTERY BYPASS GRAFTING (CABG) x 1 WITH ENDOSCOPIC HARVESTING OF RIGHT SAPHENOUS VEIN, EVH- PDA;  Surgeon: Rexene Alberts, MD;  Location: [place];  Service: Open Heart Surgery;  Laterality: N/A;  . ENDARTERECTOMY Right 03/10/2016   Procedure: ENDARTERECTOMY CAROTID RIGHT;  Surgeon: Serafina Mitchell, MD;  Location: [place];  Service: Vascular;  Laterality: Right;  . EP IMPLANTABLE DEVICE N/A 03/11/2016   Procedure: Loop Recorder Removal;  Surgeon: Deboraha Sprang, MD;  Location: [place] CV LAB;  Service: Cardiovascular;  Laterality: N/A;  . 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 LOOP RECORDER IMPLANT N/A 04/10/2013   Procedure: LOOP RECORDER IMPLANT;  Surgeon: Coralyn Mark, MD;  Location: [place] CATH LAB;  Service: Cardiovascular;  Laterality: N/A;  . MAZE N/A 05/28/2016   Procedure: MAZE PROCEDURE AND APPLICATION OF  ATRICLIP LAA PROCLIP II 22 MM;  Surgeon: Rexene Alberts, MD;  Location: [place];  Service: Open Heart Surgery;  Laterality: N/A;  . MITRAL VALVE REPLACEMENT N/A 05/28/2016   Procedure: MITRAL VALVE (MV) REPLACEMENT USING 29 MM CARBOMEDICS OPTIFORM MECHANICAL BILEAFLET PROSTHETIC HEART VALVE;  Surgeon: Rexene Alberts, MD;  Location: [place];  Service: Open Heart Surgery;  Laterality: N/A;  . MULTIPLE EXTRACTIONS WITH ALVEOLOPLASTY N/A 03/30/2016   Procedure: EXTRACTION OF TOOTH #'S 2,5,6,11,15,16,20-30 AND 32 WITH ALVEOLOPLASTY AND BILATERAL MAXILLARY LATERAL EXOSTOSES REDUCTIONS;  Surgeon: Lenn Cal, DDS;  Location: [place];  Service: Oral Surgery;  Laterality: N/A;  . PATCH ANGIOPLASTY Right 03/10/2016   Procedure: PATCH ANGIOPLASTY CAROTID RIGHT USING Rueben Bash BIOLOGIC PATCH;  Surgeon: Serafina Mitchell, MD;  Location: [place];  Service: Vascular;  Laterality: Right;  . TEE  WITHOUT CARDIOVERSION N/A 04/10/2013   Procedure: TRANSESOPHAGEAL ECHOCARDIOGRAM (TEE);  Surgeon: [name], MD;  Location: [facility] ENDOSCOPY;  Service: Cardiovascular;  Laterality: N/A;  . TEE WITHOUT CARDIOVERSION N/A 03/24/2016   Procedure: TRANSESOPHAGEAL ECHOCARDIOGRAM (TEE);  Surgeon: Jerline Pain, MD;  Location: [place];  Service: Cardiovascular;  Laterality: N/A;  . TEE WITHOUT CARDIOVERSION N/A 05/28/2016   Procedure: TRANSESOPHAGEAL ECHOCARDIOGRAM (TEE);  Surgeon: Rexene Alberts, MD;  Location: [place];  Service: Open Heart Surgery;  Laterality: N/A;  . TUBAL LIGATION       Current Meds  Medication Sig  . aspirin EC 81 MG EC tablet Take 1 tablet (81 mg total) by mouth daily.  [name] Kitchen atorvastatin (LIPITOR) 80 MG tablet Take 0.5 tablets (40 mg total) by mouth daily at 6 PM.  . betamethasone dipropionate (DIPROLENE) 0.05 % cream Apply topically 2 (two) times daily.  . diphenhydrAMINE (BENADRYL) 25 mg capsule Take 25 mg by mouth at bedtime as needed for itching.  . furosemide (LASIX) 20 MG tablet Take 2 tabs by mouth in the AM and 1 tab by mouth in the PM (Patient taking differently: Take 40 mg by mouth daily. Take 2 tabs by mouth in the AM and 1 tab by mouth in the PM)  .  loratadine (CLARITIN) 10 MG tablet Take 10 mg by mouth daily.  [name] Kitchen losartan (COZAAR) 25 MG tablet TAKE 1 TABLET BY MOUTH ONCE DAILY *NEED OFFICE VISIT*  . metoprolol succinate (TOPROL-XL) 50 MG 24 hr tablet TAKE 1 TABLET BY MOUTH ONCE DAILY OR IMMEDIATELY FOLLOWING MEALS *NEED OFFICE VISIT*  . potassium chloride (K-DUR) 10 MEQ tablet Take 1 tablet (10 mEq total) by mouth 2 (two) times daily. Take 1 tab daily and a 2nd tab daily prn when you take a 2nd Furosemide tab in a day  . ursodiol (ACTIGALL) 500 MG tablet Take 1 tablet (500 mg total) by mouth 2 (two) times daily.  [name] Kitchen warfarin (COUMADIN) 4 MG tablet TAKE 1 TABLET BY MOUTH ONCE DAILY OR AS DIRECTED BY COUMADIN CLINIC     Allergies:   No known allergies   Social  History   Tobacco Use  . Smoking status: Former Smoker    Packs/day: 1.00    Years: 5.00    Pack years: 5.00    Types: Cigarettes    Start date: 07/15/1970    Quit date: 05/11/1975    Years since quitting: 43.5  . Smokeless tobacco: Never Used  . Tobacco comment: quit smoking 36 years ago  Substance Use Topics  . Alcohol use: No    Alcohol/week: 0.0 standard drinks  . Drug use: No     Family Hx: The patient's family history includes Cancer in her father; Diabetes in her brother, father, mother, and sister; Heart disease in her father; Hypertension in her daughter; Stroke in her mother. There is no history of Colon polyps, Colon cancer, Esophageal cancer, Rectal cancer, or Stomach cancer.  ROS:   Please see the history of present illness.    No Fever, chills  or productive cough All other systems reviewed and are negative.  Recent Labs: 08/14/2018: ALT 23; BUN 25; Creatinine, Ser 1.03; Potassium 4.5; Sodium 137   Recent Lipid Panel Lab Results  Component Value Date/Time   CHOL 107 03/28/2017 11:10 AM   TRIG 53 03/28/2017 11:10 AM   HDL 61 03/28/2017 11:10 AM   CHOLHDL 1.8 03/28/2017 11:10 AM   CHOLHDL 2 07/07/2016 12:31 PM   LDLCALC 35 03/28/2017 11:10 AM    Wt Readings from Last 3 Encounters:  11/15/18 136 lb 6.4 oz (61.9 kg)  11/14/18 138 lb (62.6 kg)  07/26/18 138 lb 3.2 oz (62.7 kg)     Objective:    Vital Signs:  Ht 5\' 2"  (1.575 m)   Wt 136 lb 6.4 oz (61.9 kg)   BMI 24.95 kg/m    VITAL SIGNS:  reviewed NAD Answers questions appropriately Normal affect Remainder of physical examination not performed (telehealth visit; coronavirus pandemic)  ASSESSMENT & PLAN:    1. Prior mitral valve replacement-Continue coumadin and ASA; continue SBE prophylaxis. 2. PAF/flutter-continue metoprolol and coumadin. 3. Hypertension-BP controlled; continue present meds and follow. 4. Hyperlipidemia-continue statin. 5. Coronary artery disease-patient is not having chest pain.   Continue statin. 6. Carotid artery disease-follow-up carotid Dopplers November 2020.  COVID-19 Education: The importance of social distancing was discussed today.  Time:   Today, I have spent 17 minutes with the patient with telehealth technology discussing the above problems.     Medication Adjustments/Labs and Tests Ordered: Current medicines are reviewed at length with the patient today.  Concerns regarding medicines are outlined above.   Tests Ordered: No orders of the defined types were placed in this encounter.   Medication Changes: No orders of the defined  types were placed in this encounter.   Follow Up:  Virtual Visit or In Person in 6 month(s)  Signed, [name], MD  11/15/2018 11:28 AM    [place]

## 2018-11-14 ENCOUNTER — Ambulatory Visit: Payer: Medicare Other | Admitting: Family

## 2018-11-14 ENCOUNTER — Encounter: Payer: Self-pay | Admitting: Family

## 2018-11-14 ENCOUNTER — Other Ambulatory Visit: Payer: Self-pay

## 2018-11-14 VITALS — BP 127/55 | HR 63 | Temp 99.3°F | Resp 16 | Ht 62.0 in | Wt 138.0 lb

## 2018-11-14 DIAGNOSIS — R21 Rash and other nonspecific skin eruption: Secondary | ICD-10-CM

## 2018-11-14 DIAGNOSIS — R5383 Other fatigue: Secondary | ICD-10-CM

## 2018-11-14 NOTE — Patient Instructions (Addendum)
Please complete lab work prior to leaving.  You should be contacted about scheduling your appointment with dermatology.  Please keep your appointment tomorrow with cardiology.

## 2018-11-15 ENCOUNTER — Encounter: Payer: Self-pay | Admitting: Cardiology

## 2018-11-15 ENCOUNTER — Telehealth (INDEPENDENT_AMBULATORY_CARE_PROVIDER_SITE_OTHER): Payer: Medicare Other | Admitting: Cardiology

## 2018-11-15 VITALS — Ht 62.0 in | Wt 136.4 lb

## 2018-11-15 DIAGNOSIS — I679 Cerebrovascular disease, unspecified: Secondary | ICD-10-CM | POA: Diagnosis not present

## 2018-11-15 DIAGNOSIS — I10 Essential (primary) hypertension: Secondary | ICD-10-CM | POA: Diagnosis not present

## 2018-11-15 DIAGNOSIS — Z954 Presence of other heart-valve replacement: Secondary | ICD-10-CM

## 2018-11-15 DIAGNOSIS — I480 Paroxysmal atrial fibrillation: Secondary | ICD-10-CM | POA: Diagnosis not present

## 2018-11-15 LAB — COMPREHENSIVE METABOLIC PANEL
ALT: 19 U/L (ref 0–35)
AST: 42 U/L — ABNORMAL HIGH (ref 0–37)
Albumin: 3.8 g/dL (ref 3.5–5.2)
Alkaline Phosphatase: 69 U/L (ref 39–117)
BUN: 22 mg/dL (ref 6–23)
CO2: 24 mEq/L (ref 19–32)
Calcium: 9.3 mg/dL (ref 8.4–10.5)
Chloride: 106 mEq/L (ref 96–112)
Creatinine, Ser: 1.06 mg/dL (ref 0.40–1.20)
GFR: 62.59 mL/min (ref >60.00)
Glucose, Bld: 66 mg/dL — ABNORMAL LOW (ref 70–99)
Potassium: 4.2 mEq/L (ref 3.5–5.1)
Sodium: 139 mEq/L (ref 135–145)
Total Bilirubin: 1.5 mg/dL — ABNORMAL HIGH (ref 0.2–1.2)
Total Protein: 9.3 g/dL — ABNORMAL HIGH (ref 6.0–8.3)

## 2018-11-15 LAB — CBC WITH DIFFERENTIAL/PLATELET
Basophils Absolute: 0 10*3/uL (ref 0.0–0.1)
Basophils Relative: 1.1 % (ref 0.0–3.0)
Eosinophils Absolute: 0.2 10*3/uL (ref 0.0–0.7)
Eosinophils Relative: 4.8 % (ref 0.0–5.0)
HCT: 33.8 % — ABNORMAL LOW (ref 36.0–46.0)
Hemoglobin: 10.8 g/dL — ABNORMAL LOW (ref 12.0–15.0)
Lymphocytes Relative: 16.6 % (ref 12.0–46.0)
Lymphs Abs: 0.6 10*3/uL — ABNORMAL LOW (ref 0.7–4.0)
MCHC: 31.9 g/dL (ref 30.0–36.0)
MCV: 92.6 fl (ref 78.0–100.0)
Monocytes Absolute: 0.6 10*3/uL (ref 0.1–1.0)
Monocytes Relative: 17.5 % — ABNORMAL HIGH (ref 3.0–12.0)
Neutro Abs: 2.1 10*3/uL (ref 1.4–7.7)
Neutrophils Relative %: 60 % (ref 43.0–77.0)
Platelets: 88 10*3/uL — ABNORMAL LOW (ref 150.0–400.0)
RBC: 3.65 Mil/uL — ABNORMAL LOW (ref 3.87–5.11)
RDW: 16.4 % — ABNORMAL HIGH (ref 11.5–15.5)
WBC: 3.5 10*3/uL — ABNORMAL LOW (ref 4.0–10.5)

## 2018-11-15 LAB — TSH: TSH: 3.57 u[IU]/mL (ref 0.35–4.50)

## 2018-11-15 NOTE — Patient Instructions (Signed)
Medication Instructions:  NO CHANGE If you need a refill on your cardiac medications before your next appointment, please call your pharmacy.   Lab work: If you have labs (blood work) drawn today and your tests are completely normal, you will receive your results only by: [name] Kitchen MyChart Message (if you have MyChart) OR . A paper copy in the mail If you have any lab test that is abnormal or we need to change your treatment, we will call you to review the results.  Follow-Up: At [facility], you and your health needs are our priority.  As part of our continuing mission to provide you with exceptional heart care, we have created designated Provider Care Teams.  These Care Teams include your primary Cardiologist (physician) and Advanced Practice Providers (APPs -  Physician Assistants and Nurse Practitioners) who all work together to provide you with the care you need, when you need it. You will need a follow up appointment in 6 months.  Please call our office 2 months in advance to schedule this appointment.  You will see [name] MD

## 2018-11-16 ENCOUNTER — Other Ambulatory Visit: Payer: Self-pay | Admitting: Cardiology

## 2018-11-16 DIAGNOSIS — I4891 Unspecified atrial fibrillation: Secondary | ICD-10-CM

## 2018-11-16 DIAGNOSIS — I480 Paroxysmal atrial fibrillation: Secondary | ICD-10-CM

## 2018-11-17 NOTE — Progress Notes (Signed)
Patient is a 68 yr old female who presents today for follow up.  Hyperlipidemia-  Recent Labs       Lab Results  Component Value Date   CHOL 107 03/28/2017   HDL 61 03/28/2017   LDLCALC 35 03/28/2017   TRIG 53 03/28/2017   CHOLHDL 1.8 03/28/2017  Eczema- "itching is getting worse." Reports that she I staking claritin in the AM and Benadryl at night.  Fatigue- reports generalized fatigue. Reports some DOE when she "walks up the incline" near her house.  Review of Systems  see HPI      Past Medical History:  Diagnosis Date  . Atrial fibrillation ([place])   . Blood transfusion without reported diagnosis 05/2016  . Chronic diastolic CHF (congestive heart failure) ([place])   . Clotting disorder ([place]) 2014   placed on Warfarin  . Complication of anesthesia    difficult to wake up from anesthesia  . Coronary artery disease involving native coronary artery without angina pectoris 03/26/2016   100% chronic occlusion of RCA  . Dyspnea   . Heart murmur   . History of rheumatic fever    as a child  . Hyperlipidemia   . Hypertension   . Mitral stenosis   . OSA (obstructive sleep apnea)    mild per sleep study 2008  . Paroxysmal atrial fibrillation (HCC)   . Pneumonia    double pneumonia once  . Pulmonary hypertension ([place])   . Rheumatic mitral regurgitation   . S/P balloon mitral valvuloplasty 2008   . 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 S/P mitral valve replacement with mechanical valve 05/28/2016   29 mm Sorin Carbomedics Optiform bileaflet mechanical prosthetic valve  . Stroke [facility])    x2   Social History        Socioeconomic History  . Marital status: Married    Spouse name: Not on file  . Number of children: 2  . Years of education: Associates  . Highest education level: Not on file  Occupational History  . Not on file  Social Needs  . Financial resource strain: Not on file  . Food insecurity    Worry: Not on file    Inability: Not on file   . Transportation needs    Medical: Not on file    Non-medical: Not on file  Tobacco Use  . Smoking status: Former Smoker    Packs/day: 1.00    Years: 5.00    Pack years: 5.00    Types: Cigarettes    Start date: 07/15/1970    Quit date: 05/11/1975    Years since quitting: 43.5  . Smokeless tobacco: Never Used  . Tobacco comment: quit smoking 36 years ago  Substance and Sexual Activity  . Alcohol use: No    Alcohol/week: 0.0 standard drinks  . Drug use: No  . Sexual activity: Not on file  Lifestyle  . Physical activity    Days per week: Not on file    Minutes per session: Not on file  . Stress: Not on file  Relationships  . Social Herbalist on phone: Not on file    Gets together: Not on file    Attends religious service: Not on file    Active member of club or organization: Not on file    Attends meetings of clubs or organizations: Not on file    Relationship status: Not on file  . Intimate  partner violence    Fear of current or ex partner: Not on file    Emotionally abused: Not on file    Physically abused: Not on file    Forced sexual activity: Not on file  Other Topics Concern  . Not on file  Social History Narrative   Retired Archivist   Married   She has 2 grown children-   Daughter lives in [place]   Son lives in [place]   6 grandchildren         Past Surgical History:  Procedure Laterality Date  . APPENDECTOMY  1989   appendix ruptured,had peritonitis  . BALLOON VALVULOPLASTY  2008   [facility]  . CARDIAC CATHETERIZATION N/A 03/25/2016   Procedure: Right/Left Heart Cath and Coronary Angiography; Surgeon: Jettie Booze, MD; Location: [place] CV LAB; Service: Cardiovascular; Laterality: N/A;  . COLONOSCOPY W/ POLYPECTOMY    . CORONARY ARTERY BYPASS GRAFT N/A 05/28/2016   Procedure: CORONARY ARTERY BYPASS GRAFTING (CABG) x 1 WITH ENDOSCOPIC HARVESTING OF RIGHT SAPHENOUS VEIN, EVH- PDA; Surgeon: Rexene Alberts, MD; Location: [place]; Service: Open Heart Surgery; Laterality: N/A;  . ENDARTERECTOMY Right 03/10/2016   Procedure: ENDARTERECTOMY CAROTID RIGHT; Surgeon: Serafina Mitchell, MD; Location: [place]; Service: Vascular; Laterality: Right;  . EP IMPLANTABLE DEVICE N/A 03/11/2016   Procedure: Loop Recorder Removal; Surgeon: Deboraha Sprang, MD; Location: [place] CV LAB; Service: Cardiovascular; Laterality: N/A;  . 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 LOOP RECORDER IMPLANT N/A 04/10/2013   Procedure: LOOP RECORDER IMPLANT; Surgeon: Coralyn Mark, MD; Location: [place] CATH LAB; Service: Cardiovascular; Laterality: N/A;  . MAZE N/A 05/28/2016   Procedure: MAZE PROCEDURE AND APPLICATION OF ATRICLIP LAA PROCLIP II 45 MM; Surgeon: Rexene Alberts, MD; Location: [place]; Service: Open Heart Surgery; Laterality: N/A;  . MITRAL VALVE REPLACEMENT N/A 05/28/2016   Procedure: MITRAL VALVE (MV) REPLACEMENT USING 29 MM CARBOMEDICS OPTIFORM MECHANICAL BILEAFLET PROSTHETIC HEART VALVE; Surgeon: Rexene Alberts, MD; Location: [place]; Service: Open Heart Surgery; Laterality: N/A;  . MULTIPLE EXTRACTIONS WITH ALVEOLOPLASTY N/A 03/30/2016   Procedure: EXTRACTION OF TOOTH #'S 2,5,6,11,15,16,20-30 AND 32 WITH ALVEOLOPLASTY AND BILATERAL MAXILLARY LATERAL EXOSTOSES REDUCTIONS; Surgeon: Lenn Cal, DDS; Location: [place]; Service: Oral Surgery; Laterality: N/A;  . PATCH ANGIOPLASTY Right 03/10/2016   Procedure: PATCH ANGIOPLASTY CAROTID RIGHT USING Rueben Bash BIOLOGIC PATCH; Surgeon: Serafina Mitchell, MD; Location: [place]; Service: Vascular; Laterality: Right;  . TEE WITHOUT CARDIOVERSION N/A 04/10/2013   Procedure: TRANSESOPHAGEAL ECHOCARDIOGRAM (TEE); Surgeon: [name], MD; Location: [facility] ENDOSCOPY; Service: Cardiovascular; Laterality: N/A;  . TEE WITHOUT CARDIOVERSION N/A 03/24/2016   Procedure: TRANSESOPHAGEAL ECHOCARDIOGRAM (TEE); Surgeon: Jerline Pain, MD; Location: [place]; Service:  Cardiovascular; Laterality: N/A;  . TEE WITHOUT CARDIOVERSION N/A 05/28/2016   Procedure: TRANSESOPHAGEAL ECHOCARDIOGRAM (TEE); Surgeon: Rexene Alberts, MD; Location: [place]; Service: Open Heart Surgery; Laterality: N/A;  . TUBAL LIGATION          Family History  Problem Relation Age of Onset  . Diabetes Mother   . Stroke Mother   . Diabetes Father   . Heart disease Father    CHF  . Cancer Father    kidney  . Diabetes Sister   . Diabetes Brother   . Hypertension Daughter   . Colon polyps Neg Hx   . Colon cancer Neg Hx   . Esophageal cancer Neg Hx   . Rectal cancer  Neg Hx   . Stomach cancer Neg Hx        Allergies  Allergen Reactions  . No Known Allergies          Current Outpatient Medications on File Prior to Visit  Medication Sig Dispense Refill  . aspirin EC 81 MG EC tablet Take 1 tablet (81 mg total) by mouth daily.    [name] Kitchen atorvastatin (LIPITOR) 80 MG tablet Take 0.5 tablets (40 mg total) by mouth daily at 6 PM. 45 tablet 1  . betamethasone dipropionate (DIPROLENE) 0.05 % cream Apply topically 2 (two) times daily. 45 g 1  . furosemide (LASIX) 20 MG tablet Take 2 tabs by mouth in the AM and 1 tab by mouth in the PM 90 tablet 3  . losartan (COZAAR) 25 MG tablet TAKE 1 TABLET BY MOUTH ONCE DAILY *NEED OFFICE VISIT* 90 tablet 1  . metoprolol succinate (TOPROL-XL) 50 MG 24 hr tablet TAKE 1 TABLET BY MOUTH ONCE DAILY OR IMMEDIATELY FOLLOWING MEALS *NEED OFFICE VISIT* 30 tablet 5  . potassium chloride (K-DUR) 10 MEQ tablet Take 1 tablet (10 mEq total) by mouth 2 (two) times daily. Take 1 tab daily and a 2nd tab daily prn when you take a 2nd Furosemide tab in a day 120 tablet 1  . ursodiol (ACTIGALL) 500 MG tablet Take 1 tablet (500 mg total) by mouth 2 (two) times daily. 60 tablet 11  . warfarin (COUMADIN) 4 MG tablet TAKE 1 TABLET BY MOUTH ONCE DAILY OR AS DIRECTED BY COUMADIN CLINIC 100 tablet 0   No current facility-administered medications on file prior to visit.   BP (!)  127/55 (BP Location: Right Arm, Patient Position: Sitting, Cuff Size: Small)  Pulse 63  Temp 99.3 F (37.4 C) (Oral)  Resp 16  Ht 5\' 2"  (1.575 m)  Wt 138 lb (62.6 kg)  SpO2 100%  BMI 25.24 kg/m  Objective:  Objective  Physical Exam  Constitutional:  Appearance: She is well-developed.  Neck:  Musculoskeletal: Neck supple.  Thyroid: No thyromegaly.  Cardiovascular:  Rate and Rhythm: Normal rate and regular rhythm.  Heart sounds: Normal heart sounds. No murmur.  Pulmonary:  Effort: Pulmonary effort is normal. No respiratory distress.  Breath sounds: Normal breath sounds. No wheezing.  Skin: Skin is warm and dry. Has some scabbed lesions on upper posterior back. Neurological:  Mental Status: She is alert and oriented to person, place, and time.  Psychiatric:  Behavior: Behavior normal.  Thought Content: Thought content normal.  Judgment: Judgment normal.    Assessment & Plan:   Skin rash- will refer to dermatology. It could be related to elevated bilirubin. Will check follow up LFT.   Fatigue- will obtain cmet, cbc, and TSH

## 2018-12-11 ENCOUNTER — Other Ambulatory Visit: Payer: Self-pay | Admitting: Family

## 2018-12-14 ENCOUNTER — Ambulatory Visit (INDEPENDENT_AMBULATORY_CARE_PROVIDER_SITE_OTHER): Payer: Medicare Other | Admitting: Pharmacist

## 2018-12-14 ENCOUNTER — Other Ambulatory Visit: Payer: Self-pay

## 2018-12-14 DIAGNOSIS — Z5181 Encounter for therapeutic drug level monitoring: Secondary | ICD-10-CM

## 2018-12-14 DIAGNOSIS — I4891 Unspecified atrial fibrillation: Secondary | ICD-10-CM

## 2018-12-14 DIAGNOSIS — I480 Paroxysmal atrial fibrillation: Secondary | ICD-10-CM

## 2018-12-14 LAB — POCT INR: INR: 2.3 (ref 2.0–3.0)

## 2019-01-05 ENCOUNTER — Ambulatory Visit (INDEPENDENT_AMBULATORY_CARE_PROVIDER_SITE_OTHER): Payer: Medicare Other

## 2019-01-05 ENCOUNTER — Other Ambulatory Visit: Payer: Self-pay

## 2019-01-05 DIAGNOSIS — Z23 Encounter for immunization: Secondary | ICD-10-CM

## 2019-01-10 ENCOUNTER — Other Ambulatory Visit: Payer: Self-pay | Admitting: Family

## 2019-01-23 ENCOUNTER — Inpatient Hospital Stay (HOSPITAL_COMMUNITY)
Admission: EM | Admit: 2019-01-23 | Discharge: 2019-03-11 | DRG: 291 | Disposition: E | Payer: Medicare Other | Attending: Internal Medicine | Admitting: Internal Medicine

## 2019-01-23 ENCOUNTER — Emergency Department (HOSPITAL_COMMUNITY): Payer: Medicare Other

## 2019-01-23 DIAGNOSIS — I739 Peripheral vascular disease, unspecified: Secondary | ICD-10-CM | POA: Diagnosis present

## 2019-01-23 DIAGNOSIS — K7460 Unspecified cirrhosis of liver: Secondary | ICD-10-CM

## 2019-01-23 DIAGNOSIS — I5043 Acute on chronic combined systolic (congestive) and diastolic (congestive) heart failure: Secondary | ICD-10-CM | POA: Diagnosis present

## 2019-01-23 DIAGNOSIS — Z7901 Long term (current) use of anticoagulants: Secondary | ICD-10-CM

## 2019-01-23 DIAGNOSIS — D62 Acute posthemorrhagic anemia: Secondary | ICD-10-CM | POA: Diagnosis present

## 2019-01-23 DIAGNOSIS — Z823 Family history of stroke: Secondary | ICD-10-CM

## 2019-01-23 DIAGNOSIS — J9601 Acute respiratory failure with hypoxia: Secondary | ICD-10-CM | POA: Diagnosis not present

## 2019-01-23 DIAGNOSIS — R509 Fever, unspecified: Secondary | ICD-10-CM

## 2019-01-23 DIAGNOSIS — I110 Hypertensive heart disease with heart failure: Secondary | ICD-10-CM | POA: Diagnosis not present

## 2019-01-23 DIAGNOSIS — E861 Hypovolemia: Secondary | ICD-10-CM | POA: Diagnosis present

## 2019-01-23 DIAGNOSIS — R188 Other ascites: Secondary | ICD-10-CM | POA: Diagnosis present

## 2019-01-23 DIAGNOSIS — R109 Unspecified abdominal pain: Secondary | ICD-10-CM

## 2019-01-23 DIAGNOSIS — Z951 Presence of aortocoronary bypass graft: Secondary | ICD-10-CM

## 2019-01-23 DIAGNOSIS — E162 Hypoglycemia, unspecified: Secondary | ICD-10-CM | POA: Diagnosis present

## 2019-01-23 DIAGNOSIS — E871 Hypo-osmolality and hyponatremia: Secondary | ICD-10-CM | POA: Diagnosis present

## 2019-01-23 DIAGNOSIS — I959 Hypotension, unspecified: Secondary | ICD-10-CM | POA: Diagnosis present

## 2019-01-23 DIAGNOSIS — I509 Heart failure, unspecified: Secondary | ICD-10-CM

## 2019-01-23 DIAGNOSIS — K921 Melena: Secondary | ICD-10-CM | POA: Diagnosis not present

## 2019-01-23 DIAGNOSIS — Z20828 Contact with and (suspected) exposure to other viral communicable diseases: Secondary | ICD-10-CM | POA: Diagnosis present

## 2019-01-23 DIAGNOSIS — Z954 Presence of other heart-valve replacement: Secondary | ICD-10-CM

## 2019-01-23 DIAGNOSIS — I10 Essential (primary) hypertension: Secondary | ICD-10-CM | POA: Diagnosis present

## 2019-01-23 DIAGNOSIS — G92 Toxic encephalopathy: Secondary | ICD-10-CM | POA: Diagnosis not present

## 2019-01-23 DIAGNOSIS — Z992 Dependence on renal dialysis: Secondary | ICD-10-CM

## 2019-01-23 DIAGNOSIS — D638 Anemia in other chronic diseases classified elsewhere: Secondary | ICD-10-CM | POA: Diagnosis present

## 2019-01-23 DIAGNOSIS — R791 Abnormal coagulation profile: Secondary | ICD-10-CM

## 2019-01-23 DIAGNOSIS — Z87891 Personal history of nicotine dependence: Secondary | ICD-10-CM

## 2019-01-23 DIAGNOSIS — K743 Primary biliary cirrhosis: Secondary | ICD-10-CM | POA: Diagnosis present

## 2019-01-23 DIAGNOSIS — E875 Hyperkalemia: Secondary | ICD-10-CM | POA: Diagnosis present

## 2019-01-23 DIAGNOSIS — I5023 Acute on chronic systolic (congestive) heart failure: Secondary | ICD-10-CM | POA: Diagnosis present

## 2019-01-23 DIAGNOSIS — I2510 Atherosclerotic heart disease of native coronary artery without angina pectoris: Secondary | ICD-10-CM | POA: Diagnosis present

## 2019-01-23 DIAGNOSIS — I480 Paroxysmal atrial fibrillation: Secondary | ICD-10-CM | POA: Diagnosis present

## 2019-01-23 DIAGNOSIS — Z79899 Other long term (current) drug therapy: Secondary | ICD-10-CM

## 2019-01-23 DIAGNOSIS — Z8249 Family history of ischemic heart disease and other diseases of the circulatory system: Secondary | ICD-10-CM

## 2019-01-23 DIAGNOSIS — R079 Chest pain, unspecified: Secondary | ICD-10-CM | POA: Diagnosis present

## 2019-01-23 DIAGNOSIS — Z833 Family history of diabetes mellitus: Secondary | ICD-10-CM

## 2019-01-23 DIAGNOSIS — L8990 Pressure ulcer of unspecified site, unspecified stage: Secondary | ICD-10-CM | POA: Insufficient documentation

## 2019-01-23 DIAGNOSIS — Z66 Do not resuscitate: Secondary | ICD-10-CM | POA: Diagnosis present

## 2019-01-23 DIAGNOSIS — Z515 Encounter for palliative care: Secondary | ICD-10-CM | POA: Diagnosis not present

## 2019-01-23 DIAGNOSIS — D6959 Other secondary thrombocytopenia: Secondary | ICD-10-CM | POA: Diagnosis present

## 2019-01-23 DIAGNOSIS — I700 Atherosclerosis of aorta: Secondary | ICD-10-CM | POA: Diagnosis present

## 2019-01-23 DIAGNOSIS — G4733 Obstructive sleep apnea (adult) (pediatric): Secondary | ICD-10-CM | POA: Diagnosis present

## 2019-01-23 DIAGNOSIS — E46 Unspecified protein-calorie malnutrition: Secondary | ICD-10-CM | POA: Diagnosis present

## 2019-01-23 DIAGNOSIS — I472 Ventricular tachycardia: Secondary | ICD-10-CM | POA: Diagnosis present

## 2019-01-23 DIAGNOSIS — R0602 Shortness of breath: Secondary | ICD-10-CM

## 2019-01-23 DIAGNOSIS — Z8673 Personal history of transient ischemic attack (TIA), and cerebral infarction without residual deficits: Secondary | ICD-10-CM

## 2019-01-23 DIAGNOSIS — D6832 Hemorrhagic disorder due to extrinsic circulating anticoagulants: Secondary | ICD-10-CM | POA: Diagnosis present

## 2019-01-23 DIAGNOSIS — I484 Atypical atrial flutter: Secondary | ICD-10-CM | POA: Diagnosis present

## 2019-01-23 DIAGNOSIS — I272 Pulmonary hypertension, unspecified: Secondary | ICD-10-CM | POA: Diagnosis present

## 2019-01-23 DIAGNOSIS — E872 Acidosis: Secondary | ICD-10-CM | POA: Diagnosis present

## 2019-01-23 DIAGNOSIS — Z7982 Long term (current) use of aspirin: Secondary | ICD-10-CM

## 2019-01-23 DIAGNOSIS — L03116 Cellulitis of left lower limb: Secondary | ICD-10-CM | POA: Diagnosis present

## 2019-01-23 DIAGNOSIS — I482 Chronic atrial fibrillation, unspecified: Secondary | ICD-10-CM | POA: Diagnosis present

## 2019-01-23 DIAGNOSIS — L89152 Pressure ulcer of sacral region, stage 2: Secondary | ICD-10-CM | POA: Diagnosis present

## 2019-01-23 DIAGNOSIS — N179 Acute kidney failure, unspecified: Secondary | ICD-10-CM | POA: Diagnosis present

## 2019-01-23 DIAGNOSIS — E785 Hyperlipidemia, unspecified: Secondary | ICD-10-CM | POA: Diagnosis present

## 2019-01-23 DIAGNOSIS — Z952 Presence of prosthetic heart valve: Secondary | ICD-10-CM

## 2019-01-23 DIAGNOSIS — N170 Acute kidney failure with tubular necrosis: Secondary | ICD-10-CM | POA: Diagnosis present

## 2019-01-23 LAB — I-STAT CHEM 8, ED
BUN: 25 mg/dL — ABNORMAL HIGH (ref 8–23)
Calcium, Ion: 1.12 mmol/L — ABNORMAL LOW (ref 1.15–1.40)
Chloride: 110 mmol/L (ref 98–111)
Creatinine, Ser: 1 mg/dL (ref 0.44–1.00)
Glucose, Bld: 85 mg/dL (ref 70–99)
HCT: 44 % (ref 36.0–46.0)
Hemoglobin: 15 g/dL (ref 12.0–15.0)
Potassium: 4.2 mmol/L (ref 3.5–5.1)
Sodium: 144 mmol/L (ref 135–145)
TCO2: 20 mmol/L — ABNORMAL LOW (ref 22–32)

## 2019-01-23 LAB — COMPREHENSIVE METABOLIC PANEL
ALT: 19 U/L (ref 0–44)
AST: 47 U/L — ABNORMAL HIGH (ref 15–41)
Albumin: 3.4 g/dL — ABNORMAL LOW (ref 3.5–5.0)
Alkaline Phosphatase: 60 U/L (ref 38–126)
Anion gap: 12 (ref 5–15)
BUN: 21 mg/dL (ref 8–23)
CO2: 21 mmol/L — ABNORMAL LOW (ref 22–32)
Calcium: 8.8 mg/dL — ABNORMAL LOW (ref 8.9–10.3)
Chloride: 106 mmol/L (ref 98–111)
Creatinine, Ser: 1.17 mg/dL — ABNORMAL HIGH (ref 0.44–1.00)
GFR calc Af Amer: 56 mL/min — ABNORMAL LOW (ref >60)
GFR calc non Af Amer: 48 mL/min — ABNORMAL LOW (ref >60)
Glucose, Bld: 92 mg/dL (ref 70–99)
Potassium: 4.2 mmol/L (ref 3.5–5.1)
Sodium: 139 mmol/L (ref 135–145)
Total Bilirubin: 1.5 mg/dL — ABNORMAL HIGH (ref 0.3–1.2)
Total Protein: 9.6 g/dL — ABNORMAL HIGH (ref 6.5–8.1)

## 2019-01-23 LAB — CBC WITH DIFFERENTIAL/PLATELET
Abs Immature Granulocytes: 0.01 10*3/uL (ref 0.00–0.07)
Basophils Absolute: 0 10*3/uL (ref 0.0–0.1)
Basophils Relative: 1 %
Eosinophils Absolute: 0.1 10*3/uL (ref 0.0–0.5)
Eosinophils Relative: 1 %
HCT: 41.6 % (ref 36.0–46.0)
Hemoglobin: 12.6 g/dL (ref 12.0–15.0)
Immature Granulocytes: 0 %
Lymphocytes Relative: 18 %
Lymphs Abs: 1.4 10*3/uL (ref 0.7–4.0)
MCH: 29.6 pg (ref 26.0–34.0)
MCHC: 30.3 g/dL (ref 30.0–36.0)
MCV: 97.7 fL (ref 80.0–100.0)
Monocytes Absolute: 0.4 10*3/uL (ref 0.1–1.0)
Monocytes Relative: 6 %
Neutro Abs: 5.7 10*3/uL (ref 1.7–7.7)
Neutrophils Relative %: 74 %
Platelets: 82 10*3/uL — ABNORMAL LOW (ref 150–400)
RBC: 4.26 MIL/uL (ref 3.87–5.11)
RDW: 16.4 % — ABNORMAL HIGH (ref 11.5–15.5)
WBC: 7.6 10*3/uL (ref 4.0–10.5)
nRBC: 0 % (ref 0.0–0.2)

## 2019-01-23 LAB — POCT I-STAT 7, (LYTES, BLD GAS, ICA,H+H)
Acid-base deficit: 6 mmol/L — ABNORMAL HIGH (ref 0.0–2.0)
Bicarbonate: 22.8 mmol/L (ref 20.0–28.0)
Calcium, Ion: 1.21 mmol/L (ref 1.15–1.40)
HCT: 42 % (ref 36.0–46.0)
Hemoglobin: 14.3 g/dL (ref 12.0–15.0)
O2 Saturation: 20 %
Patient temperature: 98.6
Potassium: 4.5 mmol/L (ref 3.5–5.1)
Sodium: 143 mmol/L (ref 135–145)
TCO2: 24 mmol/L (ref 22–32)
pCO2 arterial: 55.7 mmHg — ABNORMAL HIGH (ref 32.0–48.0)
pH, Arterial: 7.22 — ABNORMAL LOW (ref 7.350–7.450)
pO2, Arterial: 19 mmHg — CL (ref 83.0–108.0)

## 2019-01-23 LAB — TROPONIN I (HIGH SENSITIVITY): Troponin I (High Sensitivity): 20 ng/L — ABNORMAL HIGH (ref <18)

## 2019-01-23 LAB — PROTIME-INR
INR: 2.2 — ABNORMAL HIGH (ref 0.8–1.2)
Prothrombin Time: 24.2 seconds — ABNORMAL HIGH (ref 11.4–15.2)

## 2019-01-23 MED ORDER — FUROSEMIDE 10 MG/ML IJ SOLN
60.0000 mg | Freq: Once | INTRAMUSCULAR | Status: AC
  Administered 2019-01-23: 60 mg via INTRAVENOUS
  Filled 2019-01-23: qty 6

## 2019-01-23 NOTE — ED Provider Notes (Signed)
[facility] EMERGENCY DEPARTMENT Provider Note   CSN: [id number] Arrival date & time: 01/16/2019  2200     History   Chief Complaint Chief Complaint  Patient presents with  . Shortness of Breath    HPI Marie Malone is a 68 y.o. female.     The history is provided by the patient and medical records. No language interpreter was used.   Marie Malone is a 68 y.o. female who presents to the Emergency Department complaining of sob.  She presents to the ED complaining of shortness of breath. She became abruptly short of breath at 6 PM today. She has experienced a slight increase in shortness of breath over the course of the last week as well as progressive lower extremity edema. She did contact her doctor, who recommended increasing her Lasix to 60 mg daily, which she planned on taking tomorrow. Symptoms significantly worsened this afternoon and she called 911. She denies any fevers, chest pain, abdominal pain, nausea, vomiting. No recent COVID-19 exposures. EMS treated her with magnesium, Solu-Medrol and she was placed on CPAP. They report significant improvement in her symptoms after initiation of CPAP.  Past Medical History:  Diagnosis Date  . Atrial fibrillation ([place])   . Blood transfusion without reported diagnosis 05/2016  . Chronic diastolic CHF (congestive heart failure) ([place])   . Clotting disorder ([place]) 2014   placed on Warfarin  . Complication of anesthesia    difficult to wake up from anesthesia  . Coronary artery disease involving native coronary artery without angina pectoris 03/26/2016   100% chronic occlusion of RCA  . Dyspnea   . Heart murmur   . History of rheumatic fever    as a child  . Hyperlipidemia   . Hypertension   . Mitral stenosis   . OSA (obstructive sleep apnea)    mild per sleep study 2008  . Paroxysmal atrial fibrillation (HCC)   . Pneumonia    double pneumonia once  . Pulmonary hypertension ([place])   . Rheumatic mitral  regurgitation   . S/P balloon mitral valvuloplasty 2008   . 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 S/P mitral valve replacement with mechanical valve 05/28/2016   29 mm Sorin Carbomedics Optiform bileaflet mechanical prosthetic valve  . Stroke [facility])    x2    Patient Active Problem List   Diagnosis Date Noted  . Pruritus 07/08/2018  . Edema due to nutritional deficiency 07/08/2018  . Hypercholesterolemia 06/25/2016  . Postoperative anemia due to acute blood loss 06/08/2016  . S/P mitral valve replacement with mechanical valve + CABG x1 + Maze procedure 05/28/2016  . S/P CABG x 1 05/28/2016  . S/P Maze operation for atrial fibrillation 05/28/2016  . Chronic diastolic CHF (congestive heart failure) ([place])   . Coronary artery disease involving native coronary artery without angina pectoris 03/26/2016  . Second degree Mobitz I AV block 03/25/2016  . Tongue deviation   . Asymptomatic stenosis of right carotid artery s/p right CEA 03/10/16 03/10/2016  . Cerebral infarction due to embolism of left middle cerebral artery ([place]) 04/09/2014  . Valvular vegetation 04/09/2014  . Encounter for therapeutic drug monitoring 06/04/2013  . Paroxysmal atrial fibrillation ([place]) 04/19/2013  . Cardiac device in situ 04/19/2013  . Endocarditis 04/13/2013  . History of CVA (cerebrovascular accident) 04/12/2013  . Pulmonary hypertension ([place]) 04/06/2013  . S/P balloon mitral valvuloplasty 2008   . Essential hypertension 08/30/2012  .  History of rheumatic fever 08/30/2012    Past Surgical History:  Procedure Laterality Date  . APPENDECTOMY  1989   appendix ruptured,had peritonitis  . BALLOON VALVULOPLASTY  2008   [facility]  . CARDIAC CATHETERIZATION N/A 03/25/2016   Procedure: Right/Left Heart Cath and Coronary Angiography;  Surgeon: Jettie Booze, MD;  Location: [facility] CV LAB;  Service: Cardiovascular;  Laterality: N/A;  . COLONOSCOPY W/ POLYPECTOMY    . CORONARY ARTERY  BYPASS GRAFT N/A 05/28/2016   Procedure: CORONARY ARTERY BYPASS GRAFTING (CABG) x 1 WITH ENDOSCOPIC HARVESTING OF RIGHT SAPHENOUS VEIN, EVH- PDA;  Surgeon: Rexene Alberts, MD;  Location: [facility];  Service: Open Heart Surgery;  Laterality: N/A;  . ENDARTERECTOMY Right 03/10/2016   Procedure: ENDARTERECTOMY CAROTID RIGHT;  Surgeon: Serafina Mitchell, MD;  Location: [facility];  Service: Vascular;  Laterality: Right;  . EP IMPLANTABLE DEVICE N/A 03/11/2016   Procedure: Loop Recorder Removal;  Surgeon: Deboraha Sprang, MD;  Location: [facility] CV LAB;  Service: Cardiovascular;  Laterality: N/A;  . 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 LOOP RECORDER IMPLANT N/A 04/10/2013   Procedure: LOOP RECORDER IMPLANT;  Surgeon: Coralyn Mark, MD;  Location: [facility] CATH LAB;  Service: Cardiovascular;  Laterality: N/A;  . MAZE N/A 05/28/2016   Procedure: MAZE PROCEDURE AND APPLICATION OF  ATRICLIP LAA PROCLIP II 42 MM;  Surgeon: Rexene Alberts, MD;  Location: [facility];  Service: Open Heart Surgery;  Laterality: N/A;  . MITRAL VALVE REPLACEMENT N/A 05/28/2016   Procedure: MITRAL VALVE (MV) REPLACEMENT USING 29 MM CARBOMEDICS OPTIFORM MECHANICAL BILEAFLET PROSTHETIC HEART VALVE;  Surgeon: Rexene Alberts, MD;  Location: [facility];  Service: Open Heart Surgery;  Laterality: N/A;  . MULTIPLE EXTRACTIONS WITH ALVEOLOPLASTY N/A 03/30/2016   Procedure: EXTRACTION OF TOOTH #'S 2,5,6,11,15,16,20-30 AND 32 WITH ALVEOLOPLASTY AND BILATERAL MAXILLARY LATERAL EXOSTOSES REDUCTIONS;  Surgeon: Lenn Cal, DDS;  Location: [facility];  Service: Oral Surgery;  Laterality: N/A;  . PATCH ANGIOPLASTY Right 03/10/2016   Procedure: PATCH ANGIOPLASTY CAROTID RIGHT USING Rueben Bash BIOLOGIC PATCH;  Surgeon: Serafina Mitchell, MD;  Location: [facility];  Service: Vascular;  Laterality: Right;  . TEE WITHOUT CARDIOVERSION N/A 04/10/2013   Procedure: TRANSESOPHAGEAL ECHOCARDIOGRAM (TEE);  Surgeon: [name], MD;   Location[facility] ENDOSCOPY;  Service: Cardiovascular;  Laterality: N/A;  . TEE WITHOUT CARDIOVERSION N/A 03/24/2016   Procedure: TRANSESOPHAGEAL ECHOCARDIOGRAM (TEE);  Surgeon: Jerline Pain, MD;  Location: [facility];  Service: Cardiovascular;  Laterality: N/A;  . TEE WITHOUT CARDIOVERSION N/A 05/28/2016   Procedure: TRANSESOPHAGEAL ECHOCARDIOGRAM (TEE);  Surgeon: Rexene Alberts, MD;  Location: [facility];  Service: Open Heart Surgery;  Laterality: N/A;  . TUBAL LIGATION       OB History   No obstetric history on file.      Home Medications    Prior to Admission medications   Medication Sig Start Date End Date Taking? Authorizing Provider  aspirin EC 81 MG EC tablet Take 1 tablet (81 mg total) by mouth daily. 06/05/16   Gold, Wilder Glade, PA-C  atorvastatin (LIPITOR) 80 MG tablet Take 0.5 tablets (40 mg total) by mouth daily at 6 PM. 07/25/18   Debbrah Alar, NP  betamethasone dipropionate (DIPROLENE) 0.05 % cream Apply topically 2 (two) times daily. 08/01/18   Debbrah Alar, NP  diphenhydrAMINE (BENADRYL) 25 mg capsule Take 25 mg by mouth at bedtime as needed for itching.  [provider]  furosemide (LASIX) 20 MG tablet Take 2 tabs by mouth in the AM and 1 tab by mouth in the PM Patient taking differently: Take 40 mg by mouth daily. Take 2 tabs by mouth in the AM and 1 tab by mouth in the PM 07/25/18   Debbrah Alar, NP  loratadine (CLARITIN) 10 MG tablet Take 10 mg by mouth daily.    [provider]  losartan (COZAAR) 25 MG tablet TAKE 1 TABLET BY MOUTH ONCE DAILY *NEED OFFICE VISIT* 10/25/18   Debbrah Alar, NP  metoprolol succinate (TOPROL-XL) 50 MG 24 hr tablet TAKE 1 TABLET BY MOUTH ONCE DAILY WITH FOOD OR IMMEDIATELY FOLLOWING A MEAL . APPOINTMENT REQUIRED FOR FUTURE REFILLS 01/10/19   Debbrah Alar, NP  potassium chloride (K-DUR) 10 MEQ tablet Take 1 tablet (10 mEq total) by mouth 2 (two) times daily. Take 1 tab daily and a 2nd tab daily prn when  you take a 2nd Furosemide tab in a day 07/25/18   Debbrah Alar, NP  ursodiol (ACTIGALL) 500 MG tablet Take 1 tablet (500 mg total) by mouth 2 (two) times daily. 07/26/18   Irene Shipper, MD  warfarin (COUMADIN) 4 MG tablet TAKE 1 TABLET BY MOUTH ONCE DAILY OR  AS  DIRECTED  BY  COUMADIN  CLINIC 11/16/18   [name], MD    Family History Family History  Problem Relation Age of Onset  . Diabetes Mother   . Stroke Mother   . Diabetes Father   . Heart disease Father        CHF  . Cancer Father        kidney  . Diabetes Sister   . Diabetes Brother   . Hypertension Daughter   . Colon polyps Neg Hx   . Colon cancer Neg Hx   . Esophageal cancer Neg Hx   . Rectal cancer Neg Hx   . Stomach cancer Neg Hx     Social History Social History   Tobacco Use  . Smoking status: Former Smoker    Packs/day: 1.00    Years: 5.00    Pack years: 5.00    Types: Cigarettes    Start date: 07/15/1970    Quit date: 05/11/1975    Years since quitting: 43.7  . Smokeless tobacco: Never Used  . Tobacco comment: quit smoking 36 years ago  Substance Use Topics  . Alcohol use: No    Alcohol/week: 0.0 standard drinks  . Drug use: No     Allergies   No known allergies   Review of Systems Review of Systems  All other systems reviewed and are negative.    Physical Exam Updated Vital Signs There were no vitals taken for this visit.  Physical Exam Vitals signs and nursing note reviewed.  Constitutional:      Appearance: She is well-developed.     Comments: Frail appearing  HENT:     Head: Normocephalic and atraumatic.  Cardiovascular:     Rate and Rhythm: Normal rate and regular rhythm.     Heart sounds: No murmur.  Pulmonary:     Effort: Pulmonary effort is normal. No respiratory distress.     Comments: Tachypnea with diffuse wheezes Abdominal:     Palpations: Abdomen is soft.     Tenderness: There is no abdominal tenderness. There is no guarding or rebound.  Musculoskeletal:         General: No tenderness.     Comments: 1 to 2+ pitting  edema to bilateral lower extremities  Skin:    General: Skin is warm and dry.  Neurological:     Mental Status: She is alert and oriented to person, place, and time.  Psychiatric:        Behavior: Behavior normal.      ED Treatments / Results  Labs (all labs ordered are listed, but only abnormal results are displayed) Labs Reviewed  SARS CORONAVIRUS 2 ([facility])  CBC WITH DIFFERENTIAL/PLATELET  PROTIME-INR  BRAIN NATRIURETIC PEPTIDE  COMPREHENSIVE METABOLIC PANEL  I-STAT ARTERIAL BLOOD GAS, ED  TROPONIN I (HIGH SENSITIVITY)    EKG None  Radiology No results found.  Procedures Procedures (including critical care time)  Medications Ordered in ED Medications - No data to display   Initial Impression / Assessment and Plan / ED Course  I have reviewed the triage vital signs and the nursing notes.  Pertinent labs & imaging results that were available during my care of the patient were reviewed by me and considered in my medical decision making (see chart for details).        Patient with history of coronary artery disease, congestive heart failure here for evaluation of shortness of breath. She is volume overloaded on examination. She was continued on BiPAP after ED arrival. Chest x-ray consistent with pulmonary edema, will treat with Lasix. Patient care transferred pending labs, reassessment.  Marie Malone was evaluated in Emergency Department on 01/19/2019 for the symptoms described in the history of present illness. She was evaluated in the context of the global COVID-19 pandemic, which necessitated consideration that the patient might be at risk for infection with the SARS-CoV-2 virus that causes COVID-19. Institutional protocols and algorithms that pertain to the evaluation of patients at risk for COVID-19 are in a state of rapid change based on information  released by regulatory bodies including the CDC and federal and state organizations. These policies and algorithms were followed during the patient's care in the ED.   Final Clinical Impressions(s) / ED Diagnoses   Final diagnoses:  None    ED Discharge Orders    None       Quintella Reichert, MD 01/24/19 0013

## 2019-01-23 NOTE — ED Triage Notes (Signed)
Ems reports pt was shob around 6pm. Pt arrived with bipap. Pt says she took lasics. Ems gave duo neb, solumedrol, bp was 150/130. Ems report pt's fingers were black. Initial O2 was in the 60's.

## 2019-01-24 ENCOUNTER — Encounter (HOSPITAL_COMMUNITY): Payer: Self-pay | Admitting: Internal Medicine

## 2019-01-24 ENCOUNTER — Inpatient Hospital Stay (HOSPITAL_COMMUNITY): Payer: Medicare Other

## 2019-01-24 DIAGNOSIS — I959 Hypotension, unspecified: Secondary | ICD-10-CM | POA: Diagnosis present

## 2019-01-24 DIAGNOSIS — I482 Chronic atrial fibrillation, unspecified: Secondary | ICD-10-CM | POA: Diagnosis present

## 2019-01-24 DIAGNOSIS — I371 Nonrheumatic pulmonary valve insufficiency: Secondary | ICD-10-CM | POA: Diagnosis not present

## 2019-01-24 DIAGNOSIS — J9601 Acute respiratory failure with hypoxia: Secondary | ICD-10-CM | POA: Diagnosis present

## 2019-01-24 DIAGNOSIS — K921 Melena: Secondary | ICD-10-CM | POA: Diagnosis not present

## 2019-01-24 DIAGNOSIS — I484 Atypical atrial flutter: Secondary | ICD-10-CM | POA: Diagnosis present

## 2019-01-24 DIAGNOSIS — I5043 Acute on chronic combined systolic (congestive) and diastolic (congestive) heart failure: Secondary | ICD-10-CM | POA: Diagnosis present

## 2019-01-24 DIAGNOSIS — E872 Acidosis: Secondary | ICD-10-CM | POA: Diagnosis present

## 2019-01-24 DIAGNOSIS — I2510 Atherosclerotic heart disease of native coronary artery without angina pectoris: Secondary | ICD-10-CM | POA: Diagnosis present

## 2019-01-24 DIAGNOSIS — D638 Anemia in other chronic diseases classified elsewhere: Secondary | ICD-10-CM | POA: Diagnosis present

## 2019-01-24 DIAGNOSIS — I509 Heart failure, unspecified: Secondary | ICD-10-CM | POA: Insufficient documentation

## 2019-01-24 DIAGNOSIS — E46 Unspecified protein-calorie malnutrition: Secondary | ICD-10-CM | POA: Diagnosis present

## 2019-01-24 DIAGNOSIS — N170 Acute kidney failure with tubular necrosis: Secondary | ICD-10-CM | POA: Diagnosis present

## 2019-01-24 DIAGNOSIS — Z515 Encounter for palliative care: Secondary | ICD-10-CM | POA: Diagnosis not present

## 2019-01-24 DIAGNOSIS — R188 Other ascites: Secondary | ICD-10-CM | POA: Diagnosis present

## 2019-01-24 DIAGNOSIS — I472 Ventricular tachycardia: Secondary | ICD-10-CM | POA: Diagnosis present

## 2019-01-24 DIAGNOSIS — E871 Hypo-osmolality and hyponatremia: Secondary | ICD-10-CM | POA: Diagnosis present

## 2019-01-24 DIAGNOSIS — I272 Pulmonary hypertension, unspecified: Secondary | ICD-10-CM

## 2019-01-24 DIAGNOSIS — I10 Essential (primary) hypertension: Secondary | ICD-10-CM | POA: Diagnosis not present

## 2019-01-24 DIAGNOSIS — Z66 Do not resuscitate: Secondary | ICD-10-CM | POA: Diagnosis present

## 2019-01-24 DIAGNOSIS — I361 Nonrheumatic tricuspid (valve) insufficiency: Secondary | ICD-10-CM

## 2019-01-24 DIAGNOSIS — R52 Pain, unspecified: Secondary | ICD-10-CM | POA: Diagnosis not present

## 2019-01-24 DIAGNOSIS — I480 Paroxysmal atrial fibrillation: Secondary | ICD-10-CM | POA: Diagnosis not present

## 2019-01-24 DIAGNOSIS — M7989 Other specified soft tissue disorders: Secondary | ICD-10-CM | POA: Diagnosis not present

## 2019-01-24 DIAGNOSIS — D62 Acute posthemorrhagic anemia: Secondary | ICD-10-CM | POA: Diagnosis present

## 2019-01-24 DIAGNOSIS — R791 Abnormal coagulation profile: Secondary | ICD-10-CM | POA: Diagnosis not present

## 2019-01-24 DIAGNOSIS — D6832 Hemorrhagic disorder due to extrinsic circulating anticoagulants: Secondary | ICD-10-CM | POA: Diagnosis present

## 2019-01-24 DIAGNOSIS — I110 Hypertensive heart disease with heart failure: Secondary | ICD-10-CM | POA: Diagnosis present

## 2019-01-24 DIAGNOSIS — G92 Toxic encephalopathy: Secondary | ICD-10-CM | POA: Diagnosis not present

## 2019-01-24 DIAGNOSIS — N179 Acute kidney failure, unspecified: Secondary | ICD-10-CM | POA: Diagnosis not present

## 2019-01-24 DIAGNOSIS — D6959 Other secondary thrombocytopenia: Secondary | ICD-10-CM | POA: Diagnosis present

## 2019-01-24 DIAGNOSIS — K743 Primary biliary cirrhosis: Secondary | ICD-10-CM | POA: Diagnosis not present

## 2019-01-24 DIAGNOSIS — E162 Hypoglycemia, unspecified: Secondary | ICD-10-CM | POA: Diagnosis present

## 2019-01-24 DIAGNOSIS — Z954 Presence of other heart-valve replacement: Secondary | ICD-10-CM

## 2019-01-24 DIAGNOSIS — L03116 Cellulitis of left lower limb: Secondary | ICD-10-CM | POA: Diagnosis present

## 2019-01-24 DIAGNOSIS — Z20828 Contact with and (suspected) exposure to other viral communicable diseases: Secondary | ICD-10-CM | POA: Diagnosis present

## 2019-01-24 DIAGNOSIS — Z951 Presence of aortocoronary bypass graft: Secondary | ICD-10-CM | POA: Diagnosis not present

## 2019-01-24 DIAGNOSIS — Z952 Presence of prosthetic heart valve: Secondary | ICD-10-CM | POA: Diagnosis not present

## 2019-01-24 DIAGNOSIS — D72829 Elevated white blood cell count, unspecified: Secondary | ICD-10-CM | POA: Diagnosis not present

## 2019-01-24 DIAGNOSIS — I5023 Acute on chronic systolic (congestive) heart failure: Secondary | ICD-10-CM | POA: Diagnosis not present

## 2019-01-24 LAB — COMPREHENSIVE METABOLIC PANEL
ALT: 18 U/L (ref 0–44)
AST: 54 U/L — ABNORMAL HIGH (ref 15–41)
Albumin: 3.1 g/dL — ABNORMAL LOW (ref 3.5–5.0)
Alkaline Phosphatase: 53 U/L (ref 38–126)
Anion gap: 13 (ref 5–15)
BUN: 24 mg/dL — ABNORMAL HIGH (ref 8–23)
CO2: 17 mmol/L — ABNORMAL LOW (ref 22–32)
Calcium: 8.6 mg/dL — ABNORMAL LOW (ref 8.9–10.3)
Chloride: 109 mmol/L (ref 98–111)
Creatinine, Ser: 1.38 mg/dL — ABNORMAL HIGH (ref 0.44–1.00)
GFR calc Af Amer: 46 mL/min — ABNORMAL LOW (ref >60)
GFR calc non Af Amer: 39 mL/min — ABNORMAL LOW (ref >60)
Glucose, Bld: 106 mg/dL — ABNORMAL HIGH (ref 70–99)
Potassium: 3.9 mmol/L (ref 3.5–5.1)
Sodium: 139 mmol/L (ref 135–145)
Total Bilirubin: 1.8 mg/dL — ABNORMAL HIGH (ref 0.3–1.2)
Total Protein: 8.6 g/dL — ABNORMAL HIGH (ref 6.5–8.1)

## 2019-01-24 LAB — CBC WITH DIFFERENTIAL/PLATELET
Abs Immature Granulocytes: 0.06 10*3/uL (ref 0.00–0.07)
Basophils Absolute: 0 10*3/uL (ref 0.0–0.1)
Basophils Relative: 0 %
Eosinophils Absolute: 0 10*3/uL (ref 0.0–0.5)
Eosinophils Relative: 0 %
HCT: 37.1 % (ref 36.0–46.0)
Hemoglobin: 11.7 g/dL — ABNORMAL LOW (ref 12.0–15.0)
Immature Granulocytes: 1 %
Lymphocytes Relative: 3 %
Lymphs Abs: 0.4 10*3/uL — ABNORMAL LOW (ref 0.7–4.0)
MCH: 29 pg (ref 26.0–34.0)
MCHC: 31.5 g/dL (ref 30.0–36.0)
MCV: 92.1 fL (ref 80.0–100.0)
Monocytes Absolute: 1 10*3/uL (ref 0.1–1.0)
Monocytes Relative: 9 %
Neutro Abs: 9.5 10*3/uL — ABNORMAL HIGH (ref 1.7–7.7)
Neutrophils Relative %: 87 %
Platelets: 68 10*3/uL — ABNORMAL LOW (ref 150–400)
RBC: 4.03 MIL/uL (ref 3.87–5.11)
RDW: 16 % — ABNORMAL HIGH (ref 11.5–15.5)
WBC: 10.9 10*3/uL — ABNORMAL HIGH (ref 4.0–10.5)
nRBC: 0 % (ref 0.0–0.2)

## 2019-01-24 LAB — PROTIME-INR
INR: 2.8 — ABNORMAL HIGH (ref 0.8–1.2)
Prothrombin Time: 28.8 seconds — ABNORMAL HIGH (ref 11.4–15.2)

## 2019-01-24 LAB — POCT I-STAT 7, (LYTES, BLD GAS, ICA,H+H)
Acid-base deficit: 5 mmol/L — ABNORMAL HIGH (ref 0.0–2.0)
Bicarbonate: 19 mmol/L — ABNORMAL LOW (ref 20.0–28.0)
Calcium, Ion: 1.18 mmol/L (ref 1.15–1.40)
HCT: 38 % (ref 36.0–46.0)
Hemoglobin: 12.9 g/dL (ref 12.0–15.0)
O2 Saturation: 97 %
Patient temperature: 98.6
Potassium: 4.2 mmol/L (ref 3.5–5.1)
Sodium: 142 mmol/L (ref 135–145)
TCO2: 20 mmol/L — ABNORMAL LOW (ref 22–32)
pCO2 arterial: 30.1 mmHg — ABNORMAL LOW (ref 32.0–48.0)
pH, Arterial: 7.408 (ref 7.350–7.450)
pO2, Arterial: 90 mmHg (ref 83.0–108.0)

## 2019-01-24 LAB — TROPONIN I (HIGH SENSITIVITY)
Troponin I (High Sensitivity): 132 ng/L (ref <18)
Troponin I (High Sensitivity): 111 ng/L (ref <18)
Troponin I (High Sensitivity): 113 ng/L (ref <18)

## 2019-01-24 LAB — HIV ANTIBODY (ROUTINE TESTING W REFLEX): HIV Screen 4th Generation wRfx: NONREACTIVE

## 2019-01-24 LAB — SARS CORONAVIRUS 2 BY RT PCR (HOSPITAL ORDER, PERFORMED IN ~~LOC~~ HOSPITAL LAB): SARS Coronavirus 2: NEGATIVE

## 2019-01-24 LAB — TSH: TSH: 3.148 u[IU]/mL (ref 0.350–4.500)

## 2019-01-24 LAB — BRAIN NATRIURETIC PEPTIDE: B Natriuretic Peptide: 1699.1 pg/mL — ABNORMAL HIGH (ref 0.0–100.0)

## 2019-01-24 MED ORDER — URSODIOL 300 MG PO CAPS
300.0000 mg | ORAL_CAPSULE | Freq: Two times a day (BID) | ORAL | Status: DC
  Administered 2019-01-24 – 2019-01-26 (×4): 300 mg via ORAL
  Filled 2019-01-24 (×6): qty 1

## 2019-01-24 MED ORDER — ACETAMINOPHEN 325 MG PO TABS
650.0000 mg | ORAL_TABLET | Freq: Four times a day (QID) | ORAL | Status: DC | PRN
  Administered 2019-01-24 – 2019-02-02 (×21): 650 mg via ORAL
  Filled 2019-01-24 (×22): qty 2

## 2019-01-24 MED ORDER — ACETAMINOPHEN 650 MG RE SUPP: 650.0000 mg | Freq: Four times a day (QID) | RECTAL | Status: DC | PRN

## 2019-01-24 MED ORDER — ONDANSETRON HCL 4 MG/2ML IJ SOLN: 4.0000 mg | Freq: Four times a day (QID) | INTRAMUSCULAR | Status: DC | PRN

## 2019-01-24 MED ORDER — ASPIRIN EC 81 MG PO TBEC
81.0000 mg | DELAYED_RELEASE_TABLET | Freq: Every day | ORAL | Status: DC
  Administered 2019-01-24 – 2019-02-06 (×13): 81 mg via ORAL
  Filled 2019-01-24 (×14): qty 1

## 2019-01-24 MED ORDER — ONDANSETRON HCL 4 MG PO TABS: 4.0000 mg | ORAL_TABLET | Freq: Four times a day (QID) | ORAL | Status: DC | PRN

## 2019-01-24 MED ORDER — FUROSEMIDE 10 MG/ML IJ SOLN
40.0000 mg | Freq: Two times a day (BID) | INTRAMUSCULAR | Status: DC
  Administered 2019-01-24 – 2019-01-25 (×2): 40 mg via INTRAVENOUS
  Filled 2019-01-24 (×4): qty 4

## 2019-01-24 MED ORDER — ACETAMINOPHEN 325 MG PO TABS
650.0000 mg | ORAL_TABLET | Freq: Once | ORAL | Status: AC
  Administered 2019-01-24: 01:00:00 650 mg via ORAL
  Filled 2019-01-24: qty 2

## 2019-01-24 MED ORDER — POTASSIUM CHLORIDE CRYS ER 10 MEQ PO TBCR
10.0000 meq | EXTENDED_RELEASE_TABLET | Freq: Two times a day (BID) | ORAL | Status: DC
  Administered 2019-01-24 – 2019-01-25 (×4): 10 meq via ORAL
  Filled 2019-01-24 (×4): qty 1

## 2019-01-24 MED ORDER — LOSARTAN POTASSIUM 25 MG PO TABS
25.0000 mg | ORAL_TABLET | Freq: Every day | ORAL | Status: DC
  Administered 2019-01-24 – 2019-01-25 (×2): 25 mg via ORAL
  Filled 2019-01-24 (×2): qty 1

## 2019-01-24 MED ORDER — WARFARIN - PHARMACIST DOSING INPATIENT: Freq: Every day | Status: DC

## 2019-01-24 MED ORDER — METOPROLOL SUCCINATE ER 50 MG PO TB24
50.0000 mg | ORAL_TABLET | Freq: Every day | ORAL | Status: DC
  Administered 2019-01-24 – 2019-01-25 (×2): 50 mg via ORAL
  Filled 2019-01-24: qty 1
  Filled 2019-01-24: qty 2

## 2019-01-24 MED ORDER — URSODIOL 500 MG PO TABS: 500.0000 mg | ORAL_TABLET | Freq: Two times a day (BID) | ORAL | Status: DC

## 2019-01-24 MED ORDER — ATORVASTATIN CALCIUM 40 MG PO TABS
40.0000 mg | ORAL_TABLET | Freq: Every day | ORAL | Status: DC
  Administered 2019-01-24 – 2019-02-15 (×22): 40 mg via ORAL
  Filled 2019-01-24 (×24): qty 1

## 2019-01-24 MED ORDER — WARFARIN SODIUM 4 MG PO TABS
4.0000 mg | ORAL_TABLET | Freq: Once | ORAL | Status: AC
  Administered 2019-01-24: 17:00:00 4 mg via ORAL
  Filled 2019-01-24 (×2): qty 1

## 2019-01-24 NOTE — Progress Notes (Signed)
PROGRESS NOTE    Marie Malone  DXA:[id number] DOB: [birth year] DOA: 01/14/2019 PCP: Debbrah Alar, NP       Day 0 note  Patient admitted earlier this morning with complaints of increasing shortness of breath concerning for volume overload and hypoxia placed on BiPAP, bilateral pleural effusions with elevated BNP in the setting of likely heart failure exacerbation.  Patient also noted to be in A. fib with mild tachycardia in the low 100s.   Echocardiogram pending -old echo shows EF 45 to 50% in 2018; cardiology following, appreciate insight and recommendations.   Continue Lasix IV 40 twice daily, ARB, metoprolol XL, will follow I's and O's/fluid restriction salt restriction, continue supportive care for hypoxia with nasal cannula versus BiPAP as indicated.  Discussed case with pharmacy, patient's INR goal should be 2.5-3.5 given mechanical valve, will adjust patient's home warfarin accordingly.  SPECT additional 48 to 72 hours of aggressive inpatient treatment and therapy prior to ultimate disposition likely home.  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   S/P CABG x 1   Objective: Vitals:   01/24/19 1545 01/24/19 1600 01/24/19 1615 01/24/19 1630  BP: 134/69 121/69 128/73 123/67  Pulse:  73    Resp: (!) 23 20 (!) 22 (!) 21  Temp:      TempSrc:      SpO2:  99%     CBC: Recent Labs  Lab 02/05/2019 2211 02/05/2019 2234 01/24/2019 2250 01/24/19 0224 01/24/19 0316  WBC 7.6  --   --   --  10.9*  NEUTROABS 5.7  --   --   --  9.5*  HGB 12.6 14.3 15.0 12.9 11.7*  HCT 41.6 42.0 44.0 38.0 37.1  MCV 97.7  --   --   --  92.1  PLT 82*  --   --   --  68*   Basic Metabolic Panel: Recent Labs  Lab 02/05/2019 2211 01/28/2019 2234 01/19/2019 2250  01/24/19 0224 01/24/19 0316  NA 139 143 144 142 139  K 4.2 4.5 4.2 4.2 3.9  CL 106  --  110  --  109  CO2 21*  --   --   --  17*  GLUCOSE 92  --  85  --  106*  BUN 21  --  25*  --  24*  CREATININE 1.17*  --  1.00  --  1.38*  CALCIUM 8.8*  --   --   --  8.6*   Liver Function Tests: Recent Labs  Lab 01/31/2019 2211 01/24/19 0316  AST 47* 54*  ALT 19 18  ALKPHOS 60 53  BILITOT 1.5* 1.8*  PROT 9.6* 8.6*  ALBUMIN 3.4* 3.1*   Coagulation Profile: Recent Labs  Lab 01/22/2019 2211 01/24/19 1116  INR 2.2* 2.8*   Radiology Studies: Dg Chest Port 1 View  Result Date: 01/30/2019 CLINICAL DATA:  Shortness of breath, patient on BiPAP EXAM: PORTABLE CHEST 1 VIEW COMPARISON:  Radiograph 07/05/2016 FINDINGS: Postsurgical changes related to prior CABG including intact and aligned sternotomy wires and multiple surgical clips projecting over the mediastinum. Aortic valve replacement is noted. Atrial occlusion clip is present. Borderline cardiomegaly. Small right pleural effusion is present. There is diffuse interstitial opacities throughout the lungs, increased from prior studies. Central peribronchovascular thickening  is noted as well with cephalized, indistinct vascularity. No focal consolidative process. No pneumothorax. No acute osseous or soft tissue abnormality. IMPRESSION: Findings favor CHF/volume overload with mild cardiomegaly, interstitial pulmonary edema with small right pleural effusion. Electronically Signed   By: Lovena Le M.D.   On: 01/19/2019 22:22        Scheduled Meds: . aspirin EC  81 mg Oral Daily  . atorvastatin  40 mg Oral q1800  . furosemide  40 mg Intravenous Q12H  . losartan  25 mg Oral Daily  . metoprolol succinate  50 mg Oral Daily  . potassium chloride  10 mEq Oral BID  . ursodiol  300 mg Oral BID  . warfarin  4 mg Oral ONCE-1800  . Warfarin - Pharmacist Dosing Inpatient   Does not apply q1800     LOS: 0 days   Time spent: 75min  Little Ishikawa,  DO [facility]  If 7PM-7AM, please contact night-coverage [email or web address] Password [facility] 01/24/2019, 4:48 PM

## 2019-01-24 NOTE — Progress Notes (Signed)
ANTICOAGULATION CONSULT NOTE - Initial Consult  Pharmacy Consult for Coumadin Indication: atrial fibrillation and Mechanical valve  Allergies  Allergen Reactions  . No Known Allergies     Patient Measurements:    Vital Signs: Temp: 100.8 F (38.2 C) (09/16 0044) Temp Source: Oral (09/16 0044) BP: 117/65 (09/16 0315) Pulse Rate: 72 (09/16 0315)  Labs: Recent Labs    01/16/2019 2211 01/26/2019 2234 02/05/2019 2250 01/24/19 0224  HGB 12.6 14.3 15.0 12.9  HCT 41.6 42.0 44.0 38.0  PLT 82*  --   --   --   LABPROT 24.2*  --   --   --   INR 2.2*  --   --   --   CREATININE 1.17*  --  1.00  --   TROPONINIHS 20*  --   --   --     CrCl cannot be calculated (Unknown ideal weight.).   Medical History: Past Medical History:  Diagnosis Date  . Atrial fibrillation ([place])   . Blood transfusion without reported diagnosis 05/2016  . Chronic diastolic CHF (congestive heart failure) ([place])   . Clotting disorder ([place]) 2014   placed on Warfarin  . Complication of anesthesia    difficult to wake up from anesthesia  . Coronary artery disease involving native coronary artery without angina pectoris 03/26/2016   100% chronic occlusion of RCA  . Dyspnea   . Heart murmur   . History of rheumatic fever    as a child  . Hyperlipidemia   . Hypertension   . Mitral stenosis   . OSA (obstructive sleep apnea)    mild per sleep study 2008  . Paroxysmal atrial fibrillation (HCC)   . Pneumonia    double pneumonia once  . Pulmonary hypertension ([place])   . Rheumatic mitral regurgitation   . S/P balloon mitral valvuloplasty 2008   . 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 S/P mitral valve replacement with mechanical valve 05/28/2016   29 mm Sorin Carbomedics Optiform bileaflet mechanical prosthetic valve  . Stroke [facility])    x2    Medications:  No current facility-administered medications on file prior to encounter.    Current Outpatient Medications on File Prior to  Encounter  Medication Sig Dispense Refill  . aspirin EC 81 MG EC tablet Take 1 tablet (81 mg total) by mouth daily.    [name] Kitchen atorvastatin (LIPITOR) 80 MG tablet Take 0.5 tablets (40 mg total) by mouth daily at 6 PM. 45 tablet 1  . betamethasone dipropionate (DIPROLENE) 0.05 % cream Apply topically 2 (two) times daily. 45 g 1  . diphenhydrAMINE (BENADRYL) 25 mg capsule Take 25 mg by mouth at bedtime as needed for itching.    . furosemide (LASIX) 20 MG tablet Take 2 tabs by mouth in the AM and 1 tab by mouth in the PM (Patient taking differently: Take 40 mg by mouth daily. Take 2 tabs by mouth in the AM and 1 tab by mouth in the PM) 90 tablet 3  . loratadine (CLARITIN) 10 MG tablet Take 10 mg by mouth daily.    [name] Kitchen losartan (COZAAR) 25 MG tablet TAKE 1 TABLET BY MOUTH ONCE DAILY *NEED OFFICE VISIT* 90 tablet 1  . metoprolol succinate (TOPROL-XL) 50 MG 24 hr tablet TAKE 1 TABLET BY MOUTH ONCE DAILY WITH FOOD OR IMMEDIATELY FOLLOWING A MEAL . APPOINTMENT REQUIRED FOR FUTURE REFILLS 30 tablet 0  . potassium chloride (K-DUR) 10 MEQ tablet Take  1 tablet (10 mEq total) by mouth 2 (two) times daily. Take 1 tab daily and a 2nd tab daily prn when you take a 2nd Furosemide tab in a day 120 tablet 1  . ursodiol (ACTIGALL) 500 MG tablet Take 1 tablet (500 mg total) by mouth 2 (two) times daily. 60 tablet 11  . warfarin (COUMADIN) 4 MG tablet TAKE 1 TABLET BY MOUTH ONCE DAILY OR  AS  DIRECTED  BY  COUMADIN  CLINIC 100 tablet 0     Assessment: 68 y.o. female admitted with CHF/SOB, h/o Afib and mechanical MVR, to continue Coumadin  Goal of Therapy:  INR 2.5-3.5 Monitor platelets by anticoagulation protocol: Yes   Plan:  F/U daily INR  Sirena Riddle, Bronson Curb 01/24/2019,3:44 AM

## 2019-01-24 NOTE — Progress Notes (Signed)
  Echocardiogram 2D Echocardiogram has been performed.  Jennette Dubin 01/24/2019, 3:58 PM

## 2019-01-24 NOTE — Progress Notes (Signed)
[place] for Coumadin Indication: atrial fibrillation and Mechanical valve  Vital Signs: Temp: 98.6 F (37 C) (09/16 0835) Temp Source: Oral (09/16 0835) BP: 118/94 (09/16 1200) Pulse Rate: 75 (09/16 1200)  Labs: Recent Labs    02/01/2019 2211  01/09/2019 2250 01/24/19 0224 01/24/19 0259 01/24/19 0316 01/24/19 0738 01/24/19 0807 01/24/19 1116  HGB 12.6   < > 15.0 12.9  --  11.7*  --   --   --   HCT 41.6   < > 44.0 38.0  --  37.1  --   --   --   PLT 82*  --   --   --   --  68*  --   --   --   LABPROT 24.2*  --   --   --   --   --   --   --  28.8*  INR 2.2*  --   --   --   --   --   --   --  2.8*  CREATININE 1.17*  --  1.00  --   --  1.38*  --   --   --   TROPONINIHS 20*  --   --   --  132*  --  111* 113*  --    < > = values in this interval not displayed.    CrCl cannot be calculated (Unknown ideal weight.).  Assessment: 68 y.o. female admitted with CHF/SOB, h/o Afib and mechanical MVR, to continue Coumadin. INR today is therapeutic at 2.8. No bleeding noted.  Of note, pt outpatient INR goal noted to be 2-3. However, she does have a mechanical mitral valve along with afib which would require an INR goal of 2.5-3.5. Per discussion with MD, will proceed with an INR goal of 2.5-3.5 for now. This may require a change to her outpatient warfarin regimen at discharge.   Goal of Therapy:  INR 2.5-3.5 Monitor platelets by anticoagulation protocol: Yes   Plan:  Warfarin 4mg  PO x 1 tonight Daily INR  Wirt Hemmerich, Rande Lawman 01/24/2019,12:30 PM

## 2019-01-24 NOTE — H&P (Signed)
History and Physical    Marie Malone PPJ:[id number] DOB: 10-10-1950 DOA: 02/03/2019  PCP: Debbrah Alar, NP  Patient coming from: Home.  Chief Complaint: Shortness of breath.  HPI: Marie Malone is a 68 y.o. 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  Patient states over the last 2 weeks patient has been getting progressively short of breath on exertion.  Denies any chest pain fever chills or productive cough.  Patient's primary care physician advised her to increase her Lasix twice daily which she is supposed to start today.  Because of worsening shortness of breath patient came to the ER.  EMS was called and patient initially was placed on BiPAP.  Patient also had complained of some left calf cramping.  ED Course: In the ER patient was hypoxic placed on BiPAP chest x-ray shows features consistent with CHF.  Lab work show a BNP of 1699, sodium 139 potassium 4.2 creatinine 1.1 WBC count of 7.6 hemoglobin 12.6 platelets 82 and EKG was showing A. fib rate at 106 bpm.  Patient was given 60 mg IV Lasix following which patient was able to be weaned off BiPAP and presently is on 5 L oxygen.  Patient admitted for further management of acute CHF.  COVID-19 test was negative.  On exam patient has good peripheral pulses with some edema in the lower extremity.  Review of Systems: As per HPI, rest all negative.   Past Medical History:  Diagnosis Date  . Atrial fibrillation ([place])   . Blood transfusion without reported diagnosis 05/2016  . Chronic diastolic CHF (congestive heart failure) ([place])   . Clotting disorder ([place]) 2014   placed on Warfarin  . Complication of anesthesia    difficult to wake up from anesthesia  . Coronary artery disease involving native coronary artery without angina pectoris 03/26/2016   100% chronic  occlusion of RCA  . Dyspnea   . Heart murmur   . History of rheumatic fever    as a child  . Hyperlipidemia   . Hypertension   . Mitral stenosis   . OSA (obstructive sleep apnea)    mild per sleep study 2008  . Paroxysmal atrial fibrillation ([facility])   . Pneumonia    double pneumonia once  . Pulmonary hypertension ([place])   . Rheumatic mitral regurgitation   . S/P balloon mitral valvuloplasty 2008   . 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 S/P mitral valve replacement with mechanical valve 05/28/2016   29 mm Sorin Carbomedics Optiform bileaflet mechanical prosthetic valve  . Stroke [facility])    x2    Past Surgical History:  Procedure Laterality Date  . APPENDECTOMY  1989   appendix ruptured,had peritonitis  . BALLOON VALVULOPLASTY  2008   [facility]  . CARDIAC CATHETERIZATION N/A 03/25/2016   Procedure: Right/Left Heart Cath and Coronary Angiography;  Surgeon: Jettie Booze, MD;  Location: [place] [facility];  Service: Cardiovascular;  Laterality: N/A;  . COLONOSCOPY W/ POLYPECTOMY    . CORONARY ARTERY BYPASS GRAFT N/A 05/28/2016   Procedure: CORONARY ARTERY BYPASS GRAFTING (CABG) x 1 WITH ENDOSCOPIC HARVESTING OF RIGHT SAPHENOUS VEIN, EVH- PDA;  Surgeon: Rexene Alberts, MD;  Location: [place];  Service: Open Heart Surgery;  Laterality: N/A;  . ENDARTERECTOMY Right 03/10/2016   Procedure: ENDARTERECTOMY CAROTID RIGHT;  Surgeon: Serafina Mitchell, MD;  Location: [place];  Service: Vascular;  Laterality: Right;  . EP IMPLANTABLE DEVICE N/A 03/11/2016   Procedure: Loop Recorder Removal;  Surgeon: Deboraha Sprang, MD;  Location: [place] [facility];  Service: Cardiovascular;  Laterality: N/A;  . 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 LOOP RECORDER IMPLANT N/A 04/10/2013   Procedure: LOOP RECORDER IMPLANT;  Surgeon: Coralyn Mark, MD;  Location: [place] CATH LAB;  Service: Cardiovascular;  Laterality: N/A;  . MAZE N/A  05/28/2016   Procedure: MAZE PROCEDURE AND APPLICATION OF  ATRICLIP LAA PROCLIP II 72 MM;  Surgeon: Rexene Alberts, MD;  Location: [place];  Service: Open Heart Surgery;  Laterality: N/A;  . MITRAL VALVE REPLACEMENT N/A 05/28/2016   Procedure: MITRAL VALVE (MV) REPLACEMENT USING 29 MM CARBOMEDICS OPTIFORM MECHANICAL BILEAFLET PROSTHETIC HEART VALVE;  Surgeon: Rexene Alberts, MD;  Location: [place];  Service: Open Heart Surgery;  Laterality: N/A;  . MULTIPLE EXTRACTIONS WITH ALVEOLOPLASTY N/A 03/30/2016   Procedure: EXTRACTION OF TOOTH #'S 2,5,6,11,15,16,20-30 AND 32 WITH ALVEOLOPLASTY AND BILATERAL MAXILLARY LATERAL EXOSTOSES REDUCTIONS;  Surgeon: Lenn Cal, DDS;  Location: [place];  Service: Oral Surgery;  Laterality: N/A;  . PATCH ANGIOPLASTY Right 03/10/2016   Procedure: PATCH ANGIOPLASTY CAROTID RIGHT USING Rueben Bash BIOLOGIC PATCH;  Surgeon: Serafina Mitchell, MD;  Location: [place];  Service: Vascular;  Laterality: Right;  . TEE WITHOUT CARDIOVERSION N/A 04/10/2013   Procedure: TRANSESOPHAGEAL ECHOCARDIOGRAM (TEE);  Surgeon: [name], MD;  Location[facility] ENDOSCOPY;  Service: Cardiovascular;  Laterality: N/A;  . TEE WITHOUT CARDIOVERSION N/A 03/24/2016   Procedure: TRANSESOPHAGEAL ECHOCARDIOGRAM (TEE);  Surgeon: Jerline Pain, MD;  Location: [place];  Service: Cardiovascular;  Laterality: N/A;  . TEE WITHOUT CARDIOVERSION N/A 05/28/2016   Procedure: TRANSESOPHAGEAL ECHOCARDIOGRAM (TEE);  Surgeon: Rexene Alberts, MD;  Location: [place];  Service: Open Heart Surgery;  Laterality: N/A;  . TUBAL LIGATION       reports that she quit smoking about 43 years ago. Her smoking use included cigarettes. She started smoking about 48 years ago. She has a 5.00 pack-year smoking history. She has never used smokeless tobacco. She reports that she does not drink alcohol or use drugs.  Allergies  Allergen Reactions  . No Known Allergies     Family History  Problem Relation Age of Onset  . Diabetes  Mother   . Stroke Mother   . Diabetes Father   . Heart disease Father        CHF  . Cancer Father        kidney  . Diabetes Sister   . Diabetes Brother   . Hypertension Daughter   . Colon polyps Neg Hx   . Colon cancer Neg Hx   . Esophageal cancer Neg Hx   . Rectal cancer Neg Hx   . Stomach cancer Neg Hx     Prior to Admission medications   Medication Sig Start Date End Date Taking? Authorizing Provider  aspirin EC 81 MG EC tablet Take 1 tablet (81 mg total) by mouth daily. 06/05/16   Gold, Wilder Glade, PA-C  atorvastatin (LIPITOR) 80 MG tablet Take 0.5 tablets (40 mg total) by mouth daily at 6 PM. 07/25/18   Debbrah Alar, NP  betamethasone dipropionate (DIPROLENE) 0.05 % cream Apply topically 2 (two) times daily. 08/01/18   Debbrah Alar, NP  diphenhydrAMINE (BENADRYL) 25 mg capsule Take 25  mg by mouth at bedtime as needed for itching.    [provider]  furosemide (LASIX) 20 MG tablet Take 2 tabs by mouth in the AM and 1 tab by mouth in the PM Patient taking differently: Take 40 mg by mouth daily. Take 2 tabs by mouth in the AM and 1 tab by mouth in the PM 07/25/18   Debbrah Alar, NP  loratadine (CLARITIN) 10 MG tablet Take 10 mg by mouth daily.    [provider]  losartan (COZAAR) 25 MG tablet TAKE 1 TABLET BY MOUTH ONCE DAILY *NEED OFFICE VISIT* 10/25/18   Debbrah Alar, NP  metoprolol succinate (TOPROL-XL) 50 MG 24 hr tablet TAKE 1 TABLET BY MOUTH ONCE DAILY WITH FOOD OR IMMEDIATELY FOLLOWING A MEAL . APPOINTMENT REQUIRED FOR FUTURE REFILLS 01/10/19   Debbrah Alar, NP  potassium chloride (K-DUR) 10 MEQ tablet Take 1 tablet (10 mEq total) by mouth 2 (two) times daily. Take 1 tab daily and a 2nd tab daily prn when you take a 2nd Furosemide tab in a day 07/25/18   Debbrah Alar, NP  ursodiol (ACTIGALL) 500 MG tablet Take 1 tablet (500 mg total) by mouth 2 (two) times daily. 07/26/18   Irene Shipper, MD  warfarin (COUMADIN) 4 MG tablet TAKE  1 TABLET BY MOUTH ONCE DAILY OR  AS  DIRECTED  BY  COUMADIN  CLINIC 11/16/18   [name], MD    Physical Exam: Constitutional: Moderately built and nourished. Vitals:   01/24/19 0102 01/24/19 0209 01/24/19 0215 01/24/19 0230  BP:   (!) 116/55 (!) 115/58  Pulse:  75 75   Resp:  19 (!) 22 (!) 22  Temp:      TempSrc:      SpO2: 100% 100% 99%    Eyes: Anicteric no pallor. ENMT: No discharge from the ears eyes nose or mouth. Neck: JVD elevated no mass felt. Respiratory: No rhonchi or crepitations. Cardiovascular: S1-S2 heard. Abdomen: Soft nontender bowel sound present. Musculoskeletal: Mild edema both lower extremities. Skin: No rash. Neurologic: Alert awake oriented to time place and person.  Moves all extremities. Psychiatric: Appears normal per normal affect.   Labs on Admission: I have personally reviewed following labs and imaging studies  CBC: Recent Labs  Lab 01/10/2019 2211 02/01/2019 2234 01/10/2019 2250 01/24/19 0224  WBC 7.6  --   --   --   NEUTROABS 5.7  --   --   --   HGB 12.6 14.3 15.0 12.9  HCT 41.6 42.0 44.0 38.0  MCV 97.7  --   --   --   PLT 82*  --   --   --    Basic Metabolic Panel: Recent Labs  Lab 01/25/2019 2211 02/06/2019 2234 01/15/2019 2250 01/24/19 0224  NA 139 143 144 142  K 4.2 4.5 4.2 4.2  CL 106  --  110  --   CO2 21*  --   --   --   GLUCOSE 92  --  85  --   BUN 21  --  25*  --   CREATININE 1.17*  --  1.00  --   CALCIUM 8.8*  --   --   --    GFR: CrCl cannot be calculated (Unknown ideal weight.). Liver Function Tests: Recent Labs  Lab 02/06/2019 2211  AST 47*  ALT 19  ALKPHOS 60  BILITOT 1.5*  PROT 9.6*  ALBUMIN 3.4*   No results for input(s): LIPASE, AMYLASE in the last 168  hours. No results for input(s): AMMONIA in the last 168 hours. Coagulation Profile: Recent Labs  Lab 01/14/2019 2211  INR 2.2*   Cardiac Enzymes: No results for input(s): CKTOTAL, CKMB, CKMBINDEX, TROPONINI in the last 168 hours. BNP (last 3 results)  No results for input(s): PROBNP in the last 8760 hours. HbA1C: No results for input(s): HGBA1C in the last 72 hours. CBG: No results for input(s): GLUCAP in the last 168 hours. Lipid Profile: No results for input(s): CHOL, HDL, LDLCALC, TRIG, CHOLHDL, LDLDIRECT in the last 72 hours. Thyroid Function Tests: No results for input(s): TSH, T4TOTAL, FREET4, T3FREE, THYROIDAB in the last 72 hours. Anemia Panel: No results for input(s): VITAMINB12, FOLATE, FERRITIN, TIBC, IRON, RETICCTPCT in the last 72 hours. Urine analysis:    Component Value Date/Time   COLORURINE YELLOW 05/15/2018 1525   APPEARANCEUR CLEAR 05/15/2018 1525   LABSPEC 1.010 05/15/2018 1525   PHURINE 6.0 05/15/2018 1525   GLUCOSEU NEGATIVE 05/15/2018 1525   HGBUR NEGATIVE 05/15/2018 1525   BILIRUBINUR NEGATIVE 05/15/2018 1525   BILIRUBINUR NEG 11/07/2017 1013   KETONESUR NEGATIVE 05/15/2018 1525   PROTEINUR Positive (A) 11/07/2017 1013   PROTEINUR NEGATIVE 05/28/2016 0700   UROBILINOGEN 1.0 05/15/2018 1525   NITRITE NEGATIVE 05/15/2018 1525   LEUKOCYTESUR NEGATIVE 05/15/2018 1525   Sepsis Labs: @LABRCNTIP (procalcitonin:4,lacticidven:4) ) Recent Results (from the past 240 hour(s))  SARS Coronavirus 2 [facility] order, Performed in [facility] hospital lab) Nasopharyngeal Nasopharyngeal Swab     Status: None   Collection Time: 01/16/2019 10:12 PM   Specimen: Nasopharyngeal Swab  Result Value Ref Range Status   SARS Coronavirus 2 NEGATIVE NEGATIVE Final    Comment: (NOTE) If result is NEGATIVE SARS-CoV-2 target nucleic acids are NOT DETECTED. The SARS-CoV-2 RNA is generally detectable in upper and lower  respiratory specimens during the acute phase of infection. The lowest  concentration of SARS-CoV-2 viral copies this assay can detect is 250  copies / mL. A negative result does not preclude SARS-CoV-2 infection  and should not be used as the sole basis for treatment or other  patient management decisions.  A  negative result may occur with  improper specimen collection / handling, submission of specimen other  than nasopharyngeal swab, presence of viral mutation(s) within the  areas targeted by this assay, and inadequate number of viral copies  (<250 copies / mL). A negative result must be combined with clinical  observations, patient history, and epidemiological information. If result is POSITIVE SARS-CoV-2 target nucleic acids are DETECTED. The SARS-CoV-2 RNA is generally detectable in upper and lower  respiratory specimens dur ing the acute phase of infection.  Positive  results are indicative of active infection with SARS-CoV-2.  Clinical  correlation with patient history and other diagnostic information is  necessary to determine patient infection status.  Positive results do  not rule out bacterial infection or co-infection with other viruses. If result is PRESUMPTIVE POSTIVE SARS-CoV-2 nucleic acids MAY BE PRESENT.   A presumptive positive result was obtained on the submitted specimen  and confirmed on repeat testing.  While 2019 novel coronavirus  (SARS-CoV-2) nucleic acids may be present in the submitted sample  additional confirmatory testing may be necessary for epidemiological  and / or clinical management purposes  to differentiate between  SARS-CoV-2 and other Sarbecovirus currently known to infect humans.  If clinically indicated additional testing with an alternate test  methodology [phone]) is advised. The SARS-CoV-2 RNA is generally  detectable in upper and lower respiratory sp ecimens during the  acute  phase of infection. The expected result is Negative. Fact Sheet for Patients:  StrictlyIdeas.no Fact Sheet for Healthcare Providers: BankingDealers.co.za This test is not yet approved or cleared by the [place] FDA and has been authorized for detection and/or diagnosis of SARS-CoV-2 by FDA under an Emergency Use  Authorization (EUA).  This EUA will remain in effect (meaning this test can be used) for the duration of the COVID-19 declaration under Section 564(b)(1) of the Act, 21 U.S.C. section 360bbb-3(b)(1), unless the authorization is terminated or revoked sooner. Performed at [facility], [place] [address], [place], [place] [address]      Radiological Exams on Admission: Dg Chest Port 1 View  Result Date: 01/22/2019 CLINICAL DATA:  Shortness of breath, patient on BiPAP EXAM: PORTABLE CHEST 1 VIEW COMPARISON:  Radiograph 07/05/2016 FINDINGS: Postsurgical changes related to prior CABG including intact and aligned sternotomy wires and multiple surgical clips projecting over the mediastinum. Aortic valve replacement is noted. Atrial occlusion clip is present. Borderline cardiomegaly. Small right pleural effusion is present. There is diffuse interstitial opacities throughout the lungs, increased from prior studies. Central peribronchovascular thickening is noted as well with cephalized, indistinct vascularity. No focal consolidative process. No pneumothorax. No acute osseous or soft tissue abnormality. IMPRESSION: Findings favor CHF/volume overload with mild cardiomegaly, interstitial pulmonary edema with small right pleural effusion. Electronically Signed   By: Lovena Le M.D.   On: 01/14/2019 22:22    EKG: Independently reviewed.  A. fib rate of 106 bpm.  Assessment/Plan Principal Problem:   Acute respiratory failure with hypoxia (HCC) Active Problems:   Essential hypertension   Pulmonary hypertension (HCC)   Paroxysmal atrial fibrillation ([facility])   S/P mitral valve replacement with mechanical valve + CABG x1 + Maze procedure   S/P CABG x 1    1. Acute respiratory failure with hypoxia secondary to acute on chronic systolic heart failure last EF measured was 45 to 50% in April 2018 -patient has a known history of mechanical mitral valve.  I have placed patient on Lasix 40 mg IV every 12 has  received 60 mg IV in the ER.  Will get cardiology consult for further recommendation.  Closely follow intake output metabolic panel and daily weights.  Patient is on ARB and Toprol-XL. 2. A. fib rate mildly elevated presently improving at the time of my exam.  On Coumadin per pharmacy and Toprol-XL. 3. History of mechanical mitral valve on Coumadin per pharmacy.  See 1.  May need repeat 2D echo cardiology has been consulted. 4. History of CAD status post CABG denies any chest pain.  On statins aspirin Coumadin Toprol-XL. 5. 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 6. Thrombocytopenia likely from cirrhosis follow CBC. 7. Peripheral vascular disease -has good pulses on exam.  Given that patient presented with hypoxia requiring at least 5 L oxygen at this time with history of mechanical mitral valve and other comorbidities patient will need close follow-up and will need at least more than 2 midnight stay and will need inpatient status.   DVT prophylaxis: Coumadin. Code Status: Full code. Family Communication: Patient's sister. Disposition Plan: Home. Consults called: Cardiology. Admission status: Inpatient.   Rise Patience MD [facility] Pager [phone].  If 7PM-7AM, please contact night-coverage [email or web address] Password [facility]  01/24/2019, 3:18 AM

## 2019-01-24 NOTE — Progress Notes (Addendum)
NP on call notified of pt's bp beoing 90/51. Pt has orders for 40mg  of IV lasix. RN will hold & await any new orders.[name] lungs sounds are clear.  Hoover Brunette, RN  Care instruction to recheck bp & if sbp >110 okay to give lasix. bp rechecked & sbp<110 will continue to hold lasix & pass on information to oncoming nurse. Hoover Brunette, RN

## 2019-01-24 NOTE — Consult Note (Addendum)
Cardiology Consultation:   Patient ID: Marie Malone [id number]; January 23, 1951   Admit date: 01/27/2019 Date of Consult: 01/24/2019  Primary Care Provider: Debbrah Alar, NP Primary Cardiologist: Dr. [name], MD  Patient Profile:   Marie Malone is a 68 y.o. 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  History of Present Illness:   Marie Malone is a 68 year old female with a history stated above who presented to [facility] with complaints of progressive shortness of breath for the last 2 weeks. Patient reports dyspnea has been worse with exertion. She is fairly sedentary at baseline however has noticed that over the last several weeks it has been more difficult to ambulate within her home and at the grocery store. She was previously seen by her PCP several months ago for similar symptoms in which her Lasix was initially increased to 60mg  QD however after resolution, this was ultimately decreased to 40mg  QD. She weighs herself daily and reports that her baseline weight typically is in the 130lb range. She states that over the last two weeks her weight has been more like 140lb. She states that when she weighed yesterday, her weight was 145lb. She does report some diet indiscretions with sodium. She denies chest pain. She has had symptoms of orthopnea. She reports LE pain, more in the left leg.   On ED arrival, patient was placed on BiPAP ventilation secondary to hypoxia. CXR showed findings which favor CHF/volume overload with mild cardiomegaly, interstitial pulmonary edema with small right pleural effusion. BNP noted to be elevated at 1699.  Creatinine was elevated at 1.3. There was  no leukocytosis.  She was given 60 mg IV Lasix in the emergency department with response. She was able to be weaned off BiPAP and was placed on 5 L nasal cannula with adequate saturations. She was admitted to internal medicine for acute CHF exacerbation.  COVID testing was negative.  She was last seen by her primary cardiologist, Dr. Stanford Malone on 11/15/2018 via virtual visit for follow-up of mitral valve replacement, PAF and CAD.  At that time, she had complaints of worsening dyspnea with exertion however not with routine activities that was relieved with rest.  She had no anginal symptoms, no orthopnea, PND or pedal edema.  No changes were made at that time.  Patient underwent valvuloplasty at [facility] in 2008. Prior loop recorder revealed paroxysmal atrial fibrillation. She underwent carotid endarterectomy 03/2016. TEE 03/2016 showed normal LV function, moderate to severe mitral stenosis, mild left atrial enlargement. Cardiac catheterization performed 03/2016 showed normal LV systolic function, moderate mitral stenosis with mean gradient 10 mmHg, 100% right coronary artery with left-to-right collaterals and moderate pulmonary hypertension.  She had mechanical MVR, CABG with SVG to PDA and maze procedure performed 05/2016. Echocardiogram following in 08/2016 showed LVEF of 45 to 50%, mechanical mitral valve, moderate left atrial enlargement, severe right atrial enlargement, mild right ventricular enlargement. ABIs performed 09/2017 were normal however right and left toe brachial index were abnormal.  Carotid Dopplers performed 03/2018 showed 1 to 39% bilateral stenosis.  Past Medical History:  Diagnosis Date   Atrial fibrillation ([place])    Blood transfusion without reported diagnosis 05/2016   Chronic diastolic CHF (congestive heart  failure) ([place])    Clotting disorder ([place]) 2014   placed on Warfarin   Complication of anesthesia    difficult to wake up from anesthesia   Coronary artery disease involving  native coronary artery without angina pectoris 03/26/2016   100% chronic occlusion of RCA   Dyspnea    Heart murmur    History of rheumatic fever    as a child   Hyperlipidemia    Hypertension    Mitral stenosis    OSA (obstructive sleep apnea)    mild per sleep study 2008   Paroxysmal atrial fibrillation ([place])    Pneumonia    double pneumonia once   Pulmonary hypertension (HCC)    Rheumatic mitral regurgitation    S/P balloon mitral valvuloplasty 2008    S/P CABG x 1 05/28/2016   SVG to PDA with [facility] via right thigh   S/P mitral valve replacement with mechanical valve 05/28/2016   29 mm Sorin Carbomedics Optiform bileaflet mechanical prosthetic valve   Stroke ([place])    x2    Past Surgical History:  Procedure Laterality Date   APPENDECTOMY  1989   appendix ruptured,had peritonitis   BALLOON VALVULOPLASTY  2008   [place] N/A 03/25/2016   Procedure: Right/Left Heart Cath and Coronary Angiography;  Surgeon: Marie Booze, MD;  Location: [place] CV LAB;  Service: Cardiovascular;  Laterality: N/A;   COLONOSCOPY W/ POLYPECTOMY     CORONARY ARTERY BYPASS GRAFT N/A 05/28/2016   Procedure: CORONARY ARTERY BYPASS GRAFTING (CABG) x 1 WITH ENDOSCOPIC HARVESTING OF RIGHT SAPHENOUS VEIN, EVH- PDA;  Surgeon: Marie Alberts, MD;  Location: [place];  Service: Open Heart Surgery;  Laterality: N/A;   ENDARTERECTOMY Right 03/10/2016   Procedure: ENDARTERECTOMY CAROTID RIGHT;  Surgeon: Marie Mitchell, MD;  Location: [place];  Service: Vascular;  Laterality: Right;   EP IMPLANTABLE DEVICE N/A 03/11/2016   Procedure: Loop Recorder Removal;  Surgeon: Marie Sprang, MD;  Location: [place] CV LAB;  Service: Cardiovascular;  Laterality: N/A;   LOOP RECORDER IMPLANT  04-10-2013   MDT LinQ implanted by Dr Marie Malone for cryptogenic stroke   LOOP RECORDER IMPLANT N/A 04/10/2013   Procedure: LOOP RECORDER IMPLANT;  Surgeon: Marie Mark, MD;  Location: [place]  CATH LAB;  Service: Cardiovascular;  Laterality: N/A;   MAZE N/A 05/28/2016   Procedure: MAZE PROCEDURE AND APPLICATION OF  ATRICLIP LAA PROCLIP II 9 MM;  Surgeon: Marie Alberts, MD;  Location: [place];  Service: Open Heart Surgery;  Laterality: N/A;   MITRAL VALVE REPLACEMENT N/A 05/28/2016   Procedure: MITRAL VALVE (MV) REPLACEMENT USING 29 MM CARBOMEDICS OPTIFORM MECHANICAL BILEAFLET PROSTHETIC HEART VALVE;  Surgeon: Marie Alberts, MD;  Location: [place];  Service: Open Heart Surgery;  Laterality: N/A;   MULTIPLE EXTRACTIONS WITH ALVEOLOPLASTY N/A 03/30/2016   Procedure: EXTRACTION OF TOOTH #'S 2,5,6,11,15,16,20-30 AND 32 WITH ALVEOLOPLASTY AND BILATERAL MAXILLARY LATERAL EXOSTOSES REDUCTIONS;  Surgeon: Lenn Cal, DDS;  Location: [place];  Service: Oral Surgery;  Laterality: N/A;   PATCH ANGIOPLASTY Right 03/10/2016   Procedure: PATCH ANGIOPLASTY CAROTID RIGHT USING Rueben Bash BIOLOGIC PATCH;  Surgeon: Marie Mitchell, MD;  Location: [place];  Service: Vascular;  Laterality: Right;   TEE WITHOUT CARDIOVERSION N/A 04/10/2013   Procedure: TRANSESOPHAGEAL ECHOCARDIOGRAM (TEE);  Surgeon: [name], MD;  Location: [facility] ENDOSCOPY;  Service: Cardiovascular;  Laterality: N/A;   TEE WITHOUT CARDIOVERSION N/A 03/24/2016   Procedure: TRANSESOPHAGEAL ECHOCARDIOGRAM (TEE);  Surgeon:  Jerline Pain, MD;  Location: [facility] ENDOSCOPY;  Service: Cardiovascular;  Laterality: N/A;   TEE WITHOUT CARDIOVERSION N/A 05/28/2016   Procedure: TRANSESOPHAGEAL ECHOCARDIOGRAM (TEE);  Surgeon: Marie Alberts, MD;  Location: [place];  Service: Open Heart Surgery;  Laterality: N/A;   TUBAL LIGATION       Prior to Admission medications   Medication Sig Start Date End Date Taking? Authorizing Provider  aspirin EC 81 MG EC tablet Take 1 tablet (81 mg total) by mouth daily. 06/05/16  Yes Gold, Wayne E, PA-C  atorvastatin (LIPITOR) 80 MG tablet Take 0.5 tablets (40 mg total) by mouth daily at 6 PM. 07/25/18  Yes Marie Alar, NP  diphenhydrAMINE (BENADRYL) 25 mg capsule Take 25 mg by mouth at bedtime as needed for itching.   Yes [provider]  furosemide (LASIX) 20 MG tablet Take 2 tabs by mouth in the AM and 1 tab by mouth in the PM Patient taking differently: Take 40 mg by mouth daily. Take 2 tabs by mouth in the AM and 1 tab by mouth in the PM 07/25/18  Yes Marie Alar, NP  loratadine (CLARITIN) 10 MG tablet Take 10 mg by mouth daily.   Yes [provider]  losartan (COZAAR) 25 MG tablet TAKE 1 TABLET BY MOUTH ONCE DAILY *NEED OFFICE VISIT* 10/25/18  Yes Marie Alar, NP  metoprolol succinate (TOPROL-XL) 50 MG 24 hr tablet TAKE 1 TABLET BY MOUTH ONCE DAILY WITH FOOD OR IMMEDIATELY FOLLOWING A MEAL . APPOINTMENT REQUIRED FOR FUTURE REFILLS 01/10/19  Yes Marie Alar, NP  potassium chloride (K-DUR) 10 MEQ tablet Take 1 tablet (10 mEq total) by mouth 2 (two) times daily. Take 1 tab daily and a 2nd tab daily prn when you take a 2nd Furosemide tab in a day 07/25/18  Yes Marie Alar, NP  ursodiol (ACTIGALL) 500 MG tablet Take 1 tablet (500 mg total) by mouth 2 (two) times daily. 07/26/18  Yes Irene Shipper, MD  warfarin (COUMADIN) 4 MG tablet TAKE 1 TABLET BY MOUTH ONCE DAILY OR  AS  DIRECTED  BY  COUMADIN  CLINIC 11/16/18  Yes [name], MD    Inpatient Medications: Scheduled Meds:  aspirin EC  81 mg Oral Daily   atorvastatin  40 mg Oral q1800   furosemide  40 mg Intravenous Q12H   losartan  25 mg Oral Daily   metoprolol succinate  50 mg Oral Daily   potassium chloride  10 mEq Oral BID   ursodiol  300 mg Oral BID   Warfarin - Pharmacist Dosing Inpatient   Does not apply q1800   Continuous Infusions:  PRN Meds: acetaminophen **OR** acetaminophen, ondansetron **OR** ondansetron (ZOFRAN) IV  Allergies:    Allergies  Allergen Reactions   No Known Allergies     Social History:   Social History   Socioeconomic History   Marital status:  Married    Spouse name: Not on file   Number of children: 2   Years of education: Associates   Highest education level: Not on file  Occupational History   Not on file  Social Needs   Financial resource strain: Not on file   Food insecurity    Worry: Not on file    Inability: Not on file   Transportation needs    Medical: Not on file    Non-medical: Not on file  Tobacco Use   Smoking status: Former Smoker    Packs/day: 1.00    Years: 5.00  Pack years: 5.00    Types: Cigarettes    Start date: 07/15/1970    Quit date: 05/11/1975    Years since quitting: 43.7   Smokeless tobacco: Never Used   Tobacco comment: quit smoking 36 years ago  Substance and Sexual Activity   Alcohol use: No    Alcohol/week: 0.0 standard drinks   Drug use: No   Sexual activity: Not on file  Lifestyle   Physical activity    Days per week: Not on file    Minutes per session: Not on file   Stress: Not on file  Relationships   Social connections    Talks on phone: Not on file    Gets together: Not on file    Attends religious service: Not on file    Active member of club or organization: Not on file    Attends meetings of clubs or organizations: Not on file    Relationship status: Not on file   Intimate partner violence    Fear of current or ex partner: Not on file    Emotionally abused: Not on file    Physically abused: Not on file    Forced sexual activity: Not on file  Other Topics Concern   Not on file  Social History Narrative   Retired Archivist   Married   She has 2 grown children-   Daughter lives in [place]   Son lives in [place]   6 grandchildren     Family History:   Family History  Problem Relation Age of Onset   Diabetes Mother    Stroke Mother    Diabetes Father    Heart disease Father        CHF   Cancer Father        kidney   Diabetes Sister    Diabetes Brother    Hypertension Daughter    Colon polyps Neg Hx    Colon  cancer Neg Hx    Esophageal cancer Neg Hx    Rectal cancer Neg Hx    Stomach cancer Neg Hx    Family Status:  Family Status  Relation Name Status   Mother  Deceased   Father  Deceased   Sister  Alive   Brother  Alive   Daughter  Alive   Son  Alive   MGM  Deceased   MGF  Deceased   PGM  Deceased   PGF  Deceased   Sister  Alive   Sister  Alive   Brother  Alive   Neg Hx  (Not Specified)    ROS:  Please see the history of present illness.  All other ROS reviewed and negative.     Physical Exam/Data:   Vitals:   01/24/19 0930 01/24/19 0945 01/24/19 1000 01/24/19 1015  BP: (!) 145/69 140/66 134/81 132/75  Pulse:  72  75  Resp: (!) 23 (!) 25 (!) 24 (!) 26  Temp:      TempSrc:      SpO2:  99%  99%   No intake or output data in the 24 hours ending 01/24/19 1057 There were no vitals filed for this visit. There is no height or weight on file to calculate BMI.   General: Elderly, NAD Neck: Negative for carotid bruits. No JVD Lungs: Bilateral LL, diminished. No wheezes. Breathing is mildly labored. On [place]  Cardiovascular: RRR with S1 S2. + murmur. Mechanical valve  Abdomen: Soft, non-tender, non-distended. No obvious abdominal masses. Extremities: Mild 1+  LE edema. No clubbing or cyanosis. DP dopplerable Neuro: Alert and oriented. No focal deficits. No facial asymmetry. MAE spontaneously. Psych: Responds to questions appropriately with normal affect.     EKG:  The EKG was personally reviewed and demonstrates: 01/24/2019 Atrial fibrillation with HR 106 and no ischemic changes  Telemetry:  Telemetry was personally reviewed and demonstrates: 01/24/2019 AF HR 80-90's  Relevant CV Studies:  Echocardiogram 08/18/2016: Study Conclusions  - Left ventricle: The cavity size was normal. Systolic function was   mildly reduced. The estimated ejection fraction was in the range   of 45% to 50%. Diffuse hypokinesis. - Ventricular septum: The contour showed diastolic  flattening. - Aortic valve: Moderately thickened, moderately calcified   leaflets. Cusp separation was reduced. - Mitral valve: A mechanical prosthesis was present. Well seated   and functioning normally. Valve area by continuity equation   (using LVOT flow): 0.6 cm^2. - Left atrium: The atrium was moderately dilated. - Right ventricle: The cavity size was mildly dilated. Wall   thickness was normal. - Right atrium: The atrium was severely dilated. - Pulmonary arteries: Systolic pressure was mildly increased. PA   peak pressure: 42 mm Hg (S).  Impressions:  - When compared to prior, mechanical MV is new, EF is reduced.  Echocardiogram 03/23/2016:  Study Conclusions  - Left ventricle: The cavity size was normal. Wall thickness was   increased in a pattern of mild LVH. Systolic function was normal.   The estimated ejection fraction was in the range of 55% to 60%.   Wall motion was normal; there were no regional wall motion   abnormalities. Doppler parameters are consistent with high   ventricular filling pressure. - Ventricular septum: The contour showed diastolic flattening and   systolic flattening. - Mitral valve: Calcified annulus. Severely thickened leaflets .   The findings are consistent with severe stenosis. There was mild   regurgitation. Valve area by pressure half-time: 1.48 cm^2. - Left atrium: The atrium was severely dilated. - Atrial septum: The septum bowed from left to right, consistent   with increased left atrial pressure. - Pericardium, extracardiac: A small pericardial effusion was   identified.  Impressions:  - Normal LV systolic function; mild LVH; elevated LV filling   pressure; calcified aortic valve with no significant AS;   thickened, calcified MV with probable severe MS (MVA by pressure   halftime 1.48 cm); severe LAE.  Right and left cardiac catheterization 03/25/2016:   left ventricular systolic function is normal.  The left  ventricular ejection fraction is 55-65% by visual estimate.  There is no aortic valve stenosis.  There is moderate mitral valve stenosis. Mean gradient 10 mm Hg. MV area 1.3 cm2  Mid Cx to Dist Cx lesion, 10 %stenosed.  Ost 1st Mrg to 1st Mrg lesion, 20 %stenosed.  Prox RCA lesion, 100 %stenosed. Left to right and right to right collaterals are present.  Hemodynamic findings consistent with moderate pulmonary hypertension and mitral valve stenosis.  LV end diastolic pressure is normal.   Further plans per inpatient team.  D/w Dr. Sallyanne Kuster.  Will obtain surgical evaluation for mitral valve repair.  Resume heparin 6 hours post sheath pull.   Laboratory Data:  Chemistry Recent Labs  Lab 02/02/2019 2211  01/15/2019 2250 01/24/19 0224 01/24/19 0316  NA 139   < > 144 142 139  K 4.2   < > 4.2 4.2 3.9  CL 106  --  110  --  109  CO2 21*  --   --   --  17*  GLUCOSE 92  --  85  --  106*  BUN 21  --  25*  --  24*  CREATININE 1.17*  --  1.00  --  1.38*  CALCIUM 8.8*  --   --   --  8.6*  GFRNONAA 48*  --   --   --  39*  GFRAA 56*  --   --   --  46*  ANIONGAP 12  --   --   --  13   < > = values in this interval not displayed.    Total Protein  Date Value Ref Range Status  01/24/2019 8.6 (H) 6.5 - 8.1 g/dL Final  03/28/2017 8.3 6.0 - 8.5 g/dL Final   Albumin  Date Value Ref Range Status  01/24/2019 3.1 (L) 3.5 - 5.0 g/dL Final  03/28/2017 4.2 3.6 - 4.8 g/dL Final   AST  Date Value Ref Range Status  01/24/2019 54 (H) 15 - 41 U/L Final   ALT  Date Value Ref Range Status  01/24/2019 18 0 - 44 U/L Final   Alkaline Phosphatase  Date Value Ref Range Status  01/24/2019 53 38 - 126 U/L Final   Total Bilirubin  Date Value Ref Range Status  01/24/2019 1.8 (H) 0.3 - 1.2 mg/dL Final   Bilirubin Total  Date Value Ref Range Status  03/28/2017 1.2 0.0 - 1.2 mg/dL Final   Hematology Recent Labs  Lab 01/29/2019 2211  01/12/2019 2250 01/24/19 0224 01/24/19 0316  WBC 7.6  --   --    --  10.9*  RBC 4.26  --   --   --  4.03  HGB 12.6   < > 15.0 12.9 11.7*  HCT 41.6   < > 44.0 38.0 37.1  MCV 97.7  --   --   --  92.1  MCH 29.6  --   --   --  29.0  MCHC 30.3  --   --   --  31.5  RDW 16.4*  --   --   --  16.0*  PLT 82*  --   --   --  68*   < > = values in this interval not displayed.   Cardiac EnzymesNo results for input(s): TROPONINI in the last 168 hours. No results for input(s): TROPIPOC in the last 168 hours.  BNP Recent Labs  Lab 01/31/2019 2212  BNP 1,699.1*    DDimer No results for input(s): DDIMER in the last 168 hours. TSH:  Lab Results  Component Value Date   TSH 3.148 01/24/2019   Lipids: Lab Results  Component Value Date   CHOL 107 03/28/2017   HDL 61 03/28/2017   LDLCALC 35 03/28/2017   TRIG 53 03/28/2017   CHOLHDL 1.8 03/28/2017   HgbA1c: Lab Results  Component Value Date   HGBA1C 5.5 05/28/2016    Radiology/Studies:  Dg Chest Port 1 View  Result Date: 01/31/2019 CLINICAL DATA:  Shortness of breath, patient on BiPAP EXAM: PORTABLE CHEST 1 VIEW COMPARISON:  Radiograph 07/05/2016 FINDINGS: Postsurgical changes related to prior CABG including intact and aligned sternotomy wires and multiple surgical clips projecting over the mediastinum. Aortic valve replacement is noted. Atrial occlusion clip is present. Borderline cardiomegaly. Small right pleural effusion is present. There is diffuse interstitial opacities throughout the lungs, increased from prior studies. Central peribronchovascular thickening is noted as well with cephalized, indistinct vascularity. No focal consolidative process. No pneumothorax. No acute osseous or soft tissue abnormality. IMPRESSION: Findings favor CHF/volume  overload with mild cardiomegaly, interstitial pulmonary edema with small right pleural effusion. Electronically Signed   By: Lovena Le M.D.   On: 01/19/2019 22:22   Assessment and Plan:   1. Acute respiratory failure with hypoxia secondary to acute on chronic CHF  exacerbation: -Patient presented with progressive, worsening shortness of breath over the last 2 weeks -Last echocardiogram from 08/2016 with LVEF at 45 to 50%, known history of mechanical mitral valve -CXR showed findings which favor CHF/volume overload with mild cardiomegaly, interstitial pulmonary edema with small right pleural effusion -EKG with AF rate 106bpm on ED arrival  -BNP noted to be elevated at 1699 -Given IV Lasix 60 mg in the ED with response -Placed on IV Lasix 40 mg twice daily>>continue  -Continue ARB, Toprol-XL -Creatinine elevated at 1.38>>likely in the setting of A>CHF -Needs daily weight and strict I&O -Likely acute CHF related to AF. Will consider DCCV tomorrow or Friday given consistent AC with Coumadin.   2. Paroxysmal atrial fibrillation: -Presenting EKG with AF with HR 106>>artifact on EKG/telemetry -HR typically controlled on metoprolol, anticoagulated with Coumadin -INR. 2.8 today>>goal 2.5-3.5 with mechanical valve -Likely driving factor in A>C HF. Plan to cardiovert this admission  -CHA2DS2VASc = at least 43 (age, female, CHF, vascular disease)   3.  History of mechanical mitral valve on Coumadin therapy: -Cardiac catheterization performed 03/2016 showed normal LV systolic function, moderate mitral stenosis with mean gradient 10 mmHg, 100% right coronary artery with left-to-right collaterals and moderate pulmonary hypertension.   -Mechanical MVR, CABG with SVG to PDA and maze procedure performed 05/2016.  -Echocardiogram following in 08/2016 showed LVEF of 45 to 50%, mechanical mitral valve, moderate left atrial enlargement, severe right atrial enlargement, mild right ventricular enlargement.  -Coumadin dosing per pharmacy -We will obtain repeat echocardiogram as last study from 08/2016  4.  History of CAD s/p CABG: -Denies anginal symptoms -Hx as above  -Continue ASA, statin, Toprol XL  5.  Leg pain: -Acute onset of left LE pain>>>painful to palpation  -On  Coumadin, low concern for DVT -ABIs normal 09/13/2017 -Neuropathic pain?   For questions or updates, please contact [place] Please consult [email or web address] for contact info under Cardiology/STEMI.   SignedKathyrn Drown NP-C [facility] Pager: [phone] 01/24/2019 10:57 AM  I have seen and examined the patient along with Kathyrn Drown NP-C.  I have reviewed the chart, notes and new data.  I agree with PA/NP's note.  Key new complaints: breathing a little better after diuretic Key examination changes: JVP elevated 7-9 cm, V waves to 15 cm, clear lungs, RRR and crisp prosthetic clicks, holosystolic murmur at LLSB, no apical murmurs, chronic brawny edema changes in both calves. Key new findings / data: Initial ECG probably AFib, but now in SR with very long first degree AV block; echo reviewed at bedside (LVEF 50%, severe septal flattening due to RV pressure and volume overload, normal MV prosthesis gradients - mean 2 mm Hg, no MR, severe TR, sPAP 55 mm Hg at least), INR in therapeutic range  PLAN: Severe biventricular HF, R>L. Improving w diuresis. Normal MV prosthesis. Afib may have played a role in decompensation, but she has spontaneously converted. Disproportionate RV dysfunction may be related to fixed PAH due to longstanding rheumatic mitral disease, but should consider presence of OSA as well. Poor compliance with sodium restriction seems to be a major problem, based on daughter's report and patient's admission. Educated at length re: sodium restricted diet, daily weight monitoring.  [name], MD, [place]  [facility] [phone] 01/24/2019, 4:54 PM

## 2019-01-24 NOTE — ED Notes (Signed)
Pt taken off bipap, pt put on 5L. Satting at 100%.

## 2019-01-25 ENCOUNTER — Inpatient Hospital Stay (HOSPITAL_COMMUNITY): Payer: Medicare Other

## 2019-01-25 DIAGNOSIS — Z951 Presence of aortocoronary bypass graft: Secondary | ICD-10-CM

## 2019-01-25 DIAGNOSIS — M79662 Pain in left lower leg: Secondary | ICD-10-CM

## 2019-01-25 DIAGNOSIS — I440 Atrioventricular block, first degree: Secondary | ICD-10-CM

## 2019-01-25 DIAGNOSIS — M7989 Other specified soft tissue disorders: Secondary | ICD-10-CM

## 2019-01-25 DIAGNOSIS — I739 Peripheral vascular disease, unspecified: Secondary | ICD-10-CM

## 2019-01-25 DIAGNOSIS — I071 Rheumatic tricuspid insufficiency: Secondary | ICD-10-CM

## 2019-01-25 DIAGNOSIS — R52 Pain, unspecified: Secondary | ICD-10-CM

## 2019-01-25 LAB — URINALYSIS, ROUTINE W REFLEX MICROSCOPIC
Bilirubin Urine: NEGATIVE
Glucose, UA: NEGATIVE mg/dL
Hgb urine dipstick: NEGATIVE
Ketones, ur: NEGATIVE mg/dL
Leukocytes,Ua: NEGATIVE
Nitrite: NEGATIVE
Protein, ur: NEGATIVE mg/dL
Specific Gravity, Urine: 1.018 (ref 1.005–1.030)
pH: 5 (ref 5.0–8.0)

## 2019-01-25 LAB — CBC WITH DIFFERENTIAL/PLATELET
Abs Immature Granulocytes: 0.08 10*3/uL — ABNORMAL HIGH (ref 0.00–0.07)
Basophils Absolute: 0 10*3/uL (ref 0.0–0.1)
Basophils Relative: 0 %
Eosinophils Absolute: 0 10*3/uL (ref 0.0–0.5)
Eosinophils Relative: 0 %
HCT: 34 % — ABNORMAL LOW (ref 36.0–46.0)
Hemoglobin: 10.7 g/dL — ABNORMAL LOW (ref 12.0–15.0)
Immature Granulocytes: 1 %
Lymphocytes Relative: 5 %
Lymphs Abs: 0.5 10*3/uL — ABNORMAL LOW (ref 0.7–4.0)
MCH: 28.8 pg (ref 26.0–34.0)
MCHC: 31.5 g/dL (ref 30.0–36.0)
MCV: 91.6 fL (ref 80.0–100.0)
Monocytes Absolute: 0.8 10*3/uL (ref 0.1–1.0)
Monocytes Relative: 9 %
Neutro Abs: 7.6 10*3/uL (ref 1.7–7.7)
Neutrophils Relative %: 85 %
Platelets: 64 10*3/uL — ABNORMAL LOW (ref 150–400)
RBC: 3.71 MIL/uL — ABNORMAL LOW (ref 3.87–5.11)
RDW: 15.9 % — ABNORMAL HIGH (ref 11.5–15.5)
WBC Morphology: INCREASED
WBC: 9 10*3/uL (ref 4.0–10.5)
nRBC: 0 % (ref 0.0–0.2)

## 2019-01-25 LAB — ECHOCARDIOGRAM COMPLETE

## 2019-01-25 LAB — COMPREHENSIVE METABOLIC PANEL
ALT: 15 U/L (ref 0–44)
AST: 38 U/L (ref 15–41)
Albumin: 2.7 g/dL — ABNORMAL LOW (ref 3.5–5.0)
Alkaline Phosphatase: 42 U/L (ref 38–126)
Anion gap: 10 (ref 5–15)
BUN: 33 mg/dL — ABNORMAL HIGH (ref 8–23)
CO2: 19 mmol/L — ABNORMAL LOW (ref 22–32)
Calcium: 8.3 mg/dL — ABNORMAL LOW (ref 8.9–10.3)
Chloride: 108 mmol/L (ref 98–111)
Creatinine, Ser: 1.5 mg/dL — ABNORMAL HIGH (ref 0.44–1.00)
GFR calc Af Amer: 41 mL/min — ABNORMAL LOW (ref >60)
GFR calc non Af Amer: 36 mL/min — ABNORMAL LOW (ref >60)
Glucose, Bld: 113 mg/dL — ABNORMAL HIGH (ref 70–99)
Potassium: 4.3 mmol/L (ref 3.5–5.1)
Sodium: 137 mmol/L (ref 135–145)
Total Bilirubin: 1.9 mg/dL — ABNORMAL HIGH (ref 0.3–1.2)
Total Protein: 7.7 g/dL (ref 6.5–8.1)

## 2019-01-25 LAB — PROTIME-INR
INR: 3.9 — ABNORMAL HIGH (ref 0.8–1.2)
Prothrombin Time: 37.3 seconds — ABNORMAL HIGH (ref 11.4–15.2)

## 2019-01-25 MED ORDER — SODIUM CHLORIDE 0.9 % IV BOLUS
500.0000 mL | Freq: Once | INTRAVENOUS | Status: AC
  Administered 2019-01-25: 02:00:00 500 mL via INTRAVENOUS

## 2019-01-25 MED ORDER — WARFARIN SODIUM 2 MG PO TABS
2.0000 mg | ORAL_TABLET | Freq: Once | ORAL | Status: AC
  Administered 2019-01-25: 2 mg via ORAL
  Filled 2019-01-25: qty 1

## 2019-01-25 NOTE — Progress Notes (Signed)
VASCULAR LAB PRELIMINARY  PRELIMINARY  PRELIMINARY  PRELIMINARY  Bilateral lower extremity venous duplex completed.    Preliminary report:  See CV proc for results.   Jasmene Goswami, RVT 01/25/2019, 6:03 PM

## 2019-01-25 NOTE — Progress Notes (Signed)
PROGRESS NOTE    Marie Malone  EXB:[id number] DOB: 09-Aug-1950 DOA: 02/02/2019 PCP: Debbrah Alar, NP   Brief Narrative:  Marie Malone is a 68 y.o. 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  Patient states over the last 2 weeks patient has been getting progressively short of breath on exertion.  Denies any chest pain fever chills or productive cough.  Patient's primary care physician advised her to increase her Lasix twice daily which she is supposed to start today.  Because of worsening shortness of breath patient came to the ER.  EMS was called and patient initially was placed on BiPAP.  Patient also had complained of some left calf cramping.  ED Course: In the ER patient was hypoxic placed on BiPAP chest x-ray shows features consistent with CHF.  Lab work show a BNP of 1699, sodium 139 potassium 4.2 creatinine 1.1 WBC count of 7.6 hemoglobin 12.6 platelets 82 and EKG was showing A. fib rate at 106 bpm.  Patient was given 60 mg IV Lasix following which patient was able to be weaned off BiPAP and presently is on 5 L oxygen.  Patient admitted for further management of acute CHF.  COVID-19 test was negative.  On exam patient has good peripheral pulses with some edema in the lower extremity.   Subjective: 9/17 A/O x4, negative CP, negative abdominal pain, negative S OB, negative N/V.  C/O acute onset of LLE pain extending from posterior calf to mid thigh.   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   S/P CABG x 1   1. Acute respiratory failure with hypoxia secondary to acute on chronic  systolic heart failure last EF measured was 45 to 50% in April 2018 -patient has a known history of mechanical mitral valve.  I have placed patient on Lasix 40 mg IV every 12 has received 60 mg IV in the ER.  Will get cardiology consult for further recommendation.  Closely follow intake output metabolic panel and daily weights.  Patient is on ARB and Toprol-XL. 2. A. fib rate mildly elevated presently improving at the time of my exam.  On Coumadin per pharmacy and Toprol-XL. 3. History of mechanical mitral valve on Coumadin per pharmacy.  See 1.  May need repeat 2D echo cardiology has been consulted. 4. History of CAD status post CABG denies any chest pain.  On statins aspirin Coumadin Toprol-XL. 5. 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 6. Thrombocytopenia likely from cirrhosis follow CBC. 7. Peripheral vascular disease -has good pulses on exam.  Given that patient presented with hypoxia requiring at least 5 L oxygen at this time with history of mechanical mitral valve and other comorbidities patient will need close follow-up and will need at least more than 2 midnight stay and will need inpatient status.   DVT prophylaxis: Warfarin Code Status: Full Family Communication: 9/17 daughter at bedside discussed plan of care answered all questions Disposition Plan: TBD   Consultants:  Cardiology    Procedures/Significant Events:  9/17 bilateral lower extremity Doppler;Right: There is no evidence of deep vein thrombosis in the lower extremity. However, portions of this examination  were limited- see technologist comments above. Left: There is no evidence of deep vein thrombosis in the lower extremity. However, portions of this examination were limited- see technologist comments above. Significant fluid noted in the posterior calf, etiology unknown   I have personally reviewed and interpreted all radiology studies and my findings are as above.  VENTILATOR SETTINGS:    Cultures    Antimicrobials:    Devices    LINES / TUBES:      Continuous Infusions:   Objective: Vitals:   01/25/19 0555 01/25/19 0615 01/25/19 0625 01/25/19 0640  BP: (!) 105/54 106/79 (!) 112/56 (!) 113/51  Pulse: 76 76 76   Resp: (!) 23 18 (!) 22 (!) 27  Temp:      TempSrc:      SpO2: 100% 100% 100%   Weight:      Height:        Intake/Output Summary (Last 24 hours) at 01/25/2019 0759 Last data filed at 01/25/2019 0300 Gross per 24 hour  Intake 500 ml  Output 250 ml  Net 250 ml   Filed Weights   01/25/19 0412  Weight: 62.7 kg    Examination:  General: A/O x4, no acute respiratory distress Eyes: negative scleral hemorrhage, negative anisocoria, negative icterus ENT: Negative Runny nose, negative gingival bleeding, Neck:  Negative scars, masses, torticollis, lymphadenopathy, JVD Lungs: Clear to auscultation bilaterally without wheezes or crackles Cardiovascular: Regular rate and rhythm without murmur gallop or rub normal S1, loud mechanical S2 Abdomen: negative abdominal pain, nondistended, positive soft, bowel sounds, no rebound, no ascites, no appreciable mass Extremities: Pain to palpation L LE calf extending cephalad to mid thigh.  Positive Homans sign  Skin: Negative rashes, lesions, ulcers Psychiatric:  Negative depression, negative anxiety, negative fatigue, negative mania  Central nervous system:  Cranial nerves II through XII intact, tongue/uvula midline, all extremities muscle strength 5/5, sensation intact throughout, negative dysarthria, negative expressive aphasia, negative receptive aphasia.  .     Data Reviewed: Care during the described time interval was provided by me .  I have reviewed this patient's available data, including medical history, events of note, physical examination, and all test results as part of my evaluation.   CBC: Recent Labs  Lab 01/28/2019 2211 01/18/2019 2234 01/25/2019 2250 01/24/19 0224 01/24/19 0316 01/25/19 0217  WBC 7.6   --   --   --  10.9* 9.0  NEUTROABS 5.7  --   --   --  9.5* 7.6  HGB 12.6 14.3 15.0 12.9 11.7* 10.7*  HCT 41.6 42.0 44.0 38.0 37.1 34.0*  MCV 97.7  --   --   --  92.1 91.6  PLT 82*  --   --   --  68* 64*   Basic Metabolic Panel: Recent Labs  Lab 01/11/2019 2211 01/10/2019 2234 01/14/2019 2250 01/24/19 0224 01/24/19 0316 01/25/19 0217  NA 139 143 144 142 139 137  K 4.2 4.5 4.2 4.2 3.9 4.3  CL 106  --  110  --  109 108  CO2 21*  --   --   --  17* 19*  GLUCOSE 92  --  85  --  106* 113*  BUN 21  --  25*  --  24* 33*  CREATININE 1.17*  --  1.00  --  1.38* 1.50*  CALCIUM 8.8*  --   --   --  8.6* 8.3*   GFR: Estimated Creatinine Clearance: 31.7 mL/min (A) (by C-G formula based on SCr of 1.5 mg/dL (  H)). Liver Function Tests: Recent Labs  Lab 01/09/2019 2211 01/24/19 0316 01/25/19 0217  AST 47* 54* 38  ALT 19 18 15   ALKPHOS 60 53 42  BILITOT 1.5* 1.8* 1.9*  PROT 9.6* 8.6* 7.7  ALBUMIN 3.4* 3.1* 2.7*   No results for input(s): LIPASE, AMYLASE in the last 168 hours. No results for input(s): AMMONIA in the last 168 hours. Coagulation Profile: Recent Labs  Lab 01/30/2019 2211 01/24/19 1116 01/25/19 0217  INR 2.2* 2.8* 3.9*   Cardiac Enzymes: No results for input(s): CKTOTAL, CKMB, CKMBINDEX, TROPONINI in the last 168 hours. BNP (last 3 results) No results for input(s): PROBNP in the last 8760 hours. HbA1C: No results for input(s): HGBA1C in the last 72 hours. CBG: No results for input(s): GLUCAP in the last 168 hours. Lipid Profile: No results for input(s): CHOL, HDL, LDLCALC, TRIG, CHOLHDL, LDLDIRECT in the last 72 hours. Thyroid Function Tests: Recent Labs    01/24/19 0454  TSH 3.148   Anemia Panel: No results for input(s): VITAMINB12, FOLATE, FERRITIN, TIBC, IRON, RETICCTPCT in the last 72 hours. Urine analysis:    Component Value Date/Time   COLORURINE YELLOW 01/25/2019 0153   APPEARANCEUR HAZY (A) 01/25/2019 0153   LABSPEC 1.018 01/25/2019 0153   PHURINE 5.0  01/25/2019 0153   GLUCOSEU NEGATIVE 01/25/2019 0153   GLUCOSEU NEGATIVE 05/15/2018 1525   HGBUR NEGATIVE 01/25/2019 0153   BILIRUBINUR NEGATIVE 01/25/2019 0153   BILIRUBINUR NEG 11/07/2017 1013   KETONESUR NEGATIVE 01/25/2019 0153   PROTEINUR NEGATIVE 01/25/2019 0153   UROBILINOGEN 1.0 05/15/2018 1525   NITRITE NEGATIVE 01/25/2019 0153   LEUKOCYTESUR NEGATIVE 01/25/2019 0153   Sepsis Labs: @LABRCNTIP (procalcitonin:4,lacticidven:4)  ) Recent Results (from the past 240 hour(s))  SARS Coronavirus 2 [facility] order, Performed in [facility] hospital lab) Nasopharyngeal Nasopharyngeal Swab     Status: None   Collection Time: 01/22/2019 10:12 PM   Specimen: Nasopharyngeal Swab  Result Value Ref Range Status   SARS Coronavirus 2 NEGATIVE NEGATIVE Final    Comment: (NOTE) If result is NEGATIVE SARS-CoV-2 target nucleic acids are NOT DETECTED. The SARS-CoV-2 RNA is generally detectable in upper and lower  respiratory specimens during the acute phase of infection. The lowest  concentration of SARS-CoV-2 viral copies this assay can detect is 250  copies / mL. A negative result does not preclude SARS-CoV-2 infection  and should not be used as the sole basis for treatment or other  patient management decisions.  A negative result may occur with  improper specimen collection / handling, submission of specimen other  than nasopharyngeal swab, presence of viral mutation(s) within the  areas targeted by this assay, and inadequate number of viral copies  (<250 copies / mL). A negative result must be combined with clinical  observations, patient history, and epidemiological information. If result is POSITIVE SARS-CoV-2 target nucleic acids are DETECTED. The SARS-CoV-2 RNA is generally detectable in upper and lower  respiratory specimens dur ing the acute phase of infection.  Positive  results are indicative of active infection with SARS-CoV-2.  Clinical  correlation with patient history and  other diagnostic information is  necessary to determine patient infection status.  Positive results do  not rule out bacterial infection or co-infection with other viruses. If result is PRESUMPTIVE POSTIVE SARS-CoV-2 nucleic acids MAY BE PRESENT.   A presumptive positive result was obtained on the submitted specimen  and confirmed on repeat testing.  While 2019 novel coronavirus  (SARS-CoV-2) nucleic acids may be present in the submitted sample  additional confirmatory testing may be necessary for epidemiological  and / or clinical management purposes  to differentiate between  SARS-CoV-2 and other Sarbecovirus currently known to infect humans.  If clinically indicated additional testing with an alternate test  methodology [phone]) is advised. The SARS-CoV-2 RNA is generally  detectable in upper and lower respiratory sp ecimens during the acute  phase of infection. The expected result is Negative. Fact Sheet for Patients:  StrictlyIdeas.no Fact Sheet for Healthcare Providers: BankingDealers.co.za This test is not yet approved or cleared by the [place] FDA and has been authorized for detection and/or diagnosis of SARS-CoV-2 by FDA under an Emergency Use Authorization (EUA).  This EUA will remain in effect (meaning this test can be used) for the duration of the COVID-19 declaration under Section 564(b)(1) of the Act, 21 U.S.C. section 360bbb-3(b)(1), unless the authorization is terminated or revoked sooner. Performed at [facility], [place] [address], [place], [place] [address]          Radiology Studies: Dg Chest Port 1 View  Result Date: 01/25/2019 CLINICAL DATA:  Fevers EXAM: PORTABLE CHEST 1 VIEW COMPARISON:  02/04/2019 FINDINGS: Cardiac shadow remains enlarged. Postsurgical changes are again seen. Improvement in the vascular congestion is noted. Small right pleural effusion is again seen. No focal confluent infiltrate  is noted. IMPRESSION: Improved vascular congestion. Persistent right effusion. Electronically Signed   By: Inez Catalina M.D.   On: 01/25/2019 01:34   Dg Chest Port 1 View  Result Date: 02/03/2019 CLINICAL DATA:  Shortness of breath, patient on BiPAP EXAM: PORTABLE CHEST 1 VIEW COMPARISON:  Radiograph 07/05/2016 FINDINGS: Postsurgical changes related to prior CABG including intact and aligned sternotomy wires and multiple surgical clips projecting over the mediastinum. Aortic valve replacement is noted. Atrial occlusion clip is present. Borderline cardiomegaly. Small right pleural effusion is present. There is diffuse interstitial opacities throughout the lungs, increased from prior studies. Central peribronchovascular thickening is noted as well with cephalized, indistinct vascularity. No focal consolidative process. No pneumothorax. No acute osseous or soft tissue abnormality. IMPRESSION: Findings favor CHF/volume overload with mild cardiomegaly, interstitial pulmonary edema with small right pleural effusion. Electronically Signed   By: Lovena Le M.D.   On: 01/24/2019 22:22        Scheduled Meds: . aspirin EC  81 mg Oral Daily  . atorvastatin  40 mg Oral q1800  . furosemide  40 mg Intravenous Q12H  . losartan  25 mg Oral Daily  . metoprolol succinate  50 mg Oral Daily  . potassium chloride  10 mEq Oral BID  . ursodiol  300 mg Oral BID  . Warfarin - Pharmacist Dosing Inpatient   Does not apply q1800   Continuous Infusions:   LOS: 1 day   The patient is critically ill with multiple organ systems failure and requires high complexity decision making for assessment and support, frequent evaluation and titration of therapies, application of advanced monitoring technologies and extensive interpretation of multiple databases. Critical Care Time devoted to patient care services described in this note  Time spent: 40 minutes     Gurneet Matarese, Geraldo Docker, MD [facility] Pager [phone]   If 7PM-7AM, please contact night-coverage [email or web address] Password [facility] 01/25/2019, 7:59 AM

## 2019-01-25 NOTE — Progress Notes (Signed)
Patient has been A&Ox4 but Temp was 102.8 and B/P dropped into the 40'X systolic, when checked manually 80/48 and recheck on monitor 82/50, bladder scan done since she had no urine in the suction cannister and largest number was 418cc, repositioned her and found that she was lying on her tubing and the bed was soaked with urine, after cleaning her up rescanned at 190 largest number. Spoke with Rapid response nurse and gave information and Jeanmarie Plant NP was with him and orders were placed, will continue to monitor closely. Urine specimens sent, CXR taken and IVF's started as per orders.

## 2019-01-25 NOTE — Progress Notes (Signed)
[place] for Coumadin Indication: atrial fibrillation and Mechanical valve  Vital Signs: Temp: 99.5 F (37.5 C) (09/17 0800) Temp Source: Oral (09/17 0800) BP: 120/62 (09/17 0800) Pulse Rate: 77 (09/17 0800)  Labs: Recent Labs    01/17/2019 2211  01/30/2019 2250 01/24/19 0224 01/24/19 0259 01/24/19 0316 01/24/19 0738 01/24/19 0807 01/24/19 1116 01/25/19 0217  HGB 12.6   < > 15.0 12.9  --  11.7*  --   --   --  10.7*  HCT 41.6   < > 44.0 38.0  --  37.1  --   --   --  34.0*  PLT 82*  --   --   --   --  68*  --   --   --  64*  LABPROT 24.2*  --   --   --   --   --   --   --  28.8* 37.3*  INR 2.2*  --   --   --   --   --   --   --  2.8* 3.9*  CREATININE 1.17*  --  1.00  --   --  1.38*  --   --   --  1.50*  TROPONINIHS 20*  --   --   --  132*  --  111* 113*  --   --    < > = values in this interval not displayed.    Estimated Creatinine Clearance: 31.7 mL/min (A) (by C-G formula based on SCr of 1.5 mg/dL (H)).  Assessment: 68 y.o. female admitted with CHF/SOB, h/o Afib and mechanical MVR, to continue Coumadin.  Of note, pt outpatient INR goal noted to be 2-3. However, she does have a mechanical mitral valve along with afib which would require an INR goal of 2.5-3.5. Per discussion with MD, will proceed with an INR goal of 2.5-3.5 for now. This may require a change to her outpatient warfarin regimen at discharge.   INR today increased to 3.9. Hgb 10.7, plt 64 (monitor). No s/sx of bleeding.   PTA regimen is 4 mg daily.  Goal of Therapy:  INR 2.5-3.5 Monitor platelets by anticoagulation protocol: Yes   Plan:  Warfarin 2 mg PO x 1 tonight Daily INR  Antonietta Jewel, PharmD, BCCCP Clinical Pharmacist  Phone: [phone]  Please check AMION for all [place] phone numbers After 10:00 PM, call [place] [phone] 01/25/2019,10:04 AM

## 2019-01-25 NOTE — Progress Notes (Addendum)
Progress Note  Patient Name: Marie Malone Date of Encounter: 01/25/2019  Primary Cardiologist:  [name], MD  Subjective   Breathing better but still needs O2 - used to snore but says it got better after valve surgery  Inpatient Medications    Scheduled Meds: . aspirin EC  81 mg Oral Daily  . atorvastatin  40 mg Oral q1800  . furosemide  40 mg Intravenous Q12H  . losartan  25 mg Oral Daily  . metoprolol succinate  50 mg Oral Daily  . potassium chloride  10 mEq Oral BID  . ursodiol  300 mg Oral BID  . Warfarin - Pharmacist Dosing Inpatient   Does not apply q1800   Continuous Infusions:  PRN Meds: acetaminophen **OR** acetaminophen, ondansetron **OR** ondansetron (ZOFRAN) IV   Vital Signs    Vitals:   01/25/19 0555 01/25/19 0615 01/25/19 0625 01/25/19 0640  BP: (!) 105/54 106/79 (!) 112/56 (!) 113/51  Pulse: 76 76 76   Resp: (!) 23 18 (!) 22 (!) 27  Temp:      TempSrc:      SpO2: 100% 100% 100%   Weight:      Height:        Intake/Output Summary (Last 24 hours) at 01/25/2019 0816 Last data filed at 01/25/2019 0300 Gross per 24 hour  Intake 500 ml  Output 250 ml  Net 250 ml   Filed Weights   01/25/19 0412  Weight: 62.7 kg   Last Weight  Most recent update: 01/25/2019  4:14 AM   Weight  62.7 kg (138 lb 3.2 oz)           Weight change:    Telemetry    SR, PACs - Personally Reviewed  ECG    09/16, SR, HR 75, long 1st degree AV block - Personally Reviewed  Physical Exam   General: Well developed, well nourished, female appearing in no acute distress. Head: Normocephalic, atraumatic.  Neck: Supple without bruits, JVD mildly elevated. Lungs:  Resp regular and unlabored, few scattered rales. Heart: RRR, S1, S2, no S3, S4, 2/6 murmur; no rub. +crisp valve click Abdomen: Soft, non-tender, non-distended with normoactive bowel sounds. No hepatomegaly. No rebound/guarding. No obvious abdominal masses. Extremities: No clubbing, cyanosis, no  edema. Distal pedal pulses are 2+ bilaterally. Neuro: Alert and oriented X 3. Moves all extremities spontaneously. Psych: Normal affect.  Labs    Hematology Recent Labs  Lab 02/01/2019 2211  01/24/19 0224 01/24/19 0316 01/25/19 0217  WBC 7.6  --   --  10.9* 9.0  RBC 4.26  --   --  4.03 3.71*  HGB 12.6   < > 12.9 11.7* 10.7*  HCT 41.6   < > 38.0 37.1 34.0*  MCV 97.7  --   --  92.1 91.6  MCH 29.6  --   --  29.0 28.8  MCHC 30.3  --   --  31.5 31.5  RDW 16.4*  --   --  16.0* 15.9*  PLT 82*  --   --  68* 64*   < > = values in this interval not displayed.    Chemistry Recent Labs  Lab 01/20/2019 2211  01/25/2019 2250 01/24/19 0224 01/24/19 0316 01/25/19 0217  NA 139   < > 144 142 139 137  K 4.2   < > 4.2 4.2 3.9 4.3  CL 106  --  110  --  109 108  CO2 21*  --   --   --  17* 19*  GLUCOSE 92  --  85  --  106* 113*  BUN 21  --  25*  --  24* 33*  CREATININE 1.17*  --  1.00  --  1.38* 1.50*  CALCIUM 8.8*  --   --   --  8.6* 8.3*  PROT 9.6*  --   --   --  8.6* 7.7  ALBUMIN 3.4*  --   --   --  3.1* 2.7*  AST 47*  --   --   --  54* 38  ALT 19  --   --   --  18 15  ALKPHOS 60  --   --   --  53 42  BILITOT 1.5*  --   --   --  1.8* 1.9*  GFRNONAA 48*  --   --   --  39* 36*  GFRAA 56*  --   --   --  46* 41*  ANIONGAP 12  --   --   --  13 10   < > = values in this interval not displayed.     High Sensitivity Troponin:   Recent Labs  Lab 02/04/2019 2211 01/24/19 0259 01/24/19 0738 01/24/19 0807  TROPONINIHS 20* 132* 111* 113*     Other Troponin Tests: No results for input(s): TROPONINI in the last 168 hours. No results for input(s): TROPIPOC in the last 168 hours.   BNP Recent Labs  Lab 01/13/2019 2212  BNP 1,699.1*    Lab Results  Component Value Date   INR 3.9 (H) 01/25/2019   INR 2.8 (H) 01/24/2019   INR 2.2 (H) 01/15/2019   INR  2.3 12/14/2018     Radiology    Dg Chest Port 1 View  Result Date: 01/25/2019 CLINICAL DATA:  Fevers EXAM: PORTABLE CHEST 1 VIEW  COMPARISON:  01/26/2019 FINDINGS: Cardiac shadow remains enlarged. Postsurgical changes are again seen. Improvement in the vascular congestion is noted. Small right pleural effusion is again seen. No focal confluent infiltrate is noted. IMPRESSION: Improved vascular congestion. Persistent right effusion. Electronically Signed   By: Inez Catalina M.D.   On: 01/25/2019 01:34   Dg Chest Port 1 View  Result Date: 02/02/2019 CLINICAL DATA:  Shortness of breath, patient on BiPAP EXAM: PORTABLE CHEST 1 VIEW COMPARISON:  Radiograph 07/05/2016 FINDINGS: Postsurgical changes related to prior CABG including intact and aligned sternotomy wires and multiple surgical clips projecting over the mediastinum. Aortic valve replacement is noted. Atrial occlusion clip is present. Borderline cardiomegaly. Small right pleural effusion is present. There is diffuse interstitial opacities throughout the lungs, increased from prior studies. Central peribronchovascular thickening is noted as well with cephalized, indistinct vascularity. No focal consolidative process. No pneumothorax. No acute osseous or soft tissue abnormality. IMPRESSION: Findings favor CHF/volume overload with mild cardiomegaly, interstitial pulmonary edema with small right pleural effusion. Electronically Signed   By: Lovena Le M.D.   On: 01/30/2019 22:22     Cardiac Studies   ECHO:    Patient Profile     68 y.o. 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   Assessment & Plan    1. Acute on chronic CHF:  - on Lasix 40 mg IV BID, may be close to changing to PO Lasix -  some issues w/ recording UOP, incomplete - no wt 09/16 - continue ARB, BB - emphasize Na, fluid compliance as outpt  2. PAF - seen on  presentation to ER - INR is therapeutic X 1 month - spontaneous conversion to SR  3. S/p mech MV - echo results not in system, EF 50% and MV ok  4. RV dysfunction - may be related to [place] 2nd MV dz (long-standing) - but may have OSA as well, though pt says snoring improved after valve surgery  Otherwise, per IM Principal Problem:   Acute respiratory failure with hypoxia ([place]) Active Problems:   Essential hypertension   Pulmonary hypertension (HCC)   Paroxysmal atrial fibrillation (HCC)   S/P mitral valve replacement with mechanical valve + CABG x1 + Maze procedure   S/P CABG x 1    Signed, Rosaria Ferries , PA-C 8:16 AM 01/25/2019 Pager: [phone]  I have seen and examined the patient along with Rosaria Ferries , PA-C, .  I have reviewed the chart, notes and new data.  I agree with PA/NP's note.  Key new complaints: Breathing much better.  Able to lie fully flat in bed without dyspnea.  Still has swelling, particularly in the left leg which still feels a little tender. Key examination changes: Remains in regular rhythm (sinus rhythm with a very long first-degree AV block on telemetry), crisp prosthetic valve clicks without apical murmurs or rumbles, holosystolic murmur at left lower sternal border does not sound as loud as yesterday Key new findings / data: Echocardiogram shows unchanged to mildly depressed left ventricular systolic function with EF of 45-50%, unchanged normal gradients across the mitral valve prosthesis.  There is substantially worsened tricuspid regurgitation (on my review of the echo images the tricuspid regurgitation is moderate to severe).  And moderate pulmonary hypertension with disproportionate enlargement of right heart chambers  PLAN: - She is maintaining normal rhythm, will not require cardioversion.  This also makes it less likely that the atrial fibrillation was the cause of her decompensation. - Continue intravenous diuretics, very likely we will be  able to switch to oral diuretics tomorrow.  After that would like to monitor for 24 hours to make sure she is maintaining net neutral or slightly negative fluid balance. She reports that her "dry weight" on her home scale is around 130s, but cannot be more precise than that.  Her daughter will get the weight log from her home.  Today she weighs 138 pounds. - Suspect that the tricuspid regurgitation is largely a functional abnormality and that it will improve with reduction in volume.  However, she probably has some degree of fixed permanent pulmonary arteriolar hypertension due to longstanding mitral valve disease, possibly also contribution of sleep apnea.  Cannot exclude some contribution of rheumatic disease to her tricuspid insufficiency as well.  She will therefore always be at risk of developing right heart failure. - Reinforced the importance of sodium restriction.  She has been faithfully monitoring her weight, but did not report to her family or to her physician when she gained 10-15 pounds prior to this admission.  She should call for advice regarding diuretics if she gains more than 3 pounds in 24 hours or if she ever gains more than 5 pounds from her "dry weight"/discharge weight.   [name], MD, [place] [phone] 01/25/2019, 12:36 PM

## 2019-01-26 DIAGNOSIS — E861 Hypovolemia: Secondary | ICD-10-CM

## 2019-01-26 DIAGNOSIS — R197 Diarrhea, unspecified: Secondary | ICD-10-CM

## 2019-01-26 DIAGNOSIS — I9589 Other hypotension: Secondary | ICD-10-CM

## 2019-01-26 LAB — CBC
HCT: 38.8 % (ref 36.0–46.0)
Hemoglobin: 12 g/dL (ref 12.0–15.0)
MCH: 28.8 pg (ref 26.0–34.0)
MCHC: 30.9 g/dL (ref 30.0–36.0)
MCV: 93 fL (ref 80.0–100.0)
Platelets: 59 10*3/uL — ABNORMAL LOW (ref 150–400)
RBC: 4.17 MIL/uL (ref 3.87–5.11)
RDW: 16.3 % — ABNORMAL HIGH (ref 11.5–15.5)
WBC: 7 10*3/uL (ref 4.0–10.5)
nRBC: 0 % (ref 0.0–0.2)

## 2019-01-26 LAB — COMPREHENSIVE METABOLIC PANEL
ALT: 17 U/L (ref 0–44)
AST: 35 U/L (ref 15–41)
Albumin: 2.7 g/dL — ABNORMAL LOW (ref 3.5–5.0)
Alkaline Phosphatase: 52 U/L (ref 38–126)
Anion gap: 12 (ref 5–15)
BUN: 45 mg/dL — ABNORMAL HIGH (ref 8–23)
CO2: 19 mmol/L — ABNORMAL LOW (ref 22–32)
Calcium: 8.8 mg/dL — ABNORMAL LOW (ref 8.9–10.3)
Chloride: 107 mmol/L (ref 98–111)
Creatinine, Ser: 1.7 mg/dL — ABNORMAL HIGH (ref 0.44–1.00)
GFR calc Af Amer: 36 mL/min — ABNORMAL LOW (ref >60)
GFR calc non Af Amer: 31 mL/min — ABNORMAL LOW (ref >60)
Glucose, Bld: 78 mg/dL (ref 70–99)
Potassium: 4.5 mmol/L (ref 3.5–5.1)
Sodium: 138 mmol/L (ref 135–145)
Total Bilirubin: 2.5 mg/dL — ABNORMAL HIGH (ref 0.3–1.2)
Total Protein: 8.7 g/dL — ABNORMAL HIGH (ref 6.5–8.1)

## 2019-01-26 LAB — C DIFFICILE QUICK SCREEN W PCR REFLEX
C Diff antigen: NEGATIVE
C Diff interpretation: NOT DETECTED
C Diff toxin: NEGATIVE

## 2019-01-26 LAB — URINE CULTURE

## 2019-01-26 LAB — PROTIME-INR
INR: 3.4 — ABNORMAL HIGH (ref 0.8–1.2)
Prothrombin Time: 33.8 seconds — ABNORMAL HIGH (ref 11.4–15.2)

## 2019-01-26 LAB — MAGNESIUM: Magnesium: 2 mg/dL (ref 1.7–2.4)

## 2019-01-26 MED ORDER — WARFARIN SODIUM 2 MG PO TABS
2.0000 mg | ORAL_TABLET | Freq: Once | ORAL | Status: DC
  Filled 2019-01-26: qty 1

## 2019-01-26 MED ORDER — SODIUM CHLORIDE 0.9 % IV SOLN
INTRAVENOUS | Status: DC
  Administered 2019-01-26: 09:00:00 100 mL via INTRAVENOUS
  Administered 2019-01-28 – 2019-01-29 (×2): via INTRAVENOUS

## 2019-01-26 MED ORDER — METOPROLOL SUCCINATE ER 25 MG PO TB24
25.0000 mg | ORAL_TABLET | Freq: Every day | ORAL | Status: DC
  Administered 2019-01-27 – 2019-01-28 (×2): 25 mg via ORAL
  Filled 2019-01-26 (×3): qty 1

## 2019-01-26 NOTE — Care Management Important Message (Signed)
Important Message  Patient Details  Name: Marie Malone MRN: [id number] Date of Birth: 1950/08/30   Medicare Important Message Given:  Yes     Shelda Altes 01/26/2019, 1:25 PM

## 2019-01-26 NOTE — Progress Notes (Addendum)
Progress Note  Patient Name: Marie Malone Date of Encounter: 01/26/2019  Primary Cardiologist:  [name], MD  Subjective   C/o abd pain, denies SOB or CP. Just does not feel well. Has been having diarrhea.  Inpatient Medications    Scheduled Meds: . aspirin EC  81 mg Oral Daily  . atorvastatin  40 mg Oral q1800  . losartan  25 mg Oral Daily  . metoprolol succinate  50 mg Oral Daily  . potassium chloride  10 mEq Oral BID  . ursodiol  300 mg Oral BID  . Warfarin - Pharmacist Dosing Inpatient   Does not apply q1800   Continuous Infusions:  PRN Meds: acetaminophen **OR** acetaminophen, ondansetron **OR** ondansetron (ZOFRAN) IV    Vital Signs    Vitals:   01/26/19 0500 01/26/19 0505 01/26/19 0545 01/26/19 0817  BP:   (!) 93/50 (!) 75/48  Pulse:   81 80  Resp:  (!) 22 (!) 34   Temp: 98.7 F (37.1 C)     TempSrc: Oral     SpO2:   97% 98%  Weight:      Height:        Intake/Output Summary (Last 24 hours) at 01/26/2019 0824 Last data filed at 01/26/2019 0355 Gross per 24 hour  Intake 300 ml  Output 201 ml  Net 99 ml   Filed Weights   01/25/19 0412  Weight: 62.7 kg   Last Weight  Most recent update: 01/25/2019  4:14 AM   Weight  62.7 kg (138 lb 3.2 oz)           Weight change:    Telemetry    SR, PVCs, pairs and a 3 bt run NSVT - Personally Reviewed  ECG    09/16, SR, HR 75, long 1st degree AV block - Personally Reviewed  Physical Exam   General: Well developed, frail elderly, female in moderate distress Head: Eyes PERRLA Normocephalic and atraumatic Lungs: decreased BS bases. Heart: HRRR S1 S2, without rub or gallop. 2/6 murmur.  Pulses are 1+ & equal. L>R JVD. Abdomen: Bowel sounds are present but decreased, abdomen soft and diffusely tender without masses or  hernias noted. Msk: Normal strength and tone for age. Extremities: No clubbing, cyanosis; 1+ LLE edema Skin:  No rashes or lesions noted. Neuro: Alert and oriented X 2.  Labs     Hematology Recent Labs  Lab 01/24/19 0316 01/25/19 0217 01/26/19 0354  WBC 10.9* 9.0 7.0  RBC 4.03 3.71* 4.17  HGB 11.7* 10.7* 12.0  HCT 37.1 34.0* 38.8  MCV 92.1 91.6 93.0  MCH 29.0 28.8 28.8  MCHC 31.5 31.5 30.9  RDW 16.0* 15.9* 16.3*  PLT 68* 64* 59*    Chemistry Recent Labs  Lab 01/24/19 0316 01/25/19 0217 01/26/19 0354  NA 139 137 138  K 3.9 4.3 4.5  CL 109 108 107  CO2 17* 19* 19*  GLUCOSE 106* 113* 78  BUN 24* 33* 45*  CREATININE 1.38* 1.50* 1.70*  CALCIUM 8.6* 8.3* 8.8*  PROT 8.6* 7.7 8.7*  ALBUMIN 3.1* 2.7* 2.7*  AST 54* 38 35  ALT 18 15 17   ALKPHOS 53 42 52  BILITOT 1.8* 1.9* 2.5*  GFRNONAA 39* 36* 31*  GFRAA 46* 41* 36*  ANIONGAP 13 10 12     Magnesium  Date Value Ref Range Status  01/26/2019 2.0 1.7 - 2.4 mg/dL Final    Comment:    Performed at [facility], [place] [address] [place], [place]  [address]   High Sensitivity Troponin:   Recent Labs  Lab 01/31/2019 2211 01/24/19 0259 01/24/19 0738 01/24/19 0807  TROPONINIHS 20* 132* 111* 113*     BNP Recent Labs  Lab 01/18/2019 2212  BNP 1,699.1*     Lab Results  Component Value Date   INR 3.4 (H) 01/26/2019   INR 3.9 (H) 01/25/2019   INR 2.8 (H) 01/24/2019   PROTIME 21.1 (A) 06/04/2016   PROTIME 24.6 (A) 06/03/2016   PROTIME 28.8 (A) 06/02/2016    Radiology    Dg Chest Port 1 View  Result Date: 01/25/2019 CLINICAL DATA:  Fevers EXAM: PORTABLE CHEST 1 VIEW COMPARISON:  01/22/2019 FINDINGS: Cardiac shadow remains enlarged. Postsurgical changes are again seen. Improvement in the vascular congestion is noted. Small right pleural effusion is again seen. No focal confluent infiltrate is noted. IMPRESSION: Improved vascular congestion. Persistent right effusion. Electronically Signed   By: Inez Catalina M.D.   On: 01/25/2019 01:34   Dg Chest Port 1 View  Result Date: 02/05/2019 CLINICAL DATA:  Shortness of breath, patient on BiPAP EXAM: PORTABLE CHEST 1 VIEW COMPARISON:  Radiograph  07/05/2016 FINDINGS: Postsurgical changes related to prior CABG including intact and aligned sternotomy wires and multiple surgical clips projecting over the mediastinum. Aortic valve replacement is noted. Atrial occlusion clip is present. Borderline cardiomegaly. Small right pleural effusion is present. There is diffuse interstitial opacities throughout the lungs, increased from prior studies. Central peribronchovascular thickening is noted as well with cephalized, indistinct vascularity. No focal consolidative process. No pneumothorax. No acute osseous or soft tissue abnormality. IMPRESSION: Findings favor CHF/volume overload with mild cardiomegaly, interstitial pulmonary edema with small right pleural effusion. Electronically Signed   By: Lovena Le M.D.   On: 01/25/2019 22:22   Vas [place] Lower Extremity Venous (dvt)  Result Date: 01/25/2019  Lower Venous Study Indications: Pain, and Swelling, interstitial edema.  Limitations: Poor ultrasound/tissue interface and patient's pain with touch. Comparison Study: No prior study on file for comparison Performing Technologist: Sharion Dove RVS  Examination Guidelines: A complete evaluation includes B-mode imaging, spectral Doppler, color Doppler, and power Doppler as needed of all accessible portions of each vessel. Bilateral testing is considered an integral part of a complete examination. Limited examinations for reoccurring indications may be performed as noted.  +---------+---------------+---------+-----------+----------+--------------+ RIGHT    CompressibilityPhasicitySpontaneityPropertiesThrombus Aging +---------+---------------+---------+-----------+----------+--------------+ CFV      Full                                         Pulsatile flow +---------+---------------+---------+-----------+----------+--------------+ SFJ      Full                                                         +---------+---------------+---------+-----------+----------+--------------+ FV Prox  Full                                                        +---------+---------------+---------+-----------+----------+--------------+ FV Mid   Full                                                        +---------+---------------+---------+-----------+----------+--------------+  FV DistalFull                                                        +---------+---------------+---------+-----------+----------+--------------+ PFV      Full                                                        +---------+---------------+---------+-----------+----------+--------------+ POP      Full                                         Pulsatile flow +---------+---------------+---------+-----------+----------+--------------+ PTV      Full                                                        +---------+---------------+---------+-----------+----------+--------------+ PERO     Full                                                        +---------+---------------+---------+-----------+----------+--------------+   +---------+---------------+---------+-----------+----------+--------------+ LEFT     CompressibilityPhasicitySpontaneityPropertiesThrombus Aging +---------+---------------+---------+-----------+----------+--------------+ CFV      Full                                         Pulsatile flow +---------+---------------+---------+-----------+----------+--------------+ SFJ      Full                                                        +---------+---------------+---------+-----------+----------+--------------+ FV Prox  Full                                                        +---------+---------------+---------+-----------+----------+--------------+ FV Mid   Full                                                         +---------+---------------+---------+-----------+----------+--------------+ FV DistalFull                                                        +---------+---------------+---------+-----------+----------+--------------+  PFV      Full                                                        +---------+---------------+---------+-----------+----------+--------------+ POP      Full                                         Pulsatile flow +---------+---------------+---------+-----------+----------+--------------+ PTV      Full                                                        +---------+---------------+---------+-----------+----------+--------------+ PERO     Full                                                        +---------+---------------+---------+-----------+----------+--------------+     Summary: Right: There is no evidence of deep vein thrombosis in the lower extremity. However, portions of this examination were limited- see technologist comments above. Left: There is no evidence of deep vein thrombosis in the lower extremity. However, portions of this examination were limited- see technologist comments above. Significant fluid noted in the posterior calf, etiology unknown.  *See table(s) above for measurements and observations.    Preliminary      Cardiac Studies   ECHO:  01/24/2019  1. Left ventricular ejection fraction, by visual estimation, is 45 to 50%. The left ventricle has mildly decreased function. Normal left ventricular size. Left ventricular septal wall thickness was normal. Normal left ventricular posterior wall thickness. There is no left ventricular hypertrophy.  2. Indeterminate diastolic function due to mechanical mitral valve pattern of LV diastolic filling.  3. Right ventricular pressure overload and right ventricular volume overload.  4. Global right ventricle has moderately reduced systolic function.The right ventricular size is moderately  enlarged. No increase in right ventricular wall thickness.  5. Left atrial size was moderately dilated.  6. Right atrial size was severely dilated.  7. The mitral valve has been repaired/replaced. No evidence of mitral valve regurgitation. No evidence of mitral stenosis.  8. The tricuspid valve is normal in structure. Tricuspid valve regurgitation mild-moderate.  9. The aortic valve The aortic valve is normal in structure. Aortic valve regurgitation was not visualized by color flow Doppler. Structurally normal aortic valve, with no evidence of sclerosis or stenosis. 10. The pulmonic valve was normal in structure. Pulmonic valve regurgitation is mild by color flow Doppler. 11. Moderately elevated pulmonary artery systolic pressure. 12. The inferior vena cava is dilated in size with <50% respiratory variability, suggesting right atrial pressure of 15 mmHg.  Patient Profile     68 y.o. 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  Assessment & Plan    1. Acute on chronic CHF:  - unclear if she is over-diuresed, or if the diarrhea is responsible for the hypotension - d/c Lasix - need to follow weights - hold ARB, Kdur, put parameters on BB and decrease dose to 25 mg qd  2. PAF - spontaneous conversion to SR - increased ventricular ectopy today, K+ 4.5, Mg 2.0 - INR is therapeutic  3. S/p mech MV - see results above  4. RV dysfunction - may be related to [place], which is felt 2nd longstanding MV dz - watch for OSA, desats while asleep  5. Hypotension - in the setting of diarrhea and IV Lasix - will give 100 cc/hr x 3 hr since SBP 70s right now - hold BP lowering meds, Lasix has been d/c'd - eval per IM, but with diarrhea, consider stool,  abdominal eval - blood and urine cx pending  Otherwise, per IM Principal Problem:   Acute respiratory failure with hypoxia ([facility]) Active Problems:   Essential hypertension   Pulmonary hypertension (HCC)   Paroxysmal atrial fibrillation (HCC)   S/P mitral valve replacement with mechanical valve + CABG x1 + Maze procedure   S/P CABG x 1    Signed, Rosaria Ferries , PA-C 8:24 AM 01/26/2019 Pager: [phone]  I have seen and examined the patient along with Rosaria Ferries , PA-C.  I have reviewed the chart, notes and new data.  I agree with PA/NP's note.  Key new complaints: diarrhea, weakness, no dyspnea Key examination changes: hypotensive, too weak to stand for weight Key new findings / data: creat increased to 1.7  PLAN: Stop furosemide and hold BP meds until BP returns to normal range.  [name], MD, [place] [phone] 01/26/2019, 9:32 AM

## 2019-01-26 NOTE — Progress Notes (Signed)
PROGRESS NOTE    Marie Malone  JME:[id number] DOB: 1951/04/12 DOA: 01/10/2019 PCP: Debbrah Alar, NP   Brief Narrative:  Marie Malone is a 68 y.o. 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  Patient states over the last 2 weeks patient has been getting progressively short of breath on exertion.  Denies any chest pain fever chills or productive cough.  Patient's primary care physician advised her to increase her Lasix twice daily which she is supposed to start today.  Because of worsening shortness of breath patient came to the ER.  EMS was called and patient initially was placed on BiPAP.  Patient also had complained of some left calf cramping.  ED Course: In the ER patient was hypoxic placed on BiPAP chest x-ray shows features consistent with CHF.  Lab work show a BNP of 1699, sodium 139 potassium 4.2 creatinine 1.1 WBC count of 7.6 hemoglobin 12.6 platelets 82 and EKG was showing A. fib rate at 106 bpm.  Patient was given 60 mg IV Lasix following which patient was able to be weaned off BiPAP and presently is on 5 L oxygen.  Patient admitted for further management of acute CHF.  COVID-19 test was negative.  On exam patient has good peripheral pulses with some edema in the lower extremity.   Subjective: 9/18 A/O x4, negative CP, negative abdominal pain, negative S OB, negative N/V.  Continues to complain of LLE pain.  4 x watery/loose stools    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   S/P CABG x 1  Acute respiratory failure with hypoxia -Not on home O2 -Titrate O2 to maintain SPO2  89 to 93%  Acute on chronic systolic CHF -Strict in and out +418ml -Daily weight Filed Weights   01/25/19 0412  Weight: 62.7 kg  - EF 40 to 45% see echocardiogram results below - Per cardiology discontinue Lasix, ARB, and K-Dur -Decrease metoprolol 25 mg daily  Mechanical mitral valve - Continue warfarin, monitor INR Recent Labs  Lab 01/16/2019 2211 01/24/19 1116 01/25/19 0217 01/26/19 0354  INR 2.2* 2.8* 3.9* 3.4*  -Therapeutic INR goal 2.5-3.5  Atrial fibrillation - Currently NSR - Anticoagulated for her mechanical mitral valve  L LE cellulitis vs SIRS -Patient with extremely discolored, warm to touch, painful to touch, L LE - Obtain blood culture, urine culture - Patient also with loose stools (possibly from Ursodiol) however patient has been chronically on this medication. -Obtain C. difficile PCR - Once all stool and cultures drawn.  Start empiric antibiotic initially for cellulitis.  -Most likely secondary to overdiuresis and diarrhea. - See CHF and PBS  Acute renal failure (baseline Cr 1.0) Recent Labs  Lab 01/27/2019 2211 01/10/2019 2250 01/24/19 0316 01/25/19 0217 01/26/19 0354  CREATININE 1.17* 1.00 1.38* 1.50* 1.70*  -Most likely secondary to overdiuresis and diarrhea. - Normal saline 139ml/hr  Primary biliary cirrhosis -Ursodial 300 mg BID (hold)    DVT prophylaxis: Warfarin Code Status: Full Family Communication: 9/18 daughter at bedside discussed plan of care answered all questions Disposition Plan: TBD   Consultants:  Cardiology    Procedures/Significant Events:  9/16 echocardiogram:Left  ventricular ejection fraction, EF = 45 to 50%.  - Indeterminate diastolic function due to mechanical mitral valve pattern of LV diastolic filling. -Right ventricular pressure overload and right ventricular volume overload. -Global right ventricle has moderately reduced systolic function.The right ventricular size is moderately enlarged.  -Left atrial   moderately dilated. -Right atrial severely dilated. -The mitral valve has been repaired/replaced. No evidence of mitral valve regurgitation. No evidence of mitral stenosis. -The tricuspid valve is normal in structure. Tricuspid valve regurgitation mild-moderate. - Moderately elevated pulmonary artery systolic pressure. -The inferior vena cava is dilated in size with <50% respiratory variability, suggesting right atrial pressure of 15 mmHg. 9/17 bilateral lower extremity Doppler;Right: There is no evidence of deep vein thrombosis in the lower extremity. However, portions of this examination were limited- see technologist comments above. Left: There is no evidence of deep vein thrombosis in the lower extremity. However, portions of this examination were limited- see technologist comments above. Significant fluid noted in the posterior calf, etiology unknown   I have personally reviewed and interpreted all radiology studies and my findings are as above.  VENTILATOR SETTINGS:    Cultures 9/18 blood pending 9/18 urine pending 9/18 C. difficile pending  Antimicrobials: Anti-infectives (From admission, onward)   None       Devices    LINES / TUBES:      Continuous Infusions:   Objective: Vitals:   01/26/19 0455 01/26/19 0500 01/26/19 0505 01/26/19 0545  BP:    (!) 93/50  Pulse:    81  Resp: (!) 22  (!) 22 (!) 34  Temp:  98.7 F (37.1 C)    TempSrc:  Oral    SpO2:    97%  Weight:      Height:        Intake/Output Summary (Last 24 hours) at 01/26/2019 0758 Last data filed at 01/26/2019 0355 Gross per 24 hour  Intake 300 ml  Output 201 ml  Net 99 ml   Filed Weights   01/25/19 0412  Weight: 62.7 kg   Physical Exam:  General: A/O x4, positive acute respiratory distress Eyes: negative scleral hemorrhage, negative anisocoria, negative icterus ENT: Negative Runny nose, negative gingival bleeding, Neck:  Negative scars, masses, torticollis, lymphadenopathy, JVD  Lungs: Clear to auscultation bilaterally without wheezes or crackles Cardiovascular: Regular rate and rhythm without murmur gallop or rub normal S1 loud mechanical  S2 Abdomen: negative abdominal pain, nondistended, positive soft, bowel sounds, no rebound, no ascites, no appreciable mass Extremities: Pain to palpation L LE calf extending cephalad to mid thigh.  Hot to touch, pur de orange feel to the skin  Skin: Negative rashes, lesions, ulcers Psychiatric:  Negative depression, negative anxiety, negative fatigue, negative mania  Central nervous system:  Cranial nerves II through XII intact, tongue/uvula midline, all extremities muscle strength 5/5, sensation intact throughout, negative dysarthria, negative expressive aphasia, negative receptive aphasia.      Data Reviewed: Care during the described time interval was provided by me .  I have reviewed this patient's available data, including medical history, events of note, physical examination, and all test results as part of my evaluation.   CBC: Recent Labs  Lab 01/10/2019 2211  02/07/2019 2250 01/24/19 0224 01/24/19 0316 01/25/19 0217 01/26/19 0354  WBC 7.6  --   --   --  10.9* 9.0 7.0  NEUTROABS 5.7  --   --   --  9.5* 7.6  --   HGB 12.6   < > 15.0 12.9 11.7* 10.7*  12.0  HCT 41.6   < > 44.0 38.0 37.1 34.0* 38.8  MCV 97.7  --   --   --  92.1 91.6 93.0  PLT 82*  --   --   --  68* 64* 59*   < > = values in this interval not displayed.   Basic Metabolic Panel: Recent Labs  Lab 02/05/2019 2211  01/20/2019 2250 01/24/19 0224 01/24/19 0316 01/25/19 0217 01/26/19 0354  NA 139   < > 144 142 139 137 138  K 4.2   < > 4.2 4.2 3.9 4.3 4.5  CL 106  --  110  --  109 108 107  CO2 21*  --   --   --  17* 19* 19*  GLUCOSE 92  --  85  --  106* 113* 78  BUN 21  --  25*  --  24* 33* 45*  CREATININE 1.17*  --  1.00  --  1.38* 1.50* 1.70*  CALCIUM 8.8*  --   --   --  8.6* 8.3* 8.8*  MG  --   --   --   --   --   --  2.0   < > = values in this  interval not displayed.   GFR: Estimated Creatinine Clearance: 27.9 mL/min (A) (by C-G formula based on SCr of 1.7 mg/dL (H)). Liver Function Tests: Recent Labs  Lab 01/24/2019 2211 01/24/19 0316 01/25/19 0217 01/26/19 0354  AST 47* 54* 38 35  ALT 19 18 15 17   ALKPHOS 60 53 42 52  BILITOT 1.5* 1.8* 1.9* 2.5*  PROT 9.6* 8.6* 7.7 8.7*  ALBUMIN 3.4* 3.1* 2.7* 2.7*   No results for input(s): LIPASE, AMYLASE in the last 168 hours. No results for input(s): AMMONIA in the last 168 hours. Coagulation Profile: Recent Labs  Lab 02/05/2019 2211 01/24/19 1116 01/25/19 0217 01/26/19 0354  INR 2.2* 2.8* 3.9* 3.4*   Cardiac Enzymes: No results for input(s): CKTOTAL, CKMB, CKMBINDEX, TROPONINI in the last 168 hours. BNP (last 3 results) No results for input(s): PROBNP in the last 8760 hours. HbA1C: No results for input(s): HGBA1C in the last 72 hours. CBG: No results for input(s): GLUCAP in the last 168 hours. Lipid Profile: No results for input(s): CHOL, HDL, LDLCALC, TRIG, CHOLHDL, LDLDIRECT in the last 72 hours. Thyroid Function Tests: Recent Labs    01/24/19 0454  TSH 3.148   Anemia Panel: No results for input(s): VITAMINB12, FOLATE, FERRITIN, TIBC, IRON, RETICCTPCT in the last 72 hours. Urine analysis:    Component Value Date/Time   COLORURINE YELLOW 01/25/2019 0153   APPEARANCEUR HAZY (A) 01/25/2019 0153   LABSPEC 1.018 01/25/2019 0153   PHURINE 5.0 01/25/2019 0153   GLUCOSEU NEGATIVE 01/25/2019 0153   GLUCOSEU NEGATIVE 05/15/2018 1525   HGBUR NEGATIVE 01/25/2019 0153   BILIRUBINUR NEGATIVE 01/25/2019 0153   BILIRUBINUR NEG 11/07/2017 1013   KETONESUR NEGATIVE 01/25/2019 0153   PROTEINUR NEGATIVE 01/25/2019 0153   UROBILINOGEN 1.0 05/15/2018 1525   NITRITE NEGATIVE 01/25/2019 0153   LEUKOCYTESUR NEGATIVE 01/25/2019 0153   Sepsis Labs: @LABRCNTIP (procalcitonin:4,lacticidven:4)  ) Recent Results (from the past 240 hour(s))  SARS Coronavirus 2 [facility] order,  Performed in [facility] hospital lab) Nasopharyngeal Nasopharyngeal Swab     Status: None   Collection Time: 02/01/2019 10:12 PM   Specimen: Nasopharyngeal Swab  Result Value Ref Range Status   SARS Coronavirus 2 NEGATIVE NEGATIVE Final    Comment: (NOTE) If result is NEGATIVE SARS-CoV-2 target nucleic acids are NOT  DETECTED. The SARS-CoV-2 RNA is generally detectable in upper and lower  respiratory specimens during the acute phase of infection. The lowest  concentration of SARS-CoV-2 viral copies this assay can detect is 250  copies / mL. A negative result does not preclude SARS-CoV-2 infection  and should not be used as the sole basis for treatment or other  patient management decisions.  A negative result may occur with  improper specimen collection / handling, submission of specimen other  than nasopharyngeal swab, presence of viral mutation(s) within the  areas targeted by this assay, and inadequate number of viral copies  (<250 copies / mL). A negative result must be combined with clinical  observations, patient history, and epidemiological information. If result is POSITIVE SARS-CoV-2 target nucleic acids are DETECTED. The SARS-CoV-2 RNA is generally detectable in upper and lower  respiratory specimens dur ing the acute phase of infection.  Positive  results are indicative of active infection with SARS-CoV-2.  Clinical  correlation with patient history and other diagnostic information is  necessary to determine patient infection status.  Positive results do  not rule out bacterial infection or co-infection with other viruses. If result is PRESUMPTIVE POSTIVE SARS-CoV-2 nucleic acids MAY BE PRESENT.   A presumptive positive result was obtained on the submitted specimen  and confirmed on repeat testing.  While 2019 novel coronavirus  (SARS-CoV-2) nucleic acids may be present in the submitted sample  additional confirmatory testing may be necessary for epidemiological  and / or  clinical management purposes  to differentiate between  SARS-CoV-2 and other Sarbecovirus currently known to infect humans.  If clinically indicated additional testing with an alternate test  methodology [phone]) is advised. The SARS-CoV-2 RNA is generally  detectable in upper and lower respiratory sp ecimens during the acute  phase of infection. The expected result is Negative. Fact Sheet for Patients:  StrictlyIdeas.no Fact Sheet for Healthcare Providers: BankingDealers.co.za This test is not yet approved or cleared by the [place] FDA and has been authorized for detection and/or diagnosis of SARS-CoV-2 by FDA under an Emergency Use Authorization (EUA).  This EUA will remain in effect (meaning this test can be used) for the duration of the COVID-19 declaration under Section 564(b)(1) of the Act, 21 U.S.C. section 360bbb-3(b)(1), unless the authorization is terminated or revoked sooner. Performed at [facility], [place] [address] [place], [place] [address]          Radiology Studies: Dg Chest Port 1 View  Result Date: 01/25/2019 CLINICAL DATA:  Fevers EXAM: PORTABLE CHEST 1 VIEW COMPARISON:  01/16/2019 FINDINGS: Cardiac shadow remains enlarged. Postsurgical changes are again seen. Improvement in the vascular congestion is noted. Small right pleural effusion is again seen. No focal confluent infiltrate is noted. IMPRESSION: Improved vascular congestion. Persistent right effusion. Electronically Signed   By: Inez Catalina M.D.   On: 01/25/2019 01:34   Vas [place] Lower Extremity Venous (dvt)  Result Date: 01/25/2019  Lower Venous Study Indications: Pain, and Swelling, interstitial edema.  Limitations: Poor ultrasound/tissue interface and patient's pain with touch. Comparison Study: No prior study on file for comparison Performing Technologist: Sharion Dove RVS  Examination Guidelines: A complete evaluation includes B-mode imaging,  spectral Doppler, color Doppler, and power Doppler as needed of all accessible portions of each vessel. Bilateral testing is considered an integral part of a complete examination. Limited examinations for reoccurring indications may be performed as noted.  +---------+---------------+---------+-----------+----------+--------------+ RIGHT    CompressibilityPhasicitySpontaneityPropertiesThrombus Aging +---------+---------------+---------+-----------+----------+--------------+ CFV  Full                                         Pulsatile flow +---------+---------------+---------+-----------+----------+--------------+ SFJ      Full                                                        +---------+---------------+---------+-----------+----------+--------------+ FV Prox  Full                                                        +---------+---------------+---------+-----------+----------+--------------+ FV Mid   Full                                                        +---------+---------------+---------+-----------+----------+--------------+ FV DistalFull                                                        +---------+---------------+---------+-----------+----------+--------------+ PFV      Full                                                        +---------+---------------+---------+-----------+----------+--------------+ POP      Full                                         Pulsatile flow +---------+---------------+---------+-----------+----------+--------------+ PTV      Full                                                        +---------+---------------+---------+-----------+----------+--------------+ PERO     Full                                                        +---------+---------------+---------+-----------+----------+--------------+   +---------+---------------+---------+-----------+----------+--------------+ LEFT      CompressibilityPhasicitySpontaneityPropertiesThrombus Aging +---------+---------------+---------+-----------+----------+--------------+ CFV      Full                                         Pulsatile flow +---------+---------------+---------+-----------+----------+--------------+ SFJ      Full                                                        +---------+---------------+---------+-----------+----------+--------------+  FV Prox  Full                                                        +---------+---------------+---------+-----------+----------+--------------+ FV Mid   Full                                                        +---------+---------------+---------+-----------+----------+--------------+ FV DistalFull                                                        +---------+---------------+---------+-----------+----------+--------------+ PFV      Full                                                        +---------+---------------+---------+-----------+----------+--------------+ POP      Full                                         Pulsatile flow +---------+---------------+---------+-----------+----------+--------------+ PTV      Full                                                        +---------+---------------+---------+-----------+----------+--------------+ PERO     Full                                                        +---------+---------------+---------+-----------+----------+--------------+     Summary: Right: There is no evidence of deep vein thrombosis in the lower extremity. However, portions of this examination were limited- see technologist comments above. Left: There is no evidence of deep vein thrombosis in the lower extremity. However, portions of this examination were limited- see technologist comments above. Significant fluid noted in the posterior calf, etiology unknown.  *See table(s) above for measurements  and observations.    Preliminary         Scheduled Meds: . aspirin EC  81 mg Oral Daily  . atorvastatin  40 mg Oral q1800  . furosemide  40 mg Intravenous Q12H  . losartan  25 mg Oral Daily  . metoprolol succinate  50 mg Oral Daily  . potassium chloride  10 mEq Oral BID  . ursodiol  300 mg Oral BID  . Warfarin - Pharmacist Dosing Inpatient   Does not apply q1800   Continuous Infusions:   LOS: 2 days   The patient is critically ill with multiple organ systems failure and requires high complexity decision making  for assessment and support, frequent evaluation and titration of therapies, application of advanced monitoring technologies and extensive interpretation of multiple databases. Critical Care Time devoted to patient care services described in this note  Time spent: 40 minutes     Miyana Mordecai, Geraldo Docker, MD [facility] Pager [phone]  If 7PM-7AM, please contact night-coverage [email or web address] Password [facility] 01/26/2019, 7:58 AM

## 2019-01-26 NOTE — Plan of Care (Signed)
Patient's B/P beginning to come up slowly but she continues to run temps, blood and urine cultures pending. Patient is now having loose stools, will try to save a specimen to send for testing. Patient is not voiding but has not been eating or drinking much and B/P too soft to give her LAsix IVP, will continue to monitor.

## 2019-01-26 NOTE — Progress Notes (Signed)
[place] for Coumadin Indication: atrial fibrillation and Mechanical valve  Vital Signs: Temp: 98.7 F (37.1 C) (09/18 0500) Temp Source: Oral (09/18 0500) BP: 89/52 (09/18 0849) Pulse Rate: 80 (09/18 0817)  Labs: Recent Labs    01/24/19 0259 01/24/19 0316 01/24/19 0738 01/24/19 0807 01/24/19 1116 01/25/19 0217 01/26/19 0354  HGB  --  11.7*  --   --   --  10.7* 12.0  HCT  --  37.1  --   --   --  34.0* 38.8  PLT  --  68*  --   --   --  64* 59*  LABPROT  --   --   --   --  28.8* 37.3* 33.8*  INR  --   --   --   --  2.8* 3.9* 3.4*  CREATININE  --  1.38*  --   --   --  1.50* 1.70*  TROPONINIHS 132*  --  111* 113*  --   --   --     Estimated Creatinine Clearance: 27.9 mL/min (A) (by C-G formula based on SCr of 1.7 mg/dL (H)).  Assessment: 68 y.o. female admitted with CHF/SOB, h/o Afib and mechanical MVR, to continue Coumadin.  Of note, outpatient INR goal noted to be 2-3. However, she does have a mechanical mitral valve along with afib which would require an INR goal of 2.5-3.5. Per discussion with MD, will proceed with an INR goal of 2.5-3.5 for now. This may require a change to her outpatient warfarin regimen at discharge.   INR today is down to 3.4, Hgb 12, plt 59 (watching closely). No s/sx of bleeding.   PTA regimen is 4 mg daily.  Goal of Therapy:  INR 2.5-3.5 Monitor platelets by anticoagulation protocol: Yes   Plan:  Warfarin 2 mg again tonight Follow platelet count in am Daily INR  Erin Hearing PharmD., BCPS Clinical Pharmacist 01/26/2019 2:54 PM  Please check AMION for all [place] phone numbers After 10:00 PM, call [place] [phone] 01/26/2019,2:52 PM

## 2019-01-27 DIAGNOSIS — I5023 Acute on chronic systolic (congestive) heart failure: Secondary | ICD-10-CM | POA: Diagnosis present

## 2019-01-27 DIAGNOSIS — N179 Acute kidney failure, unspecified: Secondary | ICD-10-CM | POA: Diagnosis present

## 2019-01-27 DIAGNOSIS — L03116 Cellulitis of left lower limb: Secondary | ICD-10-CM | POA: Diagnosis present

## 2019-01-27 DIAGNOSIS — K743 Primary biliary cirrhosis: Secondary | ICD-10-CM | POA: Diagnosis present

## 2019-01-27 DIAGNOSIS — I482 Chronic atrial fibrillation, unspecified: Secondary | ICD-10-CM | POA: Diagnosis present

## 2019-01-27 LAB — CBC
HCT: 37.7 % (ref 36.0–46.0)
Hemoglobin: 11.9 g/dL — ABNORMAL LOW (ref 12.0–15.0)
MCH: 28.5 pg (ref 26.0–34.0)
MCHC: 31.6 g/dL (ref 30.0–36.0)
MCV: 90.4 fL (ref 80.0–100.0)
Platelets: 70 10*3/uL — ABNORMAL LOW (ref 150–400)
RBC: 4.17 MIL/uL (ref 3.87–5.11)
RDW: 16.2 % — ABNORMAL HIGH (ref 11.5–15.5)
WBC: 10.8 10*3/uL — ABNORMAL HIGH (ref 4.0–10.5)
nRBC: 0 % (ref 0.0–0.2)

## 2019-01-27 LAB — COMPREHENSIVE METABOLIC PANEL
ALT: 15 U/L (ref 0–44)
AST: 31 U/L (ref 15–41)
Albumin: 2.4 g/dL — ABNORMAL LOW (ref 3.5–5.0)
Alkaline Phosphatase: 69 U/L (ref 38–126)
Anion gap: 12 (ref 5–15)
BUN: 52 mg/dL — ABNORMAL HIGH (ref 8–23)
CO2: 18 mmol/L — ABNORMAL LOW (ref 22–32)
Calcium: 8.7 mg/dL — ABNORMAL LOW (ref 8.9–10.3)
Chloride: 106 mmol/L (ref 98–111)
Creatinine, Ser: 1.54 mg/dL — ABNORMAL HIGH (ref 0.44–1.00)
GFR calc Af Amer: 40 mL/min — ABNORMAL LOW (ref >60)
GFR calc non Af Amer: 35 mL/min — ABNORMAL LOW (ref >60)
Glucose, Bld: 76 mg/dL (ref 70–99)
Potassium: 4.6 mmol/L (ref 3.5–5.1)
Sodium: 136 mmol/L (ref 135–145)
Total Bilirubin: 2 mg/dL — ABNORMAL HIGH (ref 0.3–1.2)
Total Protein: 8.1 g/dL (ref 6.5–8.1)

## 2019-01-27 LAB — MAGNESIUM: Magnesium: 2.1 mg/dL (ref 1.7–2.4)

## 2019-01-27 LAB — PROTIME-INR
INR: 3.8 — ABNORMAL HIGH (ref 0.8–1.2)
Prothrombin Time: 36.5 seconds — ABNORMAL HIGH (ref 11.4–15.2)

## 2019-01-27 MED ORDER — WARFARIN SODIUM 4 MG PO TABS
4.0000 mg | ORAL_TABLET | Freq: Once | ORAL | Status: AC
  Administered 2019-01-27: 17:00:00 4 mg via ORAL
  Filled 2019-01-27: qty 1

## 2019-01-27 MED ORDER — SODIUM CHLORIDE 0.9 % IV SOLN
2.0000 g | INTRAVENOUS | Status: DC
  Administered 2019-01-27 – 2019-02-01 (×6): 2 g via INTRAVENOUS
  Filled 2019-01-27: qty 2
  Filled 2019-01-27: qty 20
  Filled 2019-01-27 (×4): qty 2

## 2019-01-27 NOTE — Progress Notes (Signed)
ANTICOAGULATION CONSULT NOTE  Pharmacy Consult for warfarin Indication: atrial fibrillation and mechanical MVR  Vital Signs: Temp: 99.4 F (37.4 C) (09/19 0648) Temp Source: Oral (09/19 0500) BP: 107/55 (09/19 0813) Pulse Rate: 91 (09/19 0813)  Labs: Recent Labs    01/25/19 0217 01/26/19 0354 01/27/19 0421  HGB 10.7* 12.0 11.9*  HCT 34.0* 38.8 37.7  PLT 64* 59* 70*  LABPROT 37.3* 33.8* 36.5*  INR 3.9* 3.4* 3.8*  CREATININE 1.50* 1.70* 1.54*    Estimated Creatinine Clearance: 30.8 mL/min (A) (by C-G formula based on SCr of 1.54 mg/dL (H)).  Assessment: 68 y.o. female admitted with CHF/SOB, h/o Afib and mechanical MVR, to continue warfarin.  Of note, outpatient INR goal noted to be 2-3. However, she does have a mechanical mitral valve along with afib which would require an INR goal of 2.5-3.5. Per discussion with MD, will proceed with an INR goal of 2.5-3.5 for now. This may require a change to her outpatient warfarin regimen at discharge.   INR is therapeutic at 3.8 today; per [facility] she missed her dose last night for unknown reason. Hgb 11.9, platelets up 70 (watching closely). No s/sx of bleeding.   PTA regimen is 4 mg daily.  Goal of Therapy:  INR 2.5-3.5 Monitor platelets by anticoagulation protocol: Yes   Plan:  Warfarin 4 mg PO tonight Daily INR Monitor for s/sx of bleeding, platelet count   Thank you for involving pharmacy in this patient's care.  Renold Genta, PharmD, BCPS Clinical Pharmacist Clinical phone for 01/27/2019 until 3p is [phone] 01/27/2019 1:46 PM  **Pharmacist phone directory can be found on [email or web address] listed under [place]**

## 2019-01-27 NOTE — Progress Notes (Signed)
Progress Note  Patient Name: Marie Malone Date of Encounter: 01/27/2019  Primary Cardiologist:  [name], MD  Subjective   Breathing is OK   No CP    Inpatient Medications    Scheduled Meds:  aspirin EC  81 mg Oral Daily   atorvastatin  40 mg Oral q1800   metoprolol succinate  25 mg Oral Daily   warfarin  2 mg Oral ONCE-1800   Warfarin - Pharmacist Dosing Inpatient   Does not apply q1800   Continuous Infusions:  sodium chloride 100 mL (01/26/19 0857)   cefTRIAXone (ROCEPHIN)  IV     PRN Meds: acetaminophen **OR** acetaminophen, ondansetron **OR** ondansetron (ZOFRAN) IV    Vital Signs    Vitals:   01/27/19 0009 01/27/19 0500 01/27/19 0648 01/27/19 0813  BP: 119/65 (!) 100/53 (!) 107/55 (!) 107/55  Pulse: 87 97 93 91  Resp: 20 (!) 25 (!) 24   Temp: 99.2 F (37.3 C) (!) 101.4 F (38.6 C) 99.4 F (37.4 C)   TempSrc: Axillary Oral    SpO2: 100% 100%    Weight:  62.7 kg    Height:        Intake/Output Summary (Last 24 hours) at 01/27/2019 1037 Last data filed at 01/26/2019 1134 Gross per 24 hour  Intake 120 ml  Output --  Net 120 ml   Filed Weights   01/25/19 0412 01/27/19 0500  Weight: 62.7 kg 62.7 kg   Last Weight  Most recent update: 01/27/2019  5:09 AM   Weight  62.7 kg (138 lb 3.7 oz)           Weight change:    Telemetry    SR,- Personally Reviewed  ECG    No new EKG  - Personally Reviewed  Physical Exam   General: frail elderly, female in moderate distress Head: Eyes PERRLA Normocephalic and atraumatic Lungs: decreased BS bases. Heart: HRRR S1 S2, without rub or gallop. 2/6 murmur.  Pulses are 1+ & equal. L>R JVD. Abdomen: Bowel sounds are present but decreased, abdomen soft and diffusely tender without masses or  hernias noted. Msk: Normal strength and tone for age. Extremities: No clubbing, cyanosis  No LE  edema Skin:  No rashes or lesions noted. Neuro: Alert and oriented X 2.  Labs    Hematology Recent Labs   Lab 01/25/19 0217 01/26/19 0354 01/27/19 0421  WBC 9.0 7.0 10.8*  RBC 3.71* 4.17 4.17  HGB 10.7* 12.0 11.9*  HCT 34.0* 38.8 37.7  MCV 91.6 93.0 90.4  MCH 28.8 28.8 28.5  MCHC 31.5 30.9 31.6  RDW 15.9* 16.3* 16.2*  PLT 64* 59* 70*    Chemistry Recent Labs  Lab 01/25/19 0217 01/26/19 0354 01/27/19 0421  NA 137 138 136  K 4.3 4.5 4.6  CL 108 107 106  CO2 19* 19* 18*  GLUCOSE 113* 78 76  BUN 33* 45* 52*  CREATININE 1.50* 1.70* 1.54*  CALCIUM 8.3* 8.8* 8.7*  PROT 7.7 8.7* 8.1  ALBUMIN 2.7* 2.7* 2.4*  AST 38 35 31  ALT 15 17 15   ALKPHOS 42 52 69  BILITOT 1.9* 2.5* 2.0*  GFRNONAA 36* 31* 35*  GFRAA 41* 36* 40*  ANIONGAP 10 12 12     Magnesium  Date Value Ref Range Status  01/27/2019 2.1 1.7 - 2.4 mg/dL Final    Comment:    Performed at [facility], [address] [place], [place] [address]   High Sensitivity Troponin:   Recent Labs  Lab 01/28/2019 2211 01/24/19 0259 01/24/19 0738 01/24/19 0807  TROPONINIHS 20* 132* 111* 113*     BNP Recent Labs  Lab 01/13/2019 2212  BNP 1,699.1*     Lab Results  Component Value Date   INR 3.8 (H) 01/27/2019   INR 3.4 (H) 01/26/2019   INR 3.9 (H) 01/25/2019   PROTIME 21.1 (A) 06/04/2016   PROTIME 24.6 (A) 06/03/2016   PROTIME 28.8 (A) 06/02/2016    Radiology    Dg Chest Port 1 View  Result Date: 01/25/2019 CLINICAL DATA:  Fevers EXAM: PORTABLE CHEST 1 VIEW COMPARISON:  02/03/2019 FINDINGS: Cardiac shadow remains enlarged. Postsurgical changes are again seen. Improvement in the vascular congestion is noted. Small right pleural effusion is again seen. No focal confluent infiltrate is noted. IMPRESSION: Improved vascular congestion. Persistent right effusion. Electronically Signed   By: Inez Catalina M.D.   On: 01/25/2019 01:34   Dg Chest Port 1 View  Result Date: 01/13/2019 CLINICAL DATA:  Shortness of breath, patient on BiPAP EXAM: PORTABLE CHEST 1 VIEW COMPARISON:  Radiograph 07/05/2016 FINDINGS: Postsurgical  changes related to prior CABG including intact and aligned sternotomy wires and multiple surgical clips projecting over the mediastinum. Aortic valve replacement is noted. Atrial occlusion clip is present. Borderline cardiomegaly. Small right pleural effusion is present. There is diffuse interstitial opacities throughout the lungs, increased from prior studies. Central peribronchovascular thickening is noted as well with cephalized, indistinct vascularity. No focal consolidative process. No pneumothorax. No acute osseous or soft tissue abnormality. IMPRESSION: Findings favor CHF/volume overload with mild cardiomegaly, interstitial pulmonary edema with small right pleural effusion. Electronically Signed   By: Lovena Le M.D.   On: 01/22/2019 22:22   Vas [place] Lower Extremity Venous (dvt)  Result Date: 01/25/2019  Lower Venous Study Indications: Pain, and Swelling, interstitial edema.  Limitations: Poor ultrasound/tissue interface and patient's pain with touch. Comparison Study: No prior study on file for comparison Performing Technologist: Sharion Dove RVS  Examination Guidelines: A complete evaluation includes B-mode imaging, spectral Doppler, color Doppler, and power Doppler as needed of all accessible portions of each vessel. Bilateral testing is considered an integral part of a complete examination. Limited examinations for reoccurring indications may be performed as noted.  +---------+---------------+---------+-----------+----------+--------------+  RIGHT     Compressibility Phasicity Spontaneity Properties Thrombus Aging  +---------+---------------+---------+-----------+----------+--------------+  CFV       Full                                             Pulsatile flow  +---------+---------------+---------+-----------+----------+--------------+  SFJ       Full                                                             +---------+---------------+---------+-----------+----------+--------------+  FV Prox    Full                                                             +---------+---------------+---------+-----------+----------+--------------+  FV Mid    Full                                                             +---------+---------------+---------+-----------+----------+--------------+  FV Distal Full                                                             +---------+---------------+---------+-----------+----------+--------------+  PFV       Full                                                             +---------+---------------+---------+-----------+----------+--------------+  POP       Full                                             Pulsatile flow  +---------+---------------+---------+-----------+----------+--------------+  PTV       Full                                                             +---------+---------------+---------+-----------+----------+--------------+  PERO      Full                                                             +---------+---------------+---------+-----------+----------+--------------+   +---------+---------------+---------+-----------+----------+--------------+  LEFT      Compressibility Phasicity Spontaneity Properties Thrombus Aging  +---------+---------------+---------+-----------+----------+--------------+  CFV       Full                                             Pulsatile flow  +---------+---------------+---------+-----------+----------+--------------+  SFJ       Full                                                             +---------+---------------+---------+-----------+----------+--------------+  FV Prox   Full                                                             +---------+---------------+---------+-----------+----------+--------------+  FV Mid    Full                                                             +---------+---------------+---------+-----------+----------+--------------+  FV Distal Full                                                              +---------+---------------+---------+-----------+----------+--------------+  PFV       Full                                                             +---------+---------------+---------+-----------+----------+--------------+  POP       Full                                             Pulsatile flow  +---------+---------------+---------+-----------+----------+--------------+  PTV       Full                                                             +---------+---------------+---------+-----------+----------+--------------+  PERO      Full                                                             +---------+---------------+---------+-----------+----------+--------------+     Summary: Right: There is no evidence of deep vein thrombosis in the lower extremity. However, portions of this examination were limited- see technologist comments above. Left: There is no evidence of deep vein thrombosis in the lower extremity. However, portions of this examination were limited- see technologist comments above. Significant fluid noted in the posterior calf, etiology unknown.  *See table(s) above for measurements and observations.    Preliminary      Cardiac Studies   ECHO:  01/24/2019  1. Left ventricular ejection fraction, by visual estimation, is 45 to 50%. The left ventricle has mildly decreased function. Normal left ventricular size. Left ventricular septal wall thickness was normal. Normal left ventricular posterior wall thickness. There is no left ventricular hypertrophy.  2. Indeterminate diastolic function due to mechanical mitral valve pattern of LV diastolic filling.  3. Right ventricular pressure overload and right ventricular volume overload.  4. Global right ventricle has moderately reduced systolic function.The right ventricular size is moderately enlarged. No increase in right ventricular wall thickness.  5. Left atrial size was moderately dilated.  6. Right atrial size was  severely dilated.  7. The mitral valve has been repaired/replaced. No evidence of mitral valve regurgitation. No evidence of mitral stenosis.  8. The tricuspid valve is normal in structure. Tricuspid valve regurgitation mild-moderate.  9. The aortic valve The aortic valve is normal in structure. Aortic valve regurgitation was not visualized by color flow Doppler. Structurally normal aortic valve, with no evidence of sclerosis or stenosis. 10. The pulmonic valve was normal in structure. Pulmonic  valve regurgitation is mild by color flow Doppler. 11. Moderately elevated pulmonary artery systolic pressure. 12. The inferior vena cava is dilated in size with <50% respiratory variability, suggesting right atrial pressure of 15 mmHg.  Patient Profile     68 y.o. 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   Assessment & Plan    1. Acute on chronic CHF:   LVEF 45 to 50%  Lasix held  Yesterday  BP is a little better   ON Toprol XL  Was on losartan 25 and lasix 40/20  piro to admit  I would hold for now  BUN/ CR still coming down     2. PAF  Pt is s/p MAZE procedure  - spontaneous conversion to SR  Remains in SR   - INR is therapeutic  3. S/p mech MV - see results above   Continue anticoagulation    4. RV dysfunction - may be related to [place], which is felt 2nd longstanding MV dz - watch for OSA, desats while asleep  5. Hypotension - BP improved   6  CAD   S/p CABG x 1    No symptoms to sugg angina    7   HL  Cotninue lipitor    8  Renal  BUN/Cr 52 and 1.54 today  Improved some   Follow   Hold lasix    Richrd Humbles , PA-C 10:37 AM 01/27/2019 Pager: [phone]

## 2019-01-27 NOTE — Progress Notes (Signed)
PROGRESS NOTE    Marie Malone  XVQ:[id number] DOB: 1951/04/07 DOA: 01/31/2019 PCP: Debbrah Alar, NP   Brief Narrative:  Marie Malone is a 68 y.o. 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  Patient states over the last 2 weeks patient has been getting progressively short of breath on exertion.  Denies any chest pain fever chills or productive cough.  Patient's primary care physician advised her to increase her Lasix twice daily which she is supposed to start today.  Because of worsening shortness of breath patient came to the ER.  EMS was called and patient initially was placed on BiPAP.  Patient also had complained of some left calf cramping.  ED Course: In the ER patient was hypoxic placed on BiPAP chest x-ray shows features consistent with CHF.  Lab work show a BNP of 1699, sodium 139 potassium 4.2 creatinine 1.1 WBC count of 7.6 hemoglobin 12.6 platelets 82 and EKG was showing A. fib rate at 106 bpm.  Patient was given 60 mg IV Lasix following which patient was able to be weaned off BiPAP and presently is on 5 L oxygen.  Patient admitted for further management of acute CHF.  COVID-19 test was negative.  On exam patient has good peripheral pulses with some edema in the lower extremity.   Subjective: 9/19 T-max last 24 hours 38.6 C.  A/O x4, negative CP, negative abdominal pain, negative S OB, negative N/V.  L LE pain continues.   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   S/P CABG x 1   Acute on chronic systolic CHF (congestive heart failure) (HCC)   Atrial fibrillation, chronic  Cellulitis of left leg   Acute renal failure (HCC)   Primary biliary cirrhosis (HCC)  Acute respiratory failure with hypoxia -Not on home O2 -Titrate O2 to maintain SPO2 89 to 93%  Acute on chronic systolic CHF -Strict in and out +451ml -Daily weight Filed Weights   01/25/19 0412 01/27/19 0500  Weight: 62.7 kg 62.7 kg  - EF 40 to 45% see echocardiogram results below - Per cardiology discontinue Lasix, ARB, and K-Dur -Decrease metoprolol 25 mg daily  Mechanical mitral valve - Continue warfarin, monitor INR Recent Labs  Lab 02/02/2019 2211 01/24/19 1116 01/25/19 0217 01/26/19 0354 01/27/19 0421  INR 2.2* 2.8* 3.9* 3.4* 3.8*  -Therapeutic INR goal 2.5-3.5 -INR slightly above goal monitor closely  Atrial fibrillation chronic - Currently NSR - Anticoagulated for her mechanical mitral valve  L LE cellulitis vs SIRS -Patient with extremely discolored, warm to touch, painful to touch, L LE - Blood culture, urine culture NGTD  - Patient also with loose stools (possibly from Ursodiol) however patient has been chronically on this medication. -C. difficile PCR negative - Once all stool and cultures drawn.  Start empiric antibiotic initially for cellulitis.  - See CHF and PBS  - 9/19 start empiric antibiotic treatment  Acute renal failure (baseline Cr 1.0) Recent Labs  Lab 01/16/2019 2250 01/24/19 0316 01/25/19 0217 01/26/19 0354 01/27/19 0421  CREATININE 1.00 1.38* 1.50* 1.70* 1.54*  -Most likely secondary to overdiuresis and diarrhea. - 9/19 and decrease normal  saline 12ml/hr  Primary biliary cirrhosis -Ursodial 300 mg BID (hold)    DVT prophylaxis: Warfarin Code Status: Full Family Communication: 9/18 daughter at bedside discussed plan of care answered all questions Disposition Plan: TBD   Consultants:  Cardiology    Procedures/Significant Events:  9/16 echocardiogram:Left ventricular ejection fraction, EF = 45 to 50%.  - Indeterminate diastolic function  due to mechanical mitral valve pattern of LV diastolic filling. -Right ventricular pressure overload and right ventricular volume overload. -Global right ventricle has moderately reduced systolic function.The right ventricular size is moderately enlarged.  -Left atrial  moderately dilated. -Right atrial severely dilated. -The mitral valve has been repaired/replaced. No evidence of mitral valve regurgitation. No evidence of mitral stenosis. -The tricuspid valve is normal in structure. Tricuspid valve regurgitation mild-moderate. - Moderately elevated pulmonary artery systolic pressure. -The inferior vena cava is dilated in size with <50% respiratory variability, suggesting right atrial pressure of 15 mmHg. 9/17 bilateral lower extremity Doppler;Right: There is no evidence of deep vein thrombosis in the lower extremity. However, portions of this examination were limited- see technologist comments above. Left: There is no evidence of deep vein thrombosis in the lower extremity. However, portions of this examination were limited- see technologist comments above. Significant fluid noted in the posterior calf, etiology unknown   I have personally reviewed and interpreted all radiology studies and my findings are as above.  VENTILATOR SETTINGS:    Cultures 9/17 blood RIGHT hand NGTD 9/17 blood LEFT hand NGTD 9/17 urine positive multiple bacterial morphotypes present, none predominant 9/18 blood pending 9/18 urine pending 9/18 C. difficile C. difficile antigen negative, C. difficile toxin negative    Antimicrobials: Anti-infectives (From admission, onward)   Start     Dose/Rate Route Frequency Ordered Stop   01/27/19 0930  cefTRIAXone (ROCEPHIN) 2 g in sodium chloride 0.9 % 100 mL IVPB     2 g 200 mL/hr over 30 Minutes Intravenous Every 24 hours 01/27/19 0824         Devices    LINES / TUBES:      Continuous Infusions: . sodium chloride 100 mL (01/26/19 0857)  .  cefTRIAXone (ROCEPHIN)  IV 2 g (01/27/19 1238)     Objective: Vitals:   01/27/19 0500 01/27/19 0648 01/27/19 0813 01/27/19 1446  BP: (!) 100/53 (!) 107/55 (!) 107/55 110/65  Pulse: 97 93 91 89  Resp: (!) 25 (!) 24  (!) 21  Temp: (!) 101.4 F (38.6 C) 99.4 F (37.4 C)  98.6 F (37 C)  TempSrc: Oral   Oral  SpO2: 100%   100%  Weight: 62.7 kg     Height:       No intake or output data in the 24 hours ending 01/27/19 1751 Filed Weights   01/25/19 0412 01/27/19 0500  Weight: 62.7 kg 62.7 kg   Physical Exam:  General: A/O x4, positive acute respiratory distress Eyes: negative scleral hemorrhage, negative anisocoria, negative icterus ENT: Negative Runny nose, negative gingival bleeding, Neck:  Negative scars, masses, torticollis, lymphadenopathy, JVD Lungs: Clear to auscultation bilaterally without wheezes or crackles Cardiovascular: Regular rate and rhythm without murmur gallop or rub normal S1 and S2 Abdomen: negative abdominal pain, nondistended, positive soft, bowel sounds, no rebound, no ascites, no appreciable mass Extremities: Pain to palpation LLE calf extending cephalad to mid thigh.  Continues warm to touch, Pur de orange feel Skin: Negative rashes, lesions, ulcers Psychiatric:  Negative depression, negative anxiety, negative fatigue, negative mania  Central nervous system:  Cranial nerves  II through XII intact, tongue/uvula midline, all extremities muscle strength 5/5, sensation intact throughout, negative dysarthria, negative expressive aphasia, negative receptive aphasia.    Data Reviewed: Care during the described time interval was provided by me .  I have reviewed this patient's available data, including medical history, events of note, physical examination, and all test results as part of my evaluation.   CBC: Recent Labs  Lab 01/14/2019 2211  01/24/19 0224 01/24/19 0316 01/25/19 0217 01/26/19 0354 01/27/19 0421  WBC 7.6  --   --  10.9* 9.0 7.0 10.8*   NEUTROABS 5.7  --   --  9.5* 7.6  --   --   HGB 12.6   < > 12.9 11.7* 10.7* 12.0 11.9*  HCT 41.6   < > 38.0 37.1 34.0* 38.8 37.7  MCV 97.7  --   --  92.1 91.6 93.0 90.4  PLT 82*  --   --  68* 64* 59* 70*   < > = values in this interval not displayed.   Basic Metabolic Panel: Recent Labs  Lab 01/15/2019 2211  01/28/2019 2250 01/24/19 0224 01/24/19 0316 01/25/19 0217 01/26/19 0354 01/27/19 0421  NA 139   < > 144 142 139 137 138 136  K 4.2   < > 4.2 4.2 3.9 4.3 4.5 4.6  CL 106  --  110  --  109 108 107 106  CO2 21*  --   --   --  17* 19* 19* 18*  GLUCOSE 92  --  85  --  106* 113* 78 76  BUN 21  --  25*  --  24* 33* 45* 52*  CREATININE 1.17*  --  1.00  --  1.38* 1.50* 1.70* 1.54*  CALCIUM 8.8*  --   --   --  8.6* 8.3* 8.8* 8.7*  MG  --   --   --   --   --   --  2.0 2.1   < > = values in this interval not displayed.   GFR: Estimated Creatinine Clearance: 30.8 mL/min (A) (by C-G formula based on SCr of 1.54 mg/dL (H)). Liver Function Tests: Recent Labs  Lab 01/14/2019 2211 01/24/19 0316 01/25/19 0217 01/26/19 0354 01/27/19 0421  AST 47* 54* 38 35 31  ALT 19 18 15 17 15   ALKPHOS 60 53 42 52 69  BILITOT 1.5* 1.8* 1.9* 2.5* 2.0*  PROT 9.6* 8.6* 7.7 8.7* 8.1  ALBUMIN 3.4* 3.1* 2.7* 2.7* 2.4*   No results for input(s): LIPASE, AMYLASE in the last 168 hours. No results for input(s): AMMONIA in the last 168 hours. Coagulation Profile: Recent Labs  Lab 01/27/2019 2211 01/24/19 1116 01/25/19 0217 01/26/19 0354 01/27/19 0421  INR 2.2* 2.8* 3.9* 3.4* 3.8*   Cardiac Enzymes: No results for input(s): CKTOTAL, CKMB, CKMBINDEX, TROPONINI in the last 168 hours. BNP (last 3 results) No results for input(s): PROBNP in the last 8760 hours. HbA1C: No results for input(s): HGBA1C in the last 72 hours. CBG: No results for input(s): GLUCAP in the last 168 hours. Lipid Profile: No results for input(s): CHOL, HDL, LDLCALC, TRIG, CHOLHDL, LDLDIRECT in the last 72 hours. Thyroid Function  Tests: No results for input(s): TSH, T4TOTAL, FREET4, T3FREE, THYROIDAB in the last 72 hours. Anemia Panel: No results for input(s): VITAMINB12, FOLATE, FERRITIN, TIBC, IRON, RETICCTPCT in the last 72 hours. Urine analysis:    Component Value Date/Time   COLORURINE YELLOW 01/25/2019 0153   APPEARANCEUR HAZY (A) 01/25/2019 0153   LABSPEC 1.018  01/25/2019 0153   PHURINE 5.0 01/25/2019 0153   GLUCOSEU NEGATIVE 01/25/2019 0153   GLUCOSEU NEGATIVE 05/15/2018 1525   HGBUR NEGATIVE 01/25/2019 0153   BILIRUBINUR NEGATIVE 01/25/2019 0153   BILIRUBINUR NEG 11/07/2017 1013   KETONESUR NEGATIVE 01/25/2019 0153   PROTEINUR NEGATIVE 01/25/2019 0153   UROBILINOGEN 1.0 05/15/2018 1525   NITRITE NEGATIVE 01/25/2019 0153   LEUKOCYTESUR NEGATIVE 01/25/2019 0153   Sepsis Labs: @LABRCNTIP (procalcitonin:4,lacticidven:4)  ) Recent Results (from the past 240 hour(s))  SARS Coronavirus 2 [facility] order, Performed in [facility] hospital lab) Nasopharyngeal Nasopharyngeal Swab     Status: None   Collection Time: 01/22/2019 10:12 PM   Specimen: Nasopharyngeal Swab  Result Value Ref Range Status   SARS Coronavirus 2 NEGATIVE NEGATIVE Final    Comment: (NOTE) If result is NEGATIVE SARS-CoV-2 target nucleic acids are NOT DETECTED. The SARS-CoV-2 RNA is generally detectable in upper and lower  respiratory specimens during the acute phase of infection. The lowest  concentration of SARS-CoV-2 viral copies this assay can detect is 250  copies / mL. A negative result does not preclude SARS-CoV-2 infection  and should not be used as the sole basis for treatment or other  patient management decisions.  A negative result may occur with  improper specimen collection / handling, submission of specimen other  than nasopharyngeal swab, presence of viral mutation(s) within the  areas targeted by this assay, and inadequate number of viral copies  (<250 copies / mL). A negative result must be combined with clinical   observations, patient history, and epidemiological information. If result is POSITIVE SARS-CoV-2 target nucleic acids are DETECTED. The SARS-CoV-2 RNA is generally detectable in upper and lower  respiratory specimens dur ing the acute phase of infection.  Positive  results are indicative of active infection with SARS-CoV-2.  Clinical  correlation with patient history and other diagnostic information is  necessary to determine patient infection status.  Positive results do  not rule out bacterial infection or co-infection with other viruses. If result is PRESUMPTIVE POSTIVE SARS-CoV-2 nucleic acids MAY BE PRESENT.   A presumptive positive result was obtained on the submitted specimen  and confirmed on repeat testing.  While 2019 novel coronavirus  (SARS-CoV-2) nucleic acids may be present in the submitted sample  additional confirmatory testing may be necessary for epidemiological  and / or clinical management purposes  to differentiate between  SARS-CoV-2 and other Sarbecovirus currently known to infect humans.  If clinically indicated additional testing with an alternate test  methodology [phone]) is advised. The SARS-CoV-2 RNA is generally  detectable in upper and lower respiratory sp ecimens during the acute  phase of infection. The expected result is Negative. Fact Sheet for Patients:  StrictlyIdeas.no Fact Sheet for Healthcare Providers: BankingDealers.co.za This test is not yet approved or cleared by the [place] FDA and has been authorized for detection and/or diagnosis of SARS-CoV-2 by FDA under an Emergency Use Authorization (EUA).  This EUA will remain in effect (meaning this test can be used) for the duration of the COVID-19 declaration under Section 564(b)(1) of the Act, 21 U.S.C. section 360bbb-3(b)(1), unless the authorization is terminated or revoked sooner. Performed at [facility], [place] [address],  [place], [place] [address]   Culture, Urine     Status: None   Collection Time: 01/25/19  1:20 AM   Specimen: Urine, Catheterized  Result Value Ref Range Status   Specimen Description URINE, CATHETERIZED  Final   Special Requests   Final  NONE Performed at [facility], [place] [address], [place], [place] [address]    Culture   Final    Multiple bacterial morphotypes present, none predominant. Suggest appropriate recollection if clinically indicated.   Report Status 01/26/2019 FINAL  Final  Culture, blood (routine x 2)     Status: None (Preliminary result)   Collection Time: 01/25/19  2:16 AM   Specimen: BLOOD RIGHT HAND  Result Value Ref Range Status   Specimen Description BLOOD RIGHT HAND  Final   Special Requests   Final    BOTTLES DRAWN AEROBIC AND ANAEROBIC Blood Culture adequate volume   Culture   Final    NO GROWTH 2 DAYS Performed at [facility], [place] [address] [place], [place] [address]    Report Status PENDING  Incomplete  Culture, blood (routine x 2)     Status: None (Preliminary result)   Collection Time: 01/25/19  2:16 AM   Specimen: BLOOD LEFT HAND  Result Value Ref Range Status   Specimen Description BLOOD LEFT HAND  Final   Special Requests   Final    BOTTLES DRAWN AEROBIC AND ANAEROBIC Blood Culture adequate volume   Culture   Final    NO GROWTH 2 DAYS Performed at [facility], [place] [address] [place], [place] [address]    Report Status PENDING  Incomplete  C difficile quick scan w PCR reflex     Status: None   Collection Time: 01/26/19  6:33 PM   Specimen: STOOL  Result Value Ref Range Status   C Diff antigen NEGATIVE NEGATIVE Final   C Diff toxin NEGATIVE NEGATIVE Final   C Diff interpretation No C. difficile detected.  Final    Comment: Performed at [facility], [place] [address] [place], [place] [address]  Culture, blood (routine x 2)     Status: None (Preliminary result)   Collection Time: 01/26/19  8:10 PM   Specimen: BLOOD   Result Value Ref Range Status   Specimen Description BLOOD BLOOD RIGHT FOREARM  Final   Special Requests   Final    BOTTLES DRAWN AEROBIC AND ANAEROBIC Blood Culture adequate volume   Culture   Final    NO GROWTH < 24 HOURS Performed at [facility], [place] [address], [place], [place] [address]    Report Status PENDING  Incomplete  Culture, blood (routine x 2)     Status: None (Preliminary result)   Collection Time: 01/26/19  8:25 PM   Specimen: BLOOD LEFT HAND  Result Value Ref Range Status   Specimen Description BLOOD LEFT HAND  Final   Special Requests   Final    BOTTLES DRAWN AEROBIC ONLY Blood Culture adequate volume   Culture   Final    NO GROWTH < 24 HOURS Performed at [facility], [place] [address], [place], [place] [address]    Report Status PENDING  Incomplete         Radiology Studies: Vas [place] Lower Extremity Venous (dvt)  Result Date: 01/25/2019  Lower Venous Study Indications: Pain, and Swelling, interstitial edema.  Limitations: Poor ultrasound/tissue interface and patient's pain with touch. Comparison Study: No prior study on file for comparison Performing Technologist: Sharion Dove RVS  Examination Guidelines: A complete evaluation includes B-mode imaging, spectral Doppler, color Doppler, and power Doppler as needed of all accessible portions of each vessel. Bilateral testing is considered an integral part of a complete examination. Limited examinations for reoccurring indications  may be performed as noted.  +---------+---------------+---------+-----------+----------+--------------+ RIGHT    CompressibilityPhasicitySpontaneityPropertiesThrombus Aging +---------+---------------+---------+-----------+----------+--------------+ CFV      Full                                         Pulsatile flow +---------+---------------+---------+-----------+----------+--------------+ SFJ      Full                                                         +---------+---------------+---------+-----------+----------+--------------+ FV Prox  Full                                                        +---------+---------------+---------+-----------+----------+--------------+ FV Mid   Full                                                        +---------+---------------+---------+-----------+----------+--------------+ FV DistalFull                                                        +---------+---------------+---------+-----------+----------+--------------+ PFV      Full                                                        +---------+---------------+---------+-----------+----------+--------------+ POP      Full                                         Pulsatile flow +---------+---------------+---------+-----------+----------+--------------+ PTV      Full                                                        +---------+---------------+---------+-----------+----------+--------------+ PERO     Full                                                        +---------+---------------+---------+-----------+----------+--------------+   +---------+---------------+---------+-----------+----------+--------------+ LEFT     CompressibilityPhasicitySpontaneityPropertiesThrombus Aging +---------+---------------+---------+-----------+----------+--------------+ CFV      Full  Pulsatile flow +---------+---------------+---------+-----------+----------+--------------+ SFJ      Full                                                        +---------+---------------+---------+-----------+----------+--------------+ FV Prox  Full                                                        +---------+---------------+---------+-----------+----------+--------------+ FV Mid   Full                                                         +---------+---------------+---------+-----------+----------+--------------+ FV DistalFull                                                        +---------+---------------+---------+-----------+----------+--------------+ PFV      Full                                                        +---------+---------------+---------+-----------+----------+--------------+ POP      Full                                         Pulsatile flow +---------+---------------+---------+-----------+----------+--------------+ PTV      Full                                                        +---------+---------------+---------+-----------+----------+--------------+ PERO     Full                                                        +---------+---------------+---------+-----------+----------+--------------+     Summary: Right: There is no evidence of deep vein thrombosis in the lower extremity. However, portions of this examination were limited- see technologist comments above. Left: There is no evidence of deep vein thrombosis in the lower extremity. However, portions of this examination were limited- see technologist comments above. Significant fluid noted in the posterior calf, etiology unknown.  *See table(s) above for measurements and observations.    Preliminary         Scheduled Meds: . aspirin EC  81 mg Oral Daily  . atorvastatin  40 mg Oral q1800  . metoprolol succinate  25 mg Oral Daily  . Warfarin - Pharmacist Dosing Inpatient  Does not apply q1800   Continuous Infusions: . sodium chloride 100 mL (01/26/19 0857)  . cefTRIAXone (ROCEPHIN)  IV 2 g (01/27/19 1238)     LOS: 3 days   The patient is critically ill with multiple organ systems failure and requires high complexity decision making for assessment and support, frequent evaluation and titration of therapies, application of advanced monitoring technologies and extensive interpretation of multiple databases.  Critical Care Time devoted to patient care services described in this note  Time spent: 40 minutes     , Geraldo Docker, MD [facility] Pager [phone]  If 7PM-7AM, please contact night-coverage [email or web address] Password [email or web address] 01/27/2019, 5:51 PM

## 2019-01-28 DIAGNOSIS — I5023 Acute on chronic systolic (congestive) heart failure: Secondary | ICD-10-CM

## 2019-01-28 LAB — PROTIME-INR
INR: 4.7 (ref 0.8–1.2)
Prothrombin Time: 43.3 seconds — ABNORMAL HIGH (ref 11.4–15.2)

## 2019-01-28 LAB — BASIC METABOLIC PANEL
Anion gap: 11 (ref 5–15)
BUN: 56 mg/dL — ABNORMAL HIGH (ref 8–23)
CO2: 17 mmol/L — ABNORMAL LOW (ref 22–32)
Calcium: 8.3 mg/dL — ABNORMAL LOW (ref 8.9–10.3)
Chloride: 108 mmol/L (ref 98–111)
Creatinine, Ser: 1.51 mg/dL — ABNORMAL HIGH (ref 0.44–1.00)
GFR calc Af Amer: 41 mL/min — ABNORMAL LOW (ref >60)
GFR calc non Af Amer: 35 mL/min — ABNORMAL LOW (ref >60)
Glucose, Bld: 103 mg/dL — ABNORMAL HIGH (ref 70–99)
Potassium: 4.5 mmol/L (ref 3.5–5.1)
Sodium: 136 mmol/L (ref 135–145)

## 2019-01-28 LAB — CBC
HCT: 36.1 % (ref 36.0–46.0)
Hemoglobin: 11.4 g/dL — ABNORMAL LOW (ref 12.0–15.0)
MCH: 28.4 pg (ref 26.0–34.0)
MCHC: 31.6 g/dL (ref 30.0–36.0)
MCV: 89.8 fL (ref 80.0–100.0)
Platelets: 79 10*3/uL — ABNORMAL LOW (ref 150–400)
RBC: 4.02 MIL/uL (ref 3.87–5.11)
RDW: 16.4 % — ABNORMAL HIGH (ref 11.5–15.5)
WBC: 11.4 10*3/uL — ABNORMAL HIGH (ref 4.0–10.5)
nRBC: 0.3 % — ABNORMAL HIGH (ref 0.0–0.2)

## 2019-01-28 LAB — COMPREHENSIVE METABOLIC PANEL
ALT: 16 U/L (ref 0–44)
AST: 32 U/L (ref 15–41)
Albumin: 2.3 g/dL — ABNORMAL LOW (ref 3.5–5.0)
Alkaline Phosphatase: 65 U/L (ref 38–126)
Anion gap: 10 (ref 5–15)
BUN: 55 mg/dL — ABNORMAL HIGH (ref 8–23)
CO2: 17 mmol/L — ABNORMAL LOW (ref 22–32)
Calcium: 8.4 mg/dL — ABNORMAL LOW (ref 8.9–10.3)
Chloride: 108 mmol/L (ref 98–111)
Creatinine, Ser: 1.64 mg/dL — ABNORMAL HIGH (ref 0.44–1.00)
GFR calc Af Amer: 37 mL/min — ABNORMAL LOW (ref >60)
GFR calc non Af Amer: 32 mL/min — ABNORMAL LOW (ref >60)
Glucose, Bld: 92 mg/dL (ref 70–99)
Potassium: 4.3 mmol/L (ref 3.5–5.1)
Sodium: 135 mmol/L (ref 135–145)
Total Bilirubin: 2 mg/dL — ABNORMAL HIGH (ref 0.3–1.2)
Total Protein: 8.1 g/dL (ref 6.5–8.1)

## 2019-01-28 LAB — NA AND K (SODIUM & POTASSIUM), RAND UR
Potassium Urine: 45 mmol/L
Sodium, Ur: <10 mmol/L

## 2019-01-28 LAB — OSMOLALITY, URINE: Osmolality, Ur: 557 mOsm/kg (ref 300–900)

## 2019-01-28 LAB — OSMOLALITY: Osmolality: 301 mOsm/kg — ABNORMAL HIGH (ref 275–295)

## 2019-01-28 LAB — CHLORIDE, URINE, RANDOM: Chloride Urine: <15 mmol/L

## 2019-01-28 LAB — MAGNESIUM: Magnesium: 2.2 mg/dL (ref 1.7–2.4)

## 2019-01-28 MED ORDER — METOPROLOL SUCCINATE ER 25 MG PO TB24
12.5000 mg | ORAL_TABLET | Freq: Every day | ORAL | Status: DC
  Administered 2019-01-29 – 2019-02-15 (×17): 12.5 mg via ORAL
  Filled 2019-01-28 (×20): qty 1

## 2019-01-28 NOTE — Progress Notes (Signed)
Pt INR 4.7 this am.  Pt also had  6 bts NSVT with am Mg 2.2, K 4.3.  SBP remains 88.  Pt asymptomatic. Arby Barrette, NP with [facility] notified via AMION.  Will await further orders and continue to monitor patient closely.

## 2019-01-28 NOTE — Progress Notes (Signed)
ANTICOAGULATION CONSULT NOTE  Pharmacy Consult for warfarin Indication: atrial fibrillation and mechanical MVR  Vital Signs: Temp: 98.5 F (36.9 C) (09/20 0412) Temp Source: Oral (09/20 0412) BP: 88/53 (09/20 0412) Pulse Rate: 85 (09/20 0412)  Labs: Recent Labs    01/26/19 0354 01/27/19 0421 01/28/19 0441  HGB 12.0 11.9* 11.4*  HCT 38.8 37.7 36.1  PLT 59* 70* 79*  LABPROT 33.8* 36.5* 43.3*  INR 3.4* 3.8* 4.7*  CREATININE 1.70* 1.54* 1.64*    Estimated Creatinine Clearance: 30.1 mL/min (A) (by C-G formula based on SCr of 1.64 mg/dL (H)).  Assessment: 68 y.o. female admitted with CHF/SOB, h/o Afib and mechanical MVR, to continue warfarin. PTA regimen is 4 mg daily.  Of note, outpatient INR goal noted to be 2-3. However, she does have a mechanical mitral valve along with afib which would require an INR goal of 2.5-3.5. Per discussion with MD, will proceed with an INR goal of 2.5-3.5 for now. This may require a change to her outpatient warfarin regimen at discharge.   INR is supratheraputic at 4.7 today. Hgb 11.4, platelets up 79 (watching closely). No reports of bleeding per RN.   Goal of Therapy:  INR 2.5-3.5 Monitor platelets by anticoagulation protocol: Yes   Plan:  No Warfarin tonight Daily INR Monitor for s/sx of bleeding, platelet count   Thank you for involving pharmacy in this patient's care.  Lorel Monaco, PharmD PGY1 Ambulatory Care Resident [employer] # [phone]  **Pharmacist phone directory can be found on [email or web address] listed under [place]**

## 2019-01-28 NOTE — Progress Notes (Signed)
Patient wearing oxygen set at 2lpm with Sp02=99% at this time. Patient has no c/o being short of breath . BIPAP not needed at this time.

## 2019-01-28 NOTE — Progress Notes (Signed)
PROGRESS NOTE    Marie Malone  JFH:[id number] DOB: 05/14/1950 DOA: 01/29/2019 PCP: Debbrah Alar, NP   Brief Narrative:  Marie Malone is a 68 y.o. 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  Patient states over the last 2 weeks patient has been getting progressively short of breath on exertion.  Denies any chest pain fever chills or productive cough.  Patient's primary care physician advised her to increase her Lasix twice daily which she is supposed to start today.  Because of worsening shortness of breath patient came to the ER.  EMS was called and patient initially was placed on BiPAP.  Patient also had complained of some left calf cramping.  ED Course: In the ER patient was hypoxic placed on BiPAP chest x-ray shows features consistent with CHF.  Lab work show a BNP of 1699, sodium 139 potassium 4.2 creatinine 1.1 WBC count of 7.6 hemoglobin 12.6 platelets 82 and EKG was showing A. fib rate at 106 bpm.  Patient was given 60 mg IV Lasix following which patient was able to be weaned off BiPAP and presently is on 5 L oxygen.  Patient admitted for further management of acute CHF.  COVID-19 test was negative.  On exam patient has good peripheral pulses with some edema in the lower extremity.   Subjective: 9/20 last 24 hours afebrile.  Negative CP, negative abdominal pain, negative S OB.  Negative N/V.  States LLE pain significantly reduced.  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   S/P CABG x 1   Acute on chronic systolic CHF  (congestive heart failure) (HCC)   Atrial fibrillation, chronic   Cellulitis of left leg   Acute renal failure ([place])   Primary biliary cirrhosis (HCC)  Acute respiratory failure with hypoxia -Not on home O2 -Titrate O2 to maintain SPO2 89 to 93% -PCXR 9/21 pending  Acute on chronic systolic CHF -Strict in and out +676ml -Daily weight Filed Weights   01/25/19 0412 01/27/19 0500 01/28/19 0412  Weight: 62.7 kg 62.7 kg 68.2 kg  - EF 40 to 45% see echocardiogram results below - Per cardiology discontinue Lasix, ARB, and K-Dur -9/20 decreased metoprolol XR 12.5 mg daily (patient's BP on the hypotensive side)  Mechanical mitral valve - Continue warfarin, monitor INR Recent Labs  Lab 01/24/19 1116 01/25/19 0217 01/26/19 0354 01/27/19 0421 01/28/19 0441  INR 2.8* 3.9* 3.4* 3.8* 4.7*  -Therapeutic INR goal 2.5-3.5 -9/20 supratherapeutic INR most likely secondary to addition of antibiotic, hold Coumadin  Atrial fibrillation chronic - Currently NSR - Anticoagulated for her mechanical mitral valve  L LE cellulitis vs SIRS - Blood culture, urine culture NGTD  - Patient also with loose stools (possibly from Ursodiol) however patient has been chronically on this medication. -C. difficile PCR negative - Once all stool and cultures drawn.  Start empiric antibiotic initially for cellulitis.  - See CHF and PBS  - 9/19 start empiric antibiotic treatment - 9/20 L LE cellulitis improving.  Acute renal failure (baseline Cr 1.0) Recent Labs  Lab 01/24/19 0316 01/25/19 0217 01/26/19 0354  01/27/19 0421 01/28/19 0441  CREATININE 1.38* 1.50* 1.70* 1.54* 1.64*  -Most likely secondary to overdiuresis and diarrhea. - 9/19 decrease normal saline 31ml/hr - 9/20 obtain urine lites; calculate FeNa  Primary biliary cirrhosis -Ursodial 300 mg BID (hold)  Goals of care - On 9/21 place PT/OT request patient was able to ambulate independently at home prior to this hospitalization.  Due to the  severity of the LLE cellulitis unsure patient will be able to discharge home.    DVT prophylaxis: Warfarin Code Status: Full Family Communication: 9/20 daughter at bedside discussed plan of care answered all questions Disposition Plan: TBD   Consultants:  Cardiology    Procedures/Significant Events:  9/16 echocardiogram:Left ventricular ejection fraction, EF = 45 to 50%.  - Indeterminate diastolic function due to mechanical mitral valve pattern of LV diastolic filling. -Right ventricular pressure overload and right ventricular volume overload. -Global right ventricle has moderately reduced systolic function.The right ventricular size is moderately enlarged.  -Left atrial  moderately dilated. -Right atrial severely dilated. -The mitral valve has been repaired/replaced. No evidence of mitral valve regurgitation. No evidence of mitral stenosis. -The tricuspid valve is normal in structure. Tricuspid valve regurgitation mild-moderate. - Moderately elevated pulmonary artery systolic pressure. -The inferior vena cava is dilated in size with <50% respiratory variability, suggesting right atrial pressure of 15 mmHg. 9/17 bilateral lower extremity Doppler;Right: There is no evidence of deep vein thrombosis in the lower extremity. However, portions of this examination were limited- see technologist comments above. Left: There is no evidence of deep vein thrombosis in the lower extremity. However, portions of this examination were limited- see technologist comments above. Significant fluid noted in the posterior calf, etiology unknown   I have personally reviewed and interpreted all radiology studies and my findings are as above.  VENTILATOR SETTINGS:    Cultures 9/17 blood RIGHT hand NGTD 9/17 blood LEFT hand NGTD 9/17 urine positive multiple bacterial morphotypes present, none predominant 9/18 blood RIGHT forearm NGTD 9/18 blood LEFT hand NGTD 9/18 C. difficile C. difficile antigen  negative, C. difficile toxin negative    Antimicrobials: Anti-infectives (From admission, onward)   Start     Dose/Rate Route Frequency Ordered Stop   01/27/19 0930  cefTRIAXone (ROCEPHIN) 2 g in sodium chloride 0.9 % 100 mL IVPB     2 g 200 mL/hr over 30 Minutes Intravenous Every 24 hours 01/27/19 0824         Devices    LINES / TUBES:      Continuous Infusions: . sodium chloride 50 mL/hr at 01/27/19 2022  . cefTRIAXone (ROCEPHIN)  IV 2 g (01/28/19 0930)     Objective: Vitals:   01/27/19 1446 01/27/19 2146 01/28/19 0412 01/28/19 0927  BP: 110/65 (!) 105/54 (!) 88/53 116/70  Pulse: 89 (!) 106 85 88  Resp: (!) 21 (!) 29 19   Temp: 98.6 F (37 C)  98.5 F (36.9 C)   TempSrc: Oral  Oral   SpO2: 100% 96% 98%   Weight:   68.2 kg   Height:        Intake/Output Summary (Last 24 hours) at 01/28/2019 1028 Last data filed at 01/28/2019 0600 Gross per 24 hour  Intake 662 ml  Output 450 ml  Net 212 ml   Filed Weights   01/25/19 0412 01/27/19 0500 01/28/19 0412  Weight: 62.7 kg 62.7 kg 68.2 kg   Physical Exam:  General: A/O x4, positive acute respiratory distress Eyes: negative scleral hemorrhage, negative anisocoria, negative icterus ENT: Negative  Runny nose, negative gingival bleeding, Neck:  Negative scars, masses, torticollis, lymphadenopathy, JVD Lungs: Clear to auscultation bilaterally without wheezes or crackles Cardiovascular: Regular rate and rhythm without murmur gallop or rub normal S1 and S2 Abdomen: negative abdominal pain, nondistended, positive soft, bowel sounds, no rebound, no ascites, no appreciable mass Extremities: Pain to palpation L LE calf extending cephalad to mid thigh (significantly decreased in severity from 9/19), positive Pur de orange feel but decreasing in severity, negative warmth  Skin: Negative rashes, lesions, ulcers Psychiatric:  Negative depression, negative anxiety, negative fatigue, negative mania  Central nervous system:   Cranial nerves II through XII intact, tongue/uvula midline, all extremities muscle strength 5/5, sensation intact  negative dysarthria, negative expressive aphasia, negative receptive aphasia.     Data Reviewed: Care during the described time interval was provided by me .  I have reviewed this patient's available data, including medical history, events of note, physical examination, and all test results as part of my evaluation.   CBC: Recent Labs  Lab 02/06/2019 2211  01/24/19 0316 01/25/19 0217 01/26/19 0354 01/27/19 0421 01/28/19 0441  WBC 7.6  --  10.9* 9.0 7.0 10.8* 11.4*  NEUTROABS 5.7  --  9.5* 7.6  --   --   --   HGB 12.6   < > 11.7* 10.7* 12.0 11.9* 11.4*  HCT 41.6   < > 37.1 34.0* 38.8 37.7 36.1  MCV 97.7  --  92.1 91.6 93.0 90.4 89.8  PLT 82*  --  68* 64* 59* 70* 79*   < > = values in this interval not displayed.   Basic Metabolic Panel: Recent Labs  Lab 01/24/19 0316 01/25/19 0217 01/26/19 0354 01/27/19 0421 01/28/19 0441  NA 139 137 138 136 135  K 3.9 4.3 4.5 4.6 4.3  CL 109 108 107 106 108  CO2 17* 19* 19* 18* 17*  GLUCOSE 106* 113* 78 76 92  BUN 24* 33* 45* 52* 55*  CREATININE 1.38* 1.50* 1.70* 1.54* 1.64*  CALCIUM 8.6* 8.3* 8.8* 8.7* 8.4*  MG  --   --  2.0 2.1 2.2   GFR: Estimated Creatinine Clearance: 30.1 mL/min (A) (by C-G formula based on SCr of 1.64 mg/dL (H)). Liver Function Tests: Recent Labs  Lab 01/24/19 0316 01/25/19 0217 01/26/19 0354 01/27/19 0421 01/28/19 0441  AST 54* 38 35 31 32  ALT 18 15 17 15 16   ALKPHOS 53 42 52 69 65  BILITOT 1.8* 1.9* 2.5* 2.0* 2.0*  PROT 8.6* 7.7 8.7* 8.1 8.1  ALBUMIN 3.1* 2.7* 2.7* 2.4* 2.3*   No results for input(s): LIPASE, AMYLASE in the last 168 hours. No results for input(s): AMMONIA in the last 168 hours. Coagulation Profile: Recent Labs  Lab 01/24/19 1116 01/25/19 0217 01/26/19 0354 01/27/19 0421 01/28/19 0441  INR 2.8* 3.9* 3.4* 3.8* 4.7*   Cardiac Enzymes: No results for input(s):  CKTOTAL, CKMB, CKMBINDEX, TROPONINI in the last 168 hours. BNP (last 3 results) No results for input(s): PROBNP in the last 8760 hours. HbA1C: No results for input(s): HGBA1C in the last 72 hours. CBG: No results for input(s): GLUCAP in the last 168 hours. Lipid Profile: No results for input(s): CHOL, HDL, LDLCALC, TRIG, CHOLHDL, LDLDIRECT in the last 72 hours. Thyroid Function Tests: No results for input(s): TSH, T4TOTAL, FREET4, T3FREE, THYROIDAB in the last 72 hours. Anemia Panel: No results for input(s): VITAMINB12, FOLATE, FERRITIN, TIBC, IRON, RETICCTPCT in the last 72 hours. Urine analysis:    Component Value Date/Time   COLORURINE  YELLOW 01/25/2019 0153   APPEARANCEUR HAZY (A) 01/25/2019 0153   LABSPEC 1.018 01/25/2019 0153   PHURINE 5.0 01/25/2019 0153   GLUCOSEU NEGATIVE 01/25/2019 0153   GLUCOSEU NEGATIVE 05/15/2018 1525   HGBUR NEGATIVE 01/25/2019 0153   BILIRUBINUR NEGATIVE 01/25/2019 0153   BILIRUBINUR NEG 11/07/2017 1013   KETONESUR NEGATIVE 01/25/2019 0153   PROTEINUR NEGATIVE 01/25/2019 0153   UROBILINOGEN 1.0 05/15/2018 1525   NITRITE NEGATIVE 01/25/2019 0153   LEUKOCYTESUR NEGATIVE 01/25/2019 0153   Sepsis Labs: @LABRCNTIP (procalcitonin:4,lacticidven:4)  ) Recent Results (from the past 240 hour(s))  SARS Coronavirus 2 [facility] order, Performed in [facility] hospital lab) Nasopharyngeal Nasopharyngeal Swab     Status: None   Collection Time: 02/04/2019 10:12 PM   Specimen: Nasopharyngeal Swab  Result Value Ref Range Status   SARS Coronavirus 2 NEGATIVE NEGATIVE Final    Comment: (NOTE) If result is NEGATIVE SARS-CoV-2 target nucleic acids are NOT DETECTED. The SARS-CoV-2 RNA is generally detectable in upper and lower  respiratory specimens during the acute phase of infection. The lowest  concentration of SARS-CoV-2 viral copies this assay can detect is 250  copies / mL. A negative result does not preclude SARS-CoV-2 infection  and should not be used  as the sole basis for treatment or other  patient management decisions.  A negative result may occur with  improper specimen collection / handling, submission of specimen other  than nasopharyngeal swab, presence of viral mutation(s) within the  areas targeted by this assay, and inadequate number of viral copies  (<250 copies / mL). A negative result must be combined with clinical  observations, patient history, and epidemiological information. If result is POSITIVE SARS-CoV-2 target nucleic acids are DETECTED. The SARS-CoV-2 RNA is generally detectable in upper and lower  respiratory specimens dur ing the acute phase of infection.  Positive  results are indicative of active infection with SARS-CoV-2.  Clinical  correlation with patient history and other diagnostic information is  necessary to determine patient infection status.  Positive results do  not rule out bacterial infection or co-infection with other viruses. If result is PRESUMPTIVE POSTIVE SARS-CoV-2 nucleic acids MAY BE PRESENT.   A presumptive positive result was obtained on the submitted specimen  and confirmed on repeat testing.  While 2019 novel coronavirus  (SARS-CoV-2) nucleic acids may be present in the submitted sample  additional confirmatory testing may be necessary for epidemiological  and / or clinical management purposes  to differentiate between  SARS-CoV-2 and other Sarbecovirus currently known to infect humans.  If clinically indicated additional testing with an alternate test  methodology [phone]) is advised. The SARS-CoV-2 RNA is generally  detectable in upper and lower respiratory sp ecimens during the acute  phase of infection. The expected result is Negative. Fact Sheet for Patients:  StrictlyIdeas.no Fact Sheet for Healthcare Providers: BankingDealers.co.za This test is not yet approved or cleared by the [place] FDA and has been authorized for  detection and/or diagnosis of SARS-CoV-2 by FDA under an Emergency Use Authorization (EUA).  This EUA will remain in effect (meaning this test can be used) for the duration of the COVID-19 declaration under Section 564(b)(1) of the Act, 21 U.S.C. section 360bbb-3(b)(1), unless the authorization is terminated or revoked sooner. Performed at [facility], [place] [address], [place], [place] [address]   Culture, Urine     Status: None   Collection Time: 01/25/19  1:20 AM   Specimen: Urine, Catheterized  Result Value Ref Range Status   Specimen  Description URINE, CATHETERIZED  Final   Special Requests   Final    NONE Performed at [facility], [place] [address], [place], [place] [address]    Culture   Final    Multiple bacterial morphotypes present, none predominant. Suggest appropriate recollection if clinically indicated.   Report Status 01/26/2019 FINAL  Final  Culture, blood (routine x 2)     Status: None (Preliminary result)   Collection Time: 01/25/19  2:16 AM   Specimen: BLOOD RIGHT HAND  Result Value Ref Range Status   Specimen Description BLOOD RIGHT HAND  Final   Special Requests   Final    BOTTLES DRAWN AEROBIC AND ANAEROBIC Blood Culture adequate volume   Culture   Final    NO GROWTH 2 DAYS Performed at [facility], [place] [address] [place], [place] [address]    Report Status PENDING  Incomplete  Culture, blood (routine x 2)     Status: None (Preliminary result)   Collection Time: 01/25/19  2:16 AM   Specimen: BLOOD LEFT HAND  Result Value Ref Range Status   Specimen Description BLOOD LEFT HAND  Final   Special Requests   Final    BOTTLES DRAWN AEROBIC AND ANAEROBIC Blood Culture adequate volume   Culture   Final    NO GROWTH 2 DAYS Performed at [facility], [place] [address], [place], [place] [address]    Report Status PENDING  Incomplete  C difficile quick scan w PCR reflex     Status: None   Collection Time: 01/26/19  6:33 PM   Specimen: STOOL   Result Value Ref Range Status   C Diff antigen NEGATIVE NEGATIVE Final   C Diff toxin NEGATIVE NEGATIVE Final   C Diff interpretation No C. difficile detected.  Final    Comment: Performed at [facility], [place] [address] [place], [place] [address]  Culture, blood (routine x 2)     Status: None (Preliminary result)   Collection Time: 01/26/19  8:10 PM   Specimen: BLOOD  Result Value Ref Range Status   Specimen Description BLOOD BLOOD RIGHT FOREARM  Final   Special Requests   Final    BOTTLES DRAWN AEROBIC AND ANAEROBIC Blood Culture adequate volume   Culture   Final    NO GROWTH < 24 HOURS Performed at [facility], [place] [address] [place], [place] [address]    Report Status PENDING  Incomplete  Culture, blood (routine x 2)     Status: None (Preliminary result)   Collection Time: 01/26/19  8:25 PM   Specimen: BLOOD LEFT HAND  Result Value Ref Range Status   Specimen Description BLOOD LEFT HAND  Final   Special Requests   Final    BOTTLES DRAWN AEROBIC ONLY Blood Culture adequate volume   Culture   Final    NO GROWTH < 24 HOURS Performed at [facility], [place] [address] [place], [place] [address]    Report Status PENDING  Incomplete         Radiology Studies: No results found.      Scheduled Meds: . aspirin EC  81 mg Oral Daily  . atorvastatin  40 mg Oral q1800  . metoprolol succinate  25 mg Oral Daily  . Warfarin - Pharmacist Dosing Inpatient   Does not apply q1800   Continuous Infusions: . sodium chloride 50 mL/hr at 01/27/19 2022  . cefTRIAXone (ROCEPHIN)  IV 2 g (01/28/19 0930)  LOS: 4 days   The patient is critically ill with multiple organ systems failure and requires high complexity decision making for assessment and support, frequent evaluation and titration of therapies, application of advanced monitoring technologies and extensive interpretation of multiple databases. Critical Care Time devoted to patient care services described  in this note  Time spent: 40 minutes     , Geraldo Docker, MD [facility] Pager [phone]  If 7PM-7AM, please contact night-coverage [email or web address] Password [email or web address] 01/28/2019, 10:28 AM

## 2019-01-28 NOTE — Progress Notes (Signed)
Progress Note  Patient Name: Marie Malone Date of Encounter: 01/28/2019  Primary Cardiologist: [name], MD  Subjective   Feels weak.  Complains of leg discomfort but feels like swelling has gotten somewhat better.  No chest pain or breathlessness at rest.  Inpatient Medications    Scheduled Meds: . aspirin EC  81 mg Oral Daily  . atorvastatin  40 mg Oral q1800  . metoprolol succinate  25 mg Oral Daily  . Warfarin - Pharmacist Dosing Inpatient   Does not apply q1800   Continuous Infusions: . sodium chloride 50 mL/hr at 01/27/19 2022  . cefTRIAXone (ROCEPHIN)  IV 2 g (01/28/19 0930)   PRN Meds: acetaminophen **OR** acetaminophen, ondansetron **OR** ondansetron (ZOFRAN) IV   Vital Signs    Vitals:   01/27/19 1446 01/27/19 2146 01/28/19 0412 01/28/19 0927  BP: 110/65 (!) 105/54 (!) 88/53 116/70  Pulse: 89 (!) 106 85 88  Resp: (!) 21 (!) 29 19   Temp: 98.6 F (37 C)  98.5 F (36.9 C)   TempSrc: Oral  Oral   SpO2: 100% 96% 98%   Weight:   68.2 kg   Height:        Intake/Output Summary (Last 24 hours) at 01/28/2019 1244 Last data filed at 01/28/2019 0600 Gross per 24 hour  Intake 662 ml  Output 450 ml  Net 212 ml   Filed Weights   01/25/19 0412 01/27/19 0500 01/28/19 0412  Weight: 62.7 kg 62.7 kg 68.2 kg    Telemetry    Possible atrial tachycardia or atypical atrial flutter with 2:1 block.  Personally reviewed.  ECG    An ECG dated 01/24/2019 was personally reviewed today and demonstrated:  Probable atrial tachycardia versus atypical atrial flutter with 2:1 block and diffuse repolarization abnormalities.  Physical Exam   GEN: No acute distress.   Neck: No JVD. Cardiac: RRR, 2/6 systolic murmur, no gallop.  Respiratory: Nonlabored. Clear to auscultation bilaterally. GI: Soft, nontender, bowel sounds present. MS:  Mild ankle edema; No deformity. Neuro:  Nonfocal. Psych: Alert and oriented x 3. Normal affect.  Labs    Chemistry Recent Labs   Lab 01/26/19 0354 01/27/19 0421 01/28/19 0441  NA 138 136 135  K 4.5 4.6 4.3  CL 107 106 108  CO2 19* 18* 17*  GLUCOSE 78 76 92  BUN 45* 52* 55*  CREATININE 1.70* 1.54* 1.64*  CALCIUM 8.8* 8.7* 8.4*  PROT 8.7* 8.1 8.1  ALBUMIN 2.7* 2.4* 2.3*  AST 35 31 32  ALT 17 15 16   ALKPHOS 52 69 65  BILITOT 2.5* 2.0* 2.0*  GFRNONAA 31* 35* 32*  GFRAA 36* 40* 37*  ANIONGAP 12 12 10      Hematology Recent Labs  Lab 01/26/19 0354 01/27/19 0421 01/28/19 0441  WBC 7.0 10.8* 11.4*  RBC 4.17 4.17 4.02  HGB 12.0 11.9* 11.4*  HCT 38.8 37.7 36.1  MCV 93.0 90.4 89.8  MCH 28.8 28.5 28.4  MCHC 30.9 31.6 31.6  RDW 16.3* 16.2* 16.4*  PLT 59* 70* 79*    Cardiac Enzymes Recent Labs  Lab 02/06/2019 2211 01/24/19 0259 01/24/19 0738 01/24/19 0807  TROPONINIHS 20* 132* 111* 113*    BNP Recent Labs  Lab 01/24/2019 2212  BNP 1,699.1*     Radiology    No results found.  Cardiac Studies   Echocardiogram 01/24/2019:  1. Left ventricular ejection fraction, by visual estimation, is 45 to 50%. The left ventricle has mildly decreased function. Normal left ventricular size. Left  ventricular septal wall thickness was normal. Normal left ventricular posterior wall  thickness. There is no left ventricular hypertrophy.  2. Indeterminate diastolic function due to mechanical mitral valve pattern of LV diastolic filling.  3. Right ventricular pressure overload and right ventricular volume overload.  4. Global right ventricle has moderately reduced systolic function.The right ventricular size is moderately enlarged. No increase in right ventricular wall thickness.  5. Left atrial size was moderately dilated.  6. Right atrial size was severely dilated.  7. The mitral valve has been repaired/replaced. No evidence of mitral valve regurgitation. No evidence of mitral stenosis.  8. The tricuspid valve is normal in structure. Tricuspid valve regurgitation mild-moderate.  9. The aortic valve The aortic  valve is normal in structure. Aortic valve regurgitation was not visualized by color flow Doppler. Structurally normal aortic valve, with no evidence of sclerosis or stenosis. 10. The pulmonic valve was normal in structure. Pulmonic valve regurgitation is mild by color flow Doppler. 11. Moderately elevated pulmonary artery systolic pressure. 12. The inferior vena cava is dilated in size with <50% respiratory variability, suggesting right atrial pressure of 15 mmHg.  Patient Profile     68 y.o. female with a history of rheumatic fever/mitral stenosiss/pvalvuloplasty at [facility] in 2008, CADs/pCABG(SVG to PDA and maze procedure, mechanical MVR 05/2016, paroxysmal atrial fibrillation on Coumadin found on ILR, PADs/pendarterectomy 11/2017who was admitted 09/16 foracute on chronic CHF.  Assessment & Plan    1.  Acute on chronic combined heart failure.  Intake and output have been fairly even and she remains off Lasix and ARB with recent hypotension and renal insufficiency.  Blood pressures are stabilizing.  2.  Paroxysmal atrial fibrillation with history of mechanical MVR at the time of CABG and Maze procedure in 2018.  She is on Coumadin per pharmacy with recent INR 4.7.  3.  Right ventricular dysfunction, possibly secondary to pulmonary hypertension in association with mitral valve disease and also potentially OSA.  4.  Transient hypotension, blood pressure stabilizing.  She has been off ARB and Lasix.  5.  CAD status post CABG.  No active angina symptoms.  She is on statin therapy.  Continue aspirin, Coumadin, Toprol-XL, and Lipitor.  Repeat ECG - it looks to me like she has been in an atrial tachycardia or atypical atrial flutter with initially variable conduction and then 2:1 block instead of converting to sinus rhythm.  This still may be the precipitant for her heart failure exacerbation and a cardioversion could still be considered.  Continue to hold Lasix and ARB for now.  Repeat BMET in  a.m.  Signed, Rozann Lesches, MD  01/28/2019, 12:44 PM

## 2019-01-29 ENCOUNTER — Inpatient Hospital Stay (HOSPITAL_COMMUNITY): Payer: Medicare Other

## 2019-01-29 DIAGNOSIS — I482 Chronic atrial fibrillation, unspecified: Secondary | ICD-10-CM

## 2019-01-29 DIAGNOSIS — Z952 Presence of prosthetic heart valve: Secondary | ICD-10-CM

## 2019-01-29 DIAGNOSIS — N179 Acute kidney failure, unspecified: Secondary | ICD-10-CM

## 2019-01-29 DIAGNOSIS — I509 Heart failure, unspecified: Secondary | ICD-10-CM

## 2019-01-29 DIAGNOSIS — R079 Chest pain, unspecified: Secondary | ICD-10-CM | POA: Diagnosis present

## 2019-01-29 LAB — BASIC METABOLIC PANEL
Anion gap: 11 (ref 5–15)
BUN: 59 mg/dL — ABNORMAL HIGH (ref 8–23)
CO2: 18 mmol/L — ABNORMAL LOW (ref 22–32)
Calcium: 8.3 mg/dL — ABNORMAL LOW (ref 8.9–10.3)
Chloride: 107 mmol/L (ref 98–111)
Creatinine, Ser: 1.63 mg/dL — ABNORMAL HIGH (ref 0.44–1.00)
GFR calc Af Amer: 37 mL/min — ABNORMAL LOW (ref >60)
GFR calc non Af Amer: 32 mL/min — ABNORMAL LOW (ref >60)
Glucose, Bld: 121 mg/dL — ABNORMAL HIGH (ref 70–99)
Potassium: 4.1 mmol/L (ref 3.5–5.1)
Sodium: 136 mmol/L (ref 135–145)

## 2019-01-29 LAB — COMPREHENSIVE METABOLIC PANEL
ALT: 14 U/L (ref 0–44)
AST: 38 U/L (ref 15–41)
Albumin: 2.2 g/dL — ABNORMAL LOW (ref 3.5–5.0)
Alkaline Phosphatase: 66 U/L (ref 38–126)
Anion gap: 11 (ref 5–15)
BUN: 60 mg/dL — ABNORMAL HIGH (ref 8–23)
CO2: 19 mmol/L — ABNORMAL LOW (ref 22–32)
Calcium: 8.5 mg/dL — ABNORMAL LOW (ref 8.9–10.3)
Chloride: 106 mmol/L (ref 98–111)
Creatinine, Ser: 1.78 mg/dL — ABNORMAL HIGH (ref 0.44–1.00)
GFR calc Af Amer: 34 mL/min — ABNORMAL LOW (ref >60)
GFR calc non Af Amer: 29 mL/min — ABNORMAL LOW (ref >60)
Glucose, Bld: 105 mg/dL — ABNORMAL HIGH (ref 70–99)
Potassium: 4.1 mmol/L (ref 3.5–5.1)
Sodium: 136 mmol/L (ref 135–145)
Total Bilirubin: 1.6 mg/dL — ABNORMAL HIGH (ref 0.3–1.2)
Total Protein: 7.8 g/dL (ref 6.5–8.1)

## 2019-01-29 LAB — URINE CULTURE: Culture: <10000 — AB

## 2019-01-29 LAB — CBC
HCT: 34.7 % — ABNORMAL LOW (ref 36.0–46.0)
Hemoglobin: 11.2 g/dL — ABNORMAL LOW (ref 12.0–15.0)
MCH: 28.6 pg (ref 26.0–34.0)
MCHC: 32.3 g/dL (ref 30.0–36.0)
MCV: 88.5 fL (ref 80.0–100.0)
Platelets: 96 10*3/uL — ABNORMAL LOW (ref 150–400)
RBC: 3.92 MIL/uL (ref 3.87–5.11)
RDW: 16.3 % — ABNORMAL HIGH (ref 11.5–15.5)
WBC: 13.2 10*3/uL — ABNORMAL HIGH (ref 4.0–10.5)
nRBC: 0.2 % (ref 0.0–0.2)

## 2019-01-29 LAB — MAGNESIUM
Magnesium: 2.2 mg/dL (ref 1.7–2.4)
Magnesium: 2.4 mg/dL (ref 1.7–2.4)

## 2019-01-29 LAB — PROTIME-INR
INR: 6.6 (ref 0.8–1.2)
Prothrombin Time: 56.6 seconds — ABNORMAL HIGH (ref 11.4–15.2)

## 2019-01-29 LAB — TROPONIN I (HIGH SENSITIVITY)
Troponin I (High Sensitivity): 791 ng/L (ref <18)
Troponin I (High Sensitivity): 718 ng/L (ref <18)

## 2019-01-29 MED ORDER — SODIUM CHLORIDE 0.9 % IV BOLUS
500.0000 mL | Freq: Once | INTRAVENOUS | Status: AC
  Administered 2019-01-29: 18:00:00 500 mL via INTRAVENOUS

## 2019-01-29 MED ORDER — ALBUMIN HUMAN 25 % IV SOLN
50.0000 g | Freq: Once | INTRAVENOUS | Status: AC
  Administered 2019-01-29: 50 g via INTRAVENOUS
  Filled 2019-01-29: qty 50

## 2019-01-29 NOTE — Progress Notes (Signed)
Progress Note  Patient Name: Marie Malone Date of Encounter: 01/29/2019  Primary Cardiologist: [name], MD   Subjective   Pt attempting to eat, overall poor PO intake. Continues to be hypotensive.   Inpatient Medications    Scheduled Meds: . aspirin EC  81 mg Oral Daily  . atorvastatin  40 mg Oral q1800  . metoprolol succinate  12.5 mg Oral Daily   Continuous Infusions: . sodium chloride 10 mL/hr at 01/29/19 0818  . cefTRIAXone (ROCEPHIN)  IV 2 g (01/29/19 0943)   PRN Meds: acetaminophen **OR** acetaminophen, ondansetron **OR** ondansetron (ZOFRAN) IV   Vital Signs    Vitals:   01/29/19 0314 01/29/19 0508 01/29/19 0526 01/29/19 0627  BP: (!) 92/50 (!) 89/49 (!) 92/51 (!) 98/55  Pulse: 82 83    Resp: 17 19 19 19   Temp:  97.8 F (36.6 C)    TempSrc:  Oral    SpO2: 100% 100%    Weight:  64.6 kg    Height:        Intake/Output Summary (Last 24 hours) at 01/29/2019 0946 Last data filed at 01/29/2019 0700 Gross per 24 hour  Intake 1567.89 ml  Output -  Net 1567.89 ml   Last 3 Weights 01/29/2019 01/28/2019 01/27/2019  Weight (lbs) 142 lb 8 oz 150 lb 5.7 oz 138 lb 3.7 oz  Weight (kg) 64.638 kg 68.2 kg 62.7 kg      Telemetry    Appears to be 2:1 atrial flutter, ventricular rate in the 80s - Personally Reviewed  ECG    Appears to be 2:1 atrial flutter - Personally Reviewed  Physical Exam   GEN: elderly female No acute distress.   Neck: No JVD Cardiac: RRR, valve click Respiratory: Clear to auscultation bilaterally. GI: Soft, nontender, non-distended  MS: Left LE edema. Neuro:  Nonfocal  Psych: Normal affect   Labs    High Sensitivity Troponin:   Recent Labs  Lab 01/09/2019 2211 01/24/19 0259 01/24/19 0738 01/24/19 0807  TROPONINIHS 20* 132* 111* 113*      Chemistry Recent Labs  Lab 01/27/19 0421 01/28/19 0441 01/28/19 1551 01/29/19 0527  NA 136 135 136 136  K 4.6 4.3 4.5 4.1  CL 106 108 108 106  CO2 18* 17* 17* 19*  GLUCOSE 76  92 103* 105*  BUN 52* 55* 56* 60*  CREATININE 1.54* 1.64* 1.51* 1.78*  CALCIUM 8.7* 8.4* 8.3* 8.5*  PROT 8.1 8.1  --  7.8  ALBUMIN 2.4* 2.3*  --  2.2*  AST 31 32  --  38  ALT 15 16  --  14  ALKPHOS 69 65  --  66  BILITOT 2.0* 2.0*  --  1.6*  GFRNONAA 35* 32* 35* 29*  GFRAA 40* 37* 41* 34*  ANIONGAP 12 10 11 11      Hematology Recent Labs  Lab 01/27/19 0421 01/28/19 0441 01/29/19 0527  WBC 10.8* 11.4* 13.2*  RBC 4.17 4.02 3.92  HGB 11.9* 11.4* 11.2*  HCT 37.7 36.1 34.7*  MCV 90.4 89.8 88.5  MCH 28.5 28.4 28.6  MCHC 31.6 31.6 32.3  RDW 16.2* 16.4* 16.3*  PLT 70* 79* 96*    BNP Recent Labs  Lab 01/14/2019 2212  BNP 1,699.1*     DDimer No results for input(s): DDIMER in the last 168 hours.   Radiology    Dg Chest Port 1 View  Result Date: 01/29/2019 CLINICAL DATA:  Shortness of breath. History of stroke, atrial fibrillation, hypertension. EXAM: PORTABLE CHEST  1 VIEW COMPARISON:  Chest x-rays dated 01/25/2019 and 01/22/2019. FINDINGS: Stable cardiomegaly. Probable mild atelectasis and/or small pleural effusion at the LEFT lung base. Lungs are otherwise clear. No pneumothorax seen. IMPRESSION: Probable mild atelectasis and/or small pleural effusion at the LEFT lung base. Lungs otherwise clear. Electronically Signed   By: Franki Cabot M.D.   On: 01/29/2019 08:02    Cardiac Studies   Echocardiogram 01/24/2019: 1. Left ventricular ejection fraction, by visual estimation, is 45 to 50%. The left ventricle has mildly decreased function. Normal left ventricular size. Left ventricular septal wall thickness was normal. Normal left ventricular posterior wall  thickness. There is no left ventricular hypertrophy. 2. Indeterminate diastolic function due to mechanical mitral valve pattern of LV diastolic filling. 3. Right ventricular pressure overload and right ventricular volume overload. 4. Global right ventricle has moderately reduced systolic function.The right ventricular  size is moderately enlarged. No increase in right ventricular wall thickness. 5. Left atrial size was moderately dilated. 6. Right atrial size was severely dilated. 7. The mitral valve has been repaired/replaced. No evidence of mitral valve regurgitation. No evidence of mitral stenosis. 8. The tricuspid valve is normal in structure. Tricuspid valve regurgitation mild-moderate. 9. The aortic valve The aortic valve is normal in structure. Aortic valve regurgitation was not visualized by color flow Doppler. Structurally normal aortic valve, with no evidence of sclerosis or stenosis. 10. The pulmonic valve was normal in structure. Pulmonic valve regurgitation is mild by color flow Doppler. 11. Moderately elevated pulmonary artery systolic pressure. 12. The inferior vena cava is dilated in size with <50% respiratory variability, suggesting right atrial pressure of 15 mmHg.  Patient Profile     68 y.o. 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  Assessment & Plan    1. 2:1 atrial flutter - repeat EKG this morning appears 2:1 atrial flutter - s/p MAZE procedure at the time of CABG and mechanical MVR (2018)  - HR in the 80s - continue 12.5 mg toprol   2. Acute on chronic systolic and diastolic heart failure 3. Right ventricular dysfunction  4. Long-standing mitral valve disease 5. Suspected OSA - noted desaturations overnight earlier this admission - lasix and ARB have been held due to hypotension - she is overall net positive 2.2 L - urine output has not been charted - weight is 142 lbs, was 138 lbs on admission - pressures remain marginal with systolic BP in the 93-79K - poor PO intake - left leg swelling   . Mechanical MVR -  coumadin on hold for an INR of 6.6 - per pharmacy        For questions or updates, please contact [place] Please consult [email or web address] for contact info under        Signed, Ledora Bottcher, PA  01/29/2019, 9:46 AM

## 2019-01-29 NOTE — Plan of Care (Signed)
  Problem: Clinical Measurements: Goal: Will remain free from infection Outcome: Progressing Note:  No s/s of infection noted. Goal: Respiratory complications will improve Outcome: Progressing Note:  No s/s of respiratory complications noted.   

## 2019-01-29 NOTE — Progress Notes (Signed)
PROGRESS NOTE    Marie Malone  JJO:[id number] DOB: February 23, 1951 DOA: 01/22/2019 PCP: Debbrah Alar, NP   Brief Narrative:  Marie Malone is a 68 y.o. 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  Patient states over the last 2 weeks patient has been getting progressively short of breath on exertion.  Denies any chest pain fever chills or productive cough.  Patient's primary care physician advised her to increase her Lasix twice daily which she is supposed to start today.  Because of worsening shortness of breath patient came to the ER.  EMS was called and patient initially was placed on BiPAP.  Patient also had complained of some left calf cramping.  ED Course: In the ER patient was hypoxic placed on BiPAP chest x-ray shows features consistent with CHF.  Lab work show a BNP of 1699, sodium 139 potassium 4.2 creatinine 1.1 WBC count of 7.6 hemoglobin 12.6 platelets 82 and EKG was showing A. fib rate at 106 bpm.  Patient was given 60 mg IV Lasix following which patient was able to be weaned off BiPAP and presently is on 5 L oxygen.  Patient admitted for further management of acute CHF.  COVID-19 test was negative.  On exam patient has good peripheral pulses with some edema in the lower extremity.   Subjective: 9/21 last 24 hours afebrile positive substernal CP with radiation to left shoulder.  States pain started sometime earlier today.  Negative abdominal pain, negative N/V.  Also states increased LLE pain from 9/20.   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   S/P CABG x 1   Acute on chronic  systolic CHF (congestive heart failure) (HCC)   Atrial fibrillation, chronic   Cellulitis of left leg   Acute renal failure (HCC)   Primary biliary cirrhosis (HCC)   Chest pain at rest   H/O mitral valve replacement with mechanical valve  Acute respiratory failure with hypoxia -Not on home O2 -Titrate O2 to maintain SPO2 89 to 93% -PCXR 9/21 pending  Chest pain acute started 9/21 - Patient borderline hypotensive with MAP 60-63 - Obtain 12-lead EKG; atrial flutter, nonspecific ST-T wave changes no changes from previous EKG - Troponin high-sensitivity 791/ - Albumin 50 g - Normal saline bolus 541ml - BMP, Mg WNL - BP improved, after albumin and normal saline bolus.  Acute on chronic systolic CHF -Strict in and out +3.1 L -Daily weight Filed Weights   01/27/19 0500 01/28/19 0412 01/29/19 0508  Weight: 62.7 kg 68.2 kg 64.6 kg  - EF 40 to 45% see echocardiogram results below - Per cardiology discontinue Lasix, ARB, and K-Dur -9/20 decreased metoprolol XR 12.5 mg daily (patient's BP on the hypotensive side)  Mechanical mitral valve - Continue warfarin, monitor INR Recent Labs  Lab 01/25/19 0217 01/26/19 0354 01/27/19 0421 01/28/19 0441 01/29/19 0527  INR 3.9* 3.4* 3.8* 4.7* 6.6*  -Therapeutic INR goal 2.5-3.5 -9/20 supratherapeutic INR most likely secondary to addition of antibiotic, hold Coumadin  Atrial fibrillation chronic - Currently NSR - Anticoagulated for her mechanical mitral valve  L LE cellulitis vs SIRS - Blood culture, urine culture NGTD  -  Patient also with loose stools (possibly from Ursodiol) however patient has been chronically on this medication. -C. difficile PCR negative - Once all stool and cultures drawn.  Start empiric antibiotic initially for cellulitis.  - See CHF and PBS  - 9/19 start empiric antibiotic treatment - 9/21 L LE cellulitis slightly worse today, may be secondary to hypoperfusion with low BP.  Acute renal failure (baseline Cr  1.0) Recent Labs  Lab 01/27/19 0421 01/28/19 0441 01/28/19 1551 01/29/19 0527 01/29/19 1635  CREATININE 1.54* 1.64* 1.51* 1.78* 1.63*  -Most likely secondary to overdiuresis and diarrhea. - 9/20 obtain urine lites; calculate FeNa -9/21 KVO normal saline  Primary biliary cirrhosis -Ursodial 300 mg BID (hold)  Goals of care - On 9/21 place PT/OT request patient was able to ambulate independently at home prior to this hospitalization.  Due to the severity of the LLE cellulitis unsure patient will be able to discharge home.    DVT prophylaxis: Warfarin Code Status: Full Family Communication: 9/20 daughter at bedside discussed plan of care answered all questions Disposition Plan: TBD   Consultants:  Cardiology    Procedures/Significant Events:  9/16 echocardiogram:Left ventricular ejection fraction, EF = 45 to 50%.  - Indeterminate diastolic function due to mechanical mitral valve pattern of LV diastolic filling. -Right ventricular pressure overload and right ventricular volume overload. -Global right ventricle has moderately reduced systolic function.The right ventricular size is moderately enlarged.  -Left atrial  moderately dilated. -Right atrial severely dilated. -The mitral valve has been repaired/replaced. No evidence of mitral valve regurgitation. No evidence of mitral stenosis. -The tricuspid valve is normal in structure. Tricuspid valve regurgitation mild-moderate. - Moderately elevated pulmonary artery systolic pressure. -The inferior vena cava is dilated in size with <50% respiratory variability, suggesting right atrial pressure of 15 mmHg. 9/17 bilateral lower extremity Doppler;Right: There is no evidence of deep vein thrombosis in the lower extremity. However, portions of this examination were limited- see technologist comments above. Left: There is no evidence of deep vein thrombosis in the lower extremity. However, portions of this examination were limited-  see technologist comments above. Significant fluid noted in the posterior calf, etiology unknown   I have personally reviewed and interpreted all radiology studies and my findings are as above.  VENTILATOR SETTINGS:    Cultures 9/17 blood RIGHT hand NGTD 9/17 blood LEFT hand NGTD 9/17 urine positive multiple bacterial morphotypes present, none predominant 9/18 blood RIGHT forearm NGTD 9/18 blood LEFT hand NGTD 9/18 C. difficile C. difficile antigen negative, C. difficile toxin negative    Antimicrobials: Anti-infectives (From admission, onward)   Start     Stop   01/27/19 0930  cefTRIAXone (ROCEPHIN) 2 g in sodium chloride 0.9 % 100 mL IVPB             Devices    LINES / TUBES:      Continuous Infusions: . sodium chloride 10 mL/hr at 01/29/19 1524  . cefTRIAXone (ROCEPHIN)  IV 2 g (01/29/19 0943)     Objective: Vitals:   01/29/19 0627 01/29/19 1153 01/29/19 1259 01/29/19 1311  BP: (!) 98/55 120/66 112/64 110/63  Pulse:  91 86   Resp: 19  (!) 27   Temp:   98.4 F (36.9 C) 98.4 F (36.9 C)  TempSrc:   Oral Oral  SpO2:      Weight:      Height:        Intake/Output Summary (Last 24 hours) at 01/29/2019 1918 Last data filed at 01/29/2019 1752 Gross  per 24 hour  Intake 1672.57 ml  Output -  Net 1672.57 ml   Filed Weights   01/27/19 0500 01/28/19 0412 01/29/19 0508  Weight: 62.7 kg 68.2 kg 64.6 kg    Physical Exam:  General: A/O x4, positive acute respiratory distress Eyes: negative scleral hemorrhage, negative anisocoria, negative icterus ENT: Negative Runny nose, negative gingival bleeding, Neck:  Negative scars, masses, torticollis, lymphadenopathy, JVD Lungs: Clear to auscultation bilaterally without wheezes or crackles Cardiovascular: Regular rate and rhythm without murmur gallop or rub normal S1 and S2, nonreproducible chest pain Abdomen: negative abdominal pain, nondistended, positive soft, bowel sounds, no rebound, no ascites, no  appreciable mass Extremities: LLE calf extending cephalad to mid thigh increased pain, warmth from 2/20 Skin:  Psychiatric:  Negative depression, negative anxiety, negative fatigue, negative mania  Central nervous system:  Cranial nerves II through XII intact, tongue/uvula midline, all extremities muscle strength 5/5, sensation intact throughout, negative dysarthria, negative expressive aphasia, negative receptive aphasia.     Data Reviewed: Care during the described time interval was provided by me .  I have reviewed this patient's available data, including medical history, events of note, physical examination, and all test results as part of my evaluation.   CBC: Recent Labs  Lab 01/16/2019 2211  01/24/19 0316 01/25/19 0217 01/26/19 0354 01/27/19 0421 01/28/19 0441 01/29/19 0527  WBC 7.6  --  10.9* 9.0 7.0 10.8* 11.4* 13.2*  NEUTROABS 5.7  --  9.5* 7.6  --   --   --   --   HGB 12.6   < > 11.7* 10.7* 12.0 11.9* 11.4* 11.2*  HCT 41.6   < > 37.1 34.0* 38.8 37.7 36.1 34.7*  MCV 97.7  --  92.1 91.6 93.0 90.4 89.8 88.5  PLT 82*  --  68* 64* 59* 70* 79* 96*   < > = values in this interval not displayed.   Basic Metabolic Panel: Recent Labs  Lab 01/26/19 0354 01/27/19 0421 01/28/19 0441 01/28/19 1551 01/29/19 0527 01/29/19 1635  NA 138 136 135 136 136 136  K 4.5 4.6 4.3 4.5 4.1 4.1  CL 107 106 108 108 106 107  CO2 19* 18* 17* 17* 19* 18*  GLUCOSE 78 76 92 103* 105* 121*  BUN 45* 52* 55* 56* 60* 59*  CREATININE 1.70* 1.54* 1.64* 1.51* 1.78* 1.63*  CALCIUM 8.8* 8.7* 8.4* 8.3* 8.5* 8.3*  MG 2.0 2.1 2.2  --  2.2 2.4   GFR: Estimated Creatinine Clearance: 29.6 mL/min (A) (by C-G formula based on SCr of 1.63 mg/dL (H)). Liver Function Tests: Recent Labs  Lab 01/25/19 0217 01/26/19 0354 01/27/19 0421 01/28/19 0441 01/29/19 0527  Malone 38 35 31 32 38  ALT 15 17 15 16 14   ALKPHOS 42 52 69 65 66  BILITOT 1.9* 2.5* 2.0* 2.0* 1.6*  PROT 7.7 8.7* 8.1 8.1 7.8  ALBUMIN 2.7* 2.7*  2.4* 2.3* 2.2*   No results for input(s): LIPASE, AMYLASE in the last 168 hours. No results for input(s): AMMONIA in the last 168 hours. Coagulation Profile: Recent Labs  Lab 01/25/19 0217 01/26/19 0354 01/27/19 0421 01/28/19 0441 01/29/19 0527  INR 3.9* 3.4* 3.8* 4.7* 6.6*   Cardiac Enzymes: No results for input(s): CKTOTAL, CKMB, CKMBINDEX, TROPONINI in the last 168 hours. BNP (last 3 results) No results for input(s): PROBNP in the last 8760 hours. HbA1C: No results for input(s): HGBA1C in the last 72 hours. CBG: No results for input(s): GLUCAP in the last 168 hours. Lipid Profile: No  results for input(s): CHOL, HDL, LDLCALC, TRIG, CHOLHDL, LDLDIRECT in the last 72 hours. Thyroid Function Tests: No results for input(s): TSH, T4TOTAL, FREET4, T3FREE, THYROIDAB in the last 72 hours. Anemia Panel: No results for input(s): VITAMINB12, FOLATE, FERRITIN, TIBC, IRON, RETICCTPCT in the last 72 hours. Urine analysis:    Component Value Date/Time   COLORURINE YELLOW 01/25/2019 0153   APPEARANCEUR HAZY (A) 01/25/2019 0153   LABSPEC 1.018 01/25/2019 0153   PHURINE 5.0 01/25/2019 0153   GLUCOSEU NEGATIVE 01/25/2019 0153   GLUCOSEU NEGATIVE 05/15/2018 1525   HGBUR NEGATIVE 01/25/2019 0153   BILIRUBINUR NEGATIVE 01/25/2019 0153   BILIRUBINUR NEG 11/07/2017 1013   KETONESUR NEGATIVE 01/25/2019 0153   PROTEINUR NEGATIVE 01/25/2019 0153   UROBILINOGEN 1.0 05/15/2018 1525   NITRITE NEGATIVE 01/25/2019 0153   LEUKOCYTESUR NEGATIVE 01/25/2019 0153   Sepsis Labs: @LABRCNTIP (procalcitonin:4,lacticidven:4)  ) Recent Results (from the past 240 hour(s))  SARS Coronavirus 2 [facility] order, Performed in [facility] hospital lab) Nasopharyngeal Nasopharyngeal Swab     Status: None   Collection Time: 02/01/2019 10:12 PM   Specimen: Nasopharyngeal Swab  Result Value Ref Range Status   SARS Coronavirus 2 NEGATIVE NEGATIVE Final    Comment: (NOTE) If result is NEGATIVE SARS-CoV-2 target  nucleic acids are NOT DETECTED. The SARS-CoV-2 RNA is generally detectable in upper and lower  respiratory specimens during the acute phase of infection. The lowest  concentration of SARS-CoV-2 viral copies this assay can detect is 250  copies / mL. A negative result does not preclude SARS-CoV-2 infection  and should not be used as the sole basis for treatment or other  patient management decisions.  A negative result may occur with  improper specimen collection / handling, submission of specimen other  than nasopharyngeal swab, presence of viral mutation(s) within the  areas targeted by this assay, and inadequate number of viral copies  (<250 copies / mL). A negative result must be combined with clinical  observations, patient history, and epidemiological information. If result is POSITIVE SARS-CoV-2 target nucleic acids are DETECTED. The SARS-CoV-2 RNA is generally detectable in upper and lower  respiratory specimens dur ing the acute phase of infection.  Positive  results are indicative of active infection with SARS-CoV-2.  Clinical  correlation with patient history and other diagnostic information is  necessary to determine patient infection status.  Positive results do  not rule out bacterial infection or co-infection with other viruses. If result is PRESUMPTIVE POSTIVE SARS-CoV-2 nucleic acids MAY BE PRESENT.   A presumptive positive result was obtained on the submitted specimen  and confirmed on repeat testing.  While 2019 novel coronavirus  (SARS-CoV-2) nucleic acids may be present in the submitted sample  additional confirmatory testing may be necessary for epidemiological  and / or clinical management purposes  to differentiate between  SARS-CoV-2 and other Sarbecovirus currently known to infect humans.  If clinically indicated additional testing with an alternate test  methodology [phone]) is advised. The SARS-CoV-2 RNA is generally  detectable in upper and lower  respiratory sp ecimens during the acute  phase of infection. The expected result is Negative. Fact Sheet for Patients:  StrictlyIdeas.no Fact Sheet for Healthcare Providers: BankingDealers.co.za This test is not yet approved or cleared by the [place] FDA and has been authorized for detection and/or diagnosis of SARS-CoV-2 by FDA under an Emergency Use Authorization (EUA).  This EUA will remain in effect (meaning this test can be used) for the duration of the COVID-19 declaration under Section 564(b)(1) of  the Act, 21 U.S.C. section 360bbb-3(b)(1), unless the authorization is terminated or revoked sooner. Performed at [facility], [place] [address] [place], [place] [address]   Culture, Urine     Status: None   Collection Time: 01/25/19  1:20 AM   Specimen: Urine, Catheterized  Result Value Ref Range Status   Specimen Description URINE, CATHETERIZED  Final   Special Requests   Final    NONE Performed at [facility], [place] [address] [place], [place] [address]    Culture   Final    Multiple bacterial morphotypes present, none predominant. Suggest appropriate recollection if clinically indicated.   Report Status 01/26/2019 FINAL  Final  Culture, blood (routine x 2)     Status: None (Preliminary result)   Collection Time: 01/25/19  2:16 AM   Specimen: BLOOD RIGHT HAND  Result Value Ref Range Status   Specimen Description BLOOD RIGHT HAND  Final   Special Requests   Final    BOTTLES DRAWN AEROBIC AND ANAEROBIC Blood Culture adequate volume   Culture   Final    NO GROWTH 4 DAYS Performed at [facility], [place] [address] [place], [place] [address]    Report Status PENDING  Incomplete  Culture, blood (routine x 2)     Status: None (Preliminary result)   Collection Time: 01/25/19  2:16 AM   Specimen: BLOOD LEFT HAND  Result Value Ref Range Status   Specimen Description BLOOD LEFT HAND  Final   Special Requests    Final    BOTTLES DRAWN AEROBIC AND ANAEROBIC Blood Culture adequate volume   Culture   Final    NO GROWTH 4 DAYS Performed at [facility], [place] [address], [place], [place] [address]    Report Status PENDING  Incomplete  C difficile quick scan w PCR reflex     Status: None   Collection Time: 01/26/19  6:33 PM   Specimen: STOOL  Result Value Ref Range Status   C Diff antigen NEGATIVE NEGATIVE Final   C Diff toxin NEGATIVE NEGATIVE Final   C Diff interpretation No C. difficile detected.  Final    Comment: Performed at [facility], [place] [address], [place], [place] [address]  Culture, blood (routine x 2)     Status: None (Preliminary result)   Collection Time: 01/26/19  8:10 PM   Specimen: BLOOD  Result Value Ref Range Status   Specimen Description BLOOD BLOOD RIGHT FOREARM  Final   Special Requests   Final    BOTTLES DRAWN AEROBIC AND ANAEROBIC Blood Culture adequate volume   Culture   Final    NO GROWTH 3 DAYS Performed at [facility], [place] [address] [place], [place] [address]    Report Status PENDING  Incomplete  Culture, blood (routine x 2)     Status: None (Preliminary result)   Collection Time: 01/26/19  8:25 PM   Specimen: BLOOD LEFT HAND  Result Value Ref Range Status   Specimen Description BLOOD LEFT HAND  Final   Special Requests   Final    BOTTLES DRAWN AEROBIC ONLY Blood Culture adequate volume   Culture   Final    NO GROWTH 3 DAYS Performed at [facility], [place] [address] [place], [place] [address]    Report Status PENDING  Incomplete  Culture, Urine     Status: Abnormal   Collection Time: 01/28/19  6:40 PM   Specimen: Urine, Random  Result Value Ref Range Status   Specimen Description URINE, RANDOM  Final   Special Requests NONE  Final   Culture (A)  Final    <10,000 COLONIES/mL INSIGNIFICANT GROWTH Performed at [facility], [place] [address], [place], [place] [address]    Report Status 01/29/2019 FINAL  Final          Radiology Studies: Dg Chest Port 1 View  Result Date: 01/29/2019 CLINICAL DATA:  Shortness of breath. History of stroke, atrial fibrillation, hypertension. EXAM: PORTABLE CHEST 1 VIEW COMPARISON:  Chest x-rays dated 01/25/2019 and 01/25/2019. FINDINGS: Stable cardiomegaly. Probable mild atelectasis and/or small pleural effusion at the LEFT lung base. Lungs are otherwise clear. No pneumothorax seen. IMPRESSION: Probable mild atelectasis and/or small pleural effusion at the LEFT lung base. Lungs otherwise clear. Electronically Signed   By: Franki Cabot M.D.   On: 01/29/2019 08:02        Scheduled Meds: . aspirin EC  81 mg Oral Daily  . atorvastatin  40 mg Oral q1800  . metoprolol succinate  12.5 mg Oral Daily   Continuous Infusions: . sodium chloride 10 mL/hr at 01/29/19 1524  . cefTRIAXone (ROCEPHIN)  IV 2 g (01/29/19 0943)     LOS: 5 days   The patient is critically ill with multiple organ systems failure and requires high complexity decision making for assessment and support, frequent evaluation and titration of therapies, application of advanced monitoring technologies and extensive interpretation of multiple databases. Critical Care Time devoted to patient care services described in this note  Time spent: 40 minutes     WOODS, Geraldo Docker, MD [facility] Pager [phone]  If 7PM-7AM, please contact night-coverage [email or web address] Password [facility] 01/29/2019, 7:18 PM

## 2019-01-29 NOTE — Progress Notes (Signed)
INR 6.6.  [facility] paged with INR results.  No new orders received.  Handoff shift report given to Venora Maples, Therapist, sports.  Jodell Cipro

## 2019-01-30 ENCOUNTER — Other Ambulatory Visit: Payer: Self-pay

## 2019-01-30 ENCOUNTER — Inpatient Hospital Stay (HOSPITAL_COMMUNITY): Payer: Medicare Other

## 2019-01-30 ENCOUNTER — Encounter (HOSPITAL_COMMUNITY): Payer: Self-pay | Admitting: General Practice

## 2019-01-30 DIAGNOSIS — I484 Atypical atrial flutter: Secondary | ICD-10-CM

## 2019-01-30 LAB — CBC
HCT: 31.4 % — ABNORMAL LOW (ref 36.0–46.0)
Hemoglobin: 10.2 g/dL — ABNORMAL LOW (ref 12.0–15.0)
MCH: 28.3 pg (ref 26.0–34.0)
MCHC: 32.5 g/dL (ref 30.0–36.0)
MCV: 87.2 fL (ref 80.0–100.0)
Platelets: 109 10*3/uL — ABNORMAL LOW (ref 150–400)
RBC: 3.6 MIL/uL — ABNORMAL LOW (ref 3.87–5.11)
RDW: 16.1 % — ABNORMAL HIGH (ref 11.5–15.5)
WBC: 16.1 10*3/uL — ABNORMAL HIGH (ref 4.0–10.5)
nRBC: 0.1 % (ref 0.0–0.2)

## 2019-01-30 LAB — COMPREHENSIVE METABOLIC PANEL
ALT: 13 U/L (ref 0–44)
AST: 34 U/L (ref 15–41)
Albumin: 2.8 g/dL — ABNORMAL LOW (ref 3.5–5.0)
Alkaline Phosphatase: 75 U/L (ref 38–126)
Anion gap: 12 (ref 5–15)
BUN: 60 mg/dL — ABNORMAL HIGH (ref 8–23)
CO2: 17 mmol/L — ABNORMAL LOW (ref 22–32)
Calcium: 8.4 mg/dL — ABNORMAL LOW (ref 8.9–10.3)
Chloride: 106 mmol/L (ref 98–111)
Creatinine, Ser: 1.72 mg/dL — ABNORMAL HIGH (ref 0.44–1.00)
GFR calc Af Amer: 35 mL/min — ABNORMAL LOW (ref >60)
GFR calc non Af Amer: 30 mL/min — ABNORMAL LOW (ref >60)
Glucose, Bld: 94 mg/dL (ref 70–99)
Potassium: 3.8 mmol/L (ref 3.5–5.1)
Sodium: 135 mmol/L (ref 135–145)
Total Bilirubin: 1.5 mg/dL — ABNORMAL HIGH (ref 0.3–1.2)
Total Protein: 8.1 g/dL (ref 6.5–8.1)

## 2019-01-30 LAB — MAGNESIUM: Magnesium: 2.2 mg/dL (ref 1.7–2.4)

## 2019-01-30 LAB — PROTIME-INR
INR: 7.7 (ref 0.8–1.2)
Prothrombin Time: 64 seconds — ABNORMAL HIGH (ref 11.4–15.2)

## 2019-01-30 LAB — CULTURE, BLOOD (ROUTINE X 2)
Culture: NO GROWTH
Culture: NO GROWTH
Special Requests: ADEQUATE
Special Requests: ADEQUATE

## 2019-01-30 MED ORDER — ENSURE ENLIVE PO LIQD
237.0000 mL | Freq: Two times a day (BID) | ORAL | Status: DC
  Administered 2019-01-31 – 2019-02-17 (×27): 237 mL via ORAL

## 2019-01-30 MED ORDER — URSODIOL 300 MG PO CAPS
300.0000 mg | ORAL_CAPSULE | Freq: Two times a day (BID) | ORAL | Status: DC
  Administered 2019-01-30 – 2019-02-15 (×33): 300 mg via ORAL
  Filled 2019-01-30 (×40): qty 1

## 2019-01-30 MED ORDER — SODIUM BICARBONATE-DEXTROSE 150-5 MEQ/L-% IV SOLN
150.0000 meq | INTRAVENOUS | Status: DC
  Administered 2019-01-30: 20:00:00 150 meq via INTRAVENOUS
  Filled 2019-01-30: qty 1000

## 2019-01-30 MED ORDER — VITAMIN K1 10 MG/ML IJ SOLN
1.0000 mg | Freq: Once | INTRAVENOUS | Status: AC
  Administered 2019-01-30: 18:00:00 1 mg via INTRAVENOUS
  Filled 2019-01-30: qty 0.1

## 2019-01-30 NOTE — Progress Notes (Addendum)
Progress Note  Patient Name: Marie Malone Date of Encounter: 01/30/2019  Primary Cardiologist: [name], MD   Subjective   No significant overnight events. Patient very drowsy this morning. She reports pain in her left leg this morning but denies pain anywhere else. Chest pain. Breathing okay this morning. Continue to have diarrhea and poor PO intake.  Per Hospitalist note yesterday, patient complained of substernal chest pain with radiation to left shoulder yesterday. EKG from yesterday shows atrial flutter with no significant ST/T changes from prior tracings. I asked patient about chest pain today but she could not remember when exactly she had the pain or what it felt like. Currently chest pain free. Some chest tender with palpation of chest wall.  Inpatient Medications    Scheduled Meds: . aspirin EC  81 mg Oral Daily  . atorvastatin  40 mg Oral q1800  . metoprolol succinate  12.5 mg Oral Daily   Continuous Infusions: . sodium chloride 10 mL/hr at 01/29/19 1524  . cefTRIAXone (ROCEPHIN)  IV 2 g (01/29/19 0943)   PRN Meds: acetaminophen **OR** acetaminophen, ondansetron **OR** ondansetron (ZOFRAN) IV   Vital Signs    Vitals:   01/29/19 2303 01/30/19 0053 01/30/19 0553 01/30/19 0554  BP:  118/68  110/70  Pulse: 81 91  82  Resp: (!) 24 (!) 23  (!) 21  Temp:   98 F (36.7 C)   TempSrc:   Oral   SpO2: 100% 100%  100%  Weight:      Height:        Intake/Output Summary (Last 24 hours) at 01/30/2019 0729 Last data filed at 01/29/2019 2141 Gross per 24 hour  Intake 1171.57 ml  Output -  Net 1171.57 ml   Last 3 Weights 01/29/2019 01/28/2019 01/27/2019  Weight (lbs) 142 lb 8 oz 150 lb 5.7 oz 138 lb 3.7 oz  Weight (kg) 64.638 kg 68.2 kg 62.7 kg      Telemetry    Suspect she is still in atrial flutter with controlled ventricular rate. - Personally Reviewed  ECG    No new ECG tracing today. EKG from yesterday afternoon shows atrial flutter with ventricular  rate of 91 bpm and ST/T changes in inferior leads. - Personally Reviewed  Physical Exam   GEN: 68 year old female resting comfortably in no acute distress.   Neck: Supple. No significant JVD. Cardiac: RRR. Mechanical click noted. No murmurs, rubs, or gallops.  Respiratory: Clear to auscultation bilaterally. No significant wheezes, rhonchi, or rales.  GI: Soft, non-distended, and non-tender. Bowel sounds present. MS: Tight left lower extremity edema.  Neuro:  No focal deficits. Psych: Drowsy.  Labs    High Sensitivity Troponin:   Recent Labs  Lab 01/24/19 0259 01/24/19 0738 01/24/19 0807 01/29/19 1635 01/29/19 1835  TROPONINIHS 132* 111* 113* 791* 718*      Chemistry Recent Labs  Lab 01/28/19 0441  01/29/19 0527 01/29/19 1635 01/30/19 0327  NA 135   < > 136 136 135  K 4.3   < > 4.1 4.1 3.8  CL 108   < > 106 107 106  CO2 17*   < > 19* 18* 17*  GLUCOSE 92   < > 105* 121* 94  BUN 55*   < > 60* 59* 60*  CREATININE 1.64*   < > 1.78* 1.63* 1.72*  CALCIUM 8.4*   < > 8.5* 8.3* 8.4*  PROT 8.1  --  7.8  --  8.1  ALBUMIN 2.3*  --  2.2*  --  2.8*  AST 32  --  38  --  34  ALT 16  --  14  --  13  ALKPHOS 65  --  66  --  75  BILITOT 2.0*  --  1.6*  --  1.5*  GFRNONAA 32*   < > 29* 32* 30*  GFRAA 37*   < > 34* 37* 35*  ANIONGAP 10   < > 11 11 12    < > = values in this interval not displayed.     Hematology Recent Labs  Lab 01/28/19 0441 01/29/19 0527 01/30/19 0327  WBC 11.4* 13.2* 16.1*  RBC 4.02 3.92 3.60*  HGB 11.4* 11.2* 10.2*  HCT 36.1 34.7* 31.4*  MCV 89.8 88.5 87.2  MCH 28.4 28.6 28.3  MCHC 31.6 32.3 32.5  RDW 16.4* 16.3* 16.1*  PLT 79* 96* 109*    BNP Recent Labs  Lab 01/15/2019 2212  BNP 1,699.1*     DDimer No results for input(s): DDIMER in the last 168 hours.   Radiology    Dg Chest Port 1 View  Result Date: 01/29/2019 CLINICAL DATA:  Shortness of breath. History of stroke, atrial fibrillation, hypertension. EXAM: PORTABLE CHEST 1 VIEW  COMPARISON:  Chest x-rays dated 01/25/2019 and 01/19/2019. FINDINGS: Stable cardiomegaly. Probable mild atelectasis and/or small pleural effusion at the LEFT lung base. Lungs are otherwise clear. No pneumothorax seen. IMPRESSION: Probable mild atelectasis and/or small pleural effusion at the LEFT lung base. Lungs otherwise clear. Electronically Signed   By: Franki Cabot M.D.   On: 01/29/2019 08:02    Cardiac Studies   Echocardiogram 01/24/2019: Impressions: 1. Left ventricular ejection fraction, by visual estimation, is 45 to 50%. The left ventricle has mildly decreased function. Normal left ventricular size. Left ventricular septal wall thickness was normal. Normal left ventricular posterior wall  thickness. There is no left ventricular hypertrophy.  2. Indeterminate diastolic function due to mechanical mitral valve pattern of LV diastolic filling.  3. Right ventricular pressure overload and right ventricular volume overload.  4. Global right ventricle has moderately reduced systolic function.The right ventricular size is moderately enlarged. No increase in right ventricular wall thickness.  5. Left atrial size was moderately dilated.  6. Right atrial size was severely dilated.  7. The mitral valve has been repaired/replaced. No evidence of mitral valve regurgitation. No evidence of mitral stenosis.  8. The tricuspid valve is normal in structure. Tricuspid valve regurgitation mild-moderate.  9. The aortic valve The aortic valve is normal in structure. Aortic valve regurgitation was not visualized by color flow Doppler. Structurally normal aortic valve, with no evidence of sclerosis or stenosis. 10. The pulmonic valve was normal in structure. Pulmonic valve regurgitation is mild by color flow Doppler. 11. Moderately elevated pulmonary artery systolic pressure. 12. The inferior vena cava is dilated in size with <50% respiratory variability, suggesting right atrial pressure of 15 mmHg.  _______________  Lower Extremity Vascular Ultrasound 01/25/2019 (Preliminary Read): Summary: Right: There is no evidence of deep vein thrombosis in the lower extremity. However, portions of this examination were limited- see technologist comments above. Left: There is no evidence of deep vein thrombosis in the lower extremity. However, portions of this examination were limited- see technologist comments above. Significant fluid noted in the posterior calf, etiology unknown.  Patient Profile   Ms. Cabreja is a 68 y.o. female with a history of rheumatic fever/mitral valve stenosis s/p valvuloplasty at [facility] in 2008, CAD s/p CABG, mechanical MVR, and MAZE procedure  in 05/2016, paroxysmal atrial fibrillation on Coumadin, PAD s/p endarterectomy in 03/2016 who was admitted on 01/24/2019 for acute on chronic CHF. Found to be in atrial fibrillation on presentation to the ED but spontaneous converted back to normal sinus rhythm. However, then on 01/28/2019 he was noted to be in atrial flutter.  Assessment & Plan    Atrial Flutter and History of Paroxysmal Atrial Fibrillation s/p MAZE in 2018 - Looks to still be in atrial flutter on telemetry with controlled ventricular rate but will check repeat EKG to confirm. - K 3.8 today. - Mg 2.2 today. - Continue Toprol-XL 12.5mg  daily. - Coumadin currently on hold due to INR of 7.7. Hemoglobin 10.2 today, down from 11.2. No overt bleeding. Wondering if she needs reversal. - If she remains in atrial flutter, consider DCCV prior to discharge once acute issues resolve.  Acute on Chronic Combined CHF - Patient presented with shortness of breath.  - Chest x-ray showed mild cardiomegaly with interstitial pulmonary edema with small right pleural effusion. - BNP elevated at 1,699.1. - Echo showed LVEF of 45-50% with right ventricular dysfunction. See full report above. - Initially started on IV Lasix. However, this was stopped on 01/26/2019. - I/O's inacurrate. Urine  output has not been documented. - Does not appear significantly volume overloaded on exam. Continue to hold diuretics given diarrhea, poor PO intake, and elevated creatinine. - Continue to monitor daily weights, strict I/O's, and renal function.  Chest Pain with CAD s/p CABG in 2018 - Patient reportedly complained of substernal chest pain that radiated to left shoulder yesterday. I asked patient about this today and patient could not remember exactly when this happened or elaborate on the pain. - EKG yesterday showed minimal ST depression in inferior leads with T wave inversion in lead III but these have been seen on prior tracings. - Repeat high-sensitivity troponin elevated at 791 >> 718 (up from 111 >> 113 on admission). - Currently chest pain free. She had some mild tenderness on exam with palpation of chest wall. - Will continue to monitor.   Hypotensive - Systolic BP was in the 70'V to 90's yesterday. Improved to the 110's to 120's today after 500 cc bolus of normal saline and albumin yesterday.  Mechanical MVR - Patient has history of rheumatic fever. S/p valvuloplasty at [facility] in 2008 and then mechanical MVR at time of CABG and Maze procedure in 05/2016. - INR 7.7 today, up from 6.6 yesterday.  Possible OSA - Patient continue to have desaturation overnight. - Consider outpatient sleep study.  AKI - Creatinine 1.17 on admission. Peaked at 1.78 yesterday and 1.72 today.  - Continue to hold Nephrotoxic agents. - Continue daily BMETs.  Left Lower Extremity Cellulitis vs SIRS - Management per primary team.  For questions or updates, please contact [place] Please consult [email or web address] for contact info under        Signed, Darreld Mclean, PA-C  01/30/2019, 7:29 AM

## 2019-01-30 NOTE — Progress Notes (Signed)
PROGRESS NOTE    Marie Malone  HYW:[id number] DOB: 1951/03/22 DOA: 01/13/2019 PCP: Debbrah Alar, NP   Brief Narrative:  Marie Malone is a 68 y.o. 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  Patient states over the last 2 weeks patient has been getting progressively short of breath on exertion.  Denies any chest pain fever chills or productive cough.  Patient's primary care physician advised her to increase her Lasix twice daily which she is supposed to start today.  Because of worsening shortness of breath patient came to the ER.  EMS was called and patient initially was placed on BiPAP.  Patient also had complained of some left calf cramping.  ED Course: In the ER patient was hypoxic placed on BiPAP chest x-ray shows features consistent with CHF.  Lab work show a BNP of 1699, sodium 139 potassium 4.2 creatinine 1.1 WBC count of 7.6 hemoglobin 12.6 platelets 82 and EKG was showing A. fib rate at 106 bpm.  Patient was given 60 mg IV Lasix following which patient was able to be weaned off BiPAP and presently is on 5 L oxygen.  Patient admitted for further management of acute CHF.  COVID-19 test was negative.  On exam patient has good peripheral pulses with some edema in the lower extremity.   Subjective: 9/22 last 24 hours afebrile.  Increasing L LE pain  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   S/P CABG x 1   Acute on chronic systolic CHF (congestive heart failure) (HCC)   Atrial fibrillation, chronic   Cellulitis of left leg   Acute renal failure (HCC)   Primary biliary cirrhosis  (HCC)   Chest pain at rest   H/O mitral valve replacement with mechanical valve   Atypical atrial flutter (HCC)  Acute respiratory failure with hypoxia -Not on home O2 -Titrate O2 to maintain SPO2 89 to 93% -PCXR 9/21; atelectasis left lung base see results below  Chest pain acute started 9/21 - Patient borderline hypotensive with MAP 60-63 - Obtain 12-lead EKG; atrial flutter, nonspecific ST-T wave changes no changes from previous EKG - Troponin high-sensitivity 791/ - Albumin 50 g - Normal saline bolus 581ml - BMP, Mg WNL - BP improved, after albumin and normal saline bolus.  Acute on chronic systolic CHF -Strict in and out +3.1 L (inaccurate) -Daily weight Filed Weights   01/27/19 0500 01/28/19 0412 01/29/19 0508  Weight: 62.7 kg 68.2 kg 64.6 kg  - EF 40 to 45% see echocardiogram results below - Per cardiology discontinue Lasix, ARB, and K-Dur -9/20 decreased metoprolol XR 12.5 mg daily (patient's BP on the hypotensive side) - Cardiology would like to perform DCCV for chronic A. fib however must resolve other health issues first.  Mechanical mitral valve - Continue warfarin, monitor INR Recent Labs  Lab 01/26/19 0354 01/27/19 0421 01/28/19 0441 01/29/19 0527 01/30/19 0327  INR 3.4* 3.8* 4.7* 6.6* 7.7*  -Therapeutic INR goal 2.5-3.5 -9/20 supratherapeutic INR most likely secondary to addition of antibiotic, hold Coumadin -9/22 INR continues to climb vitamin K 1 mg, do not want to be over aggressive with reversal and patient  with mechanical MV.  If INR does not stabilize by the a.m. will repeat dosing  Atrial fibrillation chronic - Currently NSR - Anticoagulated for her mechanical mitral valve  LLE cellulitis vs SIRS - Blood culture, urine culture NGTD  - Patient also with loose stools (possibly from Ursodiol) however patient has been chronically on this medication. -C. difficile PCR negative - Once all stool and cultures drawn.  Start empiric antibiotic initially  for cellulitis.  - See CHF and PBS  - 9/19 start empiric antibiotic treatment.  Complete 7-day course  LLE posterior knee bleed vs compartment syndrome? - 9/21 L LE cellulitis slightly worse today, may be secondary to hypoperfusion with low BP. -9/22 LLE fullness and pain increasing obtain CT LLE, given patient's supratherapeutic INR evaluate for bleeding and to left posterior knee joint and calf ADDENDUM; negative for hematoma/compartment syndrome see results of CT below - Would consult vascular surgery on 9/23  Acute renal failure (baseline Cr 1.0) Recent Labs  Lab 01/28/19 0441 01/28/19 1551 01/29/19 0527 01/29/19 1635 01/30/19 0327  CREATININE 1.64* 1.51* 1.78* 1.63* 1.72*  -Most likely secondary to overdiuresis and diarrhea. - 9/20 obtain urine lites; calculate FeNa -9/22 change KVO normal saline to sodium bicarb drip 30 ml/hr - ABG pending  Metabolic alkalosis - See renal failure  Primary biliary cirrhosis(PBC) - 9/22 restart Ursodial 300 mg BID.  Was held secondary to thinking causing diarrhea however patient still having diarrhea. - Patient last 2 days stated was not having diarrhea (daughter not present) believe secondary to myself being a female  Diarrhea - 9/22 patient reports diarrhea to cardiology, though has denied diarrhea to me on several occasions. -Fecal fat pending -GI pathogen panel by PCR pending -Lactoferrin ending -Stool osmolality pending -Stool K/NA pending - See PBC.    Goals of care - On 9/21 place PT/OT request patient was able to ambulate independently at home prior to this hospitalization.  Due to the severity of the LLE cellulitis unsure patient will be able to discharge home.    DVT prophylaxis: Warfarin Code Status: Full Family Communication: 9/22 daughter at bedside discussed plan of care answered all questions Disposition Plan: TBD     Consultants:  Cardiology    Procedures/Significant Events:  9/16 echocardiogram:Left  ventricular ejection fraction, EF = 45 to 50%.  - Indeterminate diastolic function due to mechanical mitral valve pattern of LV diastolic filling. -Right ventricular pressure overload and right ventricular volume overload. -Global right ventricle has moderately reduced systolic function.The right ventricular size is moderately enlarged.  -Left atrial  moderately dilated. -Right atrial severely dilated. -The mitral valve has been repaired/replaced. No evidence of mitral valve regurgitation. No evidence of mitral stenosis. -The tricuspid valve is normal in structure. Tricuspid valve regurgitation mild-moderate. - Moderately elevated pulmonary artery systolic pressure. -The inferior vena cava is dilated in size with <50% respiratory variability, suggesting right atrial pressure of 15 mmHg. 9/17 bilateral lower extremity Doppler;Right: There is no evidence of deep vein thrombosis in the lower extremity. However, portions of this examination were limited- see technologist comments above. Left: There is no evidence of deep vein thrombosis in the lower extremity. However, portions of this examination were limited- see technologist comments above. Significant fluid noted in the posterior calf, etiology unknown 9/21 PCXR; mild atelectasis and/or small pleural effusion at the left lung base 9/22 CT LLE Wo contrast;-Negative for hematoma or other acute finding. -Tricompartmental DJD of the knee with small effusion     I have personally reviewed and interpreted  all radiology studies and my findings are as above.  VENTILATOR SETTINGS:    Cultures 9/17 blood RIGHT hand NGTD 9/17 blood LEFT hand NGTD 9/17 urine positive multiple bacterial morphotypes present, none predominant 9/18 blood RIGHT forearm NGTD 9/18 blood LEFT hand NGTD 9/18 C. difficile C. difficile antigen negative, C. difficile toxin negative 9/20 2 GI panel by PCR pending      Antimicrobials: Anti-infectives (From admission,  onward)   Start     Stop   01/27/19 0930  cefTRIAXone (ROCEPHIN) 2 g in sodium chloride 0.9 % 100 mL IVPB             Devices    LINES / TUBES:      Continuous Infusions: . sodium chloride 10 mL/hr at 01/29/19 1524  . cefTRIAXone (ROCEPHIN)  IV 2 g (01/29/19 0943)     Objective: Vitals:   01/30/19 0053 01/30/19 0553 01/30/19 0554 01/30/19 0954  BP: 118/68  110/70 (!) 143/81  Pulse: 91  82   Resp: (!) 23  (!) 21 (!) 21  Temp:  98 F (36.7 C)    TempSrc:  Oral    SpO2: 100%  100%   Weight:      Height:        Intake/Output Summary (Last 24 hours) at 01/30/2019 1534 Last data filed at 01/30/2019 1018 Gross per 24 hour  Intake 949.57 ml  Output -  Net 949.57 ml   Filed Weights   01/27/19 0500 01/28/19 0412 01/29/19 0508  Weight: 62.7 kg 68.2 kg 64.6 kg   Physical Exam:  General: A/O x4, positive  acute respiratory distress Eyes: negative scleral hemorrhage, negative anisocoria, negative icterus ENT: Negative Runny nose, negative gingival bleeding, Neck:  Negative scars, masses, torticollis, lymphadenopathy, JVD Lungs: Clear to auscultation bilaterally without wheezes or crackles Cardiovascular: Regular rate and rhythm without murmur gallop or rub normal S1 and S2 Abdomen: negative abdominal pain, nondistended, positive soft, bowel sounds, no rebound, no ascites, no appreciable mass Extremities: L LE calf extremely tight extending cephalad to mid thigh increased pain, warmth from 2/21 Skin: Pur de [place] skin LLE Psychiatric:  Negative depression, negative anxiety, negative fatigue, negative mania  Central nervous system:  Cranial nerves II through XII intact, tongue/uvula midline, all extremities muscle strength 5/5, sensation intact throughout, negative dysarthria, negative expressive aphasia, negative receptive aphasia.     Data Reviewed: Care during the described time interval was provided by me .  I have reviewed this patient's available data, including  medical history, events of note, physical examination, and all test results as part of my evaluation.   CBC: Recent Labs  Lab 01/25/2019 2211  01/24/19 0316 01/25/19 0217 01/26/19 0354 01/27/19 0421 01/28/19 0441 01/29/19 0527 01/30/19 0327  WBC 7.6  --  10.9* 9.0 7.0 10.8* 11.4* 13.2* 16.1*  NEUTROABS 5.7  --  9.5* 7.6  --   --   --   --   --   HGB 12.6   < > 11.7* 10.7* 12.0 11.9* 11.4* 11.2* 10.2*  HCT 41.6   < > 37.1 34.0* 38.8 37.7 36.1 34.7* 31.4*  MCV 97.7  --  92.1 91.6 93.0 90.4 89.8 88.5 87.2  PLT 82*  --  68* 64* 59* 70* 79* 96* 109*   < > = values in this interval not displayed.   Basic Metabolic Panel: Recent Labs  Lab 01/27/19 0421 01/28/19 0441 01/28/19 1551 01/29/19 0527 01/29/19 1635 01/30/19 0327  NA 136 135 136 136 136 135  K 4.6 4.3 4.5 4.1 4.1 3.8  CL 106 108 108 106 107 106  CO2 18* 17* 17* 19* 18* 17*  GLUCOSE 76 92 103* 105* 121* 94  BUN 52* 55* 56* 60* 59* 60*  CREATININE 1.54* 1.64* 1.51* 1.78* 1.63* 1.72*  CALCIUM 8.7* 8.4* 8.3* 8.5* 8.3* 8.4*  MG 2.1 2.2  --  2.2 2.4 2.2   GFR: Estimated Creatinine Clearance: 28 mL/min (A) (by C-G formula based on SCr of 1.72 mg/dL (H)). Liver Function Tests: Recent Labs  Lab 01/26/19 0354 01/27/19 0421 01/28/19 0441 01/29/19 0527 01/30/19 0327  AST 35 31 32 38 34  ALT 17 15 16 14 13   ALKPHOS 52 69 65 66 75  BILITOT 2.5* 2.0* 2.0* 1.6* 1.5*  PROT 8.7* 8.1 8.1 7.8 8.1  ALBUMIN 2.7* 2.4* 2.3* 2.2* 2.8*   No results for input(s): LIPASE, AMYLASE in the last 168 hours. No results for input(s): AMMONIA in the last 168 hours. Coagulation Profile: Recent Labs  Lab 01/26/19 0354 01/27/19 0421 01/28/19 0441 01/29/19 0527 01/30/19 0327  INR 3.4* 3.8* 4.7* 6.6* 7.7*   Cardiac Enzymes: No results for input(s): CKTOTAL, CKMB, CKMBINDEX, TROPONINI in the last 168 hours. BNP (last 3 results) No results for input(s): PROBNP in the last 8760 hours. HbA1C: No results for input(s): HGBA1C in the last 72  hours. CBG: No results for input(s): GLUCAP in the last 168 hours. Lipid Profile: No results for input(s): CHOL, HDL, LDLCALC, TRIG, CHOLHDL, LDLDIRECT in the last 72 hours. Thyroid Function Tests: No results for input(s): TSH, T4TOTAL, FREET4, T3FREE, THYROIDAB in the last 72 hours. Anemia Panel: No results for input(s): VITAMINB12, FOLATE, FERRITIN, TIBC, IRON, RETICCTPCT in the last 72 hours. Urine analysis:    Component Value Date/Time   COLORURINE YELLOW 01/25/2019 0153   APPEARANCEUR HAZY (A) 01/25/2019 0153   LABSPEC 1.018 01/25/2019 0153   PHURINE 5.0 01/25/2019 0153   GLUCOSEU NEGATIVE 01/25/2019 0153   GLUCOSEU NEGATIVE 05/15/2018 1525   HGBUR NEGATIVE 01/25/2019 0153   BILIRUBINUR NEGATIVE 01/25/2019 0153   BILIRUBINUR NEG 11/07/2017 1013   KETONESUR NEGATIVE 01/25/2019 0153   PROTEINUR NEGATIVE 01/25/2019 0153   UROBILINOGEN 1.0 05/15/2018 1525   NITRITE NEGATIVE 01/25/2019 0153   LEUKOCYTESUR NEGATIVE 01/25/2019 0153   Sepsis Labs: @LABRCNTIP (procalcitonin:4,lacticidven:4)  ) Recent Results (from the past 240 hour(s))  SARS Coronavirus 2 [facility] order, Performed in [facility] hospital lab) Nasopharyngeal Nasopharyngeal Swab     Status: None   Collection Time: 01/17/2019 10:12 PM   Specimen: Nasopharyngeal Swab  Result Value Ref Range Status   SARS Coronavirus 2 NEGATIVE NEGATIVE Final    Comment: (NOTE) If result is NEGATIVE SARS-CoV-2 target nucleic acids are NOT DETECTED. The SARS-CoV-2 RNA is generally detectable in upper and lower  respiratory specimens during the acute phase of infection. The lowest  concentration of SARS-CoV-2 viral copies this assay can detect is 250  copies / mL. A negative result does not preclude SARS-CoV-2 infection  and should not be used as the sole basis for treatment or other  patient management decisions.  A negative result may occur with  improper specimen collection / handling, submission of specimen other  than  nasopharyngeal swab, presence of viral mutation(s) within the  areas targeted by this assay, and inadequate number of viral copies  (<250 copies / mL). A negative result must be combined with clinical  observations, patient history, and epidemiological information. If result is POSITIVE SARS-CoV-2 target nucleic acids are DETECTED. The SARS-CoV-2 RNA  is generally detectable in upper and lower  respiratory specimens dur ing the acute phase of infection.  Positive  results are indicative of active infection with SARS-CoV-2.  Clinical  correlation with patient history and other diagnostic information is  necessary to determine patient infection status.  Positive results do  not rule out bacterial infection or co-infection with other viruses. If result is PRESUMPTIVE POSTIVE SARS-CoV-2 nucleic acids MAY BE PRESENT.   A presumptive positive result was obtained on the submitted specimen  and confirmed on repeat testing.  While 2019 novel coronavirus  (SARS-CoV-2) nucleic acids may be present in the submitted sample  additional confirmatory testing may be necessary for epidemiological  and / or clinical management purposes  to differentiate between  SARS-CoV-2 and other Sarbecovirus currently known to infect humans.  If clinically indicated additional testing with an alternate test  methodology [phone]) is advised. The SARS-CoV-2 RNA is generally  detectable in upper and lower respiratory sp ecimens during the acute  phase of infection. The expected result is Negative. Fact Sheet for Patients:  StrictlyIdeas.no Fact Sheet for Healthcare Providers: BankingDealers.co.za This test is not yet approved or cleared by the [place] FDA and has been authorized for detection and/or diagnosis of SARS-CoV-2 by FDA under an Emergency Use Authorization (EUA).  This EUA will remain in effect (meaning this test can be used) for the duration of the  COVID-19 declaration under Section 564(b)(1) of the Act, 21 U.S.C. section 360bbb-3(b)(1), unless the authorization is terminated or revoked sooner. Performed at [facility], [place] [address], [place], [place] [address]   Culture, Urine     Status: None   Collection Time: 01/25/19  1:20 AM   Specimen: Urine, Catheterized  Result Value Ref Range Status   Specimen Description URINE, CATHETERIZED  Final   Special Requests   Final    NONE Performed at [facility], [place] [address] [place], [place] [address]    Culture   Final    Multiple bacterial morphotypes present, none predominant. Suggest appropriate recollection if clinically indicated.   Report Status 01/26/2019 FINAL  Final  Culture, blood (routine x 2)     Status: None   Collection Time: 01/25/19  2:16 AM   Specimen: BLOOD RIGHT HAND  Result Value Ref Range Status   Specimen Description BLOOD RIGHT HAND  Final   Special Requests   Final    BOTTLES DRAWN AEROBIC AND ANAEROBIC Blood Culture adequate volume   Culture   Final    NO GROWTH 5 DAYS Performed at [facility], [address], [place], [place] [address]    Report Status 01/30/2019 FINAL  Final  Culture, blood (routine x 2)     Status: None   Collection Time: 01/25/19  2:16 AM   Specimen: BLOOD LEFT HAND  Result Value Ref Range Status   Specimen Description BLOOD LEFT HAND  Final   Special Requests   Final    BOTTLES DRAWN AEROBIC AND ANAEROBIC Blood Culture adequate volume   Culture   Final    NO GROWTH 5 DAYS Performed at [facility], [place] [address] [place], [place] [address]    Report Status 01/30/2019 FINAL  Final  C difficile quick scan w PCR reflex     Status: None   Collection Time: 01/26/19  6:33 PM   Specimen: STOOL  Result Value Ref Range Status   C Diff antigen NEGATIVE NEGATIVE Final   C Diff toxin NEGATIVE  NEGATIVE Final   C Diff interpretation No C. difficile detected.  Final    Comment: Performed at [facility], [place] [address], [place], [place] [address]  Culture, blood (routine x 2)     Status: None (Preliminary result)   Collection Time: 01/26/19  8:10 PM   Specimen: BLOOD  Result Value Ref Range Status   Specimen Description BLOOD BLOOD RIGHT FOREARM  Final   Special Requests   Final    BOTTLES DRAWN AEROBIC AND ANAEROBIC Blood Culture adequate volume   Culture   Final    NO GROWTH 4 DAYS Performed at [facility], [place] [address], [place], [place] [address]    Report Status PENDING  Incomplete  Culture, blood (routine x 2)     Status: None (Preliminary result)   Collection Time: 01/26/19  8:25 PM   Specimen: BLOOD LEFT HAND  Result Value Ref Range Status   Specimen Description BLOOD LEFT HAND  Final   Special Requests   Final    BOTTLES DRAWN AEROBIC ONLY Blood Culture adequate volume   Culture   Final    NO GROWTH 4 DAYS Performed at [facility], [place] [address] [place], [place] [address]    Report Status PENDING  Incomplete  Culture, Urine     Status: Abnormal   Collection Time: 01/28/19  6:40 PM   Specimen: Urine, Random  Result Value Ref Range Status   Specimen Description URINE, RANDOM  Final   Special Requests NONE  Final   Culture (A)  Final    <10,000 COLONIES/mL INSIGNIFICANT GROWTH Performed at [facility], [place] [address] [place], [place] [address]    Report Status 01/29/2019 FINAL  Final         Radiology Studies: Dg Chest Port 1 View  Result Date: 01/29/2019 CLINICAL DATA:  Shortness of breath. History of stroke, atrial fibrillation, hypertension. EXAM: PORTABLE CHEST 1 VIEW COMPARISON:  Chest x-rays dated 01/25/2019 and 01/09/2019. FINDINGS: Stable cardiomegaly. Probable mild atelectasis and/or small pleural effusion at the LEFT lung base. Lungs are otherwise clear. No pneumothorax seen. IMPRESSION: Probable mild atelectasis and/or small pleural effusion at the LEFT lung base. Lungs otherwise clear. Electronically Signed   By: Franki Cabot M.D.   On: 01/29/2019 08:02        Scheduled Meds: . aspirin EC  81 mg Oral Daily  . atorvastatin  40 mg Oral q1800  . metoprolol succinate  12.5 mg Oral Daily   Continuous Infusions: . sodium chloride 10 mL/hr at 01/29/19 1524  . cefTRIAXone (ROCEPHIN)  IV 2 g (01/29/19 0943)     LOS: 6 days   The patient is critically ill with multiple organ systems failure and requires high complexity decision making for assessment and support, frequent evaluation and titration of therapies, application of advanced monitoring technologies and extensive interpretation of multiple databases. Critical Care Time devoted to patient care services described in this note  Time spent: 40 minutes     Rae Sutcliffe, Geraldo Docker, MD [facility] Pager [phone]  If 7PM-7AM, please contact night-coverage [email or web address] Password [facility] 01/30/2019, 3:34 PM

## 2019-01-30 NOTE — TOC Initial Note (Signed)
Transition of Care [facility]) - Initial/Assessment Note    Patient Details  Name: Marie Malone MRN: [id number] Date of Birth: 28-Dec-1950  [facility]) CM/SW Contact:    Bethena Roys, RN Phone Number: 01/30/2019, 3:15 PM  Clinical Narrative:   Pt presented for shortness of breath- acute respiratory failure- PTA from home with support of husband. Per patient she was independent prior to arrival. Patient has PCP Debbrah Alar NP- Patient uses [facility] on [address] for Medications. CM will continue to monitor for additional transition of care needs.                Expected Discharge Plan: Home/Self Care Barriers to Discharge: No Barriers Identified   Patient Goals and CMS Choice Patient states their goals for this hospitalization and ongoing recovery are:: "to return home"   Choice offered to / list presented to : NA  Expected Discharge Plan and Services Expected Discharge Plan: Home/Self Care In-house Referral: NA Discharge Planning Services: CM Consult Post Acute Care Choice: NA Living arrangements for the past 2 months: Single Family Home                  Prior Living Arrangements/Services Living arrangements for the past 2 months: Single Family Home Lives with:: Spouse Patient language and need for interpreter reviewed:: Yes Do you feel safe going back to the place where you live?: Yes      Need for Family Participation in Patient Care: Yes (Comment) Care giver support system in place?: Yes (comment)   Criminal Activity/Legal Involvement Pertinent to Current Situation/Hospitalization: No - Comment as needed  Activities of Daily Living Home Assistive Devices/Equipment: None ADL Screening (condition at time of admission) Patient's cognitive ability adequate to safely complete daily activities?: Yes Is the patient deaf or have difficulty hearing?: No Does the patient have difficulty seeing, even when wearing glasses/contacts?: No Does the patient  have difficulty concentrating, remembering, or making decisions?: No Patient able to express need for assistance with ADLs?: Yes Does the patient have difficulty dressing or bathing?: Yes Independently performs ADLs?: Yes (appropriate for developmental age) Does the patient have difficulty walking or climbing stairs?: Yes Weakness of Legs: Left Weakness of Arms/Hands: None  Permission Sought/Granted Permission sought to share information with : Family Supports    Emotional Assessment   Attitude/Demeanor/Rapport: Engaged Affect (typically observed): Accepting Orientation: : Oriented to Situation, Oriented to  Time, Oriented to Place, Oriented to Self Alcohol / Substance Use: Not Applicable Psych Involvement: No (comment)  Admission diagnosis:  Acute respiratory failure with hypoxia (HCC) [J96.01] Acute on chronic congestive heart failure, unspecified heart failure type [facility]) [I50.9] Patient Active Problem List   Diagnosis Date Noted  . Atypical atrial flutter ([place])   . Chest pain at rest 01/29/2019  . H/O mitral valve replacement with mechanical valve 01/29/2019  . Acute on chronic systolic CHF (congestive heart failure) ([place]) 01/27/2019  . Atrial fibrillation, chronic 01/27/2019  . Cellulitis of left leg 01/27/2019  . Acute renal failure ([place]) 01/27/2019  . 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 Acute respiratory failure with hypoxia ([place]) 01/24/2019  . Acute on chronic congestive heart failure ([place])   . Pruritus 07/08/2018  . Edema due to nutritional deficiency 07/08/2018  . Hypercholesterolemia 06/25/2016  . Postoperative anemia due to acute blood loss 06/08/2016  . S/P mitral valve replacement with mechanical valve + CABG x1 + Maze procedure 05/28/2016  . S/P CABG x 1 05/28/2016  . S/P Maze operation for  atrial fibrillation 05/28/2016  . Chronic diastolic CHF (congestive heart failure) ([place])   . Coronary artery disease involving native coronary artery without  angina pectoris 03/26/2016  . Second degree Mobitz I AV block 03/25/2016  . Tongue deviation   . Asymptomatic stenosis of right carotid artery s/p right CEA 03/10/16 03/10/2016  . Cerebral infarction due to embolism of left middle cerebral artery ([place]) 04/09/2014  . Valvular vegetation 04/09/2014  . Encounter for therapeutic drug monitoring 06/04/2013  . Paroxysmal atrial fibrillation ([place]) 04/19/2013  . Cardiac device in situ 04/19/2013  . Endocarditis 04/13/2013  . History of CVA (cerebrovascular accident) 04/12/2013  . Pulmonary hypertension ([place]) 04/06/2013  . S/P balloon mitral valvuloplasty 2008   . Essential hypertension 08/30/2012  . History of rheumatic fever 08/30/2012   PCP:  Debbrah Alar, NP Pharmacy:   [place], [place] [address] Phone: [phone] Fax: [phone]     Social Determinants of Health (SDOH) Interventions    Readmission Risk Interventions Readmission Risk Prevention Plan 01/30/2019  Transportation Screening Complete  Home Care Screening Complete  Medication Review (RN CM) Complete  Some recent data might be hidden

## 2019-01-31 LAB — CULTURE, BLOOD (ROUTINE X 2)
Culture: NO GROWTH
Culture: NO GROWTH
Special Requests: ADEQUATE
Special Requests: ADEQUATE

## 2019-01-31 LAB — COMPREHENSIVE METABOLIC PANEL
ALT: 14 U/L (ref 0–44)
AST: 38 U/L (ref 15–41)
Albumin: 2.4 g/dL — ABNORMAL LOW (ref 3.5–5.0)
Alkaline Phosphatase: 89 U/L (ref 38–126)
Anion gap: 12 (ref 5–15)
BUN: 60 mg/dL — ABNORMAL HIGH (ref 8–23)
CO2: 17 mmol/L — ABNORMAL LOW (ref 22–32)
Calcium: 8.6 mg/dL — ABNORMAL LOW (ref 8.9–10.3)
Chloride: 105 mmol/L (ref 98–111)
Creatinine, Ser: 1.56 mg/dL — ABNORMAL HIGH (ref 0.44–1.00)
GFR calc Af Amer: 39 mL/min — ABNORMAL LOW (ref >60)
GFR calc non Af Amer: 34 mL/min — ABNORMAL LOW (ref >60)
Glucose, Bld: 131 mg/dL — ABNORMAL HIGH (ref 70–99)
Potassium: 4 mmol/L (ref 3.5–5.1)
Sodium: 134 mmol/L — ABNORMAL LOW (ref 135–145)
Total Bilirubin: 1.5 mg/dL — ABNORMAL HIGH (ref 0.3–1.2)
Total Protein: 8.1 g/dL (ref 6.5–8.1)

## 2019-01-31 LAB — CBC
HCT: 33.6 % — ABNORMAL LOW (ref 36.0–46.0)
Hemoglobin: 11.4 g/dL — ABNORMAL LOW (ref 12.0–15.0)
MCH: 29.1 pg (ref 26.0–34.0)
MCHC: 33.9 g/dL (ref 30.0–36.0)
MCV: 85.7 fL (ref 80.0–100.0)
Platelets: 124 10*3/uL — ABNORMAL LOW (ref 150–400)
RBC: 3.92 MIL/uL (ref 3.87–5.11)
RDW: 16.1 % — ABNORMAL HIGH (ref 11.5–15.5)
WBC: 18.7 10*3/uL — ABNORMAL HIGH (ref 4.0–10.5)
nRBC: 0.2 % (ref 0.0–0.2)

## 2019-01-31 LAB — PROTIME-INR
INR: 2.7 — ABNORMAL HIGH (ref 0.8–1.2)
INR: 2.1 — ABNORMAL HIGH (ref 0.8–1.2)
Prothrombin Time: 28 seconds — ABNORMAL HIGH (ref 11.4–15.2)
Prothrombin Time: 23.3 seconds — ABNORMAL HIGH (ref 11.4–15.2)

## 2019-01-31 LAB — PROCALCITONIN: Procalcitonin: 2.09 ng/mL

## 2019-01-31 LAB — LACTOFERRIN, FECAL, QUALITATIVE: Lactoferrin, Fecal, Qual: NEGATIVE

## 2019-01-31 MED ORDER — SODIUM BICARBONATE-DEXTROSE 150-5 MEQ/L-% IV SOLN
150.0000 meq | INTRAVENOUS | Status: DC
  Filled 2019-01-31: qty 1000

## 2019-01-31 MED ORDER — SODIUM BICARBONATE 650 MG PO TABS
650.0000 mg | ORAL_TABLET | Freq: Three times a day (TID) | ORAL | Status: DC
  Administered 2019-01-31 – 2019-02-04 (×11): 650 mg via ORAL
  Filled 2019-01-31 (×11): qty 1

## 2019-01-31 MED ORDER — HEPARIN (PORCINE) 25000 UT/250ML-% IV SOLN
1050.0000 [IU]/h | INTRAVENOUS | Status: DC
  Administered 2019-01-31: 21:00:00 900 [IU]/h via INTRAVENOUS
  Filled 2019-01-31: qty 250

## 2019-01-31 MED ORDER — WARFARIN - PHARMACIST DOSING INPATIENT
Freq: Every day | Status: DC
  Administered 2019-02-01: 17:00:00

## 2019-01-31 MED ORDER — WARFARIN SODIUM 4 MG PO TABS
4.0000 mg | ORAL_TABLET | Freq: Once | ORAL | Status: AC
  Administered 2019-01-31: 4 mg via ORAL
  Filled 2019-01-31: qty 1

## 2019-01-31 MED ORDER — VANCOMYCIN HCL IN DEXTROSE 750-5 MG/150ML-% IV SOLN
750.0000 mg | INTRAVENOUS | Status: DC
  Administered 2019-02-01: 750 mg via INTRAVENOUS
  Filled 2019-01-31: qty 150

## 2019-01-31 MED ORDER — VANCOMYCIN HCL 10 G IV SOLR
1250.0000 mg | Freq: Once | INTRAVENOUS | Status: AC
  Administered 2019-01-31: 13:00:00 1250 mg via INTRAVENOUS
  Filled 2019-01-31: qty 1250

## 2019-01-31 NOTE — Progress Notes (Addendum)
ANTICOAGULATION CONSULT NOTE  Pharmacy Consult for warfarin Indication: atrial fibrillation and mechanical MVR  Vital Signs: BP: 119/63 (09/23 0832) Pulse Rate: 97 (09/23 0756)  Labs: Recent Labs    01/29/19 0527 01/29/19 1635 01/29/19 1835 01/30/19 0327 01/31/19 0328 01/31/19 0917  HGB 11.2*  --   --  10.2*  --  11.4*  HCT 34.7*  --   --  31.4*  --  33.6*  PLT 96*  --   --  109*  --  124*  LABPROT 56.6*  --   --  64.0* 28.0*  --   INR 6.6*  --   --  7.7* 2.7*  --   CREATININE 1.78* 1.63*  --  1.72*  --  1.56*  TROPONINIHS  --  791* 718*  --   --   --     Estimated Creatinine Clearance: 32 mL/min (A) (by C-G formula based on SCr of 1.56 mg/dL (H)).  Assessment: 68 y.o. female admitted with CHF/SOB, h/o Afib and mechanical MVR, to continue warfarin. PTA regimen is 4 mg daily. INR has been elevated likely due to vitamin K deficiency. Last dose of warfarin was on 9/19 -INR= 2.7 (down from 7.7 after 1mg  IV vitamin K on 9/22)  Outpatient INR goal noted to be 2-3. And the goal was changed to 2.5-3.5 this admission   Goal of Therapy:  INR 2.5-3.5 Monitor platelets by anticoagulation protocol: Yes   Plan:  -Coumadin 4mg  po today -Daily INR -Will need to watch INR closely (may need IV heparin based on INR trend)   Thank you for involving pharmacy in this patient's care.  **Pharmacist phone directory can be found on [email or web address] listed under [place]**

## 2019-01-31 NOTE — Progress Notes (Signed)
ANTICOAGULATION CONSULT NOTE - Follow Up Consult  Pharmacy Consult for Heparin Indication: Anticoag:  mech MVR + afib   Allergies  Allergen Reactions  . No Known Allergies     Patient Measurements: Height: 5\' 2"  (157.5 cm) Weight: 153 lb 4.8 oz (69.5 kg) IBW/kg (Calculated) : 50.1 Heparin Dosing Weight: 65 kg  Vital Signs: Temp: 100 F (37.8 C) (09/23 1522) Temp Source: Oral (09/23 1522) BP: 119/66 (09/23 1522) Pulse Rate: 96 (09/23 1522)  Labs: Recent Labs    01/29/19 0527 01/29/19 1635 01/29/19 1835 01/30/19 0327 01/31/19 0328 01/31/19 0917 01/31/19 1927  HGB 11.2*  --   --  10.2*  --  11.4*  --   HCT 34.7*  --   --  31.4*  --  33.6*  --   PLT 96*  --   --  109*  --  124*  --   LABPROT 56.6*  --   --  64.0* 28.0*  --  23.3*  INR 6.6*  --   --  7.7* 2.7*  --  2.1*  CREATININE 1.78* 1.63*  --  1.72*  --  1.56*  --   TROPONINIHS  --  791* 718*  --   --   --   --     Estimated Creatinine Clearance: 32 mL/min (A) (by C-G formula based on SCr of 1.56 mg/dL (H)).   Assessment: Anticoag:  Warfarin for hx mech MVR + afib (PTA 4mg  daily) INR 4.7>>6.6>7.7 (1mg  IV vit K)>>2.7>2.1, Hgb 11.4, plts 124  Goal of Therapy:  Heparin level 0.3-0.7 units/ml  INR 2.5-3.5 Monitor platelets by anticoagulation protocol: Yes   Plan:  - Start IV heparin bridge for INR<2.5, no bolus, at 900 units/hr - Daily HL and CBC   Chriselda Leppert S. Alford Highland, PharmD, BCPS Clinical Staff Pharmacist Eilene Ghazi Stillinger 01/31/2019,8:15 PM

## 2019-01-31 NOTE — Progress Notes (Addendum)
Pharmacy Antibiotic Note  Marie Malone is a 68 y.o. female  with cellulitis.  Pharmacy has been consulted for vancomycin dosing (she is currently on rocephin day 5). -WBC= 18.7. tmax= 101, CrCl ~ 30, cultures neg  Plan: -Vancomycin 1250mg  IV x1 followed by 750mg  IV q24h (estimatd AUC= 530) -Will follow renal function, and clinical progress    Height: 5\' 2"  (157.5 cm) Weight: 153 lb 4.8 oz (69.5 kg) IBW/kg (Calculated) : 50.1  No data recorded.  Recent Labs  Lab 01/27/19 0421 01/28/19 0441 01/28/19 1551 01/29/19 0527 01/29/19 1635 01/30/19 0327 01/31/19 0917  WBC 10.8* 11.4*  --  13.2*  --  16.1* 18.7*  CREATININE 1.54* 1.64* 1.51* 1.78* 1.63* 1.72* 1.56*    Estimated Creatinine Clearance: 32 mL/min (A) (by C-G formula based on SCr of 1.56 mg/dL (H)).    Allergies  Allergen Reactions  . No Known Allergies     Antimicrobials this admission: 9/23 vanc>> 9/19 CTX>>  Dose adjustments this admission:   Microbiology results: 9/18 BCx:- neg 9/18 UCx: - multiple 9/18 C diff neg 9/20 urine- insignif growth  Thank you for allowing pharmacy to be a part of this patient's care.  Hildred Laser, PharmD Clinical Pharmacist **Pharmacist phone directory can now be found on [email or web address] ([email or web address]).  Listed under [place].

## 2019-01-31 NOTE — Progress Notes (Signed)
Progress Note  Patient Name: Marie Malone Date of Encounter: 01/31/2019  Primary Cardiologist: [name], MD   Subjective   Somnolent. Speaking very slowly. No complaints of chest pain, SOB, or palpitations. She has LLE pain and swelling. She reports 2 episodes of loose stools yesterday. No complaints of bleeding.   Inpatient Medications    Scheduled Meds:  aspirin EC  81 mg Oral Daily   atorvastatin  40 mg Oral q1800   feeding supplement (ENSURE ENLIVE)  237 mL Oral BID BM   metoprolol succinate  12.5 mg Oral Daily   ursodiol  300 mg Oral BID   Continuous Infusions:  cefTRIAXone (ROCEPHIN)  IV 2 g (01/31/19 8242)   sodium bicarbonate 150 mEq in dextrose 5% 1000 mL 150 mEq (01/30/19 1947)   PRN Meds: acetaminophen **OR** acetaminophen, ondansetron **OR** ondansetron (ZOFRAN) IV   Vital Signs    Vitals:   01/30/19 2256 01/31/19 0030 01/31/19 0456 01/31/19 0556  BP: 119/61  108/62 113/62  Pulse: 100 (!) 101 (!) 101 96  Resp: (!) 31 (!) 36 (!) 34 (!) 31  Temp:      TempSrc:      SpO2: 96% 95% 96% 96%  Weight:   69.5 kg   Height:        Intake/Output Summary (Last 24 hours) at 01/31/2019 0909 Last data filed at 01/31/2019 0600 Gross per 24 hour  Intake 865.45 ml  Output 100 ml  Net 765.45 ml   Filed Weights   01/28/19 0412 01/29/19 0508 01/31/19 0456  Weight: 68.2 kg 64.6 kg 69.5 kg    Telemetry    Atrial flutter with 2:1 AV block with CVR - Personally Reviewed   Physical Exam   GEN: Somnolent. No acute distress.   Neck: No JVD, no carotid bruits Cardiac: RRR, + mechanical valve click, no rubs or gallops.  Respiratory: Clear to auscultation bilaterally, no wheezes/ rales/ rhonchi GI: NABS, Soft, nontender, non-distended  MS: LLE edema, pain, and increased warmth; No deformity. Neuro:  Nonfocal, moving all extremities spontaneously Psych: Normal affect   Labs    Chemistry Recent Labs  Lab 01/28/19 0441  01/29/19 0527 01/29/19 1635  01/30/19 0327  NA 135   < > 136 136 135  K 4.3   < > 4.1 4.1 3.8  CL 108   < > 106 107 106  CO2 17*   < > 19* 18* 17*  GLUCOSE 92   < > 105* 121* 94  BUN 55*   < > 60* 59* 60*  CREATININE 1.64*   < > 1.78* 1.63* 1.72*  CALCIUM 8.4*   < > 8.5* 8.3* 8.4*  PROT 8.1  --  7.8  --  8.1  ALBUMIN 2.3*  --  2.2*  --  2.8*  AST 32  --  38  --  34  ALT 16  --  14  --  13  ALKPHOS 65  --  66  --  75  BILITOT 2.0*  --  1.6*  --  1.5*  GFRNONAA 32*   < > 29* 32* 30*  GFRAA 37*   < > 34* 37* 35*  ANIONGAP 10   < > 11 11 12    < > = values in this interval not displayed.     Hematology Recent Labs  Lab 01/28/19 0441 01/29/19 0527 01/30/19 0327  WBC 11.4* 13.2* 16.1*  RBC 4.02 3.92 3.60*  HGB 11.4* 11.2* 10.2*  HCT 36.1 34.7* 31.4*  MCV 89.8 88.5 87.2  MCH 28.4 28.6 28.3  MCHC 31.6 32.3 32.5  RDW 16.4* 16.3* 16.1*  PLT 79* 96* 109*    Cardiac EnzymesNo results for input(s): TROPONINI in the last 168 hours. No results for input(s): TROPIPOC in the last 168 hours.   BNPNo results for input(s): BNP, PROBNP in the last 168 hours.   DDimer No results for input(s): DDIMER in the last 168 hours.   Radiology    Ct Extremity Lower Left Wo Contrast  Result Date: 01/30/2019 CLINICAL DATA:  Fullness and pain, compartment syndrome, supratherapeutic INR, rule out hemorrhage EXAM: CT OF THE LOWER LEFT EXTREMITY WITHOUT CONTRAST TECHNIQUE: Multidetector CT imaging of the lower left extremity was performed according to the standard protocol. COMPARISON:  07/17/2013 CT pelvis FINDINGS: Bones/Joint/Cartilage Hip and proximal femur not included. Mild tricompartmental degenerative spurring in the knee with small low-attenuation effusion. No fracture, dislocation, or worrisome bone lesions. The mid and forefoot are not included. Ligaments Suboptimally assessed by CT. Muscles and Tendons No intramuscular hematoma or mass. Soft tissues There is marked skin thickening and subcutaneous edema. No discrete  hematoma or other fluid collection. Scattered vascular calcifications in popliteal and tibial vessels. IMPRESSION: 1. Negative for hematoma or other acute finding. 2. 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 Electronically Signed   By: Lucrezia Europe M.D.   On: 01/30/2019 18:35    Cardiac Studies   Echocardiogram 01/24/2019: Impressions: 1. Left ventricular ejection fraction, by visual estimation, is 45 to 50%. The left ventricle has mildly decreased function. Normal left ventricular size. Left ventricular septal wall thickness was normal. Normal left ventricular posterior wall  thickness. There is no left ventricular hypertrophy. 2. Indeterminate diastolic function due to mechanical mitral valve pattern of LV diastolic filling. 3. Right ventricular pressure overload and right ventricular volume overload. 4. Global right ventricle has moderately reduced systolic function.The right ventricular size is moderately enlarged. No increase in right ventricular wall thickness. 5. Left atrial size was moderately dilated. 6. Right atrial size was severely dilated. 7. The mitral valve has been repaired/replaced. No evidence of mitral valve regurgitation. No evidence of mitral stenosis. 8. The tricuspid valve is normal in structure. Tricuspid valve regurgitation mild-moderate. 9. The aortic valve The aortic valve is normal in structure. Aortic valve regurgitation was not visualized by color flow Doppler. Structurally normal aortic valve, with no evidence of sclerosis or stenosis. 10. The pulmonic valve was normal in structure. Pulmonic valve regurgitation is mild by color flow Doppler. 11. Moderately elevated pulmonary artery systolic pressure. 12. The inferior vena cava is dilated in size with <50% respiratory variability, suggesting right atrial pressure of 15 mmHg. _______________  Lower Extremity Vascular Ultrasound 01/25/2019 (Preliminary Read): Summary: Right: There is no evidence  of deep vein thrombosis in the lower extremity. However, portions of this examination were limited- see technologist comments above. Left: There is no evidence of deep vein thrombosis in the lower extremity. However, portions of this examination were limited- see technologist comments above. Significant fluid noted in the posterior calf, etiology unknown.   Patient Profile     Marie Malone is a 68 y.o. female with a history of rheumatic fever/mitral valve stenosis s/p valvuloplasty at [facility] in 2008, CAD s/p CABG, mechanical MVR, and MAZE procedure in 05/2016, paroxysmal atrial fibrillation on Coumadin, PAD s/p endarterectomy in 03/2016 who was admitted on 01/24/2019 for acute on chronic CHF. Found to be in atrial fibrillation on presentation to the ED but spontaneous converted back to normal  sinus rhythm. However, then on 01/28/2019 he was noted to be in atrial flutter.  Assessment & Plan    1. Paroxysmal atrial flutter with hx of paroxysmal atrial fibrillation s/p MAZE in 2018: appears to still be in atrial flutter with 2:1 AV block with controlled ventricular rates. INR improved to 2.7, down from 7.7 after receiving Vitamin K yesterday.  - Could consider DCCV prior to discharge once acute illness improves.  - Continue coumadin per pharmacy for stroke ppx. If INR dips below therapeutic window, would then have to consider TEE/DCCV - Continue metoprolol succinate for rate control  2. Acute on chronic combined CHF: patient presented with SOB. BNP elevated to 1600s. CXR with pulmonary edema and small right pleural effusion. Echo with EF 45-50% with right ventricle dysfunction. Initially diuresed with IV lasix, however this was stopped 01/26/2019 due to diarrhea, poor po intake, and elevated Cr. Volume status appears stable - Continue to monitor strict I&Os and daily weights - Continue metoprolol succinate  3. Chest pain in patient with CAD s/p CABG in 2018: HsT 791>718 on labs yesterday, up from 111>113  on admission. Patient did not recall complaints of chest pain. EKG was without changes from previous with chronic ST-T wave abnormalities. Reported to have TTP of chest wall - Continue to monitor for recurrent chest pain  4. Mechanical MVR: hx rheumatic fever. S/p valvuloplasty at [facility] in 2008, then mechanical MVR at the time of CABG and MAZE in 2018. INR improved to 2.7 after Vitamin K  Yesterday, up to 7.7 this admission.  - Continue coumadin per pharmacy  5. Possible OSA: desaturations noted overnight - Consider sleep study outpatient  6. AKI: Cr peaked at 1.78, minimally improved to 1.72 on labs yesterday. No BMET today - Continue to monitor   7. LLE cellulitis: On antibiotics. Possibly complicated by hypoperfusion. Primary team plans to consult vascular surgery today. - Continue management per primary team    For questions or updates, please contact [place] Please consult [email or web address] for contact info under Cardiology/STEMI.      Signed, Abigail Butts, PA-C  01/31/2019, 9:09 AM   [phone]

## 2019-01-31 NOTE — Progress Notes (Signed)
PROGRESS NOTE    Marie Malone  UDJ:[id number] DOB: 05/15/1950 DOA: 01/27/2019 PCP: Debbrah Alar, NP   Brief Narrative:  Marie Dedmon Lipfordis a 68 y.o. 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 Patient states over the last 2 weeks patient has been getting progressively short of breath on exertion. Denies any chest pain fever chills or productive cough. Patient's primary care physician advised her to increase her Lasix twice daily which she is supposed to start today. Because of worsening shortness of breath patient came to the ER. EMS was called and patient initially was placed on BiPAP. Patient also had complained of some left calf cramping.  ED Course:In the ER patient was hypoxic placed on BiPAP chest x-ray shows features consistent with CHF. Lab work show a BNP of 1699, sodium 139 potassium 4.2 creatinine 1.1 WBC count of 7.6 hemoglobin 12.6 platelets 82 and EKG was showing A. fib rate at 106 bpm. Patient was given 60 mg IV Lasix following which patient was able to be weaned off BiPAP and presently is on 5 L oxygen. Patient admitted for further management of acute CHF. COVID-19 test was negative. On exam patient has good peripheral pulses with some edema in the lower extremity.  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   S/P CABG x 1   Acute on chronic systolic CHF (congestive heart failure) (HCC)   Atrial fibrillation, chronic   Cellulitis of left leg   Acute renal failure (HCC)   Primary biliary cirrhosis (HCC)   Chest pain at rest   H/O mitral valve replacement with  mechanical valve   Atypical atrial flutter (HCC)  Acute respiratory failure with hypoxia -Not on home O2 -Currently on room air  Chest pain acute started 9/21 - Patient borderline hypotensive with MAP 60-63 - Obtain 12-lead EKG; atrial flutter, nonspecific ST-T wave changes no changes from previous EKG - Troponin high-sensitivity 791 -> 718 - Albumin 50 g - Normal saline bolus 551ml - BMP, Mg WNL - BP improved, after albumin and normal saline bolus.  Acute on chronic systolic CHF -Strict in and out +3.1 L (inaccurate) -Daily weight           01/29/19 0508     64.6 kg  - EF 40 to 45% see echocardiogram results below - Per cardiology discontinue Lasix, ARB, and K-Dur -9/20 decreased metoprolol XR 12.5 mg daily (patient's BP on the hypotensive side) - Cardiology c/s - appreciate recs - recommending hold diuretics with poor oral intake and acute on chronic CKD -  Consider DCCV prior to d/c (vs TEE/DCCV if pt INR becomes subtherapeutic)  Mechanical mitral valve - Continue warfarin, monitor INR - down to 2.7 today -> repeat this PM and start heparin if falls below 2.5 (discussed with pharmacy)     -Therapeutic INR goal 2.5-3.5 -9/20 supratherapeutic INR most likely secondary to addition of antibiotic, resume Coumadin today (9/23 PM) - s/p reversal for rising INR on 9/22  Atrial fibrillation chronic - Currently NSR - Anticoagulated for her mechanical mitral valve  LLE cellulitis vs SIRS - Blood culture, urine culture NGTD  - leukocytosis is worsening -  follow procalcitonin - she hasn't improved her LLE cellulitis on ceftriaxone for several days - Patient also with loose stools (possibly from Ursodiol) however patient has been chronically on this medication. - lactoferrin negative and c diff negative - pending GI path panel, fecal fat - continue ceftriaxone for cellulitis, will add on vancomycin given lack of improvement - See CHF and PBS   LLE Swelling - some  concern for posterior knee bleed vs compartment syndrome yesterday - CT was not suggestive of hematoma, but did have marked skin thickening and subcutaneous edema - 9/21 L LE cellulitis slightly worse today, ? secondary to hypoperfusion with low BP -9/22 LLE fullness and pain increasing obtain CT LLE, given patient's supratherapeutic INR evaluate for bleeding and to left posterior knee joint and calf - LLE [place] from 9/22 read an notable for "significant fluid in posterior calf, unclear etiology" - discussed this with vascular to ensure this did not appear to be fluid collection and he noted appeared c/w edema - pt with good distal pulses, will hold off on vascular c/s  Acute renal failure (baseline Cr 1.0)  NAGMA Last Labs          Recent Labs  Lab 01/28/19 0441 01/28/19 1551 01/29/19 0527 01/29/19 1635 01/30/19 0327  CREATININE 1.64* 1.51* 1.78* 1.63* 1.72*    - creatinine improved today - Most likely secondary to overdiuresis and diarrhea. - 9/20 obtain urine lites; calculate FeNa - Hold IVF for now - bicarb 17 today, will try oral - ABG pending  Primary biliary cirrhosis(PBC) - 9/22 restart Ursodial 300 mg BID.   - pt c/o itching, possibly related to cellulitis?  Diarrhea - 9/22 patient reports diarrhea to cardiology, though has denied diarrhea to me on several occasions. -Fecal fat pending -GI pathogen panel by PCR pending -Lactoferrin ending -Stool osmolality pending -Stool K/NA pending - See PBC.  Goals of care - On 9/21 place PT/OT request patient was able to ambulate independently at home prior to this hospitalization.  Due to the severity of the LLE cellulitis unsure patient will be able to discharge home.  DVT prophylaxis: warfarin, may need heparin bridge (INR goal 2.5-3.5) Code Status: full  Family Communication: none at bedside - sister over phone Disposition Plan: pending further improvement  Consultants:   cardiology  Procedures:  LE [place] Summary:  Right: There is no evidence of deep vein thrombosis in the lower extremity. However, portions of this examination were limited- see technologist comments above. Left: There is no evidence of deep vein thrombosis in the lower extremity. However, portions of this examination were limited- see technologist comments above. Significant fluid noted in the posterior calf, etiology unknown.   *See table(s) above for measurements and observations.  Electronically signed by Deitra Mayo MD on 01/30/2019 at 9:32:51 AM.  Echo IMPRESSIONS    1. Left ventricular ejection fraction, by visual estimation, is 45 to 50%. The left ventricle has mildly decreased function. Normal left ventricular size. Left ventricular septal wall thickness was normal. Normal left ventricular posterior wall  thickness. There is no left ventricular hypertrophy.  2. Indeterminate diastolic function due to mechanical mitral valve pattern of LV diastolic filling.  3. Right ventricular pressure overload and right ventricular volume overload.  4. Global right ventricle has moderately reduced systolic function.The right ventricular size is moderately enlarged. No increase in right ventricular wall thickness.  5. Left atrial size was moderately dilated.  6. Right atrial size was severely dilated.  7. The mitral valve has been repaired/replaced.  No evidence of mitral valve regurgitation. No evidence of mitral stenosis.  8. The tricuspid valve is normal in structure. Tricuspid valve regurgitation mild-moderate.  9. The aortic valve The aortic valve is normal in structure. Aortic valve regurgitation was not visualized by color flow Doppler. Structurally normal aortic valve, with no evidence of sclerosis or stenosis. 10. The pulmonic valve was normal in structure. Pulmonic valve regurgitation is mild by color flow Doppler. 11. Moderately elevated pulmonary artery systolic pressure. 12. The inferior vena cava is dilated in size with  <50% respiratory variability, suggesting right atrial pressure of 15 mmHg.  Antimicrobials:  Anti-infectives (From admission, onward)   Start     Dose/Rate Route Frequency Ordered Stop   02/01/19 1300  vancomycin (VANCOCIN) IVPB 750 mg/150 ml premix     750 mg 150 mL/hr over 60 Minutes Intravenous Every 24 hours 01/31/19 1152     01/31/19 1300  vancomycin (VANCOCIN) 1,250 mg in sodium chloride 0.9 % 250 mL IVPB     1,250 mg 166.7 mL/hr over 90 Minutes Intravenous  Once 01/31/19 1152 01/31/19 1448   01/27/19 0930  cefTRIAXone (ROCEPHIN) 2 g in sodium chloride 0.9 % 100 mL IVPB     2 g 200 mL/hr over 30 Minutes Intravenous Every 24 hours 01/27/19 0824        Subjective: C/o LLE pain and swelling Thinks this started sunday  Objective: Vitals:   01/31/19 0556 01/31/19 0756 01/31/19 0832 01/31/19 1522  BP: 113/62 128/85 119/63 119/66  Pulse: 96 97  96  Resp: (!) 31 (!) 27  (!) 33  Temp:    100 F (37.8 C)  TempSrc:    Oral  SpO2: 96% 98%  97%  Weight:      Height:        Intake/Output Summary (Last 24 hours) at 01/31/2019 1923 Last data filed at 01/31/2019 1800 Gross per 24 hour  Intake 1455.45 ml  Output -  Net 1455.45 ml   Filed Weights   01/28/19 0412 01/29/19 0508 01/31/19 0456  Weight: 68.2 kg 64.6 kg 69.5 kg    Examination:  General exam: Appears calm and comfortable  Respiratory system: Clear to auscultation. Respiratory effort normal. Cardiovascular system: S1 & S2 heard, RRR. Gastrointestinal system: Abdomen is nondistended, soft and nontender.  Central nervous system: Alert and oriented. No focal neurological deficits. Extremities: LLE edema and warmth.  Palpable DP pulse.  Serous drainage from calf from ruptured bullae. Skin: No rashes, lesions or ulcers Psychiatry: Judgement and insight appear normal. Mood & affect appropriate.     Data Reviewed: I have personally reviewed following labs and imaging studies  CBC: Recent Labs  Lab 01/25/19 0217   01/27/19 0421 01/28/19 0441 01/29/19 0527 01/30/19 0327 01/31/19 0917  WBC 9.0   < > 10.8* 11.4* 13.2* 16.1* 18.7*  NEUTROABS 7.6  --   --   --   --   --   --   HGB 10.7*   < > 11.9* 11.4* 11.2* 10.2* 11.4*  HCT 34.0*   < > 37.7 36.1 34.7* 31.4* 33.6*  MCV 91.6   < > 90.4 89.8 88.5 87.2 85.7  PLT 64*   < > 70* 79* 96* 109* 124*   < > = values in this interval not displayed.   Basic Metabolic Panel: Recent Labs  Lab 01/27/19 0421 01/28/19 0441 01/28/19 1551 01/29/19 0527 01/29/19 1635 01/30/19 0327 01/31/19 0917  NA 136 135 136 136 136 135 134*  K 4.6 4.3 4.5  4.1 4.1 3.8 4.0  CL 106 108 108 106 107 106 105  CO2 18* 17* 17* 19* 18* 17* 17*  GLUCOSE 76 92 103* 105* 121* 94 131*  BUN 52* 55* 56* 60* 59* 60* 60*  CREATININE 1.54* 1.64* 1.51* 1.78* 1.63* 1.72* 1.56*  CALCIUM 8.7* 8.4* 8.3* 8.5* 8.3* 8.4* 8.6*  MG 2.1 2.2  --  2.2 2.4 2.2  --    GFR: Estimated Creatinine Clearance: 32 mL/min (A) (by C-G formula based on SCr of 1.56 mg/dL (H)). Liver Function Tests: Recent Labs  Lab 01/27/19 0421 01/28/19 0441 01/29/19 0527 01/30/19 0327 01/31/19 0917  AST 31 32 38 34 38  ALT 15 16 14 13 14   ALKPHOS 69 65 66 75 89  BILITOT 2.0* 2.0* 1.6* 1.5* 1.5*  PROT 8.1 8.1 7.8 8.1 8.1  ALBUMIN 2.4* 2.3* 2.2* 2.8* 2.4*   No results for input(s): LIPASE, AMYLASE in the last 168 hours. No results for input(s): AMMONIA in the last 168 hours. Coagulation Profile: Recent Labs  Lab 01/27/19 0421 01/28/19 0441 01/29/19 0527 01/30/19 0327 01/31/19 0328  INR 3.8* 4.7* 6.6* 7.7* 2.7*   Cardiac Enzymes: No results for input(s): CKTOTAL, CKMB, CKMBINDEX, TROPONINI in the last 168 hours. BNP (last 3 results) No results for input(s): PROBNP in the last 8760 hours. HbA1C: No results for input(s): HGBA1C in the last 72 hours. CBG: No results for input(s): GLUCAP in the last 168 hours. Lipid Profile: No results for input(s): CHOL, HDL, LDLCALC, TRIG, CHOLHDL, LDLDIRECT in the last  72 hours. Thyroid Function Tests: No results for input(s): TSH, T4TOTAL, FREET4, T3FREE, THYROIDAB in the last 72 hours. Anemia Panel: No results for input(s): VITAMINB12, FOLATE, FERRITIN, TIBC, IRON, RETICCTPCT in the last 72 hours. Sepsis Labs: No results for input(s): PROCALCITON, LATICACIDVEN in the last 168 hours.  Recent Results (from the past 240 hour(s))  SARS Coronavirus 2 [facility] order, Performed in [facility] hospital lab) Nasopharyngeal Nasopharyngeal Swab     Status: None   Collection Time: 01/22/2019 10:12 PM   Specimen: Nasopharyngeal Swab  Result Value Ref Range Status   SARS Coronavirus 2 NEGATIVE NEGATIVE Final    Comment: (NOTE) If result is NEGATIVE SARS-CoV-2 target nucleic acids are NOT DETECTED. The SARS-CoV-2 RNA is generally detectable in upper and lower  respiratory specimens during the acute phase of infection. The lowest  concentration of SARS-CoV-2 viral copies this assay can detect is 250  copies / mL. A negative result does not preclude SARS-CoV-2 infection  and should not be used as the sole basis for treatment or other  patient management decisions.  A negative result may occur with  improper specimen collection / handling, submission of specimen other  than nasopharyngeal swab, presence of viral mutation(s) within the  areas targeted by this assay, and inadequate number of viral copies  (<250 copies / mL). A negative result must be combined with clinical  observations, patient history, and epidemiological information. If result is POSITIVE SARS-CoV-2 target nucleic acids are DETECTED. The SARS-CoV-2 RNA is generally detectable in upper and lower  respiratory specimens dur ing the acute phase of infection.  Positive  results are indicative of active infection with SARS-CoV-2.  Clinical  correlation with patient history and other diagnostic information is  necessary to determine patient infection status.  Positive results do  not rule out  bacterial infection or co-infection with other viruses. If result is PRESUMPTIVE POSTIVE SARS-CoV-2 nucleic acids MAY BE PRESENT.   A presumptive positive result was  obtained on the submitted specimen  and confirmed on repeat testing.  While 2019 novel coronavirus  (SARS-CoV-2) nucleic acids may be present in the submitted sample  additional confirmatory testing may be necessary for epidemiological  and / or clinical management purposes  to differentiate between  SARS-CoV-2 and other Sarbecovirus currently known to infect humans.  If clinically indicated additional testing with an alternate test  methodology [phone]) is advised. The SARS-CoV-2 RNA is generally  detectable in upper and lower respiratory sp ecimens during the acute  phase of infection. The expected result is Negative. Fact Sheet for Patients:  StrictlyIdeas.no Fact Sheet for Healthcare Providers: BankingDealers.co.za This test is not yet approved or cleared by the [place] FDA and has been authorized for detection and/or diagnosis of SARS-CoV-2 by FDA under an Emergency Use Authorization (EUA).  This EUA will remain in effect (meaning this test can be used) for the duration of the COVID-19 declaration under Section 564(b)(1) of the Act, 21 U.S.C. section 360bbb-3(b)(1), unless the authorization is terminated or revoked sooner. Performed at [facility], [place] [address], [place], [place] [address]   Culture, Urine     Status: None   Collection Time: 01/25/19  1:20 AM   Specimen: Urine, Catheterized  Result Value Ref Range Status   Specimen Description URINE, CATHETERIZED  Final   Special Requests   Final    NONE Performed at [facility], [place] [address], [place], [place] [address]    Culture   Final    Multiple bacterial morphotypes present, none predominant. Suggest appropriate recollection if clinically indicated.   Report Status 01/26/2019 FINAL   Final  Culture, blood (routine x 2)     Status: None   Collection Time: 01/25/19  2:16 AM   Specimen: BLOOD RIGHT HAND  Result Value Ref Range Status   Specimen Description BLOOD RIGHT HAND  Final   Special Requests   Final    BOTTLES DRAWN AEROBIC AND ANAEROBIC Blood Culture adequate volume   Culture   Final    NO GROWTH 5 DAYS Performed at [facility], [address] [place], [place] [address]    Report Status 01/30/2019 FINAL  Final  Culture, blood (routine x 2)     Status: None   Collection Time: 01/25/19  2:16 AM   Specimen: BLOOD LEFT HAND  Result Value Ref Range Status   Specimen Description BLOOD LEFT HAND  Final   Special Requests   Final    BOTTLES DRAWN AEROBIC AND ANAEROBIC Blood Culture adequate volume   Culture   Final    NO GROWTH 5 DAYS Performed at [facility], [place] [address], [place], [place] [address]    Report Status 01/30/2019 FINAL  Final  C difficile quick scan w PCR reflex     Status: None   Collection Time: 01/26/19  6:33 PM   Specimen: STOOL  Result Value Ref Range Status   C Diff antigen NEGATIVE NEGATIVE Final   C Diff toxin NEGATIVE NEGATIVE Final   C Diff interpretation No C. difficile detected.  Final    Comment: Performed at [facility], [place] [address] [place], [place] [address]  Culture, blood (routine x 2)     Status: None   Collection Time: 01/26/19  8:10 PM   Specimen: BLOOD  Result Value Ref Range Status   Specimen Description BLOOD BLOOD RIGHT FOREARM  Final   Special Requests   Final  BOTTLES DRAWN AEROBIC AND ANAEROBIC Blood Culture adequate volume   Culture   Final    NO GROWTH 5 DAYS Performed at [facility], [place] [address] [place], [place] [address]    Report Status 01/31/2019 FINAL  Final  Culture, blood (routine x 2)     Status: None   Collection Time: 01/26/19  8:25 PM   Specimen: BLOOD LEFT HAND  Result Value Ref Range Status   Specimen Description BLOOD LEFT HAND  Final   Special  Requests   Final    BOTTLES DRAWN AEROBIC ONLY Blood Culture adequate volume   Culture   Final    NO GROWTH 5 DAYS Performed at [facility], [place] [address] [place], [place] [address]    Report Status 01/31/2019 FINAL  Final  Culture, Urine     Status: Abnormal   Collection Time: 01/28/19  6:40 PM   Specimen: Urine, Random  Result Value Ref Range Status   Specimen Description URINE, RANDOM  Final   Special Requests NONE  Final   Culture (A)  Final    <10,000 COLONIES/mL INSIGNIFICANT GROWTH Performed at [facility], [place] [address] [place], [place] [address]    Report Status 01/29/2019 FINAL  Final         Radiology Studies: Ct Extremity Lower Left Wo Contrast  Result Date: 01/30/2019 CLINICAL DATA:  Fullness and pain, compartment syndrome, supratherapeutic INR, rule out hemorrhage EXAM: CT OF THE LOWER LEFT EXTREMITY WITHOUT CONTRAST TECHNIQUE: Multidetector CT imaging of the lower left extremity was performed according to the standard protocol. COMPARISON:  07/17/2013 CT pelvis FINDINGS: Bones/Joint/Cartilage Hip and proximal femur not included. Mild tricompartmental degenerative spurring in the knee with small low-attenuation effusion. No fracture, dislocation, or worrisome bone lesions. The mid and forefoot are not included. Ligaments Suboptimally assessed by CT. Muscles and Tendons No intramuscular hematoma or mass. Soft tissues There is marked skin thickening and subcutaneous edema. No discrete hematoma or other fluid collection. Scattered vascular calcifications in popliteal and tibial vessels. IMPRESSION: 1. Negative for hematoma or other acute finding. 2. 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 Electronically Signed   By: Lucrezia Europe M.D.   On: 01/30/2019 18:35        Scheduled Meds: . aspirin EC  81 mg Oral Daily  . atorvastatin  40 mg Oral q1800  . feeding supplement (ENSURE ENLIVE)  237 mL Oral BID BM  . metoprolol succinate  12.5 mg Oral  Daily  . ursodiol  300 mg Oral BID  . warfarin  4 mg Oral Once  . [START ON 02/01/2019] Warfarin - Pharmacist Dosing Inpatient   Does not apply q1800   Continuous Infusions: . cefTRIAXone (ROCEPHIN)  IV Stopped (01/31/19 6503)  . sodium bicarbonate 150 mEq in dextrose 5% 1000 mL 150 mEq (01/30/19 1947)  . [START ON 02/01/2019] vancomycin       LOS: 7 days    Time spent: over 30 min    Fayrene Helper, MD [facility] Pager AMION  If 7PM-7AM, please contact night-coverage [email or web address] Password [facility] 01/31/2019, 7:23 PM

## 2019-02-01 ENCOUNTER — Inpatient Hospital Stay (HOSPITAL_COMMUNITY): Payer: Medicare Other

## 2019-02-01 DIAGNOSIS — I2510 Atherosclerotic heart disease of native coronary artery without angina pectoris: Secondary | ICD-10-CM

## 2019-02-01 DIAGNOSIS — Z952 Presence of prosthetic heart valve: Secondary | ICD-10-CM

## 2019-02-01 DIAGNOSIS — Z8673 Personal history of transient ischemic attack (TIA), and cerebral infarction without residual deficits: Secondary | ICD-10-CM

## 2019-02-01 DIAGNOSIS — I4891 Unspecified atrial fibrillation: Secondary | ICD-10-CM

## 2019-02-01 DIAGNOSIS — Z87891 Personal history of nicotine dependence: Secondary | ICD-10-CM

## 2019-02-01 DIAGNOSIS — L03116 Cellulitis of left lower limb: Secondary | ICD-10-CM

## 2019-02-01 DIAGNOSIS — G4733 Obstructive sleep apnea (adult) (pediatric): Secondary | ICD-10-CM

## 2019-02-01 LAB — MAGNESIUM: Magnesium: 2.4 mg/dL (ref 1.7–2.4)

## 2019-02-01 LAB — CBC
HCT: 32.7 % — ABNORMAL LOW (ref 36.0–46.0)
Hemoglobin: 10.9 g/dL — ABNORMAL LOW (ref 12.0–15.0)
MCH: 28.2 pg (ref 26.0–34.0)
MCHC: 33.3 g/dL (ref 30.0–36.0)
MCV: 84.7 fL (ref 80.0–100.0)
Platelets: 140 10*3/uL — ABNORMAL LOW (ref 150–400)
RBC: 3.86 MIL/uL — ABNORMAL LOW (ref 3.87–5.11)
RDW: 16 % — ABNORMAL HIGH (ref 11.5–15.5)
WBC: 23.1 10*3/uL — ABNORMAL HIGH (ref 4.0–10.5)
nRBC: 0.1 % (ref 0.0–0.2)

## 2019-02-01 LAB — HEPARIN LEVEL (UNFRACTIONATED): Heparin Unfractionated: <0.1 IU/mL — ABNORMAL LOW (ref 0.30–0.70)

## 2019-02-01 LAB — COMPREHENSIVE METABOLIC PANEL
ALT: 13 U/L (ref 0–44)
AST: 34 U/L (ref 15–41)
Albumin: 2.2 g/dL — ABNORMAL LOW (ref 3.5–5.0)
Alkaline Phosphatase: 90 U/L (ref 38–126)
Anion gap: 11 (ref 5–15)
BUN: 59 mg/dL — ABNORMAL HIGH (ref 8–23)
CO2: 20 mmol/L — ABNORMAL LOW (ref 22–32)
Calcium: 8.4 mg/dL — ABNORMAL LOW (ref 8.9–10.3)
Chloride: 107 mmol/L (ref 98–111)
Creatinine, Ser: 1.48 mg/dL — ABNORMAL HIGH (ref 0.44–1.00)
GFR calc Af Amer: 42 mL/min — ABNORMAL LOW (ref >60)
GFR calc non Af Amer: 36 mL/min — ABNORMAL LOW (ref >60)
Glucose, Bld: 120 mg/dL — ABNORMAL HIGH (ref 70–99)
Potassium: 3.6 mmol/L (ref 3.5–5.1)
Sodium: 138 mmol/L (ref 135–145)
Total Bilirubin: 1.8 mg/dL — ABNORMAL HIGH (ref 0.3–1.2)
Total Protein: 7.3 g/dL (ref 6.5–8.1)

## 2019-02-01 LAB — PROTIME-INR
INR: 2.5 — ABNORMAL HIGH (ref 0.8–1.2)
Prothrombin Time: 26.8 seconds — ABNORMAL HIGH (ref 11.4–15.2)

## 2019-02-01 LAB — PROCALCITONIN: Procalcitonin: 2.37 ng/mL

## 2019-02-01 MED ORDER — CEFAZOLIN SODIUM-DEXTROSE 2-4 GM/100ML-% IV SOLN
2.0000 g | Freq: Three times a day (TID) | INTRAVENOUS | Status: DC
  Administered 2019-02-01 – 2019-02-04 (×8): 2 g via INTRAVENOUS
  Filled 2019-02-01 (×9): qty 100

## 2019-02-01 MED ORDER — OXYCODONE HCL 5 MG PO TABS
5.0000 mg | ORAL_TABLET | ORAL | Status: DC | PRN
  Administered 2019-02-01 – 2019-02-03 (×4): 5 mg via ORAL
  Filled 2019-02-01 (×4): qty 1

## 2019-02-01 MED ORDER — FUROSEMIDE 40 MG PO TABS
40.0000 mg | ORAL_TABLET | Freq: Every day | ORAL | Status: DC
  Administered 2019-02-02 – 2019-02-04 (×3): 40 mg via ORAL
  Filled 2019-02-01 (×3): qty 1

## 2019-02-01 MED ORDER — WARFARIN SODIUM 4 MG PO TABS
4.0000 mg | ORAL_TABLET | Freq: Once | ORAL | Status: AC
  Administered 2019-02-01: 4 mg via ORAL
  Filled 2019-02-01: qty 1

## 2019-02-01 NOTE — Consult Note (Signed)
[facility] for Infectious Disease    Date of Admission:  01/17/2019     Total days of antibiotics 6               Reason for Consult: Cellulitis   Referring Provider: Florene Glen Primary Care Provider: Debbrah Alar, NP   ASSESSMENT:  Ms. Patteson is a 68 y/o female with acute respiratory failure with hypoxia related to acute on chronic heart failure complicated fever and cellulitis of the left lower extremity that is slowly improving with ceftriaxone and vancomycin added yesterday. Recommend elevation of the left lower leg and will narrow antibiotics to Cefazolin from Ceftriaxone as the most common organism is likely to be Streptococcus and no evidence of MRSA infection at present.    PLAN:  1. Discontinue vancomycin and ceftriaxone. 2. Start cefazolin.  3. Elevate left lower extremity to aid in decreasing edema. 4.   Diuresis per primary team.    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   S/P CABG x 1   Acute on chronic systolic CHF (congestive heart failure) (HCC)   Atrial fibrillation, chronic   Cellulitis of left leg   Acute renal failure (HCC)   Primary biliary cirrhosis (HCC)   Chest pain at rest   H/O mitral valve replacement with mechanical valve   Atypical atrial flutter (HCC)    aspirin EC  81 mg Oral Daily   atorvastatin  40 mg Oral q1800   feeding supplement (ENSURE ENLIVE)  237 mL Oral BID BM   [START ON 02/02/2019] furosemide  40 mg Oral Daily   metoprolol succinate  12.5 mg Oral Daily   sodium bicarbonate  650 mg Oral TID   ursodiol  300 mg Oral BID   warfarin  4 mg Oral ONCE-1800   Warfarin - Pharmacist Dosing Inpatient   Does not apply q1800     HPI: LAMOINE FREDRICKSEN is a 68 y.o. 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  Afebrile in the ED with hypoxia requiring BiPap. BNP 1699, WBC count of 7.6 and platelets of 82. EKG consistent with atrial fibrillation. Chest x-ray with findings favoring CHF/volume overload and interstitual pulmonary edema with right pleural effusion. Sars-CoV-2 testing negative. Admitted with acute respiratory failure with hypoxia secondary to acute on chronic systolic hear failure.   Ms. Gangl has had intermittent fevers since admission with most recent being 100.9 in the last 24 hours and increased leukocytosis with WBC count of 23.1. Initial blood and urine cultures have been without growth to date. On 9/18 tested for Clostridum Difficile due to diarrhea which was negative. GI pathogen panel is pending. Antimicrobial therapy was started with ceftriaxone for possible left lower extremity cellulitis on 9/19. Current on Day 6 of therapy with ceftriaxone with vancomycin added yesterday. Repeat blood cultures were obtained. ID has been consulted for antibiotic recommendations.   Review of Systems: Review of Systems  Constitutional: Negative for chills, fever and weight loss.  Respiratory: Negative for cough, shortness of breath and wheezing.   Cardiovascular: Negative for chest  pain and leg swelling.  Gastrointestinal: Negative for abdominal pain, constipation, diarrhea, nausea and vomiting.  Musculoskeletal:       Positive for left lower extremity pain and edema.   Skin: Negative for rash.     Past Medical History:  Diagnosis Date   Atrial fibrillation ([place])    Blood transfusion without reported diagnosis 05/2016   Chronic diastolic CHF (congestive heart failure)  ([place])    Clotting disorder ([place]) 2014   placed on Warfarin   Complication of anesthesia    difficult to wake up from anesthesia   Coronary artery disease involving native coronary artery without angina pectoris 03/26/2016   100% chronic occlusion of RCA   Dyspnea    Heart murmur    History of rheumatic fever    as a child   Hyperlipidemia    Hypertension    Mitral stenosis    OSA (obstructive sleep apnea)    mild per sleep study 2008   Paroxysmal atrial fibrillation (HCC)    Pneumonia    double pneumonia once   Pulmonary hypertension (HCC)    Rheumatic mitral regurgitation    S/P balloon mitral valvuloplasty 2008    S/P CABG x 1 05/28/2016   SVG to PDA with [facility] via right thigh   S/P mitral valve replacement with mechanical valve 05/28/2016   29 mm Sorin [employer]omed[employer] Optiform bileaflet mechanical prosthetic valve   Stroke ([place])    x2    Social History   Tobacco Use   Smoking status: Former Smoker    Packs/day: 1.00    Years: 5.00    Pack years: 5.00    Types: Cigarettes    Start date: 07/15/1970    Quit date: 05/11/1975    Years since quitting: 43.7   Smokeless tobacco: Never Used   Tobacco comment: quit smoking 36 years ago  Substance Use Topics   Alcohol use: No    Alcohol/week: 0.0 standard drinks   Drug use: No    Family History  Problem Relation Age of Onset   Diabetes Mother    Stroke Mother    Diabetes Father    Heart disease Father        CHF   Cancer Father        kidney   Diabetes Sister    Diabetes Brother    Hypertension Daughter    Colon polyps Neg Hx    Colon cancer Neg Hx    Esophageal cancer Neg Hx    Rectal cancer Neg Hx    Stomach cancer Neg Hx     Allergies  Allergen Reactions   No Known Allergies     OBJECTIVE: Blood pressure 121/60, pulse 91, temperature 99.1 F (37.3 C), temperature source Oral, resp. rate (!) 29, height 5\' 2"  (1.575 m), weight 69.5 kg, SpO2 99 %.  Physical  Exam Constitutional:      General: She is not in acute distress.    Appearance: She is well-developed.     Comments: Lying in bed with head of bed elevated; pleasant.   Cardiovascular:     Rate and Rhythm: Normal rate and regular rhythm.     Heart sounds: Normal heart sounds.  Pulmonary:     Effort: Pulmonary effort is normal.     Breath sounds: Normal breath sounds. No decreased breath sounds, wheezing, rhonchi or rales.  Musculoskeletal:     Comments: There is edema and redness of the left lower extremity and is tender to the touch and firm. Distal  pulses are present.   Skin:    General: Skin is warm and dry.  Neurological:     Mental Status: She is alert and oriented to person, place, and time.  Psychiatric:        Behavior: Behavior normal.        Thought Content: Thought content normal.        Judgment: Judgment normal.     Lab Results Lab Results  Component Value Date   WBC 23.1 (H) 02/01/2019   HGB 10.9 (L) 02/01/2019   HCT 32.7 (L) 02/01/2019   MCV 84.7 02/01/2019   PLT 140 (L) 02/01/2019    Lab Results  Component Value Date   CREATININE 1.48 (H) 02/01/2019   BUN 59 (H) 02/01/2019   NA 138 02/01/2019   K 3.6 02/01/2019   CL 107 02/01/2019   CO2 20 (L) 02/01/2019    Lab Results  Component Value Date   ALT 13 02/01/2019   AST 34 02/01/2019   ALKPHOS 90 02/01/2019   BILITOT 1.8 (H) 02/01/2019     Microbiology: Recent Results (from the past 240 hour(s))  SARS Coronavirus 2 [facility] order, Performed in [facility] hospital lab) Nasopharyngeal Nasopharyngeal Swab     Status: None   Collection Time: 01/25/2019 10:12 PM   Specimen: Nasopharyngeal Swab  Result Value Ref Range Status   SARS Coronavirus 2 NEGATIVE NEGATIVE Final    Comment: (NOTE) If result is NEGATIVE SARS-CoV-2 target nucleic acids are NOT DETECTED. The SARS-CoV-2 RNA is generally detectable in upper and lower  respiratory specimens during the acute phase of infection. The lowest   concentration of SARS-CoV-2 viral copies this assay can detect is 250  copies / mL. A negative result does not preclude SARS-CoV-2 infection  and should not be used as the sole basis for treatment or other  patient management decisions.  A negative result may occur with  improper specimen collection / handling, submission of specimen other  than nasopharyngeal swab, presence of viral mutation(s) within the  areas targeted by this assay, and inadequate number of viral copies  (<250 copies / mL). A negative result must be combined with clinical  observations, patient history, and epidemiological information. If result is POSITIVE SARS-CoV-2 target nucleic acids are DETECTED. The SARS-CoV-2 RNA is generally detectable in upper and lower  respiratory specimens dur ing the acute phase of infection.  Positive  results are indicative of active infection with SARS-CoV-2.  Clinical  correlation with patient history and other diagnostic information is  necessary to determine patient infection status.  Positive results do  not rule out bacterial infection or co-infection with other viruses. If result is PRESUMPTIVE POSTIVE SARS-CoV-2 nucleic acids MAY BE PRESENT.   A presumptive positive result was obtained on the submitted specimen  and confirmed on repeat testing.  While 2019 novel coronavirus  (SARS-CoV-2) nucleic acids may be present in the submitted sample  additional confirmatory testing may be necessary for epidemiological  and / or clinical management purposes  to differentiate between  SARS-CoV-2 and other Sarbecovirus currently known to infect humans.  If clinically indicated additional testing with an alternate test  methodology [phone]) is advised. The SARS-CoV-2 RNA is generally  detectable in upper and lower respiratory sp ecimens during the acute  phase of infection. The expected result is Negative. Fact Sheet for Patients:  StrictlyIdeas.no Fact Sheet  for Healthcare Providers: BankingDealers.co.za This test is not yet approved or cleared by the [place] FDA and has been authorized for  detection and/or diagnosis of SARS-CoV-2 by FDA under an Emergency Use Authorization (EUA).  This EUA will remain in effect (meaning this test can be used) for the duration of the COVID-19 declaration under Section 564(b)(1) of the Act, 21 U.S.C. section 360bbb-3(b)(1), unless the authorization is terminated or revoked sooner. Performed at [facility], [place] [address] [place], [place] [address]   Culture, Urine     Status: None   Collection Time: 01/25/19  1:20 AM   Specimen: Urine, Catheterized  Result Value Ref Range Status   Specimen Description URINE, CATHETERIZED  Final   Special Requests   Final    NONE Performed at [facility], [place] [address], [place], [place] [address]    Culture   Final    Multiple bacterial morphotypes present, none predominant. Suggest appropriate recollection if clinically indicated.   Report Status 01/26/2019 FINAL  Final  Culture, blood (routine x 2)     Status: None   Collection Time: 01/25/19  2:16 AM   Specimen: BLOOD RIGHT HAND  Result Value Ref Range Status   Specimen Description BLOOD RIGHT HAND  Final   Special Requests   Final    BOTTLES DRAWN AEROBIC AND ANAEROBIC Blood Culture adequate volume   Culture   Final    NO GROWTH 5 DAYS Performed at [facility], [address], [place], [place] [address]    Report Status 01/30/2019 FINAL  Final  Culture, blood (routine x 2)     Status: None   Collection Time: 01/25/19  2:16 AM   Specimen: BLOOD LEFT HAND  Result Value Ref Range Status   Specimen Description BLOOD LEFT HAND  Final   Special Requests   Final    BOTTLES DRAWN AEROBIC AND ANAEROBIC Blood Culture adequate volume   Culture   Final    NO GROWTH 5 DAYS Performed at [facility], [place] [address], [place], [place] [address]    Report Status  01/30/2019 FINAL  Final  C difficile quick scan w PCR reflex     Status: None   Collection Time: 01/26/19  6:33 PM   Specimen: STOOL  Result Value Ref Range Status   C Diff antigen NEGATIVE NEGATIVE Final   C Diff toxin NEGATIVE NEGATIVE Final   C Diff interpretation No C. difficile detected.  Final    Comment: Performed at [facility], [place] [address], [place], [place] [address]  Culture, blood (routine x 2)     Status: None   Collection Time: 01/26/19  8:10 PM   Specimen: BLOOD  Result Value Ref Range Status   Specimen Description BLOOD BLOOD RIGHT FOREARM  Final   Special Requests   Final    BOTTLES DRAWN AEROBIC AND ANAEROBIC Blood Culture adequate volume   Culture   Final    NO GROWTH 5 DAYS Performed at [facility], [place] [address] [place], [place] [address]    Report Status 01/31/2019 FINAL  Final  Culture, blood (routine x 2)     Status: None   Collection Time: 01/26/19  8:25 PM   Specimen: BLOOD LEFT HAND  Result Value Ref Range Status   Specimen Description BLOOD LEFT HAND  Final   Special Requests   Final    BOTTLES DRAWN AEROBIC ONLY Blood Culture adequate volume   Culture   Final    NO GROWTH 5 DAYS Performed at [facility], [place] [address], [place], [place]  [address]    Report Status 01/31/2019 FINAL  Final  Culture, Urine     Status: Abnormal   Collection Time: 01/28/19  6:40 PM   Specimen: Urine, Random  Result Value Ref Range Status   Specimen Description URINE, RANDOM  Final   Special Requests NONE  Final   Culture (A)  Final    <10,000 COLONIES/mL INSIGNIFICANT GROWTH Performed at [facility], [place] [address], [place], [place] [address]    Report Status 01/29/2019 FINAL  Final     Terri Piedra, NP [facility] [phone] Pager  02/01/2019  2:57 PM

## 2019-02-01 NOTE — Progress Notes (Signed)
[facility] for warfarin, heparin  Indication: atrial fibrillation and mechanical MVR  Vital Signs: Temp: 98 F (36.7 C) (09/24 0717) Temp Source: Oral (09/24 0717) BP: 105/67 (09/24 0717) Pulse Rate: 90 (09/24 0717)  Labs: Recent Labs    01/29/19 1635 01/29/19 1835  01/30/19 0327 01/31/19 0328 01/31/19 0917 01/31/19 1927 02/01/19 0248  HGB  --   --    < > 10.2*  --  11.4*  --  10.9*  HCT  --   --   --  31.4*  --  33.6*  --  32.7*  PLT  --   --   --  109*  --  124*  --  140*  LABPROT  --   --    < > 64.0* 28.0*  --  23.3* 26.8*  INR  --   --    < > 7.7* 2.7*  --  2.1* 2.5*  HEPARINUNFRC  --   --   --   --   --   --   --  <0.10*  CREATININE 1.63*  --   --  1.72*  --  1.56*  --  1.48*  TROPONINIHS 791* 718*  --   --   --   --   --   --    < > = values in this interval not displayed.    Estimated Creatinine Clearance: 33.7 mL/min (A) (by C-G formula based on SCr of 1.48 mg/dL (H)).  Assessment: 68 y.o. female admitted with CHF/SOB, h/o Afib and mechanical MVR, to continue warfarin. PTA regimen is 4 mg daily. INR has been elevated likely due to vitamin K deficiency. Last dose of warfarin was on 9/19. Heparin was started 9/23 due to INR= 2.1.  -INR= 2.5 (down from 7.7 after 1mg  IV vitamin K on 9/22)  Outpatient INR goal noted to be 2-3. And the goal was changed to 2.5-3.5 this admission   Goal of Therapy:  INR 2.5-3.5 Monitor platelets by anticoagulation protocol: Yes   Plan:  -With INR= 2.5, will discontinue heparin (resume if INR < 2.5 per consult order) -Coumadin 4mg  po today -Daily INR   Thank you for involving pharmacy in this patient's care.  **Pharmacist phone directory can be found on [email or web address] listed under [place]**

## 2019-02-01 NOTE — Progress Notes (Signed)
Progress Note  Patient Name: Marie Malone Date of Encounter: 02/01/2019  Primary Cardiologist: [name], MD   Subjective   Patient seems more alert today. Still with LLE pain. No complaints of chest pain, SOB, or palpitations.   Inpatient Medications    Scheduled Meds:  aspirin EC  81 mg Oral Daily   atorvastatin  40 mg Oral q1800   feeding supplement (ENSURE ENLIVE)  237 mL Oral BID BM   metoprolol succinate  12.5 mg Oral Daily   sodium bicarbonate  650 mg Oral TID   ursodiol  300 mg Oral BID   Warfarin - Pharmacist Dosing Inpatient   Does not apply q1800   Continuous Infusions:  cefTRIAXone (ROCEPHIN)  IV Stopped (01/31/19 4431)   heparin 1,050 Units/hr (02/01/19 0600)   vancomycin     PRN Meds: acetaminophen **OR** acetaminophen, ondansetron **OR** ondansetron (ZOFRAN) IV   Vital Signs    Vitals:   01/31/19 1522 01/31/19 2000 01/31/19 2050 02/01/19 0527  BP: 119/66  117/62 (!) 100/56  Pulse: 96 95 96 94  Resp: (!) 33 (!) 26 (!) 28 13  Temp: 100 F (37.8 C)  (!) 100.9 F (38.3 C) (!) 97.4 F (36.3 C)  TempSrc: Oral  Oral Oral  SpO2: 97% 98% 93% 94%  Weight:    69.5 kg  Height:        Intake/Output Summary (Last 24 hours) at 02/01/2019 0713 Last data filed at 02/01/2019 0600 Gross per 24 hour  Intake 912.3 ml  Output --  Net 912.3 ml   Filed Weights   01/29/19 0508 01/31/19 0456 02/01/19 0527  Weight: 64.6 kg 69.5 kg 69.5 kg    Telemetry    Atrial flutter with predominantly 2:1 AV block, rates in the 90s - Personally Reviewed   Physical Exam   GEN: Laying in bed in no acute distress.   Neck: No JVD, no carotid bruits Cardiac: IRIR, + mechanical valve click murmur, no rubs or gallops.  Respiratory: Clear to auscultation bilaterally, no wheezes/ rales/ rhonchi GI: NABS, Soft, nontender, non-distended  MS: +LLE edema with TTP and increased warmth; No deformity. Neuro:  Nonfocal, moving all extremities spontaneously Psych:  Normal affect   Labs    Chemistry Recent Labs  Lab 01/30/19 0327 01/31/19 0917 02/01/19 0248  NA 135 134* 138  K 3.8 4.0 3.6  CL 106 105 107  CO2 17* 17* 20*  GLUCOSE 94 131* 120*  BUN 60* 60* 59*  CREATININE 1.72* 1.56* 1.48*  CALCIUM 8.4* 8.6* 8.4*  PROT 8.1 8.1 7.3  ALBUMIN 2.8* 2.4* 2.2*  AST 34 38 34  ALT 13 14 13   ALKPHOS 75 89 90  BILITOT 1.5* 1.5* 1.8*  GFRNONAA 30* 34* 36*  GFRAA 35* 39* 42*  ANIONGAP 12 12 11      Hematology Recent Labs  Lab 01/30/19 0327 01/31/19 0917 02/01/19 0248  WBC 16.1* 18.7* 23.1*  RBC 3.60* 3.92 3.86*  HGB 10.2* 11.4* 10.9*  HCT 31.4* 33.6* 32.7*  MCV 87.2 85.7 84.7  MCH 28.3 29.1 28.2  MCHC 32.5 33.9 33.3  RDW 16.1* 16.1* 16.0*  PLT 109* 124* 140*    Cardiac EnzymesNo results for input(s): TROPONINI in the last 168 hours. No results for input(s): TROPIPOC in the last 168 hours.   BNPNo results for input(s): BNP, PROBNP in the last 168 hours.   DDimer No results for input(s): DDIMER in the last 168 hours.   Radiology    Ct Extremity Lower Left  Wo Contrast  Result Date: 01/30/2019 CLINICAL DATA:  Fullness and pain, compartment syndrome, supratherapeutic INR, rule out hemorrhage EXAM: CT OF THE LOWER LEFT EXTREMITY WITHOUT CONTRAST TECHNIQUE: Multidetector CT imaging of the lower left extremity was performed according to the standard protocol. COMPARISON:  07/17/2013 CT pelvis FINDINGS: Bones/Joint/Cartilage Hip and proximal femur not included. Mild tricompartmental degenerative spurring in the knee with small low-attenuation effusion. No fracture, dislocation, or worrisome bone lesions. The mid and forefoot are not included. Ligaments Suboptimally assessed by CT. Muscles and Tendons No intramuscular hematoma or mass. Soft tissues There is marked skin thickening and subcutaneous edema. No discrete hematoma or other fluid collection. Scattered vascular calcifications in popliteal and tibial vessels. IMPRESSION: 1. Negative for  hematoma or other acute finding. 2. 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 Electronically Signed   By: Lucrezia Europe M.D.   On: 01/30/2019 18:35    Cardiac Studies   Echocardiogram 01/24/2019: Impressions: 1. Left ventricular ejection fraction, by visual estimation, is 45 to 50%. The left ventricle has mildly decreased function. Normal left ventricular size. Left ventricular septal wall thickness was normal. Normal left ventricular posterior wall  thickness. There is no left ventricular hypertrophy. 2. Indeterminate diastolic function due to mechanical mitral valve pattern of LV diastolic filling. 3. Right ventricular pressure overload and right ventricular volume overload. 4. Global right ventricle has moderately reduced systolic function.The right ventricular size is moderately enlarged. No increase in right ventricular wall thickness. 5. Left atrial size was moderately dilated. 6. Right atrial size was severely dilated. 7. The mitral valve has been repaired/replaced. No evidence of mitral valve regurgitation. No evidence of mitral stenosis. 8. The tricuspid valve is normal in structure. Tricuspid valve regurgitation mild-moderate. 9. The aortic valve The aortic valve is normal in structure. Aortic valve regurgitation was not visualized by color flow Doppler. Structurally normal aortic valve, with no evidence of sclerosis or stenosis. 10. The pulmonic valve was normal in structure. Pulmonic valve regurgitation is mild by color flow Doppler. 11. Moderately elevated pulmonary artery systolic pressure. 12. The inferior vena cava is dilated in size with <50% respiratory variability, suggesting right atrial pressure of 15 mmHg. _______________  Lower Extremity Vascular Ultrasound 01/25/2019 (Preliminary Read): Summary: Right: There is no evidence of deep vein thrombosis in the lower extremity. However, portions of this examination were limited- see technologist comments  above. Left: There is no evidence of deep vein thrombosis in the lower extremity. However, portions of this examination were limited- see technologist comments above. Significant fluid noted in the posterior calf, etiology unknown.  Patient Profile     Ms. Dorfman is a26 y.o.femalewith a history of rheumatic fever/mitral valve stenosis s/p valvuloplasty at [facility] in 2008, CAD s/p CABG, mechanical MVR, and MAZE procedure in 05/2016, paroxysmal atrial fibrillation on Coumadin, PAD s/p endarterectomy in 03/2016 who was admitted on 01/24/2019 for acute on chronic CHF. Found to be in atrial fibrillation on presentation to the ED but spontaneous converted back to normal sinus rhythm. However, then on 01/28/2019 he was noted to be in atrial flutter.  Assessment & Plan    1. Paroxysmal atrial flutter with hx of paroxysmal atrial fibrillation s/p MAZE in 2018: appears to still be in atrial flutter with 2:1 AV block with controlled ventricular rates. INR improved to 2.5, down from 7.7 after receiving Vitamin K 9/22.  - Could consider DCCV prior to discharge once acute illness improves.  - Continue coumadin per pharmacy for stroke ppx. If INR dips below therapeutic  window, would then have to consider TEE/DCCV - Continue metoprolol succinate for rate control  2. Acute on chronic combined CHF: patient presented with SOB. BNP elevated to 1600s. CXR with pulmonary edema and small right pleural effusion. Echo with EF 45-50% with right ventricle dysfunction. Initially diuresed with IV lasix, however this was stopped 01/26/2019 due to diarrhea, poor po intake, and elevated Cr. Volume status appears stable - Continue to monitor strict I&Os and daily weights - Continue metoprolol succinate  3. Chest pain in patient with CAD s/p CABG in 2018: HsT 791>718 on labs yesterday, up from 111>113 on admission. Patient did not recall complaints of chest pain. EKG was without changes from previous with chronic ST-T wave  abnormalities. Reported to have TTP of chest wall - Continue to monitor for recurrent chest pain  4. Mechanical MVR: hx rheumatic fever. S/p valvuloplasty at [facility] in 2008, then mechanical MVR at the time of CABG and MAZE in 2018. INR stable at 2.5 after Vitamin K 9/22, up to 7.7 this admission.  - Continue coumadin per pharmacy  5. Possible OSA: desaturations noted overnight - Consider sleep study outpatient  6. AKI: Cr peaked at 1.78, continues to improve to 1.48 on labs today.  - Continue to monitor   7. LLE cellulitis: On antibiotics. Vascular curbsided to review LLE [place] who felt fluid in posterior calf was c/w edema.  - Continue management per primary team  For questions or updates, please contact [facility] Please consult [email or web address] for contact info under Cardiology/STEMI.      Signed, Abigail Butts, PA-C  02/01/2019, 7:13 AM   [phone]

## 2019-02-01 NOTE — Progress Notes (Signed)
PROGRESS NOTE    Marie Malone  TKW:[id number] DOB: [birth year] DOA: 01/24/2019 PCP: Marie Alar, NP   Brief Narrative:  Marie Malone 68 y.o. 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 Patient states over the last 2 weeks patient has been getting progressively short of breath on exertion. Denies any chest pain fever chills or productive cough. Patient's primary care physician advised her to increase her Lasix twice daily which she is supposed to start today. Because of worsening shortness of breath patient came to the ER. EMS was called and patient initially was placed on BiPAP. Patient also had complained of some left calf cramping.  ED Course:In the ER patient was hypoxic placed on BiPAP chest x-ray shows features consistent with CHF. Lab work show [name] BNP of 1699, sodium 139 potassium 4.2 creatinine 1.1 WBC count of 7.6 hemoglobin 12.6 platelets 82 and EKG was showing Myrtle Barnhard. fib rate at 106 bpm. Patient was given 60 mg IV Lasix following which patient was able to be weaned off BiPAP and presently is on 5 L oxygen. Patient admitted for further management of acute CHF. COVID-19 test was negative. On exam patient has good peripheral pulses with some edema in the lower extremity.  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   S/P CABG x 1   Acute on chronic systolic CHF (congestive heart failure) (HCC)   Atrial fibrillation, chronic   Cellulitis of left leg   Acute renal failure (HCC)   Primary biliary cirrhosis (HCC)   Chest pain at rest   H/O mitral valve replacement with  mechanical valve   Atypical atrial flutter (HCC)  Acute respiratory failure with hypoxia -Not on home O2 -Currently on room air  Chest pain acute started 9/21  Elevated Troponin - Patient borderline hypotensive with MAP 60-63 - Obtain 12-lead EKG; atrial flutter, nonspecific ST-T wave changes no changes from previous EKG - Troponin high-sensitivity 791 -> 718 - Albumin 50 g - Normal saline bolus 550ml - BMP, Mg WNL - BP improved, after albumin and normal saline bolus. - cards following and aware  Acute on chronic systolic CHF -Strict in and out +3.1 L (inaccurate) -Daily weight           01/29/19 0508     64.6 kg  - EF 40 to 45% see echocardiogram results below - Per cardiology discontinue Lasix, ARB, and K-Dur -9/20 decreased metoprolol XR 12.5 mg daily (patient's BP on the hypotensive side) - Cardiology c/s - appreciate recs - recommending resume lasix 40 mg PO tomorrow -  Consider DCCV prior to d/c (vs TEE/DCCV if pt INR becomes subtherapeutic)  Mechanical mitral valve - Continue warfarin, monitor INR - down to 2.7 today -> repeat this PM and start heparin if falls below 2.5 (discussed with pharmacy)     -Therapeutic INR goal 2.5-3.5 -9/23 INR subtherapeutic 2.1 on 9/23 PM.  Today is 2.5 therapeutic.  Heparin has been d/c'd. - s/p reversal for rising INR on 9/22  Atrial fibrillation chronic - Currently NSR - Anticoagulated for her mechanical mitral valve  LLE cellulitis vs SIRS - Blood culture, urine culture NGTD  -  leukocytosis is worsening (continues to rise) - follow procalcitonin - vanc/ceftriaxone - narrowed to ancef per ID - ID c/s due to slow to improve - Patient also with loose stools (possibly from Ursodiol) however patient has been chronically on this medication. - lactoferrin negative and c diff negative - pending GI path panel pending, fecal fat - See CHF and PBS   LLE Swelling - some concern for posterior knee bleed vs compartment syndrome  yesterday - CT was not suggestive of hematoma, but did have marked skin thickening and subcutaneous edema - 9/21 L LE cellulitis slightly worse today, ? secondary to hypoperfusion with low BP -9/22 LLE fullness and pain increasing obtain CT LLE, given patient's supratherapeutic INR evaluate for bleeding and to left posterior knee joint and calf - LLE [place] from 9/22 read an notable for "significant fluid in posterior calf, unclear etiology" - discussed this with vascular to ensure this did not appear to be fluid collection and he noted appeared c/w edema - pt with good distal pulses, will hold off on vascular c/s  Acute renal failure (baseline Cr 1.0)  NAGMA Last Labs          Recent Labs  Lab 01/28/19 0441 01/28/19 1551 01/29/19 0527 01/29/19 1635 01/30/19 0327  CREATININE 1.64* 1.51* 1.78* 1.63* 1.72*    - creatinine continues to improve today - Most likely secondary to overdiuresis and diarrhea. - 9/20 obtain urine lites; calculate FeNa - Hold IVF for now - bicarb 17 today, will try oral - ABG pending  Primary biliary cirrhosis(PBC) - 9/22 restart Ursodial 300 mg BID.   - pt c/o itching, possibly related to cellulitis?  Diarrhea - 9/22 patient reports diarrhea to cardiology, though has denied diarrhea to me on several occasions. -Fecal fat pending -GI pathogen panel by PCR pending -Lactoferrin ending -Stool osmolality pending -Stool K/NA pending - See PBC.  Goals of care - On 9/21 place PT/OT request patient was able to ambulate independently at home prior to this hospitalization.  Due to the severity of the LLE cellulitis unsure patient will be able to discharge home.  DVT prophylaxis: warfarin, may need heparin bridge (INR goal 2.5-3.5) Code Status: full  Family Communication: none at bedside - sister over phone Disposition Plan: pending further improvement  Consultants:   cardiology  Procedures:  LE [place] Summary: Right: There is no evidence of deep vein  thrombosis in the lower extremity. However, portions of this examination were limited- see technologist comments above. Left: There is no evidence of deep vein thrombosis in the lower extremity. However, portions of this examination were limited- see technologist comments above. Significant fluid noted in the posterior calf, etiology unknown.   *See table(s) above for measurements and observations.  Electronically signed by Deitra Mayo MD on 01/30/2019 at 9:32:51 AM.  Echo IMPRESSIONS    1. Left ventricular ejection fraction, by visual estimation, is 45 to 50%. The left ventricle has mildly decreased function. Normal left ventricular size. Left ventricular septal wall thickness was normal. Normal left ventricular posterior wall  thickness. There is no left ventricular hypertrophy.  2. Indeterminate diastolic function due to mechanical mitral valve pattern of LV diastolic filling.  3. Right ventricular pressure overload and right ventricular volume overload.  4. Global right ventricle has moderately reduced systolic function.The right ventricular size is moderately enlarged. No increase in right ventricular wall thickness.  5. Left atrial size was moderately dilated.  6. Right atrial size was severely dilated.  7. The mitral valve has been  repaired/replaced. No evidence of mitral valve regurgitation. No evidence of mitral stenosis.  8. The tricuspid valve is normal in structure. Tricuspid valve regurgitation mild-moderate.  9. The aortic valve The aortic valve is normal in structure. Aortic valve regurgitation was not visualized by color flow Doppler. Structurally normal aortic valve, with no evidence of sclerosis or stenosis. 10. The pulmonic valve was normal in structure. Pulmonic valve regurgitation is mild by color flow Doppler. 11. Moderately elevated pulmonary artery systolic pressure. 12. The inferior vena cava is dilated in size with <50% respiratory variability, suggesting  right atrial pressure of 15 mmHg.  Antimicrobials:  Anti-infectives (From admission, onward)   Start     Dose/Rate Route Frequency Ordered Stop   02/01/19 2000  ceFAZolin (ANCEF) IVPB 2g/100 mL premix     2 g 200 mL/hr over 30 Minutes Intravenous Every 8 hours 02/01/19 1524     02/01/19 1300  vancomycin (VANCOCIN) IVPB 750 mg/150 ml premix  Status:  Discontinued     750 mg 150 mL/hr over 60 Minutes Intravenous Every 24 hours 01/31/19 1152 02/01/19 1519   01/31/19 1300  vancomycin (VANCOCIN) 1,250 mg in sodium chloride 0.9 % 250 mL IVPB     1,250 mg 166.7 mL/hr over 90 Minutes Intravenous  Once 01/31/19 1152 01/31/19 1448   01/27/19 0930  cefTRIAXone (ROCEPHIN) 2 g in sodium chloride 0.9 % 100 mL IVPB  Status:  Discontinued     2 g 200 mL/hr over 30 Minutes Intravenous Every 24 hours 01/27/19 0824 02/01/19 1519      Subjective: Continued LEE pain and swelling C/o pain  Objective: Vitals:   02/01/19 0527 02/01/19 0717 02/01/19 1240 02/01/19 1411  BP: (!) 100/56 105/67 121/60   Pulse: 94 90 91   Resp: 13 (!) 23 (!) 29   Temp: (!) 97.4 F (36.3 C) 98 F (36.7 C)  99.1 F (37.3 C)  TempSrc: Oral Oral  Oral  SpO2: 94% 92% 99%   Weight: 69.5 kg     Height:        Intake/Output Summary (Last 24 hours) at 02/01/2019 1637 Last data filed at 02/01/2019 1120 Gross per 24 hour  Intake 851.09 ml  Output -  Net 851.09 ml   Filed Weights   01/29/19 0508 01/31/19 0456 02/01/19 0527  Weight: 64.6 kg 69.5 kg 69.5 kg    Examination:  General: No acute distress. Cardiovascular: Heart sounds show Jadiel Schmieder regular rate, and rhythm.  Lungs: Clear to auscultation bilaterally  Abdomen: Soft, nontender, nondistended Neurological: Alert and oriented 3. Moves all extremities 4. Cranial nerves II through XII grossly intact. Extremities: LLE edema, ruptured blisters    Data Reviewed: I have personally reviewed following labs and imaging studies  CBC: Recent Labs  Lab 01/28/19 0441  01/29/19 0527 01/30/19 0327 01/31/19 0917 02/01/19 0248  WBC 11.4* 13.2* 16.1* 18.7* 23.1*  HGB 11.4* 11.2* 10.2* 11.4* 10.9*  HCT 36.1 34.7* 31.4* 33.6* 32.7*  MCV 89.8 88.5 87.2 85.7 84.7  PLT 79* 96* 109* 124* 161*   Basic Metabolic Panel: Recent Labs  Lab 01/28/19 0441  01/29/19 0527 01/29/19 1635 01/30/19 0327 01/31/19 0917 02/01/19 0248  NA 135   < > 136 136 135 134* 138  K 4.3   < > 4.1 4.1 3.8 4.0 3.6  CL 108   < > 106 107 106 105 107  CO2 17*   < > 19* 18* 17* 17* 20*  GLUCOSE 92   < > 105* 121* 94 131* 120*  BUN 55*   < > 60* 59* 60* 60* 59*  CREATININE 1.64*   < > 1.78* 1.63* 1.72* 1.56* 1.48*  CALCIUM 8.4*   < > 8.5* 8.3* 8.4* 8.6* 8.4*  MG 2.2  --  2.2 2.4 2.2  --  2.4   < > = values in this interval not displayed.   GFR: Estimated Creatinine Clearance: 33.7 mL/min (Lorenza Winkleman) (by C-G formula based on SCr of 1.48 mg/dL (H)). Liver Function Tests: Recent Labs  Lab 01/28/19 0441 01/29/19 0527 01/30/19 0327 01/31/19 0917 02/01/19 0248  AST 32 38 34 38 34  ALT 16 14 13 14 13   ALKPHOS 65 66 75 89 90  BILITOT 2.0* 1.6* 1.5* 1.5* 1.8*  PROT 8.1 7.8 8.1 8.1 7.3  ALBUMIN 2.3* 2.2* 2.8* 2.4* 2.2*   No results for input(s): LIPASE, AMYLASE in the last 168 hours. No results for input(s): AMMONIA in the last 168 hours. Coagulation Profile: Recent Labs  Lab 01/29/19 0527 01/30/19 0327 01/31/19 0328 01/31/19 1927 02/01/19 0248  INR 6.6* 7.7* 2.7* 2.1* 2.5*   Cardiac Enzymes: No results for input(s): CKTOTAL, CKMB, CKMBINDEX, TROPONINI in the last 168 hours. BNP (last 3 results) No results for input(s): PROBNP in the last 8760 hours. HbA1C: No results for input(s): HGBA1C in the last 72 hours. CBG: No results for input(s): GLUCAP in the last 168 hours. Lipid Profile: No results for input(s): CHOL, HDL, LDLCALC, TRIG, CHOLHDL, LDLDIRECT in the last 72 hours. Thyroid Function Tests: No results for input(s): TSH, T4TOTAL, FREET4, T3FREE, THYROIDAB in the last  72 hours. Anemia Panel: No results for input(s): VITAMINB12, FOLATE, FERRITIN, TIBC, IRON, RETICCTPCT in the last 72 hours. Sepsis Labs: Recent Labs  Lab 01/31/19 1927 02/01/19 0248  PROCALCITON 2.09 2.37    Recent Results (from the past 240 hour(s))  SARS Coronavirus 2 [facility] order, Performed in [facility] hospital lab) Nasopharyngeal Nasopharyngeal Swab     Status: None   Collection Time: 01/12/2019 10:12 PM   Specimen: Nasopharyngeal Swab  Result Value Ref Range Status   SARS Coronavirus 2 NEGATIVE NEGATIVE Final    Comment: (NOTE) If result is NEGATIVE SARS-CoV-2 target nucleic acids are NOT DETECTED. The SARS-CoV-2 RNA is generally detectable in upper and lower  respiratory specimens during the acute phase of infection. The lowest  concentration of SARS-CoV-2 viral copies this assay can detect is 250  copies / mL. Lakia Gritton negative result does not preclude SARS-CoV-2 infection  and should not be used as the sole basis for treatment or other  patient management decisions.  Nadie Fiumara negative result may occur with  improper specimen collection / handling, submission of specimen other  than nasopharyngeal swab, presence of viral mutation(s) within the  areas targeted by this assay, and inadequate number of viral copies  (<250 copies / mL). Negar Sieler negative result must be combined with clinical  observations, patient history, and epidemiological information. If result is POSITIVE SARS-CoV-2 target nucleic acids are DETECTED. The SARS-CoV-2 RNA is generally detectable in upper and lower  respiratory specimens dur ing the acute phase of infection.  Positive  results are indicative of active infection with SARS-CoV-2.  Clinical  correlation with patient history and other diagnostic information is  necessary to determine patient infection status.  Positive results do  not rule out bacterial infection or co-infection with other viruses. If result is PRESUMPTIVE POSTIVE SARS-CoV-2 nucleic acids MAY  BE PRESENT.   Lareina Espino presumptive positive result was obtained on the submitted specimen  and confirmed on  repeat testing.  While 2019 novel coronavirus  (SARS-CoV-2) nucleic acids may be present in the submitted sample  additional confirmatory testing may be necessary for epidemiological  and / or clinical management purposes  to differentiate between  SARS-CoV-2 and other Sarbecovirus currently known to infect humans.  If clinically indicated additional testing with an alternate test  methodology [phone]) is advised. The SARS-CoV-2 RNA is generally  detectable in upper and lower respiratory sp ecimens during the acute  phase of infection. The expected result is Negative. Fact Sheet for Patients:  StrictlyIdeas.no Fact Sheet for Healthcare Providers: BankingDealers.co.za This test is not yet approved or cleared by the [place] FDA and has been authorized for detection and/or diagnosis of SARS-CoV-2 by FDA under an Emergency Use Authorization (EUA).  This EUA will remain in effect (meaning this test can be used) for the duration of the COVID-19 declaration under Section 564(b)(1) of the Act, 21 U.S.C. section 360bbb-3(b)(1), unless the authorization is terminated or revoked sooner. Performed at [facility], [place] [address], [place], [place] [address]   Culture, Urine     Status: None   Collection Time: 01/25/19  1:20 AM   Specimen: Urine, Catheterized  Result Value Ref Range Status   Specimen Description URINE, CATHETERIZED  Final   Special Requests   Final    NONE Performed at [facility], [place] [address] [place], [place] [address]    Culture   Final    Multiple bacterial morphotypes present, none predominant. Suggest appropriate recollection if clinically indicated.   Report Status 01/26/2019 FINAL  Final  Culture, blood (routine x 2)     Status: None   Collection Time: 01/25/19  2:16 AM   Specimen: BLOOD RIGHT HAND   Result Value Ref Range Status   Specimen Description BLOOD RIGHT HAND  Final   Special Requests   Final    BOTTLES DRAWN AEROBIC AND ANAEROBIC Blood Culture adequate volume   Culture   Final    NO GROWTH 5 DAYS Performed at [facility], [address] [place], [place] [address]    Report Status 01/30/2019 FINAL  Final  Culture, blood (routine x 2)     Status: None   Collection Time: 01/25/19  2:16 AM   Specimen: BLOOD LEFT HAND  Result Value Ref Range Status   Specimen Description BLOOD LEFT HAND  Final   Special Requests   Final    BOTTLES DRAWN AEROBIC AND ANAEROBIC Blood Culture adequate volume   Culture   Final    NO GROWTH 5 DAYS Performed at [facility], [place] [address], [place], [place] [address]    Report Status 01/30/2019 FINAL  Final  C difficile quick scan w PCR reflex     Status: None   Collection Time: 01/26/19  6:33 PM   Specimen: STOOL  Result Value Ref Range Status   C Diff antigen NEGATIVE NEGATIVE Final   C Diff toxin NEGATIVE NEGATIVE Final   C Diff interpretation No C. difficile detected.  Final    Comment: Performed at [facility], [place] [address] [place], [place] [address]  Culture, blood (routine x 2)     Status: None   Collection Time: 01/26/19  8:10 PM   Specimen: BLOOD  Result Value Ref Range Status   Specimen Description BLOOD BLOOD RIGHT FOREARM  Final   Special Requests   Final    BOTTLES DRAWN AEROBIC AND ANAEROBIC Blood Culture adequate volume  Culture   Final    NO GROWTH 5 DAYS Performed at [facility], [place] [address], [place], [place] [address]    Report Status 01/31/2019 FINAL  Final  Culture, blood (routine x 2)     Status: None   Collection Time: 01/26/19  8:25 PM   Specimen: BLOOD LEFT HAND  Result Value Ref Range Status   Specimen Description BLOOD LEFT HAND  Final   Special Requests   Final    BOTTLES DRAWN AEROBIC ONLY Blood Culture adequate volume   Culture   Final    NO GROWTH 5 DAYS Performed  at [facility], [place] [address] [place], [place] [address]    Report Status 01/31/2019 FINAL  Final  Culture, Urine     Status: Abnormal   Collection Time: 01/28/19  6:40 PM   Specimen: Urine, Random  Result Value Ref Range Status   Specimen Description URINE, RANDOM  Final   Special Requests NONE  Final   Culture (Pierra Skora)  Final    <10,000 COLONIES/mL INSIGNIFICANT GROWTH Performed at [facility], [place] [address], [place], [place] [address]    Report Status 01/29/2019 FINAL  Final         Radiology Studies: Dg Chest 2 View  Result Date: 02/01/2019 CLINICAL DATA:  Fever. EXAM: CHEST - 2 VIEW COMPARISON:  Radiographs of January 29, 2019. FINDINGS: Stable cardiomediastinal silhouette. Status post coronary artery bypass graft and cardiac valve repair. Atherosclerosis of thoracic aorta is noted. No pneumothorax or pleural effusion is noted. No acute pulmonary disease is noted. Bony thorax is unremarkable. IMPRESSION: No active cardiopulmonary disease. Aortic Atherosclerosis (ICD10-I70.0). Electronically Signed   By: Marijo Conception M.D.   On: 02/01/2019 11:44   Ct Extremity Lower Left Wo Contrast  Result Date: 01/30/2019 CLINICAL DATA:  Fullness and pain, compartment syndrome, supratherapeutic INR, rule out hemorrhage EXAM: CT OF THE LOWER LEFT EXTREMITY WITHOUT CONTRAST TECHNIQUE: Multidetector CT imaging of the lower left extremity was performed according to the standard protocol. COMPARISON:  07/17/2013 CT pelvis FINDINGS: Bones/Joint/Cartilage Hip and proximal femur not included. Mild tricompartmental degenerative spurring in the knee with small low-attenuation effusion. No fracture, dislocation, or worrisome bone lesions. The mid and forefoot are not included. Ligaments Suboptimally assessed by CT. Muscles and Tendons No intramuscular hematoma or mass. Soft tissues There is marked skin thickening and subcutaneous edema. No discrete hematoma or other fluid collection. Scattered  vascular calcifications in popliteal and tibial vessels. IMPRESSION: 1. Negative for hematoma or other acute finding. 2. 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 Electronically Signed   By: Lucrezia Europe M.D.   On: 01/30/2019 18:35        Scheduled Meds: . aspirin EC  81 mg Oral Daily  . atorvastatin  40 mg Oral q1800  . feeding supplement (ENSURE ENLIVE)  237 mL Oral BID BM  . [START ON 02/02/2019] furosemide  40 mg Oral Daily  . metoprolol succinate  12.5 mg Oral Daily  . sodium bicarbonate  650 mg Oral TID  . ursodiol  300 mg Oral BID  . warfarin  4 mg Oral ONCE-1800  . Warfarin - Pharmacist Dosing Inpatient   Does not apply q1800   Continuous Infusions: .  ceFAZolin (ANCEF) IV       LOS: 8 days    Time spent: over 30 min    Fayrene Helper, MD [facility] Pager AMION  If 7PM-7AM, please contact night-coverage [email or web address] Password [facility] 02/01/2019,  4:37 PM

## 2019-02-01 NOTE — Progress Notes (Signed)
ANTICOAGULATION CONSULT NOTE - Follow Up Consult  Pharmacy Consult for Heparin Indication: mech MVR + afib   Allergies  Allergen Reactions  . No Known Allergies     Patient Measurements: Height: 5\' 2"  (157.5 cm) Weight: 153 lb 4.8 oz (69.5 kg) IBW/kg (Calculated) : 50.1 Heparin Dosing Weight: 65 kg  Vital Signs: Temp: 100.9 F (38.3 C) (09/23 2050) Temp Source: Oral (09/23 2050) BP: 117/62 (09/23 2050) Pulse Rate: 96 (09/23 2050)  Labs: Recent Labs    01/29/19 0527 01/29/19 1635 01/29/19 1835 01/30/19 0327 01/31/19 0328 01/31/19 0917 01/31/19 1927 02/01/19 0248  HGB 11.2*  --   --  10.2*  --  11.4*  --   --   HCT 34.7*  --   --  31.4*  --  33.6*  --   --   PLT 96*  --   --  109*  --  124*  --   --   LABPROT 56.6*  --   --  64.0* 28.0*  --  23.3* 26.8*  INR 6.6*  --   --  7.7* 2.7*  --  2.1* 2.5*  HEPARINUNFRC  --   --   --   --   --   --   --  <0.10*  CREATININE 1.78* 1.63*  --  1.72*  --  1.56*  --  1.48*  TROPONINIHS  --  791* 718*  --   --   --   --   --     Estimated Creatinine Clearance: 33.7 mL/min (A) (by C-G formula based on SCr of 1.48 mg/dL (H)).   Assessment:  Anticoag:  Warfarin for hx mech MVR + afib (PTA 4mg  daily) INR 4.7>>6.6>7.7 (1mg  IV vit K)>>2.7>2.1, Hgb 11.4, plts 124  9/24 AM update: Currently on heparin while INR was <2.5. INR is 2.5 this AM. Can likely DC heparin tomorrow if INR still >2.5. Heparin level is low this AM, no issues per RN.  Goal of Therapy:  Heparin level 0.3-0.7 units/ml  INR 2.5-3.5 Monitor platelets by anticoagulation protocol: Yes   Plan:  -Inc heparin to 1050 units/hr -1300 heparin level -Can likely DC heparin tomorrow if INR is still good  Narda Bonds, PharmD, BCPS Clinical Pharmacist Phone: [phone]

## 2019-02-02 DIAGNOSIS — D72829 Elevated white blood cell count, unspecified: Secondary | ICD-10-CM

## 2019-02-02 LAB — CBC
HCT: 32.8 % — ABNORMAL LOW (ref 36.0–46.0)
Hemoglobin: 10.9 g/dL — ABNORMAL LOW (ref 12.0–15.0)
MCH: 28.4 pg (ref 26.0–34.0)
MCHC: 33.2 g/dL (ref 30.0–36.0)
MCV: 85.4 fL (ref 80.0–100.0)
Platelets: 138 10*3/uL — ABNORMAL LOW (ref 150–400)
RBC: 3.84 MIL/uL — ABNORMAL LOW (ref 3.87–5.11)
RDW: 16.3 % — ABNORMAL HIGH (ref 11.5–15.5)
WBC: 19.9 10*3/uL — ABNORMAL HIGH (ref 4.0–10.5)
nRBC: 0.1 % (ref 0.0–0.2)

## 2019-02-02 LAB — PROCALCITONIN: Procalcitonin: 1.97 ng/mL

## 2019-02-02 LAB — COMPREHENSIVE METABOLIC PANEL
ALT: 15 U/L (ref 0–44)
AST: 39 U/L (ref 15–41)
Albumin: 2.2 g/dL — ABNORMAL LOW (ref 3.5–5.0)
Alkaline Phosphatase: 80 U/L (ref 38–126)
Anion gap: 11 (ref 5–15)
BUN: 62 mg/dL — ABNORMAL HIGH (ref 8–23)
CO2: 20 mmol/L — ABNORMAL LOW (ref 22–32)
Calcium: 8.5 mg/dL — ABNORMAL LOW (ref 8.9–10.3)
Chloride: 105 mmol/L (ref 98–111)
Creatinine, Ser: 1.52 mg/dL — ABNORMAL HIGH (ref 0.44–1.00)
GFR calc Af Amer: 41 mL/min — ABNORMAL LOW (ref >60)
GFR calc non Af Amer: 35 mL/min — ABNORMAL LOW (ref >60)
Glucose, Bld: 106 mg/dL — ABNORMAL HIGH (ref 70–99)
Potassium: 3.9 mmol/L (ref 3.5–5.1)
Sodium: 136 mmol/L (ref 135–145)
Total Bilirubin: 2 mg/dL — ABNORMAL HIGH (ref 0.3–1.2)
Total Protein: 7.9 g/dL (ref 6.5–8.1)

## 2019-02-02 LAB — PROTIME-INR
INR: 3 — ABNORMAL HIGH (ref 0.8–1.2)
Prothrombin Time: 30.7 seconds — ABNORMAL HIGH (ref 11.4–15.2)

## 2019-02-02 LAB — MAGNESIUM: Magnesium: 2.3 mg/dL (ref 1.7–2.4)

## 2019-02-02 MED ORDER — WARFARIN SODIUM 2 MG PO TABS
2.0000 mg | ORAL_TABLET | Freq: Once | ORAL | Status: AC
  Administered 2019-02-02: 18:00:00 2 mg via ORAL
  Filled 2019-02-02: qty 1

## 2019-02-02 NOTE — Progress Notes (Signed)
Progress Note  Patient Name: Marie Malone Date of Encounter: 02/02/2019  Primary Cardiologist: [name], MD   Subjective   She reports some back discomfort this morning. Still with LLE pain which she states is overall improved. She reports worsening of L foot swelling. No complaints of chest pain, SOB, or palpitations.   Inpatient Medications    Scheduled Meds: . aspirin EC  81 mg Oral Daily  . atorvastatin  40 mg Oral q1800  . feeding supplement (ENSURE ENLIVE)  237 mL Oral BID BM  . furosemide  40 mg Oral Daily  . metoprolol succinate  12.5 mg Oral Daily  . sodium bicarbonate  650 mg Oral TID  . ursodiol  300 mg Oral BID  . Warfarin - Pharmacist Dosing Inpatient   Does not apply q1800   Continuous Infusions: .  ceFAZolin (ANCEF) IV 2 g (02/02/19 0555)   PRN Meds: acetaminophen **OR** acetaminophen, ondansetron **OR** ondansetron (ZOFRAN) IV, oxyCODONE   Vital Signs    Vitals:   02/01/19 1959 02/01/19 2000 02/02/19 0558 02/02/19 0828  BP: 121/60  108/64   Pulse: 100 (!) 102 96 92  Resp: (!) 24 (!) 23 19 (!) 22  Temp: 100 F (37.8 C)  (!) 97.5 F (36.4 C)   TempSrc: Oral  Oral   SpO2: 93% 97% 96% 98%  Weight:   70.5 kg   Height:        Intake/Output Summary (Last 24 hours) at 02/02/2019 0946 Last data filed at 02/01/2019 1700 Gross per 24 hour  Intake 682.79 ml  Output -  Net 682.79 ml   Filed Weights   01/31/19 0456 02/01/19 0527 02/02/19 0558  Weight: 69.5 kg 69.5 kg 70.5 kg    Telemetry    Atrial flutter with 2:1 AV block with rates in the 90s - Personally Reviewed  ECG    No new tracings  Physical Exam   GEN: Sitting upright in bed in no acute distress.   Neck: No JVD, no carotid bruits Cardiac: RRR, + mechanical valve click, no rubs, or gallops.  Respiratory: Clear to auscultation bilaterally, no wheezes/ rales/ rhonchi GI: NABS, Soft, nontender, non-distended  MS: 1+ RLE edema; 2+ LLE edema with increased warmth and erythema; No  deformity. Neuro:  Nonfocal, moving all extremities spontaneously Psych: Normal affect   Labs    Chemistry Recent Labs  Lab 01/31/19 0917 02/01/19 0248 02/02/19 0342  NA 134* 138 136  K 4.0 3.6 3.9  CL 105 107 105  CO2 17* 20* 20*  GLUCOSE 131* 120* 106*  BUN 60* 59* 62*  CREATININE 1.56* 1.48* 1.52*  CALCIUM 8.6* 8.4* 8.5*  PROT 8.1 7.3 7.9  ALBUMIN 2.4* 2.2* 2.2*  AST 38 34 39  ALT 14 13 15   ALKPHOS 89 90 80  BILITOT 1.5* 1.8* 2.0*  GFRNONAA 34* 36* 35*  GFRAA 39* 42* 41*  ANIONGAP 12 11 11      Hematology Recent Labs  Lab 01/31/19 0917 02/01/19 0248 02/02/19 0342  WBC 18.7* 23.1* 19.9*  RBC 3.92 3.86* 3.84*  HGB 11.4* 10.9* 10.9*  HCT 33.6* 32.7* 32.8*  MCV 85.7 84.7 85.4  MCH 29.1 28.2 28.4  MCHC 33.9 33.3 33.2  RDW 16.1* 16.0* 16.3*  PLT 124* 140* 138*    Cardiac EnzymesNo results for input(s): TROPONINI in the last 168 hours. No results for input(s): TROPIPOC in the last 168 hours.   BNPNo results for input(s): BNP, PROBNP in the last 168 hours.   DDimer  No results for input(s): DDIMER in the last 168 hours.   Radiology    Dg Chest 2 View  Result Date: 02/01/2019 CLINICAL DATA:  Fever. EXAM: CHEST - 2 VIEW COMPARISON:  Radiographs of January 29, 2019. FINDINGS: Stable cardiomediastinal silhouette. Status post coronary artery bypass graft and cardiac valve repair. Atherosclerosis of thoracic aorta is noted. No pneumothorax or pleural effusion is noted. No acute pulmonary disease is noted. Bony thorax is unremarkable. IMPRESSION: No active cardiopulmonary disease. Aortic Atherosclerosis (ICD10-I70.0). Electronically Signed   By: Marijo Conception M.D.   On: 02/01/2019 11:44    Cardiac Studies   Echocardiogram 01/24/2019: Impressions: 1. Left ventricular ejection fraction, by visual estimation, is 45 to 50%. The left ventricle has mildly decreased function. Normal left ventricular size. Left ventricular septal wall thickness was normal. Normal left  ventricular posterior wall  thickness. There is no left ventricular hypertrophy. 2. Indeterminate diastolic function due to mechanical mitral valve pattern of LV diastolic filling. 3. Right ventricular pressure overload and right ventricular volume overload. 4. Global right ventricle has moderately reduced systolic function.The right ventricular size is moderately enlarged. No increase in right ventricular wall thickness. 5. Left atrial size was moderately dilated. 6. Right atrial size was severely dilated. 7. The mitral valve has been repaired/replaced. No evidence of mitral valve regurgitation. No evidence of mitral stenosis. 8. The tricuspid valve is normal in structure. Tricuspid valve regurgitation mild-moderate. 9. The aortic valve The aortic valve is normal in structure. Aortic valve regurgitation was not visualized by color flow Doppler. Structurally normal aortic valve, with no evidence of sclerosis or stenosis. 10. The pulmonic valve was normal in structure. Pulmonic valve regurgitation is mild by color flow Doppler. 11. Moderately elevated pulmonary artery systolic pressure. 12. The inferior vena cava is dilated in size with <50% respiratory variability, suggesting right atrial pressure of 15 mmHg. _______________  Lower Extremity Vascular Ultrasound 01/25/2019 (Preliminary Read): Summary: Right: There is no evidence of deep vein thrombosis in the lower extremity. However, portions of this examination were limited- see technologist comments above. Left: There is no evidence of deep vein thrombosis in the lower extremity. However, portions of this examination were limited- see technologist comments above. Significant fluid noted in the posterior calf, etiology unknown.  Patient Profile     68 y.o.femalewith a history of rheumatic fever/mitral valve stenosis s/p valvuloplasty at [facility] in 2008, CAD s/p CABG, mechanical MVR, and MAZE procedure in 05/2016, paroxysmal atrial  fibrillation on Coumadin, PAD s/p endarterectomy in 03/2016 who was admitted on 01/24/2019 for acute on chronic CHF. Found to be in atrial fibrillation on presentation to the ED but spontaneous converted back to normal sinus rhythm. However, then on 01/28/2019 he was noted to be in atrial flutter.  Assessment & Plan    1. Paroxysmal atrial flutter with hx of paroxysmal atrial fibrillation s/p MAZE in 2018:appears to still be in atrial flutter with 2:1 AV block with controlled ventricular rates. INR remains therapeutic at 3.0 today, down from 7.7 after receiving Vitamin K 9/22.  - Could consider DCCV prior to discharge once acute illness improves.  - Continue coumadin per pharmacy for stroke ppx. - Continue metoprolol succinate for rate control  2. Acute on chronic combined AYT:[id number] presented with SOB. BNP elevated to 1600s. CXR with pulmonary edema and small right pleural effusion. Echo with EF 45-50% with right ventricle dysfunction. Initially diuresed with IV lasix, however this was stopped 01/26/2019 due to diarrhea, poor po intake, and elevated Cr. Now with  slight worsening of LE edema - Will resume lasix 40mg  daily today  - Continue to monitor strict I&Os and daily weights - Continue metoprolol succinate  3. Chest pain in patient with CAD s/p CABG in 2018:HsT 791>718 on labs yesterday, up from 111>113 on admission. Patient did not recall complaints of chest pain. EKG was without changes from previous with chronic ST-T wave abnormalities. Reported to have TTP of chest wall - Continue to monitor for recurrent chest pain  4. Mechanical MVR:hx rheumatic fever. S/p valvuloplasty at [facility] in 2008, then mechanical MVR at the time of CABG and MAZE in 2018. INR therapeutic at 3.0 today. She is s/p Vitamin K 9/22 for management of INR 7.7 this admission.  - Continue coumadin per pharmacy  5. Possible GQQ:[id number] noted overnight - Consider sleep study outpatient  6. AKI:Cr peaked at  1.78, stable at 1.52 today.  - Continue to monitor   7. LLE cellulitis:On antibiotics. Vascular curbsided to review LLE [place] who felt fluid in posterior calf was c/w edema.  - Continue management per primary team  For questions or updates, please contact [place] Please consult [email or web address] for contact info under Cardiology/STEMI.      Signed, Abigail Butts, PA-C  02/02/2019, 9:46 AM   [phone]

## 2019-02-02 NOTE — Progress Notes (Signed)
PROGRESS NOTE    Marie Malone  ATF:[id number] DOB: Mar 09, 1951 DOA: 02/03/2019 PCP: Debbrah Alar, NP   Brief Narrative:  Marie Eischeid Lipfordis a 68 y.o. 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 Patient states over the last 2 weeks patient has been getting progressively short of breath on exertion. Denies any chest pain fever chills or productive cough. Patient's primary care physician advised her to increase her Lasix twice daily which she is supposed to start today. Because of worsening shortness of breath patient came to the ER. EMS was called and patient initially was placed on BiPAP. Patient also had complained of some left calf cramping.  ED Course:In the ER patient was hypoxic placed on BiPAP chest x-ray shows features consistent with CHF. Lab work show a BNP of 1699, sodium 139 potassium 4.2 creatinine 1.1 WBC count of 7.6 hemoglobin 12.6 platelets 82 and EKG was showing A. fib rate at 106 bpm. Patient was given 60 mg IV Lasix following which patient was able to be weaned off BiPAP and presently is on 5 L oxygen. Patient admitted for further management of acute CHF. COVID-19 test was negative. On exam patient has good peripheral pulses with some edema in the lower extremity.  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   S/P CABG x 1   Acute on chronic systolic CHF (congestive heart failure) (HCC)   Atrial fibrillation, chronic   Cellulitis of left leg   Acute renal failure (HCC)   Primary biliary cirrhosis (HCC)   Chest pain at rest   H/O mitral valve replacement with  mechanical valve   Atypical atrial flutter (HCC)  Acute respiratory failure with hypoxia -Not on home O2 -Currently on room air  Chest pain acute started 9/21  Elevated Troponin - Patient borderline hypotensive with MAP 60-63 - Obtain 12-lead EKG; atrial flutter, nonspecific ST-T wave changes no changes from previous EKG - Troponin high-sensitivity 791 -> 718 - Albumin 50 g - Normal saline bolus 528ml - BMP, Mg WNL - BP improved, after albumin and normal saline bolus. - cards following and aware  Acute on chronic systolic CHF -Strict in and out +3.1 L (inaccurate) -Daily weight           01/29/19 0508     64.6 kg  - EF 40 to 45% see echocardiogram results below - Per cardiology discontinue Lasix, ARB, and K-Dur -9/20 decreased metoprolol XR 12.5 mg daily (patient's BP on the hypotensive side) - Cardiology c/s - appreciate recs - will restart lasix per cards - Consider DCCV prior to d/c (vs TEE/DCCV if pt INR becomes subtherapeutic) - per cards, she does not have to remain in hospital for this if otherwise stable for discharge  Mechanical mitral valve - Continue warfarin, monitor INR - down to 2.7 today -> repeat this PM and start heparin if falls below 2.5 (discussed with pharmacy)     -Therapeutic INR goal 2.5-3.5 -9/23 INR subtherapeutic 2.1 on 9/23 PM.  Today is 3.0 therapeutic.  Heparin has been d/c'd. - s/p reversal for rising INR on 9/22  Atrial fibrillation chronic - Currently NSR - Anticoagulated for  her mechanical mitral valve  LLE cellulitis vs SIRS - Blood culture, urine culture NGTD  - leukocytosis is improving today  - follow procalcitonin (downtrending) - vanc/ceftriaxone - narrowed to ancef per ID - ID c/s due to slow to improve -> improved on ancef - recommending keflex at discharge - Patient also with loose stools (possibly from Ursodiol) however patient has been chronically on this medication. - lactoferrin negative and c diff negative -  pending GI path panel pending, fecal fat - See CHF and PBS   LLE Swelling - some concern for posterior knee bleed vs compartment syndrome yesterday - CT was not suggestive of hematoma, but did have marked skin thickening and subcutaneous edema - 9/21 L LE cellulitis slightly worse today, ? secondary to hypoperfusion with low BP -9/22 LLE fullness and pain increasing obtain CT LLE, given patient's supratherapeutic INR evaluate for bleeding and to left posterior knee joint and calf - LLE [place] from 9/22 read an notable for "significant fluid in posterior calf, unclear etiology" - discussed this with vascular to ensure this did not appear to be fluid collection and he noted appeared c/w edema - pt with good distal pulses, will hold off on vascular c/s  Acute renal failure (baseline Cr 1.0)  NAGMA Last Labs          Recent Labs  Lab 01/28/19 0441 01/28/19 1551 01/29/19 0527 01/29/19 1635 01/30/19 0327  CREATININE 1.64* 1.51* 1.78* 1.63* 1.72*    -creatinine relatively stable today - Most likely secondary to overdiuresis and diarrhea. - 9/20 obtain urine lites; calculate FeNa - Hold IVF for now - bicarb 17 today, will try oral - ABG pending  Primary biliary cirrhosis(PBC) - 9/22 restart Ursodial 300 mg BID.   - pt c/o itching, possibly related to cellulitis?  Diarrhea - 9/22 patient reports diarrhea to cardiology, though has denied diarrhea to me on several occasions. -Fecal fat pending -GI pathogen panel by PCR pending -Lactoferrin ending -Stool osmolality pending -Stool K/NA pending - See PBC.  Goals of care - On 9/21 place PT/OT request patient was able to ambulate independently at home prior to this hospitalization.  Due to the severity of the LLE cellulitis unsure patient will be able to discharge home.  DVT prophylaxis: warfarin, may need heparin bridge (INR goal 2.5-3.5) Code Status: full  Family Communication: none at bedside Disposition Plan: pending further  improvement - needs PT eval prior to d/c home  Consultants:   cardiology  Procedures:  LE [place] Summary: Right: There is no evidence of deep vein thrombosis in the lower extremity. However, portions of this examination were limited- see technologist comments above. Left: There is no evidence of deep vein thrombosis in the lower extremity. However, portions of this examination were limited- see technologist comments above. Significant fluid noted in the posterior calf, etiology unknown.   *See table(s) above for measurements and observations.  Electronically signed by Deitra Mayo MD on 01/30/2019 at 9:32:51 AM.  Echo IMPRESSIONS    1. Left ventricular ejection fraction, by visual estimation, is 45 to 50%. The left ventricle has mildly decreased function. Normal left ventricular size. Left ventricular septal wall thickness was normal. Normal left ventricular posterior wall  thickness. There is no left ventricular hypertrophy.  2. Indeterminate diastolic function due to mechanical mitral valve pattern of LV diastolic filling.  3. Right ventricular pressure overload and right ventricular volume overload.  4. Global right ventricle has moderately reduced systolic function.The right ventricular size is moderately enlarged. No increase  in right ventricular wall thickness.  5. Left atrial size was moderately dilated.  6. Right atrial size was severely dilated.  7. The mitral valve has been repaired/replaced. No evidence of mitral valve regurgitation. No evidence of mitral stenosis.  8. The tricuspid valve is normal in structure. Tricuspid valve regurgitation mild-moderate.  9. The aortic valve The aortic valve is normal in structure. Aortic valve regurgitation was not visualized by color flow Doppler. Structurally normal aortic valve, with no evidence of sclerosis or stenosis. 10. The pulmonic valve was normal in structure. Pulmonic valve regurgitation is mild by color flow Doppler. 11.  Moderately elevated pulmonary artery systolic pressure. 12. The inferior vena cava is dilated in size with <50% respiratory variability, suggesting right atrial pressure of 15 mmHg.  Antimicrobials:  Anti-infectives (From admission, onward)   Start     Dose/Rate Route Frequency Ordered Stop   02/01/19 2000  ceFAZolin (ANCEF) IVPB 2g/100 mL premix     2 g 200 mL/hr over 30 Minutes Intravenous Every 8 hours 02/01/19 1524     02/01/19 1300  vancomycin (VANCOCIN) IVPB 750 mg/150 ml premix  Status:  Discontinued     750 mg 150 mL/hr over 60 Minutes Intravenous Every 24 hours 01/31/19 1152 02/01/19 1519   01/31/19 1300  vancomycin (VANCOCIN) 1,250 mg in sodium chloride 0.9 % 250 mL IVPB     1,250 mg 166.7 mL/hr over 90 Minutes Intravenous  Once 01/31/19 1152 01/31/19 1448   01/27/19 0930  cefTRIAXone (ROCEPHIN) 2 g in sodium chloride 0.9 % 100 mL IVPB  Status:  Discontinued     2 g 200 mL/hr over 30 Minutes Intravenous Every 24 hours 01/27/19 0824 02/01/19 1519      Subjective: LLE pain improved today  Objective: Vitals:   02/02/19 0828 02/02/19 0957 02/02/19 1354 02/02/19 1356  BP:  119/65 93/66 111/63  Pulse: 92 100 90   Resp: (!) 22  19   Temp:   98.2 F (36.8 C)   TempSrc:   Oral Oral  SpO2: 98%  97% 98%  Weight:      Height:        Intake/Output Summary (Last 24 hours) at 02/02/2019 1800 Last data filed at 02/02/2019 0900 Gross per 24 hour  Intake 120 ml  Output -  Net 120 ml   Filed Weights   01/31/19 0456 02/01/19 0527 02/02/19 0558  Weight: 69.5 kg 69.5 kg 70.5 kg    Examination:  General: No acute distress. Cardiovascular: Heart sounds show a regular rate, and rhythm Lungs: Clear to auscultation bilaterally  Abdomen: Soft, nontender, nondistended Neurological: Alert and oriented 3. Moves all extremities 4. Cranial nerves II through XII grossly intact. Skin: Warm and dry. No rashes or lesions. Extremities: LLE swelling, ruptured blister      Data  Reviewed: I have personally reviewed following labs and imaging studies  CBC: Recent Labs  Lab 01/29/19 0527 01/30/19 0327 01/31/19 0917 02/01/19 0248 02/02/19 0342  WBC 13.2* 16.1* 18.7* 23.1* 19.9*  HGB 11.2* 10.2* 11.4* 10.9* 10.9*  HCT 34.7* 31.4* 33.6* 32.7* 32.8*  MCV 88.5 87.2 85.7 84.7 85.4  PLT 96* 109* 124* 140* 941*   Basic Metabolic Panel: Recent Labs  Lab 01/29/19 0527 01/29/19 1635 01/30/19 0327 01/31/19 0917 02/01/19 0248 02/02/19 0342  NA 136 136 135 134* 138 136  K 4.1 4.1 3.8 4.0 3.6 3.9  CL 106 107 106 105 107 105  CO2 19* 18* 17* 17* 20* 20*  GLUCOSE 105* 121* 94  131* 120* 106*  BUN 60* 59* 60* 60* 59* 62*  CREATININE 1.78* 1.63* 1.72* 1.56* 1.48* 1.52*  CALCIUM 8.5* 8.3* 8.4* 8.6* 8.4* 8.5*  MG 2.2 2.4 2.2  --  2.4 2.3   GFR: Estimated Creatinine Clearance: 33.1 mL/min (A) (by C-G formula based on SCr of 1.52 mg/dL (H)). Liver Function Tests: Recent Labs  Lab 01/29/19 0527 01/30/19 0327 01/31/19 0917 02/01/19 0248 02/02/19 0342  AST 38 34 38 34 39  ALT 14 13 14 13 15   ALKPHOS 66 75 89 90 80  BILITOT 1.6* 1.5* 1.5* 1.8* 2.0*  PROT 7.8 8.1 8.1 7.3 7.9  ALBUMIN 2.2* 2.8* 2.4* 2.2* 2.2*   No results for input(s): LIPASE, AMYLASE in the last 168 hours. No results for input(s): AMMONIA in the last 168 hours. Coagulation Profile: Recent Labs  Lab 01/30/19 0327 01/31/19 0328 01/31/19 1927 02/01/19 0248 02/02/19 0342  INR 7.7* 2.7* 2.1* 2.5* 3.0*   Cardiac Enzymes: No results for input(s): CKTOTAL, CKMB, CKMBINDEX, TROPONINI in the last 168 hours. BNP (last 3 results) No results for input(s): PROBNP in the last 8760 hours. HbA1C: No results for input(s): HGBA1C in the last 72 hours. CBG: No results for input(s): GLUCAP in the last 168 hours. Lipid Profile: No results for input(s): CHOL, HDL, LDLCALC, TRIG, CHOLHDL, LDLDIRECT in the last 72 hours. Thyroid Function Tests: No results for input(s): TSH, T4TOTAL, FREET4, T3FREE,  THYROIDAB in the last 72 hours. Anemia Panel: No results for input(s): VITAMINB12, FOLATE, FERRITIN, TIBC, IRON, RETICCTPCT in the last 72 hours. Sepsis Labs: Recent Labs  Lab 01/31/19 1927 02/01/19 0248 02/02/19 0342  PROCALCITON 2.09 2.37 1.97    Recent Results (from the past 240 hour(s))  SARS Coronavirus 2 [facility] order, Performed in [facility] hospital lab) Nasopharyngeal Nasopharyngeal Swab     Status: None   Collection Time: 01/28/2019 10:12 PM   Specimen: Nasopharyngeal Swab  Result Value Ref Range Status   SARS Coronavirus 2 NEGATIVE NEGATIVE Final    Comment: (NOTE) If result is NEGATIVE SARS-CoV-2 target nucleic acids are NOT DETECTED. The SARS-CoV-2 RNA is generally detectable in upper and lower  respiratory specimens during the acute phase of infection. The lowest  concentration of SARS-CoV-2 viral copies this assay can detect is 250  copies / mL. A negative result does not preclude SARS-CoV-2 infection  and should not be used as the sole basis for treatment or other  patient management decisions.  A negative result may occur with  improper specimen collection / handling, submission of specimen other  than nasopharyngeal swab, presence of viral mutation(s) within the  areas targeted by this assay, and inadequate number of viral copies  (<250 copies / mL). A negative result must be combined with clinical  observations, patient history, and epidemiological information. If result is POSITIVE SARS-CoV-2 target nucleic acids are DETECTED. The SARS-CoV-2 RNA is generally detectable in upper and lower  respiratory specimens dur ing the acute phase of infection.  Positive  results are indicative of active infection with SARS-CoV-2.  Clinical  correlation with patient history and other diagnostic information is  necessary to determine patient infection status.  Positive results do  not rule out bacterial infection or co-infection with other viruses. If result is  PRESUMPTIVE POSTIVE SARS-CoV-2 nucleic acids MAY BE PRESENT.   A presumptive positive result was obtained on the submitted specimen  and confirmed on repeat testing.  While 2019 novel coronavirus  (SARS-CoV-2) nucleic acids may be present in the submitted sample  additional confirmatory testing may be necessary for epidemiological  and / or clinical management purposes  to differentiate between  SARS-CoV-2 and other Sarbecovirus currently known to infect humans.  If clinically indicated additional testing with an alternate test  methodology [id number]) is advised. The SARS-CoV-2 RNA is generally  detectable in upper and lower respiratory sp ecimens during the acute  phase of infection. The expected result is Negative. Fact Sheet for Patients:  StrictlyIdeas.no Fact Sheet for Healthcare Providers: BankingDealers.co.za This test is not yet approved or cleared by the [place] FDA and has been authorized for detection and/or diagnosis of SARS-CoV-2 by FDA under an Emergency Use Authorization (EUA).  This EUA will remain in effect (meaning this test can be used) for the duration of the COVID-19 declaration under Section 564(b)(1) of the Act, 21 U.S.C. section 360bbb-3(b)(1), unless the authorization is terminated or revoked sooner. Performed at [facility], [place] [address] [place], [place] [address]   Culture, Urine     Status: None   Collection Time: 01/25/19  1:20 AM   Specimen: Urine, Catheterized  Result Value Ref Range Status   Specimen Description URINE, CATHETERIZED  Final   Special Requests   Final    NONE Performed at [facility], [place] [address] [place], [place] [address]    Culture   Final    Multiple bacterial morphotypes present, none predominant. Suggest appropriate recollection if clinically indicated.   Report Status 01/26/2019 FINAL  Final  Culture, blood (routine x 2)     Status: None   Collection  Time: 01/25/19  2:16 AM   Specimen: BLOOD RIGHT HAND  Result Value Ref Range Status   Specimen Description BLOOD RIGHT HAND  Final   Special Requests   Final    BOTTLES DRAWN AEROBIC AND ANAEROBIC Blood Culture adequate volume   Culture   Final    NO GROWTH 5 DAYS Performed at [facility], [address] [place], [place] [address]    Report Status 01/30/2019 FINAL  Final  Culture, blood (routine x 2)     Status: None   Collection Time: 01/25/19  2:16 AM   Specimen: BLOOD LEFT HAND  Result Value Ref Range Status   Specimen Description BLOOD LEFT HAND  Final   Special Requests   Final    BOTTLES DRAWN AEROBIC AND ANAEROBIC Blood Culture adequate volume   Culture   Final    NO GROWTH 5 DAYS Performed at [facility], [place] [address], [place], [place] [address]    Report Status 01/30/2019 FINAL  Final  C difficile quick scan w PCR reflex     Status: None   Collection Time: 01/26/19  6:33 PM   Specimen: STOOL  Result Value Ref Range Status   C Diff antigen NEGATIVE NEGATIVE Final   C Diff toxin NEGATIVE NEGATIVE Final   C Diff interpretation No C. difficile detected.  Final    Comment: Performed at [facility], [place] [address], [place],  [address]  Culture, blood (routine x 2)     Status: None   Collection Time: 01/26/19  8:10 PM   Specimen: BLOOD  Result Value Ref Range Status   Specimen Description BLOOD BLOOD RIGHT FOREARM  Final   Special Requests   Final    BOTTLES DRAWN AEROBIC AND ANAEROBIC Blood Culture adequate volume   Culture   Final    NO GROWTH 5 DAYS Performed at [facility],  [address] [place], [place] [address]    Report Status 01/31/2019 FINAL  Final  Culture, blood (routine x 2)     Status: None   Collection Time: 01/26/19  8:25 PM   Specimen: BLOOD LEFT HAND  Result Value Ref Range Status   Specimen Description BLOOD LEFT HAND  Final   Special Requests   Final    BOTTLES DRAWN AEROBIC ONLY Blood Culture adequate  volume   Culture   Final    NO GROWTH 5 DAYS Performed at [facility], [place] [address], [place], [place] [address]    Report Status 01/31/2019 FINAL  Final  Culture, Urine     Status: Abnormal   Collection Time: 01/28/19  6:40 PM   Specimen: Urine, Random  Result Value Ref Range Status   Specimen Description URINE, RANDOM  Final   Special Requests NONE  Final   Culture (A)  Final    <10,000 COLONIES/mL INSIGNIFICANT GROWTH Performed at [facility], [place] [address] [place], [place] [address]    Report Status 01/29/2019 FINAL  Final  Culture, blood (routine x 2)     Status: None (Preliminary result)   Collection Time: 02/01/19  8:50 AM   Specimen: BLOOD  Result Value Ref Range Status   Specimen Description BLOOD LEFT ANTECUBITAL  Final   Special Requests   Final    BOTTLES DRAWN AEROBIC AND ANAEROBIC Blood Culture adequate volume   Culture   Final    NO GROWTH 1 DAY Performed at [facility], [place] [address] [place], [place] [address]    Report Status PENDING  Incomplete  Culture, blood (routine x 2)     Status: None (Preliminary result)   Collection Time: 02/01/19  8:55 AM   Specimen: BLOOD RIGHT HAND  Result Value Ref Range Status   Specimen Description BLOOD RIGHT HAND  Final   Special Requests   Final    BOTTLES DRAWN AEROBIC AND ANAEROBIC Blood Culture adequate volume   Culture   Final    NO GROWTH 1 DAY Performed at [facility], [place] [address], [place], [place] [address]    Report Status PENDING  Incomplete         Radiology Studies: Dg Chest 2 View  Result Date: 02/01/2019 CLINICAL DATA:  Fever. EXAM: CHEST - 2 VIEW COMPARISON:  Radiographs of January 29, 2019. FINDINGS: Stable cardiomediastinal silhouette. Status post coronary artery bypass graft and cardiac valve repair. Atherosclerosis of thoracic aorta is noted. No pneumothorax or pleural effusion is noted. No acute pulmonary disease is noted. Bony thorax is unremarkable.  IMPRESSION: No active cardiopulmonary disease. Aortic Atherosclerosis (ICD10-I70.0). Electronically Signed   By: Marijo Conception M.D.   On: 02/01/2019 11:44        Scheduled Meds: . aspirin EC  81 mg Oral Daily  . atorvastatin  40 mg Oral q1800  . feeding supplement (ENSURE ENLIVE)  237 mL Oral BID BM  . furosemide  40 mg Oral Daily  . metoprolol succinate  12.5 mg Oral Daily  . sodium bicarbonate  650 mg Oral TID  . ursodiol  300 mg Oral BID  . warfarin  2 mg Oral ONCE-1800  . Warfarin - Pharmacist Dosing Inpatient   Does not apply q1800   Continuous Infusions: .  ceFAZolin (ANCEF) IV 2 g (02/02/19 0555)     LOS: 9 days    Time spent: over 70 min    Fayrene Helper, MD  [facility] Pager AMION  If 7PM-7AM, please contact night-coverage [email or web address] Password [facility] 02/02/2019, 6:00 PM

## 2019-02-02 NOTE — Progress Notes (Signed)
[facility] Infectious [facility]   Reason for visit: Follow up on cellulitis  Interval History: WBC improving, Tmax 100.0, she feels her leg is better, less pain, less swelling.  No associated rash, diarrhea.    Physical Exam: Constitutional:  Vitals:   02/02/19 0558 02/02/19 0828  BP: 108/64   Pulse: 96 92  Resp: 19 (!) 22  Temp: (!) 97.5 F (36.4 C)   SpO2: 96% 98%   patient appears in NAD Eyes: anicteric Respiratory: Normal respiratory effort; CTA B Cardiovascular: RRR MS: left leg elevated on pillow, less edema, less tenderness, similar warmth.  Decreased erythema.   Review of Systems: Constitutional: negative for fevers, chills, malaise and anorexia Integument/breast: negative for rash Neurological: negative for dizziness  Lab Results  Component Value Date   WBC 19.9 (H) 02/02/2019   HGB 10.9 (L) 02/02/2019   HCT 32.8 (L) 02/02/2019   MCV 85.4 02/02/2019   PLT 138 (L) 02/02/2019    Lab Results  Component Value Date   CREATININE 1.52 (H) 02/02/2019   BUN 62 (H) 02/02/2019   NA 136 02/02/2019   K 3.9 02/02/2019   CL 105 02/02/2019   CO2 20 (L) 02/02/2019    Lab Results  Component Value Date   ALT 15 02/02/2019   AST 39 02/02/2019   ALKPHOS 80 02/02/2019     Microbiology: Recent Results (from the past 240 hour(s))  SARS Coronavirus 2 [facility] order, Performed in [facility] hospital lab) Nasopharyngeal Nasopharyngeal Swab     Status: None   Collection Time: 01/16/2019 10:12 PM   Specimen: Nasopharyngeal Swab  Result Value Ref Range Status   SARS Coronavirus 2 NEGATIVE NEGATIVE Final    Comment: (NOTE) If result is NEGATIVE SARS-CoV-2 target nucleic acids are NOT DETECTED. The SARS-CoV-2 RNA is generally detectable in upper and lower  respiratory specimens during the acute phase of infection. The lowest  concentration of SARS-CoV-2 viral copies this assay can detect is 250  copies / mL. A negative result does not preclude SARS-CoV-2  infection  and should not be used as the sole basis for treatment or other  patient management decisions.  A negative result may occur with  improper specimen collection / handling, submission of specimen other  than nasopharyngeal swab, presence of viral mutation(s) within the  areas targeted by this assay, and inadequate number of viral copies  (<250 copies / mL). A negative result must be combined with clinical  observations, patient history, and epidemiological information. If result is POSITIVE SARS-CoV-2 target nucleic acids are DETECTED. The SARS-CoV-2 RNA is generally detectable in upper and lower  respiratory specimens dur ing the acute phase of infection.  Positive  results are indicative of active infection with SARS-CoV-2.  Clinical  correlation with patient history and other diagnostic information is  necessary to determine patient infection status.  Positive results do  not rule out bacterial infection or co-infection with other viruses. If result is PRESUMPTIVE POSTIVE SARS-CoV-2 nucleic acids MAY BE PRESENT.   A presumptive positive result was obtained on the submitted specimen  and confirmed on repeat testing.  While 2019 novel coronavirus  (SARS-CoV-2) nucleic acids may be present in the submitted sample  additional confirmatory testing may be necessary for epidemiological  and / or clinical management purposes  to differentiate between  SARS-CoV-2 and other Sarbecovirus currently known to infect humans.  If clinically indicated additional testing with an alternate test  methodology [phone]) is advised. The SARS-CoV-2 RNA is generally  detectable in upper and lower respiratory sp ecimens during the acute  phase of infection. The expected result is Negative. Fact Sheet for Patients:  StrictlyIdeas.no Fact Sheet for Healthcare Providers: BankingDealers.co.za This test is not yet approved or cleared by the [place]  FDA and has been authorized for detection and/or diagnosis of SARS-CoV-2 by FDA under an Emergency Use Authorization (EUA).  This EUA will remain in effect (meaning this test can be used) for the duration of the COVID-19 declaration under Section 564(b)(1) of the Act, 21 U.S.C. section 360bbb-3(b)(1), unless the authorization is terminated or revoked sooner. Performed at [facility], [place] [address], [place], [place] [address]   Culture, Urine     Status: None   Collection Time: 01/25/19  1:20 AM   Specimen: Urine, Catheterized  Result Value Ref Range Status   Specimen Description URINE, CATHETERIZED  Final   Special Requests   Final    NONE Performed at [facility], [place] [address], [place], [place] [address]    Culture   Final    Multiple bacterial morphotypes present, none predominant. Suggest appropriate recollection if clinically indicated.   Report Status 01/26/2019 FINAL  Final  Culture, blood (routine x 2)     Status: None   Collection Time: 01/25/19  2:16 AM   Specimen: BLOOD RIGHT HAND  Result Value Ref Range Status   Specimen Description BLOOD RIGHT HAND  Final   Special Requests   Final    BOTTLES DRAWN AEROBIC AND ANAEROBIC Blood Culture adequate volume   Culture   Final    NO GROWTH 5 DAYS Performed at [facility], [address] [place], [place] [address]    Report Status 01/30/2019 FINAL  Final  Culture, blood (routine x 2)     Status: None   Collection Time: 01/25/19  2:16 AM   Specimen: BLOOD LEFT HAND  Result Value Ref Range Status   Specimen Description BLOOD LEFT HAND  Final   Special Requests   Final    BOTTLES DRAWN AEROBIC AND ANAEROBIC Blood Culture adequate volume   Culture   Final    NO GROWTH 5 DAYS Performed at [facility], [place] [address], [place], [place] [address]    Report Status 01/30/2019 FINAL  Final  C difficile quick scan w PCR reflex     Status: None   Collection Time: 01/26/19  6:33 PM   Specimen: STOOL    Result Value Ref Range Status   C Diff antigen NEGATIVE NEGATIVE Final   C Diff toxin NEGATIVE NEGATIVE Final   C Diff interpretation No C. difficile detected.  Final    Comment: Performed at [facility], [place] [address] [place], [place] [address]  Culture, blood (routine x 2)     Status: None   Collection Time: 01/26/19  8:10 PM   Specimen: BLOOD  Result Value Ref Range Status   Specimen Description BLOOD BLOOD RIGHT FOREARM  Final   Special Requests   Final    BOTTLES DRAWN AEROBIC AND ANAEROBIC Blood Culture adequate volume   Culture   Final    NO GROWTH 5 DAYS Performed at [facility], [place] [address], [place], [place] [address]    Report Status 01/31/2019 FINAL  Final  Culture, blood (routine x 2)     Status: None   Collection Time: 01/26/19  8:25 PM   Specimen: BLOOD LEFT HAND  Result Value Ref Range Status  Specimen Description BLOOD LEFT HAND  Final   Special Requests   Final    BOTTLES DRAWN AEROBIC ONLY Blood Culture adequate volume   Culture   Final    NO GROWTH 5 DAYS Performed at [facility], [address] [place], [place] [address]    Report Status 01/31/2019 FINAL  Final  Culture, Urine     Status: Abnormal   Collection Time: 01/28/19  6:40 PM   Specimen: Urine, Random  Result Value Ref Range Status   Specimen Description URINE, RANDOM  Final   Special Requests NONE  Final   Culture (A)  Final    <10,000 COLONIES/mL INSIGNIFICANT GROWTH Performed at [facility], [place] [address] [place], [place] [address]    Report Status 01/29/2019 FINAL  Final    Impression/Plan:  1. Cellulitis - slow improvement but overall better.  Can continue with cefazolin and ok to switch to oral cephalexin 500 mg 4 times per day at discharge for 5-7 days more to assure resolution.    2.  Leukocytosis - improved and from #1.    I will sign off, call with any questions.

## 2019-02-02 NOTE — Consult Note (Signed)
[facility] Nurse wound consult note Reason for Consult:Chronic venous stasis disease with skin changes to bilateral lower legs anterior aspect.  Scarring present but intact skin without return of melanin.  Left posterior lower leg with nonintact lesion, consistent with a ruptured blister.  Will add Xeroform gauze M/w/F by bedside staff.  Wound type:venous insufficiency with minimal edema noted to legs. Patient has not worn any compression in years.  Previously has worn removable compression hose Pressure Injury POA: NA Measurement: 4 cm x 2 cm ruptured blister, oozing  Wound PJK:DTOI flap present.   Drainage (amount, consistency, odor) serosanguinous  No odor Periwound:chronic skin changes anterior leg Dressing procedure/placement/frequency:Cleanse left posterior leg with NS and pat dry.  Apply Xeroform gauze to wound bed.  Secure with dry dressing and gauze.  Change M/W/F Will not follow at this time.  Please re-consult if needed.  Domenic Moras MSN, RN, FNP-BC CWON Wound, Ostomy, Continence Nurse Pager [phone]

## 2019-02-02 NOTE — Progress Notes (Signed)
ANTICOAGULATION CONSULT NOTE  Pharmacy Consult for warfarin, heparin  Indication: atrial fibrillation and mechanical MVR  Vital Signs: Temp: 97.5 F (36.4 C) (09/25 0558) Temp Source: Oral (09/25 0558) BP: 119/65 (09/25 0957) Pulse Rate: 100 (09/25 0957)  Labs: Recent Labs    01/31/19 0917 01/31/19 1927 02/01/19 0248 02/02/19 0342  HGB 11.4*  --  10.9* 10.9*  HCT 33.6*  --  32.7* 32.8*  PLT 124*  --  140* 138*  LABPROT  --  23.3* 26.8* 30.7*  INR  --  2.1* 2.5* 3.0*  HEPARINUNFRC  --   --  <0.10*  --   CREATININE 1.56*  --  1.48* 1.52*    Estimated Creatinine Clearance: 33.1 mL/min (A) (by C-G formula based on SCr of 1.52 mg/dL (H)).  Assessment: 68 y.o. female admitted with CHF/SOB, h/o Afib and mechanical MVR, to continue warfarin. PTA regimen is 4 mg daily. INR has been elevated likely due to vitamin K deficiency. Last dose of warfarin was on 9/19. May need a lower dose of warfarin  -INR= 2.5 (down from 7.7 after 1mg  IV vitamin K on 9/22)  Outpatient INR goal noted to be 2-3. And the goal was changed to 2.5-3.5 this admission   Goal of Therapy:  INR 2.5-3.5 Monitor platelets by anticoagulation protocol: Yes   Plan:  -Coumadin 2mg  po today (due to INR rate of rise) -Daily INR   Thank you for involving pharmacy in this patient's care.  **Pharmacist phone directory can be found on [email or web address] listed under [facility]**

## 2019-02-03 LAB — COMPREHENSIVE METABOLIC PANEL
ALT: 10 U/L (ref 0–44)
AST: 40 U/L (ref 15–41)
Albumin: 2.2 g/dL — ABNORMAL LOW (ref 3.5–5.0)
Alkaline Phosphatase: 85 U/L (ref 38–126)
Anion gap: 12 (ref 5–15)
BUN: 65 mg/dL — ABNORMAL HIGH (ref 8–23)
CO2: 21 mmol/L — ABNORMAL LOW (ref 22–32)
Calcium: 8.4 mg/dL — ABNORMAL LOW (ref 8.9–10.3)
Chloride: 100 mmol/L (ref 98–111)
Creatinine, Ser: 1.66 mg/dL — ABNORMAL HIGH (ref 0.44–1.00)
GFR calc Af Amer: 37 mL/min — ABNORMAL LOW (ref >60)
GFR calc non Af Amer: 32 mL/min — ABNORMAL LOW (ref >60)
Glucose, Bld: 114 mg/dL — ABNORMAL HIGH (ref 70–99)
Potassium: 4.3 mmol/L (ref 3.5–5.1)
Sodium: 133 mmol/L — ABNORMAL LOW (ref 135–145)
Total Bilirubin: 1.4 mg/dL — ABNORMAL HIGH (ref 0.3–1.2)
Total Protein: 8.2 g/dL — ABNORMAL HIGH (ref 6.5–8.1)

## 2019-02-03 LAB — MAGNESIUM: Magnesium: 2.3 mg/dL (ref 1.7–2.4)

## 2019-02-03 LAB — CBC
HCT: 32.4 % — ABNORMAL LOW (ref 36.0–46.0)
Hemoglobin: 10.9 g/dL — ABNORMAL LOW (ref 12.0–15.0)
MCH: 28.5 pg (ref 26.0–34.0)
MCHC: 33.6 g/dL (ref 30.0–36.0)
MCV: 84.8 fL (ref 80.0–100.0)
Platelets: 147 10*3/uL — ABNORMAL LOW (ref 150–400)
RBC: 3.82 MIL/uL — ABNORMAL LOW (ref 3.87–5.11)
RDW: 16.4 % — ABNORMAL HIGH (ref 11.5–15.5)
WBC: 16.2 10*3/uL — ABNORMAL HIGH (ref 4.0–10.5)
nRBC: 0.2 % (ref 0.0–0.2)

## 2019-02-03 LAB — PROTIME-INR
INR: 4.5 (ref 0.8–1.2)
Prothrombin Time: 42 seconds — ABNORMAL HIGH (ref 11.4–15.2)

## 2019-02-03 LAB — FECAL FAT, QUALITATIVE
Fat Qual Neutral, Stl: NORMAL
Fat Qual Total, Stl: NORMAL

## 2019-02-03 MED ORDER — OXYCODONE HCL 5 MG PO TABS
5.0000 mg | ORAL_TABLET | ORAL | Status: DC | PRN
  Administered 2019-02-03 – 2019-02-17 (×15): 5 mg via ORAL
  Filled 2019-02-03 (×14): qty 1

## 2019-02-03 MED ORDER — MORPHINE SULFATE (PF) 2 MG/ML IV SOLN
2.0000 mg | INTRAVENOUS | Status: DC | PRN
  Administered 2019-02-06 – 2019-02-10 (×2): 2 mg via INTRAVENOUS
  Filled 2019-02-03 (×2): qty 1

## 2019-02-03 MED ORDER — OXYCODONE HCL 5 MG PO TABS
10.0000 mg | ORAL_TABLET | ORAL | Status: DC | PRN
  Administered 2019-02-03 – 2019-02-14 (×4): 10 mg via ORAL
  Filled 2019-02-03 (×6): qty 2

## 2019-02-03 MED ORDER — ACETAMINOPHEN 500 MG PO TABS
1000.0000 mg | ORAL_TABLET | Freq: Three times a day (TID) | ORAL | Status: AC
  Administered 2019-02-03 – 2019-02-06 (×9): 1000 mg via ORAL
  Filled 2019-02-03 (×9): qty 2

## 2019-02-03 NOTE — Progress Notes (Addendum)
ANTICOAGULATION CONSULT NOTE  Pharmacy Consult for warfarin, heparin  Indication: atrial fibrillation and mechanical MVR  Vital Signs: Temp: 99.4 F (37.4 C) (09/26 0328) Temp Source: Oral (09/26 0328) BP: 109/65 (09/26 0328) Pulse Rate: 97 (09/26 0328)  Labs: Recent Labs    02/01/19 0248 02/02/19 0342 02/03/19 0323  HGB 10.9* 10.9* 10.9*  HCT 32.7* 32.8* 32.4*  PLT 140* 138* 147*  LABPROT 26.8* 30.7* 42.0*  INR 2.5* 3.0* 4.5*  HEPARINUNFRC <0.10*  --   --   CREATININE 1.48* 1.52* 1.66*    Estimated Creatinine Clearance: 30.4 mL/min (A) (by C-G formula based on SCr of 1.66 mg/dL (H)).  Assessment: 68 y.o. female admitted with CHF/SOB, h/o Afib and mechanical MVR, to continue warfarin. PTA regimen is 4 mg daily. INR has been elevated likely due to vitamin K deficiency.1mg  IV vitamin K given on 9/22.  Outpatient INR goal noted to be 2-3. And the goal was changed to 2.5-3.5 this admission  INR today is supratherapeutic at 4.5. Her H&H is stable at 10.9/32.4, plts wnl.   Of note, both cefazolin and cephalexin can increase the anticoagulant effect of warfarin  Goal of Therapy:  INR 2.5-3.5 Monitor platelets by anticoagulation protocol: Yes   Plan:  -Will hold today's warfarin dose  -Daily INR and CBC - monitor for signs of bleeding      Thank you,   Eddie Candle, PharmD PGY-1 Pharmacy Resident   Please check amion for clinical pharmacist contact number

## 2019-02-03 NOTE — Progress Notes (Signed)
    Dr Blenda Mounts note from 9/22 reviewed, possible DCCV for aflutter once multiple medical issues resolved. No additional recs over the weekend.      Merrily Pew, MD  02/03/2019, 8:29 AM

## 2019-02-03 NOTE — Evaluation (Addendum)
Physical Therapy Evaluation Patient Details Name: MIONNA ADVINCULA MRN: [id number] DOB: Mar 12, 1951 Today's Date: 02/03/2019   History of Present Illness  JENNY LAI is a 68 y.o. 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   Work up includes acute respiratory failure with hypoxia, A/C CHF, and L LE pain due to cellulitis vs SIRS.  Clinical Impression  Pt admitted with/for SOB due to A/C CHF, but most limited by L LE pain which is limiting all mobility and pt needing at least mod A..  Pt currently limited functionally due to the problems listed. ( See problems list.)   Pt will benefit from PT to maximize function and safety in order to get ready for next venue listed below.     Follow Up Recommendations SNF;Supervision/Assistance - 24 hour.  If pt significantly decreases may be able to D/C home with husband, but he will not be able to assist significantly.    Equipment Recommendations  Other (comment)(May need RW if pain persists)    Recommendations for Other Services       Precautions / Restrictions Precautions Precautions: Fall      Mobility  Bed Mobility Overal bed mobility: Needs Assistance Bed Mobility: Supine to Sit;Sit to Supine     Supine to sit: Mod assist Sit to supine: Mod assist   General bed mobility comments: Extra assist at trunk and L LE due to pain.  Transfers                 General transfer comment: Pt deferred transfers from EOB due to pain  Ambulation/Gait             General Gait Details: Pt unable today due to pain  Stairs            Wheelchair Mobility    Modified Rankin (Stroke Patients Only)       Balance Overall balance assessment: No apparent balance deficits (not formally assessed);Needs assistance   Sitting balance-Leahy Scale: Fair Sitting  balance - Comments: sat EOB without UE assist, not challenged due to pain                                     Pertinent Vitals/Pain Pain Assessment: Faces Faces Pain Scale: Hurts whole lot(almost a "10") Pain Location: L LE Pain Descriptors / Indicators: Grimacing;Throbbing;Sharp Pain Intervention(s): Limited activity within patient's tolerance;Monitored during session;Patient requesting pain meds-RN notified    Home Living Family/patient expects to be discharged to:: Private residence Living Arrangements: Spouse/significant other Available Help at Discharge: Family;Available 24 hours/day(husband can't assist much per pt,.) Type of Home: House Home Access: Stairs to enter Entrance Stairs-Rails: None Entrance Stairs-Number of Steps: 3 Home Layout: One level Home Equipment: Shower seat - built in      Prior Function Level of Independence: Independent               Hand Dominance        Extremity/Trunk Assessment   Upper Extremity Assessment Upper Extremity Assessment: Defer to OT evaluation    Lower Extremity Assessment Lower Extremity Assessment: RLE deficits/detail;LLE deficits/detail RLE Deficits / Details: functional, but generally weak in large muscle groups LLE Deficits / Details: Too painful to move actively today LLE: Unable to fully assess due to pain  Communication   Communication: No difficulties  Cognition Arousal/Alertness: Awake/alert;Lethargic Behavior During Therapy: Flat affect;WFL for tasks assessed/performed Overall Cognitive Status: No family/caregiver present to determine baseline cognitive functioning(Functional, but NT formally)                                        General Comments General comments (skin integrity, edema, etc.): VSS on RA;   Dr Florene Glen witnessed pt's pain and lack of mobility from EOB.    Exercises     Assessment/Plan    PT Assessment Patient needs continued PT services  PT  Problem List Decreased strength;Decreased activity tolerance;Decreased mobility;Pain;Decreased knowledge of use of DME       PT Treatment Interventions DME instruction;Gait training;Stair training;Functional mobility training;Therapeutic activities;Patient/family education    PT Goals (Current goals can be found in the Care Plan section)  Acute Rehab PT Goals Patient Stated Goal: Go home, but after pain decreased PT Goal Formulation: With patient Time For Goal Achievement: 02/17/19 Potential to Achieve Goals: Good    Frequency Min 3X/week   Barriers to discharge        Co-evaluation               AM-PAC PT "6 Clicks" Mobility  Outcome Measure Help needed turning from your back to your side while in a flat bed without using bedrails?: A Lot Help needed moving from lying on your back to sitting on the side of a flat bed without using bedrails?: A Lot Help needed moving to and from a bed to a chair (including a wheelchair)?: Total Help needed standing up from a chair using your arms (e.g., wheelchair or bedside chair)?: Total Help needed to walk in hospital room?: Total Help needed climbing 3-5 steps with a railing? : Total 6 Click Score: 8    End of Session   Activity Tolerance: Patient limited by pain Patient left: in bed;with call bell/phone within reach Nurse Communication: Mobility status PT Visit Diagnosis: Other abnormalities of gait and mobility (R26.89);Pain Pain - Right/Left: Left Pain - part of body: Leg    Time: 9937-1696 PT Time Calculation (min) (ACUTE ONLY): 25 min   Charges:   PT Evaluation $PT Eval Moderate Complexity: 1 Mod PT Treatments $Therapeutic Activity: 8-22 mins        02/03/2019  Donnella Sham, PT Acute Rehabilitation Services [phone]  (pager) [phone]  (office)  Tessie Fass Nasia Cannan 02/03/2019, 11:34 AM

## 2019-02-03 NOTE — Progress Notes (Signed)
PROGRESS NOTE    Marie Malone  UXL:[id number] DOB: 1950-10-01 DOA: 01/11/2019 PCP: Debbrah Alar, NP   Brief Narrative:  Marie Goswick Lipfordis a 68 y.o. 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 Patient states over the last 2 weeks patient has been getting progressively short of breath on exertion. Denies any chest pain fever chills or productive cough. Patient's primary care physician advised her to increase her Lasix twice daily which she is supposed to start today. Because of worsening shortness of breath patient came to the ER. EMS was called and patient initially was placed on BiPAP. Patient also had complained of some left calf cramping.  ED Course:In the ER patient was hypoxic placed on BiPAP chest x-ray shows features consistent with CHF. Lab work show a BNP of 1699, sodium 139 potassium 4.2 creatinine 1.1 WBC count of 7.6 hemoglobin 12.6 platelets 82 and EKG was showing A. fib rate at 106 bpm. Patient was given 60 mg IV Lasix following which patient was able to be weaned off BiPAP and presently is on 5 L oxygen. Patient admitted for further management of acute CHF. COVID-19 test was negative. On exam patient has good peripheral pulses with some edema in the lower extremity.  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   S/P CABG x 1   Acute on chronic systolic CHF (congestive heart failure) (HCC)   Atrial fibrillation, chronic   Cellulitis of left leg   Acute renal failure (HCC)   Primary biliary cirrhosis (HCC)   Chest pain at rest   H/O mitral valve replacement with  mechanical valve   Atypical atrial flutter (HCC)  Acute respiratory failure with hypoxia -Not on home O2 -Currently on room air  Chest pain acute started 9/21  Elevated Troponin - Patient borderline hypotensive with MAP 60-63 - Obtain 12-lead EKG; atrial flutter, nonspecific ST-T wave changes no changes from previous EKG - Troponin high-sensitivity 791 -> 718 - Albumin 50 g - Normal saline bolus 581ml - BMP, Mg WNL - BP improved, after albumin and normal saline bolus. - cards following and aware  Acute on chronic systolic CHF -Strict in and out +3.1 L (inaccurate) -Daily weight           01/29/19 0508     64.6 kg  - EF 40 to 45% see echocardiogram results below - Per cardiology discontinue Lasix, ARB, and K-Dur -9/20 decreased metoprolol XR 12.5 mg daily (patient's BP on the hypotensive side) - Cardiology c/s - appreciate recs - will restart lasix per cards - Consider DCCV prior to d/c (vs TEE/DCCV if pt INR becomes subtherapeutic) - per cards, she does not have to remain in hospital for this if otherwise stable for discharge  Mechanical mitral valve - Continue warfarin, monitor INR     -Therapeutic INR goal 2.5-3.5 -9/23 INR subtherapeutic 2.1 on 9/23 PM.  Supratherapeutic today.  Warfarin on hold today.  Ancef can increase anticoagulant effect of warfarin.  Appreciate pharmacy assistance. - s/p reversal for rising INR on 9/22  Atrial fibrillation chronic - Currently NSR - Anticoagulated for her mechanical mitral valve  LLE cellulitis - Blood  culture, urine culture NGTD  - leukocytosis is improving today  - follow procalcitonin (downtrending) - vanc/ceftriaxone - narrowed to ancef per ID - ID c/s due to slow to improve -> improved on ancef - recommending keflex at discharge - Patient also with loose stools (possibly from Ursodiol) however patient has been chronically on this medication. - lactoferrin negative and c diff negative - pending GI path panel pending,  fecal fat - See CHF and PBS  - she has significant LLE pain when attempting to stand - will adjust pain regimen, continue abx, and follow  LLE Swelling - some concern for posterior knee bleed vs compartment syndrome yesterday - CT was not suggestive of hematoma, but did have marked skin thickening and subcutaneous edema - 9/21 L LE cellulitis slightly worse today, ? secondary to hypoperfusion with low BP -9/22 LLE fullness and pain increasing obtain CT LLE, given patient's supratherapeutic INR evaluate for bleeding and to left posterior knee joint and calf - LLE [place] from 9/22 read an notable for "significant fluid in posterior calf, unclear etiology" - discussed this with vascular to ensure this did not appear to be fluid collection and he noted appeared c/w edema - pt with good distal pulses, will hold off on vascular c/s  Acute renal failure (baseline Cr 1.0)  NAGMA Last Labs          Recent Labs  Lab 01/28/19 0441 01/28/19 1551 01/29/19 0527 01/29/19 1635 01/30/19 0327  CREATININE 1.64* 1.51* 1.78* 1.63* 1.72*    -creatinine 1.66 today, follow with diuresis - Most likely secondary to overdiuresis and diarrhea. - 9/20 obtain urine lites; calculate FeNa - Hold IVF for now - bicarb 17 today, will try oral - ABG pending  Primary biliary cirrhosis(PBC) - 9/22 restart Ursodial 300 mg BID.   - pt c/o itching, possibly related to cellulitis?  Diarrhea - 9/22 patient reports diarrhea to cardiology, though has denied diarrhea to me on several occasions. -Fecal fat pending -GI pathogen panel by PCR pending -Lactoferrin ending -Stool osmolality pending -Stool K/NA pending - See PBC.  Goals of care - On 9/21 place PT/OT request patient was able to ambulate independently at home prior to this hospitalization.  Due to the severity of the LLE cellulitis unsure patient will be able to discharge home.  DVT prophylaxis: warfarin (INR supratherapeutic today) Code Status: full   Family Communication: none at bedside Disposition Plan: pending further improvement - needs PT eval prior to d/c home  Consultants:   cardiology  Procedures:  LE [place] Summary: Right: There is no evidence of deep vein thrombosis in the lower extremity. However, portions of this examination were limited- see technologist comments above. Left: There is no evidence of deep vein thrombosis in the lower extremity. However, portions of this examination were limited- see technologist comments above. Significant fluid noted in the posterior calf, etiology unknown.   *See table(s) above for measurements and observations.  Electronically signed by Deitra Mayo MD on 01/30/2019 at 9:32:51 AM.  Echo IMPRESSIONS    1. Left ventricular ejection fraction, by visual estimation, is 45 to 50%. The left ventricle has mildly decreased function. Normal left ventricular size. Left ventricular septal wall thickness was normal. Normal left ventricular posterior wall  thickness. There is no left ventricular hypertrophy.  2. Indeterminate diastolic function due to mechanical mitral valve pattern of LV diastolic filling.  3. Right ventricular pressure overload and right ventricular volume overload.  4. Global right ventricle has moderately reduced systolic function.The right ventricular  size is moderately enlarged. No increase in right ventricular wall thickness.  5. Left atrial size was moderately dilated.  6. Right atrial size was severely dilated.  7. The mitral valve has been repaired/replaced. No evidence of mitral valve regurgitation. No evidence of mitral stenosis.  8. The tricuspid valve is normal in structure. Tricuspid valve regurgitation mild-moderate.  9. The aortic valve The aortic valve is normal in structure. Aortic valve regurgitation was not visualized by color flow Doppler. Structurally normal aortic valve, with no evidence of sclerosis or stenosis. 10. The pulmonic valve was normal in  structure. Pulmonic valve regurgitation is mild by color flow Doppler. 11. Moderately elevated pulmonary artery systolic pressure. 12. The inferior vena cava is dilated in size with <50% respiratory variability, suggesting right atrial pressure of 15 mmHg.  Antimicrobials:  Anti-infectives (From admission, onward)   Start     Dose/Rate Route Frequency Ordered Stop   02/01/19 2000  ceFAZolin (ANCEF) IVPB 2g/100 mL premix     2 g 200 mL/hr over 30 Minutes Intravenous Every 8 hours 02/01/19 1524     02/01/19 1300  vancomycin (VANCOCIN) IVPB 750 mg/150 ml premix  Status:  Discontinued     750 mg 150 mL/hr over 60 Minutes Intravenous Every 24 hours 01/31/19 1152 02/01/19 1519   01/31/19 1300  vancomycin (VANCOCIN) 1,250 mg in sodium chloride 0.9 % 250 mL IVPB     1,250 mg 166.7 mL/hr over 90 Minutes Intravenous  Once 01/31/19 1152 01/31/19 1448   01/27/19 0930  cefTRIAXone (ROCEPHIN) 2 g in sodium chloride 0.9 % 100 mL IVPB  Status:  Discontinued     2 g 200 mL/hr over 30 Minutes Intravenous Every 24 hours 01/27/19 0824 02/01/19 1519      Subjective: C/o significant LLE pain when trying to stand  Objective: Vitals:   02/02/19 1930 02/02/19 2101 02/03/19 0328 02/03/19 1300  BP:  109/66 109/65 112/68  Pulse: 91 91 97 87  Resp: 15 17 (!) 21 20  Temp:  97.8 F (36.6 C) 99.4 F (37.4 C) 98.7 F (37.1 C)  TempSrc:  Oral Oral Oral  SpO2: 99% 93% 97% 98%  Weight:   71.2 kg   Height:       No intake or output data in the 24 hours ending 02/03/19 1753 Filed Weights   02/01/19 0527 02/02/19 0558 02/03/19 0328  Weight: 69.5 kg 70.5 kg 71.2 kg    Examination:  General: No acute distress. Cardiovascular: Heart sounds show a regular rate, and rhythm.  Lungs: Clear to auscultation bilaterally  Abdomen: Soft, nontender, nondistended  Neurological: Alert and oriented 3. Moves all extremities 4. Cranial nerves II through XII grossly intact. Skin: Warm and dry. No rashes or lesions.  Extremities: LLE edema with pain and ruptured blisters      Data Reviewed: I have personally reviewed following labs and imaging studies  CBC: Recent Labs  Lab 01/30/19 0327 01/31/19 0917 02/01/19 0248 02/02/19 0342 02/03/19 0323  WBC 16.1* 18.7* 23.1* 19.9* 16.2*  HGB 10.2* 11.4* 10.9* 10.9* 10.9*  HCT 31.4* 33.6* 32.7* 32.8* 32.4*  MCV 87.2 85.7 84.7 85.4 84.8  PLT 109* 124* 140* 138* 098*   Basic Metabolic Panel: Recent Labs  Lab 01/29/19 1635 01/30/19 0327 01/31/19 0917 02/01/19 0248 02/02/19 0342 02/03/19 0323  NA 136 135 134* 138 136 133*  K 4.1 3.8 4.0 3.6 3.9 4.3  CL 107 106 105 107 105 100  CO2 18* 17* 17* 20* 20* 21*  GLUCOSE 121*  94 131* 120* 106* 114*  BUN 59* 60* 60* 59* 62* 65*  CREATININE 1.63* 1.72* 1.56* 1.48* 1.52* 1.66*  CALCIUM 8.3* 8.4* 8.6* 8.4* 8.5* 8.4*  MG 2.4 2.2  --  2.4 2.3 2.3   GFR: Estimated Creatinine Clearance: 30.4 mL/min (A) (by C-G formula based on SCr of 1.66 mg/dL (H)). Liver Function Tests: Recent Labs  Lab 01/30/19 0327 01/31/19 0917 02/01/19 0248 02/02/19 0342 02/03/19 0323  AST 34 38 34 39 40  ALT 13 14 13 15 10   ALKPHOS 75 89 90 80 85  BILITOT 1.5* 1.5* 1.8* 2.0* 1.4*  PROT 8.1 8.1 7.3 7.9 8.2*  ALBUMIN 2.8* 2.4* 2.2* 2.2* 2.2*   No results for input(s): LIPASE, AMYLASE in the last 168 hours. No results for input(s): AMMONIA in the last 168 hours. Coagulation Profile: Recent Labs  Lab 01/31/19 0328 01/31/19 1927 02/01/19 0248 02/02/19 0342 02/03/19 0323  INR 2.7* 2.1* 2.5* 3.0* 4.5*   Cardiac Enzymes: No results for input(s): CKTOTAL, CKMB, CKMBINDEX, TROPONINI in the last 168 hours. BNP (last 3 results) No results for input(s): PROBNP in the last 8760 hours. HbA1C: No results for input(s): HGBA1C in the last 72 hours. CBG: No results for input(s): GLUCAP in the last 168 hours. Lipid Profile: No results for input(s): CHOL, HDL, LDLCALC, TRIG, CHOLHDL, LDLDIRECT in the last 72 hours. Thyroid  Function Tests: No results for input(s): TSH, T4TOTAL, FREET4, T3FREE, THYROIDAB in the last 72 hours. Anemia Panel: No results for input(s): VITAMINB12, FOLATE, FERRITIN, TIBC, IRON, RETICCTPCT in the last 72 hours. Sepsis Labs: Recent Labs  Lab 01/31/19 1927 02/01/19 0248 02/02/19 0342  PROCALCITON 2.09 2.37 1.97    Recent Results (from the past 240 hour(s))  Culture, Urine     Status: None   Collection Time: 01/25/19  1:20 AM   Specimen: Urine, Catheterized  Result Value Ref Range Status   Specimen Description URINE, CATHETERIZED  Final   Special Requests   Final    NONE Performed at [facility], [address], [place], [place] [address]    Culture   Final    Multiple bacterial morphotypes present, none predominant. Suggest appropriate recollection if clinically indicated.   Report Status 01/26/2019 FINAL  Final  Culture, blood (routine x 2)     Status: None   Collection Time: 01/25/19  2:16 AM   Specimen: BLOOD RIGHT HAND  Result Value Ref Range Status   Specimen Description BLOOD RIGHT HAND  Final   Special Requests   Final    BOTTLES DRAWN AEROBIC AND ANAEROBIC Blood Culture adequate volume   Culture   Final    NO GROWTH 5 DAYS Performed at [facility], [address], [place], [place] [address]    Report Status 01/30/2019 FINAL  Final  Culture, blood (routine x 2)     Status: None   Collection Time: 01/25/19  2:16 AM   Specimen: BLOOD LEFT HAND  Result Value Ref Range Status   Specimen Description BLOOD LEFT HAND  Final   Special Requests   Final    BOTTLES DRAWN AEROBIC AND ANAEROBIC Blood Culture adequate volume   Culture   Final    NO GROWTH 5 DAYS Performed at [facility], [place] [address], [place], [place] [address]    Report Status 01/30/2019 FINAL  Final  C difficile quick scan w PCR reflex     Status: None   Collection Time: 01/26/19  6:33 PM   Specimen:  STOOL  Result Value Ref Range Status   C Diff antigen NEGATIVE NEGATIVE  Final   C Diff toxin NEGATIVE NEGATIVE Final   C Diff interpretation No C. difficile detected.  Final    Comment: Performed at [facility], [place] [address] [place], [place] [address]  Culture, blood (routine x 2)     Status: None   Collection Time: 01/26/19  8:10 PM   Specimen: BLOOD  Result Value Ref Range Status   Specimen Description BLOOD BLOOD RIGHT FOREARM  Final   Special Requests   Final    BOTTLES DRAWN AEROBIC AND ANAEROBIC Blood Culture adequate volume   Culture   Final    NO GROWTH 5 DAYS Performed at [facility], [place] [address], [place], [place] [address]    Report Status 01/31/2019 FINAL  Final  Culture, blood (routine x 2)     Status: None   Collection Time: 01/26/19  8:25 PM   Specimen: BLOOD LEFT HAND  Result Value Ref Range Status   Specimen Description BLOOD LEFT HAND  Final   Special Requests   Final    BOTTLES DRAWN AEROBIC ONLY Blood Culture adequate volume   Culture   Final    NO GROWTH 5 DAYS Performed at [facility], [place] [address], [place], [place] [address]    Report Status 01/31/2019 FINAL  Final  Culture, Urine     Status: Abnormal   Collection Time: 01/28/19  6:40 PM   Specimen: Urine, Random  Result Value Ref Range Status   Specimen Description URINE, RANDOM  Final   Special Requests NONE  Final   Culture (A)  Final    <10,000 COLONIES/mL INSIGNIFICANT GROWTH Performed at [facility], [place] [address], [place], [place] [address]    Report Status 01/29/2019 FINAL  Final  Culture, blood (routine x 2)     Status: None (Preliminary result)   Collection Time: 02/01/19  8:50 AM   Specimen: BLOOD  Result Value Ref Range Status   Specimen Description BLOOD LEFT ANTECUBITAL  Final   Special Requests   Final    BOTTLES DRAWN AEROBIC AND ANAEROBIC Blood Culture adequate volume   Culture   Final    NO GROWTH 2 DAYS Performed at [facility], [place] [address], [place],  [address]    Report Status PENDING   Incomplete  Culture, blood (routine x 2)     Status: None (Preliminary result)   Collection Time: 02/01/19  8:55 AM   Specimen: BLOOD RIGHT HAND  Result Value Ref Range Status   Specimen Description BLOOD RIGHT HAND  Final   Special Requests   Final    BOTTLES DRAWN AEROBIC AND ANAEROBIC Blood Culture adequate volume   Culture   Final    NO GROWTH 2 DAYS Performed at [facility], [place] [address] [place],  [address]    Report Status PENDING  Incomplete         Radiology Studies: No results found.      Scheduled Meds: . acetaminophen  1,000 mg Oral Q8H  . aspirin EC  81 mg Oral Daily  . atorvastatin  40 mg Oral q1800  . feeding supplement (ENSURE ENLIVE)  237 mL Oral BID BM  . furosemide  40 mg Oral Daily  . metoprolol succinate  12.5 mg Oral Daily  . sodium bicarbonate  650 mg Oral TID  . ursodiol  300 mg Oral BID  .  Warfarin - Pharmacist Dosing Inpatient   Does not apply q1800   Continuous Infusions: .  ceFAZolin (ANCEF) IV 2 g (02/03/19 1548)     LOS: 10 days    Time spent: over 58 min    Marie Helper, MD [facility] Pager AMION  If 7PM-7AM, please contact night-coverage [email or web address] Password [facility] 02/03/2019, 5:53 PM

## 2019-02-04 LAB — GI PATHOGEN PANEL BY PCR, STOOL

## 2019-02-04 LAB — COMPREHENSIVE METABOLIC PANEL
ALT: 6 U/L (ref 0–44)
AST: 38 U/L (ref 15–41)
Albumin: 2.1 g/dL — ABNORMAL LOW (ref 3.5–5.0)
Alkaline Phosphatase: 75 U/L (ref 38–126)
Anion gap: 12 (ref 5–15)
BUN: 79 mg/dL — ABNORMAL HIGH (ref 8–23)
CO2: 21 mmol/L — ABNORMAL LOW (ref 22–32)
Calcium: 8.4 mg/dL — ABNORMAL LOW (ref 8.9–10.3)
Chloride: 100 mmol/L (ref 98–111)
Creatinine, Ser: 2.32 mg/dL — ABNORMAL HIGH (ref 0.44–1.00)
GFR calc Af Amer: 24 mL/min — ABNORMAL LOW (ref >60)
GFR calc non Af Amer: 21 mL/min — ABNORMAL LOW (ref >60)
Glucose, Bld: 108 mg/dL — ABNORMAL HIGH (ref 70–99)
Potassium: 4.4 mmol/L (ref 3.5–5.1)
Sodium: 133 mmol/L — ABNORMAL LOW (ref 135–145)
Total Bilirubin: 1.4 mg/dL — ABNORMAL HIGH (ref 0.3–1.2)
Total Protein: 8.3 g/dL — ABNORMAL HIGH (ref 6.5–8.1)

## 2019-02-04 LAB — URINALYSIS, ROUTINE W REFLEX MICROSCOPIC
Bilirubin Urine: NEGATIVE
Glucose, UA: NEGATIVE mg/dL
Hgb urine dipstick: NEGATIVE
Ketones, ur: 5 mg/dL — AB
Leukocytes,Ua: NEGATIVE
Nitrite: NEGATIVE
Protein, ur: 30 mg/dL — AB
Specific Gravity, Urine: 1.033 — ABNORMAL HIGH (ref 1.005–1.030)
pH: 5 (ref 5.0–8.0)

## 2019-02-04 LAB — HEMOGLOBIN AND HEMATOCRIT, BLOOD
HCT: 28.9 % — ABNORMAL LOW (ref 36.0–46.0)
Hemoglobin: 9.8 g/dL — ABNORMAL LOW (ref 12.0–15.0)

## 2019-02-04 LAB — CBC
HCT: 28.7 % — ABNORMAL LOW (ref 36.0–46.0)
Hemoglobin: 9.8 g/dL — ABNORMAL LOW (ref 12.0–15.0)
MCH: 29 pg (ref 26.0–34.0)
MCHC: 34.1 g/dL (ref 30.0–36.0)
MCV: 84.9 fL (ref 80.0–100.0)
Platelets: 127 10*3/uL — ABNORMAL LOW (ref 150–400)
RBC: 3.38 MIL/uL — ABNORMAL LOW (ref 3.87–5.11)
RDW: 16.6 % — ABNORMAL HIGH (ref 11.5–15.5)
WBC: 15.3 10*3/uL — ABNORMAL HIGH (ref 4.0–10.5)
nRBC: 0.1 % (ref 0.0–0.2)

## 2019-02-04 LAB — MAGNESIUM: Magnesium: 2.3 mg/dL (ref 1.7–2.4)

## 2019-02-04 LAB — PROTIME-INR
INR: 5.1 (ref 0.8–1.2)
Prothrombin Time: 45.9 seconds — ABNORMAL HIGH (ref 11.4–15.2)

## 2019-02-04 MED ORDER — CEFAZOLIN SODIUM-DEXTROSE 2-4 GM/100ML-% IV SOLN
2.0000 g | Freq: Two times a day (BID) | INTRAVENOUS | Status: DC
  Administered 2019-02-04 – 2019-02-05 (×2): 2 g via INTRAVENOUS
  Filled 2019-02-04 (×5): qty 100

## 2019-02-04 MED ORDER — LACTATED RINGERS IV SOLN
INTRAVENOUS | Status: DC
  Administered 2019-02-05: 01:00:00 via INTRAVENOUS

## 2019-02-04 MED ORDER — DIPHENHYDRAMINE HCL 25 MG PO CAPS
25.0000 mg | ORAL_CAPSULE | Freq: Four times a day (QID) | ORAL | Status: DC | PRN
  Administered 2019-02-04 – 2019-02-06 (×3): 25 mg via ORAL
  Filled 2019-02-04 (×3): qty 1

## 2019-02-04 MED ORDER — LACTATED RINGERS IV BOLUS
500.0000 mL | Freq: Once | INTRAVENOUS | Status: AC
  Administered 2019-02-04: 500 mL via INTRAVENOUS

## 2019-02-04 MED ORDER — DOCUSATE SODIUM 100 MG PO CAPS
100.0000 mg | ORAL_CAPSULE | Freq: Every day | ORAL | Status: DC | PRN
  Administered 2019-02-04 – 2019-02-05 (×2): 100 mg via ORAL
  Filled 2019-02-04 (×3): qty 1

## 2019-02-04 NOTE — Progress Notes (Addendum)
PROGRESS NOTE    Marie Malone  BMW:[id number] DOB: June 21, 1950 DOA: 01/14/2019 PCP: Debbrah Alar, NP   Brief Narrative:  Marie Vollmer Lipfordis a 68 y.o. 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 Patient states over the last 2 weeks patient has been getting progressively short of breath on exertion. Denies any chest pain fever chills or productive cough. Patient's primary care physician advised her to increase her Lasix twice daily which she is supposed to start today. Because of worsening shortness of breath patient came to the ER. EMS was called and patient initially was placed on BiPAP. Patient also had complained of some left calf cramping.  ED Course:In the ER patient was hypoxic placed on BiPAP chest x-ray shows features consistent with CHF. Lab work show a BNP of 1699, sodium 139 potassium 4.2 creatinine 1.1 WBC count of 7.6 hemoglobin 12.6 platelets 82 and EKG was showing A. fib rate at 106 bpm. Patient was given 60 mg IV Lasix following which patient was able to be weaned off BiPAP and presently is on 5 L oxygen. Patient admitted for further management of acute CHF. COVID-19 test was negative. On exam patient has good peripheral pulses with some edema in the lower extremity.  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   S/P CABG x 1   Acute on chronic systolic CHF (congestive heart failure) (HCC)   Atrial fibrillation, chronic   Cellulitis of left leg   Acute renal failure (HCC)   Primary biliary cirrhosis (HCC)   Chest pain at rest   H/O mitral valve replacement with  mechanical valve   Atypical atrial flutter (HCC)  LLE cellulitis - Blood culture, urine culture NGTD  - leukocytosis is continuing to improve - follow procalcitonin (downtrending) - vanc/ceftriaxone - narrowed to ancef per ID - ID c/s due to slow to improve -> improved on ancef - recommending keflex at discharge - Patient also with loose stools (possibly from Ursodiol) however patient has been chronically on this medication. - lactoferrin negative and c diff negative - pending GI path panel pending, fecal fat wnl - See CHF and PBS  - she has significant LLE pain when attempting to stand - will adjust pain regimen, continue abx, and follow - continued LE swelling, but improved warmth and pain - I think she's improving - discussed with nursing to elevated legs, no circumferential wrapping  LLE Swelling - suspect this is due to above - elevate LE above heart, continue to monitor - some concern for posterior knee bleed vs compartment syndrome -> but low suspicion for this based on imaging and exam with soft compartments - CT was not suggestive of hematoma, but did have marked skin thickening and subcutaneous edema - 9/21 L LE cellulitis slightly worse today, ? secondary to hypoperfusion with low BP -9/22 LLE fullness and pain increasing obtain CT LLE, given patient's supratherapeutic INR evaluate for bleeding and to left posterior knee joint and calf - LLE [place] from 9/22 read an notable for "significant fluid in posterior calf, unclear etiology" - discussed this with vascular to ensure this did not appear to be fluid collection and  he noted appeared c/w edema - pt with good distal pulses, will hold off on vascular c/s  Acute renal failure (baseline Cr 1.0)  NAGMA     -creatinine 2.32 today - this is after restarting diuresis  - will give gentle bolus, stop lasix and follow  - follow urinalysis - consider renal US if not improving - Most likely secondary to overdiuresis and diarrhea. -  hold bicarb and follow  Acute respiratory failure with hypoxia -Not on home O2 -Currently on room air  Chest pain acute started 9/21  Elevated Troponin - Patient borderline hypotensive with MAP 60-63 - Obtain 12-lead EKG; atrial flutter, nonspecific ST-T wave changes no changes from previous EKG - Troponin high-sensitivity 791 -> 718 - Albumin 50 g - Normal saline bolus 586ml - BMP, Mg WNL - BP improved, after albumin and normal saline bolus. - cards following and aware  Acute on chronic systolic CHF -Strict in and out +3.1 L (inaccurate) -Daily weight                  - EF 40 to 45% see echocardiogram results below - Per cardiology discontinue Lasix, ARB, and K-Dur -9/20 decreased metoprolol XR 12.5 mg daily (patient's BP on the hypotensive side) - Cardiology c/s - appreciate recs  - Consider DCCV prior to d/c (vs TEE/DCCV if pt INR becomes subtherapeutic) - per cards, she does not have to remain in hospital for this if otherwise stable for discharge  Mechanical mitral valve - Continue warfarin, monitor INR     -Therapeutic INR goal 2.5-3.5 -Supratherapeutic today.  Warfarin on hold today.  Ancef can increase anticoagulant effect of warfarin.  Appreciate pharmacy assistance. - s/p reversal for rising INR on 9/22  Atrial fibrillation chronic - Currently NSR - Anticoagulated for her mechanical mitral valve  Primary biliary cirrhosis(PBC) - 9/22 restart Ursodial 300 mg BID.   - pt c/o itching, possibly related to cellulitis? - benadryl for itching  Diarrhea - 9/22 patient reports diarrhea to cardiology, though has denied diarrhea to me on several occasions. -Fecal fat pending -GI pathogen panel by PCR negative -Lactoferrin negative -Stool osmolality pending -Stool K/NA pending - See PBC.  Anemia: Hb slightly downtrending, follow PM - no si/sx bleeding  Hypotension: mild, continue to monitor with IVF  Goals of care - On 9/21 place PT/OT request patient  was able to ambulate independently at home prior to this hospitalization.  Due to the severity of the LLE cellulitis unsure patient will be able to discharge home.  DVT prophylaxis: warfarin (INR supratherapeutic today) Code Status: full  Family Communication: none at bedside - daughter at bedside Disposition Plan: pending further improvement - needs PT eval prior to d/c home  Consultants:   cardiology  Procedures:  LE [place] Summary: Right: There is no evidence of deep vein thrombosis in the lower extremity. However, portions of this examination were limited- see technologist comments above. Left: There is no evidence of deep vein thrombosis in the lower extremity. However, portions of this examination were limited- see technologist comments above. Significant fluid noted in the posterior calf, etiology unknown.   *See table(s) above for measurements and observations.  Electronically signed by Deitra Mayo MD on 01/30/2019 at 9:32:51 AM.  Echo IMPRESSIONS    1. Left ventricular ejection fraction, by visual estimation, is 45 to 50%. The left ventricle has mildly decreased function. Normal left ventricular size. Left ventricular septal wall thickness was normal. Normal left ventricular posterior wall  thickness. There is no  left ventricular hypertrophy.  2. Indeterminate diastolic function due to mechanical mitral valve pattern of LV diastolic filling.  3. Right ventricular pressure overload and right ventricular volume overload.  4. Global right ventricle has moderately reduced systolic function.The right ventricular size is moderately enlarged. No increase in right ventricular wall thickness.  5. Left atrial size was moderately dilated.  6. Right atrial size was severely dilated.  7. The mitral valve has been repaired/replaced. No evidence of mitral valve regurgitation. No evidence of mitral stenosis.  8. The tricuspid valve is normal in structure. Tricuspid valve regurgitation  mild-moderate.  9. The aortic valve The aortic valve is normal in structure. Aortic valve regurgitation was not visualized by color flow Doppler. Structurally normal aortic valve, with no evidence of sclerosis or stenosis. 10. The pulmonic valve was normal in structure. Pulmonic valve regurgitation is mild by color flow Doppler. 11. Moderately elevated pulmonary artery systolic pressure. 12. The inferior vena cava is dilated in size with <50% respiratory variability, suggesting right atrial pressure of 15 mmHg.  Antimicrobials:  Anti-infectives (From admission, onward)   Start     Dose/Rate Route Frequency Ordered Stop   02/04/19 1730  ceFAZolin (ANCEF) IVPB 2g/100 mL premix     2 g 200 mL/hr over 30 Minutes Intravenous Every 12 hours 02/04/19 0948     02/01/19 2000  ceFAZolin (ANCEF) IVPB 2g/100 mL premix  Status:  Discontinued     2 g 200 mL/hr over 30 Minutes Intravenous Every 8 hours 02/01/19 1524 02/04/19 0948   02/01/19 1300  vancomycin (VANCOCIN) IVPB 750 mg/150 ml premix  Status:  Discontinued     750 mg 150 mL/hr over 60 Minutes Intravenous Every 24 hours 01/31/19 1152 02/01/19 1519   01/31/19 1300  vancomycin (VANCOCIN) 1,250 mg in sodium chloride 0.9 % 250 mL IVPB     1,250 mg 166.7 mL/hr over 90 Minutes Intravenous  Once 01/31/19 1152 01/31/19 1448   01/27/19 0930  cefTRIAXone (ROCEPHIN) 2 g in sodium chloride 0.9 % 100 mL IVPB  Status:  Discontinued     2 g 200 mL/hr over 30 Minutes Intravenous Every 24 hours 01/27/19 0824 02/01/19 1519      Subjective: Daughter at bedside, worried about swelling in leg - especially in foot - discuss this is dependent edema related to cellulitis Pt notes pain may be slightly better? Discussed plan to elevated, continue abx and continue to follow  Objective: Vitals:   02/04/19 0532 02/04/19 0548 02/04/19 0927 02/04/19 0928  BP: 90/79 97/68 104/68 104/68  Pulse: 93 92 89 89  Resp: 16 16  11   Temp: 98.5 F (36.9 C)     TempSrc: Oral      SpO2: 96% 98%  99%  Weight: 72.4 kg     Height:        Intake/Output Summary (Last 24 hours) at 02/04/2019 1333 Last data filed at 02/04/2019 0900 Gross per 24 hour  Intake 775 ml  Output 400 ml  Net 375 ml   Filed Weights   02/02/19 0558 02/03/19 0328 02/04/19 0532  Weight: 70.5 kg 71.2 kg 72.4 kg    Examination:  General: No acute distress. Cardiovascular: Heart sounds show a regular rate, and rhythm Lungs: Clear to auscultation bilaterally  Abdomen: Soft, nontender, nondistended . Neurological: Alert and oriented 3. Moves all extremities 4. Cranial nerves II through XII grossly intact. Extremities: LLE with swelling, blisters that have ruptured       Data Reviewed: I have personally reviewed following  labs and imaging studies  CBC: Recent Labs  Lab 01/31/19 0917 02/01/19 0248 02/02/19 0342 02/03/19 0323 02/04/19 0641  WBC 18.7* 23.1* 19.9* 16.2* 15.3*  HGB 11.4* 10.9* 10.9* 10.9* 9.8*  HCT 33.6* 32.7* 32.8* 32.4* 28.7*  MCV 85.7 84.7 85.4 84.8 84.9  PLT 124* 140* 138* 147* 630*   Basic Metabolic Panel: Recent Labs  Lab 01/30/19 0327 01/31/19 0917 02/01/19 0248 02/02/19 0342 02/03/19 0323 02/04/19 0641  NA 135 134* 138 136 133* 133*  K 3.8 4.0 3.6 3.9 4.3 4.4  CL 106 105 107 105 100 100  CO2 17* 17* 20* 20* 21* 21*  GLUCOSE 94 131* 120* 106* 114* 108*  BUN 60* 60* 59* 62* 65* 79*  CREATININE 1.72* 1.56* 1.48* 1.52* 1.66* 2.32*  CALCIUM 8.4* 8.6* 8.4* 8.5* 8.4* 8.4*  MG 2.2  --  2.4 2.3 2.3 2.3   GFR: Estimated Creatinine Clearance: 21.9 mL/min (A) (by C-G formula based on SCr of 2.32 mg/dL (H)). Liver Function Tests: Recent Labs  Lab 01/31/19 0917 02/01/19 0248 02/02/19 0342 02/03/19 0323 02/04/19 0641  AST 38 34 39 40 38  ALT 14 13 15 10 6   ALKPHOS 89 90 80 85 75  BILITOT 1.5* 1.8* 2.0* 1.4* 1.4*  PROT 8.1 7.3 7.9 8.2* 8.3*  ALBUMIN 2.4* 2.2* 2.2* 2.2* 2.1*   No results for input(s): LIPASE, AMYLASE in the last 168 hours. No  results for input(s): AMMONIA in the last 168 hours. Coagulation Profile: Recent Labs  Lab 01/31/19 1927 02/01/19 0248 02/02/19 0342 02/03/19 0323 02/04/19 0641  INR 2.1* 2.5* 3.0* 4.5* 5.1*   Cardiac Enzymes: No results for input(s): CKTOTAL, CKMB, CKMBINDEX, TROPONINI in the last 168 hours. BNP (last 3 results) No results for input(s): PROBNP in the last 8760 hours. HbA1C: No results for input(s): HGBA1C in the last 72 hours. CBG: No results for input(s): GLUCAP in the last 168 hours. Lipid Profile: No results for input(s): CHOL, HDL, LDLCALC, TRIG, CHOLHDL, LDLDIRECT in the last 72 hours. Thyroid Function Tests: No results for input(s): TSH, T4TOTAL, FREET4, T3FREE, THYROIDAB in the last 72 hours. Anemia Panel: No results for input(s): VITAMINB12, FOLATE, FERRITIN, TIBC, IRON, RETICCTPCT in the last 72 hours. Sepsis Labs: Recent Labs  Lab 01/31/19 1927 02/01/19 0248 02/02/19 0342  PROCALCITON 2.09 2.37 1.97    Recent Results (from the past 240 hour(s))  C difficile quick scan w PCR reflex     Status: None   Collection Time: 01/26/19  6:33 PM   Specimen: STOOL  Result Value Ref Range Status   C Diff antigen NEGATIVE NEGATIVE Final   C Diff toxin NEGATIVE NEGATIVE Final   C Diff interpretation No C. difficile detected.  Final    Comment: Performed at [facility], [place] [address], [place], [place] [address]  Culture, blood (routine x 2)     Status: None   Collection Time: 01/26/19  8:10 PM   Specimen: BLOOD  Result Value Ref Range Status   Specimen Description BLOOD BLOOD RIGHT FOREARM  Final   Special Requests   Final    BOTTLES DRAWN AEROBIC AND ANAEROBIC Blood Culture adequate volume   Culture   Final    NO GROWTH 5 DAYS Performed at [facility], [place] [address], [place], [place] [address]    Report Status 01/31/2019 FINAL  Final  Culture, blood (routine x 2)     Status: None   Collection Time: 01/26/19  8:25 PM   Specimen: BLOOD  LEFT HAND   Result Value Ref Range Status   Specimen Description BLOOD LEFT HAND  Final   Special Requests   Final    BOTTLES DRAWN AEROBIC ONLY Blood Culture adequate volume   Culture   Final    NO GROWTH 5 DAYS Performed at [facility], [address], [place], [place] [address]    Report Status 01/31/2019 FINAL  Final  Culture, Urine     Status: Abnormal   Collection Time: 01/28/19  6:40 PM   Specimen: Urine, Random  Result Value Ref Range Status   Specimen Description URINE, RANDOM  Final   Special Requests NONE  Final   Culture (A)  Final    <10,000 COLONIES/mL INSIGNIFICANT GROWTH Performed at [facility], [place] [address], [place], [place] [address]    Report Status 01/29/2019 FINAL  Final  Culture, blood (routine x 2)     Status: None (Preliminary result)   Collection Time: 02/01/19  8:50 AM   Specimen: BLOOD  Result Value Ref Range Status   Specimen Description BLOOD LEFT ANTECUBITAL  Final   Special Requests   Final    BOTTLES DRAWN AEROBIC AND ANAEROBIC Blood Culture adequate volume   Culture   Final    NO GROWTH 3 DAYS Performed at [facility], [place] [address] [place], [place] [address]    Report Status PENDING  Incomplete  Culture, blood (routine x 2)     Status: None (Preliminary result)   Collection Time: 02/01/19  8:55 AM   Specimen: BLOOD RIGHT HAND  Result Value Ref Range Status   Specimen Description BLOOD RIGHT HAND  Final   Special Requests   Final    BOTTLES DRAWN AEROBIC AND ANAEROBIC Blood Culture adequate volume   Culture   Final    NO GROWTH 3 DAYS Performed at [facility], [place] [address] [place], [place] [address]    Report Status PENDING  Incomplete         Radiology Studies: No results found.      Scheduled Meds: . acetaminophen  1,000 mg Oral Q8H  . aspirin EC  81 mg Oral Daily  . atorvastatin  40 mg Oral q1800  . feeding supplement (ENSURE ENLIVE)  237 mL Oral BID BM  . metoprolol succinate  12.5 mg Oral  Daily  . sodium bicarbonate  650 mg Oral TID  . ursodiol  300 mg Oral BID  . Warfarin - Pharmacist Dosing Inpatient   Does not apply q1800   Continuous Infusions: .  ceFAZolin (ANCEF) IV       LOS: 11 days    Time spent: over 30 min    Fayrene Helper, MD [facility] Pager AMION  If 7PM-7AM, please contact night-coverage [email or web address] Password [email or web address] 02/04/2019, 1:33 PM

## 2019-02-04 NOTE — Progress Notes (Signed)
Pt had incontinence of urine x1.  Large amount of urine noted. Post void bladder scan showed 204 ml.  Idolina Primer, RN

## 2019-02-04 NOTE — Progress Notes (Signed)
CRITICAL VALUE ALERT  Critical Value:  INR 5.1  Date & Time Notied:  02/04/2019 0903  Provider Notified: Dr. Florene Glen  Orders Received/Actions taken: Hold Warfarin

## 2019-02-04 NOTE — Progress Notes (Signed)
ANTICOAGULATION CONSULT NOTE  Pharmacy Consult for warfarin, heparin  Indication: atrial fibrillation and mechanical MVR  Vital Signs: Temp: 98.5 F (36.9 C) (09/27 0532) Temp Source: Oral (09/27 0532) BP: 104/68 (09/27 0928) Pulse Rate: 89 (09/27 0928)  Labs: Recent Labs    02/02/19 0342 02/03/19 0323 02/04/19 0641  HGB 10.9* 10.9* 9.8*  HCT 32.8* 32.4* 28.7*  PLT 138* 147* 127*  LABPROT 30.7* 42.0* 45.9*  INR 3.0* 4.5* 5.1*  CREATININE 1.52* 1.66* 2.32*    Estimated Creatinine Clearance: 21.9 mL/min (A) (by C-G formula based on SCr of 2.32 mg/dL (H)).  Assessment: 68 y.o. female admitted with CHF/SOB, h/o Afib and mechanical MVR, to continue warfarin. PTA regimen is 4 mg daily. INR has been elevated likely due to vitamin K deficiency.1mg  IV vitamin K given on 9/22.  Outpatient INR goal noted to be 2-3. And the goal was changed to 2.5-3.5 this admission  INR is continuing to trend up and today is still supratherapeutic at 5.1. Her H&H is slightly lower today at 9.8/28.7, plts wnl. Per nursing there is no sign of bleeding.    Of note, both cefazolin and cephalexin can increase the anticoagulant effect of warfarin.   Goal of Therapy:  INR 2.5-3.5 Monitor platelets by anticoagulation protocol: Yes   Plan:  -Will hold today's warfarin dose  -Daily INR and CBC - monitor for signs of bleeding     Thank you,   Eddie Candle, PharmD PGY-1 Pharmacy Resident   Please check amion for clinical pharmacist contact number

## 2019-02-05 ENCOUNTER — Inpatient Hospital Stay (HOSPITAL_COMMUNITY): Payer: Medicare Other

## 2019-02-05 LAB — CBC
HCT: 29.5 % — ABNORMAL LOW (ref 36.0–46.0)
HCT: 32.2 % — ABNORMAL LOW (ref 36.0–46.0)
Hemoglobin: 9.8 g/dL — ABNORMAL LOW (ref 12.0–15.0)
Hemoglobin: 10.9 g/dL — ABNORMAL LOW (ref 12.0–15.0)
MCH: 28.7 pg (ref 26.0–34.0)
MCH: 28.4 pg (ref 26.0–34.0)
MCHC: 33.2 g/dL (ref 30.0–36.0)
MCHC: 33.9 g/dL (ref 30.0–36.0)
MCV: 86.3 fL (ref 80.0–100.0)
MCV: 83.9 fL (ref 80.0–100.0)
Platelets: 139 10*3/uL — ABNORMAL LOW (ref 150–400)
Platelets: 129 10*3/uL — ABNORMAL LOW (ref 150–400)
RBC: 3.42 MIL/uL — ABNORMAL LOW (ref 3.87–5.11)
RBC: 3.84 MIL/uL — ABNORMAL LOW (ref 3.87–5.11)
RDW: 17.2 % — ABNORMAL HIGH (ref 11.5–15.5)
RDW: 17.1 % — ABNORMAL HIGH (ref 11.5–15.5)
WBC: 17.6 10*3/uL — ABNORMAL HIGH (ref 4.0–10.5)
WBC: 16.3 10*3/uL — ABNORMAL HIGH (ref 4.0–10.5)
nRBC: 0 % (ref 0.0–0.2)
nRBC: 0.1 % (ref 0.0–0.2)

## 2019-02-05 LAB — BASIC METABOLIC PANEL
Anion gap: 15 (ref 5–15)
BUN: 83 mg/dL — ABNORMAL HIGH (ref 8–23)
CO2: 22 mmol/L (ref 22–32)
Calcium: 8.4 mg/dL — ABNORMAL LOW (ref 8.9–10.3)
Chloride: 97 mmol/L — ABNORMAL LOW (ref 98–111)
Creatinine, Ser: 2.71 mg/dL — ABNORMAL HIGH (ref 0.44–1.00)
GFR calc Af Amer: 20 mL/min — ABNORMAL LOW (ref >60)
GFR calc non Af Amer: 17 mL/min — ABNORMAL LOW (ref >60)
Glucose, Bld: 93 mg/dL (ref 70–99)
Potassium: 5 mmol/L (ref 3.5–5.1)
Sodium: 134 mmol/L — ABNORMAL LOW (ref 135–145)

## 2019-02-05 LAB — DIFFERENTIAL
Abs Immature Granulocytes: 0 10*3/uL (ref 0.00–0.07)
Basophils Absolute: 0.2 10*3/uL — ABNORMAL HIGH (ref 0.0–0.1)
Basophils Relative: 1 %
Eosinophils Absolute: 0.2 10*3/uL (ref 0.0–0.5)
Eosinophils Relative: 1 %
Lymphocytes Relative: 1 %
Lymphs Abs: 0.2 10*3/uL — ABNORMAL LOW (ref 0.7–4.0)
Monocytes Absolute: 0.7 10*3/uL (ref 0.1–1.0)
Monocytes Relative: 4 %
Neutro Abs: 15.2 10*3/uL — ABNORMAL HIGH (ref 1.7–7.7)
Neutrophils Relative %: 93 %
nRBC: 0 /100 WBC

## 2019-02-05 LAB — SODIUM, URINE, RANDOM: Sodium, Ur: <10 mmol/L

## 2019-02-05 LAB — HEPATIC FUNCTION PANEL
ALT: <5 U/L (ref 0–44)
AST: 44 U/L — ABNORMAL HIGH (ref 15–41)
Albumin: 2 g/dL — ABNORMAL LOW (ref 3.5–5.0)
Alkaline Phosphatase: 76 U/L (ref 38–126)
Bilirubin, Direct: 0.9 mg/dL — ABNORMAL HIGH (ref 0.0–0.2)
Indirect Bilirubin: 0.8 mg/dL (ref 0.3–0.9)
Total Bilirubin: 1.7 mg/dL — ABNORMAL HIGH (ref 0.3–1.2)
Total Protein: 8.7 g/dL — ABNORMAL HIGH (ref 6.5–8.1)

## 2019-02-05 LAB — MAGNESIUM: Magnesium: 2.3 mg/dL (ref 1.7–2.4)

## 2019-02-05 LAB — PROTIME-INR
INR: 4.9 (ref 0.8–1.2)
Prothrombin Time: 44.7 seconds — ABNORMAL HIGH (ref 11.4–15.2)

## 2019-02-05 LAB — CREATININE, URINE, RANDOM: Creatinine, Urine: 165.74 mg/dL

## 2019-02-05 LAB — VANCOMYCIN, RANDOM: Vancomycin Rm: 10

## 2019-02-05 MED ORDER — CEFAZOLIN SODIUM-DEXTROSE 2-4 GM/100ML-% IV SOLN
2.0000 g | Freq: Two times a day (BID) | INTRAVENOUS | Status: DC
  Administered 2019-02-05 – 2019-02-09 (×8): 2 g via INTRAVENOUS
  Filled 2019-02-05 (×9): qty 100

## 2019-02-05 NOTE — Progress Notes (Signed)
PROGRESS NOTE    Marie Malone  HKV:[id number] DOB: 09-04-1950 DOA: 01/11/2019 PCP: Debbrah Alar, NP   Brief Narrative:  Marie Shatz Lipfordis a 68 y.o. 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 Patient states over the last 2 weeks patient has been getting progressively short of breath on exertion. Denies any chest pain fever chills or productive cough. Patient's primary care physician advised her to increase her Lasix twice daily which she is supposed to start today. Because of worsening shortness of breath patient came to the ER. EMS was called and patient initially was placed on BiPAP. Patient also had complained of some left calf cramping.  ED Course:In the ER patient was hypoxic placed on BiPAP chest x-ray shows features consistent with CHF. Lab work show a BNP of 1699, sodium 139 potassium 4.2 creatinine 1.1 WBC count of 7.6 hemoglobin 12.6 platelets 82 and EKG was showing A. fib rate at 106 bpm. Patient was given 60 mg IV Lasix following which patient was able to be weaned off BiPAP and presently is on 5 L oxygen. Patient admitted for further management of acute CHF. COVID-19 test was negative. On exam patient has good peripheral pulses with some edema in the lower extremity.  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   S/P CABG x 1   Acute on chronic systolic CHF (congestive heart failure) (HCC)   Atrial fibrillation, chronic   Cellulitis of left leg   Acute renal failure (HCC)   Primary biliary cirrhosis (HCC)   Chest pain at rest   H/O mitral valve replacement with  mechanical valve   Atypical atrial flutter (HCC)  LLE cellulitis - Blood culture, urine culture NGTD  - leukocytosis was improving, slight bump to 17.6 today - follow procalcitonin (downtrending) - vanc/ceftriaxone - narrowed to ancef per ID - ID c/s due to slow to improve -> improved on ancef - recommending keflex at discharge - Patient also with loose stools (possibly from Ursodiol) however patient has been chronically on this medication. - lactoferrin negative and c diff negative - pending GI path panel pending, fecal fat wnl - See CHF and PBS  - she has significant LLE pain when attempting to stand - will adjust pain regimen, continue abx, and follow - continued LE swelling, but improved warmth and pain - limited improvement  today- continue to elevate - discussed with nursing to elevated legs, loose dressing  LLE Swelling - suspect this is due to above - elevate LE above heart, continue to monitor - some concern for posterior knee bleed vs compartment syndrome -> but low suspicion for this based on imaging and exam with soft compartments - CT was not suggestive of hematoma, but did have marked skin thickening and subcutaneous edema - 9/21 L LE cellulitis slightly worse today, ? secondary to hypoperfusion with low BP -9/22 LLE fullness and pain increasing obtain CT LLE, given patient's supratherapeutic INR evaluate for bleeding and to left posterior knee joint and calf - LLE [place] from 9/22 read an notable for "significant fluid in posterior calf, unclear etiology" - discussed this with vascular to ensure this did not appear  to be fluid collection and he noted appeared c/w edema - pt with good distal pulses, will hold off on vascular c/s  Acute renal failure (baseline Cr 1.0)  NAGMA     -creatinine 2.71 today - this is after restarting diuresis  - continue IVF today.  She does have LE edema bilaterally - L>R.  She has low albumin which is likely contributing. - follow urinalysis ->  neg nitrite/LE - 11-20 WBC - follow urine lytes - renal US wnl - Renal c/s given edema  Acute respiratory failure with hypoxia -Not on home O2 -Currently on room air  Chest pain acute started 9/21  Elevated Troponin - Patient borderline hypotensive with MAP 60-63 - Obtain 12-lead EKG; atrial flutter, nonspecific ST-T wave changes no changes from previous EKG - Troponin high-sensitivity 791 -> 718 - cards following and aware - no plans for additional ischemic workup at this point  Acute on chronic systolic CHF -Strict in and out  -Daily weight                  - EF 40 to 45% see echocardiogram results below - 9/20 decreased metoprolol XR 12.5 mg daily (patient's BP on the hypotensive side) - Cardiology c/s - appreciate recs   Mechanical mitral valve - Continue warfarin, monitor INR     -Therapeutic INR goal 2.5-3.5 -Supratherapeutic today (4.9).  Warfarin on hold today.  Ancef can increase anticoagulant effect of warfarin.  Appreciate pharmacy assistance. - s/p reversal for rising INR on 9/22  Atrial fibrillation chronic - Currently NSR - Anticoagulated for her mechanical mitral valve - Consider DCCV prior to d/c (vs TEE/DCCV if pt INR becomes subtherapeutic)  - per cards, going to discuss with EP given suspicion for atach   Primary biliary cirrhosis(PBC) - 9/22 restart Ursodial 300 mg BID.   - benadryl for itching  Diarrhea - 9/22 patient reports diarrhea to cardiology -Fecal fat wnl -GI pathogen panel by PCR negative  -Lactoferrin negative -Stool osmolality pending -Stool K/NA - this doesn't appear to have been ordered  Anemia: Hb slightly downtrending, follow PM - no si/sx bleeding  Hypotension: mild, continue to monitor with IVF.  SBP 90's - 100's this morning.  Goals of care - On 9/21 place PT/OT request patient was able to ambulate independently at home prior to this hospitalization.  Due to the severity of the LLE cellulitis unsure patient will be  able to discharge home.  DVT prophylaxis: warfarin (INR supratherapeutic today) Code Status: full  Family Communication: none at bedside - daughter at bedside Disposition Plan: pending further improvement - needs PT eval prior to d/c home  Consultants:   cardiology  Procedures:  LE [place] Summary: Right: There is no evidence of deep vein thrombosis in the lower extremity. However, portions of this examination were limited- see technologist comments above. Left: There is no evidence of deep vein thrombosis in the lower extremity. However, portions of this examination were limited- see technologist comments above. Significant fluid noted in the posterior calf, etiology unknown.   *See table(s) above for measurements and observations.  Electronically signed by Deitra Mayo MD on 01/30/2019 at 9:32:51 AM.  Echo IMPRESSIONS    1. Left ventricular ejection fraction, by visual estimation, is 45 to 50%. The left ventricle has mildly decreased function. Normal left ventricular size. Left ventricular septal wall thickness was normal. Normal left ventricular posterior wall  thickness. There is no left ventricular hypertrophy.  2. Indeterminate diastolic function due to mechanical mitral valve  pattern of LV diastolic filling.  3. Right ventricular pressure overload and right ventricular volume overload.  4. Global right ventricle has moderately reduced systolic function.The right ventricular size is moderately enlarged. No increase in right ventricular wall thickness.  5. Left atrial size was moderately dilated.  6. Right atrial size was severely dilated.  7. The mitral valve has been repaired/replaced. No evidence of mitral valve regurgitation. No evidence of mitral stenosis.  8. The tricuspid valve is normal in structure. Tricuspid valve regurgitation mild-moderate.  9. The aortic valve The aortic valve is normal in structure. Aortic valve regurgitation was not visualized by color flow  Doppler. Structurally normal aortic valve, with no evidence of sclerosis or stenosis. 10. The pulmonic valve was normal in structure. Pulmonic valve regurgitation is mild by color flow Doppler. 11. Moderately elevated pulmonary artery systolic pressure. 12. The inferior vena cava is dilated in size with <50% respiratory variability, suggesting right atrial pressure of 15 mmHg.  Antimicrobials:  Anti-infectives (From admission, onward)   Start     Dose/Rate Route Frequency Ordered Stop   02/04/19 1730  ceFAZolin (ANCEF) IVPB 2g/100 mL premix     2 g 200 mL/hr over 30 Minutes Intravenous Every 12 hours 02/04/19 0948     02/01/19 2000  ceFAZolin (ANCEF) IVPB 2g/100 mL premix  Status:  Discontinued     2 g 200 mL/hr over 30 Minutes Intravenous Every 8 hours 02/01/19 1524 02/04/19 0948   02/01/19 1300  vancomycin (VANCOCIN) IVPB 750 mg/150 ml premix  Status:  Discontinued     750 mg 150 mL/hr over 60 Minutes Intravenous Every 24 hours 01/31/19 1152 02/01/19 1519   01/31/19 1300  vancomycin (VANCOCIN) 1,250 mg in sodium chloride 0.9 % 250 mL IVPB     1,250 mg 166.7 mL/hr over 90 Minutes Intravenous  Once 01/31/19 1152 01/31/19 1448   01/27/19 0930  cefTRIAXone (ROCEPHIN) 2 g in sodium chloride 0.9 % 100 mL IVPB  Status:  Discontinued     2 g 200 mL/hr over 30 Minutes Intravenous Every 24 hours 01/27/19 0824 02/01/19 1519      Subjective: Daughter at bedside Pt feels things may be a little better  Objective: Vitals:   02/04/19 2040 02/05/19 0355 02/05/19 0800 02/05/19 1007  BP: 109/67 (!) 104/58  96/61  Pulse: 88 84 87   Resp: 12 15 12    Temp: 97.9 F (36.6 C) 97.9 F (36.6 C)    TempSrc: Oral Oral    SpO2: 96% 96% 97%   Weight:  74.1 kg    Height:        Intake/Output Summary (Last 24 hours) at 02/05/2019 1253 Last data filed at 02/04/2019 2109 Gross per 24 hour  Intake 807.97 ml  Output -  Net 807.97 ml   Filed Weights   02/03/19 0328 02/04/19 0532 02/05/19 0355   Weight: 71.2 kg 72.4 kg 74.1 kg    Examination:  General: No acute distress. Cardiovascular: Heart sounds show a regular rate, and rhythm. Lungs: Clear to auscultation bilaterally  Abdomen: Soft, nontender, nondistended  Neurological: Alert and oriented 3. Moves all extremities 4. Cranial nerves II through XII grossly intact. Skin: Warm and dry. No rashes or lesions. Extremities: impressive LLE edema, ruptured blister to L calf.  Edema to thigh.  Her RLE also has edema, but less impressive.        Data Reviewed: I have personally reviewed following labs and imaging studies  CBC: Recent Labs  Lab 02/01/19 0248 02/02/19 0342  02/03/19 0323 02/04/19 0641 02/04/19 1631 02/05/19 0819  WBC 23.1* 19.9* 16.2* 15.3*  --  17.6*  HGB 10.9* 10.9* 10.9* 9.8* 9.8* 9.8*  HCT 32.7* 32.8* 32.4* 28.7* 28.9* 29.5*  MCV 84.7 85.4 84.8 84.9  --  86.3  PLT 140* 138* 147* 127*  --  867*   Basic Metabolic Panel: Recent Labs  Lab 02/01/19 0248 02/02/19 0342 02/03/19 0323 02/04/19 0641 02/05/19 0819  NA 138 136 133* 133* 134*  K 3.6 3.9 4.3 4.4 5.0  CL 107 105 100 100 97*  CO2 20* 20* 21* 21* 22  GLUCOSE 120* 106* 114* 108* 93  BUN 59* 62* 65* 79* 83*  CREATININE 1.48* 1.52* 1.66* 2.32* 2.71*  CALCIUM 8.4* 8.5* 8.4* 8.4* 8.4*  MG 2.4 2.3 2.3 2.3 2.3   GFR: Estimated Creatinine Clearance: 19 mL/min (A) (by C-G formula based on SCr of 2.71 mg/dL (H)). Liver Function Tests: Recent Labs  Lab 02/01/19 0248 02/02/19 0342 02/03/19 0323 02/04/19 0641 02/05/19 0819  AST 34 39 40 38 44*  ALT 13 15 10 6  <5  ALKPHOS 90 80 85 75 76  BILITOT 1.8* 2.0* 1.4* 1.4* 1.7*  PROT 7.3 7.9 8.2* 8.3* 8.7*  ALBUMIN 2.2* 2.2* 2.2* 2.1* 2.0*   No results for input(s): LIPASE, AMYLASE in the last 168 hours. No results for input(s): AMMONIA in the last 168 hours. Coagulation Profile: Recent Labs  Lab 02/01/19 0248 02/02/19 0342 02/03/19 0323 02/04/19 0641 02/05/19 0819  INR 2.5* 3.0* 4.5*  5.1* 4.9*   Cardiac Enzymes: No results for input(s): CKTOTAL, CKMB, CKMBINDEX, TROPONINI in the last 168 hours. BNP (last 3 results) No results for input(s): PROBNP in the last 8760 hours. HbA1C: No results for input(s): HGBA1C in the last 72 hours. CBG: No results for input(s): GLUCAP in the last 168 hours. Lipid Profile: No results for input(s): CHOL, HDL, LDLCALC, TRIG, CHOLHDL, LDLDIRECT in the last 72 hours. Thyroid Function Tests: No results for input(s): TSH, T4TOTAL, FREET4, T3FREE, THYROIDAB in the last 72 hours. Anemia Panel: No results for input(s): VITAMINB12, FOLATE, FERRITIN, TIBC, IRON, RETICCTPCT in the last 72 hours. Sepsis Labs: Recent Labs  Lab 01/31/19 1927 02/01/19 0248 02/02/19 0342  PROCALCITON 2.09 2.37 1.97    Recent Results (from the past 240 hour(s))  C difficile quick scan w PCR reflex     Status: None   Collection Time: 01/26/19  6:33 PM   Specimen: STOOL  Result Value Ref Range Status   C Diff antigen NEGATIVE NEGATIVE Final   C Diff toxin NEGATIVE NEGATIVE Final   C Diff interpretation No C. difficile detected.  Final    Comment: Performed at [facility], [place] [address] [place], [place] [address]  Culture, blood (routine x 2)     Status: None   Collection Time: 01/26/19  8:10 PM   Specimen: BLOOD  Result Value Ref Range Status   Specimen Description BLOOD BLOOD RIGHT FOREARM  Final   Special Requests   Final    BOTTLES DRAWN AEROBIC AND ANAEROBIC Blood Culture adequate volume   Culture   Final    NO GROWTH 5 DAYS Performed at [facility], [place] [address], [place], [place] [address]    Report Status 01/31/2019 FINAL  Final  Culture, blood (routine x 2)     Status: None   Collection Time: 01/26/19  8:25 PM   Specimen: BLOOD LEFT HAND  Result Value Ref Range Status   Specimen Description BLOOD LEFT  HAND  Final   Special Requests   Final    BOTTLES DRAWN AEROBIC ONLY Blood Culture adequate volume   Culture   Final    NO  GROWTH 5 DAYS Performed at [facility], [address], [place], [place] [address]    Report Status 01/31/2019 FINAL  Final  Culture, Urine     Status: Abnormal   Collection Time: 01/28/19  6:40 PM   Specimen: Urine, Random  Result Value Ref Range Status   Specimen Description URINE, RANDOM  Final   Special Requests NONE  Final   Culture (A)  Final    <10,000 COLONIES/mL INSIGNIFICANT GROWTH Performed at [facility], [place] [address] [place], [place] [address]    Report Status 01/29/2019 FINAL  Final  Culture, blood (routine x 2)     Status: None (Preliminary result)   Collection Time: 02/01/19  8:50 AM   Specimen: BLOOD  Result Value Ref Range Status   Specimen Description BLOOD LEFT ANTECUBITAL  Final   Special Requests   Final    BOTTLES DRAWN AEROBIC AND ANAEROBIC Blood Culture adequate volume   Culture   Final    NO GROWTH 4 DAYS Performed at [facility], [place] [address], [place], [place] [address]    Report Status PENDING  Incomplete  Culture, blood (routine x 2)     Status: None (Preliminary result)   Collection Time: 02/01/19  8:55 AM   Specimen: BLOOD RIGHT HAND  Result Value Ref Range Status   Specimen Description BLOOD RIGHT HAND  Final   Special Requests   Final    BOTTLES DRAWN AEROBIC AND ANAEROBIC Blood Culture adequate volume   Culture   Final    NO GROWTH 4 DAYS Performed at [facility], [place] [address] [place], [place] [address]    Report Status PENDING  Incomplete         Radiology Studies: US Renal  Result Date: 02/05/2019 CLINICAL DATA:  Acute kidney injury EXAM: RENAL / URINARY TRACT ULTRASOUND COMPLETE COMPARISON:  CT 08/09/2017 [facility] FINDINGS: Right Kidney: Renal measurements: 10.8 x 4.5 x 5.5 cm = volume: 141 mL . Echogenicity within normal limits. No mass or hydronephrosis visualized. Right renal scarring. Left Kidney: Renal measurements: 10.9 x 5.9 x 5.6 cm = volume: 190 mL. Echogenicity within  normal limits. No mass or hydronephrosis visualized. Bladder: No focal abnormality.  Moderate amount of pelvic free fluid. IMPRESSION: 1. Normal renal ultrasound. 2. Moderate amount of pelvic free fluid. Electronically Signed   By: Kathreen Devoid   On: 02/05/2019 12:29        Scheduled Meds: . acetaminophen  1,000 mg Oral Q8H  . aspirin EC  81 mg Oral Daily  . atorvastatin  40 mg Oral q1800  . feeding supplement (ENSURE ENLIVE)  237 mL Oral BID BM  . metoprolol succinate  12.5 mg Oral Daily  . ursodiol  300 mg Oral BID  . Warfarin - Pharmacist Dosing Inpatient   Does not apply q1800   Continuous Infusions: .  ceFAZolin (ANCEF) IV 2 g (02/05/19 0551)  . lactated ringers 100 mL/hr at 02/05/19 1233     LOS: 12 days    Time spent: over 30 min    Fayrene Helper, MD [facility] Pager AMION  If 7PM-7AM, please contact night-coverage [email or web address] Password [facility] 02/05/2019, 12:53 PM

## 2019-02-05 NOTE — Progress Notes (Signed)
ANTICOAGULATION CONSULT NOTE  Pharmacy Consult for warfarin Indication: atrial fibrillation and mechanical MVR  Vital Signs: Temp: 97.9 F (36.6 C) (09/28 0355) Temp Source: Oral (09/28 0355) BP: 104/58 (09/28 0355) Pulse Rate: 84 (09/28 0355)  Labs: Recent Labs    02/03/19 0323 02/04/19 0641 02/04/19 1631  HGB 10.9* 9.8* 9.8*  HCT 32.4* 28.7* 28.9*  PLT 147* 127*  --   LABPROT 42.0* 45.9*  --   INR 4.5* 5.1*  --   CREATININE 1.66* 2.32*  --     Estimated Creatinine Clearance: 22.2 mL/min (A) (by C-G formula based on SCr of 2.32 mg/dL (H)).  Assessment: 68 y.o. female admitted with CHF/SOB, h/o Afib and mechanical MVR, to continue warfarin. PTA regimen is 4 mg daily. INR has been elevated likely due to vitamin K deficiency.1mg  IV vitamin K given on 9/22.  Outpatient INR goal noted to be 2-3. And the goal was changed to 2.5-3.5 this admission.   INR is still elevated this morning at 4.9. Her H&H is stable overnight at 9.8/29, plts also low but stable. No bleeding issues noted.   Goal of Therapy:  INR 2.5-3.5 Monitor platelets by anticoagulation protocol: Yes   Plan:  -Will continue to hold warfarin   -Daily INR and CBC   Thank you,   Erin Hearing PharmD., BCPS Clinical Pharmacist 02/05/2019 7:28 AM

## 2019-02-05 NOTE — Consult Note (Signed)
Reason for Consult: Acute Kidney Injury Referring Physician: Fayrene Helper M.D. [facility])  HPI:  68 year old African-American woman with past medical history significant for atrial fibrillation, coronary artery disease status post single-vessel CABG, history of mechanical mitral valve replacement, pulmonary hypertension, chronic diastolic heart failure, primary biliary cirrhosis and what appears to be essentially normal renal function at baseline (creatinine 1.0-1.2).  She was admitted to the hospital 12 days ago with acute hypoxic respiratory failure secondary to CHF exacerbation and left leg cellulitis.  This was treated with diuresis and Ancef respectively.  She denies any prior history of acute kidney injury, recurrent nephrolithiasis or recurrent urinary tract infection.  With initiation of diuresis, the patient's creatinine is noted to have risen to a stable level of around 1.5-1.7 but further concern was raised yesterday with creatinine rising acutely to 2.3 and today to 2.7 in spite of discontinuing furosemide.  Her ARB was discontinued earlier in hospitalization and renal ultrasound was negative for obstruction.  An episode of urinary incontinence was noted overnight with bladder scan showing 200 cc of urine.  She is noted to have had some relative hypotension without any iodinated intravenous contrast exposure.  Her last vancomycin dose was on 9/24.  She has not had any iodinated intravenous contrast exposure.  Urinalysis from yesterday shows 0-5 RBC/hpf and 11-20 WBC/hpf.  Urine sodium on 01/28/2019 was <10.  She had some diarrhea earlier on in the hospitalization however this has resolved.   01/30/2019  02/01/2019  02/03/2019  02/04/2019  02/05/2019   BUN 60 (H) 59 (H) 65 (H) 79 (H) 83 (H)  Creatinine 1.72 (H) 1.48 (H) 1.66 (H) 2.32 (H) 2.71 (H)    Past Medical History:  Diagnosis Date  . Atrial fibrillation ([place])   . Blood transfusion without reported diagnosis 05/2016  . Chronic diastolic CHF  (congestive heart failure) ([place])   . Clotting disorder ([place]) 2014   placed on Warfarin  . Complication of anesthesia    difficult to wake up from anesthesia  . Coronary artery disease involving native coronary artery without angina pectoris 03/26/2016   100% chronic occlusion of RCA  . Dyspnea   . Heart murmur   . History of rheumatic fever    as a child  . Hyperlipidemia   . Hypertension   . Mitral stenosis   . OSA (obstructive sleep apnea)    mild per sleep study 2008  . Paroxysmal atrial fibrillation (H[facility])   . Pneumonia    double pneumonia once  . Pulmonary hypertension ([place])   . Rheumatic mitral regurgitation   . S/P balloon mitral valvuloplasty 2008   . 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 S/P mitral valve replacement with mechanical valve 05/28/2016   29 mm Sorin Carbomedics Optiform bileaflet mechanical prosthetic valve  . Stroke [facility])    x2    Past Surgical History:  Procedure Laterality Date  . APPENDECTOMY  1989   appendix ruptured,had peritonitis  . BALLOON VALVULOPLASTY  2008   [facility]  . CARDIAC CATHETERIZATION N/A 03/25/2016   Procedure: Right/Left Heart Cath and Coronary Angiography;  Surgeon: Jettie Booze, MD;  Location: [place] CV LAB;  Service: Cardiovascular;  Laterality: N/A;  . COLONOSCOPY W/ POLYPECTOMY    . CORONARY ARTERY BYPASS GRAFT N/A 05/28/2016   Procedure: CORONARY ARTERY BYPASS GRAFTING (CABG) x 1 WITH ENDOSCOPIC HARVESTING OF RIGHT SAPHENOUS VEIN, EVH- PDA;  Surgeon: Rexene Alberts, MD;  Location: [place];  Service: Open Heart Surgery;  Laterality: N/A;  . ENDARTERECTOMY Right 03/10/2016   Procedure: ENDARTERECTOMY CAROTID RIGHT;  Surgeon: Serafina Mitchell, MD;  Location: [place];  Service: Vascular;  Laterality: Right;  . EP IMPLANTABLE DEVICE N/A 03/11/2016   Procedure: Loop Recorder Removal;  Surgeon: Deboraha Sprang, MD;  Location: [place] CV LAB;  Service: Cardiovascular;  Laterality: N/A;  . LOOP RECORDER  IMPLANT  04-10-2013   MDT LinQ implanted by Dr Rayann Heman for cryptogenic stroke  . LOOP RECORDER IMPLANT N/A 04/10/2013   Procedure: LOOP RECORDER IMPLANT;  Surgeon: Coralyn Mark, MD;  Location: [place] CATH LAB;  Service: Cardiovascular;  Laterality: N/A;  . MAZE N/A 05/28/2016   Procedure: MAZE PROCEDURE AND APPLICATION OF  ATRICLIP LAA PROCLIP II 17 MM;  Surgeon: Rexene Alberts, MD;  Location: [place];  Service: Open Heart Surgery;  Laterality: N/A;  . MITRAL VALVE REPLACEMENT N/A 05/28/2016   Procedure: MITRAL VALVE (MV) REPLACEMENT USING 29 MM CARBOMEDICS OPTIFORM MECHANICAL BILEAFLET PROSTHETIC HEART VALVE;  Surgeon: Rexene Alberts, MD;  Location: [place];  Service: Open Heart Surgery;  Laterality: N/A;  . MULTIPLE EXTRACTIONS WITH ALVEOLOPLASTY N/A 03/30/2016   Procedure: EXTRACTION OF TOOTH #'S 2,5,6,11,15,16,20-30 AND 32 WITH ALVEOLOPLASTY AND BILATERAL MAXILLARY LATERAL EXOSTOSES REDUCTIONS;  Surgeon: Lenn Cal, DDS;  Location: [place];  Service: Oral Surgery;  Laterality: N/A;  . PATCH ANGIOPLASTY Right 03/10/2016   Procedure: PATCH ANGIOPLASTY CAROTID RIGHT USING Rueben Bash BIOLOGIC PATCH;  Surgeon: Serafina Mitchell, MD;  Location: [place];  Service: Vascular;  Laterality: Right;  . TEE WITHOUT CARDIOVERSION N/A 04/10/2013   Procedure: TRANSESOPHAGEAL ECHOCARDIOGRAM (TEE);  Surgeon: [name], MD;  Location: [facility] ENDOSCOPY;  Service: Cardiovascular;  Laterality: N/A;  . TEE WITHOUT CARDIOVERSION N/A 03/24/2016   Procedure: TRANSESOPHAGEAL ECHOCARDIOGRAM (TEE);  Surgeon: Jerline Pain, MD;  Location: [place];  Service: Cardiovascular;  Laterality: N/A;  . TEE WITHOUT CARDIOVERSION N/A 05/28/2016   Procedure: TRANSESOPHAGEAL ECHOCARDIOGRAM (TEE);  Surgeon: Rexene Alberts, MD;  Location: [place];  Service: Open Heart Surgery;  Laterality: N/A;  . TUBAL LIGATION      Family History  Problem Relation Age of Onset  . Diabetes Mother   . Stroke Mother   . Diabetes Father   . Heart disease  Father        CHF  . Cancer Father        kidney  . Diabetes Sister   . Diabetes Brother   . Hypertension Daughter   . Colon polyps Neg Hx   . Colon cancer Neg Hx   . Esophageal cancer Neg Hx   . Rectal cancer Neg Hx   . Stomach cancer Neg Hx     Social History:  reports that she quit smoking about 43 years ago. Her smoking use included cigarettes. She started smoking about 48 years ago. She has a 5.00 pack-year smoking history. She has never used smokeless tobacco. She reports that she does not drink alcohol or use drugs.  Allergies:  Allergies  Allergen Reactions  . No Known Allergies     Medications:  Scheduled: . acetaminophen  1,000 mg Oral Q8H  . aspirin EC  81 mg Oral Daily  . atorvastatin  40 mg Oral q1800  . feeding supplement (ENSURE ENLIVE)  237 mL Oral BID BM  . metoprolol succinate  12.5 mg Oral Daily  . ursodiol  300 mg Oral BID  . Warfarin - Pharmacist Dosing Inpatient   Does  not apply q1800    BMP Latest Ref Rng & Units 02/05/2019 02/04/2019 02/03/2019  Glucose 70 - 99 mg/dL 93 108(H) 114(H)  BUN 8 - 23 mg/dL 83(H) 79(H) 65(H)  Creatinine 0.44 - 1.00 mg/dL 2.71(H) 2.32(H) 1.66(H)  BUN/Creat Ratio 12 - 28 - - -  Sodium 135 - 145 mmol/L 134(L) 133(L) 133(L)  Potassium 3.5 - 5.1 mmol/L 5.0 4.4 4.3  Chloride 98 - 111 mmol/L 97(L) 100 100  CO2 22 - 32 mmol/L 22 21(L) 21(L)  Calcium 8.9 - 10.3 mg/dL 8.4(L) 8.4(L) 8.4(L)   CBC Latest Ref Rng & Units 02/05/2019 02/04/2019 02/04/2019  WBC 4.0 - 10.5 K/uL 17.6(H) - 15.3(H)  Hemoglobin 12.0 - 15.0 g/dL 9.8(L) 9.8(L) 9.8(L)  Hematocrit 36.0 - 46.0 % 29.5(L) 28.9(L) 28.7(L)  Platelets 150 - 400 K/uL 139(L) - 127(L)   Urinalysis    Component Value Date/Time   COLORURINE AMBER (A) 02/04/2019 1922   APPEARANCEUR CLOUDY (A) 02/04/2019 1922   LABSPEC 1.033 (H) 02/04/2019 1922   PHURINE 5.0 02/04/2019 1922   GLUCOSEU NEGATIVE 02/04/2019 1922   GLUCOSEU NEGATIVE 05/15/2018 [place] 02/04/2019 1922    BILIRUBINUR NEGATIVE 02/04/2019 1922   BILIRUBINUR NEG 11/07/2017 1013   KETONESUR 5 (A) 02/04/2019 1922   PROTEINUR 30 (A) 02/04/2019 1922   UROBILINOGEN 1.0 05/15/2018 1525   NITRITE NEGATIVE 02/04/2019 1922   LEUKOCYTESUR NEGATIVE 02/04/2019 1922    US Renal  Result Date: 02/05/2019 CLINICAL DATA:  Acute kidney injury EXAM: RENAL / URINARY TRACT ULTRASOUND COMPLETE COMPARISON:  CT 08/09/2017 [facility] FINDINGS: Right Kidney: Renal measurements: 10.8 x 4.5 x 5.5 cm = volume: 141 mL . Echogenicity within normal limits. No mass or hydronephrosis visualized. Right renal scarring. Left Kidney: Renal measurements: 10.9 x 5.9 x 5.6 cm = volume: 190 mL. Echogenicity within normal limits. No mass or hydronephrosis visualized. Bladder: No focal abnormality.  Moderate amount of pelvic free fluid. IMPRESSION: 1. Normal renal ultrasound. 2. Moderate amount of pelvic free fluid. Electronically Signed   By: Kathreen Devoid   On: 02/05/2019 12:29    Review of Systems  Constitutional: Positive for malaise/fatigue. Negative for chills and fever.  HENT: Negative.  Negative for hearing loss, nosebleeds, sore throat and tinnitus.   Eyes: Negative.   Respiratory: Positive for shortness of breath. Negative for cough and hemoptysis.   Cardiovascular: Negative.   Gastrointestinal: Positive for diarrhea. Negative for nausea and vomiting.  Genitourinary: Negative.   Musculoskeletal: Negative.   Skin: Positive for itching.       Chronic itching  Neurological: Negative.    Blood pressure 96/61, pulse 87, temperature 97.9 F (36.6 C), temperature source Oral, resp. rate 12, height 5\' 2"  (1.575 m), weight 74.1 kg, SpO2 97 %. Physical Exam  Nursing note and vitals reviewed. Constitutional: She is oriented to person, place, and time. She appears well-developed and well-nourished. No distress.  HENT:  Head: Normocephalic and atraumatic.  Mouth/Throat: Oropharynx is clear and moist. No  oropharyngeal exudate.  Eyes: Pupils are equal, round, and reactive to light. EOM are normal. No scleral icterus.  Neck: Normal range of motion. Neck supple. No JVD present.  Cardiovascular:  Murmur heard. Irregularly irregular with mechanical S2 click  Respiratory:  Poor inspiratory effort with decreased breath sounds over bases  GI: Soft. Bowel sounds are normal. There is no abdominal tenderness. There is no rebound and no guarding.  Musculoskeletal:        General: Edema present.  Comments: 2+ left lower extremity edema, trace right lower extremity edema  Neurological: She is alert and oriented to person, place, and time.  Skin: Skin is warm and dry.  Psychiatric: She has a normal mood and affect.    Assessment/Plan: 1.  Acute kidney injury: Appears oliguric.  Differential diagnosis include ATN and acute interstitial nephritis (AIN).  I will await urine electrolytes that were sent off today and add a differential count to previously drawn CBC to look for eosinophilia that may be a clue to the latter.  If renal function continues to worsen and labs are unyielding, we may need renal biopsy for definitive diagnosis (although this will be clearly challenging with her supratherapeutic INR).  She does not have any acute electrolyte abnormalities to prompt intervention and has hypervolemia preferentially of the left lower extremity for which diuresis may be required soon.  Discontinued intravenous fluids.  Renal ultrasound negative for any obstruction. 2.  Hypoxic respiratory failure: Secondary to acute exacerbation of diastolic heart failure in this patient with pulmonary hypertension.  Difficult situation as diuresis will more than likely worsen renal function before satisfactorily volume unloading her. 3.  Left leg cellulitis: This has been rather refractory/resistant to management so far and I agree with Dr. Abel Presto plan to pursue imaging of her abdomen to evaluate for possible  space-occupying lesion/mass limiting venous drainage of the left lower extremity and resulting in venous hypertension/congestion. 4.  Anemia: Possibly secondary to chronic disease.  No overt loss noted.  Continue to monitor with ongoing warfarin therapy. 5.  History of mechanical mitral valve 6.  History of primary biliary cirrhosis  Clifton Kovacic K. 02/05/2019, 12:53 PM

## 2019-02-05 NOTE — Progress Notes (Signed)
Dr. Marcelline Deist is notified about pt's low urine output. Orders received to increase IV fluids to 100 ml/hr. Continue to monitor the patient

## 2019-02-05 NOTE — Progress Notes (Addendum)
Progress Note  Patient Name: Marie Malone Date of Encounter: 02/05/2019  Primary Cardiologist: [name], MD   Subjective   No longer on O2. Denies chest pain and sob. Still has lower left leg pain with some swelling.   Inpatient Medications    Scheduled Meds: . acetaminophen  1,000 mg Oral Q8H  . aspirin EC  81 mg Oral Daily  . atorvastatin  40 mg Oral q1800  . feeding supplement (ENSURE ENLIVE)  237 mL Oral BID BM  . metoprolol succinate  12.5 mg Oral Daily  . ursodiol  300 mg Oral BID  . Warfarin - Pharmacist Dosing Inpatient   Does not apply q1800   Continuous Infusions: .  ceFAZolin (ANCEF) IV 2 g (02/05/19 0551)  . lactated ringers 50 mL/hr at 02/05/19 0117   PRN Meds: diphenhydrAMINE, docusate sodium, morphine injection, ondansetron **OR** ondansetron (ZOFRAN) IV, oxyCODONE **OR** oxyCODONE   Vital Signs    Vitals:   02/04/19 0927 02/04/19 0928 02/04/19 2040 02/05/19 0355  BP: 104/68 104/68 109/67 (!) 104/58  Pulse: 89 89 88 84  Resp:  11 12 15   Temp:   97.9 F (36.6 C) 97.9 F (36.6 C)  TempSrc:   Oral Oral  SpO2:  99% 96% 96%  Weight:    74.1 kg  Height:        Intake/Output Summary (Last 24 hours) at 02/05/2019 0759 Last data filed at 02/04/2019 2109 Gross per 24 hour  Intake 1447.97 ml  Output -  Net 1447.97 ml   Last 3 Weights 02/05/2019 02/04/2019 02/03/2019  Weight (lbs) 163 lb 5.8 oz 159 lb 9.8 oz 156 lb 15.5 oz  Weight (kg) 74.1 kg 72.4 kg 71.2 kg      Telemetry    2:1 Atrial tachcardia; rates in the 80s; first degree AV block (PRI 0.27 s), occasional PVC - Personally Reviewed  ECG    2:1 atrial tachycardia, 93 bpm - Personally Reviewed  Physical Exam   GEN: No acute distress.   Neck: No JVD Cardiac: RRR,+ mechanical valve click, rubs, or gallops.  Respiratory: Clear to auscultation bilaterally. GI: Soft, nontender, non-distended  MS: LLE 1+ edema; No deformity. Neuro:  Nonfocal  Psych: Normal affect   Labs    High  Sensitivity Troponin:   Recent Labs  Lab 01/24/19 0259 01/24/19 0738 01/24/19 0807 01/29/19 1635 01/29/19 1835  TROPONINIHS 132* 111* 113* 791* 718*      Chemistry Recent Labs  Lab 02/02/19 0342 02/03/19 0323 02/04/19 0641  NA 136 133* 133*  K 3.9 4.3 4.4  CL 105 100 100  CO2 20* 21* 21*  GLUCOSE 106* 114* 108*  BUN 62* 65* 79*  CREATININE 1.52* 1.66* 2.32*  CALCIUM 8.5* 8.4* 8.4*  PROT 7.9 8.2* 8.3*  ALBUMIN 2.2* 2.2* 2.1*  AST 39 40 38  ALT 15 10 6   ALKPHOS 80 85 75  BILITOT 2.0* 1.4* 1.4*  GFRNONAA 35* 32* 21*  GFRAA 41* 37* 24*  ANIONGAP 11 12 12      Hematology Recent Labs  Lab 02/02/19 0342 02/03/19 0323 02/04/19 0641 02/04/19 1631  WBC 19.9* 16.2* 15.3*  --   RBC 3.84* 3.82* 3.38*  --   HGB 10.9* 10.9* 9.8* 9.8*  HCT 32.8* 32.4* 28.7* 28.9*  MCV 85.4 84.8 84.9  --   MCH 28.4 28.5 29.0  --   MCHC 33.2 33.6 34.1  --   RDW 16.3* 16.4* 16.6*  --   PLT 138* 147* 127*  --  BNPNo results for input(s): BNP, PROBNP in the last 168 hours.   DDimer No results for input(s): DDIMER in the last 168 hours.   Radiology    No results found.  Cardiac Studies   Echo 01/24/19  1. Left ventricular ejection fraction, by visual estimation, is 45 to 50%. The left ventricle has mildly decreased function. Normal left ventricular size. Left ventricular septal wall thickness was normal. Normal left ventricular posterior wall  thickness. There is no left ventricular hypertrophy.  2. Indeterminate diastolic function due to mechanical mitral valve pattern of LV diastolic filling.  3. Right ventricular pressure overload and right ventricular volume overload.  4. Global right ventricle has moderately reduced systolic function.The right ventricular size is moderately enlarged. No increase in right ventricular wall thickness.  5. Left atrial size was moderately dilated.  6. Right atrial size was severely dilated.  7. The mitral valve has been repaired/replaced. No  evidence of mitral valve regurgitation. No evidence of mitral stenosis.  8. The tricuspid valve is normal in structure. Tricuspid valve regurgitation mild-moderate.  9. The aortic valve The aortic valve is normal in structure. Aortic valve regurgitation was not visualized by color flow Doppler. Structurally normal aortic valve, with no evidence of sclerosis or stenosis. 10. The pulmonic valve was normal in structure. Pulmonic valve regurgitation is mild by color flow Doppler. 11. Moderately elevated pulmonary artery systolic pressure. 12. The inferior vena cava is dilated in size with <50% respiratory variability, suggesting right atrial pressure of 15 mmHg.  Right and left cardiac catheterization 03/25/2016:   left ventricular systolic function is normal.  The left ventricular ejection fraction is 55-65% by visual estimate.  There is no aortic valve stenosis.  There is moderate mitral valve stenosis. Mean gradient 10 mm Hg. MV area 1.3 cm2  Mid Cx to Dist Cx lesion, 10 %stenosed.  Ost 1st Mrg to 1st Mrg lesion, 20 %stenosed.  Prox RCA lesion, 100 %stenosed. Left to right and right to right collaterals are present.  Hemodynamic findings consistent with moderate pulmonary hypertension and mitral valve stenosis.  LV end diastolic pressure is normal.  Patient Profile     68 y.o. female with a history of rheumatic fever/mitral valve stenosis s/p valvuloplasty at [facility] in 2008, CAD s/p CABG, mechanical MVR, and MAZE procedure in 05/2016, paroxysmal atrial fibrillation on Coumadin, PAD s/p endarterectomy in 03/2016 who was admitted on 01/24/2019 for acute on chronic CHF. Found to be in atrial fibrillation on presentation felt to have converted to NSR, but after further evaluation seems patient has been in aflutter.   Assessment & Plan   Acute on Chronic combined systolic and diastolic heart failure - patient presented 9/15 with SOB and BNP elevated to 1600. CXR with pulmonary edema and  small right pleural effusion - Decompensation possibly from diet noncompliance and afib - Echo showed EF 45-50% with right ventricular dysfunction.  - Was on IV lasix but was stopped due to diarrhea on 9/18>> resumed on 9/25 for worsening LE edema and then stopped again 9/27 for worsening creatinine and IVF bolus was given - Repeat CXR 9/24 with no pleural effusion noted - Creatinine 2.32  > 2.71. Will likely need nephrology consult/renal US - BB was decreased for hypotension - weights appear to be going up - I/Os not reflective of diuresis - Patient was on O2 over the weekend, now off and breathing improved - Left leg has swelling likely secondary to cellulitis. - Continue to hold diuretics  2:1 Atrial tachycardia ?/Chronic  afib with h/o of maze procedure in 2018 Was in afib on admission and then thought to have converted to NSR vs 2:1 aflutter vs 2:1 atach. Plan was for possible cardioversion if aflutter.    - INR is 4.9 today >> coumadin on hold today per pharmacy. INR upward trend likely due to abx - Continue metoprolol for rate control - EKG showing 2:1 atach ? - electrolytes wnl, TSH wnl - CHADSVASC = 4 (Age, female, CHF, vascular dz) - Patient will not need cardioversion if patient is in atrial tachycardia - MD to see  LLE cellulitis with swelling - Leukocytosis Improving but still has some leg pain - Swelling thought to be 2/2 to cellulitis. Ct negative for hematoma. Per Vascular more consistent with edema - abx per ID - Once infection is better plan for DCCV   Mechanical MVR 2/2 Rheumatic mitral valve disease - INR 4.9 today, goal 2.5-3.5 - Coumadin per pharmacy  AKI - Creatinine 1.66 > 2.32 > 2.71 - Baseline at 1.0 - Arb held last week - Renal US - Consider nephrology consult given worsening creatinine despite holding lasix and IVF  Chest pain with h/o of CAD s/p CABG - Hs troponin 791 > 718 and no EKG changes - No complaints of chest pain - no further ischemic  work-up at this time - Continue ASA, BB, statin  For questions or updates, please contact [place] Please consult [email or web address] for contact info under        Signed, Cadence Ninfa Meeker, PA-C  02/05/2019, 7:59 AM     Patient seen and examined.  Agree with above documentation.  She is alert and oriented, RRR, mechanical click, no JVD, 1+ BLE, lungs CTAB.  EKG and telemetry personally reviewed, suspect atch with 2:1 block but could be 2:1 aflutter with long cycle length as she is s/p MAZE.  Will discuss with EP.  Worsening renal function, recommend Nephrology consult.

## 2019-02-05 NOTE — TOC Initial Note (Signed)
Transition of Care [facility]) - Initial/Assessment Note    Patient Details  Name: Marie Malone MRN: [id number] Date of Birth: [birth year]  Transition of Care [facility]) CM/SW Contact:    Eileen Stanford, LCSW Phone Number: 02/05/2019, 3:44 PM  Clinical Narrative:    Pt is alert and oriented. Pt states she does not want to go to SNF and has a lot of people at home who can care for her. Pt states she lives with her spouse, daughter, son and daughter in Sports coach. Pt states she has had [place] before but could not remember name of agency.  Pt gave CSW permission to search for [facility] agency will take her with [facility]. CSW has heard back from the following  [facility]- can not take [facility]- can not take [facility]- can not take [facility]- reviewing               Expected Discharge Plan: [place] Barriers to Discharge: Continued Medical Work up   Patient Goals and CMS Choice Patient states their goals for this hospitalization and ongoing recovery are:: "to go home" CMS Medicare.gov Compare Post Acute Care list provided to:: Patient Choice offered to / list presented to : Patient  Expected Discharge Plan and Services Expected Discharge Plan: [place] In-house Referral: NA Discharge Planning Services: CM Consult Post Acute Care Choice: [place] arrangements for the past 2 months: Single Family Home                                      Prior Living Arrangements/Services Living arrangements for the past 2 months: Single Family Home Lives with:: Adult Children, Spouse Patient language and need for interpreter reviewed:: Yes Do you feel safe going back to the place where you live?: Yes      Need for Family Participation in Patient Care: Yes (Comment) Care giver support system in place?: Yes (comment)   Criminal Activity/Legal Involvement Pertinent to Current Situation/Hospitalization: No - Comment as needed  Activities of Daily Living Home Assistive  Devices/Equipment: None ADL Screening (condition at time of admission) Patient's cognitive ability adequate to safely complete daily activities?: Yes Is the patient deaf or have difficulty hearing?: No Does the patient have difficulty seeing, even when wearing glasses/contacts?: No Does the patient have difficulty concentrating, remembering, or making decisions?: No Patient able to express need for assistance with ADLs?: Yes Does the patient have difficulty dressing or bathing?: Yes Independently performs ADLs?: Yes (appropriate for developmental age) Does the patient have difficulty walking or climbing stairs?: Yes Weakness of Legs: Left Weakness of Arms/Hands: None  Permission Sought/Granted Permission sought to share information with : Chartered certified accountant granted to share information with : Yes, Verbal Permission Granted              Emotional Assessment Appearance:: Appears stated age Attitude/Demeanor/Rapport: Engaged Affect (typically observed): Appropriate, Calm Orientation: : Oriented to Self, Oriented to Place, Oriented to  Time, Oriented to Situation Alcohol / Substance Use: Not Applicable Psych Involvement: No (comment)  Admission diagnosis:  Acute respiratory failure with hypoxia (HCC) [J96.01] Acute on chronic congestive heart failure, unspecified heart failure type [facility]) [I50.9] Patient Active Problem List   Diagnosis Date Noted  . Atypical atrial flutter ([place])   . Chest pain at rest 01/29/2019  . H/O mitral valve replacement with mechanical valve 01/29/2019  . Acute on chronic systolic CHF (  congestive heart failure) ([place]) 01/27/2019  . Atrial fibrillation, chronic 01/27/2019  . Cellulitis of left leg 01/27/2019  . Acute renal failure ([place]) 01/27/2019  . 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 Acute respiratory failure with hypoxia ([place]) 01/24/2019  . Acute on chronic congestive heart failure ([place])   . Pruritus 07/08/2018  . Edema  due to nutritional deficiency 07/08/2018  . Hypercholesterolemia 06/25/2016  . Postoperative anemia due to acute blood loss 06/08/2016  . S/P mitral valve replacement with mechanical valve + CABG x1 + Maze procedure 05/28/2016  . S/P CABG x 1 05/28/2016  . S/P Maze operation for atrial fibrillation 05/28/2016  . Chronic diastolic CHF (congestive heart failure) ([place])   . Coronary artery disease involving native coronary artery without angina pectoris 03/26/2016  . Second degree Mobitz I AV block 03/25/2016  . Tongue deviation   . Asymptomatic stenosis of right carotid artery s/p right CEA 03/10/16 03/10/2016  . Cerebral infarction due to embolism of left middle cerebral artery ([place]) 04/09/2014  . Valvular vegetation 04/09/2014  . Encounter for therapeutic drug monitoring 06/04/2013  . Paroxysmal atrial fibrillation ([place]) 04/19/2013  . Cardiac device in situ 04/19/2013  . Endocarditis 04/13/2013  . History of CVA (cerebrovascular accident) 04/12/2013  . Pulmonary hypertension ([place]) 04/06/2013  . S/P balloon mitral valvuloplasty 2008   . Essential hypertension 08/30/2012  . History of rheumatic fever 08/30/2012   PCP:  Debbrah Alar, NP Pharmacy:   [place], [place] [address] Phone: [phone] Fax: [phone]     Social Determinants of Health (SDOH) Interventions    Readmission Risk Interventions Readmission Risk Prevention Plan 01/30/2019  Transportation Screening Complete  Home Care Screening Complete  Medication Review (RN CM) Complete  Some recent data might be hidden

## 2019-02-05 NOTE — Progress Notes (Signed)
Physical Therapy Treatment Patient Details Name: Marie Malone MRN: [id number] DOB: 1950/09/22 Today's Date: 02/05/2019    History of Present Illness Marie Malone is a 68 y.o. 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   Work up includes acute respiratory failure with hypoxia, A/C CHF, and L LE pain due to cellulitis vs SIRS.    PT Comments    Pt was seen in attendance of daughter for bed ex as she is not feeling up to being OOB.  Pt is asking about going home, but given her limited participation in PT am still recommending rehab.  Her PT sessions to date have not involved any standing, no gait and no clear indications that pt is ready to transfer directly home. Continue to encourage her to work with PT on above plan, focusing on being OOB to chair.   Follow Up Recommendations  SNF     Equipment Recommendations  None recommended by PT    Recommendations for Other Services       Precautions / Restrictions Precautions Precautions: Fall Precaution Comments: skin breakdown LLE Restrictions Weight Bearing Restrictions: No    Mobility  Bed Mobility Overal bed mobility: Needs Assistance             General bed mobility comments: passive to assist pt to line up on bed and reposition LLE  Transfers                 General transfer comment: declined  Ambulation/Gait             General Gait Details: declined   Marine scientist [place] (Stroke Patients Only)       Balance                                            Cognition Arousal/Alertness: Awake/alert;Lethargic Behavior During Therapy: Flat affect Overall Cognitive Status: Within Functional Limits for tasks assessed                                 General  Comments: pt agreed to ex and daughter was present to see her mother interact with PT, no comments      Exercises General Exercises - Lower Extremity Ankle Circles/Pumps: AAROM;Both;5 reps Heel Slides: AAROM;Both;10 reps Hip ABduction/ADduction: AAROM;Both;10 reps Hip Flexion/Marching: AROM;AAROM;Both;10 reps    General Comments        Pertinent Vitals/Pain Pain Assessment: Faces Faces Pain Scale: Hurts whole lot Pain Location: L LE Pain Descriptors / Indicators: Grimacing Pain Intervention(s): Limited activity within patient's tolerance;Monitored during session;Premedicated before session;Repositioned    Home Living                      Prior Function            PT Goals (current goals can now be found in the care plan section) Acute Rehab PT Goals Patient Stated Goal: manage pain Progress towards PT goals: Not progressing toward goals - comment    Frequency    Min 3X/week      PT Plan Current plan remains appropriate  Co-evaluation              AM-PAC PT "6 Clicks" Mobility   Outcome Measure  Help needed turning from your back to your side while in a flat bed without using bedrails?: A Lot Help needed moving from lying on your back to sitting on the side of a flat bed without using bedrails?: A Lot Help needed moving to and from a bed to a chair (including a wheelchair)?: Total Help needed standing up from a chair using your arms (e.g., wheelchair or bedside chair)?: Total Help needed to walk in hospital room?: Total Help needed climbing 3-5 steps with a railing? : Total 6 Click Score: 8    End of Session Equipment Utilized During Treatment: Oxygen Activity Tolerance: Patient limited by pain Patient left: in bed;with call bell/phone within reach Nurse Communication: Mobility status PT Visit Diagnosis: Other abnormalities of gait and mobility (R26.89);Pain Pain - Right/Left: Left Pain - part of body: Leg     Time: 9201-0071 PT Time  Calculation (min) (ACUTE ONLY): 18 min  Charges:  $Therapeutic Exercise: 8-22 mins                    Ramond Dial 02/05/2019, 7:51 PM    Mee Hives, PT MS Acute Rehab Dept. Number: [place]

## 2019-02-06 DIAGNOSIS — N179 Acute kidney failure, unspecified: Secondary | ICD-10-CM

## 2019-02-06 LAB — CBC
HCT: 29.4 % — ABNORMAL LOW (ref 36.0–46.0)
Hemoglobin: 10.3 g/dL — ABNORMAL LOW (ref 12.0–15.0)
MCH: 29.2 pg (ref 26.0–34.0)
MCHC: 35 g/dL (ref 30.0–36.0)
MCV: 83.3 fL (ref 80.0–100.0)
Platelets: 136 10*3/uL — ABNORMAL LOW (ref 150–400)
RBC: 3.53 MIL/uL — ABNORMAL LOW (ref 3.87–5.11)
RDW: 17.3 % — ABNORMAL HIGH (ref 11.5–15.5)
WBC: 17 10*3/uL — ABNORMAL HIGH (ref 4.0–10.5)
nRBC: 0.1 % (ref 0.0–0.2)

## 2019-02-06 LAB — RENAL FUNCTION PANEL
Albumin: 2 g/dL — ABNORMAL LOW (ref 3.5–5.0)
Anion gap: 12 (ref 5–15)
BUN: 85 mg/dL — ABNORMAL HIGH (ref 8–23)
CO2: 21 mmol/L — ABNORMAL LOW (ref 22–32)
Calcium: 8.4 mg/dL — ABNORMAL LOW (ref 8.9–10.3)
Chloride: 97 mmol/L — ABNORMAL LOW (ref 98–111)
Creatinine, Ser: 2.99 mg/dL — ABNORMAL HIGH (ref 0.44–1.00)
GFR calc Af Amer: 18 mL/min — ABNORMAL LOW (ref >60)
GFR calc non Af Amer: 15 mL/min — ABNORMAL LOW (ref >60)
Glucose, Bld: 85 mg/dL (ref 70–99)
Phosphorus: 3 mg/dL (ref 2.5–4.6)
Potassium: 5 mmol/L (ref 3.5–5.1)
Sodium: 130 mmol/L — ABNORMAL LOW (ref 135–145)

## 2019-02-06 LAB — CULTURE, BLOOD (ROUTINE X 2)
Culture: NO GROWTH
Culture: NO GROWTH
Special Requests: ADEQUATE
Special Requests: ADEQUATE

## 2019-02-06 LAB — PROTIME-INR
INR: 5.8 (ref 0.8–1.2)
Prothrombin Time: 51.1 seconds — ABNORMAL HIGH (ref 11.4–15.2)

## 2019-02-06 LAB — TYPE AND SCREEN
ABO/RH(D): A POS
Antibody Screen: NEGATIVE

## 2019-02-06 LAB — HEMOGLOBIN AND HEMATOCRIT, BLOOD
HCT: 26.6 % — ABNORMAL LOW (ref 36.0–46.0)
HCT: 28.3 % — ABNORMAL LOW (ref 36.0–46.0)
Hemoglobin: 9.4 g/dL — ABNORMAL LOW (ref 12.0–15.0)
Hemoglobin: 10 g/dL — ABNORMAL LOW (ref 12.0–15.0)

## 2019-02-06 LAB — UREA NITROGEN, URINE: Urea Nitrogen, Ur: 243 mg/dL

## 2019-02-06 MED ORDER — PANTOPRAZOLE SODIUM 40 MG IV SOLR
40.0000 mg | Freq: Two times a day (BID) | INTRAVENOUS | Status: DC
  Administered 2019-02-06 – 2019-02-10 (×8): 40 mg via INTRAVENOUS
  Filled 2019-02-06 (×9): qty 40

## 2019-02-06 MED ORDER — PHYTONADIONE 1 MG/0.5 ML ORAL SOLUTION
1.0000 mg | Freq: Once | ORAL | Status: AC
  Administered 2019-02-06: 1 mg via ORAL
  Filled 2019-02-06 (×2): qty 0.5

## 2019-02-06 NOTE — Progress Notes (Addendum)
PROGRESS NOTE    Marie Malone  EXB:[id number] DOB: Apr 28, 1951 DOA: 01/22/2019 PCP: Debbrah Alar, NP   Brief Narrative:  Marie Daniel Lipfordis Shelley Pooley 68 y.o. 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 Patient states over the last 2 weeks patient has been getting progressively short of breath on exertion. Denies any chest pain fever chills or productive cough. Patient's primary care physician advised her to increase her Lasix twice daily which she is supposed to start today. Because of worsening shortness of breath patient came to the ER. EMS was called and patient initially was placed on BiPAP. Patient also had complained of some left calf cramping.  ED Course:In the ER patient was hypoxic placed on BiPAP chest x-ray shows features consistent with CHF. Lab work show Kenan Moodie BNP of 1699, sodium 139 potassium 4.2 creatinine 1.1 WBC count of 7.6 hemoglobin 12.6 platelets 82 and EKG was showing Aleasha Fregeau. fib rate at 106 bpm. Patient was given 60 mg IV Lasix following which patient was able to be weaned off BiPAP and presently is on 5 L oxygen. Patient admitted for further management of acute CHF. COVID-19 test was negative. On exam patient has good peripheral pulses with some edema in the lower extremity.  She was admitted with acute hypoxic resp failure and concern for volume overload.  She was diuresed and her lasix was held after her creatinine bumped.  She was noted to have LLE cellulitis and was started on abx.  Her LLE edema has been slow to improve.  She developed worsening AKI after her lasix was restarted.  Cardiology and nephrology are following.   Assessment & Plan:   Principal Problem:   Acute respiratory failure with hypoxia (HCC) Active Problems:   Essential hypertension   Pulmonary hypertension (HCC)   Paroxysmal atrial  fibrillation (HCC)   S/P mitral valve replacement with mechanical valve + CABG x1 + Maze procedure   S/P CABG x 1   Acute on chronic systolic CHF (congestive heart failure) (HCC)   Atrial fibrillation, chronic   Cellulitis of left leg   Acute renal failure ([place])   Primary biliary cirrhosis (HCC)   Chest pain at rest   H/O mitral valve replacement with mechanical valve   Atypical atrial flutter ([place])  Addendum: Developed BRBPR on 9/29.  Vitals stable.  BP in 90's (stable), HR stable.  Will give vitamin k 1 g x 1 and follow.  Start PPI BID.  C/s GI -> [facility] to see.  LLE cellulitis - Blood culture, urine culture NGTD  - leukocytosis was improving, but now stable - follow procalcitonin (downtrending as of 9/25) - vanc/ceftriaxone - narrowed to ancef per ID - ID c/s due to slow to improve -> improved on ancef - recommending keflex at discharge (9/25) - Patient also with loose stools (possibly from Ursodiol) however patient has been chronically on this medication. - lactoferrin negative and c diff negative - she has significant LLE pain when attempting to stand - will adjust pain regimen, continue abx, and follow - continued LE swelling, but improved warmth and pain - continued limited improvement today- continue to elevate - discussed with nursing to elevated legs, loose dressing  LLE Swelling - suspect this is due to above - elevate LE above heart, continue to monitor - given significant LLE edema, more than expected, CT abdomen/pelvis without contrast obtained on  9/28 to eval for mass affecting venous return - notable for cirrhosis, anasarca - see 9/28 CT report - some concern for posterior knee bleed vs compartment syndrome on 9/22 -> but low suspicion for this based on imaging and exam with soft compartments - CT was not suggestive of hematoma, but did have marked skin thickening and subcutaneous edema - 9/21 L LE cellulitis slightly worse today, ? secondary to hypoperfusion with  low BP -9/22 LLE fullness and pain increasing obtain CT LLE, given patient's supratherapeutic INR evaluate for bleeding and to left posterior knee joint and calf - LLE [place] from 9/22 read an notable for "significant fluid in posterior calf, unclear etiology" - discussed this with vascular to ensure this did not appear to be fluid collection and he noted appeared c/w edema - pt with good distal pulses, will hold off on vascular c/s  Acute renal failure (baseline Cr 1.0)  NAGMA     -creatinine 2.99  today - this is after restarting diuresis  - Hold IVF today.  She does have LE edema bilaterally - L>R and appears hypovolemic.  She has low albumin which is likely contributing. - follow urinalysis -> neg nitrite/LE - 11-20 WBC, but contaminated with squams - follow urine lytes (urine sodium is low) - renal US wnl - Renal c/s - suspect intravascular volume contraction - holding diuretics at this time  Acute respiratory failure with hypoxia -Not on home O2 -Currently on room air  Chest pain acute started 9/21  Elevated Troponin - Patient borderline hypotensive with MAP 60-63 - Obtain 12-lead EKG; atrial flutter, nonspecific ST-T wave changes no changes from previous EKG - Troponin high-sensitivity 791 -> 718 - cards following and aware - no plans for additional ischemic workup at this point  Acute on chronic systolic CHF -Strict in and out  -Daily weight                  - EF 40 to 45% see echocardiogram results below - 9/20 decreased metoprolol XR 12.5 mg daily (patient's BP on the hypotensive side) - Cardiology c/s - appreciate recs   Mechanical mitral valve - Continue warfarin, monitor INR     -Therapeutic INR goal 2.5-3.5 -Supratherapeutic today (5.8).  Warfarin on hold again today.  Ancef can increase anticoagulant effect of warfarin.  Appreciate pharmacy assistance. - Hold aspirin with supratherapeutic INR - s/p reversal for rising INR on 9/22.  Will hold off on repeat  vitamin k for now with no signs of bleeding (and last time given vitamin K became subtherapeutic).   Atrial fibrillation chronic - Currently NSR - Anticoagulated for her mechanical mitral valve - Consider DCCV prior to d/c (vs TEE/DCCV if pt INR becomes subtherapeutic)  - per cards, going to discuss with EP given suspicion for atach   Primary biliary cirrhosis(PBC) - 9/22 restart Ursodial 300 mg BID.   - benadryl for itching  Diarrhea - 9/22 patient reports diarrhea to cardiology -Fecal fat wnl -GI pathogen panel by PCR negative  -Lactoferrin negative -Stool osmolality pending -Stool K/NA - this doesn't appear to have been ordered  Anemia: Hb slightly downtrending, follow PM - no si/sx bleeding  Hypotension: mild, continue to monitor with IVF.  SBP 90's - 100's this morning.  Goals of care - On 9/21 place PT/OT request patient was able to ambulate independently at home prior to this hospitalization.  Due to the severity of the LLE cellulitis unsure patient will be able to discharge home.  DVT prophylaxis:  warfarin (INR supratherapeutic today) Code Status: full  Family Communication: none at bedside - daughter over phone Disposition Plan: pending further improvement   Consultants:   cardiology  Procedures:  LE [place] Summary: Right: There is no evidence of deep vein thrombosis in the lower extremity. However, portions of this examination were limited- see technologist comments above. Left: There is no evidence of deep vein thrombosis in the lower extremity. However, portions of this examination were limited- see technologist comments above. Significant fluid noted in the posterior calf, etiology unknown.   *See table(s) above for measurements and observations.  Electronically signed by Deitra Mayo MD on 01/30/2019 at 9:32:51 AM.  Echo IMPRESSIONS    1. Left ventricular ejection fraction, by visual estimation, is 45 to 50%. The left ventricle has mildly  decreased function. Normal left ventricular size. Left ventricular septal wall thickness was normal. Normal left ventricular posterior wall  thickness. There is no left ventricular hypertrophy.  2. Indeterminate diastolic function due to mechanical mitral valve pattern of LV diastolic filling.  3. Right ventricular pressure overload and right ventricular volume overload.  4. Global right ventricle has moderately reduced systolic function.The right ventricular size is moderately enlarged. No increase in right ventricular wall thickness.  5. Left atrial size was moderately dilated.  6. Right atrial size was severely dilated.  7. The mitral valve has been repaired/replaced. No evidence of mitral valve regurgitation. No evidence of mitral stenosis.  8. The tricuspid valve is normal in structure. Tricuspid valve regurgitation mild-moderate.  9. The aortic valve The aortic valve is normal in structure. Aortic valve regurgitation was not visualized by color flow Doppler. Structurally normal aortic valve, with no evidence of sclerosis or stenosis. 10. The pulmonic valve was normal in structure. Pulmonic valve regurgitation is mild by color flow Doppler. 11. Moderately elevated pulmonary artery systolic pressure. 12. The inferior vena cava is dilated in size with <50% respiratory variability, suggesting right atrial pressure of 15 mmHg.  Antimicrobials:  Anti-infectives (From admission, onward)   Start     Dose/Rate Route Frequency Ordered Stop   02/05/19 1947  ceFAZolin (ANCEF) IVPB 2g/100 mL premix     2 g 200 mL/hr over 30 Minutes Intravenous Every 12 hours 02/05/19 1948     02/04/19 1730  ceFAZolin (ANCEF) IVPB 2g/100 mL premix  Status:  Discontinued     2 g 200 mL/hr over 30 Minutes Intravenous Every 12 hours 02/04/19 0948 02/05/19 1948   02/01/19 2000  ceFAZolin (ANCEF) IVPB 2g/100 mL premix  Status:  Discontinued     2 g 200 mL/hr over 30 Minutes Intravenous Every 8 hours 02/01/19 1524  02/04/19 0948   02/01/19 1300  vancomycin (VANCOCIN) IVPB 750 mg/150 ml premix  Status:  Discontinued     750 mg 150 mL/hr over 60 Minutes Intravenous Every 24 hours 01/31/19 1152 02/01/19 1519   01/31/19 1300  vancomycin (VANCOCIN) 1,250 mg in sodium chloride 0.9 % 250 mL IVPB     1,250 mg 166.7 mL/hr over 90 Minutes Intravenous  Once 01/31/19 1152 01/31/19 1448   01/27/19 0930  cefTRIAXone (ROCEPHIN) 2 g in sodium chloride 0.9 % 100 mL IVPB  Status:  Discontinued     2 g 200 mL/hr over 30 Minutes Intravenous Every 24 hours 01/27/19 0824 02/01/19 1519      Subjective: Feels her leg pain is slightly better Thinks it may look better as well Discussed poc with daughter over phoe Objective: Vitals:   02/05/19 2021 02/06/19 4650 02/06/19 3546  02/06/19 0857  BP:  113/63 98/62 106/67  Pulse:  87 91 90  Resp:  14 16   Temp: (!) 97.2 F (36.2 C)  (!) 97.4 F (36.3 C)   TempSrc: Oral     SpO2:  98% 96%   Weight:   77.5 kg   Height:        Intake/Output Summary (Last 24 hours) at 02/06/2019 1324 Last data filed at 02/06/2019 0900 Gross per 24 hour  Intake 1070 ml  Output 100 ml  Net 970 ml   Filed Weights   02/04/19 0532 02/05/19 0355 02/06/19 0635  Weight: 72.4 kg 74.1 kg 77.5 kg    Examination:  General: No acute distress. Cardiovascular: Heart sounds show Sammantha Mehlhaff regular rate, and rhythm. Lungs: Clear to auscultation bilaterally Abdomen: Soft, nontender, nondistended w Neurological: Alert and oriented 3. Moves all extremities 4. Cranial nerves II through XII grossly intact. Skin: Warm and dry. No rashes or lesions. Extremities: significant LLE edema - about the same as yesterday, maybe minimally improved - blister ruptured to L calf         Data Reviewed: I have personally reviewed following labs and imaging studies  CBC: Recent Labs  Lab 02/03/19 0323 02/04/19 0641 02/04/19 1631 02/05/19 0819 02/05/19 1630 02/06/19 0314  WBC 16.2* 15.3*  --  17.6* 16.3*  17.0*  NEUTROABS  --   --   --   --  15.2*  --   HGB 10.9* 9.8* 9.8* 9.8* 10.9* 10.3*  HCT 32.4* 28.7* 28.9* 29.5* 32.2* 29.4*  MCV 84.8 84.9  --  86.3 83.9 83.3  PLT 147* 127*  --  139* 129* 509*   Basic Metabolic Panel: Recent Labs  Lab 02/01/19 0248 02/02/19 0342 02/03/19 0323 02/04/19 0641 02/05/19 0819 02/06/19 0314  NA 138 136 133* 133* 134* 130*  K 3.6 3.9 4.3 4.4 5.0 5.0  CL 107 105 100 100 97* 97*  CO2 20* 20* 21* 21* 22 21*  GLUCOSE 120* 106* 114* 108* 93 85  BUN 59* 62* 65* 79* 83* 85*  CREATININE 1.48* 1.52* 1.66* 2.32* 2.71* 2.99*  CALCIUM 8.4* 8.5* 8.4* 8.4* 8.4* 8.4*  MG 2.4 2.3 2.3 2.3 2.3  --   PHOS  --   --   --   --   --  3.0   GFR: Estimated Creatinine Clearance: 17.6 mL/min (Cheikh Bramble) (by C-G formula based on SCr of 2.99 mg/dL (H)). Liver Function Tests: Recent Labs  Lab 02/01/19 0248 02/02/19 0342 02/03/19 0323 02/04/19 0641 02/05/19 0819 02/06/19 0314  AST 34 39 40 38 44*  --   ALT 13 15 10 6  <5  --   ALKPHOS 90 80 85 75 76  --   BILITOT 1.8* 2.0* 1.4* 1.4* 1.7*  --   PROT 7.3 7.9 8.2* 8.3* 8.7*  --   ALBUMIN 2.2* 2.2* 2.2* 2.1* 2.0* 2.0*   No results for input(s): LIPASE, AMYLASE in the last 168 hours. No results for input(s): AMMONIA in the last 168 hours. Coagulation Profile: Recent Labs  Lab 02/02/19 0342 02/03/19 0323 02/04/19 0641 02/05/19 0819 02/06/19 0314  INR 3.0* 4.5* 5.1* 4.9* 5.8*   Cardiac Enzymes: No results for input(s): CKTOTAL, CKMB, CKMBINDEX, TROPONINI in the last 168 hours. BNP (last 3 results) No results for input(s): PROBNP in the last 8760 hours. HbA1C: No results for input(s): HGBA1C in the last 72 hours. CBG: No results for input(s): GLUCAP in the last 168 hours. Lipid Profile: No results for input(s): CHOL,  HDL, LDLCALC, TRIG, CHOLHDL, LDLDIRECT in the last 72 hours. Thyroid Function Tests: No results for input(s): TSH, T4TOTAL, FREET4, T3FREE, THYROIDAB in the last 72 hours. Anemia Panel: No results for  input(s): VITAMINB12, FOLATE, FERRITIN, TIBC, IRON, RETICCTPCT in the last 72 hours. Sepsis Labs: Recent Labs  Lab 01/31/19 1927 02/01/19 0248 02/02/19 0342  PROCALCITON 2.09 2.37 1.97    Recent Results (from the past 240 hour(s))  Culture, Urine     Status: Abnormal   Collection Time: 01/28/19  6:40 PM   Specimen: Urine, Random  Result Value Ref Range Status   Specimen Description URINE, RANDOM  Final   Special Requests NONE  Final   Culture (Raelynne Ludwick)  Final    <10,000 COLONIES/mL INSIGNIFICANT GROWTH Performed at [facility], [address], [place], [place] [address]    Report Status 01/29/2019 FINAL  Final  Culture, blood (routine x 2)     Status: None   Collection Time: 02/01/19  8:50 AM   Specimen: BLOOD  Result Value Ref Range Status   Specimen Description BLOOD LEFT ANTECUBITAL  Final   Special Requests   Final    BOTTLES DRAWN AEROBIC AND ANAEROBIC Blood Culture adequate volume   Culture   Final    NO GROWTH 5 DAYS Performed at [facility], [place] [address], [place], [place] [address]    Report Status 02/06/2019 FINAL  Final  Culture, blood (routine x 2)     Status: None   Collection Time: 02/01/19  8:55 AM   Specimen: BLOOD RIGHT HAND  Result Value Ref Range Status   Specimen Description BLOOD RIGHT HAND  Final   Special Requests   Final    BOTTLES DRAWN AEROBIC AND ANAEROBIC Blood Culture adequate volume   Culture   Final    NO GROWTH 5 DAYS Performed at [facility], [place] [address], [place], [place] [address]    Report Status 02/06/2019 FINAL  Final         Radiology Studies: Ct Abdomen Pelvis Wo Contrast  Result Date: 02/05/2019 CLINICAL DATA:  Lower extremity edema, history of primary biliary cholangitis EXAM: CT ABDOMEN AND PELVIS WITHOUT CONTRAST TECHNIQUE: Multidetector CT imaging of the abdomen and pelvis was performed following the standard protocol without IV contrast. COMPARISON:  CT 08/09/2017 FINDINGS: Lower chest: Small  bilateral effusions. Central vascular crowding is noted. Punctate granuloma noted right lung base. There is Byrd Terrero prior mitral valve replacement and atrial occlusion device noted over the cardiac silhouette. There is extensive severe coronary artery calcification as well as dense coronary calcification of the aortic root. Post CABG changes are noted as well with multiple sternal wires which appear intact. Hepatobiliary: Slightly nodular opacity surface contour. No visible or contour deforming hepatic lesions. No frank gallbladder wall thickening. No visible gallstones. Slight prominence of the biliary tree is nonspecific given patient age. Pancreas: Unremarkable. No pancreatic ductal dilatation or surrounding inflammatory changes. Spleen: Normal in size without focal abnormality. Adrenals/Urinary Tract: Adrenal glands are unremarkable. Kidneys are normal, without renal calculi, visible renal lesion, or hydronephrosis. Bladder is unremarkable. Stomach/Bowel: Ingested contrast traverses to the level of the transverse colon. Distal esophagus, stomach and duodenal sweep are unremarkable. No bowel wall thickening or dilatation. No evidence of obstruction. Patient chart indicates history of appendectomy. Insert colon surgical clip noted near the rectosigmoid junction. Vascular/Lymphatic: Extensive severe atherosclerotic calcification. Luminal evaluation is not possible in the absence of contrast. Multiple Ezrael Sam prominent though nonenlarged lymph nodes are present throughout  the abdomen and groins. No pathologically enlarged nodes in the imaged chains. Reproductive: Retroverted uterus. Few calcified fibroids. No concerning adnexal lesions are visualized. Other: There is extensive severe body wall edema most pronounced over the left lateral abdominal wall and both flanks. There is Imran Nuon moderate volume of low-attenuation ascites in the abdomen as well. No free intraperitoneal air. Few surgical clips are seen in the anterior abdomen and  low pelvis. Musculoskeletal: Body wall edema, as above. Mild muscle atrophy. Multilevel degenerative changes are present in the imaged portions of the spine. Most pronounced findings are present at L5-S1 IMPRESSION: 1. Slightly nodular opacity surface contour of the liver, suggestive of cirrhosis and compatible with history of primary biliary cholangitis. No visible or contour deforming hepatic lesions. 2. Moderate volume of low-attenuation ascites in the abdomen and pelvis, and severe body wall edema, consistent with anasarca. 3. Extensive severe atherosclerotic calcification of the aorta and its branches. Luminal evaluation is not possible in the absence of contrast. 4. Small bilateral effusions. 5. Aortic Atherosclerosis (ICD10-I70.0). 6. Prior CABG, mitral valve replacement and aortic occlusion clip placement. 7. Leiomyomatous uterus. Electronically Signed   By: Lovena Le M.D.   On: 02/05/2019 23:19   US Renal  Result Date: 02/05/2019 CLINICAL DATA:  Acute kidney injury EXAM: RENAL / URINARY TRACT ULTRASOUND COMPLETE COMPARISON:  CT 08/09/2017 [facility] FINDINGS: Right Kidney: Renal measurements: 10.8 x 4.5 x 5.5 cm = volume: 141 mL . Echogenicity within normal limits. No mass or hydronephrosis visualized. Right renal scarring. Left Kidney: Renal measurements: 10.9 x 5.9 x 5.6 cm = volume: 190 mL. Echogenicity within normal limits. No mass or hydronephrosis visualized. Bladder: No focal abnormality.  Moderate amount of pelvic free fluid. IMPRESSION: 1. Normal renal ultrasound. 2. Moderate amount of pelvic free fluid. Electronically Signed   By: Kathreen Devoid   On: 02/05/2019 12:29        Scheduled Meds: . aspirin EC  81 mg Oral Daily  . atorvastatin  40 mg Oral q1800  . feeding supplement (ENSURE ENLIVE)  237 mL Oral BID BM  . metoprolol succinate  12.5 mg Oral Daily  . ursodiol  300 mg Oral BID  . Warfarin - Pharmacist Dosing Inpatient   Does not apply q1800   Continuous  Infusions: .  ceFAZolin (ANCEF) IV 2 g (02/06/19 0909)     LOS: 13 days    Time spent: over 74 min    Fayrene Helper, MD [facility] Pager AMION  If 7PM-7AM, please contact night-coverage [email or web address] Password [email or web address] 02/06/2019, 1:24 PM

## 2019-02-06 NOTE — Progress Notes (Addendum)
Progress Note  Patient Name: Marie Malone Date of Encounter: 02/06/2019  Primary Cardiologist: [name], MD   Subjective   Left leg pain is better today. Denies chest pain or palpitations.   Inpatient Medications    Scheduled Meds:  aspirin EC  81 mg Oral Daily   atorvastatin  40 mg Oral q1800   feeding supplement (ENSURE ENLIVE)  237 mL Oral BID BM   metoprolol succinate  12.5 mg Oral Daily   ursodiol  300 mg Oral BID   Warfarin - Pharmacist Dosing Inpatient   Does not apply q1800   Continuous Infusions:   ceFAZolin (ANCEF) IV 2 g (02/05/19 2004)   PRN Meds: diphenhydrAMINE, docusate sodium, morphine injection, ondansetron **OR** ondansetron (ZOFRAN) IV, oxyCODONE **OR** oxyCODONE   Vital Signs    Vitals:   02/05/19 1257 02/05/19 2021 02/06/19 0303 02/06/19 0635  BP: 104/61  113/63 98/62  Pulse: 96  87 91  Resp: 18  14 16   Temp: 97.9 F (36.6 C) (!) 97.2 F (36.2 C)  (!) 97.4 F (36.3 C)  TempSrc: Oral Oral    SpO2: 92%  98% 96%  Weight:    77.5 kg  Height:        Intake/Output Summary (Last 24 hours) at 02/06/2019 0856 Last data filed at 02/06/2019 0304 Gross per 24 hour  Intake 1020 ml  Output 100 ml  Net 920 ml   Last 3 Weights 02/06/2019 02/05/2019 02/04/2019  Weight (lbs) 170 lb 13.7 oz 163 lb 5.8 oz 159 lb 9.8 oz  Weight (kg) 77.5 kg 74.1 kg 72.4 kg      Telemetry   2:1 aflutter, HR 90s, first degree AV block (PRI 0.27s) - Personally Reviewed  ECG    2:1 aflutter, 93 bpm - Personally Reviewed  Physical Exam   GEN: No acute distress.   Neck: No JVD Cardiac: RRR, systolic murmur, rubs, or gallops.  Respiratory: Clear to auscultation bilaterally. GI: Soft, nontender, non-distended  MS: 1+ edema right leg, 2+ left leg; No deformity. Neuro:  Nonfocal  Psych: Normal affect   Labs    High Sensitivity Troponin:   Recent Labs  Lab 01/24/19 0259 01/24/19 0738 01/24/19 0807 01/29/19 1635 01/29/19 1835  TROPONINIHS 132*  111* 113* 791* 718*      Chemistry Recent Labs  Lab 02/03/19 0323 02/04/19 0641 02/05/19 0819 02/06/19 0314  NA 133* 133* 134* 130*  K 4.3 4.4 5.0 5.0  CL 100 100 97* 97*  CO2 21* 21* 22 21*  GLUCOSE 114* 108* 93 85  BUN 65* 79* 83* 85*  CREATININE 1.66* 2.32* 2.71* 2.99*  CALCIUM 8.4* 8.4* 8.4* 8.4*  PROT 8.2* 8.3* 8.7*  --   ALBUMIN 2.2* 2.1* 2.0* 2.0*  AST 40 38 44*  --   ALT 10 6 <5  --   ALKPHOS 85 75 76  --   BILITOT 1.4* 1.4* 1.7*  --   GFRNONAA 32* 21* 17* 15*  GFRAA 37* 24* 20* 18*  ANIONGAP 12 12 15 12      Hematology Recent Labs  Lab 02/05/19 0819 02/05/19 1630 02/06/19 0314  WBC 17.6* 16.3* 17.0*  RBC 3.42* 3.84* 3.53*  HGB 9.8* 10.9* 10.3*  HCT 29.5* 32.2* 29.4*  MCV 86.3 83.9 83.3  MCH 28.7 28.4 29.2  MCHC 33.2 33.9 35.0  RDW 17.2* 17.1* 17.3*  PLT 139* 129* 136*    BNPNo results for input(s): BNP, PROBNP in the last 168 hours.   DDimer No results for  input(s): DDIMER in the last 168 hours.   Radiology    Ct Abdomen Pelvis Wo Contrast  Result Date: 02/05/2019 CLINICAL DATA:  Lower extremity edema, history of primary biliary cholangitis EXAM: CT ABDOMEN AND PELVIS WITHOUT CONTRAST TECHNIQUE: Multidetector CT imaging of the abdomen and pelvis was performed following the standard protocol without IV contrast. COMPARISON:  CT 08/09/2017 FINDINGS: Lower chest: Small bilateral effusions. Central vascular crowding is noted. Punctate granuloma noted right lung base. There is a prior mitral valve replacement and atrial occlusion device noted over the cardiac silhouette. There is extensive severe coronary artery calcification as well as dense coronary calcification of the aortic root. Post CABG changes are noted as well with multiple sternal wires which appear intact. Hepatobiliary: Slightly nodular opacity surface contour. No visible or contour deforming hepatic lesions. No frank gallbladder wall thickening. No visible gallstones. Slight prominence of the  biliary tree is nonspecific given patient age. Pancreas: Unremarkable. No pancreatic ductal dilatation or surrounding inflammatory changes. Spleen: Normal in size without focal abnormality. Adrenals/Urinary Tract: Adrenal glands are unremarkable. Kidneys are normal, without renal calculi, visible renal lesion, or hydronephrosis. Bladder is unremarkable. Stomach/Bowel: Ingested contrast traverses to the level of the transverse colon. Distal esophagus, stomach and duodenal sweep are unremarkable. No bowel wall thickening or dilatation. No evidence of obstruction. Patient chart indicates history of appendectomy. Insert colon surgical clip noted near the rectosigmoid junction. Vascular/Lymphatic: Extensive severe atherosclerotic calcification. Luminal evaluation is not possible in the absence of contrast. Multiple a prominent though nonenlarged lymph nodes are present throughout the abdomen and groins. No pathologically enlarged nodes in the imaged chains. Reproductive: Retroverted uterus. Few calcified fibroids. No concerning adnexal lesions are visualized. Other: There is extensive severe body wall edema most pronounced over the left lateral abdominal wall and both flanks. There is a moderate volume of low-attenuation ascites in the abdomen as well. No free intraperitoneal air. Few surgical clips are seen in the anterior abdomen and low pelvis. Musculoskeletal: Body wall edema, as above. Mild muscle atrophy. Multilevel degenerative changes are present in the imaged portions of the spine. Most pronounced findings are present at L5-S1 IMPRESSION: 1. Slightly nodular opacity surface contour of the liver, suggestive of cirrhosis and compatible with history of primary biliary cholangitis. No visible or contour deforming hepatic lesions. 2. Moderate volume of low-attenuation ascites in the abdomen and pelvis, and severe body wall edema, consistent with anasarca. 3. Extensive severe atherosclerotic calcification of the aorta  and its branches. Luminal evaluation is not possible in the absence of contrast. 4. Small bilateral effusions. 5. Aortic Atherosclerosis (ICD10-I70.0). 6. Prior CABG, mitral valve replacement and aortic occlusion clip placement. 7. Leiomyomatous uterus. Electronically Signed   By: Lovena Le M.D.   On: 02/05/2019 23:19   US Renal  Result Date: 02/05/2019 CLINICAL DATA:  Acute kidney injury EXAM: RENAL / URINARY TRACT ULTRASOUND COMPLETE COMPARISON:  CT 08/09/2017 [facility] FINDINGS: Right Kidney: Renal measurements: 10.8 x 4.5 x 5.5 cm = volume: 141 mL . Echogenicity within normal limits. No mass or hydronephrosis visualized. Right renal scarring. Left Kidney: Renal measurements: 10.9 x 5.9 x 5.6 cm = volume: 190 mL. Echogenicity within normal limits. No mass or hydronephrosis visualized. Bladder: No focal abnormality.  Moderate amount of pelvic free fluid. IMPRESSION: 1. Normal renal ultrasound. 2. Moderate amount of pelvic free fluid. Electronically Signed   By: Kathreen Devoid   On: 02/05/2019 12:29    Cardiac Studies    Echo 01/24/19 1. Left ventricular ejection fraction,  by visual estimation, is 45 to 50%. The left ventricle has mildly decreased function. Normal left ventricular size. Left ventricular septal wall thickness was normal. Normal left ventricular posterior wall  thickness. There is no left ventricular hypertrophy. 2. Indeterminate diastolic function due to mechanical mitral valve pattern of LV diastolic filling. 3. Right ventricular pressure overload and right ventricular volume overload. 4. Global right ventricle has moderately reduced systolic function.The right ventricular size is moderately enlarged. No increase in right ventricular wall thickness. 5. Left atrial size was moderately dilated. 6. Right atrial size was severely dilated. 7. The mitral valve has been repaired/replaced. No evidence of mitral valve regurgitation. No evidence of mitral  stenosis. 8. The tricuspid valve is normal in structure. Tricuspid valve regurgitation mild-moderate. 9. The aortic valve The aortic valve is normal in structure. Aortic valve regurgitation was not visualized by color flow Doppler. Structurally normal aortic valve, with no evidence of sclerosis or stenosis. 10. The pulmonic valve was normal in structure. Pulmonic valve regurgitation is mild by color flow Doppler. 11. Moderately elevated pulmonary artery systolic pressure. 12. The inferior vena cava is dilated in size with <50% respiratory variability, suggesting right atrial pressure of 15 mmHg.  Right and left cardiac catheterization 03/25/2016:   left ventricular systolic function is normal.  The left ventricular ejection fraction is 55-65% by visual estimate.  There is no aortic valve stenosis.  There is moderate mitral valve stenosis. Mean gradient 10 mm Hg. MV area 1.3 cm2  Mid Cx to Dist Cx lesion, 10 %stenosed.  Ost 1st Mrg to 1st Mrg lesion, 20 %stenosed.  Prox RCA lesion, 100 %stenosed. Left to right and right to right collaterals are present.  Hemodynamic findings consistent with moderate pulmonary hypertension and mitral valve stenosis.  LV end diastolic pressure is normal.  Patient Profile     68 y.o. female with a history of rheumatic fever/mitral valve stenosis s/p valvuloplasty at [facility] in 2006, CAD s/p CABG, mechanical MVR, paroxysmal afib with MAZE procedure in 2018 on coumadin, PAD s/p endarterectomy in 03/2016 who was admitted on 9/16 for acute heart failure complicated by aflutter.   Assessment & Plan    Acute on Chronic combined systolic and diastolic heart failure Patient presented 9/15 for sob. BNP elevated to 1600. CXR with pulmonary edema and small right pleural effusion - Decompensation possibly from poor diet and aflutter - Echo showed EF 45-50% with right ventricular dysfunction - Was on IV lasix but has been stopped for worsening renal  function. - Creatinine 2.23 > 2.71 > 2.99 . Arb was discontinued last week. US renal negative for obstruction. CT abdomen and pelvis ordered. Nephrology following.  - Weight and I/Os not reflective of diuresis  - Continue BB - Holding diuretics given worsening renal function  2:1 aflutter/Chronic afib with h/o of maze procedure in 2018 Patient was in afib on admission and was thought to have converted to NSR vs 2:1 aflutter vs 2:1 atach - After discussing with EP rhythm appears patient is in 2:1 [place] = 4 ( age, female, CHF, vascular dz) - Continue metoprolol for rate control - INR 5.8 today. Pharmacy following management of coumadin. Since INR continues to rise despite holding warfarin can consider giving Vitamin K - Patient will likely need TEE/DCCV being that patient is in aflutter. Will defer at this time given cellulitis and acute AKI  LLE cellulitis with swelling - Left leg pain is improved - WBC 17 - Swelling of the left leg is worse from  cellulitis. Ct negative for hematoma. Per Vascular more consistent with edema - abx per ID - Once infection is better plan for TEE/DCCV  AKI - Renal function continues to worsen. 2.99 today - US renal negative for obstruction - CT abd/pelvis revealed Moderate volume of low-attenuation ascites in the abdomen and pelvis, and severe body wall edema, consistent with anasarca - Nephrology following >> AKI appears oliguric. 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  Agree with above documentation.  On exam, patient is alert and oriented, lungs are clear to auscultation bilaterally, regular rate and rhythm, bilateral lower extremity edema, no JVD.  She denies any chest pain or shortness of breath.  Telemetry personally reviewed, she continues to be in a 2:1 Aflutter.  We will plan for TEE/DCCV prior to discharge.  Renal function continues to worsen, creatinine up to 2.99 today.  Holding diuretics.  Nephrology following.  INR up to 5.8, no evidence of bleeding.  Continuing to hold warfarin.

## 2019-02-06 NOTE — Plan of Care (Signed)
  Problem: Coping: Goal: Level of anxiety will decrease Outcome: Progressing   Problem: Pain Managment: Goal: General experience of comfort will improve Outcome: Progressing   Problem: Safety: Goal: Ability to remain free from injury will improve Outcome: Progressing   

## 2019-02-06 NOTE — Progress Notes (Signed)
ANTICOAGULATION CONSULT NOTE  Pharmacy Consult for warfarin Indication: atrial fibrillation and mechanical MVR  Vital Signs: Temp: 97.4 F (36.3 C) (09/29 0635) BP: 94/57 (09/29 1349) Pulse Rate: 89 (09/29 1349)  Labs: Recent Labs    02/04/19 0641  02/05/19 0819 02/05/19 1630 02/06/19 0314  HGB 9.8*   < > 9.8* 10.9* 10.3*  HCT 28.7*   < > 29.5* 32.2* 29.4*  PLT 127*  --  139* 129* 136*  LABPROT 45.9*  --  44.7*  --  51.1*  INR 5.1*  --  4.9*  --  5.8*  CREATININE 2.32*  --  2.71*  --  2.99*   < > = values in this interval not displayed.    Estimated Creatinine Clearance: 17.6 mL/min (A) (by C-G formula based on SCr of 2.99 mg/dL (H)).  Assessment: 68 y.o. female admitted with CHF/SOB, h/o Afib and mechanical MVR, to continue warfarin. PTA regimen is 4 mg daily. INR has been elevated likely due to vitamin K deficiency.1mg  IV vitamin K given on 9/22.  Outpatient INR goal noted to be 2-3. And the goal was changed to 2.5-3.5 this admission.   INR is still elevated this morning at 5.8. No bleeding issues noted.   Goal of Therapy:  INR 2.5-3.5 Monitor platelets by anticoagulation protocol: Yes   Plan:  -Will continue to hold warfarin   -Daily INR and CBC   Thank you,  Anette Guarneri, PharmD  02/06/2019 3:43 PM    PM addendum: Damaris Schooner to Dr Bertram [place] some BRBPR today - will give Vit K 1mg  po x1 And continue to aim for INR goal  If INR falls < 2.5 - please start heparin to cover valve until INR back to goal  [place].D. CPP, BCPS Clinical Pharmacist [phone] 02/06/2019 3:44 PM

## 2019-02-06 NOTE — Progress Notes (Signed)
ANTICOAGULATION CONSULT NOTE  Pharmacy Consult for warfarin Indication: atrial fibrillation and mechanical MVR  Vital Signs: Temp: 97.4 F (36.3 C) (09/29 0635) BP: 106/67 (09/29 0857) Pulse Rate: 90 (09/29 0857)  Labs: Recent Labs    02/04/19 0641  02/05/19 0819 02/05/19 1630 02/06/19 0314  HGB 9.8*   < > 9.8* 10.9* 10.3*  HCT 28.7*   < > 29.5* 32.2* 29.4*  PLT 127*  --  139* 129* 136*  LABPROT 45.9*  --  44.7*  --  51.1*  INR 5.1*  --  4.9*  --  5.8*  CREATININE 2.32*  --  2.71*  --  2.99*   < > = values in this interval not displayed.    Estimated Creatinine Clearance: 17.6 mL/min (A) (by C-G formula based on SCr of 2.99 mg/dL (H)).  Assessment: 68 y.o. female admitted with CHF/SOB, h/o Afib and mechanical MVR, to continue warfarin. PTA regimen is 4 mg daily. INR has been elevated likely due to vitamin K deficiency.1mg  IV vitamin K given on 9/22.  Outpatient INR goal noted to be 2-3. And the goal was changed to 2.5-3.5 this admission.   INR is still elevated this morning at 5.8. No bleeding issues noted.   Goal of Therapy:  INR 2.5-3.5 Monitor platelets by anticoagulation protocol: Yes   Plan:  -Will continue to hold warfarin   -Daily INR and CBC   Thank you,  Anette Guarneri, PharmD  02/06/2019 10:18 AM

## 2019-02-06 NOTE — Progress Notes (Signed)
Patient is having LLQ pain in addition to left leg pain.  Po medication has not provided relief the second time around.  I have given patient IV pain med to relieve her pain.  I will keep monitoring patient.

## 2019-02-06 NOTE — Progress Notes (Signed)
Discussed POC with patient's nurse. At this time patient has working PIV and no blood is ordered for infusion at this time. Discussed importance of vein preservation. VU. Fran Lowes, RN VAST

## 2019-02-06 NOTE — Progress Notes (Signed)
Patient ID: Marie Malone, female   DOB: [birth year], 68 y.o.   MRN: [id number] [facility] Progress Note   Assessment/ Plan:   1.  Acute kidney injury:  Anuric with a smaller rise of creatinine noted today.  Differential diagnosis include ATN and acute interstitial nephritis (AIN) with available labs favoring hemodynamically mediated AKI/ATN.    Renal ultrasound negative for any obstruction and abdominal/pelvic imaging showing ascites/abdominal wall edema.  Her urine sodium of <10 indicates that she likely has intravascular volume contraction in the setting of significant interstitial edema following recent treatment with diuretics (I suspect this has largely to do with pulmonary hypertension/right heart failure).  Because she is not in any respiratory distress or showing signs of pulmonary edema/hypoxemia, continue to hold diuretics at this time.  In the next 24 to 48 hours, if renal function plateaus, will attempt to augment diuresis with albumin/Lasix. 2.  Hypoxic respiratory failure: Secondary to acute exacerbation of diastolic heart failure in this patient with pulmonary hypertension.  Holding diuretics at this time to allow for "fluid equilibration". 3.  Left leg cellulitis:  No evidence of intra-abdominal/pelvic mass that could be interfering with left leg venous drainage.  Continue treatment for cellulitis per primary service. 4.  Anemia: Possibly secondary to chronic disease.  No overt loss noted.  Continue to monitor with ongoing warfarin therapy. 5.  History of mechanical mitral valve: On anticoagulation with warfarin 6.  History of primary biliary cirrhosis  Subjective:   Reports to be feeling somewhat better today with reduced left leg/abdominal pain.   Objective:   BP 106/67   Pulse 90   Temp (!) 97.4 F (36.3 C)   Resp 16   Ht 5\' 2"  (1.575 m)   Wt 77.5 kg   SpO2 96%   BMI 31.25 kg/m   Intake/Output Summary (Last 24 hours) at 02/06/2019 1113 Last data filed at  02/06/2019 0900 Gross per 24 hour  Intake 1070 ml  Output 100 ml  Net 970 ml   Weight change: 3.4 kg  Physical Exam: Gen: Appears to be comfortable resting in bed, left leg slightly elevated on pillow.  Sister at bedside CVS: Pulse regular rhythm, normal rate, normal S1 and S2 Resp: Decreased breath sounds over both bases, no distinct rhonchi Abd: Soft, slightly distended, bowel sounds normal Ext: 2+ left leg edema, trace-1+ right leg edema  Imaging: Ct Abdomen Pelvis Wo Contrast  Result Date: 02/05/2019 CLINICAL DATA:  Lower extremity edema, history of primary biliary cholangitis EXAM: CT ABDOMEN AND PELVIS WITHOUT CONTRAST TECHNIQUE: Multidetector CT imaging of the abdomen and pelvis was performed following the standard protocol without IV contrast. COMPARISON:  CT 08/09/2017 FINDINGS: Lower chest: Small bilateral effusions. Central vascular crowding is noted. Punctate granuloma noted right lung base. There is a prior mitral valve replacement and atrial occlusion device noted over the cardiac silhouette. There is extensive severe coronary artery calcification as well as dense coronary calcification of the aortic root. Post CABG changes are noted as well with multiple sternal wires which appear intact. Hepatobiliary: Slightly nodular opacity surface contour. No visible or contour deforming hepatic lesions. No frank gallbladder wall thickening. No visible gallstones. Slight prominence of the biliary tree is nonspecific given patient age. Pancreas: Unremarkable. No pancreatic ductal dilatation or surrounding inflammatory changes. Spleen: Normal in size without focal abnormality. Adrenals/Urinary Tract: Adrenal glands are unremarkable. Kidneys are normal, without renal calculi, visible renal lesion, or hydronephrosis. Bladder is unremarkable. Stomach/Bowel: Ingested contrast traverses to the level of the  transverse colon. Distal esophagus, stomach and duodenal sweep are unremarkable. No bowel wall  thickening or dilatation. No evidence of obstruction. Patient chart indicates history of appendectomy. Insert colon surgical clip noted near the rectosigmoid junction. Vascular/Lymphatic: Extensive severe atherosclerotic calcification. Luminal evaluation is not possible in the absence of contrast. Multiple a prominent though nonenlarged lymph nodes are present throughout the abdomen and groins. No pathologically enlarged nodes in the imaged chains. Reproductive: Retroverted uterus. Few calcified fibroids. No concerning adnexal lesions are visualized. Other: There is extensive severe body wall edema most pronounced over the left lateral abdominal wall and both flanks. There is a moderate volume of low-attenuation ascites in the abdomen as well. No free intraperitoneal air. Few surgical clips are seen in the anterior abdomen and low pelvis. Musculoskeletal: Body wall edema, as above. Mild muscle atrophy. Multilevel degenerative changes are present in the imaged portions of the spine. Most pronounced findings are present at L5-S1 IMPRESSION: 1. Slightly nodular opacity surface contour of the liver, suggestive of cirrhosis and compatible with history of primary biliary cholangitis. No visible or contour deforming hepatic lesions. 2. Moderate volume of low-attenuation ascites in the abdomen and pelvis, and severe body wall edema, consistent with anasarca. 3. Extensive severe atherosclerotic calcification of the aorta and its branches. Luminal evaluation is not possible in the absence of contrast. 4. Small bilateral effusions. 5. Aortic Atherosclerosis (ICD10-I70.0). 6. Prior CABG, mitral valve replacement and aortic occlusion clip placement. 7. Leiomyomatous uterus. Electronically Signed   By: Lovena Le M.D.   On: 02/05/2019 23:19   US Renal  Result Date: 02/05/2019 CLINICAL DATA:  Acute kidney injury EXAM: RENAL / URINARY TRACT ULTRASOUND COMPLETE COMPARISON:  CT 08/09/2017 [facility] FINDINGS:  Right Kidney: Renal measurements: 10.8 x 4.5 x 5.5 cm = volume: 141 mL . Echogenicity within normal limits. No mass or hydronephrosis visualized. Right renal scarring. Left Kidney: Renal measurements: 10.9 x 5.9 x 5.6 cm = volume: 190 mL. Echogenicity within normal limits. No mass or hydronephrosis visualized. Bladder: No focal abnormality.  Moderate amount of pelvic free fluid. IMPRESSION: 1. Normal renal ultrasound. 2. Moderate amount of pelvic free fluid. Electronically Signed   By: Kathreen Devoid   On: 02/05/2019 12:29    Labs: BMET Recent Labs  Lab 01/31/19 1829 02/01/19 0248 02/02/19 0342 02/03/19 0323 02/04/19 0641 02/05/19 0819 02/06/19 0314  NA 134* 138 136 133* 133* 134* 130*  K 4.0 3.6 3.9 4.3 4.4 5.0 5.0  CL 105 107 105 100 100 97* 97*  CO2 17* 20* 20* 21* 21* 22 21*  GLUCOSE 131* 120* 106* 114* 108* 93 85  BUN 60* 59* 62* 65* 79* 83* 85*  CREATININE 1.56* 1.48* 1.52* 1.66* 2.32* 2.71* 2.99*  CALCIUM 8.6* 8.4* 8.5* 8.4* 8.4* 8.4* 8.4*  PHOS  --   --   --   --   --   --  3.0   CBC Recent Labs  Lab 02/04/19 0641 02/04/19 1631 02/05/19 0819 02/05/19 1630 02/06/19 0314  WBC 15.3*  --  17.6* 16.3* 17.0*  NEUTROABS  --   --   --  15.2*  --   HGB 9.8* 9.8* 9.8* 10.9* 10.3*  HCT 28.7* 28.9* 29.5* 32.2* 29.4*  MCV 84.9  --  86.3 83.9 83.3  PLT 127*  --  139* 129* 136*    Medications:    . aspirin EC  81 mg Oral Daily  . atorvastatin  40 mg Oral q1800  . feeding supplement (ENSURE ENLIVE)  237 mL Oral BID BM  . metoprolol succinate  12.5 mg Oral Daily  . ursodiol  300 mg Oral BID  . Warfarin - Pharmacist Dosing Inpatient   Does not apply [id number]   Elmarie Shiley, MD 02/06/2019, 11:13 AM

## 2019-02-07 DIAGNOSIS — D62 Acute posthemorrhagic anemia: Secondary | ICD-10-CM

## 2019-02-07 DIAGNOSIS — K921 Melena: Secondary | ICD-10-CM

## 2019-02-07 DIAGNOSIS — R188 Other ascites: Secondary | ICD-10-CM

## 2019-02-07 DIAGNOSIS — R791 Abnormal coagulation profile: Secondary | ICD-10-CM

## 2019-02-07 DIAGNOSIS — K743 Primary biliary cirrhosis: Secondary | ICD-10-CM

## 2019-02-07 LAB — HEMOGLOBIN AND HEMATOCRIT, BLOOD
HCT: 28.7 % — ABNORMAL LOW (ref 36.0–46.0)
HCT: 27.8 % — ABNORMAL LOW (ref 36.0–46.0)
HCT: 26.5 % — ABNORMAL LOW (ref 36.0–46.0)
Hemoglobin: 9.7 g/dL — ABNORMAL LOW (ref 12.0–15.0)
Hemoglobin: 9.3 g/dL — ABNORMAL LOW (ref 12.0–15.0)
Hemoglobin: 9.3 g/dL — ABNORMAL LOW (ref 12.0–15.0)

## 2019-02-07 LAB — CBC
HCT: 28.6 % — ABNORMAL LOW (ref 36.0–46.0)
Hemoglobin: 10.1 g/dL — ABNORMAL LOW (ref 12.0–15.0)
MCH: 29.3 pg (ref 26.0–34.0)
MCHC: 35.3 g/dL (ref 30.0–36.0)
MCV: 82.9 fL (ref 80.0–100.0)
Platelets: 166 10*3/uL (ref 150–400)
RBC: 3.45 MIL/uL — ABNORMAL LOW (ref 3.87–5.11)
RDW: 17.5 % — ABNORMAL HIGH (ref 11.5–15.5)
WBC: 13.7 10*3/uL — ABNORMAL HIGH (ref 4.0–10.5)
nRBC: 0.2 % (ref 0.0–0.2)

## 2019-02-07 LAB — MAGNESIUM: Magnesium: 2.4 mg/dL (ref 1.7–2.4)

## 2019-02-07 LAB — PROTIME-INR
INR: 5.6 (ref 0.8–1.2)
INR: 3.7 — ABNORMAL HIGH (ref 0.8–1.2)
Prothrombin Time: 49.6 seconds — ABNORMAL HIGH (ref 11.4–15.2)
Prothrombin Time: 35.7 seconds — ABNORMAL HIGH (ref 11.4–15.2)

## 2019-02-07 LAB — RENAL FUNCTION PANEL
Albumin: 1.9 g/dL — ABNORMAL LOW (ref 3.5–5.0)
Anion gap: 12 (ref 5–15)
BUN: 92 mg/dL — ABNORMAL HIGH (ref 8–23)
CO2: 20 mmol/L — ABNORMAL LOW (ref 22–32)
Calcium: 8.4 mg/dL — ABNORMAL LOW (ref 8.9–10.3)
Chloride: 98 mmol/L (ref 98–111)
Creatinine, Ser: 3.21 mg/dL — ABNORMAL HIGH (ref 0.44–1.00)
GFR calc Af Amer: 16 mL/min — ABNORMAL LOW (ref >60)
GFR calc non Af Amer: 14 mL/min — ABNORMAL LOW (ref >60)
Glucose, Bld: 66 mg/dL — ABNORMAL LOW (ref 70–99)
Phosphorus: 3.8 mg/dL (ref 2.5–4.6)
Potassium: 5.3 mmol/L — ABNORMAL HIGH (ref 3.5–5.1)
Sodium: 130 mmol/L — ABNORMAL LOW (ref 135–145)

## 2019-02-07 MED ORDER — VITAMIN K1 10 MG/ML IJ SOLN
1.0000 mg | Freq: Once | INTRAVENOUS | Status: AC
  Administered 2019-02-07: 1 mg via INTRAVENOUS
  Filled 2019-02-07: qty 0.1

## 2019-02-07 MED ORDER — SODIUM ZIRCONIUM CYCLOSILICATE 10 G PO PACK
10.0000 g | PACK | Freq: Once | ORAL | Status: AC
  Administered 2019-02-07: 10 g via ORAL
  Filled 2019-02-07: qty 1

## 2019-02-07 MED ORDER — ALBUMIN HUMAN 25 % IV SOLN
25.0000 g | Freq: Once | INTRAVENOUS | Status: AC
  Administered 2019-02-07: 25 g via INTRAVENOUS
  Filled 2019-02-07: qty 50

## 2019-02-07 NOTE — Progress Notes (Signed)
Physical Therapy Treatment Patient Details Name: Marie Malone MRN: [id number] DOB: July 12, 1950 Today's Date: 02/07/2019    History of Present Illness Marie Malone is a 68 y.o. 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   Work up includes acute respiratory failure with hypoxia, A/C CHF, and L LE pain due to cellulitis vs SIRS.    PT Comments    Pt agreeable to therapy but has c/o of 8/10 abdominal pain. Pt limited by pain in presence of decreased strength and L LE ROM. Pt requires modA for moving to EoB where she sat for 10 minutes and was able to perform limited R LE ROM exercises. Unable to tolerate L LE AAROM. Pt requires maxA to return to bed.  Pt with increased abdominal pressure. RN notified. D/c plans remain appropriate. PT will continue to follow acutely.    Follow Up Recommendations  SNF     Equipment Recommendations  None recommended by PT       Precautions / Restrictions Precautions Precautions: Fall Precaution Comments: skin breakdown LLE Restrictions Weight Bearing Restrictions: No    Mobility  Bed Mobility Overal bed mobility: Needs Assistance Bed Mobility: Supine to Sit;Sit to Supine     Supine to sit: Mod assist Sit to supine: Max assist   General bed mobility comments: pt able to move R LE requires modA for movement of L LE to floor and for bringing trunk to upright maxA for management of LE back into bed and squaring pt to bed,   Transfers                 General transfer comment: declined  Ambulation/Gait             General Gait Details: declined         Balance Overall balance assessment: Needs assistance Sitting-balance support: Feet supported;No upper extremity supported;Single extremity supported;Bilateral upper extremity supported Sitting balance-Leahy Scale:  Fair Sitting balance - Comments: able to sit with no UE support for 2 minutes then requires B UE support to maintain balance                                    Cognition Arousal/Alertness: Awake/alert;Lethargic Behavior During Therapy: Flat affect Overall Cognitive Status: Within Functional Limits for tasks assessed                                        Exercises General Exercises - Lower Extremity Ankle Circles/Pumps: Both;5 reps;AROM;Seated Long Arc Quad: AROM;Right;5 reps;Seated Hip Flexion/Marching: AROM;10 reps;Right Other Exercises Other Exercises: sitting EoB for 10 minutes with min guard assist    General Comments General comments (skin integrity, edema, etc.): L calf wrapped in clean dry gauze, L LE continues to exhibit increased edema, VSS on RA, daughter present throughout session      Pertinent Vitals/Pain Pain Assessment: No/denies pain Faces Pain Scale: Hurts whole lot Pain Location: abdominal >L LE Pain Descriptors / Indicators: Grimacing Pain Intervention(s): Limited activity within patient's tolerance;Monitored during session;Repositioned           PT Goals (current goals can now be found in the care plan section) Acute Rehab PT Goals Patient Stated Goal: manage pain PT Goal  Formulation: With patient Time For Goal Achievement: 02/21/19 Potential to Achieve Goals: Fair Progress towards PT goals: Progressing toward goals    Frequency    Min 3X/week      PT Plan Current plan remains appropriate       AM-PAC PT "6 Clicks" Mobility   Outcome Measure  Help needed turning from your back to your side while in a flat bed without using bedrails?: A Lot Help needed moving from lying on your back to sitting on the side of a flat bed without using bedrails?: A Lot Help needed moving to and from a bed to a chair (including a wheelchair)?: Total Help needed standing up from a chair using your arms (e.g., wheelchair or  bedside chair)?: Total Help needed to walk in hospital room?: Total Help needed climbing 3-5 steps with a railing? : Total 6 Click Score: 8    End of Session   Activity Tolerance: Patient limited by pain Patient left: in bed;with call bell/phone within reach Nurse Communication: Mobility status PT Visit Diagnosis: Other abnormalities of gait and mobility (R26.89);Pain Pain - Right/Left: Left Pain - part of body: Leg     Time: 1331-1355 PT Time Calculation (min) (ACUTE ONLY): 24 min  Charges:  $Therapeutic Activity: 23-37 mins                     Demontae Antunes B. Migdalia Dk PT, DPT Acute Rehabilitation Services Pager [phone] Office [phone]    [place] 02/07/2019, 2:04 PM

## 2019-02-07 NOTE — Plan of Care (Signed)
  Problem: Education: Goal: Knowledge of General Education information will improve Description: Including pain rating scale, medication(s)/side effects and non-pharmacologic comfort measures Outcome: Progressing   Problem: Coping: Goal: Level of anxiety will decrease Outcome: Progressing   Problem: Safety: Goal: Ability to remain free from injury will improve Outcome: Progressing   

## 2019-02-07 NOTE — Progress Notes (Signed)
Patient ID: Marie Malone, female   DOB: [birth year], 68 y.o.   MRN: [id number] [facility] Progress Note   Assessment/ Plan:   1.  Acute kidney injury:  Anuric with a smaller rise of creatinine noted today.  Differential diagnosis include ATN and acute interstitial nephritis (AIN) with available labs favoring hemodynamically mediated AKI/ATN.  Renal ultrasound negative for any obstruction and abdominal/pelvic imaging showing ascites/abdominal wall edema.  Her urine sodium of <10 indicates that she has decreased effective arterial blood volume (I suspect this has largely to do with pulmonary hypertension/right heart failure).  Continue to hold diuretics at this time and will give 25gm albumin. High risk for needing HD if develops symptomatic pulmonary edema. 2.  Hypoxic respiratory failure: Secondary to acute exacerbation of diastolic heart failure in this patient with pulmonary hypertension.  Holding diuretics at this time to allow for "fluid equilibration". 3.  Left leg cellulitis:  No evidence of intra-abdominal/pelvic mass that could be interfering with left leg venous drainage.  Continue treatment for cellulitis per primary service. 4.  Anemia: Possibly secondary to chronic disease and recent loss.  Supra therapeutic INR with BRBPR--seen earlier by GI.  5.  History of mechanical mitral valve: On anticoagulation with warfarin 6.  History of primary biliary cirrhosis 7. Hyperkalemia: begin renal diet, give a dose of Lokelma today.  Subjective:   Feeling better today with a little more energy and appetite. Rectal bleeding this AM   Objective:   BP 114/64   Pulse 88   Temp 97.7 F (36.5 C)   Resp 16   Ht 5\' 2"  (1.575 m)   Wt 77.5 kg   SpO2 100%   BMI 31.25 kg/m   Intake/Output Summary (Last 24 hours) at 02/07/2019 1015 Last data filed at 02/07/2019 0800 Gross per 24 hour  Intake 50 ml  Output -  Net 50 ml   Weight change: 0 kg  Physical Exam: Gen: Appears to be  comfortable resting in bed, left leg slightly elevated on pillow.  Sister feeding her CVS: Pulse regular rhythm, normal rate, normal S1 and S2 Resp: Decreased breath sounds over both bases, no distinct rhonchi Abd: Soft, slightly distended, bowel sounds normal Ext: 2+ left leg edema, trace-1+ right leg edema  Imaging: Ct Abdomen Pelvis Wo Contrast  Result Date: 02/05/2019 CLINICAL DATA:  Lower extremity edema, history of primary biliary cholangitis EXAM: CT ABDOMEN AND PELVIS WITHOUT CONTRAST TECHNIQUE: Multidetector CT imaging of the abdomen and pelvis was performed following the standard protocol without IV contrast. COMPARISON:  CT 08/09/2017 FINDINGS: Lower chest: Small bilateral effusions. Central vascular crowding is noted. Punctate granuloma noted right lung base. There is a prior mitral valve replacement and atrial occlusion device noted over the cardiac silhouette. There is extensive severe coronary artery calcification as well as dense coronary calcification of the aortic root. Post CABG changes are noted as well with multiple sternal wires which appear intact. Hepatobiliary: Slightly nodular opacity surface contour. No visible or contour deforming hepatic lesions. No frank gallbladder wall thickening. No visible gallstones. Slight prominence of the biliary tree is nonspecific given patient age. Pancreas: Unremarkable. No pancreatic ductal dilatation or surrounding inflammatory changes. Spleen: Normal in size without focal abnormality. Adrenals/Urinary Tract: Adrenal glands are unremarkable. Kidneys are normal, without renal calculi, visible renal lesion, or hydronephrosis. Bladder is unremarkable. Stomach/Bowel: Ingested contrast traverses to the level of the transverse colon. Distal esophagus, stomach and duodenal sweep are unremarkable. No bowel wall thickening or dilatation. No evidence of obstruction.  Patient chart indicates history of appendectomy. Insert colon surgical clip noted near the  rectosigmoid junction. Vascular/Lymphatic: Extensive severe atherosclerotic calcification. Luminal evaluation is not possible in the absence of contrast. Multiple a prominent though nonenlarged lymph nodes are present throughout the abdomen and groins. No pathologically enlarged nodes in the imaged chains. Reproductive: Retroverted uterus. Few calcified fibroids. No concerning adnexal lesions are visualized. Other: There is extensive severe body wall edema most pronounced over the left lateral abdominal wall and both flanks. There is a moderate volume of low-attenuation ascites in the abdomen as well. No free intraperitoneal air. Few surgical clips are seen in the anterior abdomen and low pelvis. Musculoskeletal: Body wall edema, as above. Mild muscle atrophy. Multilevel degenerative changes are present in the imaged portions of the spine. Most pronounced findings are present at L5-S1 IMPRESSION: 1. Slightly nodular opacity surface contour of the liver, suggestive of cirrhosis and compatible with history of primary biliary cholangitis. No visible or contour deforming hepatic lesions. 2. Moderate volume of low-attenuation ascites in the abdomen and pelvis, and severe body wall edema, consistent with anasarca. 3. Extensive severe atherosclerotic calcification of the aorta and its branches. Luminal evaluation is not possible in the absence of contrast. 4. Small bilateral effusions. 5. Aortic Atherosclerosis (ICD10-I70.0). 6. Prior CABG, mitral valve replacement and aortic occlusion clip placement. 7. Leiomyomatous uterus. Electronically Signed   By: Lovena Le M.D.   On: 02/05/2019 23:19   US Renal  Result Date: 02/05/2019 CLINICAL DATA:  Acute kidney injury EXAM: RENAL / URINARY TRACT ULTRASOUND COMPLETE COMPARISON:  CT 08/09/2017 [facility] FINDINGS: Right Kidney: Renal measurements: 10.8 x 4.5 x 5.5 cm = volume: 141 mL . Echogenicity within normal limits. No mass or hydronephrosis visualized.  Right renal scarring. Left Kidney: Renal measurements: 10.9 x 5.9 x 5.6 cm = volume: 190 mL. Echogenicity within normal limits. No mass or hydronephrosis visualized. Bladder: No focal abnormality.  Moderate amount of pelvic free fluid. IMPRESSION: 1. Normal renal ultrasound. 2. Moderate amount of pelvic free fluid. Electronically Signed   By: Kathreen Devoid   On: 02/05/2019 12:29    Labs: BMET Recent Labs  Lab 02/01/19 0248 02/02/19 8891 02/03/19 0323 02/04/19 6945 02/05/19 0819 02/06/19 0314 02/07/19 0352  NA 138 136 133* 133* 134* 130* 130*  K 3.6 3.9 4.3 4.4 5.0 5.0 5.3*  CL 107 105 100 100 97* 97* 98  CO2 20* 20* 21* 21* 22 21* 20*  GLUCOSE 120* 106* 114* 108* 93 85 66*  BUN 59* 62* 65* 79* 83* 85* 92*  CREATININE 1.48* 1.52* 1.66* 2.32* 2.71* 2.99* 3.21*  CALCIUM 8.4* 8.5* 8.4* 8.4* 8.4* 8.4* 8.4*  PHOS  --   --   --   --   --  3.0 3.8   CBC Recent Labs  Lab 02/05/19 0819 02/05/19 1630 02/06/19 0314 02/06/19 1550 02/06/19 2124 02/07/19 0352 02/07/19 0919  WBC 17.6* 16.3* 17.0*  --   --  13.7*  --   NEUTROABS  --  15.2*  --   --   --   --   --   HGB 9.8* 10.9* 10.3* 9.4* 10.0* 10.1* 9.7*  HCT 29.5* 32.2* 29.4* 26.6* 28.3* 28.6* 28.7*  MCV 86.3 83.9 83.3  --   --  82.9  --   PLT 139* 129* 136*  --   --  166  --     Medications:    . atorvastatin  40 mg Oral q1800  . feeding supplement (ENSURE ENLIVE)  237 mL Oral BID BM  . metoprolol succinate  12.5 mg Oral Daily  . pantoprazole (PROTONIX) IV  40 mg Intravenous Q12H  . ursodiol  300 mg Oral BID  . Warfarin - Pharmacist Dosing Inpatient   Does not apply [id number]   Elmarie Shiley, MD 02/07/2019, 10:15 AM

## 2019-02-07 NOTE — Progress Notes (Addendum)
Progress Note  Patient Name: Marie Malone Date of Encounter: 02/07/2019  Primary Cardiologist: [name], MD  Subjective   Patient states she is feeling slightly better than yesterday.  Per RN staff, had some bleeding with stool this morning.  Inpatient Medications    Scheduled Meds:  atorvastatin  40 mg Oral q1800   feeding supplement (ENSURE ENLIVE)  237 mL Oral BID BM   metoprolol succinate  12.5 mg Oral Daily   pantoprazole (PROTONIX) IV  40 mg Intravenous Q12H   ursodiol  300 mg Oral BID   Warfarin - Pharmacist Dosing Inpatient   Does not apply q1800   Continuous Infusions:   ceFAZolin (ANCEF) IV 2 g (02/07/19 0524)   phytonadione (VITAMIN K) IV     PRN Meds: diphenhydrAMINE, docusate sodium, morphine injection, ondansetron **OR** ondansetron (ZOFRAN) IV, oxyCODONE **OR** oxyCODONE   Vital Signs    Vitals:   02/06/19 1923 02/06/19 2022 02/07/19 0448 02/07/19 0506  BP:  (!) 122/59  (!) 110/52  Pulse:  86 87 90  Resp: 13 16 15 13   Temp:  97.8 F (36.6 C)  97.7 F (36.5 C)  TempSrc:      SpO2:  97% 99% 100%  Weight:    77.5 kg  Height:        Intake/Output Summary (Last 24 hours) at 02/07/2019 0722 Last data filed at 02/06/2019 0900 Gross per 24 hour  Intake 50 ml  Output --  Net 50 ml   Filed Weights   02/05/19 0355 02/06/19 0635 02/07/19 0506  Weight: 74.1 kg 77.5 kg 77.5 kg    Physical Exam   General: Ill-appearing, NAD Neck: Negative for carotid bruits. No JVD Lungs:Clear to ausculation bilaterally. No wheezes. Breathing is unlabored. Cardiovascular: Irregular with S1 S2.  + Mechanical murmur Abdomen: Firm, mild tenderness, non-distended. No obvious abdominal masses. Extremities: 1+BLE edema. No clubbing or cyanosis. DP pulses 1+ bilaterally Neuro: Alert and oriented. No focal deficits. No facial asymmetry. MAE spontaneously. Psych: Responds to questions appropriately with normal affect.    Labs    Chemistry Recent Labs   Lab 02/03/19 0323 02/04/19 0641 02/05/19 0819 02/06/19 0314 02/07/19 0352  NA 133* 133* 134* 130* 130*  K 4.3 4.4 5.0 5.0 5.3*  CL 100 100 97* 97* 98  CO2 21* 21* 22 21* 20*  GLUCOSE 114* 108* 93 85 66*  BUN 65* 79* 83* 85* 92*  CREATININE 1.66* 2.32* 2.71* 2.99* 3.21*  CALCIUM 8.4* 8.4* 8.4* 8.4* 8.4*  PROT 8.2* 8.3* 8.7*  --   --   ALBUMIN 2.2* 2.1* 2.0* 2.0* 1.9*  AST 40 38 44*  --   --   ALT 10 6 <5  --   --   ALKPHOS 85 75 76  --   --   BILITOT 1.4* 1.4* 1.7*  --   --   GFRNONAA 32* 21* 17* 15* 14*  GFRAA 37* 24* 20* 18* 16*  ANIONGAP 12 12 15 12 12      Hematology Recent Labs  Lab 02/05/19 1630 02/06/19 0314 02/06/19 1550 02/06/19 2124 02/07/19 0352  WBC 16.3* 17.0*  --   --  13.7*  RBC 3.84* 3.53*  --   --  3.45*  HGB 10.9* 10.3* 9.4* 10.0* 10.1*  HCT 32.2* 29.4* 26.6* 28.3* 28.6*  MCV 83.9 83.3  --   --  82.9  MCH 28.4 29.2  --   --  29.3  MCHC 33.9 35.0  --   --  35.3  RDW 17.1* 17.3*  --   --  17.5*  PLT 129* 136*  --   --  166    Cardiac EnzymesNo results for input(s): TROPONINI in the last 168 hours. No results for input(s): TROPIPOC in the last 168 hours.   BNPNo results for input(s): BNP, PROBNP in the last 168 hours.   DDimer No results for input(s): DDIMER in the last 168 hours.   Radiology    Ct Abdomen Pelvis Wo Contrast  Result Date: 02/05/2019 CLINICAL DATA:  Lower extremity edema, history of primary biliary cholangitis EXAM: CT ABDOMEN AND PELVIS WITHOUT CONTRAST TECHNIQUE: Multidetector CT imaging of the abdomen and pelvis was performed following the standard protocol without IV contrast. COMPARISON:  CT 08/09/2017 FINDINGS: Lower chest: Small bilateral effusions. Central vascular crowding is noted. Punctate granuloma noted right lung base. There is a prior mitral valve replacement and atrial occlusion device noted over the cardiac silhouette. There is extensive severe coronary artery calcification as well as dense coronary calcification of  the aortic root. Post CABG changes are noted as well with multiple sternal wires which appear intact. Hepatobiliary: Slightly nodular opacity surface contour. No visible or contour deforming hepatic lesions. No frank gallbladder wall thickening. No visible gallstones. Slight prominence of the biliary tree is nonspecific given patient age. Pancreas: Unremarkable. No pancreatic ductal dilatation or surrounding inflammatory changes. Spleen: Normal in size without focal abnormality. Adrenals/Urinary Tract: Adrenal glands are unremarkable. Kidneys are normal, without renal calculi, visible renal lesion, or hydronephrosis. Bladder is unremarkable. Stomach/Bowel: Ingested contrast traverses to the level of the transverse colon. Distal esophagus, stomach and duodenal sweep are unremarkable. No bowel wall thickening or dilatation. No evidence of obstruction. Patient chart indicates history of appendectomy. Insert colon surgical clip noted near the rectosigmoid junction. Vascular/Lymphatic: Extensive severe atherosclerotic calcification. Luminal evaluation is not possible in the absence of contrast. Multiple a prominent though nonenlarged lymph nodes are present throughout the abdomen and groins. No pathologically enlarged nodes in the imaged chains. Reproductive: Retroverted uterus. Few calcified fibroids. No concerning adnexal lesions are visualized. Other: There is extensive severe body wall edema most pronounced over the left lateral abdominal wall and both flanks. There is a moderate volume of low-attenuation ascites in the abdomen as well. No free intraperitoneal air. Few surgical clips are seen in the anterior abdomen and low pelvis. Musculoskeletal: Body wall edema, as above. Mild muscle atrophy. Multilevel degenerative changes are present in the imaged portions of the spine. Most pronounced findings are present at L5-S1 IMPRESSION: 1. Slightly nodular opacity surface contour of the liver, suggestive of cirrhosis and  compatible with history of primary biliary cholangitis. No visible or contour deforming hepatic lesions. 2. Moderate volume of low-attenuation ascites in the abdomen and pelvis, and severe body wall edema, consistent with anasarca. 3. Extensive severe atherosclerotic calcification of the aorta and its branches. Luminal evaluation is not possible in the absence of contrast. 4. Small bilateral effusions. 5. Aortic Atherosclerosis (ICD10-I70.0). 6. Prior CABG, mitral valve replacement and aortic occlusion clip placement. 7. Leiomyomatous uterus. Electronically Signed   By: Lovena Le M.D.   On: 02/05/2019 23:19   US Renal  Result Date: 02/05/2019 CLINICAL DATA:  Acute kidney injury EXAM: RENAL / URINARY TRACT ULTRASOUND COMPLETE COMPARISON:  CT 08/09/2017 [facility] FINDINGS: Right Kidney: Renal measurements: 10.8 x 4.5 x 5.5 cm = volume: 141 mL . Echogenicity within normal limits. No mass or hydronephrosis visualized. Right renal scarring. Left Kidney: Renal measurements: 10.9 x 5.9 x  5.6 cm = volume: 190 mL. Echogenicity within normal limits. No mass or hydronephrosis visualized. Bladder: No focal abnormality.  Moderate amount of pelvic free fluid. IMPRESSION: 1. Normal renal ultrasound. 2. Moderate amount of pelvic free fluid. Electronically Signed   By: Kathreen Devoid   On: 02/05/2019 12:29   Telemetry    02/07/2019 atrial flutter HR 90s- Personally Reviewed  ECG    No new tracing as of 02/07/2019- Personally Reviewed  Cardiac Studies   Echo 01/24/19 1. Left ventricular ejection fraction, by visual estimation, is 45 to 50%. The left ventricle has mildly decreased function. Normal left ventricular size. Left ventricular septal wall thickness was normal. Normal left ventricular posterior wall  thickness. There is no left ventricular hypertrophy. 2. Indeterminate diastolic function due to mechanical mitral valve pattern of LV diastolic filling. 3. Right ventricular pressure  overload and right ventricular volume overload. 4. Global right ventricle has moderately reduced systolic function.The right ventricular size is moderately enlarged. No increase in right ventricular wall thickness. 5. Left atrial size was moderately dilated. 6. Right atrial size was severely dilated. 7. The mitral valve has been repaired/replaced. No evidence of mitral valve regurgitation. No evidence of mitral stenosis. 8. The tricuspid valve is normal in structure. Tricuspid valve regurgitation mild-moderate. 9. The aortic valve The aortic valve is normal in structure. Aortic valve regurgitation was not visualized by color flow Doppler. Structurally normal aortic valve, with no evidence of sclerosis or stenosis. 10. The pulmonic valve was normal in structure. Pulmonic valve regurgitation is mild by color flow Doppler. 11. Moderately elevated pulmonary artery systolic pressure. 12. The inferior vena cava is dilated in size with <50% respiratory variability, suggesting right atrial pressure of 15 mmHg.  Right and left cardiac catheterization 03/25/2016:   left ventricular systolic function is normal.  The left ventricular ejection fraction is 55-65% by visual estimate.  There is no aortic valve stenosis.  There is moderate mitral valve stenosis. Mean gradient 10 mm Hg. MV area 1.3 cm2  Mid Cx to Dist Cx lesion, 10 %stenosed.  Ost 1st Mrg to 1st Mrg lesion, 20 %stenosed.  Prox RCA lesion, 100 %stenosed. Left to right and right to right collaterals are present.  Hemodynamic findings consistent with moderate pulmonary hypertension and mitral valve stenosis.  LV end diastolic pressure is normal.  Patient Profile     68 y.o. female with a history of rheumatic fever/mitral valve stenosis s/p valvuloplasty at [facility] in 2006, CAD s/p CABG, mechanical MVR, paroxysmal afib with MAZE procedure in 2018 on coumadin, PAD s/p endarterectomy in 03/2016 who was admitted on 9/16 for acute heart  failure complicated by aflutter.   Assessment & Plan    1. Acute on Chronic combined systolic and diastolic heart failure: -Patient presented 9/15 with SOB found to have an elevated BNP at1600 -CXR with pulmonary edema and small right pleural effusion -Echo showed EF 45-50% with right ventricular dysfunction -Was previously on IV lasix but has been stopped for worsening renal function. -Creatinine 2.23 > 2.71 > 2.99>>3.21 today, ARB discontinued last week.  -US renal negative for obstruction.  -CT abdomen and pelvis ordered>>nephrology following -Weight and I/Os not reflective of diuresis  -Continue BB -Holding diuretics given worsening renal function  2. 2:1 aflutter/Chronic afibwith h/o of maze procedure in 2018: -Patient was in afib on admission and was thought to have converted to NSR vs 2:1 aflutter vs 2:1 atach -After discussing with EP rhythm appears patient is in 2:1 [place] = 4 ( age,  female, CHF, vascular dz) -Continue metoprolol for rate control -INR 5.6 today>> was given 1 dose of vitamin K orally 02/06/2019.  Due for IV vitamin K today -Had evidence of blood in stool earlier today, hemoglobin 10.1 -Plan for TEE/DCCV prior to DC   3. LLE cellulitiswith swelling: -WBC 13.7 today, improved  -CT negative for hematoma. Per Vascular more consistent with edema -Abx per ID -Once infection is better plan for TEE/DCCV  4. AKI: -Renal function continues to worsen>>3.21 today, up from 2.99 02/06/2019 -US renal negative for obstruction -CT abd/pelvis revealed Moderate volume of low-attenuation ascites in the abdomen and pelvis, and severe body wall edema, consistent with anasarca -Nephrology following >> AKI appears oliguric  5. Chest pain/H/O of CAD s/p CABG: -HsT 791> 718 and no EKG changes -Denies anginal symptoms  -No further ischemic work-up at this time -Continue ASA, BB, statin  7. 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   Patient seen and examined.  Agree with above documentation.  On exam, she is somnolent but arousable, lungs are clear to auscultation bilaterally, 1+ bilateral lower extremity edema, no JVD, regular rate and rhythm, no murmurs.  She denies any dyspnea or chest pain.  Yesterday had BRBPR, she was given vitamin K and her INR is slightly improved to 5.6 today.  Continue to hold Coumadin.  Telemetry was personally reviewed, she remains in 2:1 aflutter.  Planning TEE/DCCV prior to discharge.  Not currently a candidate for TEE given her INR.  Labs notable for worsening renal function, creatinine up to 3.2 today.  Holding diuresis, nephrology following.  Donato Heinz, MD

## 2019-02-07 NOTE — Consult Note (Addendum)
[facility] Gastroenterology Consult Note   History Marie Malone MRN # [id number]  Date of Admission: 01/17/2019 Date of Consultation: 02/07/2019 Referring physician: Dr. Jonnie Finner, DO Primary Care Provider: Debbrah Alar, NP Primary Gastroenterologist: Dr. Scarlette Shorts   Reason for Consultation/Chief Complaint: Hematochezia  Subjective  HPI:  This is a 68 year old woman known to Dr. Henrene Pastor from office evaluation of [place], who was admitted over a week ago with progressive dyspnea and found to be in pulmonary edema.  She also had severe left leg swelling and concerns initially for either DVT or apartment syndrome or hematoma, all of which were ruled out.  She was then put on multiple antibiotics for cellulitis after infectious disease consultation.  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 difficile, additional GI pathogen panel negative and negative fecal lactoferrin.  We were called because yesterday she had an episode of passing dark blood per rectum.  She is on Coumadin for a mechanical mitral valve, and INR has been persistently elevated, felt likely to be from antibiotic use and poor nutrition.  INR was over 5 yesterday and she received 1 unit of vitamin K IV.  INR remained over 5 today, she is just finished receiving another 1 unit of vitamin K.  Patient is somnolent, can provide little useful history or review of systems.  She denies abdominal pain today. Patient's nurse who was present for the entire exam indicates there was passage of some maroon blood earlier today.  ROS:  Unable to obtain due to patient's somnolence.  Past Medical History Past Medical History:  Diagnosis Date  . Atrial fibrillation ([place])   . Blood transfusion without reported diagnosis 05/2016  . Chronic diastolic CHF (congestive heart failure) ([place])   . Clotting disorder ([place]) 2014   placed  on Warfarin  . Complication of anesthesia    difficult to wake up from anesthesia  . Coronary artery disease involving native coronary artery without angina pectoris 03/26/2016   100% chronic occlusion of RCA  . Dyspnea   . Heart murmur   . History of rheumatic fever    as a child  . Hyperlipidemia   . Hypertension   . Mitral stenosis   . OSA (obstructive sleep apnea)    mild per sleep study 2008  . Paroxysmal atrial fibrillation ([facility])   . Pneumonia    double pneumonia once  . Pulmonary hypertension ([place])   . Rheumatic mitral regurgitation   . S/P balloon mitral valvuloplasty 2008   . 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 S/P mitral valve replacement with mechanical valve 05/28/2016   29 mm Sorin Carbomedics Optiform bileaflet mechanical prosthetic valve  . Stroke [facility])    x2    Past Surgical History Past Surgical History:  Procedure Laterality Date  . APPENDECTOMY  1989   appendix ruptured,had peritonitis  . BALLOON VALVULOPLASTY  2008   [facility]  . CARDIAC CATHETERIZATION N/A 03/25/2016   Procedure: Right/Left Heart Cath and Coronary Angiography;  Surgeon: Jettie Booze, MD;  Location: [place] CV LAB;  Service: Cardiovascular;  Laterality: N/A;  . COLONOSCOPY W/ POLYPECTOMY    . CORONARY ARTERY BYPASS GRAFT N/A 05/28/2016   Procedure: CORONARY ARTERY BYPASS GRAFTING (CABG) x 1 WITH ENDOSCOPIC HARVESTING OF RIGHT SAPHENOUS VEIN, EVH- PDA;  Surgeon: Rexene Alberts, MD;  Location: [place];  Service:  Open Heart Surgery;  Laterality: N/A;  . ENDARTERECTOMY Right 03/10/2016   Procedure: ENDARTERECTOMY CAROTID RIGHT;  Surgeon: Serafina Mitchell, MD;  Location: [place];  Service: Vascular;  Laterality: Right;  . EP IMPLANTABLE DEVICE N/A 03/11/2016   Procedure: Loop Recorder Removal;  Surgeon: Deboraha Sprang, MD;  Location: [place] CV LAB;  Service: Cardiovascular;  Laterality: N/A;  . LOOP RECORDER IMPLANT  04-10-2013   MDT LinQ implanted by Dr Rayann Heman  for cryptogenic stroke  . LOOP RECORDER IMPLANT N/A 04/10/2013   Procedure: LOOP RECORDER IMPLANT;  Surgeon: Coralyn Mark, MD;  Location: [place] CATH LAB;  Service: Cardiovascular;  Laterality: N/A;  . MAZE N/A 05/28/2016   Procedure: MAZE PROCEDURE AND APPLICATION OF  ATRICLIP LAA PROCLIP II 21 MM;  Surgeon: Rexene Alberts, MD;  Location: [place];  Service: Open Heart Surgery;  Laterality: N/A;  . MITRAL VALVE REPLACEMENT N/A 05/28/2016   Procedure: MITRAL VALVE (MV) REPLACEMENT USING 29 MM CARBOMEDICS OPTIFORM MECHANICAL BILEAFLET PROSTHETIC HEART VALVE;  Surgeon: Rexene Alberts, MD;  Location: [place];  Service: Open Heart Surgery;  Laterality: N/A;  . MULTIPLE EXTRACTIONS WITH ALVEOLOPLASTY N/A 03/30/2016   Procedure: EXTRACTION OF TOOTH #'S 2,5,6,11,15,16,20-30 AND 32 WITH ALVEOLOPLASTY AND BILATERAL MAXILLARY LATERAL EXOSTOSES REDUCTIONS;  Surgeon: Lenn Cal, DDS;  Location: [place];  Service: Oral Surgery;  Laterality: N/A;  . PATCH ANGIOPLASTY Right 03/10/2016   Procedure: PATCH ANGIOPLASTY CAROTID RIGHT USING Rueben Bash BIOLOGIC PATCH;  Surgeon: Serafina Mitchell, MD;  Location: [place];  Service: Vascular;  Laterality: Right;  . TEE WITHOUT CARDIOVERSION N/A 04/10/2013   Procedure: TRANSESOPHAGEAL ECHOCARDIOGRAM (TEE);  Surgeon: [name], MD;  Location: [facility] ENDOSCOPY;  Service: Cardiovascular;  Laterality: N/A;  . TEE WITHOUT CARDIOVERSION N/A 03/24/2016   Procedure: TRANSESOPHAGEAL ECHOCARDIOGRAM (TEE);  Surgeon: Jerline Pain, MD;  Location: [place];  Service: Cardiovascular;  Laterality: N/A;  . TEE WITHOUT CARDIOVERSION N/A 05/28/2016   Procedure: TRANSESOPHAGEAL ECHOCARDIOGRAM (TEE);  Surgeon: Rexene Alberts, MD;  Location: [place];  Service: Open Heart Surgery;  Laterality: N/A;  . TUBAL LIGATION      Family History Family History  Problem Relation Age of Onset  . Diabetes Mother   . Stroke Mother   . Diabetes Father   . Heart disease Father        CHF  . Cancer Father         kidney  . Diabetes Sister   . Diabetes Brother   . Hypertension Daughter   . Colon polyps Neg Hx   . Colon cancer Neg Hx   . Esophageal cancer Neg Hx   . Rectal cancer Neg Hx   . Stomach cancer Neg Hx    The patient had an upper endoscopy with Dr. Henrene Pastor in August 2019, at which point no esophageal or gastric varices were seen.  It is unknown when she may have ever had a colonoscopy, as there is not one on file in the epic chart and she has no recollection of it.  Social History Social History   Socioeconomic History  . Marital status: Married    Spouse name: Not on file  . Number of children: 2  . Years of education: Associates  . Highest education level: Not on file  Occupational History  . Not on file  Social Needs  . Financial resource strain: Not on file  . Food insecurity    Worry: Not on file    Inability: Not on  file  . Transportation needs    Medical: Not on file    Non-medical: Not on file  Tobacco Use  . Smoking status: Former Smoker    Packs/day: 1.00    Years: 5.00    Pack years: 5.00    Types: Cigarettes    Start date: 07/15/1970    Quit date: 05/11/1975    Years since quitting: 43.7  . Smokeless tobacco: Never Used  . Tobacco comment: quit smoking 36 years ago  Substance and Sexual Activity  . Alcohol use: No    Alcohol/week: 0.0 standard drinks  . Drug use: No  . Sexual activity: Not on file  Lifestyle  . Physical activity    Days per week: Not on file    Minutes per session: Not on file  . Stress: Not on file  Relationships  . Social Herbalist on phone: Not on file    Gets together: Not on file    Attends religious service: Not on file    Active member of club or organization: Not on file    Attends meetings of clubs or organizations: Not on file    Relationship status: Not on file  Other Topics Concern  . Not on file  Social History Narrative   Retired Archivist   Married   She has 2 grown children-    Daughter lives in [place]   Son lives in [place]   6 grandchildren     Allergies Allergies  Allergen Reactions  . No Known Allergies     Outpatient Meds Home medications from the H+P and/or nursing med reconciliation reviewed.  Inpatient med list reviewed  _____________________________________________________________________ Objective   Exam:  Current vital signs  Patient Vitals for the past 8 hrs:  BP Temp Pulse Resp SpO2 Weight  02/07/19 0805 114/64 - 88 - - -  02/07/19 0800 114/64 - 87 16 100 % -  02/07/19 0506 (!) 110/52 97.7 F (36.5 C) 90 13 100 % 77.5 kg  02/07/19 0448 - - 87 15 99 % -    Intake/Output Summary (Last 24 hours) at 02/07/2019 0948 Last data filed at 02/07/2019 0800 Gross per 24 hour  Intake 50 ml  Output -  Net 50 ml    Physical Exam:  Acutely ill, nontoxic, somnolent.    Eyes: sclera anicteric, no redness  ENT: oral mucosa dry and not well visualized, no cervical or supraclavicular lymphadenopathy  CV: RRR with valve click, no appreciable JVD,, + anasarca  Resp: Air entry bilaterally, but poor inspiratory effort from her generalized weakness, normal RR no respiratory distress  GI: soft, no tenderness, with active bowel sounds. No guarding or palpable organomegaly noted, though limited by body habitus  Skin; warm and dry, brawny edematous changes left leg with overlying gauze wrap.  Neuro: Moves all extremities, needs help getting turned over for rectal exam.  Able to participate in full neuro exam Rectal: Maroon blood in perianal area and on glove during rectal exam ( not melena).  No palpable internal lesion. No tenderness Labs:  CBC Latest Ref Rng & Units 02/07/2019 02/06/2019 02/06/2019  WBC 4.0 - 10.5 K/uL 13.7(H) - -  Hemoglobin 12.0 - 15.0 g/dL 10.1(L) 10.0(L) 9.4(L)  Hematocrit 36.0 - 46.0 % 28.6(L) 28.3(L) 26.6(L)  Platelets 150 - 400 K/uL 166 - -    CMP Latest Ref Rng & Units 02/07/2019 02/06/2019 02/05/2019  Glucose 70  - 99 mg/dL 66(L) 85 93  BUN 8 - 23  mg/dL 92(H) 85(H) 83(H)  Creatinine 0.44 - 1.00 mg/dL 3.21(H) 2.99(H) 2.71(H)  Sodium 135 - 145 mmol/L 130(L) 130(L) 134(L)  Potassium 3.5 - 5.1 mmol/L 5.3(H) 5.0 5.0  Chloride 98 - 111 mmol/L 98 97(L) 97(L)  CO2 22 - 32 mmol/L 20(L) 21(L) 22  Calcium 8.9 - 10.3 mg/dL 8.4(L) 8.4(L) 8.4(L)  Total Protein 6.5 - 8.1 g/dL - - 8.7(H)  Total Bilirubin 0.3 - 1.2 mg/dL - - 1.7(H)  Alkaline Phos 38 - 126 U/L - - 76  AST 15 - 41 U/L - - 44(H)  ALT 0 - 44 U/L - - <5    Recent Labs  Lab 02/07/19 0352  INR 5.6*    Radiologic studies: CT abdomen and pelvis yesterday was personally reviewed::  CLINICAL DATA:  Lower extremity edema, history of primary biliary cholangitis   EXAM: CT ABDOMEN AND PELVIS WITHOUT CONTRAST   TECHNIQUE: Multidetector CT imaging of the abdomen and pelvis was performed following the standard protocol without IV contrast.   COMPARISON:  CT 08/09/2017   FINDINGS: Lower chest: Small bilateral effusions. Central vascular crowding is noted. Punctate granuloma noted right lung base. There is a prior mitral valve replacement and atrial occlusion device noted over the cardiac silhouette. There is extensive severe coronary artery calcification as well as dense coronary calcification of the aortic root. Post CABG changes are noted as well with multiple sternal wires which appear intact.   Hepatobiliary: Slightly nodular opacity surface contour. No visible or contour deforming hepatic lesions. No frank gallbladder wall thickening. No visible gallstones. Slight prominence of the biliary tree is nonspecific given patient age.   Pancreas: Unremarkable. No pancreatic ductal dilatation or surrounding inflammatory changes.   Spleen: Normal in size without focal abnormality.   Adrenals/Urinary Tract: Adrenal glands are unremarkable. Kidneys are normal, without renal calculi, visible renal lesion, or hydronephrosis. Bladder is  unremarkable.   Stomach/Bowel: Ingested contrast traverses to the level of the transverse colon. Distal esophagus, stomach and duodenal sweep are unremarkable. No bowel wall thickening or dilatation. No evidence of obstruction. Patient chart indicates history of appendectomy. Insert colon surgical clip noted near the rectosigmoid junction.   Vascular/Lymphatic: Extensive severe atherosclerotic calcification. Luminal evaluation is not possible in the absence of contrast. Multiple a prominent though nonenlarged lymph nodes are present throughout the abdomen and groins. No pathologically enlarged nodes in the imaged chains.   Reproductive: Retroverted uterus. Few calcified fibroids. No concerning adnexal lesions are visualized.   Other: There is extensive severe body wall edema most pronounced over the left lateral abdominal wall and both flanks. There is a moderate volume of low-attenuation ascites in the abdomen as well. No free intraperitoneal air. Few surgical clips are seen in the anterior abdomen and low pelvis.   Musculoskeletal: Body wall edema, as above. Mild muscle atrophy. Multilevel degenerative changes are present in the imaged portions of the spine. Most pronounced findings are present at L5-S1   IMPRESSION: 1. Slightly nodular opacity surface contour of the liver, suggestive of cirrhosis and compatible with history of primary biliary cholangitis. No visible or contour deforming hepatic lesions. 2. Moderate volume of low-attenuation ascites in the abdomen and pelvis, and severe body wall edema, consistent with anasarca. 3. Extensive severe atherosclerotic calcification of the aorta and its branches. Luminal evaluation is not possible in the absence of contrast. 4. Small bilateral effusions. 5. Aortic Atherosclerosis (ICD10-I70.0). 6. Prior CABG, mitral valve replacement and aortic occlusion clip placement. 7. Leiomyomatous uterus.     Electronically Signed  By:  Lovena Le M.D.   On: 02/05/2019 23:19   @ASSESSMENTPLANBEGIN @ Impression:  1. Hematochezia in the setting of severe acute multisystem illness, not brisk GI bleeding, but exacerbated by supratherapeutic INR. No colonoscopy on file to know if any previous findings.  No apparent abdominal pain to suggest ischemia, no evidence of that on imaging.  2. Acute blood loss anemia, stable over the last several days since before bleeding started.  3. Supratherapeutic INR, has not yet responded to low-dose vitamin K.  4.  Ascites from severe hypoalbuminemia in the setting of anasarca and cardiac disease.  5.  Primary biliary cholangitis, stable.  Mechanical mitral valve  Acute on chronic renal failure which is unfortunately worsening in the last several days.  Plan:  Her hemoglobin has remained stable despite this bleeding, and the most important issue to attend to in this regard is her persistently elevated INR.  I certainly understand the concern about potentially being overly aggressive with vitamin K replacement, as she has a mechanical mitral valve.  However, it would seem that additional cautious doses of vitamin K are in order since the current plan has not been sufficient correct his INR.  No colonoscopy is planned at this point.  Her medical condition is too tenuous for that, and her INR must be attended to first.  I recommend serial checks of hemoglobin and hematocrit, another INR check later today and more vitamin K if necessary.  I will message her triad physician about this.  We will follow her closely with you, and Dr. Carlean Purl will be the primary hospital GI consultant going forward.   Thank you for the courtesy of this consult.  Please contact me with any questions or concerns.  Total time 70 minutes, complex patient, extensive chart review and work-up of testing thus far was required in addition to communication with provider team.   Nelida Meuse III Office:  [phone]

## 2019-02-07 NOTE — Progress Notes (Addendum)
ANTICOAGULATION CONSULT NOTE  Pharmacy Consult for warfarin Indication: atrial fibrillation and mechanical MVR  Vital Signs: Temp: 97.7 F (36.5 C) (09/30 0506) BP: 110/52 (09/30 0506) Pulse Rate: 90 (09/30 0506)  Labs: Recent Labs    02/05/19 0819 02/05/19 1630 02/06/19 0314 02/06/19 1550 02/06/19 2124 02/07/19 0352  HGB 9.8* 10.9* 10.3* 9.4* 10.0* 10.1*  HCT 29.5* 32.2* 29.4* 26.6* 28.3* 28.6*  PLT 139* 129* 136*  --   --  166  LABPROT 44.7*  --  51.1*  --   --  49.6*  INR 4.9*  --  5.8*  --   --  5.6*  CREATININE 2.71*  --  2.99*  --   --  3.21*    Estimated Creatinine Clearance: 16.4 mL/min (A) (by C-G formula based on SCr of 3.21 mg/dL (H)).  Assessment: 68 y.o. female admitted with CHF/SOB, h/o Afib and mechanical MVR, to continue warfarin. PTA regimen is 4 mg daily, noted prior goal 2-3 has been adjusted to 2.5-3.5 this admit by cardiology. INR has been elevated likely due to vitamin K deficiency. Vitamin K given 9/22 and again 9/29.  INR this morning remains elevated, CBC stable, vitamin K ordered again this morning. Will continue holding warfarin, if INR drops below 2.5 will need to begin heparin infusion to cover mechanical valve.  Goal of Therapy:  INR 2.5-3.5 Monitor platelets by anticoagulation protocol: Yes   Plan:  -Hold warfarin -Daily INR   Arrie Senate, PharmD, BCPS Clinical Pharmacist [phone] Please check AMION for all [place] numbers 02/07/2019

## 2019-02-07 NOTE — Progress Notes (Signed)
[name]  PROGRESS NOTE    Yanice Maqueda Arico  OIB:[id number] DOB: 08/01/1950 DOA: 01/12/2019 PCP: Debbrah Alar, NP   Brief Narrative:   Marie Doyle Lipfordis a 68 y.o. 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 Patient states over the last 2 weeks patient has been getting progressively short of breath on exertion. Denies any chest pain fever chills or productive cough. Patient's primary care physician advised her to increase her Lasix twice daily which she is supposed to start today. Because of worsening shortness of breath patient came to the ER. EMS was called and patient initially was placed on BiPAP. Patient also had complained of some left calf cramping.   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   S/P CABG x 1   Acute on chronic systolic CHF (congestive heart failure) (HCC)   Atrial fibrillation, chronic   Cellulitis of left leg   Acute renal failure (HCC)   Primary biliary cirrhosis (HCC)   Chest pain at rest   H/O mitral valve replacement with mechanical valve   Atypical atrial flutter (HCC)   AKI (acute kidney injury) (HCC)   LLE cellulitis     - Bld Cx, UCx NGTD      - WBC improved to 13.7, afebrile     - vanc/ceftriaxone - narrowed to ancef per ID     - ID c/s due to slow to improve -> improved on ancef - recommending keflex at discharge (9/25)     -Patient also with loose stools (possibly from Ursodiol) however patient has been chronically on this medication. - lactoferrin negative and c diff negative     - PT rec SNF     - continue ancef  LLE Swelling     - suspect this  is due to above - elevate LE above heart, continue to monitor     - given significant LLE edema, more than expected, CT abdomen/pelvis without contrast obtained on 9/28 to eval for mass affecting venous return - notable for cirrhosis, anasarca - see 9/28 CT report     - some concern for posterior knee bleed vs compartment syndrome on 9/22 -> but low suspicion for this based on imaging and exam with soft compartments     - CT was not suggestive of hematoma, but did have marked skin thickening and subcutaneous edema     - LLE [place] from 9/22 read an notable for "significant fluid in posterior calf, unclear etiology" - discussed this with vascular to ensure this did not appear to be fluid collection and he noted appeared c/w edema     -pt with good distal pulses, will hold off on vascular c/s  Acute renal failure (baseline Cr 1.0) NGMA Hyponatremia     - SCr 3.2 today; slightly up     - nephro onboard, appreciate assistance  Acute respiratory failure with hypoxia     - resolved  Chest pain acute started 9/21 Elevated Troponin     -Patient borderline hypotensive with MAP 60-63     -EKG: atrial flutter, nonspecific ST-T wave changes no changes from previous EKG     -Troponin high-sensitivity 791 ->  718     - cards following and aware - no plans for additional ischemic workup at this point  Acute on chronic systolic CHF     - follow I&O and daily wts      - EF 40-45%     - cards consulted     - metoprolol XL 12.5mg    Mechanical mitral valve     -Continue warfarin, monitor INR     - Therapeutic INR goal 2.5-3.5     - Supratherapeutic; repeat INR this afternoon; may need more Vit K per GI  Atrial fibrillation chronic     -Currently NSR     -Anticoagulated for her mechanical mitral valve     - Consider DCCV prior to d/c (vs TEE/DCCV if pt INR becomes subtherapeutic)      - per cards, going to discuss with EP given suspicion for atach   Primary biliary cirrhosis(PBC)      -Ursodial 300 mg BID.      - benadryl for itching  Diarrhea     -9/22 patient reports diarrhea to cardiology     - Fecal fat wnl     - GI pathogen panel by PCR negative      - Lactoferrin negative  Anemia     - Hbg stable; monitor  Hypotension     - resolved  DVT prophylaxis: supratherapeutic Code Status: FULL Family Communication: With family at bedside   Disposition Plan: TBD  Consultants:   GI  Neprhology  Cardiology  Antimicrobials:  . Ancef   ROS:  Unable to obtain d/t mentation  Subjective: No acute events ON per nursing  Objective: Vitals:   02/07/19 0448 02/07/19 0506 02/07/19 0800 02/07/19 0805  BP:  (!) 110/52 114/64 114/64  Pulse: 87 90 87 88  Resp: 15 13 16    Temp:  97.7 F (36.5 C)    TempSrc:      SpO2: 99% 100% 100%   Weight:  77.5 kg    Height:        Intake/Output Summary (Last 24 hours) at 02/07/2019 1541 Last data filed at 02/07/2019 1533 Gross per 24 hour  Intake 650 ml  Output 50 ml  Net 600 ml   Filed Weights   02/05/19 0355 02/06/19 0635 02/07/19 0506  Weight: 74.1 kg 77.5 kg 77.5 kg    Examination:  General: 68 y.o. female resting in bed in NAD Eyes: PERRL, normal sclera Cardiovascular: RRR, +S1, S2, no m/g/r, equal pulses throughout Respiratory: CTABL, no w/r/r, normal WOB GI: BS+, NDNT, no masses noted, no organomegaly noted MSK: No c/c; LLE edema Skin: No rashes, bruises, ulcerations noted Neuro: alert to name, follows some commands   Data Reviewed: I have personally reviewed following labs and imaging studies.  CBC: Recent Labs  Lab 02/04/19 0641  02/05/19 0819 02/05/19 1630 02/06/19 0314 02/06/19 1550 02/06/19 2124 02/07/19 0352 02/07/19 0919  WBC 15.3*  --  17.6* 16.3* 17.0*  --   --  13.7*  --   NEUTROABS  --   --   --  15.2*  --   --   --   --   --   HGB 9.8*   < > 9.8* 10.9* 10.3* 9.4* 10.0* 10.1* 9.7*  HCT 28.7*   < > 29.5* 32.2* 29.4* 26.6* 28.3* 28.6* 28.7*  MCV 84.9  --  86.3 83.9 83.3   --   --  82.9  --   PLT 127*  --  139* 129* 136*  --   --  166  --    < > = values in this interval not displayed.   Basic Metabolic Panel: Recent Labs  Lab 02/02/19 0342 02/03/19 0323 02/04/19 0641 02/05/19 0819 02/06/19 0314 02/07/19 0352  NA 136 133* 133* 134* 130* 130*  K 3.9 4.3 4.4 5.0 5.0 5.3*  CL 105 100 100 97* 97* 98  CO2 20* 21* 21* 22 21* 20*  GLUCOSE 106* 114* 108* 93 85 66*  BUN 62* 65* 79* 83* 85* 92*  CREATININE 1.52* 1.66* 2.32* 2.71* 2.99* 3.21*  CALCIUM 8.5* 8.4* 8.4* 8.4* 8.4* 8.4*  MG 2.3 2.3 2.3 2.3  --  2.4  PHOS  --   --   --   --  3.0 3.8   GFR: Estimated Creatinine Clearance: 16.4 mL/min (A) (by C-G formula based on SCr of 3.21 mg/dL (H)). Liver Function Tests: Recent Labs  Lab 02/01/19 0248 02/02/19 0342 02/03/19 0323 02/04/19 0641 02/05/19 0819 02/06/19 0314 02/07/19 0352  AST 34 39 40 38 44*  --   --   ALT 13 15 10 6  <5  --   --   ALKPHOS 90 80 85 75 76  --   --   BILITOT 1.8* 2.0* 1.4* 1.4* 1.7*  --   --   PROT 7.3 7.9 8.2* 8.3* 8.7*  --   --   ALBUMIN 2.2* 2.2* 2.2* 2.1* 2.0* 2.0* 1.9*   No results for input(s): LIPASE, AMYLASE in the last 168 hours. No results for input(s): AMMONIA in the last 168 hours. Coagulation Profile: Recent Labs  Lab 02/03/19 0323 02/04/19 0641 02/05/19 0819 02/06/19 0314 02/07/19 0352  INR 4.5* 5.1* 4.9* 5.8* 5.6*   Cardiac Enzymes: No results for input(s): CKTOTAL, CKMB, CKMBINDEX, TROPONINI in the last 168 hours. BNP (last 3 results) No results for input(s): PROBNP in the last 8760 hours. HbA1C: No results for input(s): HGBA1C in the last 72 hours. CBG: No results for input(s): GLUCAP in the last 168 hours. Lipid Profile: No results for input(s): CHOL, HDL, LDLCALC, TRIG, CHOLHDL, LDLDIRECT in the last 72 hours. Thyroid Function Tests: No results for input(s): TSH, T4TOTAL, FREET4, T3FREE, THYROIDAB in the last 72 hours. Anemia Panel: No results for input(s): VITAMINB12, FOLATE, FERRITIN,  TIBC, IRON, RETICCTPCT in the last 72 hours. Sepsis Labs: Recent Labs  Lab 01/31/19 1927 02/01/19 0248 02/02/19 0342  PROCALCITON 2.09 2.37 1.97    Recent Results (from the past 240 hour(s))  Culture, Urine     Status: Abnormal   Collection Time: 01/28/19  6:40 PM   Specimen: Urine, Random  Result Value Ref Range Status   Specimen Description URINE, RANDOM  Final   Special Requests NONE  Final   Culture (A)  Final    <10,000 COLONIES/mL INSIGNIFICANT GROWTH Performed at [facility], [address] [place], [place] [address]    Report Status 01/29/2019 FINAL  Final  Culture, blood (routine x 2)     Status: None   Collection Time: 02/01/19  8:50 AM   Specimen: BLOOD  Result Value Ref Range Status   Specimen Description BLOOD LEFT ANTECUBITAL  Final   Special Requests   Final    BOTTLES DRAWN AEROBIC AND ANAEROBIC Blood Culture adequate volume   Culture   Final    NO GROWTH 5 DAYS Performed at [facility], [place] [address], [place], [place] [address]    Report Status 02/06/2019 FINAL  Final  Culture, blood (routine x 2)     Status:  None   Collection Time: 02/01/19  8:55 AM   Specimen: BLOOD RIGHT HAND  Result Value Ref Range Status   Specimen Description BLOOD RIGHT HAND  Final   Special Requests   Final    BOTTLES DRAWN AEROBIC AND ANAEROBIC Blood Culture adequate volume   Culture   Final    NO GROWTH 5 DAYS Performed at [facility], [address] [place] [employer], [place] [address]    Report Status 02/06/2019 FINAL  Final      Radiology Studies: Ct Abdomen Pelvis Wo Contrast  Result Date: 02/05/2019 CLINICAL DATA:  Lower extremity edema, history of primary biliary cholangitis EXAM: CT ABDOMEN AND PELVIS WITHOUT CONTRAST TECHNIQUE: Multidetector CT imaging of the abdomen and pelvis was performed following the standard protocol without IV contrast. COMPARISON:  CT 08/09/2017 FINDINGS: Lower chest: Small bilateral effusions. Central vascular crowding is  noted. Punctate granuloma noted right lung base. There is a prior mitral valve replacement and atrial occlusion device noted over the cardiac silhouette. There is extensive severe coronary artery calcification as well as dense coronary calcification of the aortic root. Post CABG changes are noted as well with multiple sternal wires which appear intact. Hepatobiliary: Slightly nodular opacity surface contour. No visible or contour deforming hepatic lesions. No frank gallbladder wall thickening. No visible gallstones. Slight prominence of the biliary tree is nonspecific given patient age. Pancreas: Unremarkable. No pancreatic ductal dilatation or surrounding inflammatory changes. Spleen: Normal in size without focal abnormality. Adrenals/Urinary Tract: Adrenal glands are unremarkable. Kidneys are normal, without renal calculi, visible renal lesion, or hydronephrosis. Bladder is unremarkable. Stomach/Bowel: Ingested contrast traverses to the level of the transverse colon. Distal esophagus, stomach and duodenal sweep are unremarkable. No bowel wall thickening or dilatation. No evidence of obstruction. Patient chart indicates history of appendectomy. Insert colon surgical clip noted near the rectosigmoid junction. Vascular/Lymphatic: Extensive severe atherosclerotic calcification. Luminal evaluation is not possible in the absence of contrast. Multiple a prominent though nonenlarged lymph nodes are present throughout the abdomen and groins. No pathologically enlarged nodes in the imaged chains. Reproductive: Retroverted uterus. Few calcified fibroids. No concerning adnexal lesions are visualized. Other: There is extensive severe body wall edema most pronounced over the left lateral abdominal wall and both flanks. There is a moderate volume of low-attenuation ascites in the abdomen as well. No free intraperitoneal air. Few surgical clips are seen in the anterior abdomen and low pelvis. Musculoskeletal: Body wall edema, as  above. Mild muscle atrophy. Multilevel degenerative changes are present in the imaged portions of the spine. Most pronounced findings are present at L5-S1 IMPRESSION: 1. Slightly nodular opacity surface contour of the liver, suggestive of cirrhosis and compatible with history of primary biliary cholangitis. No visible or contour deforming hepatic lesions. 2. Moderate volume of low-attenuation ascites in the abdomen and pelvis, and severe body wall edema, consistent with anasarca. 3. Extensive severe atherosclerotic calcification of the aorta and its branches. Luminal evaluation is not possible in the absence of contrast. 4. Small bilateral effusions. 5. Aortic Atherosclerosis (ICD10-I70.0). 6. Prior CABG, mitral valve replacement and aortic occlusion clip placement. 7. Leiomyomatous uterus. Electronically Signed   By: Lovena Le M.D.   On: 02/05/2019 23:19     Scheduled Meds: . atorvastatin  40 mg Oral q1800  . feeding supplement (ENSURE ENLIVE)  237 mL Oral BID BM  . metoprolol succinate  12.5 mg Oral Daily  . pantoprazole (PROTONIX) IV  40 mg Intravenous Q12H  . ursodiol  300 mg Oral BID  .  Warfarin - Pharmacist Dosing Inpatient   Does not apply q1800   Continuous Infusions: .  ceFAZolin (ANCEF) IV 2 g (02/07/19 0524)     LOS: 14 days    Time spent: 25 minutes spent in the coordination of care today.    Jonnie Finner, DO [facility] Pager [phone]  If 7PM-7AM, please contact night-coverage [email or web address] Password [email or web address] 02/07/2019, 3:41 PM

## 2019-02-07 NOTE — Progress Notes (Signed)
Critical INR 5.8.  Provider on call notified via [place]. Await return call/orders. Jessie Foot, RN

## 2019-02-07 NOTE — Progress Notes (Signed)
ANTICOAGULATION CONSULT NOTE  Pharmacy Consult for warfarin Indication: atrial fibrillation and mechanical MVR  Vital Signs: Temp: 98.3 F (36.8 C) (09/30 1602) BP: 99/58 (09/30 1602) Pulse Rate: 89 (09/30 1602)  Labs: Recent Labs    02/05/19 0819 02/05/19 1630 02/06/19 0314  02/07/19 0352 02/07/19 0919 02/07/19 1547  HGB 9.8* 10.9* 10.3*   < > 10.1* 9.7* 9.3*  HCT 29.5* 32.2* 29.4*   < > 28.6* 28.7* 27.8*  PLT 139* 129* 136*  --  166  --   --   LABPROT 44.7*  --  51.1*  --  49.6*  --  35.7*  INR 4.9*  --  5.8*  --  5.6*  --  3.7*  CREATININE 2.71*  --  2.99*  --  3.21*  --   --    < > = values in this interval not displayed.    Estimated Creatinine Clearance: 16.4 mL/min (A) (by C-G formula based on SCr of 3.21 mg/dL (H)).  Assessment: 68 y.o. female admitted with CHF/SOB, h/o Afib and mechanical MVR, to continue warfarin. PTA regimen is 4 mg daily, noted prior goal 2-3 has been adjusted to 2.5-3.5 this admit by cardiology. INR has been elevated likely due to vitamin K deficiency. Vitamin K given 9/22 and again 9/29.  INR this afternoon remains elevated at 3.7, CBC stable, vitamin K ordered again this morning. Will continue holding warfarin, if INR drops below 2.5 will need to begin heparin infusion to cover mechanical valve.  Goal of Therapy:  INR 2.5-3.5 Monitor platelets by anticoagulation protocol: Yes   Plan:  -Continue to hold warfarin -f/u daily INR in AM   Laurencia Roma A. Levada Dy, PharmD, BCPS, FNKF Clinical Pharmacist [place] Please utilize Amion for appropriate phone number to reach the unit pharmacist ([place])   02/07/2019

## 2019-02-08 ENCOUNTER — Telehealth: Payer: Self-pay

## 2019-02-08 DIAGNOSIS — K921 Melena: Secondary | ICD-10-CM

## 2019-02-08 DIAGNOSIS — R791 Abnormal coagulation profile: Secondary | ICD-10-CM

## 2019-02-08 LAB — RENAL FUNCTION PANEL
Albumin: 2.1 g/dL — ABNORMAL LOW (ref 3.5–5.0)
Anion gap: 12 (ref 5–15)
BUN: 98 mg/dL — ABNORMAL HIGH (ref 8–23)
CO2: 19 mmol/L — ABNORMAL LOW (ref 22–32)
Calcium: 8.5 mg/dL — ABNORMAL LOW (ref 8.9–10.3)
Chloride: 97 mmol/L — ABNORMAL LOW (ref 98–111)
Creatinine, Ser: 3.34 mg/dL — ABNORMAL HIGH (ref 0.44–1.00)
GFR calc Af Amer: 16 mL/min — ABNORMAL LOW (ref >60)
GFR calc non Af Amer: 14 mL/min — ABNORMAL LOW (ref >60)
Glucose, Bld: 73 mg/dL (ref 70–99)
Phosphorus: 4.6 mg/dL (ref 2.5–4.6)
Potassium: 6.2 mmol/L — ABNORMAL HIGH (ref 3.5–5.1)
Sodium: 128 mmol/L — ABNORMAL LOW (ref 135–145)

## 2019-02-08 LAB — HEMOGLOBIN AND HEMATOCRIT, BLOOD
HCT: 25.7 % — ABNORMAL LOW (ref 36.0–46.0)
HCT: 24.5 % — ABNORMAL LOW (ref 36.0–46.0)
HCT: 24.5 % — ABNORMAL LOW (ref 36.0–46.0)
Hemoglobin: 8.6 g/dL — ABNORMAL LOW (ref 12.0–15.0)
Hemoglobin: 8.5 g/dL — ABNORMAL LOW (ref 12.0–15.0)
Hemoglobin: 8.4 g/dL — ABNORMAL LOW (ref 12.0–15.0)

## 2019-02-08 LAB — PROTIME-INR
INR: 2.9 — ABNORMAL HIGH (ref 0.8–1.2)
INR: 3.1 — ABNORMAL HIGH (ref 0.8–1.2)
Prothrombin Time: 29.5 seconds — ABNORMAL HIGH (ref 11.4–15.2)
Prothrombin Time: 31.7 seconds — ABNORMAL HIGH (ref 11.4–15.2)

## 2019-02-08 LAB — CBC
HCT: 26.4 % — ABNORMAL LOW (ref 36.0–46.0)
Hemoglobin: 9 g/dL — ABNORMAL LOW (ref 12.0–15.0)
MCH: 29 pg (ref 26.0–34.0)
MCHC: 34.1 g/dL (ref 30.0–36.0)
MCV: 85.2 fL (ref 80.0–100.0)
Platelets: 151 10*3/uL (ref 150–400)
RBC: 3.1 MIL/uL — ABNORMAL LOW (ref 3.87–5.11)
RDW: 18.6 % — ABNORMAL HIGH (ref 11.5–15.5)
WBC: 12.3 10*3/uL — ABNORMAL HIGH (ref 4.0–10.5)
nRBC: 0.5 % — ABNORMAL HIGH (ref 0.0–0.2)

## 2019-02-08 LAB — MAGNESIUM: Magnesium: 2.4 mg/dL (ref 1.7–2.4)

## 2019-02-08 MED ORDER — FUROSEMIDE 10 MG/ML IJ SOLN
80.0000 mg | Freq: Once | INTRAMUSCULAR | Status: AC
  Administered 2019-02-08: 80 mg via INTRAVENOUS
  Filled 2019-02-08: qty 8

## 2019-02-08 MED ORDER — ALBUMIN HUMAN 25 % IV SOLN
25.0000 g | Freq: Once | INTRAVENOUS | Status: AC
  Administered 2019-02-08: 25 g via INTRAVENOUS
  Filled 2019-02-08: qty 100
  Filled 2019-02-08: qty 50

## 2019-02-08 MED ORDER — SODIUM ZIRCONIUM CYCLOSILICATE 10 G PO PACK
10.0000 g | PACK | Freq: Three times a day (TID) | ORAL | Status: AC
  Administered 2019-02-08 – 2019-02-09 (×4): 10 g via ORAL
  Filled 2019-02-08 (×4): qty 1

## 2019-02-08 NOTE — Progress Notes (Signed)
[facility] ROUNDING NOTE   Subjective: Reported small volume of maroon-colored blood per rectum overnight.  She otherwise denies abdominal pain, and no nausea/vomiting.  INR down to 2.9 today, and hemoglobin largely stable at 9.0 from 9.3 yesterday.  Objective: Vital signs in last 24 hours: Temp:  [97.8 F (36.6 C)-98.3 F (36.8 C)] 98.2 F (36.8 C) (10/01 0436) Pulse Rate:  [87-93] 87 (10/01 0345) Resp:  [13-14] 14 (10/01 0345) BP: (99-117)/(58-66) 111/61 (10/01 0345) SpO2:  [92 %-100 %] 97 % (10/01 0345) Weight:  [75.4 kg] 75.4 kg (10/01 0500) Last BM Date: 02/07/19 General: NAD Lungs: CTA Heart: RRR, no murmur Abdomen: Soft, NT, ND, +BS Ext: Dressing in place in LLE, LE edema    Intake/Output from previous day: 09/30 0701 - 10/01 0700 In: 1010 [P.O.:810; IV Piggyback:200] Out: 350 [Urine:300] Intake/Output this shift: No intake/output data recorded.   Lab Results: Recent Labs    02/06/19 0314  02/07/19 0352  02/07/19 2142 02/08/19 0323 02/08/19 1037  WBC 17.0*  --  13.7*  --   --  12.3*  --   HGB 10.3*   < > 10.1*   < > 9.3* 9.0* 8.6*  PLT 136*  --  166  --   --  151  --   MCV 83.3  --  82.9  --   --  85.2  --    < > = values in this interval not displayed.   BMET Recent Labs    02/06/19 0314 02/07/19 0352 02/08/19 0312  NA 130* 130* 128*  K 5.0 5.3* 6.2*  CL 97* 98 97*  CO2 21* 20* 19*  GLUCOSE 85 66* 73  BUN 85* 92* 98*  CREATININE 2.99* 3.21* 3.34*  CALCIUM 8.4* 8.4* 8.5*   LFT Recent Labs    02/06/19 0314 02/07/19 0352 02/08/19 0312  ALBUMIN 2.0* 1.9* 2.1*   PT/INR Recent Labs    02/07/19 1547 02/08/19 0323  INR 3.7* 2.9*      Imaging/Other results: No results found.    Assessment and Plan:  1) Hematochezia 2) Supratherapeutic INR 3) Acute blood loss anemia  Reported small-volume maroon colored stool overnight, much improved from previous. Supratherapeutic INR in the setting of antibiotics, responsive to  vitamin K x2, INR now at 2.9.  Bleeding seems to work slowed/stopped at this point.  Hemoglobin largely stable at 9.  Baseline prior to admission appears to be around 10.8.  Discussed with the patient and her family member present in the room today, and no plan for colonoscopy at this juncture given overall tenuous clinical status, lack of active bleeding, stable hemoglobin.  - Daily hemoglobin trend - Daily INR trend with additional vitamin K as appropriate - Observation for continued bleeding  4) History of PBC-stable 5) Ascites 2/2 severe hypoalbuminemia in the setting of anasarca and cardiac disease -On ursodiol  6) Hyperkalemia 7) AKI - Being treated by Nephrology with albumin/Lasix today  8) LLE cellulitis: -Antimicrobial therapy per primary Hospitalist service and [place], DO  02/08/2019, 11:16 AM [facility] Pager [phone]

## 2019-02-08 NOTE — Progress Notes (Signed)
Marie Malone  PROGRESS NOTE    Marie Malone  ONG:[id number] DOB: 1951/02/17 DOA: 01/10/2019 PCP: Debbrah Alar, NP   Brief Narrative:   Karn Marie Lipfordis a 68 y.o. 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 Patient states over the last 2 weeks patient has been getting progressively short of breath on exertion. Denies any chest pain fever chills or productive cough. Patient's primary care physician advised her to increase her Lasix twice daily which she is supposed to start today. Because of worsening shortness of breath patient came to the ER. EMS was called and patient initially was placed on BiPAP. Patient also had complained of some left calf cramping.  10/1: More interactive today. K+ up ON.    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   S/P CABG x 1   Acute on chronic systolic CHF (congestive heart failure) (HCC)   Atrial fibrillation, chronic (HCC)   Cellulitis of left leg   Acute renal failure (HCC)   Primary biliary cirrhosis (HCC)   Chest pain at rest   H/O mitral valve replacement with mechanical valve   Atypical atrial flutter (HCC)   AKI (acute kidney injury) ([place])   Hematochezia   Supratherapeutic INR   LLE cellulitis     - Bld Cx, UCx NGTD      - WBC improved to 13.7, afebrile     - vanc/ceftriaxone - narrowed to ancef per ID     - ID c/s due to slow to improve -> improved on ancef - recommending keflex at discharge (9/25)     -Patient also with loose stools (possibly from Ursodiol) however patient has been chronically on this medication. - lactoferrin negative and c  diff negative     - PT rec SNF     - continue ancef     - leg pain is improving; able to manipulate her leg more today  LLE Swelling     - suspect this is due to above - elevate LE above heart, continue to monitor     - given significant LLE edema, more than expected, CT abdomen/pelvis without contrast obtained on 9/28 to eval for mass affecting venous return - notable for cirrhosis, anasarca - see 9/28 CT report     - some concern for posterior knee bleed vs compartment syndrome on 9/22 -> but low suspicion for this based on imaging and exam with soft compartments     - CT was not suggestive of hematoma, but did have marked skin thickening and subcutaneous edema     - LLE [place] from 9/22 read an notable for "significant fluid in posterior calf, unclear etiology" - discussed this with vascular to ensure this did not appear to be fluid collection and he noted appeared c/w edema     -pt with good distal pulses, will hold off on vascular c/s     - see above  Acute renal failure (baseline Cr 1.0) NGMA Hyponatremia Hyperkalemia     - SCr 3.3 today; slightly up     - nephro onboard, appreciate assistance     - K+ 6.2; nephro has given lokelma; appreciate assistance  Acute respiratory failure with hypoxia     - resolved  Chest pain acute started 9/21 Elevated Troponin     -Patient borderline hypotensive with MAP 60-63     -EKG: atrial flutter, nonspecific ST-T wave changes no changes from previous EKG     -Troponin high-sensitivity 791 -> 718     - cards following and aware - no plans for additional ischemic workup at this point  Acute on chronic systolic CHF     - follow I&O and daily wts      - EF 40-45%     - cards consulted     - metoprolol XL 12.5mg    Mechanical mitral valve     -Continue warfarin, monitor INR     - Therapeutic INR goal 2.5-3.5     - Supratherapeutic; repeat INR this afternoon; may need more Vit K per GI     - INR is 2.9 today  Atrial fibrillation  chronic     -Currently NSR     -Anticoagulated for her mechanical mitral valve     - Consider DCCV prior to d/c (vs TEE/DCCV if pt INR becomes subtherapeutic)      - per cards, going to discuss with EP given suspicion for atach   Primary biliary cirrhosis(PBC)     -Ursodial 300 mg BID.      - benadryl for itching  Diarrhea     -9/22 patient reports diarrhea to cardiology     - Fecal fat wnl     - GI pathogen panel by PCR negative      - Lactoferrin negative  Anemia     - Hbg stable; monitor     - INR 2.9 today; Hgb down to 9 from 10 yesterday; GI onboard, conservative management for now  Hypotension     - resolved  DVT prophylaxis: INR now therapeutic Code Status: FULL Family Communication: With family at bedside   Disposition Plan: TBD  Consultants:   GI  Neprhology  Cardiology  Antimicrobials:   Ancef   ROS:  Denies CP, dyspnea, N, V. Remainder 10-pt ROS is negative for all not previously mentioned.  Subjective: "Oooh. Don't touch it if you don't have to."  Objective: Vitals:   02/08/19 0345 02/08/19 0436 02/08/19 0500 02/08/19 1410  BP: 111/61   108/62  Pulse: 87   81  Resp: 14   17  Temp:  98.2 F (36.8 C)  97.9 F (36.6 C)  TempSrc:  Oral  Oral  SpO2: 97%   98%  Weight:   75.4 kg   Height:        Intake/Output Summary (Last 24 hours) at 02/08/2019 1457 Last data filed at 02/08/2019 6270 Gross per 24 hour  Intake 460 ml  Output 300 ml  Net 160 ml   Filed Weights   02/06/19 0635 02/07/19 0506 02/08/19 0500  Weight: 77.5 kg 77.5 kg 75.4 kg    Examination:  General: 68 y.o. female resting in bed in NAD Eyes: PERRL, normal sclera Cardiovascular: RRR, +S1, S2, no m/g/r, equal pulses throughout Respiratory: CTABL, no w/r/r, normal WOB GI: BS+, NDNT, no masses noted, no organomegaly noted MSK: No c/c; LLE edema; able to manipulate LLE more today Skin: No rashes, bruises, ulcerations noted Neuro: alert to name, follows some  commands   Data Reviewed: I have personally reviewed following labs and imaging studies.  CBC: Recent Labs  Lab 02/05/19 0819 02/05/19 1630 02/06/19 0314  02/07/19 0352 02/07/19 0919 02/07/19 1547 02/07/19  2142 02/08/19 0323 02/08/19 1037  WBC 17.6* 16.3* 17.0*  --  13.7*  --   --   --  12.3*  --   NEUTROABS  --  15.2*  --   --   --   --   --   --   --   --   HGB 9.8* 10.9* 10.3*   < > 10.1* 9.7* 9.3* 9.3* 9.0* 8.6*  HCT 29.5* 32.2* 29.4*   < > 28.6* 28.7* 27.8* 26.5* 26.4* 25.7*  MCV 86.3 83.9 83.3  --  82.9  --   --   --  85.2  --   PLT 139* 129* 136*  --  166  --   --   --  151  --    < > = values in this interval not displayed.   Basic Metabolic Panel: Recent Labs  Lab 02/03/19 0323 02/04/19 0641 02/05/19 0819 02/06/19 0314 02/07/19 0352 02/08/19 0312  NA 133* 133* 134* 130* 130* 128*  K 4.3 4.4 5.0 5.0 5.3* 6.2*  CL 100 100 97* 97* 98 97*  CO2 21* 21* 22 21* 20* 19*  GLUCOSE 114* 108* 93 85 66* 73  BUN 65* 79* 83* 85* 92* 98*  CREATININE 1.66* 2.32* 2.71* 2.99* 3.21* 3.34*  CALCIUM 8.4* 8.4* 8.4* 8.4* 8.4* 8.5*  MG 2.3 2.3 2.3  --  2.4 2.4  PHOS  --   --   --  3.0 3.8 4.6   GFR: Estimated Creatinine Clearance: 15.5 mL/min (A) (by C-G formula based on SCr of 3.34 mg/dL (H)). Liver Function Tests: Recent Labs  Lab 02/02/19 0342 02/03/19 0323 02/04/19 0641 02/05/19 0819 02/06/19 0314 02/07/19 0352 02/08/19 0312  AST 39 40 38 44*  --   --   --   ALT 15 10 6  <5  --   --   --   ALKPHOS 80 85 75 76  --   --   --   BILITOT 2.0* 1.4* 1.4* 1.7*  --   --   --   PROT 7.9 8.2* 8.3* 8.7*  --   --   --   ALBUMIN 2.2* 2.2* 2.1* 2.0* 2.0* 1.9* 2.1*   No results for input(s): LIPASE, AMYLASE in the last 168 hours. No results for input(s): AMMONIA in the last 168 hours. Coagulation Profile: Recent Labs  Lab 02/05/19 0819 02/06/19 0314 02/07/19 0352 02/07/19 1547 02/08/19 0323  INR 4.9* 5.8* 5.6* 3.7* 2.9*   Cardiac Enzymes: No results for input(s):  CKTOTAL, CKMB, CKMBINDEX, TROPONINI in the last 168 hours. BNP (last 3 results) No results for input(s): PROBNP in the last 8760 hours. HbA1C: No results for input(s): HGBA1C in the last 72 hours. CBG: No results for input(s): GLUCAP in the last 168 hours. Lipid Profile: No results for input(s): CHOL, HDL, LDLCALC, TRIG, CHOLHDL, LDLDIRECT in the last 72 hours. Thyroid Function Tests: No results for input(s): TSH, T4TOTAL, FREET4, T3FREE, THYROIDAB in the last 72 hours. Anemia Panel: No results for input(s): VITAMINB12, FOLATE, FERRITIN, TIBC, IRON, RETICCTPCT in the last 72 hours. Sepsis Labs: Recent Labs  Lab 02/02/19 0342  PROCALCITON 1.97    Recent Results (from the past 240 hour(s))  Culture, blood (routine x 2)     Status: None   Collection Time: 02/01/19  8:50 AM   Specimen: BLOOD  Result Value Ref Range Status   Specimen Description BLOOD LEFT ANTECUBITAL  Final   Special Requests   Final    BOTTLES DRAWN AEROBIC AND  ANAEROBIC Blood Culture adequate volume   Culture   Final    NO GROWTH 5 DAYS Performed at [facility], [place] [address], [place], [place] [address]    Report Status 02/06/2019 FINAL  Final  Culture, blood (routine x 2)     Status: None   Collection Time: 02/01/19  8:55 AM   Specimen: BLOOD RIGHT HAND  Result Value Ref Range Status   Specimen Description BLOOD RIGHT HAND  Final   Special Requests   Final    BOTTLES DRAWN AEROBIC AND ANAEROBIC Blood Culture adequate volume   Culture   Final    NO GROWTH 5 DAYS Performed at [facility], [place] [address], [place], [place] [address]    Report Status 02/06/2019 FINAL  Final      Radiology Studies: No results found.   Scheduled Meds: . atorvastatin  40 mg Oral q1800  . feeding supplement (ENSURE ENLIVE)  237 mL Oral BID BM  . metoprolol succinate  12.5 mg Oral Daily  . pantoprazole (PROTONIX) IV  40 mg Intravenous Q12H  . sodium zirconium cyclosilicate  10 g Oral TID  . ursodiol  300  mg Oral BID   Continuous Infusions: .  ceFAZolin (ANCEF) IV Stopped (02/08/19 0658)     LOS: 15 days    Time spent: 25 minutes spent in the coordination of care today.    Jonnie Finner, DO [facility] Pager [phone]  If 7PM-7AM, please contact night-coverage [email or web address] Password [email or web address] 02/08/2019, 2:57 PM

## 2019-02-08 NOTE — TOC Progression Note (Signed)
Transition of Care [facility]) - Progression Note    Patient Details  Name: JOLEE CRITCHER MRN: [id number] Date of Birth: 1950-06-30  Transition of Care [facility]) CM/SW [place], LCSW Phone Number: 02/08/2019, 1:36 PM  Clinical Narrative:  Pt has been accepted my [facility] MD notified. Will need orders/face to face.      Expected Discharge Plan: [place] Barriers to Discharge: Continued Medical Work up  Expected Discharge Plan and Services Expected Discharge Plan: [place] In-house Referral: NA Discharge Planning Services: CM Consult Post Acute Care Choice: [place] arrangements for the past 2 months: Single Family Home                           HH Arranged: OT, PT, PCS/Personal Care Services [facility] Agency: [place] Date [place]: 02/08/19 Time [place]: 0312 Representative spoke with at [place]: [address] ([place]) Interventions    Readmission Risk Interventions Readmission Risk Prevention Plan 01/30/2019  Transportation Screening Complete  Home Care Screening Complete  Medication Review (RN CM) Complete  Some recent data might be hidden

## 2019-02-08 NOTE — Progress Notes (Signed)
Physical Therapy Treatment Patient Details Name: Marie Malone MRN: [id number] DOB: 06-21-1950 Today's Date: 02/08/2019    History of Present Illness Marie Malone is a 68 y.o. 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   Work up includes acute respiratory failure with hypoxia, A/C CHF, and L LE pain due to cellulitis vs SIRS.    PT Comments    Focus of session was to be transfer to recliner however in preparation for transfer pt found to have moderate rectal bleeding. RN notified and PT provided modA for rolling R and L for pericare. During cleaning, pt noted to have active rectal bleeding. Transfer deferred and will try in next session. In supine pt prefers external rotation of L hip and L knee flexion. Pt and daughter educated in need to improve positioning with bolstering to insure neutral hip rotation and knee extension to maintain full ROM while pt is limited in her movement.   Follow Up Recommendations  SNF     Equipment Recommendations  None recommended by PT       Precautions / Restrictions Precautions Precautions: Fall Precaution Comments: skin breakdown LLE Restrictions Weight Bearing Restrictions: No    Mobility  Bed Mobility Overal bed mobility: Needs Assistance Bed Mobility: Rolling Rolling: Max assist         General bed mobility comments: maxA for management of LE, pt able to reach in both directions to initiate movement  Transfers                 General transfer comment: deferred movement due to active rectal bleeding  Ambulation/Gait             General Gait Details: declined          Cognition Arousal/Alertness: Lethargic Behavior During Therapy: Flat affect Overall Cognitive Status: Within Functional Limits for tasks assessed                                  General Comments: very lethargic today, limited answers to questions      Exercises General Exercises - Lower Extremity Ankle Circles/Pumps: Both;5 reps;AROM;Seated Hip Flexion/Marching: AROM;10 reps;Right    General Comments General comments (skin integrity, edema, etc.): daughter present in room and educated on limiting pt preference of L hip external rotation and L knee flexion for long periods of time and working on placement/propping of LE in neutral hip rotation and knee flexion      Pertinent Vitals/Pain Pain Assessment: Faces Faces Pain Scale: Hurts whole lot Pain Location: abdominal >L LE Pain Descriptors / Indicators: Grimacing Pain Intervention(s): Monitored during session;Limited activity within patient's tolerance;Repositioned           PT Goals (current goals can now be found in the care plan section) Acute Rehab PT Goals PT Goal Formulation: With patient Time For Goal Achievement: 02/21/19 Potential to Achieve Goals: Fair Progress towards PT goals: Not progressing toward goals - comment(unable to progress due to lethargy and hemmorrage )    Frequency    Min 3X/week      PT Plan Current plan remains appropriate       AM-PAC PT "6 Clicks" Mobility   Outcome Measure  Help needed turning from your back to your side while in a flat bed without using bedrails?: A Lot Help  needed moving from lying on your back to sitting on the side of a flat bed without using bedrails?: A Lot Help needed moving to and from a bed to a chair (including a wheelchair)?: Total Help needed standing up from a chair using your arms (e.g., wheelchair or bedside chair)?: Total Help needed to walk in hospital room?: Total Help needed climbing 3-5 steps with a railing? : Total 6 Click Score: 8    End of Session   Activity Tolerance: Patient limited by pain Patient left: in bed;with call bell/phone within reach;with nursing/sitter in room Nurse Communication: Mobility  status PT Visit Diagnosis: Other abnormalities of gait and mobility (R26.89);Pain Pain - Right/Left: Left Pain - part of body: Leg     Time: 0177-9390 PT Time Calculation (min) (ACUTE ONLY): 22 min  Charges:  $Therapeutic Exercise: 8-22 mins                     Ascher Schroepfer B. Migdalia Dk PT, DPT Acute Rehabilitation Services Pager [phone] Office [phone]    [place] 02/08/2019, 11:32 AM

## 2019-02-08 NOTE — Telephone Encounter (Signed)
lmom for overdue inr 

## 2019-02-08 NOTE — Progress Notes (Signed)
[facility] for warfarin Indication: atrial fibrillation and mechanical MVR  Vital Signs: Temp: 97.9 F (36.6 C) (10/01 1410) Temp Source: Oral (10/01 1410) BP: 108/62 (10/01 1410) Pulse Rate: 81 (10/01 1410)  Labs: Recent Labs    02/06/19 0314  02/07/19 0352  02/07/19 1547  02/08/19 0312 02/08/19 0323 02/08/19 1037 02/08/19 1528 02/08/19 1800  HGB 10.3*   < > 10.1*   < > 9.3*   < >  --  9.0* 8.6* 8.5*  --   HCT 29.4*   < > 28.6*   < > 27.8*   < >  --  26.4* 25.7* 24.5*  --   PLT 136*  --  166  --   --   --   --  151  --   --   --   LABPROT 51.1*  --  49.6*  --  35.7*  --   --  29.5*  --   --  31.7*  INR 5.8*  --  5.6*  --  3.7*  --   --  2.9*  --   --  3.1*  CREATININE 2.99*  --  3.21*  --   --   --  3.34*  --   --   --   --    < > = values in this interval not displayed.    Estimated Creatinine Clearance: 15.5 mL/min (A) (by C-G formula based on SCr of 3.34 mg/dL (H)).  Assessment: 68 y.o. female admitted with CHF/SOB, h/o Afib and mechanical MVR, to continue warfarin. PTA regimen is 4 mg daily, noted prior goal 2-3 has been adjusted to 2.5-3.5 this admit by cardiology. INR has been elevated likely due to vitamin K deficiency. Vitamin K given 9/22 and again 9/29 and 9/30.  INR this morning is now therapeutic at 2.9, Hgb down slightly from 10.1 to 9, GI following, melena ongoing. Discussed with cardiology - will hold further warfarin for now and begin heparin infusion once INr < 2.5 to cover mechanical valve.  PM update: INR at 1800 is 3.1 (slightly up). Will not start heparin at this time.   Goal of Therapy:  INR 2.5-3.5 Heparin level 0.3-0.5 Monitor platelets by anticoagulation protocol: Yes   Plan:  -Hold warfarin -Check INR in AM - heparin if INR < 2.5   Jerian Morais A. Levada Dy, PharmD, BCPS, FNKF Clinical Pharmacist [place] Please utilize Amion for appropriate phone number to reach the unit pharmacist ([place])   02/08/2019

## 2019-02-08 NOTE — Progress Notes (Signed)
Patient ID: Marie Malone, female   DOB: 1950-05-24, 68 y.o.   MRN: [id number] [facility] Progress Note   Assessment/ Plan:   1.  Acute kidney injury:  Some urine output overnight-remains oliguric.  Differential diagnosis include ATN and acute interstitial nephritis (AIN) with available labs favoring hemodynamically mediated AKI/ATN.  Renal ultrasound negative for any obstruction and abdominal/pelvic imaging showing ascites/abdominal wall edema.  Her urine sodium of <10 indicates that she has decreased effective arterial blood volume (I suspect this has largely to do with pulmonary hypertension/right heart failure).  Slight rise of BUN and creatinine noted overnight with associated hyperkalemia for which I will attempt albumin/Lasix.  If urine output does not improve any electrolyte abnormalities persist, may require transient hemodialysis. 2.  Hypoxic respiratory failure: Secondary to acute exacerbation of diastolic heart failure in this patient with pulmonary hypertension.  We will give albumin/furosemide today. 3.  Left leg cellulitis:  No evidence of intra-abdominal/pelvic mass that could be interfering with left leg venous drainage.  Continue treatment for cellulitis per primary service. 4.  Anemia: Possibly secondary to chronic disease and recent loss.  Supra therapeutic INR with BRBPR; slow downward trend of hemoglobin/hematocrit noted.  5.  History of mechanical mitral valve: On anticoagulation with warfarin 6.  History of primary biliary cirrhosis 7. Hyperkalemia: Continue renal diet, re-dose Lokelma and attempt augmenting diuresis with Lasix/albumin  Subjective:   Denies any acute events overnight   Objective:   BP 111/61 (BP Location: Left Arm)   Pulse 87   Temp 98.2 F (36.8 C) (Oral)   Resp 14   Ht 5\' 2"  (1.575 m)   Wt 75.4 kg   SpO2 97%   BMI 30.40 kg/m   Intake/Output Summary (Last 24 hours) at 02/08/2019 0920 Last data filed at 02/08/2019 0658 Gross per 24  hour  Intake 960 ml  Output 350 ml  Net 610 ml   Weight change: -2.1 kg  Physical Exam: Gen: Comfortably resting in bed, awakens easily to conversation CVS: Pulse regular rhythm, normal rate, normal S1 and S2 Resp: Decreased breath sounds over both bases, no distinct rhonchi Abd: Soft, slightly distended, bowel sounds normal Ext: 2+ left leg edema, trace-1+ right leg edema  Imaging: No results found.  Labs: BMET Recent Labs  Lab 02/02/19 0342 02/03/19 0323 02/04/19 0641 02/05/19 0819 02/06/19 0314 02/07/19 0352 02/08/19 0312  NA 136 133* 133* 134* 130* 130* 128*  K 3.9 4.3 4.4 5.0 5.0 5.3* 6.2*  CL 105 100 100 97* 97* 98 97*  CO2 20* 21* 21* 22 21* 20* 19*  GLUCOSE 106* 114* 108* 93 85 66* 73  BUN 62* 65* 79* 83* 85* 92* 98*  CREATININE 1.52* 1.66* 2.32* 2.71* 2.99* 3.21* 3.34*  CALCIUM 8.5* 8.4* 8.4* 8.4* 8.4* 8.4* 8.5*  PHOS  --   --   --   --  3.0 3.8 4.6   CBC Recent Labs  Lab 02/05/19 1630 02/06/19 0314  02/07/19 0352 02/07/19 0919 02/07/19 1547 02/07/19 2142 02/08/19 0323  WBC 16.3* 17.0*  --  13.7*  --   --   --  12.3*  NEUTROABS 15.2*  --   --   --   --   --   --   --   HGB 10.9* 10.3*   < > 10.1* 9.7* 9.3* 9.3* 9.0*  HCT 32.2* 29.4*   < > 28.6* 28.7* 27.8* 26.5* 26.4*  MCV 83.9 83.3  --  82.9  --   --   --  85.2  PLT 129* 136*  --  166  --   --   --  151   < > = values in this interval not displayed.    Medications:    . atorvastatin  40 mg Oral q1800  . feeding supplement (ENSURE ENLIVE)  237 mL Oral BID BM  . metoprolol succinate  12.5 mg Oral Daily  . pantoprazole (PROTONIX) IV  40 mg Intravenous Q12H  . sodium zirconium cyclosilicate  10 g Oral TID  . ursodiol  300 mg Oral BID  . Warfarin - Pharmacist Dosing Inpatient   Does not apply [id number]   Elmarie Shiley, MD 02/08/2019, 9:20 AM

## 2019-02-08 NOTE — Progress Notes (Signed)
[facility] for warfarin Indication: atrial fibrillation and mechanical MVR  Vital Signs: Temp: 98.2 F (36.8 C) (10/01 0436) Temp Source: Oral (10/01 0436) BP: 111/61 (10/01 0345) Pulse Rate: 87 (10/01 0345)  Labs: Recent Labs    02/06/19 0314  02/07/19 0352  02/07/19 1547 02/07/19 2142 02/08/19 0312 02/08/19 0323  HGB 10.3*   < > 10.1*   < > 9.3* 9.3*  --  9.0*  HCT 29.4*   < > 28.6*   < > 27.8* 26.5*  --  26.4*  PLT 136*  --  166  --   --   --   --  151  LABPROT 51.1*  --  49.6*  --  35.7*  --   --  29.5*  INR 5.8*  --  5.6*  --  3.7*  --   --  2.9*  CREATININE 2.99*  --  3.21*  --   --   --  3.34*  --    < > = values in this interval not displayed.    Estimated Creatinine Clearance: 15.5 mL/min (A) (by C-G formula based on SCr of 3.34 mg/dL (H)).  Assessment: 68 y.o. female admitted with CHF/SOB, h/o Afib and mechanical MVR, to continue warfarin. PTA regimen is 4 mg daily, noted prior goal 2-3 has been adjusted to 2.5-3.5 this admit by cardiology. INR has been elevated likely due to vitamin K deficiency. Vitamin K given 9/22 and again 9/29 and 9/30.  INR this morning is now therapeutic at 2.9, Hgb down slightly from 10.1 to 9, GI following, melena ongoing. Discussed with cardiology - will hold further warfarin for now and begin heparin infusion once INr < 2.5 to cover mechanical valve.  Goal of Therapy:  INR 2.5-3.5 Heparin level 0.3-0.5 Monitor platelets by anticoagulation protocol: Yes   Plan:  -Hold warfarin -Check INR at 1800 - heparin if INR < 2.5   Arrie Senate, PharmD, BCPS Clinical Pharmacist [phone] Please check AMION for all [place] numbers 02/08/2019

## 2019-02-08 NOTE — Progress Notes (Addendum)
Progress Note  Patient Name: Marie Malone Date of Encounter: 02/08/2019  Primary Cardiologist: [name], MD   Subjective   Patient appears lethargic. Minimal BRBPR overnight. Denies chest pain. Admits to belly pain since yesterday. Left leg pain is improved.   Inpatient Medications    Scheduled Meds: . atorvastatin  40 mg Oral q1800  . feeding supplement (ENSURE ENLIVE)  237 mL Oral BID BM  . furosemide  80 mg Intravenous Once  . metoprolol succinate  12.5 mg Oral Daily  . pantoprazole (PROTONIX) IV  40 mg Intravenous Q12H  . sodium zirconium cyclosilicate  10 g Oral TID  . ursodiol  300 mg Oral BID  . Warfarin - Pharmacist Dosing Inpatient   Does not apply q1800   Continuous Infusions: . albumin human    .  ceFAZolin (ANCEF) IV Stopped (02/08/19 0658)   PRN Meds: diphenhydrAMINE, docusate sodium, morphine injection, ondansetron **OR** ondansetron (ZOFRAN) IV, oxyCODONE **OR** oxyCODONE   Vital Signs    Vitals:   02/07/19 2104 02/08/19 0345 02/08/19 0436 02/08/19 0500  BP: 117/62 111/61    Pulse: 88 87    Resp:  14    Temp: 97.8 F (36.6 C)  98.2 F (36.8 C)   TempSrc: Oral  Oral   SpO2: 100% 97%    Weight:    75.4 kg  Height:        Intake/Output Summary (Last 24 hours) at 02/08/2019 0938 Last data filed at 02/08/2019 0658 Gross per 24 hour  Intake 960 ml  Output 350 ml  Net 610 ml   Last 3 Weights 02/08/2019 02/07/2019 02/06/2019  Weight (lbs) 166 lb 3.6 oz 170 lb 13.7 oz 170 lb 13.7 oz  Weight (kg) 75.4 kg 77.5 kg 77.5 kg      Telemetry    2:1 aflutter, HR 80-90s; 5 beat run NSVT this morning - Personally Reviewed  ECG    No new - Personally Reviewed  Physical Exam   GEN: No acute distress; lethargic Neck: minimal JVD Cardiac: RRR, systolic murmurs, rubs, or gallops.  Respiratory: Clear to auscultation bilaterally. GI: Soft, nontender, non-distended  MS: 1+ edema; No deformity. Neuro:  Nonfocal  Psych: Normal affect   Labs     High Sensitivity Troponin:   Recent Labs  Lab 01/24/19 0259 01/24/19 0738 01/24/19 0807 01/29/19 1635 01/29/19 1835  TROPONINIHS 132* 111* 113* 791* 718*      Chemistry Recent Labs  Lab 02/03/19 0323 02/04/19 0641 02/05/19 0819 02/06/19 0314 02/07/19 0352 02/08/19 0312  NA 133* 133* 134* 130* 130* 128*  K 4.3 4.4 5.0 5.0 5.3* 6.2*  CL 100 100 97* 97* 98 97*  CO2 21* 21* 22 21* 20* 19*  GLUCOSE 114* 108* 93 85 66* 73  BUN 65* 79* 83* 85* 92* 98*  CREATININE 1.66* 2.32* 2.71* 2.99* 3.21* 3.34*  CALCIUM 8.4* 8.4* 8.4* 8.4* 8.4* 8.5*  PROT 8.2* 8.3* 8.7*  --   --   --   ALBUMIN 2.2* 2.1* 2.0* 2.0* 1.9* 2.1*  AST 40 38 44*  --   --   --   ALT 10 6 <5  --   --   --   ALKPHOS 85 75 76  --   --   --   BILITOT 1.4* 1.4* 1.7*  --   --   --   GFRNONAA 32* 21* 17* 15* 14* 14*  GFRAA 37* 24* 20* 18* 16* 16*  ANIONGAP 12 12 15 12 12  12  Hematology Recent Labs  Lab 02/06/19 0314  02/07/19 0352  02/07/19 1547 02/07/19 2142 02/08/19 0323  WBC 17.0*  --  13.7*  --   --   --  12.3*  RBC 3.53*  --  3.45*  --   --   --  3.10*  HGB 10.3*   < > 10.1*   < > 9.3* 9.3* 9.0*  HCT 29.4*   < > 28.6*   < > 27.8* 26.5* 26.4*  MCV 83.3  --  82.9  --   --   --  85.2  MCH 29.2  --  29.3  --   --   --  29.0  MCHC 35.0  --  35.3  --   --   --  34.1  RDW 17.3*  --  17.5*  --   --   --  18.6*  PLT 136*  --  166  --   --   --  151   < > = values in this interval not displayed.    BNPNo results for input(s): BNP, PROBNP in the last 168 hours.   DDimer No results for input(s): DDIMER in the last 168 hours.   Radiology    No results found.  Cardiac Studies   Echo 01/24/19 1. Left ventricular ejection fraction, by visual estimation, is 45 to 50%. The left ventricle has mildly decreased function. Normal left ventricular size. Left ventricular septal wall thickness was normal. Normal left ventricular posterior wall  thickness. There is no left ventricular hypertrophy. 2. Indeterminate  diastolic function due to mechanical mitral valve pattern of LV diastolic filling. 3. Right ventricular pressure overload and right ventricular volume overload. 4. Global right ventricle has moderately reduced systolic function.The right ventricular size is moderately enlarged. No increase in right ventricular wall thickness. 5. Left atrial size was moderately dilated. 6. Right atrial size was severely dilated. 7. The mitral valve has been repaired/replaced. No evidence of mitral valve regurgitation. No evidence of mitral stenosis. 8. The tricuspid valve is normal in structure. Tricuspid valve regurgitation mild-moderate. 9. The aortic valve The aortic valve is normal in structure. Aortic valve regurgitation was not visualized by color flow Doppler. Structurally normal aortic valve, with no evidence of sclerosis or stenosis. 10. The pulmonic valve was normal in structure. Pulmonic valve regurgitation is mild by color flow Doppler. 11. Moderately elevated pulmonary artery systolic pressure. 12. The inferior vena cava is dilated in size with <50% respiratory variability, suggesting right atrial pressure of 15 mmHg.  Right and left cardiac catheterization 03/25/2016:   left ventricular systolic function is normal.  The left ventricular ejection fraction is 55-65% by visual estimate.  There is no aortic valve stenosis.  There is moderate mitral valve stenosis. Mean gradient 10 mm Hg. MV area 1.3 cm2  Mid Cx to Dist Cx lesion, 10 %stenosed.  Ost 1st Mrg to 1st Mrg lesion, 20 %stenosed.  Prox RCA lesion, 100 %stenosed. Left to right and right to right collaterals are present.  Hemodynamic findings consistent with moderate pulmonary hypertension and mitral valve stenosis.  LV end diastolic pressure is normal.  Patient Profile     68 y.o. female with a history of rheumatic fever/mitral valve stenosis s/p valvuloplasty at [facility] in 2006, CAD s/p CABG, mechanical MVR, paroxysmal afib  with MAZE procedure in 2018 on coumadin, PAD s/p endarterectomy in 03/2016 who was admitted on 9/16 for acute heart failure in the setting of left leg cellulitis, complicated by aflutter, now found to have  worsening AKI.   Assessment & Plan    Acute on Chronic combined systolic and diastolic heart failure Patient presented 9/15 with SOB and BNP elevated to 1600. CXR with pulmonary edema and small right pleural effusion - Decompensation possibly from diet noncompliance and afib - Echo showed EF 45-50% with right ventricular dysfunction.  - Was on IV lasix but was stopped due to diarrhea on 9/18>> resumed on 9/25 for worsening LE edema and then stopped again 9/27 for worsening creatinine and IVF bolus was given - Creatinine continues to worsen, 3.31 today - ARB d/c last week - US renalnegative for obstruction.  - CT abdomen and pelvis suggestive for anasarca - Patient has 1+ edema on exam >> will defer volume management to Nephrology - Per Nephrology albumin and Lasix   2:1 aflutter/Chronic afibwith h/o of maze procedure in 2018: -Patient was in afib on admission and was thought to have converted toNSR vs 2:1 aflutter vs 2:1 atach -After discussing with EP rhythm appears patient is in 2:1 [place] = 4 ( age, female, CHF, vascular dz) -Continue metoprolol for rate control -INR 2.9 today after multiple doses of Vitamin K -Had evidence of blood in stool earlier yesterday, hemoglobin 10.1 > 9.0 - Anticipate change to heparin per pharmacy if INR drops below 2.5.  -Plan for TEE/DCCV once patient is stable. Procedure will possibly be outpatient.   LLE cellulitiswith swelling: -WBC 13.7 > 12.3 -CT negative for hematoma. Per Vascular more consistent with edema -Abx per ID  AKI - renal function continues to worsen 2.23 > 2.71 > 2.99 > 3.21 > 3.34 - US renal negative for obstruction - CT abd/pelvis was consistent with anasarca - Nephrology following >> AKI continues to be  oliguric. Potassium 6.2. Patient might require HD - Volume status per nephrology  Hyperkalemia - 6.2 today >> on lokelma per nephrology - Patient might need HD  Anemia - Hgb 10.2 > 9.0 today. Baseline 10-11 - BRBPR noted yesterday as well as overnight - continue to trend CBC - On coumadin >> anticipate transition to heparin per pharmacy - further management per GI  Chest pain/H/o CAD s/p CABG - Hs troponin 791 > 718 and no EKG changes - Denies anginal symptoms - No further ischemic work-up at this time - Continue BB, ASA, and statin  Mechanical MVR/Rheumatic mitral valve disease - INR 2.9 today after administration of Vitamin K - Goal 2.5 - 3.5 - IV heparin per pharmacy once INR drops below 2.5  For questions or updates, please contact [facility] Please consult [email or web address] for contact info under        Signed, Cadence Ninfa Meeker, PA-C  02/08/2019, 9:38 AM    Patient seen and examined.  Agree with above documentation.  On exam, she is somnolent but arousable, 1+ bilateral lower extremity edema, no JVD, lungs are clear to auscultation bilaterally, regular rate and rhythm, no murmurs.  She continues to deny any chest pain or dyspnea.  She received vitamin K, with improvement in her INR to 2.9 today.  She had small amount of bright red blood per rectum overnight.  No plans for colonoscopy per GI given tenuous clinical status.  Renal function continues to worsen, nephrology following and planning Lasix/albumin today.  She remains in 2:1 atrial flutter.  We will plan TEE/cardioversion once more clinically stable with resolution of GI bleeding.  She remains well rate-controlled.  Donato Heinz, MD

## 2019-02-08 DEATH — deceased

## 2019-02-09 ENCOUNTER — Inpatient Hospital Stay (HOSPITAL_COMMUNITY): Payer: Medicare Other

## 2019-02-09 ENCOUNTER — Telehealth: Payer: Self-pay | Admitting: Family

## 2019-02-09 ENCOUNTER — Encounter (HOSPITAL_COMMUNITY): Payer: Self-pay | Admitting: Interventional Radiology

## 2019-02-09 HISTORY — PX: IR FLUORO GUIDE CV LINE RIGHT: IMG2283

## 2019-02-09 HISTORY — PX: IR US GUIDE VASC ACCESS RIGHT: IMG2390

## 2019-02-09 LAB — HEPATITIS PANEL, ACUTE
HCV Ab: NONREACTIVE
Hep A IgM: NONREACTIVE
Hep B C IgM: NONREACTIVE
Hepatitis B Surface Ag: NONREACTIVE

## 2019-02-09 LAB — RENAL FUNCTION PANEL
Albumin: 2.3 g/dL — ABNORMAL LOW (ref 3.5–5.0)
Anion gap: 15 (ref 5–15)
BUN: 100 mg/dL — ABNORMAL HIGH (ref 8–23)
CO2: 19 mmol/L — ABNORMAL LOW (ref 22–32)
Calcium: 8.6 mg/dL — ABNORMAL LOW (ref 8.9–10.3)
Chloride: 97 mmol/L — ABNORMAL LOW (ref 98–111)
Creatinine, Ser: 3.6 mg/dL — ABNORMAL HIGH (ref 0.44–1.00)
GFR calc Af Amer: 14 mL/min — ABNORMAL LOW (ref >60)
GFR calc non Af Amer: 12 mL/min — ABNORMAL LOW (ref >60)
Glucose, Bld: 67 mg/dL — ABNORMAL LOW (ref 70–99)
Phosphorus: 5 mg/dL — ABNORMAL HIGH (ref 2.5–4.6)
Potassium: 4.2 mmol/L (ref 3.5–5.1)
Sodium: 131 mmol/L — ABNORMAL LOW (ref 135–145)

## 2019-02-09 LAB — CBC
HCT: 24.5 % — ABNORMAL LOW (ref 36.0–46.0)
Hemoglobin: 8.3 g/dL — ABNORMAL LOW (ref 12.0–15.0)
MCH: 28.7 pg (ref 26.0–34.0)
MCHC: 33.9 g/dL (ref 30.0–36.0)
MCV: 84.8 fL (ref 80.0–100.0)
Platelets: 214 10*3/uL (ref 150–400)
RBC: 2.89 MIL/uL — ABNORMAL LOW (ref 3.87–5.11)
RDW: 18.1 % — ABNORMAL HIGH (ref 11.5–15.5)
WBC: 11.9 10*3/uL — ABNORMAL HIGH (ref 4.0–10.5)
nRBC: 0.7 % — ABNORMAL HIGH (ref 0.0–0.2)

## 2019-02-09 LAB — HEMOGLOBIN AND HEMATOCRIT, BLOOD
HCT: 25.1 % — ABNORMAL LOW (ref 36.0–46.0)
HCT: 24.8 % — ABNORMAL LOW (ref 36.0–46.0)
HCT: 26 % — ABNORMAL LOW (ref 36.0–46.0)
Hemoglobin: 8.3 g/dL — ABNORMAL LOW (ref 12.0–15.0)
Hemoglobin: 8.7 g/dL — ABNORMAL LOW (ref 12.0–15.0)
Hemoglobin: 8.9 g/dL — ABNORMAL LOW (ref 12.0–15.0)

## 2019-02-09 LAB — PROTIME-INR
INR: 3.1 — ABNORMAL HIGH (ref 0.8–1.2)
Prothrombin Time: 31.7 seconds — ABNORMAL HIGH (ref 11.4–15.2)

## 2019-02-09 LAB — MAGNESIUM: Magnesium: 2.3 mg/dL (ref 1.7–2.4)

## 2019-02-09 LAB — HEPATITIS B CORE ANTIBODY, TOTAL: Hep B Core Total Ab: NONREACTIVE

## 2019-02-09 MED ORDER — SODIUM CHLORIDE 0.9 % IV SOLN: 100.0000 mL | INTRAVENOUS | Status: DC | PRN

## 2019-02-09 MED ORDER — LIDOCAINE-PRILOCAINE 2.5-2.5 % EX CREA
1.0000 "application " | TOPICAL_CREAM | CUTANEOUS | Status: DC | PRN
  Filled 2019-02-09: qty 5

## 2019-02-09 MED ORDER — ALTEPLASE 2 MG IJ SOLR: 2.0000 mg | Freq: Once | INTRAMUSCULAR | Status: DC | PRN

## 2019-02-09 MED ORDER — HEPARIN SODIUM (PORCINE) 1000 UNIT/ML DIALYSIS
1000.0000 [IU] | INTRAMUSCULAR | Status: DC | PRN
  Administered 2019-02-09: 1000 [IU] via INTRAVENOUS_CENTRAL
  Filled 2019-02-09 (×2): qty 1

## 2019-02-09 MED ORDER — PENTAFLUOROPROP-TETRAFLUOROETH EX AERO: 1.0000 "application " | INHALATION_SPRAY | CUTANEOUS | Status: DC | PRN

## 2019-02-09 MED ORDER — HEPARIN SODIUM (PORCINE) 1000 UNIT/ML IJ SOLN
INTRAMUSCULAR | Status: AC
  Filled 2019-02-09: qty 1

## 2019-02-09 MED ORDER — LIDOCAINE HCL (PF) 1 % IJ SOLN: 5.0000 mL | INTRAMUSCULAR | Status: DC | PRN

## 2019-02-09 MED ORDER — LIDOCAINE HCL 1 % IJ SOLN
INTRAMUSCULAR | Status: AC
  Filled 2019-02-09: qty 20

## 2019-02-09 MED ORDER — LIDOCAINE HCL (PF) 1 % IJ SOLN
INTRAMUSCULAR | Status: DC | PRN
  Administered 2019-02-09: 5 mL

## 2019-02-09 MED ORDER — HEPARIN SODIUM (PORCINE) 1000 UNIT/ML IJ SOLN
INTRAMUSCULAR | Status: AC
  Filled 2019-02-09: qty 3

## 2019-02-09 NOTE — Progress Notes (Addendum)
Patient ID: Marie Malone, female   DOB: 1950-10-07, 68 y.o.   MRN: [id number]    Progress Note   Subjective  Day # 17 CC; GI bleeding/supra therapeutic INR  Patient continues to complain of ongoing constant abdominal pain, she says difficult to describe.  Generally uncomfortable all over, she is unaware of any bowel movements or bleeding overnight  INR 3.1 hgb 9.0 yesterday ,8.4 this am   Objective   Vital signs in last 24 hours: Temp:  [97.9 F (36.6 C)] 97.9 F (36.6 C) (10/02 0357) Pulse Rate:  [81-89] 89 (10/02 0902) Resp:  [12-17] 15 (10/02 0357) BP: (100-118)/(55-62) 100/60 (10/02 0902) SpO2:  [92 %-98 %] 96 % (10/02 0357) Weight:  [74.5 kg] 74.5 kg (10/02 0357) Last BM Date: 02/08/19 General:   Chronically ill-appearing older African-American female in NAD, appears uncomfortable Heart:  Regular rate and rhythm; no murmurs Lungs: Respirations even and unlabored, lungs CTA bilaterally Abdomen:  Soft, no guarding or rebound, mild generalized tenderness nondistended. Normal bowel sounds. Extremities:  Without edema. Neurologic:  Alert and oriented,  grossly normal neurologically. Psych:  Cooperative. Normal mood and affect.  Intake/Output from previous day: 10/01 0701 - 10/02 0700 In: -  Out: 425 [Urine:425] Intake/Output this shift: No intake/output data recorded.  Lab Results: Recent Labs    02/07/19 0352  02/08/19 0323  02/08/19 1528 02/08/19 2144 02/09/19 0403  WBC 13.7*  --  12.3*  --   --   --  11.9*  HGB 10.1*   < > 9.0*   < > 8.5* 8.4* 8.3*  HCT 28.6*   < > 26.4*   < > 24.5* 24.5* 24.5*  PLT 166  --  151  --   --   --  214   < > = values in this interval not displayed.   BMET Recent Labs    02/07/19 0352 02/08/19 0312 02/09/19 0403  NA 130* 128* 131*  K 5.3* 6.2* 4.2  CL 98 97* 97*  CO2 20* 19* 19*  GLUCOSE 66* 73 67*  BUN 92* 98* 100*  CREATININE 3.21* 3.34* 3.60*  CALCIUM 8.4* 8.5* 8.6*   LFT Recent Labs    02/09/19 0403  ALBUMIN  2.3*   PT/INR Recent Labs    02/08/19 1800 02/09/19 0403  LABPROT 31.7* 31.7*  INR 3.1* 3.1*        Assessment / Plan:    #47 68 year old African-American female with multiple significant comorbidities with abdominal pain and GI bleeding in setting of supratherapeutic INR.  No active bleeding Last EGD 2019 no varices  Continuing supportive care at present, no endoscopic intervention anticipated at this time   #2 hypoxic respiratory failure-exacerbation of diastolic CHF, history of pulmonary hypertension  #3 left lower extremity cellulitis #4 anemia multifactoral-mild drift overnight no active bleeding #5 status post mechanical mitral valve-on Coumadin #6 primary biliary cirrhosis-with ascites/anasarca #7 acute kidney injury-nephrology following anticipating short-term dialysis     LOS: 16 days   Amy Esterwood PA-C 02/09/2019, 9:02 AM   [facility] Attending   I have taken an interval history, reviewed the chart and  Saw the patient. I agree with the Advanced Practitioner's note, impression and recommendations.   At this point no GI procedures indicated Hopefully she will respond to and tolerate dialysis  We will follow every 1-2 days  Gatha Mayer, MD, [facility] 02/09/2019 1:40 PM Pager [phone]

## 2019-02-09 NOTE — Progress Notes (Signed)
ANTICOAGULATION CONSULT NOTE  Pharmacy Consult for warfarin to heparin Indication: atrial fibrillation and mechanical MVR  Vital Signs: Temp: 97.9 F (36.6 C) (10/02 0357) Temp Source: Oral (10/02 0357) BP: 100/60 (10/02 0902) Pulse Rate: 89 (10/02 0902)  Labs: Recent Labs    02/07/19 0352  02/08/19 0312 02/08/19 0323  02/08/19 1528 02/08/19 1800 02/08/19 2144 02/09/19 0403  HGB 10.1*   < >  --  9.0*   < > 8.5*  --  8.4* 8.3*  HCT 28.6*   < >  --  26.4*   < > 24.5*  --  24.5* 24.5*  PLT 166  --   --  151  --   --   --   --  214  LABPROT 49.6*   < >  --  29.5*  --   --  31.7*  --  31.7*  INR 5.6*   < >  --  2.9*  --   --  3.1*  --  3.1*  CREATININE 3.21*  --  3.34*  --   --   --   --   --  3.60*   < > = values in this interval not displayed.    Estimated Creatinine Clearance: 14.3 mL/min (A) (by C-G formula based on SCr of 3.6 mg/dL (H)).  Assessment: 68 y.o. female admitted with CHF/SOB, h/o Afib and mechanical MVR, to continue warfarin. PTA regimen is 4 mg daily, noted prior goal 2-3 has been adjusted to 2.5-3.5 this admit by cardiology. INR has been elevated likely due to vitamin K deficiency. Vitamin K given 9/22 and again 9/29 and 9/30.  INR this morning remains therapeutic but has increased to 3.1. CBC stable. Warfarin on hold, will begin IV heparin once INR < 2.5 to cover mechanical valve.  Goal of Therapy:  INR 2.5-3.5 Heparin level 0.3-0.5 Monitor platelets by anticoagulation protocol: Yes   Plan:  -Hold warfarin -Daily INR - heparin if INR < 2.5   Arrie Senate, PharmD, BCPS Clinical Pharmacist [phone] Please check AMION for all [place] numbers 02/09/2019

## 2019-02-09 NOTE — Progress Notes (Signed)
[name]  HQI:[id number] DOB: 11-03-1950 DOA: 01/30/2019 PCP: Debbrah Alar, NP   Brief Narrative:   Marie Yokum Lipfordis a 68 y.o. 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 Patient states over the last 2 weeks patient has been getting progressively short of breath on exertion. Denies any chest pain fever chills or productive cough. Patient's primary care physician advised her to increase her Lasix twice daily which she is supposed to start today. Because of worsening shortness of breath patient came to the ER. EMS was called and patient initially was placed on BiPAP. Patient also had complained of some left calf cramping.  10/1: More interactive today. K+ up ON.  10/2: A little tired today.    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   S/P CABG x 1   Acute on chronic systolic CHF (congestive heart failure) (HCC)   Atrial fibrillation, chronic (HCC)   Cellulitis of left leg   Acute renal failure (HCC)   Primary biliary cirrhosis (HCC)   Chest pain at rest   H/O mitral valve replacement with mechanical valve   Atypical atrial flutter (HCC)   AKI (acute kidney injury) ([place])   Hematochezia   Supratherapeutic INR   LLE cellulitis - Bld Cx, UCx NGTD  - WBC improved to 13.7, afebrile - vanc/ceftriaxone - narrowed to ancef per ID - ID c/s due to slow to improve ->improved on ancef - recommending keflex at discharge (9/25) -Patient also with loose stools (possibly from Ursodiol) however patient has been chronically on this  medication. - lactoferrin negative and c diff negative - PT rec SNF - continue ancef     - leg pain is improving; able to manipulate her leg more today     - stop abx today and monitor  LLE Swelling - suspect this is due to above - elevate LE above heart, continue to monitor - given significant LLE edema, more than expected, CT abdomen/pelvis without contrast obtained on 9/28 to eval for mass affecting venous return - notable for cirrhosis, anasarca - see 9/28 CT report - some concern for posterior knee bleed vs compartment syndrome on 9/22 ->but low suspicion for this based on imaging and exam with soft compartments - CT was not suggestive of hematoma, but did have marked skin thickening and subcutaneous edema - LLE [place] from 9/22 read an notable for "significant fluid in posterior calf, unclear etiology" - discussed this with vascular to ensure this did not appear to be fluid collection and he noted appeared c/w edema -pt with good distal pulses, will hold off on vascular c/s     - see above  Acute renal failure (baseline Cr 1.0) NGMA Hyponatremia Hyperkalemia - SCr 3.3 today; slightly up - nephro onboard, appreciate assistance     - K+ 6.2; nephro has given lokelma; appreciate assistance  Acute respiratory failure with hypoxia - resolved  Chest pain acute started 9/21 Elevated Troponin -Patient borderline hypotensive with MAP 60-63 -EKG: atrial flutter, nonspecific ST-T wave changes no changes from previous EKG -Troponin high-sensitivity 791 -> 718 - cards following and aware - no  plans for additional ischemic workup at this point  Acute on chronic systolic CHF - follow I&O and daily wts  - EF 40-45% - cards consulted - metoprolol XL 12.69m   Mechanical mitral valve -Continue warfarin, monitor INR - Therapeutic INR goal 2.5-3.5 - Supratherapeutic; repeat INR this afternoon;  may need more Vit K per GI     - INR is 3.1 today  Atrial fibrillation chronic -Currently NSR -Anticoagulated for her mechanical mitral valve - Consider DCCV prior to d/c (vs TEE/DCCV if pt INR becomes subtherapeutic)  - per cards, going to discuss with EP given suspicion for a tach   Primary biliary cirrhosis(PBC) -Ursodial 300 mg BID.  - benadryl for itching  Diarrhea -9/22 patient reports diarrhea to cardiology - Fecal fat wnl - GI pathogen panel by PCR negative  - Lactoferrin negative     - resolved  Anemia - Hbg stable; monitor     - INR 3.1 today; Hgb is stable today; GI onboard, conservative management for now  Hypotension - resolved  Drowsy. Says she feels poorly today, but nonspecific. Able to manipulate LLE more today. Hgb ok at 8.3 today. Conservative mgmt w/ GI. Getting access for dialysis today. Cards looking for TEE early next week. Appreciate all consults assistance.   DVT prophylaxis:INR now therapeutic Code Status:FULL Disposition Plan:TBD  Consultants:   GI  Cardiology  Nephrology  IR  ROS:  Reports general malaise. Denies CP, dyspnea, N, V . Remainder 10-pt ROS is negative for all not previously mentioned.  Subjective: "I just don't feel good."  Objective: Vitals:   02/08/19 2045 02/09/19 0357 02/09/19 0902 02/09/19 1356  BP: (!) 112/55 (!) 118/57 100/60 117/72  Pulse: 89 87 89 85  Resp: 12 15  10   Temp: 97.9 F (36.6 C) 97.9 F (36.6 C)  (!) 97.4 F (36.3 C)  TempSrc: Oral Oral  Axillary  SpO2: 92% 96%  98%  Weight:  74.5 kg    Height:        Intake/Output Summary (Last 24 hours) at 02/09/2019 1553 Last data filed at 02/09/2019 1100 Gross per 24 hour  Intake 118 ml  Output 425 ml  Net -307 ml   Filed Weights   02/07/19 0506 02/08/19 0500 02/09/19 0357  Weight: 77.5 kg 75.4 kg 74.5 kg    Examination:  General:68 y.o.femaleresting in bed in NAD Eyes: PERRL,  normal sclera Cardiovascular: RRR, +S1, S2, no m/g/r, equal pulses throughout Respiratory: CTABL, no w/r/r, normal WOB GI: BS+, NDNT, no masses noted, no organomegaly noted MSK: No c/c; LLE edema; able to manipulate LLE more today Skin: No rashes, bruises, ulcerations noted Neuro:alert to name, follows some commands, drowsy; slow but appropriate responses   Data Reviewed: I have personally reviewed following labs and imaging studies.  CBC: Recent Labs  Lab 02/05/19 1630 02/06/19 0314  02/07/19 0352  02/08/19 0323 02/08/19 1037 02/08/19 1528 02/08/19 2144 02/09/19 0403 02/09/19 1055  WBC 16.3* 17.0*  --  13.7*  --  12.3*  --   --   --  11.9*  --   NEUTROABS 15.2*  --   --   --   --   --   --   --   --   --   --   HGB 10.9* 10.3*   < > 10.1*   < > 9.0* 8.6* 8.5* 8.4* 8.3* 8.3*  HCT 32.2* 29.4*   < > 28.6*   < > 26.4* 25.7* 24.5* 24.5* 24.5* 25.1*  MCV  83.9 83.3  --  82.9  --  85.2  --   --   --  84.8  --   PLT 129* 136*  --  166  --  151  --   --   --  214  --    < > = values in this interval not displayed.   Basic Metabolic Panel: Recent Labs  Lab 02/04/19 0641 02/05/19 0819 02/06/19 0314 02/07/19 0352 02/08/19 0312 02/09/19 0403  NA 133* 134* 130* 130* 128* 131*  K 4.4 5.0 5.0 5.3* 6.2* 4.2  CL 100 97* 97* 98 97* 97*  CO2 21* 22 21* 20* 19* 19*  GLUCOSE 108* 93 85 66* 73 67*  BUN 79* 83* 85* 92* 98* 100*  CREATININE 2.32* 2.71* 2.99* 3.21* 3.34* 3.60*  CALCIUM 8.4* 8.4* 8.4* 8.4* 8.5* 8.6*  MG 2.3 2.3  --  2.4 2.4 2.3  PHOS  --   --  3.0 3.8 4.6 5.0*   GFR: Estimated Creatinine Clearance: 14.3 mL/min (A) (by C-G formula based on SCr of 3.6 mg/dL (H)). Liver Function Tests: Recent Labs  Lab 02/03/19 0323 02/04/19 0641 02/05/19 0819 02/06/19 0314 02/07/19 0352 02/08/19 0312 02/09/19 0403  AST 40 38 44*  --   --   --   --   ALT 10 6 <5  --   --   --   --   ALKPHOS 85 75 76  --   --   --   --   BILITOT 1.4* 1.4* 1.7*  --   --   --   --   PROT 8.2* 8.3*  8.7*  --   --   --   --   ALBUMIN 2.2* 2.1* 2.0* 2.0* 1.9* 2.1* 2.3*   No results for input(s): LIPASE, AMYLASE in the last 168 hours. No results for input(s): AMMONIA in the last 168 hours. Coagulation Profile: Recent Labs  Lab 02/07/19 0352 02/07/19 1547 02/08/19 0323 02/08/19 1800 02/09/19 0403  INR 5.6* 3.7* 2.9* 3.1* 3.1*   Cardiac Enzymes: No results for input(s): CKTOTAL, CKMB, CKMBINDEX, TROPONINI in the last 168 hours. BNP (last 3 results) No results for input(s): PROBNP in the last 8760 hours. HbA1C: No results for input(s): HGBA1C in the last 72 hours. CBG: No results for input(s): GLUCAP in the last 168 hours. Lipid Profile: No results for input(s): CHOL, HDL, LDLCALC, TRIG, CHOLHDL, LDLDIRECT in the last 72 hours. Thyroid Function Tests: No results for input(s): TSH, T4TOTAL, FREET4, T3FREE, THYROIDAB in the last 72 hours. Anemia Panel: No results for input(s): VITAMINB12, FOLATE, FERRITIN, TIBC, IRON, RETICCTPCT in the last 72 hours. Sepsis Labs: No results for input(s): PROCALCITON, LATICACIDVEN in the last 168 hours.  Recent Results (from the past 240 hour(s))  Culture, blood (routine x 2)     Status: None   Collection Time: 02/01/19  8:50 AM   Specimen: BLOOD  Result Value Ref Range Status   Specimen Description BLOOD LEFT ANTECUBITAL  Final   Special Requests   Final    BOTTLES DRAWN AEROBIC AND ANAEROBIC Blood Culture adequate volume   Culture   Final    NO GROWTH 5 DAYS Performed at [facility], [address] [place], [place] [address]    Report Status 02/06/2019 FINAL  Final  Culture, blood (routine x 2)     Status: None   Collection Time: 02/01/19  8:55 AM   Specimen: BLOOD RIGHT HAND  Result Value Ref Range Status   Specimen  Description BLOOD RIGHT HAND  Final   Special Requests   Final    BOTTLES DRAWN AEROBIC AND ANAEROBIC Blood Culture adequate volume   Culture   Final    NO GROWTH 5 DAYS Performed at [facility],  [address] [place], [place] [address]    Report Status 02/06/2019 FINAL  Final      Radiology Studies: Ir Fluoro Guide Cv Line Right  Result Date: 02/09/2019 INDICATION: 68 year old female with a history of renal failure EXAM: TEMPORARY HEMODIALYSIS CATHETER WITH IMAGE GUIDANCE MEDICATIONS: None ANESTHESIA/SEDATION: None FLUOROSCOPY TIME:  Fluoroscopy Time: 0 minutes 6 seconds (1 mGy). COMPLICATIONS: None PROCEDURE: Informed written consent was obtained from the patient's family after a discussion of the risks, benefits, and alternatives to treatment. Questions regarding the procedure were encouraged and answered. The right neck was prepped with chlorhexidine in a sterile fashion, and a sterile drape was applied covering the operative field. Maximum barrier sterile technique with sterile gowns and gloves were used for the procedure. A timeout was performed prior to the initiation of the procedure. A micropuncture kit was utilized to access the right internal jugular vein under direct, real-time ultrasound guidance after the overlying soft tissues were anesthetized with 1% lidocaine with epinephrine. Ultrasound image documentation was performed. The microwire was kinked to measure appropriate catheter length. A stiff glidewire was advanced to the level of the IVC. A 16 cm hemodialysis catheter was then placed over the wire. Final catheter positioning was confirmed and documented with a spot radiographic image. The catheter aspirates and flushes normally. The catheter was flushed with appropriate volume heparin dwells. Dressings were applied. The patient tolerated the procedure well without immediate post procedural complication. IMPRESSION: Status post image guided temporary hemodialysis catheter placement. Signed, Dulcy Fanny. Dellia Nims, RPVI [facility] Electronically Signed   By: Corrie Mckusick D.O.   On: 02/09/2019 13:34   Ir US Guide Vasc Access  Right  Result Date: 02/09/2019 INDICATION: 68 year old female with a history of renal failure EXAM: TEMPORARY HEMODIALYSIS CATHETER WITH IMAGE GUIDANCE MEDICATIONS: None ANESTHESIA/SEDATION: None FLUOROSCOPY TIME:  Fluoroscopy Time: 0 minutes 6 seconds (1 mGy). COMPLICATIONS: None PROCEDURE: Informed written consent was obtained from the patient's family after a discussion of the risks, benefits, and alternatives to treatment. Questions regarding the procedure were encouraged and answered. The right neck was prepped with chlorhexidine in a sterile fashion, and a sterile drape was applied covering the operative field. Maximum barrier sterile technique with sterile gowns and gloves were used for the procedure. A timeout was performed prior to the initiation of the procedure. A micropuncture kit was utilized to access the right internal jugular vein under direct, real-time ultrasound guidance after the overlying soft tissues were anesthetized with 1% lidocaine with epinephrine. Ultrasound image documentation was performed. The microwire was kinked to measure appropriate catheter length. A stiff glidewire was advanced to the level of the IVC. A 16 cm hemodialysis catheter was then placed over the wire. Final catheter positioning was confirmed and documented with a spot radiographic image. The catheter aspirates and flushes normally. The catheter was flushed with appropriate volume heparin dwells. Dressings were applied. The patient tolerated the procedure well without immediate post procedural complication. IMPRESSION: Status post image guided temporary hemodialysis catheter placement. Signed, Dulcy Fanny. Dellia Nims, RPVI [facility] Electronically Signed   By: Corrie Mckusick D.O.   On: 02/09/2019 13:34     Scheduled Meds:  atorvastatin  40 mg Oral q1800   feeding supplement (ENSURE ENLIVE)  237 mL Oral BID BM   heparin       lidocaine        metoprolol succinate  12.5 mg Oral Daily   pantoprazole (PROTONIX) IV  40 mg Intravenous Q12H   ursodiol  300 mg Oral BID   Continuous Infusions:  sodium chloride     sodium chloride       LOS: 16 days    Time spent: 25 minutes spent in the coordination of care today.    Jonnie Finner, DO [facility] Pager [phone]  If 7PM-7AM, please contact night-coverage [email or web address] Password [facility] 02/09/2019, 3:53 PM

## 2019-02-09 NOTE — Progress Notes (Signed)
Patient ID: Marie Malone, female   DOB: 1951-02-27, 68 y.o.   MRN: [id number] [facility] Progress Note   Assessment/ Plan:   1.  Acute kidney injury:  Some urine output overnight-remains oliguric.  Differential diagnosis include ATN and acute interstitial nephritis (AIN) with available labs favoring hemodynamically mediated AKI/ATN.  Renal ultrasound negative for any obstruction and abdominal/pelvic imaging showing ascites/abdominal wall edema.  Her urine sodium of <10 indicates that she has decreased effective arterial blood volume (I suspect this has largely to do with pulmonary hypertension/right heart failure).  Now with subtle uremic symptoms as azotemia worsening without any indications of renal recovery and poor response to diuresis overnight---will request IR to place HD catheter and order for hemodialysis (I suspect this will be short term). 2.  Hypoxic respiratory failure: Secondary to acute exacerbation of diastolic heart failure in this patient with pulmonary hypertension.  Will order HD today with some ultrafiltration. 3.  Left leg cellulitis:  No evidence of intra-abdominal/pelvic mass that could be interfering with left leg venous drainage.  Continue treatment for cellulitis per primary service. 4.  Anemia: Possibly secondary to chronic disease and recent loss.  Supra therapeutic INR with [facility] earlier; slow downward trend of hemoglobin/hematocrit noted.  5.  History of mechanical mitral valve: On anticoagulation with warfarin 6.  History of primary biliary cirrhosis 7. Hyperkalemia: Corrected with Lokelma. HD today.   Subjective:   Reports to be feeling poorly this morning "aching all over and having strange dreams". No nausea/dysgeusia or limb jerking.    Objective:   BP 100/60   Pulse 89   Temp 97.9 F (36.6 C) (Oral)   Resp 15   Ht 5\' 2"  (1.575 m)   Wt 74.5 kg   SpO2 96%   BMI 30.04 kg/m   Intake/Output Summary (Last 24 hours) at 02/09/2019 0931 Last  data filed at 02/09/2019 0540 Gross per 24 hour  Intake -  Output 425 ml  Net -425 ml   Weight change: -0.9 kg  Physical Exam: Gen: Appears uncomfortable resting propped up in bed CVS: Pulse regular rhythm, normal rate, normal S1 and S2 Resp: Decreased breath sounds over both bases, no distinct rhonchi Abd: Soft, slightly distended, bowel sounds normal Ext: 2+ left leg edema, trace-1+ right leg edema  Imaging: No results found.  Labs: BMET Recent Labs  Lab 02/03/19 0323 02/04/19 0641 02/05/19 0819 02/06/19 0314 02/07/19 0352 02/08/19 0312 02/09/19 0403  NA 133* 133* 134* 130* 130* 128* 131*  K 4.3 4.4 5.0 5.0 5.3* 6.2* 4.2  CL 100 100 97* 97* 98 97* 97*  CO2 21* 21* 22 21* 20* 19* 19*  GLUCOSE 114* 108* 93 85 66* 73 67*  BUN 65* 79* 83* 85* 92* 98* 100*  CREATININE 1.66* 2.32* 2.71* 2.99* 3.21* 3.34* 3.60*  CALCIUM 8.4* 8.4* 8.4* 8.4* 8.4* 8.5* 8.6*  PHOS  --   --   --  3.0 3.8 4.6 5.0*   CBC Recent Labs  Lab 02/05/19 1630 02/06/19 0314  02/07/19 0352  02/08/19 0323 02/08/19 1037 02/08/19 1528 02/08/19 2144 02/09/19 0403  WBC 16.3* 17.0*  --  13.7*  --  12.3*  --   --   --  11.9*  NEUTROABS 15.2*  --   --   --   --   --   --   --   --   --   HGB 10.9* 10.3*   < > 10.1*   < > 9.0* 8.6* 8.5* 8.4*  8.3*  HCT 32.2* 29.4*   < > 28.6*   < > 26.4* 25.7* 24.5* 24.5* 24.5*  MCV 83.9 83.3  --  82.9  --  85.2  --   --   --  84.8  PLT 129* 136*  --  166  --  151  --   --   --  214   < > = values in this interval not displayed.    Medications:    . atorvastatin  40 mg Oral q1800  . feeding supplement (ENSURE ENLIVE)  237 mL Oral BID BM  . metoprolol succinate  12.5 mg Oral Daily  . pantoprazole (PROTONIX) IV  40 mg Intravenous Q12H  . ursodiol  300 mg Oral BID   Elmarie Shiley, MD 02/09/2019, 9:31 AM

## 2019-02-09 NOTE — Telephone Encounter (Signed)
Spoke to [place] at [facility] and she reports patient still in hospital, getting discharged sometime this weekend and they have orders from the hospital for PT -OT but patient has a wound and they will like to have nursing orders as well for this. Patient has an appointment here for hospital follow up on 02-13-2019. We can call with orders after her evaluation if provider agrees.

## 2019-02-09 NOTE — Progress Notes (Signed)
   Patient Status: [facility] - In-pt  Assessment and Plan: Patient in need of venous access for initiation of dialysis.  Patient with acute kidney injury.  To initiate dialysis today.   Spoke with daughter for consent.   Risks and benefits discussed with the patient including, but not limited to bleeding, infection, vascular injury, pneumothorax which may require chest tube placement, air embolism or even death  All of the patient's questions were answered, patient is agreeable to proceed. Consent signed and in chart.  ______________________________________________________________________   History of Present Illness: Marie Malone is a 68 y.o. female acute kidney injury, oliguria with worsening uremia and poor response to diuresis.  Request now made for temporary dialysis catheter placement.   Allergies and medications reviewed.   Review of Systems: A 12 point ROS discussed and pertinent positives are indicated in the HPI above.  All other systems are negative.  Review of Systems  Unable to perform ROS: Mental status change    Vital Signs: BP 100/60   Pulse 89   Temp 97.9 F (36.6 C) (Oral)   Resp 15   Ht 5\' 2"  (1.575 m)   Wt 164 lb 3.9 oz (74.5 kg)   SpO2 96%   BMI 30.04 kg/m   Physical Exam Vitals signs and nursing note reviewed.  Constitutional:      General: She is not in acute distress.    Appearance: She is well-developed. She is ill-appearing.  Cardiovascular:     Rate and Rhythm: Normal rate and regular rhythm.  Pulmonary:     Effort: Pulmonary effort is normal.     Breath sounds: Normal breath sounds.  Skin:    General: Skin is warm and dry.  Neurological:     Mental Status: She is disoriented.  Psychiatric:        Mood and Affect: Mood normal.        Behavior: Behavior normal.      Imaging reviewed.   Labs:  COAGS: Recent Labs    02/07/19 1547 02/08/19 0323 02/08/19 1800 02/09/19 0403  INR 3.7* 2.9* 3.1* 3.1*    BMP: Recent Labs     02/06/19 0314 02/07/19 0352 02/08/19 0312 02/09/19 0403  NA 130* 130* 128* 131*  K 5.0 5.3* 6.2* 4.2  CL 97* 98 97* 97*  CO2 21* 20* 19* 19*  GLUCOSE 85 66* 73 67*  BUN 85* 92* 98* 100*  CALCIUM 8.4* 8.4* 8.5* 8.6*  CREATININE 2.99* 3.21* 3.34* 3.60*  GFRNONAA 15* 14* 14* 12*  GFRAA 18* 16* 16* 14*       Electronically Signed: Docia Barrier, PA 02/09/2019, 12:47 PM   I spent a total of 15 minutes in face to face in clinical consultation, greater than 50% of which was counseling/coordinating care for venous access.

## 2019-02-09 NOTE — Telephone Encounter (Signed)
Lavella Lemons calling from [facility] stated that she would like verbals for evaluation and treatment

## 2019-02-09 NOTE — Procedures (Signed)
Interventional Radiology Procedure Note  Procedure: Placement of a right IJ approach temp HD catheter, 16cm.  Tip is positioned at the superior cavoatrial junction and catheter is ready for immediate use.  Complications: None Recommendations:  - Ok to use - Do not submerge - Routine line care   Signed,  Dulcy Fanny. Earleen Newport, DO

## 2019-02-09 NOTE — Progress Notes (Addendum)
Progress Note  Patient Name: Marie Malone Date of Encounter: 02/09/2019  Primary Cardiologist: [name], MD   Subjective   BRBPR overnight reported by the nurse. Patient denies chest pain. Drowsy on exam.   Inpatient Medications    Scheduled Meds: . atorvastatin  40 mg Oral q1800  . feeding supplement (ENSURE ENLIVE)  237 mL Oral BID BM  . metoprolol succinate  12.5 mg Oral Daily  . pantoprazole (PROTONIX) IV  40 mg Intravenous Q12H  . sodium zirconium cyclosilicate  10 g Oral TID  . ursodiol  300 mg Oral BID   Continuous Infusions: .  ceFAZolin (ANCEF) IV 2 g (02/09/19 0548)   PRN Meds: diphenhydrAMINE, docusate sodium, morphine injection, ondansetron **OR** ondansetron (ZOFRAN) IV, oxyCODONE **OR** oxyCODONE   Vital Signs    Vitals:   02/08/19 0500 02/08/19 1410 02/08/19 2045 02/09/19 0357  BP:  108/62 (!) 112/55 (!) 118/57  Pulse:  81 89 87  Resp:  17 12 15   Temp:  97.9 F (36.6 C) 97.9 F (36.6 C) 97.9 F (36.6 C)  TempSrc:  Oral Oral Oral  SpO2:  98% 92% 96%  Weight: 75.4 kg   74.5 kg  Height:        Intake/Output Summary (Last 24 hours) at 02/09/2019 0858 Last data filed at 02/09/2019 0540 Gross per 24 hour  Intake -  Output 425 ml  Net -425 ml   Last 3 Weights 02/09/2019 02/08/2019 02/07/2019  Weight (lbs) 164 lb 3.9 oz 166 lb 3.6 oz 170 lb 13.7 oz  Weight (kg) 74.5 kg 75.4 kg 77.5 kg      Telemetry    2:1 Aflutter, HR 80-90s, occasional PVCs - Personally Reviewed  ECG    No new - Personally Reviewed  Physical Exam   GEN: No acute distress; lethargic Neck: No JVD Cardiac: RRR, no murmurs, rubs, or gallops.  Respiratory: Clear to auscultation bilaterally. GI: Soft, nontender, non-distended  MS: No edema; No deformity. Neuro:  Nonfocal  Psych: Normal affect   Labs    High Sensitivity Troponin:   Recent Labs  Lab 01/24/19 0259 01/24/19 0738 01/24/19 0807 01/29/19 1635 01/29/19 1835  TROPONINIHS 132* 111* 113* 791* 718*      Chemistry Recent Labs  Lab 02/03/19 0323 02/04/19 0641 02/05/19 0819  02/07/19 0352 02/08/19 0312 02/09/19 0403  NA 133* 133* 134*   < > 130* 128* 131*  K 4.3 4.4 5.0   < > 5.3* 6.2* 4.2  CL 100 100 97*   < > 98 97* 97*  CO2 21* 21* 22   < > 20* 19* 19*  GLUCOSE 114* 108* 93   < > 66* 73 67*  BUN 65* 79* 83*   < > 92* 98* 100*  CREATININE 1.66* 2.32* 2.71*   < > 3.21* 3.34* 3.60*  CALCIUM 8.4* 8.4* 8.4*   < > 8.4* 8.5* 8.6*  PROT 8.2* 8.3* 8.7*  --   --   --   --   ALBUMIN 2.2* 2.1* 2.0*   < > 1.9* 2.1* 2.3*  AST 40 38 44*  --   --   --   --   ALT 10 6 <5  --   --   --   --   ALKPHOS 85 75 76  --   --   --   --   BILITOT 1.4* 1.4* 1.7*  --   --   --   --   GFRNONAA 32* 21* 17*   < >  14* 14* 12*  GFRAA 37* 24* 20*   < > 16* 16* 14*  ANIONGAP 12 12 15    < > 12 12 15    < > = values in this interval not displayed.     Hematology Recent Labs  Lab 02/07/19 0352  02/08/19 0323  02/08/19 1528 02/08/19 2144 02/09/19 0403  WBC 13.7*  --  12.3*  --   --   --  11.9*  RBC 3.45*  --  3.10*  --   --   --  2.89*  HGB 10.1*   < > 9.0*   < > 8.5* 8.4* 8.3*  HCT 28.6*   < > 26.4*   < > 24.5* 24.5* 24.5*  MCV 82.9  --  85.2  --   --   --  84.8  MCH 29.3  --  29.0  --   --   --  28.7  MCHC 35.3  --  34.1  --   --   --  33.9  RDW 17.5*  --  18.6*  --   --   --  18.1*  PLT 166  --  151  --   --   --  214   < > = values in this interval not displayed.    BNPNo results for input(s): BNP, PROBNP in the last 168 hours.   DDimer No results for input(s): DDIMER in the last 168 hours.   Radiology    No results found.  Cardiac Studies   Echo 01/24/19 1. Left ventricular ejection fraction, by visual estimation, is 45 to 50%. The left ventricle has mildly decreased function. Normal left ventricular size. Left ventricular septal wall thickness was normal. Normal left ventricular posterior wall  thickness. There is no left ventricular hypertrophy. 2. Indeterminate diastolic function  due to mechanical mitral valve pattern of LV diastolic filling. 3. Right ventricular pressure overload and right ventricular volume overload. 4. Global right ventricle has moderately reduced systolic function.The right ventricular size is moderately enlarged. No increase in right ventricular wall thickness. 5. Left atrial size was moderately dilated. 6. Right atrial size was severely dilated. 7. The mitral valve has been repaired/replaced. No evidence of mitral valve regurgitation. No evidence of mitral stenosis. 8. The tricuspid valve is normal in structure. Tricuspid valve regurgitation mild-moderate. 9. The aortic valve The aortic valve is normal in structure. Aortic valve regurgitation was not visualized by color flow Doppler. Structurally normal aortic valve, with no evidence of sclerosis or stenosis. 10. The pulmonic valve was normal in structure. Pulmonic valve regurgitation is mild by color flow Doppler. 11. Moderately elevated pulmonary artery systolic pressure. 12. The inferior vena cava is dilated in size with <50% respiratory variability, suggesting right atrial pressure of 15 mmHg.  Right and left cardiac catheterization 03/25/2016:   left ventricular systolic function is normal.  The left ventricular ejection fraction is 55-65% by visual estimate.  There is no aortic valve stenosis.  There is moderate mitral valve stenosis. Mean gradient 10 mm Hg. MV area 1.3 cm2  Mid Cx to Dist Cx lesion, 10 %stenosed.  Ost 1st Mrg to 1st Mrg lesion, 20 %stenosed.  Prox RCA lesion, 100 %stenosed. Left to right and right to right collaterals are present.  Hemodynamic findings consistent with moderate pulmonary hypertension and mitral valve stenosis.  LV end diastolic pressure is normal.  Patient Profile     68 y.o. female with a history of rheumatic fever/mitral valve stenosis s/p valvuloplasty at [facility] in 2006, CAD  s/p CABG, mechanical MVR, paroxysmal afib with MAZE procedure  in 2018 on coumadin, PAD s/p endarterectomy in 03/2016 who was admitted on 9/16 for acute heart failure in the setting of left leg cellulitis, complicated by aflutter, now found to have worsening AKI.  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 CXR with pulmonary edema and small right pleural effusion - Decompensation possibly from diet noncompliance and afib - Echo showed EF 45-50% with right ventricular dysfunction.  - Was on IV lasix but was stopped due to diarrhea on 9/18>> resumed on 9/25 for worsening LE edema and then stopped again 9/27for worsening creatinine and IVF bolus was given - Creatinine continues to worsen, 3.60 today - ARB d/c last week - USrenalnegative for obstruction. - CT abdomen and pelvis suggestive for anasarca - Per Nephrology albumin and Lasix yesterday. Volume status -425 mL yesterday.  Planning to start HD today  2:1 aflutter/Chronic afibwith h/o of maze procedure in 2018: -Patient was in afib on admission and was thought to have converted toNSR vs 2:1 aflutter vs 2:1 atach -After discussing with EP rhythm appears patient is in 2:1 Arley = 4 ( age, female, CHF, vascular dz) -Continue metoprolol for rate control -INR 3.1today -Had evidence of blood in stool overnight. Hgb 10.1 > 9.0 > 8.3. GI following - Pharmacy following >>change to heparin if INR drops below 2.5.  - Plan forTEE/DCCVonce patient is stable and bleeding resolved  LLE cellulitiswith swelling: -WBC13.7 > 12.3 > 11.9 -CTnegative for hematoma. Per Vascular more consistent with edema -Abx per ID  AKI - renal function continues to worsen 2.23 > 2.71 > 2.99 > 3.21 > 3.34 > 3.60 - US renal negative for obstruction - CT abd/pelvis was consistent with anasarca - Nephrology following >> planning for   Hyperkalemia - 6.2 > 4.2 patient was given lokelma - Patient might  need HD  Anemia - Hgb continues to drop 10.2 > 9.0 > 8.0. Baseline 10-11 - BRBPR overnight - GI following  - continue to trend CBC - INR therapeutic today, 3.1>> management per pharmacy - further management per GI/IM  Chest pain/H/o CAD s/p CABG - Hs troponin 791 > 718 and no EKG changes - Denies anginal symptoms - No further ischemic work-up at this time - Continue BB, ASA, and statin  Mechanical MVR/Rheumatic mitral valve disease - INR 3.1 today - Goal 2.5 - 3.5 - IV heparin per pharmacy if INR drops below 2.5   For questions or updates, please contact [facility] Please consult [email or web address] for contact info under        Signed, Cadence Ninfa Meeker, PA-C  02/09/2019, 8:58 AM    Patient seen and examined.  Agree with above documentation.  On exam, she is very somnolent but arousable, regular rate and rhythm no murmurs, 2+ bilateral lower extremity, positive JVD.  Continues to have some BRBPR.  INR 3.1 today.  Renal function continues to worsen, nephrology planning HD today.  GI recommending supportive care for GI bleeding.  Likely due to supratherapeutic INR expect to resolve with improvement in INR.  She remains in a rate controlled 2-1 atrial flutter.  We are planning for TEE/DCCV prior to discharge.  Will need TEE as was previously subtherapeutic on her INR.  Can likely plan for early next week if bleeding issues resolved.  Donato Heinz, MD

## 2019-02-10 LAB — CBC
HCT: 25.2 % — ABNORMAL LOW (ref 36.0–46.0)
Hemoglobin: 8.7 g/dL — ABNORMAL LOW (ref 12.0–15.0)
MCH: 28.9 pg (ref 26.0–34.0)
MCHC: 34.5 g/dL (ref 30.0–36.0)
MCV: 83.7 fL (ref 80.0–100.0)
Platelets: 212 10*3/uL (ref 150–400)
RBC: 3.01 MIL/uL — ABNORMAL LOW (ref 3.87–5.11)
RDW: 18.5 % — ABNORMAL HIGH (ref 11.5–15.5)
WBC: 10.4 10*3/uL (ref 4.0–10.5)
nRBC: 0.9 % — ABNORMAL HIGH (ref 0.0–0.2)

## 2019-02-10 LAB — RENAL FUNCTION PANEL
Albumin: 2.3 g/dL — ABNORMAL LOW (ref 3.5–5.0)
Anion gap: 14 (ref 5–15)
BUN: 66 mg/dL — ABNORMAL HIGH (ref 8–23)
CO2: 21 mmol/L — ABNORMAL LOW (ref 22–32)
Calcium: 8.4 mg/dL — ABNORMAL LOW (ref 8.9–10.3)
Chloride: 98 mmol/L (ref 98–111)
Creatinine, Ser: 2.87 mg/dL — ABNORMAL HIGH (ref 0.44–1.00)
GFR calc Af Amer: 19 mL/min — ABNORMAL LOW (ref >60)
GFR calc non Af Amer: 16 mL/min — ABNORMAL LOW (ref >60)
Glucose, Bld: 72 mg/dL (ref 70–99)
Phosphorus: 4.1 mg/dL (ref 2.5–4.6)
Potassium: 3.5 mmol/L (ref 3.5–5.1)
Sodium: 133 mmol/L — ABNORMAL LOW (ref 135–145)

## 2019-02-10 LAB — HEMOGLOBIN AND HEMATOCRIT, BLOOD
HCT: 25.6 % — ABNORMAL LOW (ref 36.0–46.0)
HCT: 25.3 % — ABNORMAL LOW (ref 36.0–46.0)
HCT: 25.2 % — ABNORMAL LOW (ref 36.0–46.0)
Hemoglobin: 8.7 g/dL — ABNORMAL LOW (ref 12.0–15.0)
Hemoglobin: 8.3 g/dL — ABNORMAL LOW (ref 12.0–15.0)
Hemoglobin: 8.5 g/dL — ABNORMAL LOW (ref 12.0–15.0)

## 2019-02-10 LAB — PROTIME-INR
INR: 2.7 — ABNORMAL HIGH (ref 0.8–1.2)
INR: 2.9 — ABNORMAL HIGH (ref 0.8–1.2)
Prothrombin Time: 28.6 seconds — ABNORMAL HIGH (ref 11.4–15.2)
Prothrombin Time: 29.6 seconds — ABNORMAL HIGH (ref 11.4–15.2)

## 2019-02-10 LAB — AMMONIA: Ammonia: 34 umol/L (ref 9–35)

## 2019-02-10 LAB — MAGNESIUM: Magnesium: 2.2 mg/dL (ref 1.7–2.4)

## 2019-02-10 MED ORDER — DEXTROSE-NACL 5-0.9 % IV SOLN
Freq: Once | INTRAVENOUS | Status: AC
  Administered 2019-02-10: 17:00:00 via INTRAVENOUS

## 2019-02-10 MED ORDER — ACETAMINOPHEN 325 MG PO TABS
650.0000 mg | ORAL_TABLET | Freq: Four times a day (QID) | ORAL | Status: DC | PRN
  Administered 2019-02-11 – 2019-02-17 (×4): 650 mg via ORAL
  Filled 2019-02-10 (×5): qty 2

## 2019-02-10 MED ORDER — MORPHINE SULFATE (PF) 2 MG/ML IV SOLN: 1.0000 mg | Freq: Three times a day (TID) | INTRAVENOUS | Status: DC | PRN

## 2019-02-10 MED ORDER — CHLORHEXIDINE GLUCONATE CLOTH 2 % EX PADS
6.0000 | MEDICATED_PAD | Freq: Every day | CUTANEOUS | Status: DC
  Administered 2019-02-10 – 2019-02-14 (×4): 6 via TOPICAL

## 2019-02-10 MED ORDER — PANTOPRAZOLE SODIUM 40 MG PO TBEC
40.0000 mg | DELAYED_RELEASE_TABLET | Freq: Every day | ORAL | Status: DC
  Administered 2019-02-11 – 2019-02-15 (×5): 40 mg via ORAL
  Filled 2019-02-10 (×5): qty 1

## 2019-02-10 NOTE — Progress Notes (Signed)
Progress Note  Patient Name: Marie Malone Date of Encounter: 02/10/2019  Primary Cardiologist: [name], MD  Subjective   Somewhat drowsy this morning, complains of abdominal discomfort.  No chest pain or palpitations.  Inpatient Medications    Scheduled Meds: . atorvastatin  40 mg Oral q1800  . feeding supplement (ENSURE ENLIVE)  237 mL Oral BID BM  . heparin      . metoprolol succinate  12.5 mg Oral Daily  . pantoprazole (PROTONIX) IV  40 mg Intravenous Q12H  . ursodiol  300 mg Oral BID    PRN Meds: acetaminophen, diphenhydrAMINE, docusate sodium, lidocaine (PF), morphine injection, ondansetron **OR** ondansetron (ZOFRAN) IV, oxyCODONE **OR** oxyCODONE   Vital Signs    Vitals:   02/09/19 2200 02/09/19 2209 02/10/19 0652 02/10/19 0700  BP: (!) 111/56 121/60 113/62   Pulse: 82 81 87   Resp:  16 15   Temp:  97.6 F (36.4 C) 97.7 F (36.5 C)   TempSrc:  Axillary Oral   SpO2:  99% 97%   Weight:  78 kg  70.9 kg  Height:        Intake/Output Summary (Last 24 hours) at 02/10/2019 0944 Last data filed at 02/09/2019 2209 Gross per 24 hour  Intake 178 ml  Output 500 ml  Net -322 ml   Filed Weights   02/09/19 1922 02/09/19 2209 02/10/19 0700  Weight: 78.8 kg 78 kg 70.9 kg    Telemetry    Atrial flutter with variable conduction.  Personally reviewed.  ECG    An ECG dated 02/09/2019 was personally reviewed today and demonstrated:  Atypical atrial flutter with variable conduction.  Physical Exam   GEN:  Elderly woman.  No acute distress.   Neck: No JVD. Cardiac:  Irregular, no gallop.  Respiratory: Nonlabored. Clear to auscultation bilaterally. GI: Soft, nontender, bowel sounds present. MS: No edema; No deformity.  Labs    Chemistry Recent Labs  Lab 02/04/19 [phone] 02/05/19 0819  02/08/19 0312 02/09/19 0403 02/10/19 0332  NA 133* 134*   < > 128* 131* 133*  K 4.4 5.0   < > 6.2* 4.2 3.5  CL 100 97*   < > 97* 97* 98  CO2 21* 22   < > 19* 19*  21*  GLUCOSE 108* 93   < > 73 67* 72  BUN 79* 83*   < > 98* 100* 66*  CREATININE 2.32* 2.71*   < > 3.34* 3.60* 2.87*  CALCIUM 8.4* 8.4*   < > 8.5* 8.6* 8.4*  PROT 8.3* 8.7*  --   --   --   --   ALBUMIN 2.1* 2.0*   < > 2.1* 2.3* 2.3*  AST 38 44*  --   --   --   --   ALT 6 <5  --   --   --   --   ALKPHOS 75 76  --   --   --   --   BILITOT 1.4* 1.7*  --   --   --   --   GFRNONAA 21* 17*   < > 14* 12* 16*  GFRAA 24* 20*   < > 16* 14* 19*  ANIONGAP 12 15   < > _0 < > = values in this interval not displayed.     Hematology Recent Labs  Lab 02/08/19 0323  02/09/19 0403  02/09/19 1611 02/09/19 2302 02/10/19 0332  WBC 12.3*  --  11.9*  --   --   --  10.4  RBC 3.10*  --  2.89*  --   --   --  3.01*  HGB 9.0*   < > 8.3*   < > 8.7* 8.9* 8.7*  HCT 26.4*   < > 24.5*   < > 24.8* 26.0* 25.2*  MCV 85.2  --  84.8  --   --   --  83.7  MCH 29.0  --  28.7  --   --   --  28.9  MCHC 34.1  --  33.9  --   --   --  34.5  RDW 18.6*  --  18.1*  --   --   --  18.5*  PLT 151  --  214  --   --   --  212   < > = values in this interval not displayed.    Cardiac Enzymes Recent Labs  Lab 01/24/19 0259 01/24/19 0738 01/24/19 0807 01/29/19 1635 01/29/19 1835  TROPONINIHS 132* 111* 113* 791* 718*    Radiology    Ir Fluoro Guide Cv Line Right  Result Date: 02/09/2019 INDICATION: 68 year old female with a history of renal failure EXAM: TEMPORARY HEMODIALYSIS CATHETER WITH IMAGE GUIDANCE MEDICATIONS: None ANESTHESIA/SEDATION: None FLUOROSCOPY TIME:  Fluoroscopy Time: 0 minutes 6 seconds (1 mGy). COMPLICATIONS: None PROCEDURE: Informed written consent was obtained from the patient's family after a discussion of the risks, benefits, and alternatives to treatment. Questions regarding the procedure were encouraged and answered. The right neck was prepped with chlorhexidine in a sterile fashion, and a sterile drape was applied covering the operative field. Maximum barrier sterile technique with sterile  gowns and gloves were used for the procedure. A timeout was performed prior to the initiation of the procedure. A micropuncture kit was utilized to access the right internal jugular vein under direct, real-time ultrasound guidance after the overlying soft tissues were anesthetized with 1% lidocaine with epinephrine. Ultrasound image documentation was performed. The microwire was kinked to measure appropriate catheter length. A stiff glidewire was advanced to the level of the IVC. A 16 cm hemodialysis catheter was then placed over the wire. Final catheter positioning was confirmed and documented with a spot radiographic image. The catheter aspirates and flushes normally. The catheter was flushed with appropriate volume heparin dwells. Dressings were applied. The patient tolerated the procedure well without immediate post procedural complication. IMPRESSION: Status post image guided temporary hemodialysis catheter placement. Signed, Dulcy Fanny. Dellia Nims, RPVI [facility] Radiology Electronically Signed   By: Corrie Mckusick D.O.   On: 02/09/2019 13:34   Ir US Guide Vasc Access Right  Result Date: 02/09/2019 INDICATION: 68 year old female with a history of renal failure EXAM: TEMPORARY HEMODIALYSIS CATHETER WITH IMAGE GUIDANCE MEDICATIONS: None ANESTHESIA/SEDATION: None FLUOROSCOPY TIME:  Fluoroscopy Time: 0 minutes 6 seconds (1 mGy). COMPLICATIONS: None PROCEDURE: Informed written consent was obtained from the patient's family after a discussion of the risks, benefits, and alternatives to treatment. Questions regarding the procedure were encouraged and answered. The right neck was prepped with chlorhexidine in a sterile fashion, and a sterile drape was applied covering the operative field. Maximum barrier sterile technique with sterile gowns and gloves were used for the procedure. A timeout was performed prior to the initiation of the procedure. A micropuncture kit was  utilized to access the right internal jugular vein under direct, real-time ultrasound guidance after the overlying soft tissues were anesthetized with 1% lidocaine with epinephrine. Ultrasound image documentation was performed. The microwire was kinked to measure appropriate  catheter length. A stiff glidewire was advanced to the level of the IVC. A 16 cm hemodialysis catheter was then placed over the wire. Final catheter positioning was confirmed and documented with a spot radiographic image. The catheter aspirates and flushes normally. The catheter was flushed with appropriate volume heparin dwells. Dressings were applied. The patient tolerated the procedure well without immediate post procedural complication. IMPRESSION: Status post image guided temporary hemodialysis catheter placement. Signed, Dulcy Fanny. Dellia Nims, RPVI [facility] Radiology Electronically Signed   By: Corrie Mckusick D.O.   On: 02/09/2019 13:34    Cardiac Studies   Echocardiogram 01/24/2019:  1. Left ventricular ejection fraction, by visual estimation, is 45 to 50%. The left ventricle has mildly decreased function. Normal left ventricular size. Left ventricular septal wall thickness was normal. Normal left ventricular posterior wall  thickness. There is no left ventricular hypertrophy.  2. Indeterminate diastolic function due to mechanical mitral valve pattern of LV diastolic filling.  3. Right ventricular pressure overload and right ventricular volume overload.  4. Global right ventricle has moderately reduced systolic function.The right ventricular size is moderately enlarged. No increase in right ventricular wall thickness.  5. Left atrial size was moderately dilated.  6. Right atrial size was severely dilated.  7. The mitral valve has been repaired/replaced. No evidence of mitral valve regurgitation. No evidence of mitral stenosis.  8. The tricuspid valve is normal in structure.  Tricuspid valve regurgitation mild-moderate.  9. The aortic valve The aortic valve is normal in structure. Aortic valve regurgitation was not visualized by color flow Doppler. Structurally normal aortic valve, with no evidence of sclerosis or stenosis. 10. The pulmonic valve was normal in structure. Pulmonic valve regurgitation is mild by color flow Doppler. 11. Moderately elevated pulmonary artery systolic pressure. 12. The inferior vena cava is dilated in size with <50% respiratory variability, suggesting right atrial pressure of 15 mmHg.  Patient Profile     68 y.o. female with a history of rheumatic fever/mitral valve stenosis s/p valvuloplasty at [facility] in 2006, CAD s/p CABG, mechanical MVR, paroxysmal afib with MAZE procedure in 2018 on coumadin, PAD s/p endarterectomy in 03/2016 who was admitted on 9/16 for acute heart failurein the setting of left leg cellulitis,complicated by aflutter, now found to have worsening AKI.  Assessment & Plan    1.  Atypical atrial flutter with variable conduction, history of atrial fibrillation with previous MAZE procedure in 2018.  CHADSVASC score is 4.  She continues on metoprolol for heart rate control, EP has also evaluated.  Depending on clinical progress may ultimately be considered for a TEE guided cardioversion.  Coumadin currently on hold with heparin bridge and follow-up by pharmacy.  2.  Acute on chronic combined heart failure.  LVEF 45 to 50% range.  IV Lasix discontinued in the setting of acute kidney injury and recurring diarrhea.  3.  Acute kidney injury, oliguric with follow-up by nephrology.  Right IJ dialysis catheter placed for temporary course of hemodialysis.  4.  Acute on chronic anemia with intermittent hematochezia.  She has had follow-up by GI.  Notes reviewed.  5.  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  Coumadin on hold with heparin bridge at present and followed by pharmacy.  Most recent INR 2.7.  Present  cardiac regimen includes Toprol-XL and Lipitor.  Heart rate control is adequate at this time in atypical atrial flutter.  She is on heparin bridge with Coumadin on hold and follow-up by  pharmacy.  Hopefully with course of temporary hemodialysis volume status will also improve, Lasix has been on hold.  Signed, Rozann Lesches, MD  02/10/2019, 9:44 AM

## 2019-02-10 NOTE — Progress Notes (Signed)
[name]  HQI:[id number] DOB: 06/11/1950 DOA: 01/28/2019 PCP: Debbrah Alar, NP   Brief Narrative:   Marie Golladay Lipfordis a 68 y.o. 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 Patient states over the last 2 weeks patient has been getting progressively short of breath on exertion. Denies any chest pain fever chills or productive cough. Patient's primary care physician advised her to increase her Lasix twice daily which she is supposed to start today. Because of worsening shortness of breath patient came to the ER. EMS was called and patient initially was placed on BiPAP. Patient also had complained of some left calf cramping.  10/1: More interactive today. K+ up ON. 10/2: A little tired today.  10/3: Will wake and interactive with effort. Says legs feel better, but hurts elsewhere. Vague.     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   S/P CABG x 1   Acute on chronic systolic CHF (congestive heart failure) (HCC)   Atrial fibrillation, chronic (HCC)   Cellulitis of left leg   Acute renal failure (HCC)   Primary biliary cirrhosis (HCC)   Chest pain at rest   H/O mitral valve replacement with mechanical valve   Atypical atrial flutter (HCC)   AKI (acute kidney injury) ([place])   Hematochezia   Supratherapeutic INR   LLE cellulitis - Bld Cx, UCx NGTD  - WBC improved to 13.7, afebrile - vanc/ceftriaxone - narrowed to ancef per ID - ID c/s due to slow to improve ->improved on ancef - recommending keflex at discharge (9/25) -Patient also  with loose stools (possibly from Ursodiol) however patient has been chronically on this medication. - lactoferrin negative and c diff negative - PT rec SNF - continue ancef - leg pain is improving; able to manipulate her leg more today     - stop abx today and monitor     - afebrile, WBC normal, abx completed; leg is better  LLE Swelling - suspect this is due to above - elevate LE above heart, continue to monitor - given significant LLE edema, more than expected, CT abdomen/pelvis without contrast obtained on 9/28 to eval for mass affecting venous return - notable for cirrhosis, anasarca - see 9/28 CT report - some concern for posterior knee bleed vs compartment syndrome on 9/22 ->but low suspicion for this based on imaging and exam with soft compartments - CT was not suggestive of hematoma, but did have marked skin thickening and subcutaneous edema - LLE [place] from 9/22 read an notable for "significant fluid in posterior calf, unclear etiology" - discussed this with vascular to ensure this did not appear to be fluid collection and he noted appeared c/w edema -pt with good distal pulses, will hold off on vascular c/s - see above  Acute renal failure (baseline Cr 1.0) NGMA Hyponatremia Hyperkalemia - SCr 2.87today; slightly up - nephro onboard, appreciate assistance - K+ resolved     - HD started yesterday for uremic symptoms  Acute respiratory failure with hypoxia - resolved  Chest pain acute started 9/21 Elevated Troponin -Patient borderline hypotensive with MAP 60-63 -EKG: atrial flutter,  nonspecific ST-T wave changes no changes from previous EKG -Troponin high-sensitivity 791 -> 718 - cards following and aware - no plans for additional ischemic workup at this point  Acute on chronic systolic CHF - follow I&O and daily wts  - EF 40-45% - cards consulted - metoprolol XL 12.13m    Mechanical mitral valve -Continue warfarin, monitor INR - Therapeutic INR goal 2.5-3.5 - Supratherapeutic; repeat INR this afternoon; may need more Vit K per GI - INR is 2.7 today  Atrial fibrillation chronic -Currently NSR -Anticoagulated for her mechanical mitral valve - Consider DCCV prior to d/c (vs TEE/DCCV if pt INR becomes subtherapeutic)  - per cards, going to discuss with EP given suspicion for a-tach      - per cards: Depending on clinical progress may ultimately be considered for a TEE guided cardioversion  Primary biliary cirrhosis(PBC) -Ursodial 300 mg BID.  - benadryl for itching  Diarrhea -9/22 patient reports diarrhea to cardiology - Fecal fat wnl - GI pathogen panel by PCR negative  - Lactoferrin negative     - resolved  Anemia - Hbg stable; monitor - INR 2.7 today; Hgb is stable today; GI onboard, conservative management for now  Hypotension - resolved  Ammonia level is normal. Drowsy again today. Wonder if glucose level is also playing a role. Encourage diet. Add a little D5 today. Updated daughter by phone.    DVT prophylaxis: INR in therapeutic range Code Status: FULL   Disposition Plan: TBD  Consultants:   Cardiology  GI  Nephrology  ROS:  Reports a general pain. Denies dyspnea, N, V. Remainder 10-pt ROS is negative for all not previously mentioned.  Subjective: "It just hurts."  Objective: Vitals:   02/10/19 0652 02/10/19 0700 02/10/19 0944 02/10/19 1300  BP: 113/62  (!) 111/54   Pulse: 87  85 91  Resp: 15   17  Temp: 97.7 F (36.5 C)   97.9 F (36.6 C)  TempSrc: Oral   Oral  SpO2: 97%   98%  Weight:  70.9 kg    Height:        Intake/Output Summary (Last 24 hours) at 02/10/2019 1552 Last data filed at 02/10/2019 0900 Gross per 24 hour  Intake 180 ml  Output 500 ml  Net -320 ml   Filed Weights   02/09/19 1922 02/09/19 2209 02/10/19 0700   Weight: 78.8 kg 78 kg 70.9 kg    Examination:  General:68 y.o.femaleresting in bed in NAD Eyes: PERRL, normal sclera Cardiovascular: RRR, +S1, S2, no m/g/r, equal pulses throughout Respiratory: CTABL, no w/r/r, normal WOB GI: BS+, NDNT, no masses noted, no organomegaly noted MSK: No c/c; LLE edema improved; no grimace when I work with her leg Skin: No rashes, bruises, ulcerations noted Neuro:will open eyes, interacts and follow commands appropriately, but she is slow about it   Data Reviewed: I have personally reviewed following labs and imaging studies.  CBC: Recent Labs  Lab 02/05/19 1630 02/06/19 0314  02/07/19 0352  02/08/19 0323  02/09/19 0403  02/09/19 1611 02/09/19 2302 02/10/19 0332 02/10/19 0939 02/10/19 1124  WBC 16.3* 17.0*  --  13.7*  --  12.3*  --  11.9*  --   --   --  10.4  --   --   NEUTROABS 15.2*  --   --   --   --   --   --   --   --   --   --   --   --   --  HGB 10.9* 10.3*   < > 10.1*   < > 9.0*   < > 8.3*   < > 8.7* 8.9* 8.7* 8.7* 8.3*  HCT 32.2* 29.4*   < > 28.6*   < > 26.4*   < > 24.5*   < > 24.8* 26.0* 25.2* 25.6* 25.3*  MCV 83.9 83.3  --  82.9  --  85.2  --  84.8  --   --   --  83.7  --   --   PLT 129* 136*  --  166  --  151  --  214  --   --   --  212  --   --    < > = values in this interval not displayed.   Basic Metabolic Panel: Recent Labs  Lab 02/05/19 0819 02/06/19 0314 02/07/19 0352 02/08/19 0312 02/09/19 0403 02/10/19 0332  NA 134* 130* 130* 128* 131* 133*  K 5.0 5.0 5.3* 6.2* 4.2 3.5  CL 97* 97* 98 97* 97* 98  CO2 22 21* 20* 19* 19* 21*  GLUCOSE 93 85 66* 73 67* 72  BUN 83* 85* 92* 98* 100* 66*  CREATININE 2.71* 2.99* 3.21* 3.34* 3.60* 2.87*  CALCIUM 8.4* 8.4* 8.4* 8.5* 8.6* 8.4*  MG 2.3  --  2.4 2.4 2.3 2.2  PHOS  --  3.0 3.8 4.6 5.0* 4.1   GFR: Estimated Creatinine Clearance: 17.5 mL/min (A) (by C-G formula based on SCr of 2.87 mg/dL (H)). Liver Function Tests: Recent Labs  Lab 02/04/19 0641 02/05/19 0819  02/06/19 0314 02/07/19 0352 02/08/19 0312 02/09/19 0403 02/10/19 0332  AST 38 44*  --   --   --   --   --   ALT 6 <5  --   --   --   --   --   ALKPHOS 75 76  --   --   --   --   --   BILITOT 1.4* 1.7*  --   --   --   --   --   PROT 8.3* 8.7*  --   --   --   --   --   ALBUMIN 2.1* 2.0* 2.0* 1.9* 2.1* 2.3* 2.3*   No results for input(s): LIPASE, AMYLASE in the last 168 hours. Recent Labs  Lab 02/10/19 1124  AMMONIA 34   Coagulation Profile: Recent Labs  Lab 02/07/19 1547 02/08/19 0323 02/08/19 1800 02/09/19 0403 02/10/19 0332  INR 3.7* 2.9* 3.1* 3.1* 2.7*   Cardiac Enzymes: No results for input(s): CKTOTAL, CKMB, CKMBINDEX, TROPONINI in the last 168 hours. BNP (last 3 results) No results for input(s): PROBNP in the last 8760 hours. HbA1C: No results for input(s): HGBA1C in the last 72 hours. CBG: No results for input(s): GLUCAP in the last 168 hours. Lipid Profile: No results for input(s): CHOL, HDL, LDLCALC, TRIG, CHOLHDL, LDLDIRECT in the last 72 hours. Thyroid Function Tests: No results for input(s): TSH, T4TOTAL, FREET4, T3FREE, THYROIDAB in the last 72 hours. Anemia Panel: No results for input(s): VITAMINB12, FOLATE, FERRITIN, TIBC, IRON, RETICCTPCT in the last 72 hours. Sepsis Labs: No results for input(s): PROCALCITON, LATICACIDVEN in the last 168 hours.  Recent Results (from the past 240 hour(s))  Culture, blood (routine x 2)     Status: None   Collection Time: 02/01/19  8:50 AM   Specimen: BLOOD  Result Value Ref Range Status   Specimen Description BLOOD LEFT ANTECUBITAL  Final   Special Requests   Final  BOTTLES DRAWN AEROBIC AND ANAEROBIC Blood Culture adequate volume   Culture   Final    NO GROWTH 5 DAYS Performed at [facility], [place] [address] [place], [place] [address]    Report Status 02/06/2019 FINAL  Final  Culture, blood (routine x 2)     Status: None   Collection Time: 02/01/19  8:55 AM   Specimen: BLOOD RIGHT HAND  Result Value  Ref Range Status   Specimen Description BLOOD RIGHT HAND  Final   Special Requests   Final    BOTTLES DRAWN AEROBIC AND ANAEROBIC Blood Culture adequate volume   Culture   Final    NO GROWTH 5 DAYS Performed at [facility], [place] [address] [place], [place] [address]    Report Status 02/06/2019 FINAL  Final      Radiology Studies: Ir Fluoro Guide Cv Line Right  Result Date: 02/09/2019 INDICATION: 68 year old female with a history of renal failure EXAM: TEMPORARY HEMODIALYSIS CATHETER WITH IMAGE GUIDANCE MEDICATIONS: None ANESTHESIA/SEDATION: None FLUOROSCOPY TIME:  Fluoroscopy Time: 0 minutes 6 seconds (1 mGy). COMPLICATIONS: None PROCEDURE: Informed written consent was obtained from the patient's family after a discussion of the risks, benefits, and alternatives to treatment. Questions regarding the procedure were encouraged and answered. The right neck was prepped with chlorhexidine in a sterile fashion, and a sterile drape was applied covering the operative field. Maximum barrier sterile technique with sterile gowns and gloves were used for the procedure. A timeout was performed prior to the initiation of the procedure. A micropuncture kit was utilized to access the right internal jugular vein under direct, real-time ultrasound guidance after the overlying soft tissues were anesthetized with 1% lidocaine with epinephrine. Ultrasound image documentation was performed. The microwire was kinked to measure appropriate catheter length. A Malone glidewire was advanced to the level of the IVC. A 16 cm hemodialysis catheter was then placed over the wire. Final catheter positioning was confirmed and documented with a spot radiographic image. The catheter aspirates and flushes normally. The catheter was flushed with appropriate volume heparin dwells. Dressings were applied. The patient tolerated the procedure well without immediate post procedural complication. IMPRESSION: Status post image guided  temporary hemodialysis catheter placement. Signed, Dulcy Fanny. Dellia Nims, RPVI [facility] Radiology Electronically Signed   By: Corrie Mckusick D.O.   On: 02/09/2019 13:34   Ir US Guide Vasc Access Right  Result Date: 02/09/2019 INDICATION: 68 year old female with a history of renal failure EXAM: TEMPORARY HEMODIALYSIS CATHETER WITH IMAGE GUIDANCE MEDICATIONS: None ANESTHESIA/SEDATION: None FLUOROSCOPY TIME:  Fluoroscopy Time: 0 minutes 6 seconds (1 mGy). COMPLICATIONS: None PROCEDURE: Informed written consent was obtained from the patient's family after a discussion of the risks, benefits, and alternatives to treatment. Questions regarding the procedure were encouraged and answered. The right neck was prepped with chlorhexidine in a sterile fashion, and a sterile drape was applied covering the operative field. Maximum barrier sterile technique with sterile gowns and gloves were used for the procedure. A timeout was performed prior to the initiation of the procedure. A micropuncture kit was utilized to access the right internal jugular vein under direct, real-time ultrasound guidance after the overlying soft tissues were anesthetized with 1% lidocaine with epinephrine. Ultrasound image documentation was performed. The microwire was kinked to measure appropriate catheter length. A Malone glidewire was advanced to the level of the IVC. A 16 cm hemodialysis catheter was then placed over the wire. Final catheter positioning was confirmed and documented  with a spot radiographic image. The catheter aspirates and flushes normally. The catheter was flushed with appropriate volume heparin dwells. Dressings were applied. The patient tolerated the procedure well without immediate post procedural complication. IMPRESSION: Status post image guided temporary hemodialysis catheter placement. Signed, Dulcy Fanny. Dellia Nims, RPVI [facility]  [facility] Radiology Electronically Signed   By: Corrie Mckusick D.O.   On: 02/09/2019 13:34     Scheduled Meds:  atorvastatin  40 mg Oral q1800   Chlorhexidine Gluconate Cloth  6 each Topical Q0600   feeding supplement (ENSURE ENLIVE)  237 mL Oral BID BM   metoprolol succinate  12.5 mg Oral Daily   pantoprazole  40 mg Oral Daily   ursodiol  300 mg Oral BID   Continuous Infusions:   LOS: 17 days    Time spent: 25 minutes spent in the coordination of care today.    Jonnie Finner, DO [facility] Pager [phone]  If 7PM-7AM, please contact night-coverage [email or web address] Password [email or web address] 02/10/2019, 3:52 PM

## 2019-02-10 NOTE — Progress Notes (Signed)
ANTICOAGULATION CONSULT NOTE  Pharmacy Consult for warfarin to heparin Indication: atrial fibrillation and mechanical MVR  Vital Signs: Temp: 97.7 F (36.5 C) (10/03 0652) Temp Source: Oral (10/03 0652) BP: 113/62 (10/03 0652) Pulse Rate: 87 (10/03 0652)  Labs: Recent Labs    02/08/19 0312 02/08/19 0323  02/08/19 1800  02/09/19 0403  02/09/19 1611 02/09/19 2302 02/10/19 0332  HGB  --  9.0*   < >  --    < > 8.3*   < > 8.7* 8.9* 8.7*  HCT  --  26.4*   < >  --    < > 24.5*   < > 24.8* 26.0* 25.2*  PLT  --  151  --   --   --  214  --   --   --  212  LABPROT  --  29.5*  --  31.7*  --  31.7*  --   --   --  28.6*  INR  --  2.9*  --  3.1*  --  3.1*  --   --   --  2.7*  CREATININE 3.34*  --   --   --   --  3.60*  --   --   --  2.87*   < > = values in this interval not displayed.    Estimated Creatinine Clearance: 17.5 mL/min (A) (by C-G formula based on SCr of 2.87 mg/dL (H)).  Assessment: 68 y.o. female admitted with CHF/SOB, h/o Afib and mechanical MVR, to continue warfarin. PTA regimen is 4 mg daily, noted prior goal 2-3 has been adjusted to 2.5-3.5 this admit by cardiology. INR has been elevated likely due to vitamin K deficiency. Vitamin K given 9/22 and again 9/29 and 9/30.  INR this morning remains therapeutic at 2.7. Warfarin on hold, will begin IV heparin once INR < 2.5 to cover mechanical valve. CBC stable.  Goal of Therapy:  INR 2.5-3.5 Monitor platelets by anticoagulation protocol: Yes    Plan:  -Hold warfarin -1800 INR - heparin if INR < 2.5 -Daily INR      Thank you for allowing pharmacy to participate in this patient's care.  Dathan Attia L. Devin Going, PharmD, [place] PGY1 Pharmacy Resident 02/10/19      9:21 AM  Please check AMION for all [place] phone numbers After 10:00 PM, call the [place] [phone]

## 2019-02-10 NOTE — Progress Notes (Signed)
   Patient Name: Teneisha Gignac Brickel Date of Encounter: 02/10/2019, 10:37 AM    Subjective  Sleepy, vague c/o pain   Objective  BP (!) 111/54   Pulse 85   Temp 97.7 F (36.5 C) (Oral)   Resp 15   Ht 5\' 2"  (1.575 m)   Wt 70.9 kg   SpO2 97%   BMI 28.59 kg/m  Somnolent but arousable abd soft     CBC Latest Ref Rng & Units 02/10/2019 02/10/2019 02/09/2019  WBC 4.0 - 10.5 K/uL - 10.4 -  Hemoglobin 12.0 - 15.0 g/dL 8.7(L) 8.7(L) 8.9(L)  Hematocrit 36.0 - 46.0 % 25.6(L) 25.2(L) 26.0(L)  Platelets 150 - 400 K/uL - 212 -   Lab Results  Component Value Date   CREATININE 2.87 (H) 02/10/2019   BUN 66 (H) 02/10/2019   NA 133 (L) 02/10/2019   K 3.5 02/10/2019   CL 98 02/10/2019   CO2 21 (L) 02/10/2019    Lab Results  Component Value Date   INR 2.7 (H) 02/10/2019   INR 3.1 (H) 02/09/2019   INR 3.1 (H) 02/08/2019   PROTIME 21.1 (A) 06/04/2016   PROTIME 24.6 (A) 06/03/2016   PROTIME 28.8 (A) 06/02/2016     Assessment and Plan  Hematochezia - Hgb stable - no reports of bleeding in chart and she cannot tell me Primary Biliary Cholangitis w/o cirrhosis as best we know Somnolence - lethargy - cause not clear but multiple options ? Hepatic encephalopathy - possible  Will check an Ammonia level though cannot hang our hat on that No plans for an endoscopic evaluation  Gatha Mayer, MD, [facility]astroenterology 02/10/2019 10:37 AM Pager [phone]

## 2019-02-10 NOTE — Progress Notes (Signed)
Patient ID: Marie Malone, female   DOB: [birth year], 68 y.o.   MRN: [id number] [facility] Progress Note   Assessment/ Plan:   1.  Acute kidney injury:  Some urine output overnight-remains oliguric.  Differential diagnosis include ATN and acute interstitial nephritis (AIN) with available labs favoring hemodynamically mediated AKI/ATN.  Renal ultrasound negative for any obstruction and abdominal/pelvic imaging showing ascites/abdominal wall edema.  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  Because of subtle uremic symptoms, patient started on dialysis yesterday.  We will continue to monitor urine output/labs for additional dialysis needs.  2.  Hypoxic respiratory failure: Secondary to acute exacerbation of diastolic heart failure in this patient with pulmonary hypertension.  Status post hemodialysis yesterday with some ultrafiltration. 3.  Left leg cellulitis:  Status post completion of antibiotic course and CT scan of the abdomen/pelvis did not show any space-occupying lesion that could limit venous return. 4.  Anemia: Possibly secondary to chronic disease and recent loss.    Hematochezia appears to have resolved and hemoglobin/hematocrit appear stable.  5.  History of mechanical mitral valve: On anticoagulation with warfarin 6.  History of primary biliary cirrhosis  Subjective:   Reports to be feeling poorly this morning "aching all over and having strange dreams". No nausea/dysgeusia or limb jerking.    Objective:   BP (!) 111/54   Pulse 85   Temp 97.7 F (36.5 C) (Oral)   Resp 15   Ht _0  (1.575 m)   Wt 70.9 kg   SpO2 97%   BMI 28.59 kg/m   Intake/Output Summary (Last 24 hours) at 02/10/2019 1148 Last data filed at 02/09/2019 2209 Gross per 24 hour  Intake 60 ml  Output 500 ml  Net -440 ml   Weight change: 4.3 kg  Physical  Exam: Gen: Somnolent resting in bed, awakens to calling out her name.  Minimal verbal responses. CVS: Pulse regular rhythm, normal rate, normal S1 and S2 Resp: Decreased breath sounds over both bases, no distinct rhonchi Abd: Soft, slightly distended, bowel sounds normal Ext: 2+ left leg edema, trace-1+ right leg edema  Imaging: Ir Fluoro Guide Cv Line Right  Result Date: 02/09/2019 INDICATION: 68 year old female with a history of renal failure EXAM: TEMPORARY HEMODIALYSIS CATHETER WITH IMAGE GUIDANCE MEDICATIONS: None ANESTHESIA/SEDATION: None FLUOROSCOPY TIME:  Fluoroscopy Time: 0 minutes 6 seconds (1 mGy). COMPLICATIONS: None PROCEDURE: Informed written consent was obtained from the patient's family after a discussion of the risks, benefits, and alternatives to treatment. Questions regarding the procedure were encouraged and answered. The right neck was prepped with chlorhexidine in a sterile fashion, and a sterile drape was applied covering the operative field. Maximum barrier sterile technique with sterile gowns and gloves were used for the procedure. A timeout was performed prior to the initiation of the procedure. A micropuncture kit was utilized to access the right internal jugular vein under direct, real-time ultrasound guidance after the overlying soft tissues were anesthetized with 1% lidocaine with epinephrine. Ultrasound image documentation was performed. The microwire was kinked to measure appropriate catheter length. A stiff glidewire was advanced to the level of the IVC. A 16 cm hemodialysis catheter was then placed over the wire. Final catheter positioning was confirmed and documented with a spot radiographic image. The catheter aspirates and flushes normally. The catheter was flushed with appropriate volume heparin dwells. Dressings were applied. The patient tolerated the procedure well without  immediate post procedural complication. IMPRESSION: Status post image guided temporary  hemodialysis catheter placement. Signed, Dulcy Fanny. Dellia Nims, RPVI [facility] Radiology Electronically Signed   By: Corrie Mckusick D.O.   On: 02/09/2019 13:34   Ir US Guide Vasc Access Right  Result Date: 02/09/2019 INDICATION: 68 year old female with a history of renal failure EXAM: TEMPORARY HEMODIALYSIS CATHETER WITH IMAGE GUIDANCE MEDICATIONS: None ANESTHESIA/SEDATION: None FLUOROSCOPY TIME:  Fluoroscopy Time: 0 minutes 6 seconds (1 mGy). COMPLICATIONS: None PROCEDURE: Informed written consent was obtained from the patient's family after a discussion of the risks, benefits, and alternatives to treatment. Questions regarding the procedure were encouraged and answered. The right neck was prepped with chlorhexidine in a sterile fashion, and a sterile drape was applied covering the operative field. Maximum barrier sterile technique with sterile gowns and gloves were used for the procedure. A timeout was performed prior to the initiation of the procedure. A micropuncture kit was utilized to access the right internal jugular vein under direct, real-time ultrasound guidance after the overlying soft tissues were anesthetized with 1% lidocaine with epinephrine. Ultrasound image documentation was performed. The microwire was kinked to measure appropriate catheter length. A stiff glidewire was advanced to the level of the IVC. A 16 cm hemodialysis catheter was then placed over the wire. Final catheter positioning was confirmed and documented with a spot radiographic image. The catheter aspirates and flushes normally. The catheter was flushed with appropriate volume heparin dwells. Dressings were applied. The patient tolerated the procedure well without immediate post procedural complication. IMPRESSION: Status post image guided temporary hemodialysis catheter placement. Signed, Dulcy Fanny. Dellia Nims, RPVI [facility] Inter[facility] Radiology Special[facility]  Radiology Electronically Signed   By: Corrie Mckusick D.O.   On: 02/09/2019 13:34    Labs: BMET Recent Labs  Lab 02/04/19 8676 02/05/19 0819 02/06/19 0314 02/07/19 0352 02/08/19 0312 02/09/19 0403 02/10/19 0332  NA 133* 134* 130* 130* 128* 131* 133*  K 4.4 5.0 5.0 5.3* 6.2* 4.2 3.5  CL 100 97* 97* 98 97* 97* 98  CO2 21* 22 21* 20* 19* 19* 21*  GLUCOSE 108* 93 85 66* 73 67* 72  BUN 79* 83* 85* 92* 98* 100* 66*  CREATININE 2.32* 2.71* 2.99* 3.21* 3.34* 3.60* 2.87*  CALCIUM 8.4* 8.4* 8.4* 8.4* 8.5* 8.6* 8.4*  PHOS  --   --  3.0 3.8 4.6 5.0* 4.1   CBC Recent Labs  Lab 02/05/19 1630  02/07/19 0352  02/08/19 0323  02/09/19 0403  02/09/19 1611 02/09/19 2302 02/10/19 0332 02/10/19 0939  WBC 16.3*   < > 13.7*  --  12.3*  --  11.9*  --   --   --  10.4  --   NEUTROABS 15.2*  --   --   --   --   --   --   --   --   --   --   --   HGB 10.9*   < > 10.1*   < > 9.0*   < > 8.3*   < > 8.7* 8.9* 8.7* 8.7*  HCT 32.2*   < > 28.6*   < > 26.4*   < > 24.5*   < > 24.8* 26.0* 25.2* 25.6*  MCV 83.9   < > 82.9  --  85.2  --  84.8  --   --   --  83.7  --   PLT 129*   < > 166  --  151  --  214  --   --   --  212  --    < > = values in this interval not displayed.    Medications:    . atorvastatin  40 mg Oral q1800  . Chlorhexidine Gluconate Cloth  6 each Topical Q0600  . feeding supplement (ENSURE ENLIVE)  237 mL Oral BID BM  . metoprolol succinate  12.5 mg Oral Daily  . pantoprazole  40 mg Oral Daily  . ursodiol  300 mg Oral BID   Elmarie Shiley, MD 02/10/2019, 11:48 AM

## 2019-02-11 LAB — HEMOGLOBIN AND HEMATOCRIT, BLOOD
HCT: 24.4 % — ABNORMAL LOW (ref 36.0–46.0)
HCT: 24.7 % — ABNORMAL LOW (ref 36.0–46.0)
HCT: 24.4 % — ABNORMAL LOW (ref 36.0–46.0)
Hemoglobin: 8.3 g/dL — ABNORMAL LOW (ref 12.0–15.0)
Hemoglobin: 8.3 g/dL — ABNORMAL LOW (ref 12.0–15.0)
Hemoglobin: 8 g/dL — ABNORMAL LOW (ref 12.0–15.0)

## 2019-02-11 LAB — CBC
HCT: 24.9 % — ABNORMAL LOW (ref 36.0–46.0)
Hemoglobin: 8.2 g/dL — ABNORMAL LOW (ref 12.0–15.0)
MCH: 29.3 pg (ref 26.0–34.0)
MCHC: 32.9 g/dL (ref 30.0–36.0)
MCV: 88.9 fL (ref 80.0–100.0)
Platelets: 215 10*3/uL (ref 150–400)
RBC: 2.8 MIL/uL — ABNORMAL LOW (ref 3.87–5.11)
RDW: 19.8 % — ABNORMAL HIGH (ref 11.5–15.5)
WBC: 9.5 10*3/uL (ref 4.0–10.5)
nRBC: 0.9 % — ABNORMAL HIGH (ref 0.0–0.2)

## 2019-02-11 LAB — RENAL FUNCTION PANEL
Albumin: 2.2 g/dL — ABNORMAL LOW (ref 3.5–5.0)
Anion gap: 16 — ABNORMAL HIGH (ref 5–15)
BUN: 74 mg/dL — ABNORMAL HIGH (ref 8–23)
CO2: 21 mmol/L — ABNORMAL LOW (ref 22–32)
Calcium: 8.5 mg/dL — ABNORMAL LOW (ref 8.9–10.3)
Chloride: 98 mmol/L (ref 98–111)
Creatinine, Ser: 3.58 mg/dL — ABNORMAL HIGH (ref 0.44–1.00)
GFR calc Af Amer: 14 mL/min — ABNORMAL LOW (ref >60)
GFR calc non Af Amer: 12 mL/min — ABNORMAL LOW (ref >60)
Glucose, Bld: 78 mg/dL (ref 70–99)
Phosphorus: 5.5 mg/dL — ABNORMAL HIGH (ref 2.5–4.6)
Potassium: 4 mmol/L (ref 3.5–5.1)
Sodium: 135 mmol/L (ref 135–145)

## 2019-02-11 LAB — PROTIME-INR
INR: 2.8 — ABNORMAL HIGH (ref 0.8–1.2)
Prothrombin Time: 29.3 seconds — ABNORMAL HIGH (ref 11.4–15.2)

## 2019-02-11 LAB — MAGNESIUM: Magnesium: 2.2 mg/dL (ref 1.7–2.4)

## 2019-02-11 NOTE — Progress Notes (Signed)
Patient ID: Marie Malone, female   DOB: 17-Jul-1950, 68 y.o.   MRN: [id number] [facility] Progress Note   Assessment/ Plan:   1.  Acute kidney injury:  Some urine output overnight-remains oliguric.  Differential diagnosis include ATN and acute interstitial nephritis (AIN) with available labs favoring hemodynamically mediated AKI/ATN.  Renal ultrasound negative for any obstruction and abdominal/pelvic imaging showing ascites/abdominal wall edema.  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  Plan for continued hemodialysis as we maintain surveillance for renal recovery.  Unfortunately, high risk for long-term dialysis. 2.  Hypoxic respiratory failure: Secondary to acute exacerbation of diastolic heart failure in this patient with pulmonary hypertension.  Status post hemodialysis on 10/2 with cautious ultrafiltration. 3.  Left leg cellulitis:  Status post completion of antibiotic course and no evidence of venous hypertension. 4.  Anemia: Possibly secondary to chronic disease and recent loss.    Hematochezia appears to have resolved and hemoglobin/hematocrit appear stable.  5.  History of mechanical mitral valve: On anticoagulation with warfarin 6.  History of primary biliary cirrhosis  Subjective:   Feeling tired this morning and denies any chest pain or shortness of breath.  Not feeling hungry.    Objective:   BP 121/63 (BP Location: Left Arm)   Pulse 87   Temp 97.6 F (36.4 C) (Axillary)   Resp 14   Ht 5' 2"  (1.575 m)   Wt 75.8 kg   SpO2 92%   BMI 30.54 kg/m   Intake/Output Summary (Last 24 hours) at 02/11/2019 1012 Last data filed at 02/10/2019 1744 Gross per 24 hour  Intake 54.73 ml  Output -  Net 54.73 ml   Weight change: -3.049 kg  Physical Exam: Gen: Resting comfortably in bed, awakens easily to calling out her name.   Appropriate responses with questions. CVS: Pulse regular rhythm, normal rate, normal S1 and S2 Resp: Decreased breath sounds over both bases, no distinct rhonchi Abd: Soft, slightly distended, bowel sounds normal Ext: 2+ left leg edema, trace-1+ right leg edema  Imaging: Ir Fluoro Guide Cv Line Right  Result Date: 02/09/2019 INDICATION: 68 year old female with a history of renal failure EXAM: TEMPORARY HEMODIALYSIS CATHETER WITH IMAGE GUIDANCE MEDICATIONS: None ANESTHESIA/SEDATION: None FLUOROSCOPY TIME:  Fluoroscopy Time: 0 minutes 6 seconds (1 mGy). COMPLICATIONS: None PROCEDURE: Informed written consent was obtained from the patient's family after a discussion of the risks, benefits, and alternatives to treatment. Questions regarding the procedure were encouraged and answered. The right neck was prepped with chlorhexidine in a sterile fashion, and a sterile drape was applied covering the operative field. Maximum barrier sterile technique with sterile gowns and gloves were used for the procedure. A timeout was performed prior to the initiation of the procedure. A micropuncture kit was utilized to access the right internal jugular vein under direct, real-time ultrasound guidance after the overlying soft tissues were anesthetized with 1% lidocaine with epinephrine. Ultrasound image documentation was performed. The microwire was kinked to measure appropriate catheter length. A stiff glidewire was advanced to the level of the IVC. A 16 cm hemodialysis catheter was then placed over the wire. Final catheter positioning was confirmed and documented with a spot radiographic image. The catheter aspirates and flushes normally. The catheter was flushed with appropriate volume heparin dwells. Dressings were applied. The patient tolerated the procedure well without immediate post procedural complication. IMPRESSION: Status post image guided temporary hemodialysis catheter placement.  Signed, Dulcy Fanny. Dellia Nims, RPVI  Vascular and Interventional Radiology Specialists [facility] Radiology Electronically Signed   By: Corrie Mckusick D.O.   On: 02/09/2019 13:34   Ir US Guide Vasc Access Right  Result Date: 02/09/2019 INDICATION: 68 year old female with a history of renal failure EXAM: TEMPORARY HEMODIALYSIS CATHETER WITH IMAGE GUIDANCE MEDICATIONS: None ANESTHESIA/SEDATION: None FLUOROSCOPY TIME:  Fluoroscopy Time: 0 minutes 6 seconds (1 mGy). COMPLICATIONS: None PROCEDURE: Informed written consent was obtained from the patient's family after a discussion of the risks, benefits, and alternatives to treatment. Questions regarding the procedure were encouraged and answered. The right neck was prepped with chlorhexidine in a sterile fashion, and a sterile drape was applied covering the operative field. Maximum barrier sterile technique with sterile gowns and gloves were used for the procedure. A timeout was performed prior to the initiation of the procedure. A micropuncture kit was utilized to access the right internal jugular vein under direct, real-time ultrasound guidance after the overlying soft tissues were anesthetized with 1% lidocaine with epinephrine. Ultrasound image documentation was performed. The microwire was kinked to measure appropriate catheter length. A stiff glidewire was advanced to the level of the IVC. A 16 cm hemodialysis catheter was then placed over the wire. Final catheter positioning was confirmed and documented with a spot radiographic image. The catheter aspirates and flushes normally. The catheter was flushed with appropriate volume heparin dwells. Dressings were applied. The patient tolerated the procedure well without immediate post procedural complication. IMPRESSION: Status post image guided temporary hemodialysis catheter placement. Signed, Dulcy Fanny. Dellia Nims, RPVI [facility] and Interventional Radiology Specialists [facility] Radiology Electronically Signed   By: Corrie Mckusick D.O.   On: 02/09/2019  13:34    Labs: BMET Recent Labs  Lab 02/05/19 5643 02/06/19 0314 02/07/19 0352 02/08/19 0312 02/09/19 0403 02/10/19 0332 02/11/19 0556  NA 134* 130* 130* 128* 131* 133* 135  K 5.0 5.0 5.3* 6.2* 4.2 3.5 4.0  CL 97* 97* 98 97* 97* 98 98  CO2 22 21* 20* 19* 19* 21* 21*  GLUCOSE 93 85 66* 73 67* 72 78  BUN 83* 85* 92* 98* 100* 66* 74*  CREATININE 2.71* 2.99* 3.21* 3.34* 3.60* 2.87* 3.58*  CALCIUM 8.4* 8.4* 8.4* 8.5* 8.6* 8.4* 8.5*  PHOS  --  3.0 3.8 4.6 5.0* 4.1 5.5*   CBC Recent Labs  Lab 02/05/19 1630  02/08/19 0323  02/09/19 0403  02/10/19 0332  02/10/19 1124 02/10/19 2156 02/11/19 0556 02/11/19 0646  WBC 16.3*   < > 12.3*  --  11.9*  --  10.4  --   --   --  9.5  --   NEUTROABS 15.2*  --   --   --   --   --   --   --   --   --   --   --   HGB 10.9*   < > 9.0*   < > 8.3*   < > 8.7*   < > 8.3* 8.5* 8.2* 8.3*  HCT 32.2*   < > 26.4*   < > 24.5*   < > 25.2*   < > 25.3* 25.2* 24.9* 24.4*  MCV 83.9   < > 85.2  --  84.8  --  83.7  --   --   --  88.9  --   PLT 129*   < > 151  --  214  --  212  --   --   --  215  --    < > =  values in this interval not displayed.    Medications:    . atorvastatin  40 mg Oral q1800  . Chlorhexidine Gluconate Cloth  6 each Topical Q0600  . feeding supplement (ENSURE ENLIVE)  237 mL Oral BID BM  . metoprolol succinate  12.5 mg Oral Daily  . pantoprazole  40 mg Oral Daily  . ursodiol  300 mg Oral BID   Elmarie Shiley, MD 02/11/2019, 10:12 AM

## 2019-02-11 NOTE — Progress Notes (Signed)
ANTICOAGULATION CONSULT NOTE  Pharmacy Consult for warfarin to heparin Indication: atrial fibrillation and mechanical MVR  Vital Signs: Temp: 97.6 F (36.4 C) (10/04 0620) Temp Source: Axillary (10/04 0620) BP: 121/63 (10/03 2118) Pulse Rate: 87 (10/03 2118)  Labs: Recent Labs    02/09/19 0403  02/10/19 0332  02/10/19 1811 02/10/19 2156 02/11/19 0556 02/11/19 0645 02/11/19 0646  HGB 8.3*   < > 8.7*   < >  --  8.5* 8.2*  --  8.3*  HCT 24.5*   < > 25.2*   < >  --  25.2* 24.9*  --  24.4*  PLT 214  --  212  --   --   --  215  --   --   LABPROT 31.7*  --  28.6*  --  29.6*  --   --  29.3*  --   INR 3.1*  --  2.7*  --  2.9*  --   --  2.8*  --   CREATININE 3.60*  --  2.87*  --   --   --  3.58*  --   --    < > = values in this interval not displayed.    Estimated Creatinine Clearance: 14.5 mL/min (A) (by C-G formula based on SCr of 3.58 mg/dL (H)).  Assessment: 68 y.o. female admitted with CHF/SOB, h/o Afib and mechanical MVR, to continue warfarin. PTA regimen is 4 mg daily, noted prior goal 2-3 has been adjusted to 2.5-3.5 this admit by cardiology. INR has been elevated likely due to vitamin K deficiency. Vitamin K given 9/22 and again 9/29 and 9/30.  INR this morning remains therapeutic at 2.9. Warfarin on hold, will begin IV heparin once INR < 2.5 to cover mechanical valve. CBC stable.  Goal of Therapy:  INR 2.5-3.5 Monitor platelets by anticoagulation protocol: Yes    Plan:  -Hold warfarin -Daily INR - start hep once INR <2.5      Thank you for allowing pharmacy to participate in this patient's care.  Jeydan Barner L. Devin Going, PharmD, [place] PGY1 Pharmacy Resident 02/11/19      8:00 AM  Please check AMION for all [place] phone numbers After 10:00 PM, call the [place] [phone]

## 2019-02-11 NOTE — Progress Notes (Signed)
Progress Note  Patient Name: Marie Malone Date of Encounter: 02/11/2019  Primary Cardiologist: [name], MD  Subjective   Somnolent but answers questions.  No chest pain or palpitations described.  Still has abdominal discomfort.  Inpatient Medications    Scheduled Meds: . atorvastatin  40 mg Oral q1800  . Chlorhexidine Gluconate Cloth  6 each Topical Q0600  . feeding supplement (ENSURE ENLIVE)  237 mL Oral BID BM  . metoprolol succinate  12.5 mg Oral Daily  . pantoprazole  40 mg Oral Daily  . ursodiol  300 mg Oral BID    PRN Meds: acetaminophen, diphenhydrAMINE, docusate sodium, lidocaine (PF), morphine injection, ondansetron **OR** ondansetron (ZOFRAN) IV, oxyCODONE **OR** oxyCODONE   Vital Signs    Vitals:   02/10/19 1300 02/10/19 1640 02/10/19 2118 02/11/19 0620  BP:  120/66 121/63   Pulse: 91 88 87   Resp: _0 Temp: 97.9 F (36.6 C)  (!) 97.5 F (36.4 C) 97.6 F (36.4 C)  TempSrc: Oral  Oral Axillary  SpO2: 98% 95% 92%   Weight:    75.8 kg  Height:        Intake/Output Summary (Last 24 hours) at 02/11/2019 0840 Last data filed at 02/10/2019 1744 Gross per 24 hour  Intake 174.73 ml  Output -  Net 174.73 ml   Filed Weights   02/09/19 2209 02/10/19 0700 02/11/19 0620  Weight: 78 kg 70.9 kg 75.8 kg    Telemetry    Atypical atrial flutter with 2:1 block.  Personally reviewed.  ECG    An ECG dated 02/09/2019 was personally reviewed today and demonstrated:  Atypical atrial flutter with variable conduction.  Physical Exam   GEN:  Elderly woman.  No acute distress.   Neck: No JVD. Cardiac:  Irregular, no gallop.  Respiratory: Nonlabored. Clear to auscultation bilaterally. GI: Soft, nontender, bowel sounds present. MS: No edema; No deformity.  Labs    Chemistry Recent Labs  Lab 02/05/19 [phone]  02/09/19 0403 02/10/19 0332 02/11/19 0556  NA 134*   < > 131* 133* 135  K 5.0   < > 4.2 3.5 4.0  CL 97*   < > 97* 98 98  CO2 22   < >  19* 21* 21*  GLUCOSE 93   < > 67* 72 78  BUN 83*   < > 100* 66* 74*  CREATININE 2.71*   < > 3.60* 2.87* 3.58*  CALCIUM 8.4*   < > 8.6* 8.4* 8.5*  PROT 8.7*  --   --   --   --   ALBUMIN 2.0*   < > 2.3* 2.3* 2.2*  AST 44*  --   --   --   --   ALT <5  --   --   --   --   ALKPHOS 76  --   --   --   --   BILITOT 1.7*  --   --   --   --   GFRNONAA 17*   < > 12* 16* 12*  GFRAA 20*   < > 14* 19* 14*  ANIONGAP 15   < > 15 14 16*   < > = values in this interval not displayed.     Hematology Recent Labs  Lab 02/09/19 0403  02/10/19 0332  02/10/19 2156 02/11/19 0556 02/11/19 0646  WBC 11.9*  --  10.4  --   --  9.5  --   RBC 2.89*  --  3.01*  --   --  2.80*  --   HGB 8.3*   < > 8.7*   < > 8.5* 8.2* 8.3*  HCT 24.5*   < > 25.2*   < > 25.2* 24.9* 24.4*  MCV 84.8  --  83.7  --   --  88.9  --   MCH 28.7  --  28.9  --   --  29.3  --   MCHC 33.9  --  34.5  --   --  32.9  --   RDW 18.1*  --  18.5*  --   --  19.8*  --   PLT 214  --  212  --   --  215  --    < > = values in this interval not displayed.    Cardiac Enzymes Recent Labs  Lab 01/24/19 0259 01/24/19 0738 01/24/19 0807 01/29/19 1635 01/29/19 1835  TROPONINIHS 132* 111* 113* 791* 718*    Radiology    Ir Fluoro Guide Cv Line Right  Result Date: 02/09/2019 INDICATION: 68 year old female with a history of renal failure EXAM: TEMPORARY HEMODIALYSIS CATHETER WITH IMAGE GUIDANCE MEDICATIONS: None ANESTHESIA/SEDATION: None FLUOROSCOPY TIME:  Fluoroscopy Time: 0 minutes 6 seconds (1 mGy). COMPLICATIONS: None PROCEDURE: Informed written consent was obtained from the patient's family after a discussion of the risks, benefits, and alternatives to treatment. Questions regarding the procedure were encouraged and answered. The right neck was prepped with chlorhexidine in a sterile fashion, and a sterile drape was applied covering the operative field. Maximum barrier sterile technique with sterile gowns and gloves were used for the procedure.  A timeout was performed prior to the initiation of the procedure. A micropuncture kit was utilized to access the right internal jugular vein under direct, real-time ultrasound guidance after the overlying soft tissues were anesthetized with 1% lidocaine with epinephrine. Ultrasound image documentation was performed. The microwire was kinked to measure appropriate catheter length. A stiff glidewire was advanced to the level of the IVC. A 16 cm hemodialysis catheter was then placed over the wire. Final catheter positioning was confirmed and documented with a spot radiographic image. The catheter aspirates and flushes normally. The catheter was flushed with appropriate volume heparin dwells. Dressings were applied. The patient tolerated the procedure well without immediate post procedural complication. IMPRESSION: Status post image guided temporary hemodialysis catheter placement. Signed, Dulcy Fanny. Dellia Nims, RPVI [facility] Radiology Electronically Signed   By: Corrie Mckusick D.O.   On: 02/09/2019 13:34   Ir US Guide Vasc Access Right  Result Date: 02/09/2019 INDICATION: 68 year old female with a history of renal failure EXAM: TEMPORARY HEMODIALYSIS CATHETER WITH IMAGE GUIDANCE MEDICATIONS: None ANESTHESIA/SEDATION: None FLUOROSCOPY TIME:  Fluoroscopy Time: 0 minutes 6 seconds (1 mGy). COMPLICATIONS: None PROCEDURE: Informed written consent was obtained from the patient's family after a discussion of the risks, benefits, and alternatives to treatment. Questions regarding the procedure were encouraged and answered. The right neck was prepped with chlorhexidine in a sterile fashion, and a sterile drape was applied covering the operative field. Maximum barrier sterile technique with sterile gowns and gloves were used for the procedure. A timeout was performed prior to the initiation of the procedure. A micropuncture kit was utilized to access the right internal jugular  vein under direct, real-time ultrasound guidance after the overlying soft tissues were anesthetized with 1% lidocaine with epinephrine. Ultrasound image documentation was performed. The microwire was kinked to measure appropriate catheter length. A stiff glidewire was advanced  to the level of the IVC. A 16 cm hemodialysis catheter was then placed over the wire. Final catheter positioning was confirmed and documented with a spot radiographic image. The catheter aspirates and flushes normally. The catheter was flushed with appropriate volume heparin dwells. Dressings were applied. The patient tolerated the procedure well without immediate post procedural complication. IMPRESSION: Status post image guided temporary hemodialysis catheter placement. Signed, Dulcy Fanny. Dellia Nims, RPVI [facility] Radiology Electronically Signed   By: Corrie Mckusick D.O.   On: 02/09/2019 13:34    Cardiac Studies   Echocardiogram 01/24/2019: 1. Left ventricular ejection fraction, by visual estimation, is 45 to 50%. The left ventricle has mildly decreased function. Normal left ventricular size. Left ventricular septal wall thickness was normal. Normal left ventricular posterior wall  thickness. There is no left ventricular hypertrophy. 2. Indeterminate diastolic function due to mechanical mitral valve pattern of LV diastolic filling. 3. Right ventricular pressure overload and right ventricular volume overload. 4. Global right ventricle has moderately reduced systolic function.The right ventricular size is moderately enlarged. No increase in right ventricular wall thickness. 5. Left atrial size was moderately dilated. 6. Right atrial size was severely dilated. 7. The mitral valve has been repaired/replaced. No evidence of mitral valve regurgitation. No evidence of mitral stenosis. 8. The tricuspid valve is normal in structure. Tricuspid valve regurgitation mild-moderate. 9.  The aortic valve The aortic valve is normal in structure. Aortic valve regurgitation was not visualized by color flow Doppler. Structurally normal aortic valve, with no evidence of sclerosis or stenosis. 10. The pulmonic valve was normal in structure. Pulmonic valve regurgitation is mild by color flow Doppler. 11. Moderately elevated pulmonary artery systolic pressure. 12. The inferior vena cava is dilated in size with <50% respiratory variability, suggesting right atrial pressure of 15 mmHg.  Patient Profile     68 y.o. female with a history of rheumatic fever/mitral valve stenosis s/p valvuloplasty at [facility] in 2006, CAD s/p CABG, mechanical MVR, paroxysmal afib with MAZE procedure in 2018 on coumadin, PAD s/p endarterectomy in 03/2016 who was admitted on 9/16 for acute heart failurein the setting of left leg cellulitis,complicated by aflutter, now found to have worsening AKI.  Assessment & Plan    1.  Atypical atrial flutter with variable conduction, also history of atrial fibrillation status post MAZE procedure in 2018.  CHADSVASC score is 4.  She has been seen by EP and continues on metoprolol for heart rate control.  Depending on her overall clinical progress, can consider possible TEE guided cardioversion.  At the present time Coumadin has been held with heparin bridge.  2.  Acute on chronic combined heart failure with LVEF 45 to 50%.  Lasix held in the setting of AKI.  3.  Acute kidney injury, oliguric and followed by nephrology with temporary hemodialysis planned.  She has a right IJ dialysis catheter access.  4.  Acute on chronic anemia with intermittent hematochezia.  GI is following.  Hemoglobin 8.3 is relatively stable.  5.  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  She is on heparin bridge with Coumadin on hold at present.  Continue heparin with management by pharmacy.  She is on Toprol-XL with adequate heart rate control and also on Lipitor.  Continue to hold  diuretics for now, volume can be managed by hemodialysis pending hopeful improvement in renal function.  Plan for TEE guided cardioversion will need to be deferred until other active comorbidities have stabilized.  Signed,  Rozann Lesches, MD  02/11/2019, 8:40 AM

## 2019-02-11 NOTE — Progress Notes (Signed)
[name]  DPO:[id number] DOB: 1951-04-05 DOA: 01/25/2019 PCP: Marie Alar, NP   Brief Narrative:   Marie Pitstick Lipfordis a 68 y.o. 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 Patient states over the last 2 weeks patient has been getting progressively short of breath on exertion. Denies any chest pain fever chills or productive cough. Patient's primary care physician advised her to increase her Lasix twice daily which she is supposed to start today. Because of worsening shortness of breath patient came to the ER. EMS was called and patient initially was placed on BiPAP. Patient also had complained of some left calf cramping.  10/1: More interactive today. K+ up ON. 10/2: A little tired today. 10/3: Will wake and interactive with effort. Says legs feel better, but hurts elsewhere. Vague. 10/4: awake, alert this morning. No acute events ON per nursing.   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   S/P CABG x 1   Acute on chronic systolic CHF (congestive heart failure) (HCC)   Atrial fibrillation, chronic (HCC)   Cellulitis of left leg   Acute renal failure (HCC)   Primary biliary cirrhosis (HCC)   Chest pain at rest   H/O mitral valve replacement with mechanical valve   Atypical atrial flutter (HCC)   AKI (acute kidney injury) ([place])   Hematochezia   Supratherapeutic INR  LLE cellulitis - Bld Cx, UCx NGTD  - WBC improved to 13.7, afebrile - vanc/ceftriaxone - narrowed to ancef per ID - ID c/s due to slow to improve ->improved on ancef  - recommending keflex at discharge (9/25) -Patient also with loose stools (possibly from Ursodiol) however patient has been chronically on this medication. - lactoferrin negative and c diff negative - PT rec SNF - continue ancef - leg pain is improving; able to manipulate her leg more today - stop abx today and monitor     - afebrile, WBC normal, abx completed; leg is better  LLE Swelling - suspect this is due to above - elevate LE above heart, continue to monitor - given significant LLE edema, more than expected, CT abdomen/pelvis without contrast obtained on 9/28 to eval for mass affecting venous return - notable for cirrhosis, anasarca - see 9/28 CT report - some concern for posterior knee bleed vs compartment syndrome on 9/22 ->but low suspicion for this based on imaging and exam with soft compartments - CT was not suggestive of hematoma, but did have marked skin thickening and subcutaneous edema - LLE [place] from 9/22 read an notable for "significant fluid in posterior calf, unclear etiology" - discussed this with vascular to ensure this did not appear to be fluid collection and he noted appeared c/w edema -pt with good distal pulses, will hold off on vascular c/s - see above  Acute renal failure (baseline Cr 1.0)/ATN NGMA Hyponatremia Hyperkalemia - nephro onboard, appreciate assistance - K+ resolved     - daily assessments for HD     - has ATN, watching for renal recovery; nephrology believes significant possibility of needing long term HD  Acute respiratory failure with hypoxia - resolved  Chest pain acute started 9/21 Elevated Troponin -Patient borderline hypotensive with MAP 60-63 -EKG: atrial flutter, nonspecific ST-T wave changes no changes from previous EKG -Troponin high-sensitivity 791 -> 718 - cards following and aware - no plans for additional ischemic workup at this point  Acute on  chronic systolic CHF - follow I&O and daily wts  - EF 40-45% - cards consulted - metoprolol XL 12.5mg    Mechanical mitral valve -Continue warfarin, monitor INR - Therapeutic INR goal 2.5-3.5 - Supratherapeutic; repeat INR this afternoon; may need more Vit K per GI - INR remains in therapeutic range  Atrial fibrillation chronic -Currently NSR -Anticoagulated for her mechanical mitral valve - Consider DCCV prior to d/c (vs TEE/DCCV if pt INR becomes subtherapeutic)  - per cards, going to discuss with EP given suspicion for a-tach      - per cards: Depending on clinical progress may ultimately be considered for a TEE guided cardioversion     - TEE cardioversion deferred until pt more stable  Primary biliary cirrhosis(PBC) -Ursodial 300 mg BID.  - benadryl for itching  Diarrhea -9/22 patient reports diarrhea to cardiology - Fecal fat wnl - GI pathogen panel by PCR negative  - Lactoferrin negative - resolved  Anemia GIB - Hbg stable; monitor - No endoscopy planned; GI has s/o'd  Hypotension - resolved  DVT prophylaxis: INR > 2.5; coumdin held Code Status: FULL Family Communication: Spoke with dtr   Disposition Plan: TBD  Consultants:   Nephrology  Cardiology   ROS:  Denies CP, dyspnea, N, V. Reports leg improved. Remainder 10-pt ROS is negative for all not previously mentioned.  Subjective: "I don't want to eat sometimes."  Objective: Vitals:   02/10/19 1640 02/10/19 2118 02/11/19 0620 02/11/19 1030  BP: 120/66 121/63  (!) 104/46  Pulse: 88 87  88  Resp: 11 14    Temp:  (!) 97.5 F (36.4 C) 97.6 F (36.4 C)   TempSrc:  Oral Axillary   SpO2: 95% 92%    Weight:   75.8 kg   Height:        Intake/Output Summary (Last 24 hours) at 02/11/2019 1403 Last data filed at 02/10/2019 1744 Gross per 24 hour  Intake 54.73 ml  Output --  Net 54.73 ml   Filed  Weights   02/09/19 2209 02/10/19 0700 02/11/19 0620  Weight: 78 kg 70.9 kg 75.8 kg    Examination:  General:68 y.o.femaleresting in bed in NAD Eyes: PERRL, normal sclera Cardiovascular: RRR, +S1, S2, no m/g/r, equal pulses throughout Respiratory: CTABL, no w/r/r, normal WOB GI: BS+, NDNT, no masses noted, no organomegaly noted MSK: No e/c/c Skin: No rashes, bruises, ulcerations noted Neuro: Alert to name, follows commands   Data Reviewed: I have personally reviewed following labs and imaging studies.  CBC: Recent Labs  Lab 02/05/19 1630  02/07/19 0352  02/08/19 0323  02/09/19 0403  02/10/19 0332 02/10/19 0939 02/10/19 1124 02/10/19 2156 02/11/19 0556 02/11/19 0646  WBC 16.3*   < > 13.7*  --  12.3*  --  11.9*  --  10.4  --   --   --  9.5  --   NEUTROABS 15.2*  --   --   --   --   --   --   --   --   --   --   --   --   --   HGB 10.9*   < > 10.1*   < > 9.0*   < > 8.3*   < >  8.7* 8.7* 8.3* 8.5* 8.2* 8.3*  HCT 32.2*   < > 28.6*   < > 26.4*   < > 24.5*   < > 25.2* 25.6* 25.3* 25.2* 24.9* 24.4*  MCV 83.9   < > 82.9  --  85.2  --  84.8  --  83.7  --   --   --  88.9  --   PLT 129*   < > 166  --  151  --  214  --  212  --   --   --  215  --    < > = values in this interval not displayed.   Basic Metabolic Panel: Recent Labs  Lab 02/07/19 0352 02/08/19 0312 02/09/19 0403 02/10/19 0332 02/11/19 0556  NA 130* 128* 131* 133* 135  K 5.3* 6.2* 4.2 3.5 4.0  CL 98 97* 97* 98 98  CO2 20* 19* 19* 21* 21*  GLUCOSE 66* 73 67* 72 78  BUN 92* 98* 100* 66* 74*  CREATININE 3.21* 3.34* 3.60* 2.87* 3.58*  CALCIUM 8.4* 8.5* 8.6* 8.4* 8.5*  MG 2.4 2.4 2.3 2.2 2.2  PHOS 3.8 4.6 5.0* 4.1 5.5*   GFR: Estimated Creatinine Clearance: 14.5 mL/min (A) (by C-G formula based on SCr of 3.58 mg/dL (H)). Liver Function Tests: Recent Labs  Lab 02/05/19 0819  02/07/19 0352 02/08/19 0312 02/09/19 0403 02/10/19 0332 02/11/19 0556  AST 44*  --   --   --   --   --   --   ALT <5  --   --    --   --   --   --   ALKPHOS 76  --   --   --   --   --   --   BILITOT 1.7*  --   --   --   --   --   --   PROT 8.7*  --   --   --   --   --   --   ALBUMIN 2.0*   < > 1.9* 2.1* 2.3* 2.3* 2.2*   < > = values in this interval not displayed.   No results for input(s): LIPASE, AMYLASE in the last 168 hours. Recent Labs  Lab 02/10/19 1124  AMMONIA 34   Coagulation Profile: Recent Labs  Lab 02/08/19 1800 02/09/19 0403 02/10/19 0332 02/10/19 1811 02/11/19 0645  INR 3.1* 3.1* 2.7* 2.9* 2.8*   Cardiac Enzymes: No results for input(s): CKTOTAL, CKMB, CKMBINDEX, TROPONINI in the last 168 hours. BNP (last 3 results) No results for input(s): PROBNP in the last 8760 hours. HbA1C: No results for input(s): HGBA1C in the last 72 hours. CBG: No results for input(s): GLUCAP in the last 168 hours. Lipid Profile: No results for input(s): CHOL, HDL, LDLCALC, TRIG, CHOLHDL, LDLDIRECT in the last 72 hours. Thyroid Function Tests: No results for input(s): TSH, T4TOTAL, FREET4, T3FREE, THYROIDAB in the last 72 hours. Anemia Panel: No results for input(s): VITAMINB12, FOLATE, FERRITIN, TIBC, IRON, RETICCTPCT in the last 72 hours. Sepsis Labs: No results for input(s): PROCALCITON, LATICACIDVEN in the last 168 hours.  No results found for this or any previous visit (from the past 240 hour(s)).    Radiology Studies: No results found.   Scheduled Meds:  atorvastatin  40 mg Oral q1800   Chlorhexidine Gluconate Cloth  6 each Topical Q0600   feeding supplement (ENSURE ENLIVE)  237 mL Oral BID BM   metoprolol succinate  12.5 mg Oral Daily   pantoprazole  40 mg Oral Daily   ursodiol  300 mg Oral BID   Continuous Infusions:   LOS: 18 days    Time spent: 25 minutes spent in the coordination of care today.    Jonnie Finner, DO [facility] Pager [phone]  If 7PM-7AM, please contact night-coverage [email or web address] Password [email or web address] 02/11/2019, 2:03 PM

## 2019-02-11 NOTE — Progress Notes (Addendum)
Patient ID: Marie Malone, female   DOB: Aug 18, 1950, 68 y.o.   MRN: [id number]    Progress Note   Subjective  Day # 19 CC; GI bleeding/supratherapeutic INR  Hemoglobin 8.2, down from 8.7 yesterday INR 2.8 Ammonia 35  Patient resting comfortably, daughter at bedside.  Daughter says mom has still been lethargic but was awake and talking earlier this morning.   Objective   Vital signs in last 24 hours: Temp:  [97.5 F (36.4 C)-97.9 F (36.6 C)] 97.6 F (36.4 C) (10/04 0620) Pulse Rate:  [87-91] 88 (10/04 1030) Resp:  [11-17] 14 (10/03 2118) BP: (104-121)/(46-66) 104/46 (10/04 1030) SpO2:  [92 %-98 %] 92 % (10/03 2118) Weight:  [75.8 kg] 75.8 kg (10/04 0620) Last BM Date: 02/08/19 General:    Older African-American female in NAD, sleeping comfortably Heart:  Regular rate and rhythm; no murmurs Lungs: Respirations even and unlabored, lungs CTA bilaterally Abdomen:  Soft, nontender and nondistended. Normal bowel sounds. Extremities:  Without edema. Neurologic: Somnolent currently. Psych:  Cooperative.    Lab Results: Recent Labs    02/09/19 0403  02/10/19 0332  02/10/19 2156 02/11/19 0556 02/11/19 0646  WBC 11.9*  --  10.4  --   --  9.5  --   HGB 8.3*   < > 8.7*   < > 8.5* 8.2* 8.3*  HCT 24.5*   < > 25.2*   < > 25.2* 24.9* 24.4*  PLT 214  --  212  --   --  215  --    < > = values in this interval not displayed.   BMET Recent Labs    02/09/19 0403 02/10/19 0332 02/11/19 0556  NA 131* 133* 135  K 4.2 3.5 4.0  CL 97* 98 98  CO2 19* 21* 21*  GLUCOSE 67* 72 78  BUN 100* 66* 74*  CREATININE 3.60* 2.87* 3.58*  CALCIUM 8.6* 8.4* 8.5*   LFT Recent Labs    02/11/19 0556  ALBUMIN 2.2*   PT/INR Recent Labs    02/10/19 1811 02/11/19 0645  LABPROT 29.6* 29.3*  INR 2.9* 2.8*      Assessment / Plan:    #65 68 year old African-American female with hypoxic respiratory failure, secondary to acute exacerbation of CHF, history of pulmonary hypertension  #2  acute kidney injury-now with  uremia-started hemodialysis 02/09/2019, anticipating will need long-term dialysis  #3 left lower extremity cellulitis-pleated antibiotics  #4 history of mechanical mitral valve replacement-on Coumadin  #5 primary biliary cirrhosis  #6 hematochezia in setting of INR greater than 5  Coumadin has been held INR out back to goal level No active hemorrhage Continue daily PPI Plan is to transfuse as needed, no endoscopic evaluation planned.  GI will sign off, available if needed I discussed with patient's daughter who is at bedside this morning   LOS: 18 days   Amy EsterwoodPA-C  02/11/2019, 11:48 AM  Agree with Ms. Genia Harold assessment and plan. Gatha Mayer, MD, [facility]

## 2019-02-12 LAB — HEPATIC FUNCTION PANEL
ALT: <5 U/L (ref 0–44)
AST: 152 U/L — ABNORMAL HIGH (ref 15–41)
Albumin: 2.2 g/dL — ABNORMAL LOW (ref 3.5–5.0)
Alkaline Phosphatase: 67 U/L (ref 38–126)
Bilirubin, Direct: 1 mg/dL — ABNORMAL HIGH (ref 0.0–0.2)
Indirect Bilirubin: 1.2 mg/dL — ABNORMAL HIGH (ref 0.3–0.9)
Total Bilirubin: 2.2 mg/dL — ABNORMAL HIGH (ref 0.3–1.2)
Total Protein: 9.1 g/dL — ABNORMAL HIGH (ref 6.5–8.1)

## 2019-02-12 LAB — CBC
HCT: 24.4 % — ABNORMAL LOW (ref 36.0–46.0)
Hemoglobin: 8.2 g/dL — ABNORMAL LOW (ref 12.0–15.0)
MCH: 29 pg (ref 26.0–34.0)
MCHC: 33.6 g/dL (ref 30.0–36.0)
MCV: 86.2 fL (ref 80.0–100.0)
Platelets: 209 10*3/uL (ref 150–400)
RBC: 2.83 MIL/uL — ABNORMAL LOW (ref 3.87–5.11)
RDW: 20.3 % — ABNORMAL HIGH (ref 11.5–15.5)
WBC: 9.2 10*3/uL (ref 4.0–10.5)
nRBC: 1.4 % — ABNORMAL HIGH (ref 0.0–0.2)

## 2019-02-12 LAB — RENAL FUNCTION PANEL
Albumin: 2.1 g/dL — ABNORMAL LOW (ref 3.5–5.0)
Anion gap: 15 (ref 5–15)
BUN: 76 mg/dL — ABNORMAL HIGH (ref 8–23)
CO2: 21 mmol/L — ABNORMAL LOW (ref 22–32)
Calcium: 8.7 mg/dL — ABNORMAL LOW (ref 8.9–10.3)
Chloride: 99 mmol/L (ref 98–111)
Creatinine, Ser: 4.26 mg/dL — ABNORMAL HIGH (ref 0.44–1.00)
GFR calc Af Amer: 12 mL/min — ABNORMAL LOW (ref >60)
GFR calc non Af Amer: 10 mL/min — ABNORMAL LOW (ref >60)
Glucose, Bld: 81 mg/dL (ref 70–99)
Phosphorus: 6.4 mg/dL — ABNORMAL HIGH (ref 2.5–4.6)
Potassium: 4.4 mmol/L (ref 3.5–5.1)
Sodium: 135 mmol/L (ref 135–145)

## 2019-02-12 LAB — HEMOGLOBIN AND HEMATOCRIT, BLOOD
HCT: 24.6 % — ABNORMAL LOW (ref 36.0–46.0)
HCT: 23.4 % — ABNORMAL LOW (ref 36.0–46.0)
Hemoglobin: 8.3 g/dL — ABNORMAL LOW (ref 12.0–15.0)
Hemoglobin: 7.6 g/dL — ABNORMAL LOW (ref 12.0–15.0)

## 2019-02-12 LAB — PROTIME-INR
INR: 2.9 — ABNORMAL HIGH (ref 0.8–1.2)
Prothrombin Time: 29.7 seconds — ABNORMAL HIGH (ref 11.4–15.2)

## 2019-02-12 LAB — IRON AND TIBC
Iron: 96 ug/dL (ref 28–170)
Saturation Ratios: 41 % — ABNORMAL HIGH (ref 10.4–31.8)
TIBC: 232 ug/dL — ABNORMAL LOW (ref 250–450)
UIBC: 136 ug/dL

## 2019-02-12 LAB — FERRITIN: Ferritin: 699 ng/mL — ABNORMAL HIGH (ref 11–307)

## 2019-02-12 LAB — MAGNESIUM: Magnesium: 2.4 mg/dL (ref 1.7–2.4)

## 2019-02-12 LAB — HEPATITIS B E ANTIGEN: Hep B E Ag: NEGATIVE

## 2019-02-12 LAB — HEPATITIS B SURFACE ANTIBODY, QUANTITATIVE: Hep B S AB Quant (Post): 107.4 m[IU]/mL (ref >9.9)

## 2019-02-12 MED ORDER — HEPARIN SODIUM (PORCINE) 1000 UNIT/ML IJ SOLN
INTRAMUSCULAR | Status: AC
  Filled 2019-02-12: qty 3

## 2019-02-12 MED ORDER — DARBEPOETIN ALFA 60 MCG/0.3ML IJ SOSY: 60.0000 ug | PREFILLED_SYRINGE | INTRAMUSCULAR | Status: DC

## 2019-02-12 NOTE — Progress Notes (Addendum)
ANTICOAGULATION CONSULT NOTE  Pharmacy Consult for warfarin to heparin Indication: atrial fibrillation and mechanical MVR  Vital Signs: Temp: 97.6 F (36.4 C) (10/05 0539) Temp Source: Oral (10/05 0539) BP: 119/58 (10/05 0539) Pulse Rate: 84 (10/05 0539)  Labs: Recent Labs    02/10/19 0332  02/10/19 1811  02/11/19 0556 02/11/19 0645  02/11/19 1547 02/11/19 2203 02/12/19 0605  HGB 8.7*   < >  --    < > 8.2*  --    < > 8.3* 8.0* 8.2*  HCT 25.2*   < >  --    < > 24.9*  --    < > 24.7* 24.4* 24.4*  PLT 212  --   --   --  215  --   --   --   --  209  LABPROT 28.6*  --  29.6*  --   --  29.3*  --   --   --  29.7*  INR 2.7*  --  2.9*  --   --  2.8*  --   --   --  2.9*  CREATININE 2.87*  --   --   --  3.58*  --   --   --   --  4.26*   < > = values in this interval not displayed.    Estimated Creatinine Clearance: 12.1 mL/min (A) (by C-G formula based on SCr of 4.26 mg/dL (H)).  Assessment: 68 y.o. female admitted with CHF/SOB, h/o Afib and mechanical MVR, to continue warfarin. PTA regimen is 4 mg daily, noted prior goal 2-3 has been adjusted to 2.5-3.5 this admit by cardiology. INR has been elevated likely due to vitamin K deficiency. Vitamin K given 9/22 and again 9/29 and 9/30.  INR this morning remains therapeutic at 2.9. Warfarin on hold, will begin IV heparin once INR < 2.5 to cover mechanical valve. CBC stable, no further rectal bleeding noted. Discussed with cardiology and MD Marylyn Ishihara - will check LFTs and if they remain normal and clinical condition is the same tomorrow, will cautiously reload warfarin on 10/6.  Goal of Therapy:  INR 2.5-3.5 Monitor platelets by anticoagulation protocol: Yes    Plan:  -Hold warfarin again tonight -Daily INR - start hep if INR <2.5   Arrie Senate, PharmD, BCPS Clinical Pharmacist [phone] Please check AMION for all [place] numbers 02/12/2019

## 2019-02-12 NOTE — Progress Notes (Signed)
PT Cancellation Note  Patient Details Name: Marie Malone MRN: [id number] DOB: 02/05/1951   Cancelled Treatment:    Reason Eval/Treat Not Completed: Patient at procedure or test/unavailable. Pt off floor at HD. PT to return as able to progress mobility.  Kittie Plater, PT, DPT Acute Rehabilitation Services Pager #: [phone] Office #: [phone]    Berline Lopes 02/12/2019, 8:34 AM

## 2019-02-12 NOTE — Progress Notes (Signed)
Patient ID: Marie Malone, female   DOB: 12-18-1950, 68 y.o.   MRN: [id number] [facility] Progress Note   Assessment/ Plan:   1.  Acute kidney injury:  Some urine output overnight-remains oliguric.  Differential diagnosis include ATN and acute interstitial nephritis (AIN) with available labs favoring hemodynamically mediated AKI/ATN.  Renal ultrasound negative for any obstruction and abdominal/pelvic imaging showing ascites/abdominal wall edema.  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   - HD today per MWF schedule for now as needed.  Monitor for renal recovery  - Unfortunately, high risk for long-term dialysis. - Please transition off of morphine for pain given AKI   2.  Hypoxic respiratory failure: Secondary to acute exacerbation of diastolic heart failure in this patient with pulmonary hypertension.  Optimize volume with HD as tolerated   3.  Left leg cellulitis:  Status post completion of antibiotic course  4.  Anemia: Possibly secondary to chronic disease and recent acute loss.    Hematochezia appears to have resolved and hemoglobin/hematocrit appear stable. Update iron panel.  aranesp started 10/5  5.  History of mechanical mitral valve: On anticoagulation per primary team. Hx supratherapeutic INR, downtrending to normal  6.  History of primary biliary cirrhosis  Subjective:   Seen on HD at 10:05 am;  Procedure supervised. Strict ins/outs not available - one void charted yesterday.  Last HD on 10/2 with 500 mL charted.  She states no history of malignancy and is ok with [place] starting ESA  Review of systems:  Reports shortness of breath; no CP Reports nausea, no vomiting    Objective:   BP (!) 117/54 (BP Location: Right Arm)   Pulse 82   Temp 97.7 F (36.5 C) (Oral)   Resp 15   Ht 5\' 2"  (1.575 m)   Wt 76 kg   SpO2 97%    BMI 30.65 kg/m   Intake/Output Summary (Last 24 hours) at 02/12/2019 1005 Last data filed at 02/11/2019 1200 Gross per 24 hour  Intake 60 ml  Output -  Net 60 ml   Weight change: -1.814 kg  Physical Exam:  Gen: adult female in bed in NAD CVS: Pulse regular rhythm, normal rate, normal S1 and S2 Resp: clear but decreased breath sounds over both bases, no distinct rhonchi Abd: Soft, slightly distended, bowel sounds normal Ext: 1+ edema lower  Access: RIJ nontunneled catheter   Imaging: No results found.  Labs: BMET Recent Labs  Lab 02/06/19 0314 02/07/19 0352 02/08/19 0312 02/09/19 0403 02/10/19 0332 02/11/19 0556 02/12/19 0605  NA 130* 130* 128* 131* 133* 135 135  K 5.0 5.3* 6.2* 4.2 3.5 4.0 4.4  CL 97* 98 97* 97* 98 98 99  CO2 21* 20* 19* 19* 21* 21* 21*  GLUCOSE 85 66* 73 67* 72 78 81  BUN 85* 92* 98* 100* 66* 74* 76*  CREATININE 2.99* 3.21* 3.34* 3.60* 2.87* 3.58* 4.26*  CALCIUM 8.4* 8.4* 8.5* 8.6* 8.4* 8.5* 8.7*  PHOS 3.0 3.8 4.6 5.0* 4.1 5.5* 6.4*   CBC Recent Labs  Lab 02/05/19 1630  02/09/19 0403  02/10/19 0332  02/11/19 0556 02/11/19 0646 02/11/19 1547 02/11/19 2203 02/12/19 0605  WBC 16.3*   < > 11.9*  --  10.4  --  9.5  --   --   --  9.2  NEUTROABS 15.2*  --   --   --   --   --   --   --   --   --   --  HGB 10.9*   < > 8.3*   < > 8.7*   < > 8.2* 8.3* 8.3* 8.0* 8.2*  HCT 32.2*   < > 24.5*   < > 25.2*   < > 24.9* 24.4* 24.7* 24.4* 24.4*  MCV 83.9   < > 84.8  --  83.7  --  88.9  --   --   --  86.2  PLT 129*   < > 214  --  212  --  215  --   --   --  209   < > = values in this interval not displayed.    Medications:    . atorvastatin  40 mg Oral q1800  . Chlorhexidine Gluconate Cloth  6 each Topical Q0600  . feeding supplement (ENSURE ENLIVE)  237 mL Oral BID BM  . metoprolol succinate  12.5 mg Oral Daily  . pantoprazole  40 mg Oral Daily  . ursodiol  300 mg Oral BID   Claudia Desanctis  02/12/2019, 10:05 AM

## 2019-02-12 NOTE — Progress Notes (Signed)
Progress Note  Patient Name: Marie Malone Date of Encounter: 02/12/2019  Primary Cardiologist: [name], MD  Subjective   Sleepy, but rouses to verbal.  Denies chest pain or palpitations SOB better on HD  Inpatient Medications    Scheduled Meds: . atorvastatin  40 mg Oral q1800  . Chlorhexidine Gluconate Cloth  6 each Topical Q0600  . feeding supplement (ENSURE ENLIVE)  237 mL Oral BID BM  . metoprolol succinate  12.5 mg Oral Daily  . pantoprazole  40 mg Oral Daily  . ursodiol  300 mg Oral BID    PRN Meds: acetaminophen, diphenhydrAMINE, docusate sodium, lidocaine (PF), morphine injection, ondansetron **OR** ondansetron (ZOFRAN) IV, oxyCODONE **OR** oxyCODONE   Vital Signs    Vitals:   02/11/19 1300 02/11/19 2112 02/12/19 0539 02/12/19 0605  BP: 109/80 (!) 108/54 (!) 119/58   Pulse: 89 84 84   Resp: 17  15   Temp: 97.8 F (36.6 C) 97.6 F (36.4 C) 97.6 F (36.4 C)   TempSrc: Axillary Oral Oral   SpO2:  97% 99%   Weight:    73.9 kg  Height:        Intake/Output Summary (Last 24 hours) at 02/12/2019 0817 Last data filed at 02/11/2019 1200 Gross per 24 hour  Intake 120 ml  Output -  Net 120 ml   Filed Weights   02/10/19 0700 02/11/19 0620 02/12/19 0605  Weight: 70.9 kg 75.8 kg 73.9 kg    Telemetry    Seen in HD, appears to be Afib on telemetry, rate ok .  Personally reviewed.  ECG    An ECG dated 02/09/2019 was personally reviewed today and demonstrated:  Atypical atrial flutter with variable conduction.  Physical Exam   General: Well developed, elderly female in no acute distress, sleepy Head: Eyes PERRLA Normocephalic and atraumatic Lungs: Clear anteriorly to auscultation. Heart: HRRR S1 S2, without rub or gallop. 2-3/6 murmur, crisp valve click.  UE Pulses are 2+ & equal. No JVD seen, HD cath on R side. LE pulses weak, cap refill ok Abdomen: Bowel sounds are present, abdomen soft and tender without masses or  hernias noted. Msk: weak  strength and tone for age. Extremities: No clubbing, cyanosis, trace LLE edema.    Skin:  No rashes or lesions noted. LLE has dressing, not disturbed Neuro: oriented X 2, sleepy, but rouses to verbal   Labs    Chemistry Recent Labs  Lab 02/05/19 0819  02/10/19 0332 02/11/19 0556 02/12/19 0605  NA 134*   < > 133* 135 135  K 5.0   < > 3.5 4.0 4.4  CL 97*   < > 98 98 99  CO2 22   < > 21* 21* 21*  GLUCOSE 93   < > 72 78 81  BUN 83*   < > 66* 74* 76*  CREATININE 2.71*   < > 2.87* 3.58* 4.26*  CALCIUM 8.4*   < > 8.4* 8.5* 8.7*  PROT 8.7*  --   --   --   --   ALBUMIN 2.0*   < > 2.3* 2.2* 2.1*  AST 44*  --   --   --   --   ALT <5  --   --   --   --   ALKPHOS 76  --   --   --   --   BILITOT 1.7*  --   --   --   --   GFRNONAA 17*   < >  16* 12* 10*  GFRAA 20*   < > 19* 14* 12*  ANIONGAP 15   < > 14 16* 15   < > = values in this interval not displayed.     Hematology Recent Labs  Lab 02/10/19 0332  02/11/19 0556  02/11/19 1547 02/11/19 2203 02/12/19 0605  WBC 10.4  --  9.5  --   --   --  9.2  RBC 3.01*  --  2.80*  --   --   --  2.83*  HGB 8.7*   < > 8.2*   < > 8.3* 8.0* 8.2*  HCT 25.2*   < > 24.9*   < > 24.7* 24.4* 24.4*  MCV 83.7  --  88.9  --   --   --  86.2  MCH 28.9  --  29.3  --   --   --  29.0  MCHC 34.5  --  32.9  --   --   --  33.6  RDW 18.5*  --  19.8*  --   --   --  20.3*  PLT 212  --  215  --   --   --  209   < > = values in this interval not displayed.    Cardiac Enzymes Recent Labs  Lab 01/24/19 0259 01/24/19 0738 01/24/19 0807 01/29/19 1635 01/29/19 1835  TROPONINIHS 132* 111* 113* 791* 718*   Lab Results  Component Value Date   INR 2.9 (H) 02/12/2019   INR 2.8 (H) 02/11/2019   INR 2.9 (H) 02/10/2019   PROTIME 21.1 (A) 06/04/2016   PROTIME 24.6 (A) 06/03/2016   PROTIME 28.8 (A) 06/02/2016     Radiology    No results found.  Cardiac Studies  Echocardiogram 01/24/2019: 1. Left ventricular ejection fraction, by visual estimation, is 45  to 50%. The   left ventricle has mildly decreased function. Normal left ventricular size. Left ventricular septal wall thickness was normal. Normal left ventricular posterior wall thickness. There is no left ventricular hypertrophy. 2. Indeterminate diastolic function due to mechanical mitral valve pattern of LV diastolic filling. 3. Right ventricular pressure overload and right ventricular volume overload. 4. Global right ventricle has moderately reduced systolic function.The right ventricular size is moderately enlarged. No increase in right ventricular wall thickness. 5. Left atrial size was moderately dilated. 6. Right atrial size was severely dilated. 7. The mitral valve has been repaired/replaced. No evidence of mitral valve regurgitation. No evidence of mitral stenosis. 8. The tricuspid valve is normal in structure. Tricuspid valve regurgitation mild-moderate. 9. The aortic valve The aortic valve is normal in structure. Aortic valve regurgitation was not visualized by color flow Doppler. Structurally normal aortic valve, with no evidence of sclerosis or stenosis. 10. The pulmonic valve was normal in structure. Pulmonic valve regurgitation is mild by color flow Doppler. 11. Moderately elevated pulmonary artery systolic pressure. 12. The inferior vena cava is dilated in size with <50% respiratory variability, suggesting right atrial pressure of 15 mmHg.  Patient Profile     68 y.o. female with a history of rheumatic fever/mitral valve stenosis s/p valvuloplasty at [facility] in 2006, CAD s/p CABG, mechanical MVR, paroxysmal afib with MAZE procedure in 2018 on coumadin, PAD s/p endarterectomy in 03/2016 who was admitted on 9/16 for acute heart failurein the setting of left leg cellulitis,complicated by aflutter, now found to have worsening AKI.  Assessment & Plan    1.  Atypical atrial flutter with variable conduction, also history of atrial fibrillation status  post MAZE procedure in  2018.  - CHA2DS2-VASc = 6 (age x 1, female, CVA x 2, HTN, D-CHF) - BP good on current metoprolol dose, 12.5 mg Toprol XL qd - no coumadin in >10 days, but INR still therapeutic  - INR 2.2 on admit>>peak 7.7 on 09/22 - consider TEE/DCCV once otherwise medically stable  2.  Acute on chronic combined heart failure with LVEF 45 to 50%.  - volume mgt w/ HD right now  3.  Acute kidney injury, oliguric and followed by nephrology - temp HD by R IJ catheter - no UOP recorded  4.  Acute on chronic anemia with intermittent hematochezia (INR > 5 on admit - Dr Carlean Purl has seen, no endoscopic eval planned - H&H being followed, Hgb has been <9 since 10/1  5.  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  - on heparin, coumadin on hold - INR still therapeutic  Length of stay: 19   Signed, Rosaria Ferries, PA-C  02/12/2019, 8:17 AM

## 2019-02-12 NOTE — Progress Notes (Signed)
[name]  DTO:[id number] DOB: January 21, 1951 DOA: 01/21/2019 PCP: Marie Alar, NP   Brief Narrative:   Marie Quest Lipfordis a 68 y.o. 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 Patient states over the last 2 weeks patient has been getting progressively short of breath on exertion. Denies any chest pain fever chills or productive cough. Patient's primary care physician advised her to increase her Lasix twice daily which she is supposed to start today. Because of worsening shortness of breath patient came to the ER. EMS was called and patient initially was placed on BiPAP. Patient also had complained of some left calf cramping.  10/1: More interactive today. K+ up ON. 10/2: A little tired today. 10/3: Will wake and interactive with effort. Says legs feel better, but hurts elsewhere. Vague. 10/4: awake, alert this morning. No acute events ON per nursing. 10/5: Seen on HD today. Reports generalized, non-specific pain; exam is unremarkable though   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   S/P CABG x 1   Acute on chronic systolic CHF (congestive heart failure) (HCC)   Atrial fibrillation, chronic (HCC)   Cellulitis of left leg   Acute renal failure (HCC)   Primary biliary cirrhosis (HCC)   Chest pain at rest   H/O mitral valve replacement with mechanical valve   Atypical atrial flutter (HCC)   AKI (acute kidney injury) ([place])   Hematochezia   Supratherapeutic INR   LLE cellulitis - Bld Cx, UCx negative  - vanc/ceftriaxone - narrowed to ancef per  ID - ID c/s due to slow to improve ->improved on ancef - recommending keflex at discharge (9/25) - leg pain is improving; able to manipulate her leg more today - afebrile, WBC normal, abx completed; leg is better     - resolved  LLE Swelling - suspect this is due to above - elevate LE above heart, continue to monitor - given significant LLE edema, more than expected, CT abdomen/pelvis without contrast obtained on 9/28 to eval for mass affecting venous return - notable for cirrhosis, anasarca - see 9/28 CT report - some concern for posterior knee bleed vs compartment syndrome on 9/22 ->but low suspicion for this based on imaging and exam with soft compartments - CT was not suggestive of hematoma, but did have marked skin thickening and subcutaneous edema - LLE [place] from 9/22 read an notable for "significant fluid in posterior calf, unclear etiology" - discussed this with vascular to ensure this did not appear to be fluid collection and he noted appeared c/w edema -pt with good distal pulses, will hold off on vascular c/s - see above  Acute renal failure (baseline Cr 1.0)/ATN NGMA Hyponatremia Hyperkalemia - nephro onboard, appreciate assistance -K+ resolved - daily assessments for HD     - has ATN, watching for renal recovery; nephrology believes significant possibility of needing long term HD     - HD today  Acute respiratory failure with hypoxia - resolved  Chest pain acute started 9/21 Elevated Troponin -Patient borderline hypotensive with MAP 60-63 -EKG: atrial flutter, nonspecific ST-T wave changes no changes from previous  EKG -Troponin high-sensitivity 791 -> 718 - cards following and aware - no plans for additional ischemic workup at this point  Acute on chronic systolic CHF - follow I&O and daily wts  - EF 40-45% - cards consulted - metoprolol XL 12.5mg    Mechanical  mitral valve -Continue warfarin, monitor INR - Therapeutic INR goal 2.5-3.5 - INR remains in therapeutic range     - ok to resume coumadin per cards  Atrial fibrillation chronic -Currently NSR -Anticoagulated for her mechanical mitral valve - Consider DCCV prior to d/c (vs TEE/DCCV if pt INR becomes subtherapeutic)  - per cards, going to discuss with EP given suspicion for a-tach - per cards:Depending on clinical progress may ultimately be considered for a TEE guided cardioversion     - TEE cardioversion deferred until pt more stable  Primary biliary cirrhosis(PBC) -Ursodial 300 mg BID.  - benadryl for itching  Diarrhea -9/22 patient reports diarrhea to cardiology - Fecal fat wnl - GI pathogen panel by PCR negative  - Lactoferrin negative - resolved  Anemia GIB - Hbg stable; monitor - No endoscopy planned; GI has s/o'd  Ongoing vague pain. Exam is normal. Lab work has been stable, with exception of increasing AST. Known primary biliary cirrhosis. Still trying to determine long term need for HD.   DVT prophylaxis: INR > 2.5; coumdin held; ok to resume Code Status: FULL Disposition Plan: TBD  Consultants:   Nephrology  Cardiology   ROS:  Reports non-specific pain. Denies dyspnea, N, V. Remainder 10-pt ROS is negative for all not previously mentioned.  Subjective: "I hurt"  Objective: Vitals:   02/12/19 1100 02/12/19 1130 02/12/19 1149 02/12/19 1443  BP: (!) 102/54 (!) 108/55 (!) 112/50 (!) 92/41  Pulse: 80 79 79 79  Resp:   18   Temp:   97.7 F (36.5 C) 97.6 F (36.4 C)  TempSrc:   Oral Oral  SpO2:   96% 100%  Weight:   73 kg   Height:        Intake/Output Summary (Last 24 hours) at 02/12/2019 1612 Last data filed at 02/12/2019 1149 Gross per 24 hour  Intake -  Output 2010 ml  Net -2010 ml   Filed Weights   02/12/19 0605 02/12/19 0810 02/12/19 1149  Weight: 73.9 kg 76  kg 73 kg    Examination:  General: 68 y.o. female resting in bed in NAD Cardiovascular: RRR, +S1, S2, no m/g/r, equal pulses throughout Respiratory: CTABL, no w/r/r, normal WOB GI: BS+, NDNT, no masses noted, no organomegaly noted MSK: No e/c/c Skin: No rashes, bruises, ulcerations noted Neuro: Alert to name, follows commands   Data Reviewed: I have personally reviewed following labs and imaging studies.  CBC: Recent Labs  Lab 02/05/19 1630  02/08/19 0323  02/09/19 0403  02/10/19 0332  02/11/19 0556 02/11/19 0646 02/11/19 1547 02/11/19 2203 02/12/19 0605 02/12/19 1330  WBC 16.3*   < > 12.3*  --  11.9*  --  10.4  --  9.5  --   --   --  9.2  --   NEUTROABS 15.2*  --   --   --   --   --   --   --   --   --   --   --   --   --   HGB 10.9*   < > 9.0*   < > 8.3*   < > 8.7*   < > 8.2* 8.3* 8.3* 8.0* 8.2* 8.3*  HCT 32.2*   < >  26.4*   < > 24.5*   < > 25.2*   < > 24.9* 24.4* 24.7* 24.4* 24.4* 24.6*  MCV 83.9   < > 85.2  --  84.8  --  83.7  --  88.9  --   --   --  86.2  --   PLT 129*   < > 151  --  214  --  212  --  215  --   --   --  209  --    < > = values in this interval not displayed.   Basic Metabolic Panel: Recent Labs  Lab 02/08/19 0312 02/09/19 0403 02/10/19 0332 02/11/19 0556 02/12/19 0605  NA 128* 131* 133* 135 135  K 6.2* 4.2 3.5 4.0 4.4  CL 97* 97* 98 98 99  CO2 19* 19* 21* 21* 21*  GLUCOSE 73 67* 72 78 81  BUN 98* 100* 66* 74* 76*  CREATININE 3.34* 3.60* 2.87* 3.58* 4.26*  CALCIUM 8.5* 8.6* 8.4* 8.5* 8.7*  MG 2.4 2.3 2.2 2.2 2.4  PHOS 4.6 5.0* 4.1 5.5* 6.4*   GFR: Estimated Creatinine Clearance: 12 mL/min (A) (by C-G formula based on SCr of 4.26 mg/dL (H)). Liver Function Tests: Recent Labs  Lab 02/09/19 0403 02/10/19 0332 02/11/19 0556 02/12/19 0605 02/12/19 1330  AST  --   --   --   --  152*  ALT  --   --   --   --  <5  ALKPHOS  --   --   --   --  67  BILITOT  --   --   --   --  2.2*  PROT  --   --   --   --  9.1*  ALBUMIN 2.3* 2.3* 2.2*  2.1* 2.2*   No results for input(s): LIPASE, AMYLASE in the last 168 hours. Recent Labs  Lab 02/10/19 1124  AMMONIA 34   Coagulation Profile: Recent Labs  Lab 02/09/19 0403 02/10/19 0332 02/10/19 1811 02/11/19 0645 02/12/19 0605  INR 3.1* 2.7* 2.9* 2.8* 2.9*   Cardiac Enzymes: No results for input(s): CKTOTAL, CKMB, CKMBINDEX, TROPONINI in the last 168 hours. BNP (last 3 results) No results for input(s): PROBNP in the last 8760 hours. HbA1C: No results for input(s): HGBA1C in the last 72 hours. CBG: No results for input(s): GLUCAP in the last 168 hours. Lipid Profile: No results for input(s): CHOL, HDL, LDLCALC, TRIG, CHOLHDL, LDLDIRECT in the last 72 hours. Thyroid Function Tests: No results for input(s): TSH, T4TOTAL, FREET4, T3FREE, THYROIDAB in the last 72 hours. Anemia Panel: Recent Labs    02/12/19 1330  FERRITIN 699*  TIBC 232*  IRON 96   Sepsis Labs: No results for input(s): PROCALCITON, LATICACIDVEN in the last 168 hours.  No results found for this or any previous visit (from the past 240 hour(s)).    Radiology Studies: No results found.   Scheduled Meds: . atorvastatin  40 mg Oral q1800  . Chlorhexidine Gluconate Cloth  6 each Topical Q0600  . darbepoetin (ARANESP) injection - DIALYSIS  60 mcg Intravenous Q Mon-HD  . feeding supplement (ENSURE ENLIVE)  237 mL Oral BID BM  . heparin      . metoprolol succinate  12.5 mg Oral Daily  . pantoprazole  40 mg Oral Daily  . ursodiol  300 mg Oral BID   Continuous Infusions:   LOS: 19 days    Time spent: 25 minutes spent in the coordination of care today.  Jonnie Finner, DO [facility] Pager [phone]  If 7PM-7AM, please contact night-coverage [email or web address] Password [email or web address] 02/12/2019, 4:12 PM

## 2019-02-13 ENCOUNTER — Ambulatory Visit: Payer: Medicare Other | Admitting: Family

## 2019-02-13 LAB — CBC
HCT: 23 % — ABNORMAL LOW (ref 36.0–46.0)
Hemoglobin: 7.5 g/dL — ABNORMAL LOW (ref 12.0–15.0)
MCH: 28.8 pg (ref 26.0–34.0)
MCHC: 32.6 g/dL (ref 30.0–36.0)
MCV: 88.5 fL (ref 80.0–100.0)
Platelets: 154 10*3/uL (ref 150–400)
RBC: 2.6 MIL/uL — ABNORMAL LOW (ref 3.87–5.11)
RDW: 21 % — ABNORMAL HIGH (ref 11.5–15.5)
WBC: 8 10*3/uL (ref 4.0–10.5)
nRBC: 0.6 % — ABNORMAL HIGH (ref 0.0–0.2)

## 2019-02-13 LAB — COMPREHENSIVE METABOLIC PANEL
ALT: 5 U/L (ref 0–44)
AST: 153 U/L — ABNORMAL HIGH (ref 15–41)
Albumin: 2 g/dL — ABNORMAL LOW (ref 3.5–5.0)
Alkaline Phosphatase: 61 U/L (ref 38–126)
Anion gap: 13 (ref 5–15)
BUN: 41 mg/dL — ABNORMAL HIGH (ref 8–23)
CO2: 23 mmol/L (ref 22–32)
Calcium: 8.1 mg/dL — ABNORMAL LOW (ref 8.9–10.3)
Chloride: 98 mmol/L (ref 98–111)
Creatinine, Ser: 3.21 mg/dL — ABNORMAL HIGH (ref 0.44–1.00)
GFR calc Af Amer: 16 mL/min — ABNORMAL LOW (ref >60)
GFR calc non Af Amer: 14 mL/min — ABNORMAL LOW (ref >60)
Glucose, Bld: 108 mg/dL — ABNORMAL HIGH (ref 70–99)
Potassium: 3.3 mmol/L — ABNORMAL LOW (ref 3.5–5.1)
Sodium: 134 mmol/L — ABNORMAL LOW (ref 135–145)
Total Bilirubin: 1.7 mg/dL — ABNORMAL HIGH (ref 0.3–1.2)
Total Protein: 8.8 g/dL — ABNORMAL HIGH (ref 6.5–8.1)

## 2019-02-13 LAB — PROTIME-INR
INR: 2.9 — ABNORMAL HIGH (ref 0.8–1.2)
Prothrombin Time: 29.6 seconds — ABNORMAL HIGH (ref 11.4–15.2)

## 2019-02-13 LAB — HEMOGLOBIN AND HEMATOCRIT, BLOOD
HCT: 25 % — ABNORMAL LOW (ref 36.0–46.0)
HCT: 25.6 % — ABNORMAL LOW (ref 36.0–46.0)
HCT: 25.1 % — ABNORMAL LOW (ref 36.0–46.0)
Hemoglobin: 8.6 g/dL — ABNORMAL LOW (ref 12.0–15.0)
Hemoglobin: 8.3 g/dL — ABNORMAL LOW (ref 12.0–15.0)
Hemoglobin: 8.3 g/dL — ABNORMAL LOW (ref 12.0–15.0)

## 2019-02-13 LAB — MAGNESIUM: Magnesium: 2 mg/dL (ref 1.7–2.4)

## 2019-02-13 MED ORDER — DARBEPOETIN ALFA 60 MCG/0.3ML IJ SOSY
60.0000 ug | PREFILLED_SYRINGE | INTRAMUSCULAR | Status: DC
  Filled 2019-02-13: qty 0.3

## 2019-02-13 MED ORDER — POTASSIUM CHLORIDE CRYS ER 20 MEQ PO TBCR
20.0000 meq | EXTENDED_RELEASE_TABLET | Freq: Once | ORAL | Status: AC
  Administered 2019-02-13: 20 meq via ORAL
  Filled 2019-02-13: qty 1

## 2019-02-13 MED ORDER — WARFARIN SODIUM 1 MG PO TABS
1.0000 mg | ORAL_TABLET | Freq: Once | ORAL | Status: AC
  Administered 2019-02-13: 1 mg via ORAL
  Filled 2019-02-13: qty 1

## 2019-02-13 MED ORDER — WARFARIN - PHARMACIST DOSING INPATIENT: Freq: Every day | Status: DC

## 2019-02-13 NOTE — Progress Notes (Signed)
Patient ID: Marie Malone, female   DOB: Jul 23, 1950, 68 y.o.   MRN: [id number] [facility] Progress Note   Assessment/ Plan:   1.  Acute kidney injury:  Some urine output overnight-remains oliguric.  Differential diagnosis include ATN and acute interstitial nephritis (AIN) with available labs favoring hemodynamically mediated AKI/ATN.  Renal ultrasound negative for any obstruction and abdominal/pelvic imaging showing ascites/abdominal wall edema.  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   - HD per MWF schedule for now as needed.  Monitor for renal recovery  - baseline Cr 1 - bladder scan and place foley if retention  - Would avoid hypotension; noted metoprolol is for atypical a flutter  2.  Hypoxic respiratory failure: Secondary to acute exacerbation of diastolic heart failure in this patient with pulmonary hypertension.  Optimize volume with HD as tolerated    3.  Left leg cellulitis:  Status post completion of antibiotic course  4.  Anemia: Possibly secondary to chronic disease and recent acute loss.    Hematochezia appears to have resolved and hemoglobin/hematocrit appear stable. Update iron panel.  aranesp ordered 10/5 but patient did not receive.  Ordered for 10/7 with HD  5.  History of mechanical mitral valve: On anticoagulation per primary team. Hx supratherapeutic INR  6.  History of primary biliary cirrhosis   7. Atypical flutter  - on metoprolol per cardiology   8. Acute on chronic combined CHF  - optimize volume status with HD as able   Subjective:   No urine output is charted for 10/5.  Had 2 kg UF with HD on 10/5.   Review of systems:   Reports shortness of breath; no CP denies nausea, no vomiting    Objective:   BP (!) 92/50 (BP Location: Right Arm)   Pulse 79   Temp 98.2 F (36.8 C) (Oral)   Resp 14   Ht  5\' 2"  (1.575 m)   Wt 73 kg   SpO2 98%   BMI 29.44 kg/m   Intake/Output Summary (Last 24 hours) at 02/13/2019 1342 Last data filed at 02/13/2019 1000 Gross per 24 hour  Intake 300 ml  Output -  Net 300 ml   Weight change: 2.064 kg  Physical Exam:  Gen: adult female in bed in NAD  CVS: Pulse regular rhythm, normal rate, normal S1 and S2 Resp: clear anteriorly and unlabored  Abd: Soft, slightly distended, bowel sounds normal Ext: 1+ edema lower extremities  Access: RIJ nontunneled catheter   Imaging: No results found.  Labs: BMET Recent Labs  Lab 02/07/19 0352 02/08/19 5732 02/09/19 0403 02/10/19 0332 02/11/19 0556 02/12/19 0605 02/13/19 0334  NA 130* 128* 131* 133* 135 135 134*  K 5.3* 6.2* 4.2 3.5 4.0 4.4 3.3*  CL 98 97* 97* 98 98 99 98  CO2 20* 19* 19* 21* 21* 21* 23  GLUCOSE 66* 73 67* 72 78 81 108*  BUN 92* 98* 100* 66* 74* 76* 41*  CREATININE 3.21* 3.34* 3.60* 2.87* 3.58* 4.26* 3.21*  CALCIUM 8.4* 8.5* 8.6* 8.4* 8.5* 8.7* 8.1*  PHOS 3.8 4.6 5.0* 4.1 5.5* 6.4*  --    CBC Recent Labs  Lab 02/10/19 0332  02/11/19 0556  02/12/19 0605 02/12/19 1330 02/12/19 1951 02/13/19 0334 02/13/19 0928  WBC 10.4  --  9.5  --  9.2  --   --  8.0  --   HGB 8.7*   < >  8.2*   < > 8.2* 8.3* 7.6* 7.5* 8.6*  HCT 25.2*   < > 24.9*   < > 24.4* 24.6* 23.4* 23.0* 25.0*  MCV 83.7  --  88.9  --  86.2  --   --  88.5  --   PLT 212  --  215  --  209  --   --  154  --    < > = values in this interval not displayed.    Medications:    . atorvastatin  40 mg Oral q1800  . Chlorhexidine Gluconate Cloth  6 each Topical Q0600  . darbepoetin (ARANESP) injection - DIALYSIS  60 mcg Intravenous Q Mon-HD  . feeding supplement (ENSURE ENLIVE)  237 mL Oral BID BM  . metoprolol succinate  12.5 mg Oral Daily  . pantoprazole  40 mg Oral Daily  . ursodiol  300 mg Oral BID  . warfarin  1 mg Oral ONCE-1800  . Warfarin - Pharmacist Dosing Inpatient   Does not apply q1800   Claudia Desanctis  02/13/2019, 1:42 PM

## 2019-02-13 NOTE — Progress Notes (Signed)
Per Nephrologist, Foley is to be insterted versus I/o attempts.

## 2019-02-13 NOTE — Plan of Care (Signed)

## 2019-02-13 NOTE — Care Management Important Message (Signed)
Important Message  Patient Details  Name: FUMIYE LUBBEN MRN: [id number] Date of Birth: [birth year]   Medicare Important Message Given:  Yes     Shelda Altes 02/13/2019, 3:51 PM

## 2019-02-13 NOTE — Progress Notes (Signed)
[name]  MEB:[id number] DOB: 03/30/1951 DOA: 01/25/2019 PCP: Debbrah Alar, NP   Brief Narrative:   Marie Malone a 68 y.o. 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 Patient states over the last 2 weeks patient has been getting progressively short of breath on exertion. Denies any chest pain fever chills or productive cough. Patient's primary care physician advised her to increase her Lasix twice daily which she is supposed to start today. Because of worsening shortness of breath patient came to the ER. EMS was called and patient initially was placed on BiPAP. Patient also had complained of some left calf cramping.  10/1: More interactive today. K+ up ON. 10/2: A little tired today. 10/3: Will wake and interactive with effort. Says legs feel better, but hurts elsewhere. Vague. 10/4: awake, alert this morning. No acute events ON per nursing. 10/5: Seen on HD today. 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   S/P CABG x 1   Acute on chronic systolic CHF (congestive heart failure) (HCC)   Atrial fibrillation, chronic (HCC)   Cellulitis of left leg   Acute renal failure (HCC)   Primary biliary cirrhosis (HCC)   Chest pain at rest   H/O mitral valve replacement with mechanical valve   Atypical atrial flutter (HCC)   AKI (acute kidney injury) ([place])   Hematochezia   Supratherapeutic INR   LLE cellulitis  - Bld Cx, UCx negative  - vanc/ceftriaxone - narrowed to ancef per ID - ID c/s due to slow to improve ->improved on ancef - recommending keflex at discharge (9/25) - leg pain is improving; able to manipulate her leg more today - afebrile, WBC normal, abx completed; leg is better     - resolved  LLE Swelling - suspect this is due to above - elevate LE above heart, continue to monitor - given significant LLE edema, more than expected, CT abdomen/pelvis without contrast obtained on 9/28 to eval for mass affecting venous return - notable for cirrhosis, anasarca - see 9/28 CT report - some concern for posterior knee bleed vs compartment syndrome on 9/22 ->but low suspicion for this based on imaging and exam with soft compartments - CT was not suggestive of hematoma, but did have marked skin thickening and subcutaneous edema - LLE [place] from 9/22 read an notable for "significant fluid in posterior calf, unclear etiology" - discussed this with vascular to ensure this did not appear to be fluid collection and he noted appeared c/w edema -pt with good distal pulses, will hold off on vascular c/s - see above  Acute renal failure (baseline Cr 1.0)/ATN NGMA Hyponatremia Hyperkalemia - nephro onboard, appreciate assistance -K+ resolved -daily assessments for HD - has ATN, watching for renal recovery; nephrology believes significant possibility of needing long term HD     - HD is scheduled MWF at this point  Acute respiratory failure with hypoxia - resolved  Chest pain acute started 9/21 Elevated Troponin -Patient borderline hypotensive with MAP 60-63 -EKG:  atrial flutter, nonspecific ST-T wave changes no changes from previous EKG -Troponin high-sensitivity 791 -> 718 - cards following and aware - no plans for additional ischemic workup at this point  Acute on chronic systolic CHF - follow I&O  and daily wts  - EF 40-45% - cards consulted - metoprolol XL 12.5mg    Mechanical mitral valve -Continue warfarin, monitor INR - Therapeutic INR goal 2.5-3.5 -INR remains in therapeutic range     - resumed coumadin  Atrial fibrillation chronic -Currently NSR -Anticoagulated for her mechanical mitral valve - Consider DCCV prior to d/c (vs TEE/DCCV if pt INR becomes subtherapeutic)  - per cards, going to discuss with EP given suspicion for a-tach - per cards:Depending on clinical progress may ultimately be considered for a TEE guided cardioversion - TEE cardioversion deferred until pt more stable     - coumadin resumed per pharm  Primary biliary cirrhosis(PBC) -Ursodial 300 mg BID.  - benadryl for itching  Diarrhea -9/22 patient reports diarrhea to cardiology - Fecal fat wnl - GI pathogen panel by PCR negative  - Lactoferrin negative - resolved  Anemia GIB - Hbg stable; monitor -No endoscopy planned; GI has s/o'd  Lab work improved after dialysis. She's more alert today and c/o of pain, but not a specific area and again, exam is really unrevealing. Continue current course. HD scheduled MWF.   DVT prophylaxis:coumdin Code Status:FULL Disposition Plan:TBD  Consultants:  Nephrology  Cardiology  ROS: Denies CP, dyspnea, N, V. Remainder 10-pt ROS is negative for all not previously mentioned.  Subjective: No acute events ON.   Objective: Vitals:   02/12/19 2132 02/13/19 0152 02/13/19 0426 02/13/19 1422  BP: (!) 99/49 (!) 105/48 (!) 92/50 (!) 98/52  Pulse:    82  Resp: 14 11 14 12   Temp:   98.2 F (36.8 C) 97.7 F (36.5 C)  TempSrc:   Oral Oral  SpO2:   98%   Weight:   73 kg   Height:        Intake/Output Summary (Last 24 hours) at 02/13/2019 1458 Last data filed at 02/13/2019 1000 Gross per 24 hour  Intake 300 ml  Output -  Net 300 ml   Filed  Weights   02/12/19 0810 02/12/19 1149 02/13/19 0426  Weight: 76 kg 73 kg 73 kg    Examination: General: 68 y.o. female resting in bed in NAD Cardiovascular: RRR, +S1, S2, no m/g/r, equal pulses throughout Respiratory: CTABL, no w/r/r, normal WOB GI: BS+, NDNT, no masses noted, no organomegaly noted MSK: No e/c/c Skin: No rashes, bruises, ulcerations noted Neuro: Alert to name, follows commands  Data Reviewed: I have personally reviewed following labs and imaging studies.  CBC: Recent Labs  Lab 02/09/19 0403  02/10/19 0332  02/11/19 0556  02/12/19 0605 02/12/19 1330 02/12/19 1951 02/13/19 0334 02/13/19 0928  WBC 11.9*  --  10.4  --  9.5  --  9.2  --   --  8.0  --   HGB 8.3*   < > 8.7*   < > 8.2*   < > 8.2* 8.3* 7.6* 7.5* 8.6*  HCT 24.5*   < > 25.2*   < > 24.9*   < > 24.4* 24.6* 23.4* 23.0* 25.0*  MCV 84.8  --  83.7  --  88.9  --  86.2  --   --  88.5  --   PLT 214  --  212  --  215  --  209  --   --  154  --    < > = values in this interval not displayed.   Basic Metabolic Panel: Recent Labs  Lab 02/08/19 0312 02/09/19 0403 02/10/19 0332 02/11/19 0556 02/12/19 0605 02/13/19 0334  NA 128* 131* 133* 135 135 134*  K 6.2* 4.2 3.5 4.0 4.4 3.3*  CL 97* 97* 98 98 99 98  CO2 19* 19* 21* 21* 21* 23  GLUCOSE 73 67* 72 78 81 108*  BUN 98* 100* 66* 74* 76* 41*  CREATININE 3.34* 3.60* 2.87* 3.58* 4.26* 3.21*  CALCIUM 8.5* 8.6* 8.4* 8.5* 8.7* 8.1*  MG 2.4 2.3 2.2 2.2 2.4 2.0  PHOS 4.6 5.0* 4.1 5.5* 6.4*  --    GFR: Estimated Creatinine Clearance: 15.9 mL/min (A) (by C-G formula based on SCr of 3.21 mg/dL (H)). Liver Function Tests: Recent Labs  Lab 02/10/19 0332 02/11/19 0556 02/12/19 0605 02/12/19 1330 02/13/19 0334  AST  --   --   --  152* 153*  ALT  --   --   --  <5 5  ALKPHOS  --   --   --  67 61  BILITOT  --   --   --  2.2* 1.7*  PROT  --   --   --  9.1* 8.8*  ALBUMIN 2.3* 2.2* 2.1* 2.2* 2.0*   No results for input(s): LIPASE, AMYLASE in the last 168 hours.  Recent Labs  Lab 02/10/19 1124  AMMONIA 34   Coagulation Profile: Recent Labs  Lab 02/10/19 0332 02/10/19 1811 02/11/19 0645 02/12/19 0605 02/13/19 0334  INR 2.7* 2.9* 2.8* 2.9* 2.9*   Cardiac Enzymes: No results for input(s): CKTOTAL, CKMB, CKMBINDEX, TROPONINI in the last 168 hours. BNP (last 3 results) No results for input(s): PROBNP in the last 8760 hours. HbA1C: No results for input(s): HGBA1C in the last 72 hours. CBG: No results for input(s): GLUCAP in the last 168 hours. Lipid Profile: No results for input(s): CHOL, HDL, LDLCALC, TRIG, CHOLHDL, LDLDIRECT in the last 72 hours. Thyroid Function Tests: No results for input(s): TSH, T4TOTAL, FREET4, T3FREE, THYROIDAB in the last 72 hours. Anemia Panel: Recent Labs    02/12/19 1330  FERRITIN 699*  TIBC 232*  IRON 96   Sepsis Labs: No results for input(s): PROCALCITON, LATICACIDVEN in the last 168 hours.  No results found for this or any previous visit (from the past 240 hour(s)).    Radiology Studies: No results found.   Scheduled Meds: . atorvastatin  40 mg Oral q1800  . Chlorhexidine Gluconate Cloth  6 each Topical Q0600  . [START ON 02/14/2019] darbepoetin (ARANESP) injection - DIALYSIS  60 mcg Intravenous Q Wed-HD  . feeding supplement (ENSURE ENLIVE)  237 mL Oral BID BM  . metoprolol succinate  12.5 mg Oral Daily  . pantoprazole  40 mg Oral Daily  . ursodiol  300 mg Oral BID  . warfarin  1 mg Oral ONCE-1800  . Warfarin - Pharmacist Dosing Inpatient   Does not apply q1800   Continuous Infusions:   LOS: 20 days    Time spent: 25 minutes spent in the coordination of care today.    Jonnie Finner, DO [facility] Pager [phone]  If 7PM-7AM, please contact night-coverage [email or web address] Password [email or web address] 02/13/2019, 2:58 PM

## 2019-02-13 NOTE — Progress Notes (Signed)
Pt has small amount of o/p in purewick container. Bladder scan shows 283, notified md. Would like to do 3/ in out caths prior to foley insertion.

## 2019-02-13 NOTE — Progress Notes (Signed)
Progress Note  Patient Name: Marie Malone Date of Encounter: 02/13/2019  Primary Cardiologist: [name], MD  Subjective   A little more awake than yesterday. Says feels rough, no other details on further questioning.   Inpatient Medications    Scheduled Meds: . atorvastatin  40 mg Oral q1800  . Chlorhexidine Gluconate Cloth  6 each Topical Q0600  . darbepoetin (ARANESP) injection - DIALYSIS  60 mcg Intravenous Q Mon-HD  . feeding supplement (ENSURE ENLIVE)  237 mL Oral BID BM  . metoprolol succinate  12.5 mg Oral Daily  . pantoprazole  40 mg Oral Daily  . ursodiol  300 mg Oral BID  . warfarin  1 mg Oral ONCE-1800  . Warfarin - Pharmacist Dosing Inpatient   Does not apply q1800    PRN Meds: acetaminophen, diphenhydrAMINE, docusate sodium, lidocaine (PF), ondansetron **OR** ondansetron (ZOFRAN) IV, oxyCODONE **OR** oxyCODONE   Vital Signs    Vitals:   02/12/19 2115 02/12/19 2132 02/13/19 0152 02/13/19 0426  BP: (!) 100/45 (!) 99/49 (!) 105/48 (!) 92/50  Pulse: 79     Resp: 13 14 11 14   Temp: 97.8 F (36.6 C)   98.2 F (36.8 C)  TempSrc: Oral   Oral  SpO2: 100%   98%  Weight:    73 kg  Height:        Intake/Output Summary (Last 24 hours) at 02/13/2019 0827 Last data filed at 02/12/2019 2140 Gross per 24 hour  Intake 200 ml  Output 2010 ml  Net -1810 ml   Filed Weights   02/12/19 0810 02/12/19 1149 02/13/19 0426  Weight: 76 kg 73 kg 73 kg    Telemetry    atyp atrial flutter w/ 2:1 conduction, occ PVCs.  Personally reviewed.  ECG    An ECG dated 02/09/2019 was personally reviewed today and demonstrated:  Atypical atrial flutter with variable conduction.  Physical Exam   General: Well developed, elderly, female in no acute distress Head: Eyes PERRLA Normocephalic and atraumatic Lungs: few rales bilaterally to auscultation. Heart: HRRR S1 S2, without rub or gallop. Crisp valve click and 0-9/6 murmur.  Pulses are 2+ & equal. No JVD. Abdomen:  Bowel sounds are present, abdomen soft and non-tender without masses or  hernias noted. Msk: weak strength and tone for age. Extremities: No clubbing, cyanosis, trace LLE edema.    Skin:  No rashes or lesions noted. Neuro: Alert and oriented X 1.  Labs    Chemistry Recent Labs  Lab 02/11/19 0556 02/12/19 0605 02/12/19 1330 02/13/19 0334  NA 135 135  --  134*  K 4.0 4.4  --  3.3*  CL 98 99  --  98  CO2 21* 21*  --  23  GLUCOSE 78 81  --  108*  BUN 74* 76*  --  41*  CREATININE 3.58* 4.26*  --  3.21*  CALCIUM 8.5* 8.7*  --  8.1*  PROT  --   --  9.1* 8.8*  ALBUMIN 2.2* 2.1* 2.2* 2.0*  AST  --   --  152* 153*  ALT  --   --  <5 5  ALKPHOS  --   --  67 61  BILITOT  --   --  2.2* 1.7*  GFRNONAA 12* 10*  --  14*  GFRAA 14* 12*  --  16*  ANIONGAP 16* 15  --  13     Hematology Recent Labs  Lab 02/11/19 0556  02/12/19 0605 02/12/19 1330 02/12/19 1951 02/13/19 0334  WBC 9.5  --  9.2  --   --  8.0  RBC 2.80*  --  2.83*  --   --  2.60*  HGB 8.2*   < > 8.2* 8.3* 7.6* 7.5*  HCT 24.9*   < > 24.4* 24.6* 23.4* 23.0*  MCV 88.9  --  86.2  --   --  88.5  MCH 29.3  --  29.0  --   --  28.8  MCHC 32.9  --  33.6  --   --  32.6  RDW 19.8*  --  20.3*  --   --  21.0*  PLT 215  --  209  --   --  154   < > = values in this interval not displayed.    Cardiac Enzymes Recent Labs  Lab 01/24/19 0259 01/24/19 0738 01/24/19 0807 01/29/19 1635 01/29/19 1835  TROPONINIHS 132* 111* 113* 791* 718*   Lab Results  Component Value Date   INR 2.9 (H) 02/13/2019   INR 2.9 (H) 02/12/2019   INR 2.8 (H) 02/11/2019   PROTIME 21.1 (A) 06/04/2016   PROTIME 24.6 (A) 06/03/2016   PROTIME 28.8 (A) 06/02/2016     Radiology    No results found.  Cardiac Studies  Echocardiogram 01/24/2019: 1. Left ventricular ejection fraction, by visual estimation, is 45 to 50%. The   left ventricle has mildly decreased function. Normal left ventricular size. Left ventricular septal wall thickness was  normal. Normal left ventricular posterior wall thickness. There is no left ventricular hypertrophy. 2. Indeterminate diastolic function due to mechanical mitral valve pattern of LV diastolic filling. 3. Right ventricular pressure overload and right ventricular volume overload. 4. Global right ventricle has moderately reduced systolic function.The right ventricular size is moderately enlarged. No increase in right ventricular wall thickness. 5. Left atrial size was moderately dilated. 6. Right atrial size was severely dilated. 7. The mitral valve has been repaired/replaced. No evidence of mitral valve regurgitation. No evidence of mitral stenosis. 8. The tricuspid valve is normal in structure. Tricuspid valve regurgitation mild-moderate. 9. The aortic valve The aortic valve is normal in structure. Aortic valve regurgitation was not visualized by color flow Doppler. Structurally normal aortic valve, with no evidence of sclerosis or stenosis. 10. The pulmonic valve was normal in structure. Pulmonic valve regurgitation is mild by color flow Doppler. 11. Moderately elevated pulmonary artery systolic pressure. 12. The inferior vena cava is dilated in size with <50% respiratory variability, suggesting right atrial pressure of 15 mmHg.  Patient Profile     68 y.o. female with a history of rheumatic fever/mitral valve stenosis s/p valvuloplasty at [facility] in 2006, CAD s/p CABG, mechanical MVR, paroxysmal afib with MAZE procedure in 2018 on coumadin, PAD s/p endarterectomy in 03/2016 who was admitted on 9/16 for acute heart failurein the setting of left leg cellulitis,complicated by aflutter, now found to have worsening AKI.  Assessment & Plan    1.  Atypical atrial flutter with variable conduction, also history of atrial fibrillation status post MAZE procedure in 2018.  - CHA2DS2-VASc = 6 (age x 1, female, CVA x 2, HTN, D-CHF) - continue Toprol XL 12.5 mg qd - INR currently therapeutic despite no  coumadin in 10 days, per pharmacy, she is to get 1 mg today - INR 2.2 on admit, but went to 7.7 on 09/22 - rate control for now, consider TEE/DCCV later, once medically stable  2.  Acute on chronic combined heart failure with LVEF 45 to 50%.  - volume  stable, continue HD - BP too low to add other CHF meds  3.  Acute kidney injury, oliguric and followed by nephrology - 2 L off by HD 10/05 - no UOP recorded  4.  Acute on chronic anemia with intermittent hematochezia (INR > 5 on admit - GI plans no eval - follow H&H per IM, Hgb < 9 since 10/01  5.  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  - goal INR 2.5-3.5 - no heparin needed right now since INR > 2.5  Length of stay: 20 days   Signed, Rosaria Ferries, PA-C  02/13/2019, 8:27 AM

## 2019-02-13 NOTE — Progress Notes (Signed)
Physical Therapy Treatment Patient Details Name: Marie Malone MRN: [id number] DOB: 1950-07-08 Today's Date: 02/13/2019    History of Present Illness Marie Malone is a 68 y.o. 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   Work up includes acute respiratory failure with hypoxia, A/C CHF, and L LE pain due to cellulitis vs SIRS.    PT Comments    Pt very sleepy and lethargic. Reports her name and states she's in the hospital however doesn't maintain eye opening or engage in conversation. Pt requiring maxA for all mobility and is unable to transfer OOB or ambulate at this time. Acute PT to cont to follow.   Follow Up Recommendations  SNF     Equipment Recommendations  None recommended by PT    Recommendations for Other Services       Precautions / Restrictions Precautions Precautions: Fall Precaution Comments: lethargic Restrictions Weight Bearing Restrictions: No    Mobility  Bed Mobility Overal bed mobility: Needs Assistance Bed Mobility: Sit to Supine;Supine to Sit     Supine to sit: Max assist Sit to supine: Max assist   General bed mobility comments: pt initiated LE movement towards EOB however required assist to bring completely off bed, maxA for trunk elevation, required minA to maintain EOB balance, pt uanble to hold head up, pt with forward lean  Transfers                 General transfer comment: unable to complete stand this date due to lethargy  Ambulation/Gait             General Gait Details: unable this date   Stairs             Wheelchair Mobility    Modified Rankin (Stroke Patients Only)       Balance Overall balance assessment: Needs assistance Sitting-balance support: Bilateral upper extremity supported;Feet supported Sitting balance-Leahy Scale:  Poor Sitting balance - Comments: pt with increased cervical and trunk flexion with anterior lean requiring assist to maintain upright and midline position, pt with no self correction                                    Cognition Arousal/Alertness: Lethargic Behavior During Therapy: Flat affect Overall Cognitive Status: Impaired/Different from baseline Area of Impairment: Orientation;Attention;Following commands;Problem solving                 Orientation Level: Disoriented to;Time;Situation Current Attention Level: Focused   Following Commands: Follows one step commands with increased time;Follows one step commands inconsistently     Problem Solving: Slow processing;Decreased initiation;Difficulty sequencing;Requires verbal cues;Requires tactile cues General Comments: pt with minimal eye opening, difficulty falling asleep, stated she was in the hospital, very delayed processing, no initiation of task      Exercises      General Comments General comments (skin integrity, edema, etc.): VSS      Pertinent Vitals/Pain Pain Assessment: Faces Faces Pain Scale: No hurt    Home Living                      Prior Function            PT Goals (current goals can now be found in the care plan section) Progress towards PT  goals: Not progressing toward goals - comment    Frequency    Min 3X/week      PT Plan Current plan remains appropriate    Co-evaluation              AM-PAC PT "6 Clicks" Mobility   Outcome Measure  Help needed turning from your back to your side while in a flat bed without using bedrails?: Total Help needed moving from lying on your back to sitting on the side of a flat bed without using bedrails?: Total Help needed moving to and from a bed to a chair (including a wheelchair)?: Total Help needed standing up from a chair using your arms (e.g., wheelchair or bedside chair)?: Total Help needed to walk in hospital room?:  Total Help needed climbing 3-5 steps with a railing? : Total 6 Click Score: 6    End of Session   Activity Tolerance: Patient limited by lethargy Patient left: in bed;with call bell/phone within reach;with nursing/sitter in room Nurse Communication: Mobility status PT Visit Diagnosis: Other abnormalities of gait and mobility (R26.89);Pain Pain - Right/Left: Left Pain - part of body: Leg     Time: 2449-7530 PT Time Calculation (min) (ACUTE ONLY): 21 min  Charges:  $Therapeutic Activity: 8-22 mins                     Kittie Plater, PT, DPT Acute Rehabilitation Services Pager #: [phone] Office #: [phone]    Berline Lopes 02/13/2019, 2:22 PM

## 2019-02-13 NOTE — Plan of Care (Signed)
  Problem: Education: Goal: Knowledge of General Education information will improve Description: Including pain rating scale, medication(s)/side effects and non-pharmacologic comfort measures Outcome: Progressing   Problem: Clinical Measurements: Goal: Will remain free from infection Outcome: Progressing Goal: Diagnostic test results will improve Outcome: Progressing   Problem: Pain Managment: Goal: General experience of comfort will improve Outcome: Progressing   Problem: Elimination: Goal: Will not experience complications related to bowel motility Outcome: Adequate for Discharge

## 2019-02-13 NOTE — Progress Notes (Addendum)
ANTICOAGULATION CONSULT NOTE  Pharmacy Consult for warfarin to heparin Indication: atrial fibrillation and mechanical MVR  Vital Signs: Temp: 98.2 F (36.8 C) (10/06 0426) Temp Source: Oral (10/06 0426) BP: 92/50 (10/06 0426) Pulse Rate: 79 (10/05 2115)  Labs: Recent Labs    02/11/19 0556 02/11/19 0645  02/12/19 0605 02/12/19 1330 02/12/19 1951 02/13/19 0334  HGB 8.2*  --    < > 8.2* 8.3* 7.6* 7.5*  HCT 24.9*  --    < > 24.4* 24.6* 23.4* 23.0*  PLT 215  --   --  209  --   --  154  LABPROT  --  29.3*  --  29.7*  --   --  29.6*  INR  --  2.8*  --  2.9*  --   --  2.9*  CREATININE 3.58*  --   --  4.26*  --   --  3.21*   < > = values in this interval not displayed.    Estimated Creatinine Clearance: 15.9 mL/min (A) (by C-G formula based on SCr of 3.21 mg/dL (H)).  Assessment: 68 y.o. female admitted with CHF/SOB, h/o Afib and mechanical MVR, to continue warfarin. PTA regimen is 4 mg daily, noted prior goal 2-3 has been adjusted to 2.5-3.5 this admit by cardiology. INR has been elevated likely due to vitamin K deficiency. Vitamin K given 9/22 and again 9/29 and 9/30.  INR this morning remains 2.9 which is within goal range. Pt has not received warfarin since 9/25 so unclear etiology of continued elevated INR - AST slightly elevated, other LFTs wnl. Discussed with attending and cardiology - will cautiously resume warfarin tonight. Prior home regimen 4mg  daily - will give 1mg  tonight and assess response.  Goal of Therapy:  INR 2.5-3.5 Monitor platelets by anticoagulation protocol: Yes    Plan:  -Warfarin 1mg  PO x1 tonight -Daily INR, CBC -Watch S/Sx bleeding closely -Start IV heparin gtt if INR < 2.5   Arrie Senate, PharmD, BCPS Clinical Pharmacist [phone] Please check AMION for all [facility] numbers 02/13/2019

## 2019-02-14 LAB — CBC
HCT: 25.6 % — ABNORMAL LOW (ref 36.0–46.0)
HCT: 25.6 % — ABNORMAL LOW (ref 36.0–46.0)
Hemoglobin: 8.3 g/dL — ABNORMAL LOW (ref 12.0–15.0)
Hemoglobin: 8.3 g/dL — ABNORMAL LOW (ref 12.0–15.0)
MCH: 28.7 pg (ref 26.0–34.0)
MCH: 29 pg (ref 26.0–34.0)
MCHC: 32.4 g/dL (ref 30.0–36.0)
MCHC: 32.4 g/dL (ref 30.0–36.0)
MCV: 88.6 fL (ref 80.0–100.0)
MCV: 89.5 fL (ref 80.0–100.0)
Platelets: 170 10*3/uL (ref 150–400)
Platelets: 174 10*3/uL (ref 150–400)
RBC: 2.89 MIL/uL — ABNORMAL LOW (ref 3.87–5.11)
RBC: 2.86 MIL/uL — ABNORMAL LOW (ref 3.87–5.11)
RDW: 21.6 % — ABNORMAL HIGH (ref 11.5–15.5)
RDW: 21.3 % — ABNORMAL HIGH (ref 11.5–15.5)
WBC: 8.8 10*3/uL (ref 4.0–10.5)
WBC: 9.7 10*3/uL (ref 4.0–10.5)
nRBC: 0.8 % — ABNORMAL HIGH (ref 0.0–0.2)
nRBC: 0.9 % — ABNORMAL HIGH (ref 0.0–0.2)

## 2019-02-14 LAB — COMPREHENSIVE METABOLIC PANEL
ALT: 5 U/L (ref 0–44)
AST: 278 U/L — ABNORMAL HIGH (ref 15–41)
Albumin: 2.2 g/dL — ABNORMAL LOW (ref 3.5–5.0)
Alkaline Phosphatase: 70 U/L (ref 38–126)
Anion gap: 21 — ABNORMAL HIGH (ref 5–15)
BUN: 54 mg/dL — ABNORMAL HIGH (ref 8–23)
CO2: 15 mmol/L — ABNORMAL LOW (ref 22–32)
Calcium: 8.5 mg/dL — ABNORMAL LOW (ref 8.9–10.3)
Chloride: 97 mmol/L — ABNORMAL LOW (ref 98–111)
Creatinine, Ser: 4.56 mg/dL — ABNORMAL HIGH (ref 0.44–1.00)
GFR calc Af Amer: 11 mL/min — ABNORMAL LOW (ref >60)
GFR calc non Af Amer: 9 mL/min — ABNORMAL LOW (ref >60)
Glucose, Bld: 42 mg/dL — CL (ref 70–99)
Potassium: 5.1 mmol/L (ref 3.5–5.1)
Sodium: 133 mmol/L — ABNORMAL LOW (ref 135–145)
Total Bilirubin: 2.6 mg/dL — ABNORMAL HIGH (ref 0.3–1.2)
Total Protein: 9.9 g/dL — ABNORMAL HIGH (ref 6.5–8.1)

## 2019-02-14 LAB — HEMOGLOBIN AND HEMATOCRIT, BLOOD
HCT: 25.7 % — ABNORMAL LOW (ref 36.0–46.0)
HCT: 25.1 % — ABNORMAL LOW (ref 36.0–46.0)
Hemoglobin: 8.5 g/dL — ABNORMAL LOW (ref 12.0–15.0)
Hemoglobin: 8.3 g/dL — ABNORMAL LOW (ref 12.0–15.0)

## 2019-02-14 LAB — RENAL FUNCTION PANEL
Albumin: 2.1 g/dL — ABNORMAL LOW (ref 3.5–5.0)
Anion gap: 18 — ABNORMAL HIGH (ref 5–15)
BUN: 47 mg/dL — ABNORMAL HIGH (ref 8–23)
CO2: 19 mmol/L — ABNORMAL LOW (ref 22–32)
Calcium: 8.6 mg/dL — ABNORMAL LOW (ref 8.9–10.3)
Chloride: 96 mmol/L — ABNORMAL LOW (ref 98–111)
Creatinine, Ser: 3.9 mg/dL — ABNORMAL HIGH (ref 0.44–1.00)
GFR calc Af Amer: 13 mL/min — ABNORMAL LOW (ref >60)
GFR calc non Af Amer: 11 mL/min — ABNORMAL LOW (ref >60)
Glucose, Bld: 74 mg/dL (ref 70–99)
Phosphorus: 6.1 mg/dL — ABNORMAL HIGH (ref 2.5–4.6)
Potassium: 4.2 mmol/L (ref 3.5–5.1)
Sodium: 133 mmol/L — ABNORMAL LOW (ref 135–145)

## 2019-02-14 LAB — MAGNESIUM: Magnesium: 2.2 mg/dL (ref 1.7–2.4)

## 2019-02-14 LAB — PROTIME-INR
INR: 2.8 — ABNORMAL HIGH (ref 0.8–1.2)
INR: 3.6 — ABNORMAL HIGH (ref 0.8–1.2)
Prothrombin Time: 29.1 seconds — ABNORMAL HIGH (ref 11.4–15.2)
Prothrombin Time: 35 seconds — ABNORMAL HIGH (ref 11.4–15.2)

## 2019-02-14 LAB — GLUCOSE, CAPILLARY
Glucose-Capillary: 102 mg/dL — ABNORMAL HIGH (ref 70–99)
Glucose-Capillary: 34 mg/dL — CL (ref 70–99)

## 2019-02-14 MED ORDER — CHLORHEXIDINE GLUCONATE CLOTH 2 % EX PADS
6.0000 | MEDICATED_PAD | Freq: Every day | CUTANEOUS | Status: DC
  Administered 2019-02-15 – 2019-02-16 (×2): 6 via TOPICAL

## 2019-02-14 MED ORDER — DARBEPOETIN ALFA 60 MCG/0.3ML IJ SOSY
60.0000 ug | PREFILLED_SYRINGE | Freq: Once | INTRAMUSCULAR | Status: AC
  Administered 2019-02-15: 05:00:00 60 ug via INTRAVENOUS

## 2019-02-14 MED ORDER — DEXTROSE 50 % IV SOLN
INTRAVENOUS | Status: AC
  Administered 2019-02-14: 25 mL
  Filled 2019-02-14: qty 50

## 2019-02-14 MED ORDER — WARFARIN SODIUM 1 MG PO TABS
1.0000 mg | ORAL_TABLET | Freq: Once | ORAL | Status: AC
  Administered 2019-02-14: 1 mg via ORAL
  Filled 2019-02-14: qty 1

## 2019-02-14 NOTE — Progress Notes (Signed)
ANTICOAGULATION CONSULT NOTE  Pharmacy Consult for warfarin to heparin Indication: atrial fibrillation and mechanical MVR  Vital Signs: Temp: 97.5 F (36.4 C) (10/07 0531) Temp Source: Oral (10/07 0531) BP: 107/46 (10/07 0531) Pulse Rate: 81 (10/07 0531)  Labs: Recent Labs    02/12/19 0605  02/13/19 0334  02/13/19 1457 02/13/19 2123 02/14/19 0336  HGB 8.2*   < > 7.5*   < > 8.3* 8.3* 8.3*  HCT 24.4*   < > 23.0*   < > 25.6* 25.1* 25.6*  PLT 209  --  154  --   --   --  170  LABPROT 29.7*  --  29.6*  --   --   --  29.1*  INR 2.9*  --  2.9*  --   --   --  2.8*  CREATININE 4.26*  --  3.21*  --   --   --  3.90*   < > = values in this interval not displayed.    Estimated Creatinine Clearance: 13.1 mL/min (A) (by C-G formula based on SCr of 3.9 mg/dL (H)).  Assessment: 68 y.o. female admitted with CHF/SOB, h/o Afib and mechanical MVR, to continue warfarin. PTA regimen is 4 mg daily, noted prior goal 2-3 has been adjusted to 2.5-3.5 this admit by cardiology. INR has been elevated likely due to vitamin K deficiency. Vitamin K given 9/22 and again 9/29 and 9/30.  INR this morning remains therapeutic at 2.8 - warfarin resumed cautiously yesterday. No bleeding issues noted, CBC stable.  **PTA warfarin dose = 4mg  daily  Goal of Therapy:  INR 2.5-3.5 Monitor platelets by anticoagulation protocol: Yes    Plan:  -Warfarin 1mg  PO x1 again tonight -Daily INR, CBC -Watch S/Sx bleeding closely -Start IV heparin gtt if INR < 2.5   Arrie Senate, PharmD, BCPS Clinical Pharmacist [phone] Please check AMION for all [facility] numbers 02/14/2019

## 2019-02-14 NOTE — Telephone Encounter (Signed)
Patient's appointment from yesterday was cancelled. No one answers the telephone at her home number. Not sure if patient is still in the hospital.

## 2019-02-14 NOTE — Plan of Care (Signed)
  Problem: Clinical Measurements: Goal: Will remain free from infection Outcome: Progressing Goal: Diagnostic test results will improve Outcome: Progressing   Problem: Pain Managment: Goal: General experience of comfort will improve Outcome: Progressing   Problem: Nutrition: Goal: Adequate nutrition will be maintained Outcome: Not Progressing   Problem: Elimination: Goal: Will not experience complications related to urinary retention Outcome: Not Progressing

## 2019-02-14 NOTE — Progress Notes (Signed)
Physical Therapy Treatment Patient Details Name: Marie Malone MRN: [id number] DOB: 07-12-1950 Today's Date: 02/14/2019    History of Present Illness Marie Malone is a 68 y.o. 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   Work up includes acute respiratory failure with hypoxia, A/C CHF, and L LE pain due to cellulitis vs SIRS.    PT Comments    Continuing work on functional mobility and activity tolerance;  Still sleepy and eyes closed the majority of session; Small, but noteworthy improvements in sitting balance and tolerance; LLE still painful; Back to supine after EOB activity      02/14/19 0940 02/14/19 0945  Orthostatic Lying   BP- Lying 113/44  --   Orthostatic Sitting  BP- Sitting 120/85 100/61 (After upright activity, with bed in semi-chair position)     Follow Up Recommendations  SNF     Equipment Recommendations  (To be determined)    Recommendations for Other Services       Precautions / Restrictions Precautions Precautions: Fall Precaution Comments: lethargic    Mobility  Bed Mobility Overal bed mobility: Needs Assistance Bed Mobility: Sit to Supine;Supine to Sit Rolling: Max assist Sidelying to sit: Max assist   Sit to supine: Max assist   General bed mobility comments: Used bed pad to support knees coming into hooklying, and rolling R; Max assist to clear LEs from EOB and elevat trunk to sit  Transfers                 General transfer comment: unable to complete stand this date due to lethargy  Ambulation/Gait             General Gait Details: unable this date   Stairs             Wheelchair Mobility    Modified Rankin (Stroke Patients Only)       Balance     Sitting balance-Leahy Scale: Fair Sitting balance - Comments: Able to support self  sitting EOB initially with close guard for safety                                    Cognition Arousal/Alertness: Lethargic Behavior During Therapy: Flat affect Overall Cognitive Status: Impaired/Different from baseline                   Orientation Level: Disoriented to;Time;Situation     Following Commands: Follows one step commands with increased time;Follows one step commands inconsistently     Problem Solving: Slow processing;Decreased initiation;Difficulty sequencing;Requires verbal cues;Requires tactile cues General Comments: pt with minimal eye opening, difficulty falling asleep, stated she was in the hospital, very delayed processing, no initiation of task      Exercises      General Comments        Pertinent Vitals/Pain Pain Assessment: Faces Faces Pain Scale: Hurts a little bit Pain Location: LLE Pain Descriptors / Indicators: Grimacing Pain Intervention(s): Monitored during session;Repositioned    Home Living                      Prior Function            PT Goals (current goals can now be found in the care plan section) Acute Rehab PT Goals Patient  Stated Goal: Did not state a goal today PT Goal Formulation: Patient unable to participate in goal setting(Today) Time For Goal Achievement: 02/21/19 Potential to Achieve Goals: Fair Progress towards PT goals: Progressing toward goals(Very slowly)    Frequency    Min 3X/week      PT Plan Current plan remains appropriate    Co-evaluation              AM-PAC PT "6 Clicks" Mobility   Outcome Measure  Help needed turning from your back to your side while in a flat bed without using bedrails?: Total Help needed moving from lying on your back to sitting on the side of a flat bed without using bedrails?: Total Help needed moving to and from a bed to a chair (including a wheelchair)?: Total Help needed standing up from a chair using your arms (e.g., wheelchair or  bedside chair)?: Total Help needed to walk in hospital room?: Total Help needed climbing 3-5 steps with a railing? : Total 6 Click Score: 6    End of Session   Activity Tolerance: Patient limited by lethargy Patient left: in bed;with call bell/phone within reach Nurse Communication: Mobility status PT Visit Diagnosis: Other abnormalities of gait and mobility (R26.89);Pain Pain - Right/Left: Left Pain - part of body: Leg     Time: 7412-8786 PT Time Calculation (min) (ACUTE ONLY): 20 min  Charges:  $Therapeutic Activity: 8-22 mins                     Marie Malone, PT  Acute Rehabilitation Services Pager [phone] Office [place] 02/14/2019, 11:57 AM

## 2019-02-14 NOTE — Progress Notes (Addendum)
Lab called to advise glucose was 42, Checked CBG which was 34. Administered 25 mg dextrose. 15 minute recheck, CBG 102. Notified Schorr that put in order for CBG checks. Will continue to monitor.

## 2019-02-14 NOTE — Progress Notes (Signed)
PROGRESS NOTE    Marie Malone  MWN:[id number] DOB: 06-08-1950 DOA: 01/29/2019 PCP: Debbrah Alar, NP    Brief Narrative:  68 y.o. 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 Patient states over the last 2 weeks patient has been getting progressively short of breath on exertion. Denies any chest pain fever chills or productive cough. Patient's primary care physician advised her to increase her Lasix twice daily which she is supposed to start today. Because of worsening shortness of breath patient came to the ER. EMS was called and patient initially was placed on BiPAP. Patient also had complained of some left calf cramping.  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   S/P CABG x 1   Acute on chronic systolic CHF (congestive heart failure) (HCC)   Atrial fibrillation, chronic (HCC)   Cellulitis of left leg   Acute renal failure (HCC)   Primary biliary cirrhosis (HCC)   Chest pain at rest   H/O mitral valve replacement with mechanical valve   Atypical atrial flutter (HCC)   AKI (acute kidney injury) (HCC)   Hematochezia   Supratherapeutic INR   LLE cellulitis - Bld Cx, UCxnegative - afebrile, WBC normal, abx completed; leg is better - resolved  LLE Swelling - suspect this is due to above - cont to elevate LE above heart     - Cont with HD per below  Acute renal failure (baseline Cr 1.0)/ATN NGMA Hyponatremia Hyperkalemia - nephro onboard, appreciate assistance -Potassium normalized - Nephrology continues to follow believes significant  possibility of needing long term HD - HD planned for MWF  Acute respiratory failure with hypoxia - resolved, stable at this time  Chest pain acute started 9/21 Elevated Troponin -Patient borderline hypotensive with MAP 60-63 -EKG: atrial flutter, nonspecific ST-T wave changes no changes from previous EKG -Troponin high-sensitivity 791 -> 718 - Cardiology was consulted  Acute on chronic systolic CHF - follow I&O and daily wts  - EF 40-45% - cards consulted - metoprolol XL 12.5mg    Mechanical mitral valve -Continue warfarin, monitor INR - Therapeutic INR goal 2.5-3.5 -INR remains therapeutic - continued on coumadin  Atrial fibrillation chronic -Currently NSR -Anticoagulated for her mechanical mitral valve - Cardiology had considered DCCV, however on re-eval, pt uncertain to be a good candidate for TEE/DCCV given low likelihood of maintaining sinus rhythm with moderate to severe biatrial enlargement and mild cardiomyopathy     - coumadin resumed per pharm  Primary biliary cirrhosis(PBC) -Ursodial 300 mg BID.  - Continue with benadryl for itching  Diarrhea -9/22 patient reports diarrhea to cardiology - Fecal fat wnl - GI pathogen panel by PCR negative  - Lactoferrin negative - resolved  Anemia GIB -No endoscopy planned; GI has s/o'd -Stable at this time  DVT prophylaxis: Coumadin Code Status: Full Family Communication: Pt in room, family at bedside Disposition Plan: Uncertain at this time  Consultants:   Nephrology  Cardiology  Procedures:     Antimicrobials: Anti-infectives (From admission, onward)   Start     Dose/Rate Route Frequency Ordered Stop   02/05/19 1947  ceFAZolin (ANCEF) IVPB 2g/100 mL premix  Status:  Discontinued  2 g 200 mL/hr over 30 Minutes Intravenous Every 12 hours 02/05/19 1948 02/09/19 1404   02/04/19 1730   ceFAZolin (ANCEF) IVPB 2g/100 mL premix  Status:  Discontinued     2 g 200 mL/hr over 30 Minutes Intravenous Every 12 hours 02/04/19 0948 02/05/19 1948   02/01/19 2000  ceFAZolin (ANCEF) IVPB 2g/100 mL premix  Status:  Discontinued     2 g 200 mL/hr over 30 Minutes Intravenous Every 8 hours 02/01/19 1524 02/04/19 0948   02/01/19 1300  vancomycin (VANCOCIN) IVPB 750 mg/150 ml premix  Status:  Discontinued     750 mg 150 mL/hr over 60 Minutes Intravenous Every 24 hours 01/31/19 1152 02/01/19 1519   01/31/19 1300  vancomycin (VANCOCIN) 1,250 mg in sodium chloride 0.9 % 250 mL IVPB     1,250 mg 166.7 mL/hr over 90 Minutes Intravenous  Once 01/31/19 1152 01/31/19 1448   01/27/19 0930  cefTRIAXone (ROCEPHIN) 2 g in sodium chloride 0.9 % 100 mL IVPB  Status:  Discontinued     2 g 200 mL/hr over 30 Minutes Intravenous Every 24 hours 01/27/19 0824 02/01/19 1519       Subjective: Without complaints this AM  Objective: Vitals:   02/13/19 2035 02/13/19 2125 02/14/19 0531 02/14/19 0930  BP: (!) 107/57 (!) 110/53 (!) 107/46 (!) 113/44  Pulse: 83  81   Resp: 14 15 16 20   Temp: 97.6 F (36.4 C)  (!) 97.5 F (36.4 C)   TempSrc: Oral  Oral   SpO2: 99%  99%   Weight:      Height:        Intake/Output Summary (Last 24 hours) at 02/14/2019 1713 Last data filed at 02/14/2019 1500 Gross per 24 hour  Intake 170 ml  Output -  Net 170 ml   Filed Weights   02/12/19 0810 02/12/19 1149 02/13/19 0426  Weight: 76 kg 73 kg 73 kg    Examination:  General exam: Appears calm and comfortable  Respiratory system: Clear to auscultation. Respiratory effort normal. Cardiovascular system: S1 & S2 heard, RRR Gastrointestinal system: Abdomen is nondistended, soft and nontender. No organomegaly or masses felt. Normal bowel sounds heard. Central nervous system: Alert and oriented. No focal neurological deficits. Extremities: Symmetric 5 x 5 power. Skin: No rashes, lesions Psychiatry: Judgement and insight  appear normal. Mood & affect appropriate.   Data Reviewed: I have personally reviewed following labs and imaging studies  CBC: Recent Labs  Lab 02/10/19 0332  02/11/19 0556  02/12/19 0605  02/13/19 0334  02/13/19 1457 02/13/19 2123 02/14/19 0336 02/14/19 0920 02/14/19 1500  WBC 10.4  --  9.5  --  9.2  --  8.0  --   --   --  8.8  --   --   HGB 8.7*   < > 8.2*   < > 8.2*   < > 7.5*   < > 8.3* 8.3* 8.3* 8.5* 8.3*  HCT 25.2*   < > 24.9*   < > 24.4*   < > 23.0*   < > 25.6* 25.1* 25.6* 25.7* 25.1*  MCV 83.7  --  88.9  --  86.2  --  88.5  --   --   --  88.6  --   --   PLT 212  --  215  --  209  --  154  --   --   --  170  --   --    < > = values in this  interval not displayed.   Basic Metabolic Panel: Recent Labs  Lab 02/09/19 0403 02/10/19 0332 02/11/19 0556 02/12/19 0605 02/13/19 0334 02/14/19 0336  NA 131* 133* 135 135 134* 133*  K 4.2 3.5 4.0 4.4 3.3* 4.2  CL 97* 98 98 99 98 96*  CO2 19* 21* 21* 21* 23 19*  GLUCOSE 67* 72 78 81 108* 74  BUN 100* 66* 74* 76* 41* 47*  CREATININE 3.60* 2.87* 3.58* 4.26* 3.21* 3.90*  CALCIUM 8.6* 8.4* 8.5* 8.7* 8.1* 8.6*  MG 2.3 2.2 2.2 2.4 2.0 2.2  PHOS 5.0* 4.1 5.5* 6.4*  --  6.1*   GFR: Estimated Creatinine Clearance: 13.1 mL/min (A) (by C-G formula based on SCr of 3.9 mg/dL (H)). Liver Function Tests: Recent Labs  Lab 02/11/19 0556 02/12/19 0605 02/12/19 1330 02/13/19 0334 02/14/19 0336  AST  --   --  152* 153*  --   ALT  --   --  <5 5  --   ALKPHOS  --   --  67 61  --   BILITOT  --   --  2.2* 1.7*  --   PROT  --   --  9.1* 8.8*  --   ALBUMIN 2.2* 2.1* 2.2* 2.0* 2.1*   No results for input(s): LIPASE, AMYLASE in the last 168 hours. Recent Labs  Lab 02/10/19 1124  AMMONIA 34   Coagulation Profile: Recent Labs  Lab 02/10/19 1811 02/11/19 0645 02/12/19 0605 02/13/19 0334 02/14/19 0336  INR 2.9* 2.8* 2.9* 2.9* 2.8*   Cardiac Enzymes: No results for input(s): CKTOTAL, CKMB, CKMBINDEX, TROPONINI in the last 168  hours. BNP (last 3 results) No results for input(s): PROBNP in the last 8760 hours. HbA1C: No results for input(s): HGBA1C in the last 72 hours. CBG: No results for input(s): GLUCAP in the last 168 hours. Lipid Profile: No results for input(s): CHOL, HDL, LDLCALC, TRIG, CHOLHDL, LDLDIRECT in the last 72 hours. Thyroid Function Tests: No results for input(s): TSH, T4TOTAL, FREET4, T3FREE, THYROIDAB in the last 72 hours. Anemia Panel: Recent Labs    02/12/19 1330  FERRITIN 699*  TIBC 232*  IRON 96   Sepsis Labs: No results for input(s): PROCALCITON, LATICACIDVEN in the last 168 hours.  No results found for this or any previous visit (from the past 240 hour(s)).   Radiology Studies: No results found.  Scheduled Meds: . atorvastatin  40 mg Oral q1800  . Chlorhexidine Gluconate Cloth  6 each Topical Q0600  . [START ON 02/15/2019] Chlorhexidine Gluconate Cloth  6 each Topical Q0600  . darbepoetin (ARANESP) injection - DIALYSIS  60 mcg Intravenous Once  . feeding supplement (ENSURE ENLIVE)  237 mL Oral BID BM  . metoprolol succinate  12.5 mg Oral Daily  . pantoprazole  40 mg Oral Daily  . ursodiol  300 mg Oral BID  . warfarin  1 mg Oral ONCE-1800  . Warfarin - Pharmacist Dosing Inpatient   Does not apply q1800   Continuous Infusions:   LOS: 21 days   Marylu Lund, MD [facility] Pager On Amion  If 7PM-7AM, please contact night-coverage 02/14/2019, 5:13 PM

## 2019-02-14 NOTE — Progress Notes (Signed)
Patient ID: Marie Malone, female   DOB: January 02, 1951, 68 y.o.   MRN: [id number] [facility] Progress Note   Assessment/ Plan:   1.  Acute kidney injury:  Some urine output overnight-remains oliguric.  Differential diagnosis include ATN and acute interstitial nephritis (AIN) with available labs favoring hemodynamically mediated AKI/ATN.  Renal ultrasound negative for any obstruction and abdominal/pelvic imaging showing ascites/abdominal wall edema.  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   - HD today and per MWF schedule for now as needed.  Monitor for renal recovery  - baseline Cr 1 - Continue foley catheter for now - Would avoid hypotension; noted metoprolol is for atypical a flutter    2.  Hypoxic respiratory failure: Secondary to acute exacerbation of diastolic heart failure in this patient with pulmonary hypertension.  Optimize volume with HD as tolerated    3.  Left leg cellulitis:  Status post completion of antibiotic course    4.  Anemia: secondary to chronic disease and recent acute loss.  iron is replete.  aranesp ordered 10/5 but patient did not receive.  Ordered for 10/7   5.  History of mechanical mitral valve: On anticoagulation per primary team. Hx supratherapeutic INR  6.  History of primary biliary cirrhosis   7. Atypical flutter  - on metoprolol per cardiology   8. Acute on chronic combined CHF  - optimize volume status with HD as able   Subjective:   No urine output is charted for 10/5.  Had 2 kg UF with HD on 10/5.  She is more somber today.  Hasn't had much UOP though had bladder scan 280 yesterday.   Review of systems:  Some shortness of breath; no CP Some nausea, no vomiting    Objective:   BP (!) 113/44   Pulse 81   Temp (!) 97.5 F (36.4 C) (Oral)   Resp 20   Ht 5\' 2"  (1.575 m)   Wt 73 kg   SpO2  99%   BMI 29.44 kg/m   Intake/Output Summary (Last 24 hours) at 02/14/2019 1315 Last data filed at 02/13/2019 2137 Gross per 24 hour  Intake 120 ml  Output -  Net 120 ml   Weight change:   Physical Exam:  Gen: adult female in bed in NAD CVS: Pulse regular rhythm, normal rate, normal S1 and S2 Resp: clear anteriorly and unlabored  Abd: Soft, slightly distended, bowel sounds normal Ext: 1+ edema lower extremities  Access: RIJ nontunneled catheter   Imaging: No results found.  Labs: BMET Recent Labs  Lab 02/08/19 0312 02/09/19 0403 02/10/19 0332 02/11/19 0556 02/12/19 0605 02/13/19 0334 02/14/19 0336  NA 128* 131* 133* 135 135 134* 133*  K 6.2* 4.2 3.5 4.0 4.4 3.3* 4.2  CL 97* 97* 98 98 99 98 96*  CO2 19* 19* 21* 21* 21* 23 19*  GLUCOSE 73 67* 72 78 81 108* 74  BUN 98* 100* 66* 74* 76* 41* 47*  CREATININE 3.34* 3.60* 2.87* 3.58* 4.26* 3.21* 3.90*  CALCIUM 8.5* 8.6* 8.4* 8.5* 8.7* 8.1* 8.6*  PHOS 4.6 5.0* 4.1 5.5* 6.4*  --  6.1*   CBC Recent Labs  Lab 02/11/19 0556  02/12/19 0605  02/13/19 0334  02/13/19 1457 02/13/19 2123 02/14/19 0336 02/14/19 0920  WBC 9.5  --  9.2  --  8.0  --   --   --  8.8  --  HGB 8.2*   < > 8.2*   < > 7.5*   < > 8.3* 8.3* 8.3* 8.5*  HCT 24.9*   < > 24.4*   < > 23.0*   < > 25.6* 25.1* 25.6* 25.7*  MCV 88.9  --  86.2  --  88.5  --   --   --  88.6  --   PLT 215  --  209  --  154  --   --   --  170  --    < > = values in this interval not displayed.    Medications:    . atorvastatin  40 mg Oral q1800  . Chlorhexidine Gluconate Cloth  6 each Topical Q0600  . darbepoetin (ARANESP) injection - DIALYSIS  60 mcg Intravenous Q Wed-HD  . feeding supplement (ENSURE ENLIVE)  237 mL Oral BID BM  . metoprolol succinate  12.5 mg Oral Daily  . pantoprazole  40 mg Oral Daily  . ursodiol  300 mg Oral BID  . warfarin  1 mg Oral ONCE-1800  . Warfarin - Pharmacist Dosing Inpatient   Does not apply q1800   Claudia Desanctis  02/14/2019, 1:15 PM

## 2019-02-14 NOTE — Progress Notes (Signed)
Progress Note  Patient Name: Marie Malone Date of Encounter: 02/14/2019  Primary Cardiologist: [name], MD   Subjective   Physical therapy is working with the patient at this time.  The patient is not very communicative.  She does moan when rolled onto her side and points to her left leg.  Patient does not answer questions about her cardiac symptoms.  She appears to be breathing without any difficulty  Inpatient Medications    Scheduled Meds: . atorvastatin  40 mg Oral q1800  . Chlorhexidine Gluconate Cloth  6 each Topical Q0600  . darbepoetin (ARANESP) injection - DIALYSIS  60 mcg Intravenous Q Wed-HD  . feeding supplement (ENSURE ENLIVE)  237 mL Oral BID BM  . metoprolol succinate  12.5 mg Oral Daily  . pantoprazole  40 mg Oral Daily  . ursodiol  300 mg Oral BID  . warfarin  1 mg Oral ONCE-1800  . Warfarin - Pharmacist Dosing Inpatient   Does not apply q1800   Continuous Infusions:  PRN Meds: acetaminophen, diphenhydrAMINE, docusate sodium, lidocaine (PF), ondansetron **OR** ondansetron (ZOFRAN) IV, oxyCODONE **OR** oxyCODONE   Vital Signs    Vitals:   02/13/19 1800 02/13/19 2035 02/13/19 2125 02/14/19 0531  BP:  (!) 107/57 (!) 110/53 (!) 107/46  Pulse:  83  81  Resp: 12 14 15 16   Temp:  97.6 F (36.4 C)  (!) 97.5 F (36.4 C)  TempSrc:  Oral  Oral  SpO2:  99%  99%  Weight:      Height:        Intake/Output Summary (Last 24 hours) at 02/14/2019 0914 Last data filed at 02/13/2019 2137 Gross per 24 hour  Intake 220 ml  Output -  Net 220 ml   Last 3 Weights 02/13/2019 02/12/2019 02/12/2019  Weight (lbs) 160 lb 15 oz 160 lb 15 oz 167 lb 8.8 oz  Weight (kg) 73 kg 73 kg 76 kg      Telemetry    Atrial flutter in the 80s- Personally Reviewed  ECG    No new tracings for review  Physical Exam   GEN: No acute distress.   Neck: + JVD Cardiac: RRR, no murmurs, rubs, or gallops.  Respiratory: Clear to auscultation bilaterally. GI: Soft, nontender,  non-distended  MS: 1+ bilateral lower leg edema;  Neuro:   Patient alert but noncommunicative Psych: Flat affect  Labs    High Sensitivity Troponin:   Recent Labs  Lab 01/24/19 0259 01/24/19 0738 01/24/19 0807 01/29/19 1635 01/29/19 1835  TROPONINIHS 132* 111* 113* 791* 718*      Chemistry Recent Labs  Lab 02/12/19 0605 02/12/19 1330 02/13/19 0334 02/14/19 0336  NA 135  --  134* 133*  K 4.4  --  3.3* 4.2  CL 99  --  98 96*  CO2 21*  --  23 19*  GLUCOSE 81  --  108* 74  BUN 76*  --  41* 47*  CREATININE 4.26*  --  3.21* 3.90*  CALCIUM 8.7*  --  8.1* 8.6*  PROT  --  9.1* 8.8*  --   ALBUMIN 2.1* 2.2* 2.0* 2.1*  AST  --  152* 153*  --   ALT  --  <5 5  --   ALKPHOS  --  67 61  --   BILITOT  --  2.2* 1.7*  --   GFRNONAA 10*  --  14* 11*  GFRAA 12*  --  16* 13*  ANIONGAP 15  --  13  18*     Hematology Recent Labs  Lab 02/12/19 0605  02/13/19 0334  02/13/19 1457 02/13/19 2123 02/14/19 0336  WBC 9.2  --  8.0  --   --   --  8.8  RBC 2.83*  --  2.60*  --   --   --  2.89*  HGB 8.2*   < > 7.5*   < > 8.3* 8.3* 8.3*  HCT 24.4*   < > 23.0*   < > 25.6* 25.1* 25.6*  MCV 86.2  --  88.5  --   --   --  88.6  MCH 29.0  --  28.8  --   --   --  28.7  MCHC 33.6  --  32.6  --   --   --  32.4  RDW 20.3*  --  21.0*  --   --   --  21.6*  PLT 209  --  154  --   --   --  170   < > = values in this interval not displayed.    BNPNo results for input(s): BNP, PROBNP in the last 168 hours.   DDimer No results for input(s): DDIMER in the last 168 hours.   Radiology    No results found.  Cardiac Studies   Echocardiogram 01/24/2019: 1. Left ventricular ejection fraction, by visual estimation, is 45 to 50%. The left ventricle has mildly decreased function. Normal left ventricular size. Left ventricular septal wall thickness was normal. Normal left ventricular posterior wall thickness. There is no left ventricular hypertrophy. 2. Indeterminate diastolic function due to mechanical  mitral valve pattern of LV diastolic filling. 3. Right ventricular pressure overload and right ventricular volume overload. 4. Global right ventricle has moderately reduced systolic function.The right ventricular size is moderately enlarged. No increase in right ventricular wall thickness. 5. Left atrial size was moderately dilated. 6. Right atrial size was severely dilated. 7. The mitral valve has been repaired/replaced. No evidence of mitral valve regurgitation. No evidence of mitral stenosis. 8. The tricuspid valve is normal in structure. Tricuspid valve regurgitation mild-moderate. 9. The aortic valve The aortic valve is normal in structure. Aortic valve regurgitation was not visualized by color flow Doppler. Structurally normal aortic valve, with no evidence of sclerosis or stenosis. 10. The pulmonic valve was normal in structure. Pulmonic valve regurgitation is mild by color flow Doppler. 11. Moderately elevated pulmonary artery systolic pressure. 12. The inferior vena cava is dilated in size with <50% respiratory variability, suggesting right atrial pressure of 15 mmHg.   Patient Profile     68 y.o. female with a history of rheumatic fever/mitral valve stenosis s/p valvuloplasty at [facility] in 2006, CAD s/p CABG, mechanical MVR, paroxysmal afib with MAZE procedure in 2018 on coumadin, PAD s/p endarterectomy in 03/2016 who was admitted on 9/16 for acute heart failurein the setting of left leg cellulitis,complicated by aflutter, now found to have worsening AKI.  Assessment & Plan    1.  Atypical atrial flutter with variable conduction, also history of atrial fibrillation s/p maze procedure in 2018 - CHA2DS2-VASc = 6 (age x 1, female, CVA x 2, HTN, D-CHF) - continue Toprol XL 12.5 mg qd - INR currently therapeutic despite no coumadin in 10 days, per pharmacy,  - INR 2.2 on admit, but went to 7.7 on 09/22.  Has been therapeutic for several days, 2.8 today. -Rate control for now,  patient felt not to be a good electrical cardioversion candidate due to moderately enlarged left atria,  severely enlarged right atria and mild cardiomyopathy. -Rates are currently well controlled in the 80s on low-dose beta-blocker.  2.  Acute on chronic combined systolic and diastolic heart failure with LVEF 45-50% -Volume stable, managed by hemodialysis. -BP too low for other heart failure medications. -Patient also has malnutrition with albumin of 2.  3.  Acute kidney injury/ESRD -Currently oliguric with no urine output documented.  -On HD MWF as needed.  Patient had 2 kg ultrafiltrate with HD on 10/5. -Being followed by nephrology, differential diagnoses include ATN and acute interstitial nephritis with available labs favoring hemodynamically mediated AKI/ATN.  Renal ultrasound negative for obstruction  4.  Acute on chronic anemia with intermittent hematochezia  -INR was greater than 5 on admission -GI plans no evaluation -Internal medicine following.  Hemoglobin has been less than 9 since 10/01, 8.3 today, stable.   5.  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  Currently therapeutic, 2.8 today.  Warfarin dosing per pharmacy.      For questions or updates, please contact [place] Please consult [email or web address] for contact info under        Signed, Daune Perch, NP  02/14/2019, 9:14 AM

## 2019-02-14 NOTE — Progress Notes (Signed)
Hypoglycemic Event  CBG: 42  Treatment: 23ml Dextrose injection  Symptoms: drowsy Follow-up CBG: Time:2333 CBG Result: 102  Possible Reasons for Event: no po intake  Comments/MD notified:    [employer]

## 2019-02-15 DIAGNOSIS — L8990 Pressure ulcer of unspecified site, unspecified stage: Secondary | ICD-10-CM | POA: Insufficient documentation

## 2019-02-15 LAB — BASIC METABOLIC PANEL
Anion gap: 16 — ABNORMAL HIGH (ref 5–15)
BUN: 28 mg/dL — ABNORMAL HIGH (ref 8–23)
CO2: 22 mmol/L (ref 22–32)
Calcium: 8.4 mg/dL — ABNORMAL LOW (ref 8.9–10.3)
Chloride: 96 mmol/L — ABNORMAL LOW (ref 98–111)
Creatinine, Ser: 2.99 mg/dL — ABNORMAL HIGH (ref 0.44–1.00)
GFR calc Af Amer: 18 mL/min — ABNORMAL LOW (ref >60)
GFR calc non Af Amer: 15 mL/min — ABNORMAL LOW (ref >60)
Glucose, Bld: 85 mg/dL (ref 70–99)
Potassium: 3.7 mmol/L (ref 3.5–5.1)
Sodium: 134 mmol/L — ABNORMAL LOW (ref 135–145)

## 2019-02-15 LAB — GLUCOSE, CAPILLARY
Glucose-Capillary: 46 mg/dL — ABNORMAL LOW (ref 70–99)
Glucose-Capillary: 60 mg/dL — ABNORMAL LOW (ref 70–99)
Glucose-Capillary: 71 mg/dL (ref 70–99)
Glucose-Capillary: 76 mg/dL (ref 70–99)
Glucose-Capillary: 84 mg/dL (ref 70–99)
Glucose-Capillary: 90 mg/dL (ref 70–99)
Glucose-Capillary: 91 mg/dL (ref 70–99)
Glucose-Capillary: 96 mg/dL (ref 70–99)
Glucose-Capillary: 98 mg/dL (ref 70–99)

## 2019-02-15 LAB — HEMOGLOBIN AND HEMATOCRIT, BLOOD
HCT: 24 % — ABNORMAL LOW (ref 36.0–46.0)
HCT: 24.3 % — ABNORMAL LOW (ref 36.0–46.0)
Hemoglobin: 8 g/dL — ABNORMAL LOW (ref 12.0–15.0)
Hemoglobin: 8 g/dL — ABNORMAL LOW (ref 12.0–15.0)

## 2019-02-15 LAB — PROTIME-INR
INR: 3.9 — ABNORMAL HIGH (ref 0.8–1.2)
Prothrombin Time: 37.3 seconds — ABNORMAL HIGH (ref 11.4–15.2)

## 2019-02-15 MED ORDER — DARBEPOETIN ALFA 60 MCG/0.3ML IJ SOSY
PREFILLED_SYRINGE | INTRAMUSCULAR | Status: AC
  Administered 2019-02-15: 60 ug via INTRAVENOUS
  Filled 2019-02-15: qty 0.3

## 2019-02-15 MED ORDER — DEXTROSE 50 % IV SOLN
INTRAVENOUS | Status: AC
  Administered 2019-02-15: 25 mL
  Filled 2019-02-15: qty 50

## 2019-02-15 MED ORDER — DEXTROSE-NACL 5-0.9 % IV SOLN
INTRAVENOUS | Status: DC
  Administered 2019-02-15: 18:00:00 via INTRAVENOUS

## 2019-02-15 MED ORDER — DEXTROSE 50 % IV SOLN
INTRAVENOUS | Status: AC
  Administered 2019-02-15 (×2): 25 mL
  Filled 2019-02-15: qty 50

## 2019-02-15 MED ORDER — HEPARIN SODIUM (PORCINE) 1000 UNIT/ML IJ SOLN
1000.0000 [IU] | INTRAMUSCULAR | Status: DC | PRN
  Administered 2019-02-15 – 2019-02-17 (×2): 2600 [IU] via INTRAVENOUS
  Filled 2019-02-15 (×3): qty 1

## 2019-02-15 MED ORDER — BOOST / RESOURCE BREEZE PO LIQD CUSTOM: 1.0000 | Freq: Three times a day (TID) | ORAL | Status: DC

## 2019-02-15 MED ORDER — HEPARIN SODIUM (PORCINE) 1000 UNIT/ML IJ SOLN
INTRAMUSCULAR | Status: AC
  Administered 2019-02-15: 2600 [IU] via INTRAVENOUS
  Filled 2019-02-15: qty 3

## 2019-02-15 MED ORDER — DARBEPOETIN ALFA 60 MCG/0.3ML IJ SOSY: 60.0000 ug | PREFILLED_SYRINGE | INTRAMUSCULAR | Status: DC

## 2019-02-15 NOTE — Plan of Care (Signed)
?  Problem: Clinical Measurements: ?Goal: Will remain free from infection ?Outcome: Progressing ?Goal: Diagnostic test results will improve ?Outcome: Progressing ?Goal: Respiratory complications will improve ?Outcome: Progressing ?  ?

## 2019-02-15 NOTE — Progress Notes (Signed)
ANTICOAGULATION CONSULT NOTE  Pharmacy Consult for warfarin to heparin Indication: atrial fibrillation and mechanical MVR  Vital Signs: Temp: 97.6 F (36.4 C) (10/08 0741) Temp Source: Axillary (10/08 0741) BP: 117/65 (10/08 0929) Pulse Rate: 87 (10/08 0929)  Labs: Recent Labs    02/13/19 0334  02/14/19 0336  02/14/19 1500 02/14/19 2126 02/15/19 0749 02/15/19 0919  HGB 7.5*   < > 8.3*   < > 8.3* 8.3*  --  8.0*  HCT 23.0*   < > 25.6*   < > 25.1* 25.6*  --  24.0*  PLT 154  --  170  --   --  174  --   --   LABPROT 29.6*  --  29.1*  --   --  35.0* 37.3*  --   INR 2.9*  --  2.8*  --   --  3.6* 3.9*  --   CREATININE 3.21*  --  3.90*  --   --  4.56*  --   --    < > = values in this interval not displayed.    Estimated Creatinine Clearance: 11.1 mL/min (A) (by C-G formula based on SCr of 4.56 mg/dL (H)).  Assessment: 68 y.o. female admitted with CHF/SOB, h/o Afib and mechanical MVR, to continue warfarin. PTA regimen is 4 mg daily, noted prior goal 2-3 has been adjusted to 2.5-3.5 this admit by cardiology. INR has been elevated likely due to vitamin K deficiency. Vitamin K given 9/22 and again 9/29 and 9/30.  INR this morning now above goal at 3.9. CBC stable.  **PTA warfarin dose = 4mg  daily  Goal of Therapy:  INR 2.5-3.5 Monitor platelets by anticoagulation protocol: Yes    Plan:  -Hold warfarin tonight - pt may require MWF dosing of warfarin if it remains labile -Daily INR, CBC -Watch S/Sx bleeding closely -Start IV heparin gtt if INR < 2.5   Arrie Senate, PharmD, BCPS Clinical Pharmacist [phone] Please check AMION for all [place] numbers 02/15/2019

## 2019-02-15 NOTE — Progress Notes (Signed)
PROGRESS NOTE    Marie Malone  ZDG:[id number] DOB: February 15, 1951 DOA: 01/11/2019 PCP: Debbrah Alar, NP    Brief Narrative:  68 y.o. 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 Patient states over the last 2 weeks patient has been getting progressively short of breath on exertion. Denies any chest pain fever chills or productive cough. Patient's primary care physician advised her to increase her Lasix twice daily which she is supposed to start today. Because of worsening shortness of breath patient came to the ER. EMS was called and patient initially was placed on BiPAP. Patient also had complained of some left calf cramping.  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   S/P CABG x 1   Acute on chronic systolic CHF (congestive heart failure) (HCC)   Atrial fibrillation, chronic (HCC)   Cellulitis of left leg   Acute renal failure (HCC)   Primary biliary cirrhosis (HCC)   Chest pain at rest   H/O mitral valve replacement with mechanical valve   Atypical atrial flutter (HCC)   AKI (acute kidney injury) (HCC)   Hematochezia   Supratherapeutic INR   LLE cellulitis - Bld Cx, UCxnegative - afebrile, WBC normal, abx completed; leg is better - noted to have been resolved  LLE Swelling - suspect this is due to above - cont to elevate LE above heart     - Would continue with HD per below  Acute renal failure (baseline Cr 1.0)/ATN NGMA Hyponatremia Hyperkalemia - Nephrology following, appreciate assistance -Potassium improved - Nephrology has been  following for consideration for long-term HD - HD currently planned for MWF -there are concerns of lethargy and decreased alertness, which makes long-term HD suboptimal, see below -Cont current regimen per Nephrology. Avoid sedating meds if possible  Acute respiratory failure with hypoxia - resolved, remains stable at this time  Chest pain acute started 9/21 Elevated Troponin -Patient borderline hypotensive with MAP 60-63 -EKG: atrial flutter, nonspecific ST-T wave changes no changes from previous EKG -Troponin high-sensitivity 791 -> 718 - Pt was seen by Cardiology  Acute on chronic systolic CHF - follow I&O and daily wts  - EF 40-45% - cards consulted - Pt is continued on metoprolol XL 12.5mg    Mechanical mitral valve -Continue warfarin, monitor INR - Therapeutic INR goal 2.5-3.5 -INR remains up to 3.9 today - continue coumadin dosing per Pharmacy  Atrial fibrillation chronic -Currently NSR -Anticoagulated for her mechanical mitral valve - Cardiology had considered DCCV, however on re-eval, pt uncertain to be a good candidate for TEE/DCCV given low likelihood of maintaining sinus rhythm with moderate to severe biatrial enlargement and mild cardiomyopathy     - coumadin is continued with dosing per Pharmacy  Primary biliary cirrhosis(PBC) -Continued with Ursodial 300 mg BID.  - Continue with benadryl for itching  Diarrhea -9/22 patient reports diarrhea to cardiology - Fecal fat wnl - GI pathogen panel by PCR negative  - Lactoferrin negative - resolved  Acute blood loss Anemia GIB -No endoscopy planned; GI has s/o'd -Hgb remains stable at this time  Lethargy -Unclear baseline mentation. Seems more tired appearing and less interactive this AM -Pt hypoglycemic overnight.  Will give trial of D5 fluids   Hypoglycemia -Pt with recurrent bouts of  hypoglycemia noted over past several days. Reportedly not tolerating PO very well -Discussed with Dr. Royce Macadamia. Will give trial of D5 fluids at [facility] rate only overnight -Cont to follow glycemic trends  DVT prophylaxis: Coumadin Code Status: Full Family Communication: Pt in room, family at bedside Disposition Plan: Uncertain at this time  Consultants:   Nephrology  Cardiology  Procedures:     Antimicrobials: Anti-infectives (From admission, onward)   Start     Dose/Rate Route Frequency Ordered Stop   02/05/19 1947  ceFAZolin (ANCEF) IVPB 2g/100 mL premix  Status:  Discontinued     2 g 200 mL/hr over 30 Minutes Intravenous Every 12 hours 02/05/19 1948 02/09/19 1404   02/04/19 1730  ceFAZolin (ANCEF) IVPB 2g/100 mL premix  Status:  Discontinued     2 g 200 mL/hr over 30 Minutes Intravenous Every 12 hours 02/04/19 0948 02/05/19 1948   02/01/19 2000  ceFAZolin (ANCEF) IVPB 2g/100 mL premix  Status:  Discontinued     2 g 200 mL/hr over 30 Minutes Intravenous Every 8 hours 02/01/19 1524 02/04/19 0948   02/01/19 1300  vancomycin (VANCOCIN) IVPB 750 mg/150 ml premix  Status:  Discontinued     750 mg 150 mL/hr over 60 Minutes Intravenous Every 24 hours 01/31/19 1152 02/01/19 1519   01/31/19 1300  vancomycin (VANCOCIN) 1,250 mg in sodium chloride 0.9 % 250 mL IVPB     1,250 mg 166.7 mL/hr over 90 Minutes Intravenous  Once 01/31/19 1152 01/31/19 1448   01/27/19 0930  cefTRIAXone (ROCEPHIN) 2 g in sodium chloride 0.9 % 100 mL IVPB  Status:  Discontinued     2 g 200 mL/hr over 30 Minutes Intravenous Every 24 hours 01/27/19 0824 02/01/19 1519      Subjective: Seems more tired this AM. Hypoglycemic overnight  Objective: Vitals:   02/15/19 1138 02/15/19 1203 02/15/19 1419 02/15/19 1613  BP: (!) 103/47 (!) 95/50 (!) 102/45 (!) 105/49  Pulse: 86  87 87  Resp: 12 11 12    Temp:  97.9 F (36.6 C) 97.6 F (36.4 C) 98.4 F (36.9 C)  TempSrc:  Oral Axillary Oral  SpO2: 100% 98% 99% 95%   Weight:      Height:        Intake/Output Summary (Last 24 hours) at 02/15/2019 1646 Last data filed at 02/15/2019 0630 Gross per 24 hour  Intake 50 ml  Output 2016 ml  Net -1966 ml   Filed Weights   02/13/19 0426 02/15/19 0215 02/15/19 0550  Weight: 73 kg 75 kg 72 kg    Examination: General exam: Asleep, arousable, laying in bed, in nad Respiratory system: Normal respiratory effort, no wheezing Cardiovascular system: regular rate, s1, s2 Gastrointestinal system: Soft, nondistended, positive BS Central nervous system: CN2-12 grossly intact, strength intact Extremities: Perfused, no clubbing Skin: Normal skin turgor, no notable skin lesions seen Psychiatry: Difficult to assess given current level of mentation  Data Reviewed: I have personally reviewed following labs and imaging studies  CBC: Recent Labs  Lab 02/11/19 0556  02/12/19 0605  02/13/19 0334  02/14/19 0336 02/14/19 0920 02/14/19 1500 02/14/19 2126 02/15/19 0919 02/15/19 1600  WBC 9.5  --  9.2  --  8.0  --  8.8  --   --  9.7  --   --   HGB 8.2*   < > 8.2*   < > 7.5*   < > 8.3* 8.5* 8.3*  8.3* 8.0* 8.0*  HCT 24.9*   < > 24.4*   < > 23.0*   < > 25.6* 25.7* 25.1* 25.6* 24.0* 24.3*  MCV 88.9  --  86.2  --  88.5  --  88.6  --   --  89.5  --   --   PLT 215  --  209  --  154  --  170  --   --  174  --   --    < > = values in this interval not displayed.   Basic Metabolic Panel: Recent Labs  Lab 02/09/19 0403 02/10/19 0332 02/11/19 0556 02/12/19 0605 02/13/19 0334 02/14/19 0336 02/14/19 2126 02/15/19 0749  NA 131* 133* 135 135 134* 133* 133* 134*  K 4.2 3.5 4.0 4.4 3.3* 4.2 5.1 3.7  CL 97* 98 98 99 98 96* 97* 96*  CO2 19* 21* 21* 21* 23 19* 15* 22  GLUCOSE 67* 72 78 81 108* 74 42* 85  BUN 100* 66* 74* 76* 41* 47* 54* 28*  CREATININE 3.60* 2.87* 3.58* 4.26* 3.21* 3.90* 4.56* 2.99*  CALCIUM 8.6* 8.4* 8.5* 8.7* 8.1* 8.6* 8.5* 8.4*  MG 2.3 2.2 2.2 2.4 2.0 2.2  --   --   PHOS 5.0* 4.1 5.5* 6.4*  --  6.1*   --   --    GFR: Estimated Creatinine Clearance: 17 mL/min (A) (by C-G formula based on SCr of 2.99 mg/dL (H)). Liver Function Tests: Recent Labs  Lab 02/12/19 0605 02/12/19 1330 02/13/19 0334 02/14/19 0336 02/14/19 2126  AST  --  152* 153*  --  278*  ALT  --  <5 5  --  5  ALKPHOS  --  67 61  --  70  BILITOT  --  2.2* 1.7*  --  2.6*  PROT  --  9.1* 8.8*  --  9.9*  ALBUMIN 2.1* 2.2* 2.0* 2.1* 2.2*   No results for input(s): LIPASE, AMYLASE in the last 168 hours. Recent Labs  Lab 02/10/19 1124  AMMONIA 34   Coagulation Profile: Recent Labs  Lab 02/12/19 0605 02/13/19 0334 02/14/19 0336 02/14/19 2126 02/15/19 0749  INR 2.9* 2.9* 2.8* 3.6* 3.9*   Cardiac Enzymes: No results for input(s): CKTOTAL, CKMB, CKMBINDEX, TROPONINI in the last 168 hours. BNP (last 3 results) No results for input(s): PROBNP in the last 8760 hours. HbA1C: No results for input(s): HGBA1C in the last 72 hours. CBG: Recent Labs  Lab 02/15/19 0114 02/15/19 0400 02/15/19 0726 02/15/19 1132 02/15/19 1616  GLUCAP 98 91 76 90 84   Lipid Profile: No results for input(s): CHOL, HDL, LDLCALC, TRIG, CHOLHDL, LDLDIRECT in the last 72 hours. Thyroid Function Tests: No results for input(s): TSH, T4TOTAL, FREET4, T3FREE, THYROIDAB in the last 72 hours. Anemia Panel: No results for input(s): VITAMINB12, FOLATE, FERRITIN, TIBC, IRON, RETICCTPCT in the last 72 hours. Sepsis Labs: No results for input(s): PROCALCITON, LATICACIDVEN in the last 168 hours.  No results found for this or any previous visit (from the past 240 hour(s)).   Radiology Studies: No results found.  Scheduled Meds: . atorvastatin  40 mg Oral q1800  . Chlorhexidine Gluconate Cloth  6 each Topical Q0600  . Chlorhexidine Gluconate Cloth  6 each Topical Q0600  . [START ON 02/22/2019] darbepoetin (ARANESP) injection - DIALYSIS  60 mcg Subcutaneous Q7 days  . feeding supplement  1 Container Oral TID WC  . feeding supplement (ENSURE  ENLIVE)  237 mL Oral BID BM  . metoprolol succinate  12.5 mg Oral Daily  . pantoprazole  40 mg Oral Daily  . ursodiol  300 mg Oral BID  . Warfarin - Pharmacist Dosing Inpatient   Does not apply q1800   Continuous Infusions:   LOS: 22 days   Marylu Lund, MD [facility] Pager On Amion  If 7PM-7AM, please contact night-coverage 02/15/2019, 4:46 PM

## 2019-02-15 NOTE — Progress Notes (Signed)
[name] CBG is 60. Administered 25 ml dextrose. 15 minute recheck CBG 98. Will continue to monitor.

## 2019-02-15 NOTE — Progress Notes (Signed)
Patient ID: Marie Malone, female   DOB: Jul 31, 1950, 68 y.o.   MRN: [id number] [facility] Progress Note   Assessment/ Plan:   1.  Acute kidney injury:  Some urine output overnight-remains oliguric.  Differential diagnosis include ATN and acute interstitial nephritis (AIN) with available labs favoring hemodynamically mediated AKI/ATN.  Renal ultrasound negative for any obstruction and abdominal/pelvic imaging showing ascites/abdominal wall edema.  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  Started on dialysis on 10/2 via nontunneled catheter  - Patient had HD early today - Thurs AM (10/8) due to inpatient volumes  - baseline Cr 1 - Next HD on Saturday 10/10  - Can discontinue foley catheter  - Would avoid hypotension; noted metoprolol is for atypical a flutter  - reassess tunneled catheter placement after team adjusts medications; they are also managing hypoglycemia.  Less communicative today or yesterday. Spoke with daughter Marie Malone at [phone] to update   2.  Hypoxic respiratory failure: Secondary to acute exacerbation of diastolic heart failure in this patient with pulmonary hypertension.  Optimize volume with HD as tolerated    3.  Left leg cellulitis:  Status post completion of antibiotic course    4.  Anemia: secondary to chronic disease and recent acute loss.  iron is replete.  aranesp given 10/8.   5.  History of mechanical mitral valve: On anticoagulation per primary team. Hx supratherapeutic INR   6.  History of primary biliary cirrhosis   7. Atypical flutter   - on metoprolol per cardiology   8. Acute on chronic combined CHF  - optimize volume status with HD as able   Subjective:   Had 2 kg UF with HD on 10/8 early AM.  Has been anuric with a foley.  Hypoglycemia overnight with 90 on most recent check.  Team is  following sugars closely and they have asked for reduced use of sedating medications.  Not very communicative this morning again.   Review of systems:   Unable to obtain secondary to patient factors. Answers limited questions.  No trouble breathing   Objective:   BP (!) 95/50 (BP Location: Left Arm)   Pulse 86   Temp 97.9 F (36.6 C) (Oral)   Resp 11   Ht 5\' 2"  (1.575 m)   Wt 72 kg   SpO2 98%   BMI 29.03 kg/m   Intake/Output Summary (Last 24 hours) at 02/15/2019 1315 Last data filed at 02/15/2019 9485 Gross per 24 hour  Intake 100 ml  Output 2016 ml  Net -1916 ml   Weight change:   Physical Exam:  Gen: adult female in bed not very communicative -does not answer questions CVS: Pulse regular rhythm, normal rate, normal S1 and S2 Resp: clear anteriorly and unlabored  Abd: Soft, slightly distended, bowel sounds normal Ext: 1-2+ edema lower extremities  Access: RIJ nontunneled catheter   Imaging: No results found.  Labs: BMET Recent Labs  Lab 02/09/19 0403 02/10/19 0332 02/11/19 0556 02/12/19 0605 02/13/19 0334 02/14/19 0336 02/14/19 2126 02/15/19 0749  NA 131* 133* 135 135 134* 133* 133* 134*  K 4.2 3.5 4.0 4.4 3.3* 4.2 5.1 3.7  CL 97* 98 98 99 98 96* 97* 96*  CO2 19* 21* 21* 21* 23 19* 15* 22  GLUCOSE 67* 72 78 81 108* 74 42* 85  BUN 100* 66* 74* 76* 41* 47* 54* 28*  CREATININE 3.60*  2.87* 3.58* 4.26* 3.21* 3.90* 4.56* 2.99*  CALCIUM 8.6* 8.4* 8.5* 8.7* 8.1* 8.6* 8.5* 8.4*  PHOS 5.0* 4.1 5.5* 6.4*  --  6.1*  --   --    CBC Recent Labs  Lab 02/12/19 0605  02/13/19 0334  02/14/19 0336 02/14/19 0920 02/14/19 1500 02/14/19 2126 02/15/19 0919  WBC 9.2  --  8.0  --  8.8  --   --  9.7  --   HGB 8.2*   < > 7.5*   < > 8.3* 8.5* 8.3* 8.3* 8.0*  HCT 24.4*   < > 23.0*   < > 25.6* 25.7* 25.1* 25.6* 24.0*  MCV 86.2  --  88.5  --  88.6  --   --  89.5  --   PLT 209  --  154  --  170  --   --  174  --    < > = values in this interval not displayed.     Medications:    . atorvastatin  40 mg Oral q1800  . Chlorhexidine Gluconate Cloth  6 each Topical Q0600  . Chlorhexidine Gluconate Cloth  6 each Topical Q0600  . feeding supplement (ENSURE ENLIVE)  237 mL Oral BID BM  . metoprolol succinate  12.5 mg Oral Daily  . pantoprazole  40 mg Oral Daily  . ursodiol  300 mg Oral BID  . Warfarin - Pharmacist Dosing Inpatient   Does not apply q1800   Claudia Desanctis  02/15/2019, 1:15 PM

## 2019-02-15 NOTE — Progress Notes (Signed)
Initial Nutrition Assessment  INTERVENTION:   If PO intakes do not improve, recommend liberalization of diet.  -Ensure Enlive po BID, each supplement provides 350 kcal and 20 grams of protein -Boost Breeze po TID, each supplement provides 250 kcal and 9 grams of protein  NUTRITION DIAGNOSIS:   Increased nutrient needs related to acute illness, chronic illness as evidenced by estimated needs.  GOAL:   Patient will meet greater than or equal to 90% of their needs  MONITOR:   PO intake, Supplement acceptance, Labs, Weight trends, I & O's, Skin  REASON FOR ASSESSMENT:   Consult Assessment of nutrition requirement/status  ASSESSMENT:   68 y.o. 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  9/15: admitted 10/2: began HD  **RD working remotely**  Patient not very communicative today per chart, alert/oriented x1. Patient's last HD was this morning. Next scheduled for 10/10.  Pt has been having hypoglycemic episodes. Currently consuming 0-10% of meals, sips of fluids. Has been ordered Ensure supplements, will add Boost Breeze with meals.   Admission weight: 138 lbs. Current weight: 158 lbs. I/Os: +539 ml since 9/24 HD UF today: 2L  Medications reviewed. Labs reviewed: CBGs: 90 Low Na GFR: 18  NUTRITION - FOCUSED PHYSICAL EXAM:  Unable to perform -working remotely.   Diet Order:   Diet Order            Diet renal with fluid restriction Fluid restriction: 1200 mL Fluid; Room service appropriate? Yes; Fluid consistency: Thin  Diet effective now              EDUCATION NEEDS:   No education needs have been identified at this time  Skin:  Skin Assessment: Skin Integrity Issues: Skin Integrity Issues:: Stage II Stage II: coccyx  Last BM:  10/5  Height:   Ht Readings from Last 1 Encounters:  01/24/19 5\' 2"   (1.575 m)    Weight:   Wt Readings from Last 1 Encounters:  02/15/19 72 kg    Ideal Body Weight:  50 kg  BMI:  Body mass index is 29.03 kg/m.  Estimated Nutritional Needs:   Kcal:  1650-1850  Protein:  85-95g  Fluid:  UOP + 1L  Clayton Bibles, MS, RD, LDN Inpatient Clinical Dietitian Pager: [phone] After Hours Pager: [phone]

## 2019-02-16 ENCOUNTER — Inpatient Hospital Stay (HOSPITAL_COMMUNITY): Payer: Medicare Other

## 2019-02-16 LAB — BASIC METABOLIC PANEL
Anion gap: 26 — ABNORMAL HIGH (ref 5–15)
BUN: 34 mg/dL — ABNORMAL HIGH (ref 8–23)
CO2: 14 mmol/L — ABNORMAL LOW (ref 22–32)
Calcium: 8.6 mg/dL — ABNORMAL LOW (ref 8.9–10.3)
Chloride: 95 mmol/L — ABNORMAL LOW (ref 98–111)
Creatinine, Ser: 3.82 mg/dL — ABNORMAL HIGH (ref 0.44–1.00)
GFR calc Af Amer: 13 mL/min — ABNORMAL LOW (ref >60)
GFR calc non Af Amer: 12 mL/min — ABNORMAL LOW (ref >60)
Glucose, Bld: 50 mg/dL — ABNORMAL LOW (ref 70–99)
Potassium: 4.4 mmol/L (ref 3.5–5.1)
Sodium: 135 mmol/L (ref 135–145)

## 2019-02-16 LAB — PROTIME-INR
INR: 4.6 (ref 0.8–1.2)
Prothrombin Time: 42.4 seconds — ABNORMAL HIGH (ref 11.4–15.2)

## 2019-02-16 LAB — AMMONIA: Ammonia: 73 umol/L — ABNORMAL HIGH (ref 9–35)

## 2019-02-16 LAB — GLUCOSE, CAPILLARY
Glucose-Capillary: 102 mg/dL — ABNORMAL HIGH (ref 70–99)
Glucose-Capillary: 40 mg/dL — CL (ref 70–99)
Glucose-Capillary: 53 mg/dL — ABNORMAL LOW (ref 70–99)
Glucose-Capillary: 71 mg/dL (ref 70–99)
Glucose-Capillary: 77 mg/dL (ref 70–99)
Glucose-Capillary: 83 mg/dL (ref 70–99)
Glucose-Capillary: 99 mg/dL (ref 70–99)

## 2019-02-16 MED ORDER — MORPHINE SULFATE (PF) 2 MG/ML IV SOLN
2.0000 mg | Freq: Once | INTRAVENOUS | Status: AC
  Administered 2019-02-16: 2 mg via INTRAVENOUS
  Filled 2019-02-16: qty 1

## 2019-02-16 MED ORDER — CHLORHEXIDINE GLUCONATE CLOTH 2 % EX PADS
6.0000 | MEDICATED_PAD | Freq: Every day | CUTANEOUS | Status: DC
  Administered 2019-02-17: 6 via TOPICAL

## 2019-02-16 MED ORDER — LACTULOSE 10 GM/15ML PO SOLN
20.0000 g | ORAL | Status: AC
  Administered 2019-02-16: 20 g via ORAL
  Filled 2019-02-16: qty 30

## 2019-02-16 MED ORDER — BISACODYL 10 MG RE SUPP
10.0000 mg | Freq: Once | RECTAL | Status: AC
  Administered 2019-02-16: 10 mg via RECTAL
  Filled 2019-02-16: qty 1

## 2019-02-16 MED ORDER — DEXTROSE 50 % IV SOLN
INTRAVENOUS | Status: AC
  Administered 2019-02-16: 50 mL
  Filled 2019-02-16: qty 50

## 2019-02-16 MED ORDER — DEXTROSE 10 % IV SOLN
INTRAVENOUS | Status: DC
  Administered 2019-02-16: 01:00:00 via INTRAVENOUS

## 2019-02-16 MED ORDER — LACTULOSE 10 GM/15ML PO SOLN
10.0000 g | Freq: Three times a day (TID) | ORAL | Status: DC
  Administered 2019-02-17: 10 g via ORAL
  Filled 2019-02-16 (×2): qty 15

## 2019-02-16 MED ORDER — LACTULOSE ENEMA
300.0000 mL | Freq: Once | ORAL | Status: AC
  Administered 2019-02-17: 300 mL via RECTAL
  Filled 2019-02-16: qty 300

## 2019-02-16 NOTE — Progress Notes (Signed)
PROGRESS NOTE    Marie Malone  IWL:[id number] DOB: May 29, 1950 DOA: 01/29/2019 PCP: Debbrah Alar, NP    Brief Narrative:  68 y.o. 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 Patient states over the last 2 weeks patient has been getting progressively short of breath on exertion. Denies any chest pain fever chills or productive cough. Patient's primary care physician advised her to increase her Lasix twice daily which she is supposed to start today. Because of worsening shortness of breath patient came to the ER. EMS was called and patient initially was placed on BiPAP. Patient also had complained of some left calf cramping.  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   S/P CABG x 1   Acute on chronic systolic CHF (congestive heart failure) (HCC)   Atrial fibrillation, chronic (HCC)   Cellulitis of left leg   Acute renal failure (HCC)   Primary biliary cirrhosis (HCC)   Chest pain at rest   H/O mitral valve replacement with mechanical valve   Atypical atrial flutter (HCC)   AKI (acute kidney injury) (HCC)   Hematochezia   Supratherapeutic INR   Pressure injury of skin  LLE cellulitis - Bld Cx, UCxnegative - afebrile, WBC normal, abx completed; leg is better - noted to have been resolved  Generalized edema - suspect secondary to ARF below -Pt continued on HD as tolerated per Nephrology  Acute renal failure (baseline Cr 1.0)/ATN NGMA Hyponatremia Hyperkalemia - Nephrology following, appreciate assistance -Potassium stable - Nephrology has  been following for consideration for long-term HD - HD had originally been planned for MWF, next HD is for 10/10 -continue to follow lytes  Acute respiratory failure with hypoxia - resolved, remains stable on minimal O2 support  Chest pain acute started 9/21 Elevated Troponin -Patient borderline hypotensive with MAP 60-63 -EKG: atrial flutter, nonspecific ST-T wave changes no changes from previous EKG -Troponin high-sensitivity 791 -> 718 - Pt was seen by Cardiology, remains stable  Acute on chronic systolic CHF - follow I&O and daily wts  - EF 40-45% - cards consulted - Pt is continued on metoprolol XL 12.5mg , stable  Mechanical mitral valve -Continue warfarin, monitor INR - Therapeutic INR goal 2.5-3.5 -INR up to 4.6 today - continue coumadin dosing per Pharmacy -No evidence of acute bleeding at this time  Atrial fibrillation chronic -Currently NSR -Anticoagulated for her mechanical mitral valve - Cardiology had considered DCCV, however on re-eval, pt uncertain to be a good candidate for TEE/DCCV given low likelihood of maintaining sinus rhythm with moderate to severe biatrial enlargement and mild cardiomyopathy     - coumadin is continued with dosing per Pharmacy per above  Primary biliary cirrhosis(PBC) -Continued with Ursodial 300 mg BID.  - Continue with benadryl for itching -Recent LFT's appear to trend up -Pt with worsening toxic metabolic encephalopahty, see below -Ammonia checked and reviewed, elevated at over 70. -Pt without significant BM since admit. Given trial of lactulose and will schedule with goal of 2-3 BM daily -Repeat LFT and ammonia in AM  Diarrhea -9/22 patient reports diarrhea to cardiology - Fecal fat wnl - GI pathogen panel by PCR negative  -  Lactoferrin negative - resolved, however, now issues with constipation  Acute blood loss  Anemia GIB -No endoscopy planned; GI has s/o'd -Hgb remains stable currently  Toxic metabolic encephalopathy -Daughter at bedside reports mentation is much worse than baseline -Pt noted to be hypoglycemic past several days, now on D10 IVF -Continue hypoglycemic protocol -Ammonia noted to be elevated in the 70's. Have started trial of lactulose and will schedule with goal of 2-3 BM daily -Repeat ammonia level in AM  Hypoglycemia -Pt with recurrent bouts of hypoglycemia noted over past several days. Reportedly not tolerating PO very well -Will change to regular diet per Dietician recs -Have increased IVF to D10 at 75cc/hr  DVT prophylaxis: Coumadin Code Status: Full Family Communication: Pt in room, family at bedside Disposition Plan: Uncertain at this time  Consultants:   Nephrology  Cardiology  Procedures:     Antimicrobials: Anti-infectives (From admission, onward)   Start     Dose/Rate Route Frequency Ordered Stop   02/05/19 1947  ceFAZolin (ANCEF) IVPB 2g/100 mL premix  Status:  Discontinued     2 g 200 mL/hr over 30 Minutes Intravenous Every 12 hours 02/05/19 1948 02/09/19 1404   02/04/19 1730  ceFAZolin (ANCEF) IVPB 2g/100 mL premix  Status:  Discontinued     2 g 200 mL/hr over 30 Minutes Intravenous Every 12 hours 02/04/19 0948 02/05/19 1948   02/01/19 2000  ceFAZolin (ANCEF) IVPB 2g/100 mL premix  Status:  Discontinued     2 g 200 mL/hr over 30 Minutes Intravenous Every 8 hours 02/01/19 1524 02/04/19 0948   02/01/19 1300  vancomycin (VANCOCIN) IVPB 750 mg/150 ml premix  Status:  Discontinued     750 mg 150 mL/hr over 60 Minutes Intravenous Every 24 hours 01/31/19 1152 02/01/19 1519   01/31/19 1300  vancomycin (VANCOCIN) 1,250 mg in sodium chloride 0.9 % 250 mL IVPB     1,250 mg 166.7 mL/hr over 90 Minutes Intravenous  Once 01/31/19 1152 01/31/19 1448   01/27/19 0930  cefTRIAXone (ROCEPHIN) 2 g in sodium chloride 0.9 % 100 mL IVPB  Status:  Discontinued      2 g 200 mL/hr over 30 Minutes Intravenous Every 24 hours 01/27/19 0824 02/01/19 1519      Subjective: Noted to be hypoglycemic overnight, lethargic this AM  Objective: Vitals:   02/15/19 2101 02/16/19 0018 02/16/19 0441 02/16/19 0739  BP:  (!) 109/56 (!) 123/52 (!) 101/46  Pulse: 87 91  87  Resp: 18 19 12 13   Temp:  97.7 F (36.5 C)  (!) 97.4 F (36.3 C)  TempSrc:  Axillary  Oral  SpO2: 99% 100% 100% 98%  Weight:   70 kg   Height:        Intake/Output Summary (Last 24 hours) at 02/16/2019 1453 Last data filed at 02/16/2019 0500 Gross per 24 hour  Intake 155.56 ml  Output 20 ml  Net 135.56 ml   Filed Weights   02/15/19 0215 02/15/19 0550 02/16/19 0441  Weight: 75 kg 72 kg 70 kg    Examination: General exam: Somewhat conversant, appears to be in mild pain Respiratory system: normal chest rise, clear, no audible wheezing Cardiovascular system: regular rhythm, s1-s2 Gastrointestinal system: Nondistended, nontender, pos BS Central nervous system: No seizures, no tremors Extremities: No cyanosis, no joint deformities Skin: No rashes, no pallor Psychiatry: Difficult to assess given mental acuity   Data Reviewed: I have personally reviewed following labs and imaging studies  CBC: Recent Labs  Lab 02/11/19 0556  02/12/19 0605  02/13/19 0334  02/14/19 0336 02/14/19 0920 02/14/19 1500 02/14/19 2126 02/15/19 0919 02/15/19 1600  WBC 9.5  --  9.2  --  8.0  --  8.8  --   --  9.7  --   --   HGB 8.2*   < > 8.2*   < > 7.5*   < > 8.3* 8.5* 8.3* 8.3* 8.0* 8.0*  HCT 24.9*   < > 24.4*   < > 23.0*   < > 25.6* 25.7* 25.1* 25.6* 24.0* 24.3*  MCV 88.9  --  86.2  --  88.5  --  88.6  --   --  89.5  --   --   PLT 215  --  209  --  154  --  170  --   --  174  --   --    < > = values in this interval not displayed.   Basic Metabolic Panel: Recent Labs  Lab 02/10/19 0332 02/11/19 0556 02/12/19 0605 02/13/19 0334 02/14/19 0336 02/14/19 2126 02/15/19 0749 02/16/19 0456  NA  133* 135 135 134* 133* 133* 134* 135  K 3.5 4.0 4.4 3.3* 4.2 5.1 3.7 4.4  CL 98 98 99 98 96* 97* 96* 95*  CO2 21* 21* 21* 23 19* 15* 22 14*  GLUCOSE 72 78 81 108* 74 42* 85 50*  BUN 66* 74* 76* 41* 47* 54* 28* 34*  CREATININE 2.87* 3.58* 4.26* 3.21* 3.90* 4.56* 2.99* 3.82*  CALCIUM 8.4* 8.5* 8.7* 8.1* 8.6* 8.5* 8.4* 8.6*  MG 2.2 2.2 2.4 2.0 2.2  --   --   --   PHOS 4.1 5.5* 6.4*  --  6.1*  --   --   --    GFR: Estimated Creatinine Clearance: 13.1 mL/min (A) (by C-G formula based on SCr of 3.82 mg/dL (H)). Liver Function Tests: Recent Labs  Lab 02/12/19 0605 02/12/19 1330 02/13/19 0334 02/14/19 0336 02/14/19 2126  AST  --  152* 153*  --  278*  ALT  --  <5 5  --  5  ALKPHOS  --  67 61  --  70  BILITOT  --  2.2* 1.7*  --  2.6*  PROT  --  9.1* 8.8*  --  9.9*  ALBUMIN 2.1* 2.2* 2.0* 2.1* 2.2*   No results for input(s): LIPASE, AMYLASE in the last 168 hours. Recent Labs  Lab 02/10/19 1124  AMMONIA 34   Coagulation Profile: Recent Labs  Lab 02/13/19 0334 02/14/19 0336 02/14/19 2126 02/15/19 0749 02/16/19 0456  INR 2.9* 2.8* 3.6* 3.9* 4.6*   Cardiac Enzymes: No results for input(s): CKTOTAL, CKMB, CKMBINDEX, TROPONINI in the last 168 hours. BNP (last 3 results) No results for input(s): PROBNP in the last 8760 hours. HbA1C: No results for input(s): HGBA1C in the last 72 hours. CBG: Recent Labs  Lab 02/16/19 0013 02/16/19 0420 02/16/19 0451 02/16/19 0736 02/16/19 1114  GLUCAP 99 40* 102* 77 71   Lipid Profile: No results for input(s): CHOL, HDL, LDLCALC, TRIG, CHOLHDL, LDLDIRECT in the last 72 hours. Thyroid Function Tests: No results for input(s): TSH, T4TOTAL, FREET4, T3FREE, THYROIDAB in the last 72 hours. Anemia Panel: No results for input(s): VITAMINB12, FOLATE, FERRITIN, TIBC, IRON, RETICCTPCT in the last 72 hours. Sepsis Labs: No results for input(s): PROCALCITON, LATICACIDVEN in the last 168 hours.  No results found for this or any previous visit  (from the past 240 hour(s)).   Radiology Studies: Dg Abd 1 View  Result  Date: 02/16/2019 CLINICAL DATA:  Abdominal pain. EXAM: ABDOMEN - 1 VIEW COMPARISON:  Radiograph of March 19, 2016. FINDINGS: The bowel gas pattern is normal. Residual contrast is noted in the descending colon and rectum. No radio-opaque calculi or other significant radiographic abnormality are seen. IMPRESSION: No evidence of bowel obstruction or ileus. Electronically Signed   By: Marijo Conception M.D.   On: 02/16/2019 11:55    Scheduled Meds: . atorvastatin  40 mg Oral q1800  . bisacodyl  10 mg Rectal Once  . Chlorhexidine Gluconate Cloth  6 each Topical Q0600  . Chlorhexidine Gluconate Cloth  6 each Topical Q0600  . [START ON 02/22/2019] darbepoetin (ARANESP) injection - DIALYSIS  60 mcg Subcutaneous Q7 days  . feeding supplement  1 Container Oral TID WC  . feeding supplement (ENSURE ENLIVE)  237 mL Oral BID BM  . lactulose  20 g Oral NOW  . metoprolol succinate  12.5 mg Oral Daily  . pantoprazole  40 mg Oral Daily  . ursodiol  300 mg Oral BID  . Warfarin - Pharmacist Dosing Inpatient   Does not apply q1800   Continuous Infusions: . dextrose 75 mL/hr at 02/16/19 1012     LOS: 23 days   Marylu Lund, MD [facility] Pager On Amion  If 7PM-7AM, please contact night-coverage 02/16/2019, 2:53 PM

## 2019-02-16 NOTE — Care Management Important Message (Signed)
Important Message  Patient Details  Name: Marie Malone MRN: [id number] Date of Birth: [birth year]   Medicare Important Message Given:  Yes     Shelda Altes 02/16/2019, 1:36 PM

## 2019-02-16 NOTE — Progress Notes (Signed)
[name] blood sugar keeps dropping below 70. Paged Blount who agreed to change D5 to D10 still at [facility]. Pt also shows signs of being in pain. Pt is lethargic and not comfortable giving po pain med, Blount agreed to one time dose of IV morphine. Will continue to monitor.

## 2019-02-16 NOTE — Progress Notes (Signed)
CRITICAL VALUE ALERT  Critical Value:  INR 4.6  Date & Time Notied:  02/16/2019 0535   Provider Notified: Kennon Holter  Orders Received/Actions taken:

## 2019-02-16 NOTE — Progress Notes (Signed)
ANTICOAGULATION CONSULT NOTE  Pharmacy Consult for warfarin + heparin Indication: atrial fibrillation and mechanical MVR  Vital Signs: Temp: 97.4 F (36.3 C) (10/09 0739) Temp Source: Oral (10/09 0739) BP: 101/46 (10/09 0739) Pulse Rate: 87 (10/09 0739)  Labs: Recent Labs    02/14/19 0336  02/14/19 2126 02/15/19 0749 02/15/19 0919 02/15/19 1600 02/16/19 0456  HGB 8.3*   < > 8.3*  --  8.0* 8.0*  --   HCT 25.6*   < > 25.6*  --  24.0* 24.3*  --   PLT 170  --  174  --   --   --   --   LABPROT 29.1*  --  35.0* 37.3*  --   --  42.4*  INR 2.8*  --  3.6* 3.9*  --   --  4.6*  CREATININE 3.90*  --  4.56* 2.99*  --   --  3.82*   < > = values in this interval not displayed.    Estimated Creatinine Clearance: 13.1 mL/min (A) (by C-G formula based on SCr of 3.82 mg/dL (H)).  Assessment: 68 y.o. female admitted with CHF/SOB, h/o Afib and mechanical MVR, to continue warfarin. PTA regimen is 4 mg daily, noted prior goal 2-3 has been adjusted to 2.5-3.5 this admit by cardiology. INR has been elevated likely due to vitamin K deficiency. Vitamin K given 9/22 and again 9/29 and 9/30.  INR this morning now above goal at 4.6. Will continue to hold warfarin. Pharmacy previously consulted to start IV heparin if INR < 2.5, will discontinue this consult given labile/high INR.  **PTA warfarin dose = 4mg  daily  Goal of Therapy:  INR 2.5-3.5 Monitor platelets by anticoagulation protocol: Yes    Plan:  -Hold warfarin tonight  -Daily INR, CBC -Watch S/Sx bleeding closely   Arrie Senate, PharmD, BCPS Clinical Pharmacist [phone] Please check AMION for all [place] numbers 02/16/2019

## 2019-02-16 NOTE — Progress Notes (Signed)
Patient ID: Marie Malone, female   DOB: 1950-05-12, 68 y.o.   MRN: [id number] [facility] Progress Note   Assessment/ Plan:   1.  Acute kidney injury:  remains oliguric.  Differential diagnosis include ATN and acute interstitial nephritis (AIN) with available labs favoring hemodynamically mediated AKI/ATN.  Renal ultrasound negative for any obstruction   Previous urine studies indicate above decreased effective arterial blood volume (likely suggestive of CRS) with renal hypoperfusion injury and worsening renal function with diuretics.  Started on dialysis on 10/2 via nontunneled catheter - had on 10/2, 10/5 and overnight between 107 and 10/8 - baseline Cr mid to low 1's - was 1.66 on 9/26 - Next HD planned  for Saturday 10/10 - BUN is only 34 so dont suspect is causing this decreased LOC  - Would avoid hypotension; noted metoprolol is for atypical a flutter  - reassess tunneled catheter placement after team adjusts medications; they are also managing hypoglycemia.  Less communicative today or yesterday.   2.  Hypoxic respiratory failure: Secondary to acute exacerbation of diastolic heart failure in this patient with pulmonary hypertension.  Optimize volume with HD as tolerated -  Getting IVF for sugar-  Is already volume overloaded    3.  Anemia: secondary to chronic disease and recent acute loss.  iron is replete.  aranesp given 10/8.   5.  History of mechanical mitral valve: On anticoagulation per primary team. Hx supratherapeutic INR   6.  History of primary biliary cirrhosis   7. Atypical flutter   - on metoprolol per cardiology   8. Acute on chronic combined CHF  - optimize volume status with HD as able   Subjective:   Having some decreased LOC - thought due to hypoglycemia but primary not entirely sure - hemodynamically pretty stable- hypothermic - trying to minimize sedating meds and checking ammonia    Objective:   BP (!) 101/46 (BP Location: Left Arm)   Pulse  87   Temp (!) 97.4 F (36.3 C) (Oral)   Resp 13   Ht 5\' 2"  (1.575 m)   Wt 70 kg   SpO2 98%   BMI 28.23 kg/m   Intake/Output Summary (Last 24 hours) at 02/16/2019 1439 Last data filed at 02/16/2019 0500 Gross per 24 hour  Intake 155.56 ml  Output 20 ml  Net 135.56 ml   Weight change: -5 kg  Physical Exam:  Gen: adult female in bed not communicative -does not answer questions CVS: Pulse regular rhythm, normal rate, normal S1 and S2 Resp: clear anteriorly and unlabored  Abd: Soft, slightly distended, bowel sounds normal Ext: 1-2+ edema lower extremities  Access: RIJ nontunneled catheter placed 10/2  Imaging: Dg Abd 1 View  Result Date: 02/16/2019 CLINICAL DATA:  Abdominal pain. EXAM: ABDOMEN - 1 VIEW COMPARISON:  Radiograph of March 19, 2016. FINDINGS: The bowel gas pattern is normal. Residual contrast is noted in the descending colon and rectum. No radio-opaque calculi or other significant radiographic abnormality are seen. IMPRESSION: No evidence of bowel obstruction or ileus. Electronically Signed   By: Marijo Conception M.D.   On: 02/16/2019 11:55    Labs: BMET Recent Labs  Lab 02/10/19 0332 02/11/19 0556 02/12/19 0605 02/13/19 0334 02/14/19 0336 02/14/19 2126 02/15/19 0749 02/16/19 0456  NA 133* 135 135 134* 133* 133* 134* 135  K 3.5 4.0 4.4 3.3* 4.2 5.1 3.7 4.4  CL 98 98 99 98 96* 97* 96* 95*  CO2 21* 21* 21* 23 19* 15*  22 14*  GLUCOSE 72 78 81 108* 74 42* 85 50*  BUN 66* 74* 76* 41* 47* 54* 28* 34*  CREATININE 2.87* 3.58* 4.26* 3.21* 3.90* 4.56* 2.99* 3.82*  CALCIUM 8.4* 8.5* 8.7* 8.1* 8.6* 8.5* 8.4* 8.6*  PHOS 4.1 5.5* 6.4*  --  6.1*  --   --   --    CBC Recent Labs  Lab 02/12/19 0605  02/13/19 0334  02/14/19 0336  02/14/19 1500 02/14/19 2126 02/15/19 0919 02/15/19 1600  WBC 9.2  --  8.0  --  8.8  --   --  9.7  --   --   HGB 8.2*   < > 7.5*   < > 8.3*   < > 8.3* 8.3* 8.0* 8.0*  HCT 24.4*   < > 23.0*   < > 25.6*   < > 25.1* 25.6* 24.0* 24.3*  MCV  86.2  --  88.5  --  88.6  --   --  89.5  --   --   PLT 209  --  154  --  170  --   --  174  --   --    < > = values in this interval not displayed.    Medications:    . atorvastatin  40 mg Oral q1800  . Chlorhexidine Gluconate Cloth  6 each Topical Q0600  . Chlorhexidine Gluconate Cloth  6 each Topical Q0600  . [START ON 02/22/2019] darbepoetin (ARANESP) injection - DIALYSIS  60 mcg Subcutaneous Q7 days  . feeding supplement  1 Container Oral TID WC  . feeding supplement (ENSURE ENLIVE)  237 mL Oral BID BM  . metoprolol succinate  12.5 mg Oral Daily  . pantoprazole  40 mg Oral Daily  . ursodiol  300 mg Oral BID  . Warfarin - Pharmacist Dosing Inpatient   Does not apply [place]  02/16/2019, 2:39 PM

## 2019-02-16 NOTE — Progress Notes (Signed)
PT Cancellation Note  Patient Details Name: Marie Malone MRN: [id number] DOB: [birth year]   Cancelled Treatment:    Reason Eval/Treat Not Completed: Other (comment). Attempted this AM but pt in pain and lethargic. Will continue to follow.   [name] 02/16/2019, 5:01 PM [place] Pager [phone] Office [phone]

## 2019-02-17 ENCOUNTER — Inpatient Hospital Stay (HOSPITAL_COMMUNITY): Payer: Medicare Other

## 2019-02-17 LAB — COMPREHENSIVE METABOLIC PANEL
ALT: 29 U/L (ref 0–44)
AST: 1184 U/L — ABNORMAL HIGH (ref 15–41)
Albumin: 2.1 g/dL — ABNORMAL LOW (ref 3.5–5.0)
Alkaline Phosphatase: 84 U/L (ref 38–126)
Anion gap: 30 — ABNORMAL HIGH (ref 5–15)
BUN: 39 mg/dL — ABNORMAL HIGH (ref 8–23)
CO2: 9 mmol/L — ABNORMAL LOW (ref 22–32)
Calcium: 8.2 mg/dL — ABNORMAL LOW (ref 8.9–10.3)
Chloride: 94 mmol/L — ABNORMAL LOW (ref 98–111)
Creatinine, Ser: 4.46 mg/dL — ABNORMAL HIGH (ref 0.44–1.00)
GFR calc Af Amer: 11 mL/min — ABNORMAL LOW (ref >60)
GFR calc non Af Amer: 10 mL/min — ABNORMAL LOW (ref >60)
Glucose, Bld: 152 mg/dL — ABNORMAL HIGH (ref 70–99)
Potassium: 3.9 mmol/L (ref 3.5–5.1)
Sodium: 133 mmol/L — ABNORMAL LOW (ref 135–145)
Total Bilirubin: 2.9 mg/dL — ABNORMAL HIGH (ref 0.3–1.2)
Total Protein: 9.3 g/dL — ABNORMAL HIGH (ref 6.5–8.1)

## 2019-02-17 LAB — GLUCOSE, CAPILLARY
Glucose-Capillary: 113 mg/dL — ABNORMAL HIGH (ref 70–99)
Glucose-Capillary: 115 mg/dL — ABNORMAL HIGH (ref 70–99)
Glucose-Capillary: 117 mg/dL — ABNORMAL HIGH (ref 70–99)
Glucose-Capillary: 125 mg/dL — ABNORMAL HIGH (ref 70–99)
Glucose-Capillary: 127 mg/dL — ABNORMAL HIGH (ref 70–99)
Glucose-Capillary: 131 mg/dL — ABNORMAL HIGH (ref 70–99)
Glucose-Capillary: 57 mg/dL — ABNORMAL LOW (ref 70–99)
Glucose-Capillary: 59 mg/dL — ABNORMAL LOW (ref 70–99)
Glucose-Capillary: 74 mg/dL (ref 70–99)

## 2019-02-17 LAB — CBC
HCT: 26.1 % — ABNORMAL LOW (ref 36.0–46.0)
Hemoglobin: 8 g/dL — ABNORMAL LOW (ref 12.0–15.0)
MCH: 29.1 pg (ref 26.0–34.0)
MCHC: 30.7 g/dL (ref 30.0–36.0)
MCV: 94.9 fL (ref 80.0–100.0)
Platelets: 104 10*3/uL — ABNORMAL LOW (ref 150–400)
RBC: 2.75 MIL/uL — ABNORMAL LOW (ref 3.87–5.11)
RDW: 22.6 % — ABNORMAL HIGH (ref 11.5–15.5)
WBC: 18.2 10*3/uL — ABNORMAL HIGH (ref 4.0–10.5)
nRBC: 2.2 % — ABNORMAL HIGH (ref 0.0–0.2)

## 2019-02-17 LAB — PROTIME-INR
INR: 7.7 (ref 0.8–1.2)
Prothrombin Time: 63.6 seconds — ABNORMAL HIGH (ref 11.4–15.2)

## 2019-02-17 LAB — AMMONIA: Ammonia: 84 umol/L — ABNORMAL HIGH (ref 9–35)

## 2019-02-17 MED ORDER — DEXTROSE 10 % IV SOLN: INTRAVENOUS | Status: AC

## 2019-02-17 MED ORDER — MORPHINE SULFATE (CONCENTRATE) 10 MG/0.5ML PO SOLN: 10.0000 mg | ORAL | Status: DC | PRN

## 2019-02-17 MED ORDER — HEPARIN SODIUM (PORCINE) 1000 UNIT/ML DIALYSIS
20.0000 [IU]/kg | INTRAMUSCULAR | Status: DC | PRN
  Administered 2019-02-17: 14:00:00 1400 [IU] via INTRAVENOUS_CENTRAL
  Filled 2019-02-17 (×2): qty 2

## 2019-02-17 MED ORDER — DEXTROSE 50 % IV SOLN
INTRAVENOUS | Status: AC
  Administered 2019-02-17: 19:00:00
  Filled 2019-02-17: qty 50

## 2019-02-17 MED ORDER — ALBUMIN HUMAN 25 % IV SOLN
25.0000 g | Freq: Once | INTRAVENOUS | Status: AC
  Administered 2019-02-17: 16:00:00 25 g via INTRAVENOUS

## 2019-02-17 MED ORDER — DEXTROSE 50 % IV SOLN
INTRAVENOUS | Status: AC
  Administered 2019-02-17: 25 mL
  Filled 2019-02-17: qty 50

## 2019-02-17 MED ORDER — ALBUMIN HUMAN 5 % IV SOLN: 25.0000 g | Freq: Once | INTRAVENOUS | Status: DC

## 2019-02-17 MED ORDER — ALBUMIN HUMAN 25 % IV SOLN
INTRAVENOUS | Status: AC
  Administered 2019-02-17: 25 g via INTRAVENOUS
  Filled 2019-02-17: qty 100

## 2019-02-17 MED ORDER — DEXTROSE 50 % IV SOLN
25.0000 mL | Freq: Once | INTRAVENOUS | Status: AC
  Administered 2019-02-17: 23:00:00 25 mL via INTRAVENOUS

## 2019-02-17 MED ORDER — DEXTROSE 50 % IV SOLN
INTRAVENOUS | Status: AC
  Administered 2019-02-17: 25 mL via INTRAVENOUS
  Filled 2019-02-17: qty 50

## 2019-02-17 MED ORDER — HEPARIN SODIUM (PORCINE) 1000 UNIT/ML IJ SOLN
INTRAMUSCULAR | Status: AC
  Administered 2019-02-17: 1400 [IU] via INTRAVENOUS_CENTRAL
  Filled 2019-02-17: qty 2

## 2019-02-17 MED ORDER — ALBUMIN HUMAN 25 % IV SOLN
INTRAVENOUS | Status: AC
  Administered 2019-02-17: 25 g via INTRAVENOUS
  Filled 2019-02-17: qty 50

## 2019-02-17 MED ORDER — MORPHINE SULFATE (CONCENTRATE) 10 MG/0.5ML PO SOLN
5.0000 mg | ORAL | Status: DC | PRN
  Administered 2019-02-17: 5 mg via SUBLINGUAL
  Filled 2019-02-17: qty 0.5

## 2019-02-17 MED ORDER — HEPARIN SODIUM (PORCINE) 1000 UNIT/ML IJ SOLN
INTRAMUSCULAR | Status: AC
  Administered 2019-02-17: 2600 [IU] via INTRAVENOUS
  Filled 2019-02-17: qty 4

## 2019-02-17 MED ORDER — METOPROLOL TARTRATE 5 MG/5ML IV SOLN
2.5000 mg | Freq: Four times a day (QID) | INTRAVENOUS | Status: DC
  Administered 2019-02-17: 2.5 mg via INTRAVENOUS
  Filled 2019-02-17: qty 5

## 2019-02-17 MED ORDER — ALBUMIN HUMAN 25 % IV SOLN
25.0000 g | Freq: Once | INTRAVENOUS | Status: AC
  Administered 2019-02-17: 15:00:00 25 g via INTRAVENOUS

## 2019-02-17 MED ORDER — MORPHINE SULFATE (CONCENTRATE) 10 MG/0.5ML PO SOLN
5.0000 mg | ORAL | Status: DC | PRN
  Administered 2019-02-17: 5 mg via SUBLINGUAL
  Filled 2019-02-17 (×2): qty 0.5

## 2019-02-17 NOTE — Progress Notes (Signed)
PROGRESS NOTE    Marie Malone  AJO:[id number] DOB: [birth year] DOA: 02/06/2019 PCP: Debbrah Alar, NP    Brief Narrative:  68 y.o. 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 Patient states over the last 2 weeks patient has been getting progressively short of breath on exertion. Denies any chest pain fever chills or productive cough. Patient's primary care physician advised her to increase her Lasix twice daily which she is supposed to start today. Because of worsening shortness of breath patient came to the ER. EMS was called and patient initially was placed on BiPAP. Patient also had complained of some left calf cramping.  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   S/P CABG x 1   Acute on chronic systolic CHF (congestive heart failure) (HCC)   Atrial fibrillation, chronic (HCC)   Cellulitis of left leg   Acute renal failure (HCC)   Primary biliary cirrhosis (HCC)   Chest pain at rest   H/O mitral valve replacement with mechanical valve   Atypical atrial flutter (HCC)   AKI (acute kidney injury) (HCC)   Hematochezia   Supratherapeutic INR   Pressure injury of skin  LLE cellulitis - Bld Cx, UCxnegative - afebrile, WBC normal, abx completed; leg is better - seemed to have resolved  Anasarca - suspect secondary to ARF below -Was on D10 IVF for hypoglycemia, now held given hypoxemia this AM -Pt to undergo HD today  Acute renal failure (baseline Cr 1.0)/ATN NGMA Hyponatremia Hyperkalemia - Nephrology following, appreciate assistance -  Nephrology had initially considered plans for long-term HD - HD had originally been planned for MWF. HD today - See below. Palliative Care consulted for [place]. Question if pt is candidate for long-term HD given worsening liver function  Acute respiratory failure with hypoxia -On minimal O2 support currently  Chest pain acute started 9/21 Elevated Troponin -Patient borderline hypotensive with MAP 60-63 -EKG: atrial flutter, nonspecific ST-T wave changes no changes from previous EKG -Troponin high-sensitivity 791 -> 718 - Pt was earlier seen by Cardiology, remains stable  Acute on chronic systolic CHF - follow I&O and daily wts  - EF 40-45% - Cardiology had been consulted earlier - Pt is continued on scheduled beta blocker. Will transition to IV given change in mentation  Mechanical mitral valve -Continue warfarin, monitor INR - Therapeutic INR goal 2.5-3.5 -INR up to 7.7 today - Pharmacy monitoring coumadin. Will likely cont to hold  Atrial fibrillation chronic -Anticoagulated for her mechanical mitral valve - Cardiology had considered DCCV, however on re-eval, pt uncertain to be a good candidate for TEE/DCCV given low likelihood of maintaining sinus rhythm with moderate to severe biatrial enlargement and mild cardiomyopathy     - INR supertherapeutic with coumadin on hold per pharmacy  Primary biliary cirrhosis(PBC) -Continued with Ursodial 300 mg BID.  - Continue with benadryl for itching -Recent LFT's are continuing to trend up -Pt with worsening toxic metabolic encephalopahty, see below -Ammonia checked and reviewed, rising, up to 84 despite large BM following lactulose overnight -Continued on scheduled lactulose -Cont to follow LFT's -Have consulted GI. Appreciate input. Concerns of acute worsening of liver function related to  large volume shifts from HD in the setting of chronic cirrhosis.  Recommendation for Palliative Care consultation  Diarrhea -9/22 patient reports diarrhea to cardiology - Fecal fat wnl - GI pathogen panel by PCR negative  - Lactoferrin negative - resolved, however, now issues with constipation  Acute blood loss Anemia GIB -No endoscopy planned; GI has s/o'd -Hgb remains stable currently  Toxic metabolic encephalopathy -Suspect hepatic encephalopathy -Stool noted with lactulose, however ammonia cont to trend up -Pt is awake, however mentation is worse than baseline per family -Will continue scheduled lactulose as tolerated  Hypoglycemia -Pt with recurrent bouts of hypoglycemia noted over past several days. Reportedly not tolerating PO very well -Have changed to regular diet per Dietician recs -Had been continued on D10 IVF, however now stopped given concerns of volume overload this AM  DVT prophylaxis: Coumadin Code Status: Full Family Communication: Pt in room, family at bedside Disposition Plan: Uncertain at this time  Consultants:   Nephrology  Cardiology  GI  Palliative Care  Procedures:     Antimicrobials: Anti-infectives (From admission, onward)   Start     Dose/Rate Route Frequency Ordered Stop   02/05/19 1947  ceFAZolin (ANCEF) IVPB 2g/100 mL premix  Status:  Discontinued     2 g 200 mL/hr over 30 Minutes Intravenous Every 12 hours 02/05/19 1948 02/09/19 1404   02/04/19 1730  ceFAZolin (ANCEF) IVPB 2g/100 mL premix  Status:  Discontinued     2 g 200 mL/hr over 30 Minutes Intravenous Every 12 hours 02/04/19 0948 02/05/19 1948   02/01/19 2000  ceFAZolin (ANCEF) IVPB 2g/100 mL premix  Status:  Discontinued     2 g 200 mL/hr over 30 Minutes Intravenous Every 8 hours 02/01/19 1524 02/04/19 0948   02/01/19 1300  vancomycin (VANCOCIN) IVPB 750 mg/150 ml premix  Status:  Discontinued     750 mg 150 mL/hr over 60 Minutes Intravenous Every 24 hours 01/31/19 1152 02/01/19 1519   01/31/19 1300   vancomycin (VANCOCIN) 1,250 mg in sodium chloride 0.9 % 250 mL IVPB     1,250 mg 166.7 mL/hr over 90 Minutes Intravenous  Once 01/31/19 1152 01/31/19 1448   01/27/19 0930  cefTRIAXone (ROCEPHIN) 2 g in sodium chloride 0.9 % 100 mL IVPB  Status:  Discontinued     2 g 200 mL/hr over 30 Minutes Intravenous Every 24 hours 01/27/19 0824 02/01/19 1519      Subjective: Unable to assess given current level of mentation  Objective: Vitals:   02/17/19 0528 02/17/19 0553 02/17/19 0800 02/17/19 1121  BP:   (!) 112/49 (!) 121/50  Pulse:  88 76 91  Resp:    (!) 23  Temp: (!) 97.4 F (36.3 C) (!) 97.3 F (36.3 C) (!) 97.5 F (36.4 C) 98.2 F (36.8 C)  TempSrc: Oral Oral Axillary Axillary  SpO2:  91%  93%  Weight:      Height:        Intake/Output Summary (Last 24 hours) at 02/17/2019 1433 Last data filed at 02/17/2019 0819 Gross per 24 hour  Intake 50 ml  Output -  Net 50 ml   Filed Weights   02/15/19 0550 02/16/19 0441 02/17/19 0500  Weight: 72 kg 70 kg 71 kg    Examination: General exam: Awake, laying in bed, appears to be in pain Respiratory system: Normal respiratory effort, no wheezing Cardiovascular system: regular rate, s1, s2 Gastrointestinal system: Soft, nondistended, positive BS Central nervous system: CN2-12 grossly intact, strength intact Extremities: Perfused, no clubbing  Skin: Normal skin turgor, no notable skin lesions seen Psychiatry: Mood normal // no visual hallucinations   Data Reviewed: I have personally reviewed following labs and imaging studies  CBC: Recent Labs  Lab 02/12/19 0605  02/13/19 0334  02/14/19 0336  02/14/19 1500 02/14/19 2126 02/15/19 0919 02/15/19 1600 02/17/19 0700  WBC 9.2  --  8.0  --  8.8  --   --  9.7  --   --  18.2*  HGB 8.2*   < > 7.5*   < > 8.3*   < > 8.3* 8.3* 8.0* 8.0* 8.0*  HCT 24.4*   < > 23.0*   < > 25.6*   < > 25.1* 25.6* 24.0* 24.3* 26.1*  MCV 86.2  --  88.5  --  88.6  --   --  89.5  --   --  94.9  PLT 209  --   154  --  170  --   --  174  --   --  104*   < > = values in this interval not displayed.   Basic Metabolic Panel: Recent Labs  Lab 02/11/19 0556 02/12/19 0605 02/13/19 0334 02/14/19 0336 02/14/19 2126 02/15/19 0749 02/16/19 0456 02/17/19 0700  NA 135 135 134* 133* 133* 134* 135 133*  K 4.0 4.4 3.3* 4.2 5.1 3.7 4.4 3.9  CL 98 99 98 96* 97* 96* 95* 94*  CO2 21* 21* 23 19* 15* 22 14* 9*  GLUCOSE 78 81 108* 74 42* 85 50* 152*  BUN 74* 76* 41* 47* 54* 28* 34* 39*  CREATININE 3.58* 4.26* 3.21* 3.90* 4.56* 2.99* 3.82* 4.46*  CALCIUM 8.5* 8.7* 8.1* 8.6* 8.5* 8.4* 8.6* 8.2*  MG 2.2 2.4 2.0 2.2  --   --   --   --   PHOS 5.5* 6.4*  --  6.1*  --   --   --   --    GFR: Estimated Creatinine Clearance: 11.3 mL/min (A) (by C-G formula based on SCr of 4.46 mg/dL (H)). Liver Function Tests: Recent Labs  Lab 02/12/19 1330 02/13/19 0334 02/14/19 0336 02/14/19 2126 02/17/19 0700  AST 152* 153*  --  278* 1,184*  ALT <5 5  --  5 29  ALKPHOS 67 61  --  70 84  BILITOT 2.2* 1.7*  --  2.6* 2.9*  PROT 9.1* 8.8*  --  9.9* 9.3*  ALBUMIN 2.2* 2.0* 2.1* 2.2* 2.1*   No results for input(s): LIPASE, AMYLASE in the last 168 hours. Recent Labs  Lab 02/16/19 1622 02/17/19 0700  AMMONIA 73* 84*   Coagulation Profile: Recent Labs  Lab 02/14/19 0336 02/14/19 2126 02/15/19 0749 02/16/19 0456 02/17/19 0700  INR 2.8* 3.6* 3.9* 4.6* 7.7*   Cardiac Enzymes: No results for input(s): CKTOTAL, CKMB, CKMBINDEX, TROPONINI in the last 168 hours. BNP (last 3 results) No results for input(s): PROBNP in the last 8760 hours. HbA1C: No results for input(s): HGBA1C in the last 72 hours. CBG: Recent Labs  Lab 02/16/19 2000 02/17/19 0105 02/17/19 0526 02/17/19 0757 02/17/19 1118  GLUCAP 117* 113* 125* 127* 131*   Lipid Profile: No results for input(s): CHOL, HDL, LDLCALC, TRIG, CHOLHDL, LDLDIRECT in the last 72 hours. Thyroid Function Tests: No results for input(s): TSH, T4TOTAL, FREET4, T3FREE,  THYROIDAB in the last 72 hours. Anemia Panel: No results for input(s): VITAMINB12, FOLATE, FERRITIN, TIBC, IRON, RETICCTPCT in the last 72 hours. Sepsis Labs: No results for input(s): PROCALCITON, LATICACIDVEN in the last 168 hours.  No results  found for this or any previous visit (from the past 240 hour(s)).   Radiology Studies: Dg Abd 1 View  Result Date: 02/16/2019 CLINICAL DATA:  Abdominal pain. EXAM: ABDOMEN - 1 VIEW COMPARISON:  Radiograph of March 19, 2016. FINDINGS: The bowel gas pattern is normal. Residual contrast is noted in the descending colon and rectum. No radio-opaque calculi or other significant radiographic abnormality are seen. IMPRESSION: No evidence of bowel obstruction or ileus. Electronically Signed   By: Marijo Conception M.D.   On: 02/16/2019 11:55   US Abdomen Limited Ruq  Result Date: 02/17/2019 CLINICAL DATA:  Cirrhosis and shortness of breath for 2 weeks. History of appendectomy. EXAM: ULTRASOUND ABDOMEN LIMITED RIGHT UPPER QUADRANT COMPARISON:  CT abdomen pelvis 02/05/2019 FINDINGS: Gallbladder: The gallbladder contains a moderate amount of sludge and multiple tiny stones. No gallbladder wall thickening. Common bile duct: Diameter: 0.4 cm Liver: No focal lesion identified. Heterogeneous echotexture of the liver with mildly nodular contour. Portal vein is patent on color Doppler imaging with normal direction of blood flow towards the liver. Other: Moderate right upper quadrant ascites. IMPRESSION: 1. Moderate gallbladder sludge and tiny gallstones. No definite wall thickening. 2. Mildly nodular contour of the liver with heterogeneous echotexture in keeping with history of cirrhosis. 3.  Moderate right upper quadrant ascites. Electronically Signed   By: Audie Pinto M.D.   On: 02/17/2019 12:31    Scheduled Meds: . Chlorhexidine Gluconate Cloth  6 each Topical Q0600  . Chlorhexidine Gluconate Cloth  6 each Topical Q0600  . Chlorhexidine Gluconate Cloth  6 each  Topical Q0600  . [START ON 02/22/2019] darbepoetin (ARANESP) injection - DIALYSIS  60 mcg Subcutaneous Q7 days  . feeding supplement  1 Container Oral TID WC  . feeding supplement (ENSURE ENLIVE)  237 mL Oral BID BM  . lactulose  10 g Oral TID  . metoprolol tartrate  2.5 mg Intravenous Q6H  . pantoprazole  40 mg Oral Daily  . ursodiol  300 mg Oral BID  . Warfarin - Pharmacist Dosing Inpatient   Does not apply q1800   Continuous Infusions: . albumin human       LOS: 24 days   Marylu Lund, MD [facility] Pager On Amion  If 7PM-7AM, please contact night-coverage 02/17/2019, 2:33 PM

## 2019-02-17 NOTE — Progress Notes (Addendum)
ANTICOAGULATION CONSULT NOTE  Pharmacy Consult for warfarin + heparin Indication: atrial fibrillation and mechanical MVR  Vital Signs: Temp: 97.5 F (36.4 C) (10/10 0800) Temp Source: Axillary (10/10 0800) BP: 112/49 (10/10 0800) Pulse Rate: 76 (10/10 0800)  Labs: Recent Labs    02/14/19 2126 02/15/19 0749 02/15/19 0919 02/15/19 1600 02/16/19 0456 02/17/19 0700  HGB 8.3*  --  8.0* 8.0*  --  8.0*  HCT 25.6*  --  24.0* 24.3*  --  26.1*  PLT 174  --   --   --   --  104*  LABPROT 35.0* 37.3*  --   --  42.4* 63.6*  INR 3.6* 3.9*  --   --  4.6* 7.7*  CREATININE 4.56* 2.99*  --   --  3.82* 4.46*    Estimated Creatinine Clearance: 11.3 mL/min (A) (by C-G formula based on SCr of 4.46 mg/dL (H)).  Assessment: 68 y.o. female admitted with CHF/SOB, h/o Afib and mechanical MVR, to continue warfarin. PTA regimen is 4 mg daily, noted prior goal 2-3 has been adjusted to 2.5-3.5 this admit by cardiology. INR has been elevated likely due to vitamin K deficiency. Vitamin K given 9/22 and again 9/29 and 9/30. LFTs more elevated today with AST > 1,000.   INR this morning remains above goal at 7.7. CBC stable from yesterday, platelets decreased from when last checked on 10/7. RN notes that patient had bloody mucus in her stool this morning, but nothing significant otherwise. Will continue to hold warfarin.  **PTA warfarin dose = 4mg  daily  Goal of Therapy:  INR 2.5-3.5 Monitor platelets by anticoagulation protocol: Yes    Plan:  -Hold warfarin tonight  -Daily INR, CBC -Watch S/Sx bleeding closely   Richardine Service, PharmD PGY1 Pharmacy Resident Phone: [phone] 02/17/2019  8:39 AM  Please check [email or web address] for unit-specific pharmacy phone numbers.

## 2019-02-17 NOTE — Progress Notes (Signed)
Patient ID: Marie Malone, female   DOB: [birth year], 69 y.o.   MRN: [id number] [facility] Progress Note   Assessment/ Plan:   1.  Acute kidney injury:  remains oliguric.  Differential diagnosis include ATN and AIN with available labs favoring hemodynamically mediated AKI/ATN.  Renal ultrasound negative for any obstruction   Previous urine studies indicate above decreased effective arterial blood volume (likely suggestive of CRS) with renal hypoperfusion injury and worsening renal function with diuretics.  Started on dialysis on 10/2 via nontunneled catheter - had on 10/2, 10/5 and overnight between 107 and 10/8 - baseline Cr mid to low 1's - was 1.66 on 9/26 - Next HD planned  for today- mostly for volume - BUN is only 39 so dont suspect is causing this decreased LOC  - Would avoid hypotension; noted metoprolol is for atypical a flutter  - reassess tunneled catheter placement after team adjusts medications; they are also managing hypoglycemia.  Less communicative today or yesterday.   2.  Hypoxic respiratory failure: Secondary to acute exacerbation of diastolic heart failure in this patient with pulmonary hypertension.  Optimize volume with HD as tolerated -  Getting IVF for sugar-  Is already volume overloaded    3.  Anemia: secondary to chronic disease and recent acute loss.  iron is replete.  aranesp given 10/8.   5.  History of mechanical mitral valve: On anticoagulation per primary team. Hx supratherapeutic INR   6.  History of primary biliary cirrhosis   7. Atypical flutter   - on metoprolol per cardiology   8. Acute on chronic combined CHF  - optimize volume status with HD as able   Subjective:   MS not improved, just moaning.  Primary found high ammonia- ordered lactulose.  Sugar still low at times but d10 stopped due to volume overload-   WBC and LFts way up - GI called Due for HD today    Objective:   BP (!) 112/49 (BP Location: Right Arm)   Pulse 76   Temp  (!) 97.5 F (36.4 C) (Axillary)   Resp (!) 23   Ht 5\' 2"  (1.575 m)   Wt 71 kg   SpO2 91%   BMI 28.63 kg/m   Intake/Output Summary (Last 24 hours) at 02/17/2019 1006 Last data filed at 02/17/2019 0819 Gross per 24 hour  Intake 50 ml  Output -  Net 50 ml   Weight change: 1 kg  Physical Exam:  Gen: adult female in bed not communicative -does not answer questions CVS: Pulse regular rhythm, normal rate, normal S1 and S2 Resp: clear anteriorly and unlabored  Abd: Soft, slightly distended, bowel sounds normal Ext: 1-2+ edema lower extremities  Access: RIJ nontunneled catheter placed 10/2  Imaging: Dg Abd 1 View  Result Date: 02/16/2019 CLINICAL DATA:  Abdominal pain. EXAM: ABDOMEN - 1 VIEW COMPARISON:  Radiograph of March 19, 2016. FINDINGS: The bowel gas pattern is normal. Residual contrast is noted in the descending colon and rectum. No radio-opaque calculi or other significant radiographic abnormality are seen. IMPRESSION: No evidence of bowel obstruction or ileus. Electronically Signed   By: Marijo Conception M.D.   On: 02/16/2019 11:55    Labs: BMET Recent Labs  Lab 02/11/19 0556 02/12/19 6294 02/13/19 0334 02/14/19 0336 02/14/19 2126 02/15/19 0749 02/16/19 0456 02/17/19 0700  NA 135 135 134* 133* 133* 134* 135 133*  K 4.0 4.4 3.3* 4.2 5.1 3.7 4.4 3.9  CL 98 99 98 96* 97* 96*  95* 94*  CO2 21* 21* 23 19* 15* 22 14* 9*  GLUCOSE 78 81 108* 74 42* 85 50* 152*  BUN 74* 76* 41* 47* 54* 28* 34* 39*  CREATININE 3.58* 4.26* 3.21* 3.90* 4.56* 2.99* 3.82* 4.46*  CALCIUM 8.5* 8.7* 8.1* 8.6* 8.5* 8.4* 8.6* 8.2*  PHOS 5.5* 6.4*  --  6.1*  --   --   --   --    CBC Recent Labs  Lab 02/13/19 0334  02/14/19 0336  02/14/19 2126 02/15/19 0919 02/15/19 1600 02/17/19 0700  WBC 8.0  --  8.8  --  9.7  --   --  18.2*  HGB 7.5*   < > 8.3*   < > 8.3* 8.0* 8.0* 8.0*  HCT 23.0*   < > 25.6*   < > 25.6* 24.0* 24.3* 26.1*  MCV 88.5  --  88.6  --  89.5  --   --  94.9  PLT 154  --   170  --  174  --   --  104*   < > = values in this interval not displayed.    Medications:    . Chlorhexidine Gluconate Cloth  6 each Topical Q0600  . Chlorhexidine Gluconate Cloth  6 each Topical Q0600  . Chlorhexidine Gluconate Cloth  6 each Topical Q0600  . [START ON 02/22/2019] darbepoetin (ARANESP) injection - DIALYSIS  60 mcg Subcutaneous Q7 days  . feeding supplement  1 Container Oral TID WC  . feeding supplement (ENSURE ENLIVE)  237 mL Oral BID BM  . lactulose  10 g Oral TID  . metoprolol succinate  12.5 mg Oral Daily  . pantoprazole  40 mg Oral Daily  . ursodiol  300 mg Oral BID  . Warfarin - Pharmacist Dosing Inpatient   Does not apply [facility]  02/17/2019, 10:06 AM

## 2019-02-17 NOTE — Progress Notes (Addendum)
[facility] Progress Note  CC:  PBC, elevated LFT's  Subjective:  Patient seen by GI on this admission for rectal bleeding.  Now being called back for evaluation regarding increasing LFT's ? related to her diagnosis of PBC.  Been on Ursodiol 500 mg BID since July 2019.  Was last seen for this issue on 07/26/2018 by Dr. Henrene Pastor at which time she was doing well and labs were about normal/stable.  ALP and ALT have been normal.  AST was 278 three days ago and now up to 1184 today.  Total bili stable at 2.9 compared to 2.6 three days ago.  Patient seen in dialysis.  She is just moaning, will not converse.  Objective:  Vital signs in last 24 hours: Temp:  [97.3 F (36.3 C)-98.2 F (36.8 C)] 98.2 F (36.8 C) (10/10 1121) Pulse Rate:  [76-92] 91 (10/10 1121) Resp:  [16-27] 23 (10/10 1121) BP: (100-135)/(49-82) 121/50 (10/10 1121) SpO2:  [91 %-100 %] 93 % (10/10 1121) Weight:  [71 kg] 71 kg (10/10 0500) Last BM Date: 02/17/19 General:  Alert, moaning Heart:  Regular rate and rhythm; no murmurs but valve click noted Pulm:  CTAB anteriorly Abdomen:  Soft, non-distended.  BS present.  Non-tender.  Patient continuously moaning, but does not express any further discomfort upon palpation of her abdomen.  Intake/Output this shift: Total I/O In: 50 [P.O.:50] Out: -   Lab Results: Recent Labs    02/14/19 2126 02/15/19 0919 02/15/19 1600 02/17/19 0700  WBC 9.7  --   --  18.2*  HGB 8.3* 8.0* 8.0* 8.0*  HCT 25.6* 24.0* 24.3* 26.1*  PLT 174  --   --  104*   BMET Recent Labs    02/15/19 0749 02/16/19 0456 02/17/19 0700  NA 134* 135 133*  K 3.7 4.4 3.9  CL 96* 95* 94*  CO2 22 14* 9*  GLUCOSE 85 50* 152*  BUN 28* 34* 39*  CREATININE 2.99* 3.82* 4.46*  CALCIUM 8.4* 8.6* 8.2*   LFT Recent Labs    02/17/19 0700  PROT 9.3*  ALBUMIN 2.1*  AST 1,184*  ALT 29  ALKPHOS 84  BILITOT 2.9*   PT/INR Recent Labs    02/16/19 0456 02/17/19 0700  LABPROT 42.4*  63.6*  INR 4.6* 7.7*   Dg Abd 1 View  Result Date: 02/16/2019 CLINICAL DATA:  Abdominal pain. EXAM: ABDOMEN - 1 VIEW COMPARISON:  Radiograph of March 19, 2016. FINDINGS: The bowel gas pattern is normal. Residual contrast is noted in the descending colon and rectum. No radio-opaque calculi or other significant radiographic abnormality are seen. IMPRESSION: No evidence of bowel obstruction or ileus. Electronically Signed   By: Marijo Conception M.D.   On: 02/16/2019 11:55   US Abdomen Limited Ruq  Result Date: 02/17/2019 CLINICAL DATA:  Cirrhosis and shortness of breath for 2 weeks. History of appendectomy. EXAM: ULTRASOUND ABDOMEN LIMITED RIGHT UPPER QUADRANT COMPARISON:  CT abdomen pelvis 02/05/2019 FINDINGS: Gallbladder: The gallbladder contains a moderate amount of sludge and multiple tiny stones. No gallbladder wall thickening. Common bile duct: Diameter: 0.4 cm Liver: No focal lesion identified. Heterogeneous echotexture of the liver with mildly nodular contour. Portal vein is patent on color Doppler imaging with normal direction of blood flow towards the liver. Other: Moderate right upper quadrant ascites. IMPRESSION: 1. Moderate gallbladder sludge and tiny gallstones. No definite wall thickening. 2. Mildly nodular contour of the liver with heterogeneous echotexture in keeping with history of cirrhosis. 3.  Moderate right  upper quadrant ascites. Electronically Signed   By: Audie Pinto M.D.   On: 02/17/2019 12:31   Assessment / Plan: #37 68 year old African-American female with hypoxic respiratory failure, secondary to acute exacerbation of CHF, history of pulmonary hypertension  #2 acute kidney injury-now with  uremia-started hemodialysis 02/09/2019, anticipating will need long-term dialysis  #3 left lower extremity cellulitis-completed antibiotics  #4 history of mechanical mitral valve replacement-on Coumadin with supratherapeutic INR again today at 7.7  #5 primary biliary  cirrhosis:  Had been stable on ursodiol.  AST quadrupled now from 3 days ago and is around 1100.  Suspect that is more from an acute insult, maybe from hypotension and fluid being pulled off at HD, more of a shock liver scenario, rather than related to her PBC.  She is certainly not a candidate for liver biopsy, etc at this point.  #6 hematochezia in setting of INR greater than 5, bleeding resolved.  -Continue to monitor/trend LFT's. -Continue lactulose for elevated ammonia level and likely some HE related to that. -Continue ursodiol BID. -Agree with palliative care consult that has been placed by hospitalist.    LOS: 24 days   Marie Malone  02/17/2019, 1:51 PM    Attending physician's note   I have taken an interval history, reviewed the chart and examined the patient. I agree with the Advanced Practitioner's note, impression and recommendations.   Pt known to GI service. Reconsult.   Abn AST ? Etiology. Doubt liver as etiology. Could be from muscle origin given Nl ALT.   PBC stable on URSO (Nl Alk phos, Nl ALT, stable TB), neg [place] today-showing stable liver cirrhosis, mod ascites.  CHF exacerbation, MV replacement on Coumadin (INR supratherapeutic)PAH, AKI on CKD started HD 10/2. LLL cellulitis.  Plan: - ? Check CPK/myoglobin. - No GI intervention.  Not a candidate for any liver biopsy/paracentesis. - Continue supportive treatment - Continue Urso. - Trend CMP.    Carmell Austria, MD [facility] [phone].

## 2019-02-17 NOTE — Progress Notes (Addendum)
Hypoglycemic Event  CBG: 57  Treatment: D50 50 mL (25 gm)  Symptoms: None  Follow-up CBG: Time:2346 CBG Result: 115 Possible Reasons for Event: Inadequate meal intake  Comments/MD notified:    Monzerrath Mcburney, Mosetta Pigeon

## 2019-02-17 NOTE — Progress Notes (Signed)
Pt noted to be in acute distress. Appears to be in pain, O2 sats in 80's on room air. 2L O2 placed by nasal cannula. Notified Dr. Wyline Copas of critical INR. Ammonia noted to be elevated. Rapid response notified.

## 2019-02-17 NOTE — Significant Event (Addendum)
Rapid Response Event Note  Overview:Called d/t increasing oxygen demand. Pt on 3L earlier and is now on 15L HFNC with SpO2-86%. Pt was made a LCB(bipap and meds only) today. Time Called: 2100 Arrival Time: 2108 Event Type: Neurologic, Respiratory  Initial Focused Assessment: Pt laying in bed, moaning. Pt will not speak or follow commands, but will localize and respond to painful stimulation in all extremities. Pt respirations are very shallow.  HR-94, BP-95/52, RR-17, SpO2-86% on 15L HFNC. Lungs clear and diminished. Skin cool to touch. Pt placed on NRB with SpO2 increasing to 95%.  CBG-59 previously and txed with 25cc D50. Follow-up cbg was 74 and D10W gtt ordered.   Interventions: D10W @ 10cc/hr NRB Plan of Care (if not transferred): Pt is currently not a candidate for bipap based on mental status and risk for aspiration. At this time, pt requiring NRB to maintain SpO2. If SpO2 allows, may attempt to wean off NRB. D10 gtt for hypoglycemia. Call RRT if further assistance needed. Event Summary: Name of Physician Notified: Blount, NP at 2104    at    Outcome: Stayed in room and stabalized  Event End Time: 2130    Update: 2350-SpO2-70s on NRB, Blount to bedside, family updated, pt made DNR/comfort care.  Dillard Essex

## 2019-02-17 NOTE — Progress Notes (Addendum)
CBG 59 prior to shift with treatment per protocol by prior RN, Repeat CBG 72.  Pt sats 3L [place] 80s, increased to 6L humidified with no improvement, trialed HFNC up to 15L with no improvement. Lungs diminished t/o ant.  RR 14-19.  Pt unable to follow commands at change of shift and arousable to painful stimuli, Bil eyes PERRLA.  [facility] paged with new orders.  RRT also notified.  Sats currently 90% on NRB.  Pt currently opening eyes and moaning, cont to not follow commands and moaning.  BP 112/61, HR 93.  Will cont to monitor closely.

## 2019-02-17 NOTE — Progress Notes (Signed)
CRITICAL VALUE ALERT  Critical Value:  INR 7.7  Date & Time Notied: 10/10 0736  Provider Notified: Dr. Wyline Copas  Orders Received/Actions taken: fluids D/C'd. Abd [place] ordered. Coumadin held

## 2019-02-18 DIAGNOSIS — Z515 Encounter for palliative care: Secondary | ICD-10-CM

## 2019-02-18 DIAGNOSIS — N170 Acute kidney failure with tubular necrosis: Secondary | ICD-10-CM

## 2019-02-18 MED ORDER — SODIUM CHLORIDE 0.9 % IV SOLN
0.5000 mg/h | INTRAVENOUS | Status: DC
  Administered 2019-02-18: 0.25 mg/h via INTRAVENOUS
  Filled 2019-02-18: qty 5

## 2019-02-18 MED ORDER — GLYCOPYRROLATE 0.2 MG/ML IJ SOLN: 0.2000 mg | INTRAMUSCULAR | Status: DC | PRN

## 2019-02-18 MED ORDER — HALOPERIDOL LACTATE 5 MG/ML IJ SOLN: 0.5000 mg | INTRAMUSCULAR | Status: DC | PRN

## 2019-02-18 MED ORDER — GLYCOPYRROLATE 1 MG PO TABS: 1.0000 mg | ORAL_TABLET | ORAL | Status: DC | PRN

## 2019-02-18 MED ORDER — MORPHINE SULFATE (PF) 2 MG/ML IV SOLN
2.0000 mg | INTRAVENOUS | Status: DC | PRN
  Administered 2019-02-18: 2 mg via INTRAVENOUS
  Filled 2019-02-18: qty 1

## 2019-02-18 MED ORDER — HALOPERIDOL LACTATE 2 MG/ML PO CONC: 0.5000 mg | ORAL | Status: DC | PRN

## 2019-02-18 MED ORDER — MORPHINE 100MG IN NS 100ML (1MG/ML) PREMIX INFUSION: 1.0000 mg/h | INTRAVENOUS | Status: DC

## 2019-02-18 MED ORDER — HYDROMORPHONE BOLUS VIA INFUSION
0.2000 mg | INTRAVENOUS | Status: DC | PRN
  Filled 2019-02-18: qty 1

## 2019-02-18 MED ORDER — POLYVINYL ALCOHOL 1.4 % OP SOLN: 1.0000 [drp] | Freq: Four times a day (QID) | OPHTHALMIC | Status: DC | PRN

## 2019-02-18 MED ORDER — BIOTENE DRY MOUTH MT LIQD: 15.0000 mL | OROMUCOSAL | Status: DC | PRN

## 2019-02-18 MED ORDER — ACETAMINOPHEN 325 MG PO TABS: 650.0000 mg | ORAL_TABLET | Freq: Four times a day (QID) | ORAL | Status: DC | PRN

## 2019-02-18 MED ORDER — HYDROMORPHONE BOLUS VIA INFUSION
0.5000 mg | INTRAVENOUS | Status: DC | PRN
  Filled 2019-02-18: qty 1

## 2019-02-18 MED ORDER — ACETAMINOPHEN 650 MG RE SUPP: 650.0000 mg | Freq: Four times a day (QID) | RECTAL | Status: DC | PRN

## 2019-02-18 MED ORDER — MORPHINE BOLUS VIA INFUSION
2.0000 mg | INTRAVENOUS | Status: DC | PRN
  Filled 2019-02-18: qty 2

## 2019-02-18 MED ORDER — LORAZEPAM 2 MG/ML IJ SOLN: 1.0000 mg | INTRAMUSCULAR | Status: DC | PRN

## 2019-02-18 MED ORDER — HALOPERIDOL 0.5 MG PO TABS: 0.5000 mg | ORAL_TABLET | ORAL | Status: DC | PRN

## 2019-03-11 NOTE — Discharge Summary (Signed)
Death Summary  Marie Malone FHQ:[id number] DOB: 1950/11/22 DOA: 24-Jan-2019  PCP: Debbrah Alar, NP  Admit date: 01-24-2019 Date of Death: 19-Feb-2019 Time of Death: 0924 Notification: Debbrah Alar, NP notified of death of 02/19/19   History of present illness:  Please see admit H and P for details. 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   While being diuresed, patients's renal function worsened and Nephrology and Cardiology consulted. Patient was also found to have LLE cellulitis that was treated with antibiotics. Course was complicated by labile INR with bouts of supertherapautic INR levels with hematochezia, prompting GI consultation with recommendation for conservative management given patient's tenuous course. Patient's renal function continued to decline with patient displaying uremic symptoms and patient was initiated on HD, which she initially tolerated well. Later, patient noted to have periods of profound hypoglycemic spells requiring hypoglycemic protocol. Patient's mentation gradually worsened and the patient became less communicative. LFT's, specifically AST and ammonia levels, rose and pt's INR became supertheraputic with peak INR of 7.7. Ammonia levels continued to rise despite scheduled lactulose and multiple bowel movements. There were concerns of worsening liver function, thus GI was re-consulted with recommendations for continued conservative measures and Palliative Care referral. Palliative Care was consulted. Overnight, patient's condition deteriorated rapidly and patient's wishes were noted to be made comfort care only by family. On 19-Feb-2019 at 0924, patient was found to have no spontaneous respiration,  pulse, or heart sounds auscultated and she was subsequently pronounced.  Final Diagnoses:   LLE cellulitis Anasarca Acute renal failure Hyponatremia Hyperkalemia Acute respiratory failure with hypoxia Acute on chronic systolic CHF Mechanical mitral valve Atrial fibrillation chronic Primary biliary cirrhosis  Acute blood loss Anemia Toxic metabolic encephalopathy Hypoglycemia  The results of significant diagnostics from this hospitalization (including imaging, microbiology, ancillary and laboratory) are listed below for reference.    Significant Diagnostic Studies: Ct Abdomen Pelvis Wo Contrast  Result Date: 02/05/2019 CLINICAL DATA:  Lower extremity edema, history of primary biliary cholangitis EXAM: CT ABDOMEN AND PELVIS WITHOUT CONTRAST TECHNIQUE: Multidetector CT imaging of the abdomen and pelvis was performed following the standard protocol without IV contrast. COMPARISON:  CT 08/09/2017 FINDINGS: Lower chest: Small bilateral effusions. Central vascular crowding is noted. Punctate granuloma noted right lung base. There is a prior mitral valve replacement and atrial occlusion device noted over the cardiac silhouette. There is extensive severe coronary artery calcification as well as dense coronary calcification of the aortic root. Post CABG changes are noted as well with multiple sternal wires which appear intact. Hepatobiliary: Slightly nodular opacity surface contour. No visible or contour deforming hepatic lesions. No frank gallbladder wall thickening. No visible gallstones. Slight prominence of the biliary tree is nonspecific given patient age. Pancreas: Unremarkable. No pancreatic ductal dilatation or surrounding inflammatory changes. Spleen: Normal in size without focal abnormality. Adrenals/Urinary Tract: Adrenal glands are unremarkable. Kidneys are normal, without renal calculi, visible renal lesion, or hydronephrosis. Bladder is unremarkable. Stomach/Bowel: Ingested contrast  traverses to the level of the transverse colon. Distal esophagus, stomach and duodenal sweep are unremarkable. No bowel wall thickening or dilatation. No evidence of obstruction. Patient chart indicates history of appendectomy. Insert colon surgical clip noted near the rectosigmoid junction. Vascular/Lymphatic: Extensive severe atherosclerotic calcification. Luminal evaluation is not possible in the absence  of contrast. Multiple a prominent though nonenlarged lymph nodes are present throughout the abdomen and groins. No pathologically enlarged nodes in the imaged chains. Reproductive: Retroverted uterus. Few calcified fibroids. No concerning adnexal lesions are visualized. Other: There is extensive severe body wall edema most pronounced over the left lateral abdominal wall and both flanks. There is a moderate volume of low-attenuation ascites in the abdomen as well. No free intraperitoneal air. Few surgical clips are seen in the anterior abdomen and low pelvis. Musculoskeletal: Body wall edema, as above. Mild muscle atrophy. Multilevel degenerative changes are present in the imaged portions of the spine. Most pronounced findings are present at L5-S1 IMPRESSION: 1. Slightly nodular opacity surface contour of the liver, suggestive of cirrhosis and compatible with history of primary biliary cholangitis. No visible or contour deforming hepatic lesions. 2. Moderate volume of low-attenuation ascites in the abdomen and pelvis, and severe body wall edema, consistent with anasarca. 3. Extensive severe atherosclerotic calcification of the aorta and its branches. Luminal evaluation is not possible in the absence of contrast. 4. Small bilateral effusions. 5. Aortic Atherosclerosis (ICD10-I70.0). 6. Prior CABG, mitral valve replacement and aortic occlusion clip placement. 7. Leiomyomatous uterus. Electronically Signed   By: Lovena Le M.D.   On: 02/05/2019 23:19   Dg Chest 2 View  Result Date: 02/01/2019 CLINICAL DATA:   Fever. EXAM: CHEST - 2 VIEW COMPARISON:  Radiographs of January 29, 2019. FINDINGS: Stable cardiomediastinal silhouette. Status post coronary artery bypass graft and cardiac valve repair. Atherosclerosis of thoracic aorta is noted. No pneumothorax or pleural effusion is noted. No acute pulmonary disease is noted. Bony thorax is unremarkable. IMPRESSION: No active cardiopulmonary disease. Aortic Atherosclerosis (ICD10-I70.0). Electronically Signed   By: Marijo Conception M.D.   On: 02/01/2019 11:44   Dg Abd 1 View  Result Date: 02/16/2019 CLINICAL DATA:  Abdominal pain. EXAM: ABDOMEN - 1 VIEW COMPARISON:  Radiograph of March 19, 2016. FINDINGS: The bowel gas pattern is normal. Residual contrast is noted in the descending colon and rectum. No radio-opaque calculi or other significant radiographic abnormality are seen. IMPRESSION: No evidence of bowel obstruction or ileus. Electronically Signed   By: Marijo Conception M.D.   On: 02/16/2019 11:55   US Renal  Result Date: 02/05/2019 CLINICAL DATA:  Acute kidney injury EXAM: RENAL / URINARY TRACT ULTRASOUND COMPLETE COMPARISON:  CT 08/09/2017 [facility] FINDINGS: Right Kidney: Renal measurements: 10.8 x 4.5 x 5.5 cm = volume: 141 mL . Echogenicity within normal limits. No mass or hydronephrosis visualized. Right renal scarring. Left Kidney: Renal measurements: 10.9 x 5.9 x 5.6 cm = volume: 190 mL. Echogenicity within normal limits. No mass or hydronephrosis visualized. Bladder: No focal abnormality.  Moderate amount of pelvic free fluid. IMPRESSION: 1. Normal renal ultrasound. 2. Moderate amount of pelvic free fluid. Electronically Signed   By: Kathreen Devoid   On: 02/05/2019 12:29   Ir Fluoro Guide Cv Line Right  Result Date: 02/09/2019 INDICATION: 68 year old female with a history of renal failure EXAM: TEMPORARY HEMODIALYSIS CATHETER WITH IMAGE GUIDANCE MEDICATIONS: None ANESTHESIA/SEDATION: None FLUOROSCOPY TIME:  Fluoroscopy Time: 0  minutes 6 seconds (1 mGy). COMPLICATIONS: None PROCEDURE: Informed written consent was obtained from the patient's family after a discussion of the risks, benefits, and alternatives to treatment. Questions regarding the procedure were encouraged and answered. The right neck was prepped with chlorhexidine in a sterile fashion, and a sterile drape was applied covering the operative field. Maximum barrier sterile technique with sterile gowns and gloves  were used for the procedure. A timeout was performed prior to the initiation of the procedure. A micropuncture kit was utilized to access the right internal jugular vein under direct, real-time ultrasound guidance after the overlying soft tissues were anesthetized with 1% lidocaine with epinephrine. Ultrasound image documentation was performed. The microwire was kinked to measure appropriate catheter length. A stiff glidewire was advanced to the level of the IVC. A 16 cm hemodialysis catheter was then placed over the wire. Final catheter positioning was confirmed and documented with a spot radiographic image. The catheter aspirates and flushes normally. The catheter was flushed with appropriate volume heparin dwells. Dressings were applied. The patient tolerated the procedure well without immediate post procedural complication. IMPRESSION: Status post image guided temporary hemodialysis catheter placement. Signed, Dulcy Fanny. Dellia Nims, RPVI [facility] Electronically Signed   By: Corrie Mckusick D.O.   On: 02/09/2019 13:34   Ir US Guide Vasc Access Right  Result Date: 02/09/2019 INDICATION: 68 year old female with a history of renal failure EXAM: TEMPORARY HEMODIALYSIS CATHETER WITH IMAGE GUIDANCE MEDICATIONS: None ANESTHESIA/SEDATION: None FLUOROSCOPY TIME:  Fluoroscopy Time: 0 minutes 6 seconds (1 mGy). COMPLICATIONS: None PROCEDURE: Informed written consent was obtained from the patient's family after a  discussion of the risks, benefits, and alternatives to treatment. Questions regarding the procedure were encouraged and answered. The right neck was prepped with chlorhexidine in a sterile fashion, and a sterile drape was applied covering the operative field. Maximum barrier sterile technique with sterile gowns and gloves were used for the procedure. A timeout was performed prior to the initiation of the procedure. A micropuncture kit was utilized to access the right internal jugular vein under direct, real-time ultrasound guidance after the overlying soft tissues were anesthetized with 1% lidocaine with epinephrine. Ultrasound image documentation was performed. The microwire was kinked to measure appropriate catheter length. A stiff glidewire was advanced to the level of the IVC. A 16 cm hemodialysis catheter was then placed over the wire. Final catheter positioning was confirmed and documented with a spot radiographic image. The catheter aspirates and flushes normally. The catheter was flushed with appropriate volume heparin dwells. Dressings were applied. The patient tolerated the procedure well without immediate post procedural complication. IMPRESSION: Status post image guided temporary hemodialysis catheter placement. Signed, Dulcy Fanny. Dellia Nims, RPVI [facility] Electronically Signed   By: Corrie Mckusick D.O.   On: 02/09/2019 13:34   Dg Chest Port 1 View  Result Date: 01/29/2019 CLINICAL DATA:  Shortness of breath. History of stroke, atrial fibrillation, hypertension. EXAM: PORTABLE CHEST 1 VIEW COMPARISON:  Chest x-rays dated 01/25/2019 and 01/12/2019. FINDINGS: Stable cardiomegaly. Probable mild atelectasis and/or small pleural effusion at the LEFT lung base. Lungs are otherwise clear. No pneumothorax seen. IMPRESSION: Probable mild atelectasis and/or small pleural effusion at the LEFT lung base. Lungs otherwise clear. Electronically Signed   By:  Franki Cabot M.D.   On: 01/29/2019 08:02   Dg Chest Port 1 View  Result Date: 01/25/2019 CLINICAL DATA:  Fevers EXAM: PORTABLE CHEST 1 VIEW COMPARISON:  01/18/2019 FINDINGS: Cardiac shadow remains enlarged. Postsurgical changes are again seen. Improvement in the vascular congestion is noted. Small right pleural effusion is again seen. No focal confluent infiltrate is noted. IMPRESSION: Improved vascular congestion. Persistent right effusion. Electronically Signed   By: Inez Catalina M.D.   On: 01/25/2019 01:34   Dg Chest Port 1 View  Result Date: 01/28/2019 CLINICAL DATA:  Shortness of  breath, patient on BiPAP EXAM: PORTABLE CHEST 1 VIEW COMPARISON:  Radiograph 07/05/2016 FINDINGS: Postsurgical changes related to prior CABG including intact and aligned sternotomy wires and multiple surgical clips projecting over the mediastinum. Aortic valve replacement is noted. Atrial occlusion clip is present. Borderline cardiomegaly. Small right pleural effusion is present. There is diffuse interstitial opacities throughout the lungs, increased from prior studies. Central peribronchovascular thickening is noted as well with cephalized, indistinct vascularity. No focal consolidative process. No pneumothorax. No acute osseous or soft tissue abnormality. IMPRESSION: Findings favor CHF/volume overload with mild cardiomegaly, interstitial pulmonary edema with small right pleural effusion. Electronically Signed   By: Lovena Le M.D.   On: 01/22/2019 22:22   Ct Extremity Lower Left Wo Contrast  Result Date: 01/30/2019 CLINICAL DATA:  Fullness and pain, compartment syndrome, supratherapeutic INR, rule out hemorrhage EXAM: CT OF THE LOWER LEFT EXTREMITY WITHOUT CONTRAST TECHNIQUE: Multidetector CT imaging of the lower left extremity was performed according to the standard protocol. COMPARISON:  07/17/2013 CT pelvis FINDINGS: Bones/Joint/Cartilage Hip and proximal femur not included. Mild tricompartmental degenerative  spurring in the knee with small low-attenuation effusion. No fracture, dislocation, or worrisome bone lesions. The mid and forefoot are not included. Ligaments Suboptimally assessed by CT. Muscles and Tendons No intramuscular hematoma or mass. Soft tissues There is marked skin thickening and subcutaneous edema. No discrete hematoma or other fluid collection. Scattered vascular calcifications in popliteal and tibial vessels. IMPRESSION: 1. Negative for hematoma or other acute finding. 2. 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 Electronically Signed   By: Lucrezia Europe M.D.   On: 01/30/2019 18:35   Vas [place] Lower Extremity Venous (dvt)  Result Date: 01/30/2019  Lower Venous Study Indications: Pain, and Swelling, interstitial edema.  Limitations: Poor ultrasound/tissue interface and patient's pain with touch. Comparison Study: No prior study on file for comparison Performing Technologist: Sharion Dove RVS  Examination Guidelines: A complete evaluation includes B-mode imaging, spectral Doppler, color Doppler, and power Doppler as needed of all accessible portions of each vessel. Bilateral testing is considered an integral part of a complete examination. Limited examinations for reoccurring indications may be performed as noted.  +---------+---------------+---------+-----------+----------+--------------+  RIGHT     Compressibility Phasicity Spontaneity Properties Thrombus Aging  +---------+---------------+---------+-----------+----------+--------------+  CFV       Full                                             Pulsatile flow  +---------+---------------+---------+-----------+----------+--------------+  SFJ       Full                                                             +---------+---------------+---------+-----------+----------+--------------+  FV Prox   Full                                                             +---------+---------------+---------+-----------+----------+--------------+  FV  Mid    Full                                                             +---------+---------------+---------+-----------+----------+--------------+  FV Distal Full                                                             +---------+---------------+---------+-----------+----------+--------------+  PFV       Full                                                             +---------+---------------+---------+-----------+----------+--------------+  POP       Full                                             Pulsatile flow  +---------+---------------+---------+-----------+----------+--------------+  PTV       Full                                                             +---------+---------------+---------+-----------+----------+--------------+  PERO      Full                                                             +---------+---------------+---------+-----------+----------+--------------+   +---------+---------------+---------+-----------+----------+--------------+  LEFT      Compressibility Phasicity Spontaneity Properties Thrombus Aging  +---------+---------------+---------+-----------+----------+--------------+  CFV       Full                                             Pulsatile flow  +---------+---------------+---------+-----------+----------+--------------+  SFJ       Full                                                             +---------+---------------+---------+-----------+----------+--------------+  FV Prox   Full                                                             +---------+---------------+---------+-----------+----------+--------------+  FV Mid    Full                                                             +---------+---------------+---------+-----------+----------+--------------+  FV Distal Full                                                             +---------+---------------+---------+-----------+----------+--------------+  PFV       Full                                                              +---------+---------------+---------+-----------+----------+--------------+  POP       Full                                             Pulsatile flow  +---------+---------------+---------+-----------+----------+--------------+  PTV       Full                                                             +---------+---------------+---------+-----------+----------+--------------+  PERO      Full                                                             +---------+---------------+---------+-----------+----------+--------------+     Summary: Right: There is no evidence of deep vein thrombosis in the lower extremity. However, portions of this examination were limited- see technologist comments above. Left: There is no evidence of deep vein thrombosis in the lower extremity. However, portions of this examination were limited- see technologist comments above. Significant fluid noted in the posterior calf, etiology unknown.  *See table(s) above for measurements and observations. Electronically signed by Deitra Mayo MD on 01/30/2019 at 9:32:51 AM.    Final    US Abdomen Limited Ruq  Result Date: 02/17/2019 CLINICAL DATA:  Cirrhosis and shortness of breath for 2 weeks. History of appendectomy. EXAM: ULTRASOUND ABDOMEN LIMITED RIGHT UPPER QUADRANT COMPARISON:  CT abdomen pelvis 02/05/2019 FINDINGS: Gallbladder: The gallbladder contains a moderate amount of sludge and multiple tiny stones. No gallbladder wall thickening. Common bile duct: Diameter: 0.4 cm Liver: No focal lesion identified. Heterogeneous echotexture of the liver with mildly nodular contour. Portal vein is patent on color Doppler imaging with normal direction of blood flow towards the liver. Other: Moderate right upper quadrant ascites. IMPRESSION: 1. Moderate gallbladder sludge and tiny gallstones. No definite wall thickening. 2. Mildly nodular contour of the liver with heterogeneous echotexture in keeping with history of  cirrhosis. 3.  Moderate right upper quadrant ascites. Electronically Signed   By: Audie Pinto M.D.   On: 02/17/2019 12:31    Microbiology: No results found for this or any previous visit (from the past 240 hour(s)).   Labs: Basic Metabolic Panel: Recent Labs  Lab 02/12/19 [id number] 02/13/19 0334 02/14/19 0336 02/14/19  2126 02/15/19 0749 02/16/19 0456 02/17/19 0700  NA 135 134* 133* 133* 134* 135 133*  K 4.4 3.3* 4.2 5.1 3.7 4.4 3.9  CL 99 98 96* 97* 96* 95* 94*  CO2 21* 23 19* 15* 22 14* 9*  GLUCOSE 81 108* 74 42* 85 50* 152*  BUN 76* 41* 47* 54* 28* 34* 39*  CREATININE 4.26* 3.21* 3.90* 4.56* 2.99* 3.82* 4.46*  CALCIUM 8.7* 8.1* 8.6* 8.5* 8.4* 8.6* 8.2*  MG 2.4 2.0 2.2  --   --   --   --   PHOS 6.4*  --  6.1*  --   --   --   --    Liver Function Tests: Recent Labs  Lab 02/12/19 1330 02/13/19 0334 02/14/19 0336 02/14/19 2126 02/17/19 0700  AST 152* 153*  --  278* 1,184*  ALT <5 5  --  5 29  ALKPHOS 67 61  --  70 84  BILITOT 2.2* 1.7*  --  2.6* 2.9*  PROT 9.1* 8.8*  --  9.9* 9.3*  ALBUMIN 2.2* 2.0* 2.1* 2.2* 2.1*   No results for input(s): LIPASE, AMYLASE in the last 168 hours. Recent Labs  Lab 02/16/19 1622 02/17/19 0700  AMMONIA 73* 84*   CBC: Recent Labs  Lab 02/12/19 0605  02/13/19 0334  02/14/19 0336  02/14/19 1500 02/14/19 2126 02/15/19 0919 02/15/19 1600 02/17/19 0700  WBC 9.2  --  8.0  --  8.8  --   --  9.7  --   --  18.2*  HGB 8.2*   < > 7.5*   < > 8.3*   < > 8.3* 8.3* 8.0* 8.0* 8.0*  HCT 24.4*   < > 23.0*   < > 25.6*   < > 25.1* 25.6* 24.0* 24.3* 26.1*  MCV 86.2  --  88.5  --  88.6  --   --  89.5  --   --  94.9  PLT 209  --  154  --  170  --   --  174  --   --  104*   < > = values in this interval not displayed.   Cardiac Enzymes: No results for input(s): CKTOTAL, CKMB, CKMBINDEX, TROPONINI in the last 168 hours. D-Dimer No results for input(s): DDIMER in the last 72 hours. BNP: Invalid input(s): POCBNP CBG: Recent Labs  Lab  02/17/19 1118 02/17/19 1900 02/17/19 2007 02/17/19 2226 02/17/19 2346  GLUCAP 131* 59* 74 57* 115*   Anemia work up No results for input(s): VITAMINB12, FOLATE, FERRITIN, TIBC, IRON, RETICCTPCT in the last 72 hours. Urinalysis    Component Value Date/Time   COLORURINE AMBER (A) 02/04/2019 1922   APPEARANCEUR CLOUDY (A) 02/04/2019 1922   LABSPEC 1.033 (H) 02/04/2019 1922   PHURINE 5.0 02/04/2019 1922   GLUCOSEU NEGATIVE 02/04/2019 1922   GLUCOSEU NEGATIVE 05/15/2018 [place] 02/04/2019 1922   BILIRUBINUR NEGATIVE 02/04/2019 1922   BILIRUBINUR NEG 11/07/2017 1013   KETONESUR 5 (A) 02/04/2019 1922   PROTEINUR 30 (A) 02/04/2019 1922   UROBILINOGEN 1.0 05/15/2018 1525   NITRITE NEGATIVE 02/04/2019 1922   LEUKOCYTESUR NEGATIVE 02/04/2019 1922   Sepsis Labs Invalid input(s): PROCALCITONIN,  WBC,  LACTICIDVEN    SIGNED:  Marylu Lund, MD  [facility] 02/21/2019, 9:38 AM  If 7PM-7AM, please contact night-coverage [email or web address] Password [email or web address]

## 2019-03-11 NOTE — Progress Notes (Signed)
Called by bedside RN at approximately 2130 with concerns of worsening hypoxemia and vaginal bleeding. According to RN pt is obtunded and oxygen levels have remained in the 80's despite increasing oxygen and placing on venti-mask. Pt was made a partial code at shift change after speaking with daughter about decline in mentation and liver function. Received additional phone call by RN @0010  because pt is continuing to deteriorate despite appropriate measures taken. Call made to pt's sister and daughter who have decided to make pt a full DNR and continue with comfort measure only. Will order morphine IV as needed for pain and comfort. Family to be at bedside tonight.  Lovey Newcomer, NP [facility] 7p-7a [phone]

## 2019-03-11 NOTE — Progress Notes (Signed)
Patient expired at 0924hrs, Dr. Wyline Copas here and pronounced.   Family at bedside.  All belongings sent with family.  CDS called and patient potential eye and tissue donor. Grandchildren just arrived and at bedside.  Will complete post mortem care and eye care once family has left.

## 2019-03-11 NOTE — Progress Notes (Signed)
Daytime chaplain received referral from night-time Chaplain Kidd.   Patient had just died when chaplain entered room.  Daughter and sister bedside, still in shock and weeping. Chaplain offer words of comfort, a Psalm, and prayer.   Daughter thanked Clinical biochemist for coming. Tamsen Snider Pager [phone]

## 2019-03-11 NOTE — Consult Note (Signed)
Consultation Note Date: 2019-03-14   Patient Name: Marie Malone  DOB: 1951/04/26  MRN: [id number]  Age / Sex: 68 y.o., female  PCP: Marie Alar, NP Referring Physician: No att. providers found  Reason for Consultation: Establishing goals of care and Psychosocial/spiritual support  HPI/Patient Profile: 67 y.o. female  with past medical history of  PBC, CAD s/p CABG, Afib, MVR on chronic coumadin  who was admitted on 01/26/2019 with left lower extremity cellulitis and shortness of breath.  On admission she required BiPAP and had acute kidney failure with non anion gap metabolic acidosis.  She had and extended hospital stay that appeared to climax with a rapid response last night.  The patient had sudden onset hypoxic respiratory failure.  She was unable to speak or follow commands.  She had vaginal bleeding.  She continued to deteriorate rapidly and was made "comfort care" by [facility] on-call NP.    Clinical Assessment and Goals of Care:  I have reviewed medical records including EPIC notes, labs and imaging, received report from Marie Newcomer, NP and bedside RN, assessed the patient and then met at the bedside along with her sister and daughter  to discuss prognosis, confirm GOC, and EOL wishes.  Marie Malone was actively dying on my exam but she was moaning, tachycardic and appeared uncomfortable.  Her daughter and sister told me she had been that way all night.  They were distressed.  I confirmed that they understood she was dying and would not survive the hospitalization.  They added on another sister, Marie Malone, to allow her to participate in the conversation.  Their goal for Marie Malone was only comfort.  They felt a chaplain would provide comfort.   We talked about changing her medications to focus on comfort and weaning from the oxygen as it was no longer beneficial to her.     I explained that her  prognosis was very short - minutes to 24 hours, but that in reality her life was in God's hands. The family cried and indicated understanding.  Chaplain arrived and we prayed together.     Primary Decision Maker:  NEXT OF KIN    SUMMARY OF RECOMMENDATIONS    Orders changed to reflect comfort. Dilaudid gtt started at low dose.  Bolus given.  Code Status/Advance Care Planning: DNR  Additional Recommendations (Limitations, Scope, Preferences):  Full Comfort Care  Palliative Prophylaxis:   Frequent Pain Assessment  Psycho-social/Spiritual:   Desire for further Chaplaincy support: yes appreciated.  Prognosis:  Minutes to hours    Discharge Planning: Anticipated Hospital Death      Primary Diagnoses: Present on Admission: . Acute respiratory failure with hypoxia ([place]) . Pulmonary hypertension ([place]) . Paroxysmal atrial fibrillation ([facility]) . Essential hypertension . Acute on chronic systolic CHF (congestive heart failure) ([place]) . Atrial fibrillation, chronic ([place]) . Cellulitis of left leg . Acute renal failure (Marie Malone) . Primary biliary cirrhosis ([place]) . Chest pain at rest   I have reviewed the medical record, interviewed the patient and family, and  examined the patient. The following aspects are pertinent.  Past Medical History:  Diagnosis Date  . Atrial fibrillation ([place])   . Blood transfusion without reported diagnosis 05/2016  . Chronic diastolic CHF (congestive heart failure) ([place])   . Clotting disorder ([place]) 2014   placed on Warfarin  . Complication of anesthesia    difficult to wake up from anesthesia  . Coronary artery disease involving native coronary artery without angina pectoris 03/26/2016   100% chronic occlusion of RCA  . Dyspnea   . Heart murmur   . History of rheumatic fever    as a child  . Hyperlipidemia   . Hypertension   . Mitral stenosis   . OSA (obstructive sleep apnea)    mild per sleep study 2008  . Paroxysmal atrial fibrillation  ([facility])   . Pneumonia    double pneumonia once  . Pulmonary hypertension ([place])   . Rheumatic mitral regurgitation   . S/P balloon mitral valvuloplasty 2008   . 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 S/P mitral valve replacement with mechanical valve 05/28/2016   29 mm Sorin Carbomedics Optiform bileaflet mechanical prosthetic valve  . Stroke [facility])    x2   Social History   Socioeconomic History  . Marital status: Married    Spouse name: Not on file  . Number of children: 2  . Years of education: Associates  . Highest education level: Not on file  Occupational History  . Not on file  Social Needs  . Financial resource strain: Not on file  . Food insecurity    Worry: Not on file    Inability: Not on file  . Transportation needs    Medical: Not on file    Non-medical: Not on file  Tobacco Use  . Smoking status: Former Smoker    Packs/day: 1.00    Years: 5.00    Pack years: 5.00    Types: Cigarettes    Start date: 07/15/1970    Quit date: 05/11/1975    Years since quitting: 43.8  . Smokeless tobacco: Never Used  . Tobacco comment: quit smoking 36 years ago  Substance and Sexual Activity  . Alcohol use: No    Alcohol/week: 0.0 standard drinks  . Drug use: No  . Sexual activity: Not on file  Lifestyle  . Physical activity    Days per week: Not on file    Minutes per session: Not on file  . Stress: Not on file  Relationships  . Social Herbalist on phone: Not on file    Gets together: Not on file    Attends religious service: Not on file    Active member of club or organization: Not on file    Attends meetings of clubs or organizations: Not on file    Relationship status: Not on file  Other Topics Concern  . Not on file  Social History Narrative   Retired Archivist   Married   She has 2 grown children-   Daughter lives in [place]   Son lives in [place]   6 grandchildren    Family History  Problem Relation Age  of Onset  . Diabetes Mother   . Stroke Mother   . Diabetes Father   . Heart disease Father        CHF  . Cancer Father        kidney  . Diabetes Sister   .  Diabetes Brother   . Hypertension Daughter   . Colon polyps Neg Hx   . Colon cancer Neg Hx   . Esophageal cancer Neg Hx   . Rectal cancer Neg Hx   . Stomach cancer Neg Hx    Scheduled Meds: . Chlorhexidine Gluconate Cloth  6 each Topical Q0600  . [START ON 02/22/2019] darbepoetin (ARANESP) injection - DIALYSIS  60 mcg Subcutaneous Q7 days   Continuous Infusions: . HYDROmorphone Stopped (2019/02/24 0908)   PRN Meds:.acetaminophen **OR** acetaminophen, antiseptic oral rinse, glycopyrrolate **OR** glycopyrrolate **OR** glycopyrrolate, haloperidol **OR** haloperidol **OR** haloperidol lactate, HYDROmorphone, lidocaine (PF), LORazepam, ondansetron **OR** ondansetron (ZOFRAN) IV, polyvinyl alcohol Allergies  Allergen Reactions  . No Known Allergies    Review of Systems patient unable   Physical Exam  Ill appearing female, NRB mask in place.  Daughter/ sister at bedside.  Patient moaning with discomfort. Actively dying CV tachycardic resp no increased work of breathing. Abdomen soft, nt, nd   Vital Signs: BP 112/61   Pulse (!) 109   Temp 98 F (36.7 C) (Axillary)   Resp 15   Ht 5' 2"  (1.575 m)   Wt 71 kg   SpO2 (!) 57%   BMI 28.63 kg/m  Pain Scale: Faces   Pain Score: Asleep   SpO2: SpO2: (!) 57 % O2 Device:SpO2: (!) 57 % O2 Flow Rate: .O2 Flow Rate (L/min): 6 L/min  IO: Intake/output summary:   Intake/Output Summary (Last 24 hours) at 02/24/19 1308 Last data filed at 02-24-19 1000 Gross per 24 hour  Intake 118.08 ml  Output 235 ml  Net -116.92 ml    LBM: Last BM Date: 02/17/19 Baseline Weight: Weight: 62.7 kg Most recent weight: Weight: 71 kg     Palliative Assessment/Data: 10%     Time In: 7:50 Time Out: 8:40 Time Total: 50 min Visit consisted of counseling and education dealing with the  complex and emotionally intense issues surrounding the need for palliative care and symptom management in the setting of serious and potentially life-threatening illness. Greater than 50%  of this time was spent counseling and coordinating care related to the above assessment and plan.  Signed by: Florentina Jenny, PA-C Palliative Medicine Pager: [phone]  Please contact Palliative Medicine Team phone at [phone] for questions and concerns.  For individual provider: See Shea Evans

## 2019-03-11 NOTE — Progress Notes (Signed)
During early morning chart review in preparation for consultation rapid response and change in patient's condition noted.  Discussed at length with bedside RN this am and paged NP Blount to attempt to ensure that the patient is truly comfort care and that death is imminent and expected by the family.  NP Blount did confirm that the patient is comfort measures only and the family expects that her death is imminent.  Will change orders to be more comfort focused.  My concern is that the patient may pass away before I can see her.  Family has requested that she be comfortable and  I want to ensure she is comfortable.    Will use low dose dilaudid rather than morphine in this patient with renal failure in order to attempt to avoid adverse side effects.  Florentina Jenny, PA-C Palliative Medicine Pager: [phone]  No charge note

## 2019-03-11 NOTE — Progress Notes (Signed)
Chaplain responded to end of life care / support for pt family. Daughter and Sister were at the bedside. Chaplain offered conversation and prayer. Chaplain handed this consult off to the on-coming on-call chaplain for follow up. Chaplain remains available per request. Time spent 30 mins.  Chaplain Resident, Evelene Croon, M Div Pager # [phone] personal Pager # [phone] on-call

## 2019-03-11 NOTE — Progress Notes (Signed)
Post mortem care completed, message left for patient placement that patient being transported to morgue and family to notify them of funeral home once decision is made.

## 2019-03-11 DEATH — deceased
